# Patient Record
Sex: Female | Born: 1955 | Race: Black or African American | Hispanic: No | Marital: Married | Smoking: Former smoker
Health system: Southern US, Community
[De-identification: ages and names within clinical notes are randomized; demographics above are authoritative.]

## PROBLEM LIST (undated history)

## (undated) DIAGNOSIS — D649 Anemia, unspecified: Secondary | ICD-10-CM

## (undated) DIAGNOSIS — Z992 Dependence on renal dialysis: Secondary | ICD-10-CM

## (undated) DIAGNOSIS — T829XXA Unspecified complication of cardiac and vascular prosthetic device, implant and graft, initial encounter: Secondary | ICD-10-CM

## (undated) DIAGNOSIS — I509 Heart failure, unspecified: Secondary | ICD-10-CM

## (undated) DIAGNOSIS — K922 Gastrointestinal hemorrhage, unspecified: Secondary | ICD-10-CM

## (undated) DIAGNOSIS — J40 Bronchitis, not specified as acute or chronic: Secondary | ICD-10-CM

## (undated) DIAGNOSIS — E785 Hyperlipidemia, unspecified: Secondary | ICD-10-CM

## (undated) DIAGNOSIS — E669 Obesity, unspecified: Secondary | ICD-10-CM

## (undated) DIAGNOSIS — J811 Chronic pulmonary edema: Secondary | ICD-10-CM

## (undated) DIAGNOSIS — E119 Type 2 diabetes mellitus without complications: Secondary | ICD-10-CM

## (undated) DIAGNOSIS — F32A Depression, unspecified: Secondary | ICD-10-CM

## (undated) DIAGNOSIS — R4182 Altered mental status, unspecified: Secondary | ICD-10-CM

## (undated) DIAGNOSIS — G4730 Sleep apnea, unspecified: Secondary | ICD-10-CM

## (undated) DIAGNOSIS — J9690 Respiratory failure, unspecified, unspecified whether with hypoxia or hypercapnia: Secondary | ICD-10-CM

## (undated) DIAGNOSIS — S0083XA Contusion of other part of head, initial encounter: Secondary | ICD-10-CM

## (undated) DIAGNOSIS — R519 Headache, unspecified: Secondary | ICD-10-CM

## (undated) DIAGNOSIS — I10 Essential (primary) hypertension: Secondary | ICD-10-CM

## (undated) DIAGNOSIS — N289 Disorder of kidney and ureter, unspecified: Secondary | ICD-10-CM

## (undated) DIAGNOSIS — N189 Chronic kidney disease, unspecified: Secondary | ICD-10-CM

## (undated) DIAGNOSIS — F329 Major depressive disorder, single episode, unspecified: Secondary | ICD-10-CM

## (undated) DIAGNOSIS — K219 Gastro-esophageal reflux disease without esophagitis: Secondary | ICD-10-CM

## (undated) DIAGNOSIS — M1990 Unspecified osteoarthritis, unspecified site: Secondary | ICD-10-CM

## (undated) DIAGNOSIS — J449 Chronic obstructive pulmonary disease, unspecified: Secondary | ICD-10-CM

## (undated) DIAGNOSIS — R51 Headache: Secondary | ICD-10-CM

## (undated) HISTORY — DX: Chronic kidney disease, unspecified: N18.9

## (undated) HISTORY — DX: Chronic pulmonary edema: J81.1

## (undated) HISTORY — DX: Gastrointestinal hemorrhage, unspecified: K92.2

## (undated) HISTORY — DX: Hyperlipidemia, unspecified: E78.5

## (undated) HISTORY — DX: Respiratory failure, unspecified, unspecified whether with hypoxia or hypercapnia: J96.90

## (undated) HISTORY — DX: Contusion of other part of head, initial encounter: S00.83XA

## (undated) HISTORY — DX: Chronic obstructive pulmonary disease, unspecified: J44.9

## (undated) HISTORY — DX: Depression, unspecified: F32.A

## (undated) HISTORY — DX: Altered mental status, unspecified: R41.82

## (undated) HISTORY — DX: Dependence on renal dialysis: Z99.2

## (undated) HISTORY — PX: DIALYSIS FISTULA CREATION: SHX611

## (undated) HISTORY — DX: Essential (primary) hypertension: I10

## (undated) HISTORY — PX: ABDOMINAL HYSTERECTOMY: SHX81

## (undated) HISTORY — DX: Major depressive disorder, single episode, unspecified: F32.9

## (undated) HISTORY — DX: Type 2 diabetes mellitus without complications: E11.9

---

## 1990-02-14 DIAGNOSIS — N859 Noninflammatory disorder of uterus, unspecified: Secondary | ICD-10-CM

## 2004-09-25 ENCOUNTER — Emergency Department: Payer: Self-pay | Admitting: Emergency Medicine

## 2004-12-05 ENCOUNTER — Emergency Department: Payer: Self-pay | Admitting: Emergency Medicine

## 2008-04-20 ENCOUNTER — Emergency Department: Payer: Self-pay | Admitting: Emergency Medicine

## 2009-06-21 ENCOUNTER — Emergency Department: Payer: Self-pay | Admitting: Emergency Medicine

## 2009-06-29 ENCOUNTER — Emergency Department: Payer: Self-pay | Admitting: Emergency Medicine

## 2009-09-21 DIAGNOSIS — E119 Type 2 diabetes mellitus without complications: Secondary | ICD-10-CM

## 2009-09-21 HISTORY — DX: Type 2 diabetes mellitus without complications: E11.9

## 2009-10-18 DIAGNOSIS — Z87891 Personal history of nicotine dependence: Secondary | ICD-10-CM

## 2009-10-18 DIAGNOSIS — N189 Chronic kidney disease, unspecified: Secondary | ICD-10-CM

## 2009-11-26 HISTORY — PX: COLONOSCOPY: SHX174

## 2010-07-20 ENCOUNTER — Emergency Department: Payer: Self-pay | Admitting: Emergency Medicine

## 2010-10-13 ENCOUNTER — Inpatient Hospital Stay: Payer: Self-pay | Admitting: Internal Medicine

## 2010-10-13 ENCOUNTER — Ambulatory Visit: Payer: Self-pay | Admitting: Cardiovascular Disease

## 2011-03-14 ENCOUNTER — Emergency Department: Payer: Self-pay | Admitting: Emergency Medicine

## 2011-09-09 ENCOUNTER — Inpatient Hospital Stay: Payer: Self-pay | Admitting: Specialist

## 2012-11-26 DIAGNOSIS — Z992 Dependence on renal dialysis: Secondary | ICD-10-CM

## 2012-11-26 HISTORY — DX: Dependence on renal dialysis: Z99.2

## 2013-02-27 ENCOUNTER — Inpatient Hospital Stay: Payer: Self-pay | Admitting: Internal Medicine

## 2013-02-27 LAB — CBC WITH DIFFERENTIAL/PLATELET
Basophil #: 0 10*3/uL (ref 0.0–0.1)
Basophil %: 0.5 %
Eosinophil %: 2.3 %
Lymphocyte #: 1.5 10*3/uL (ref 1.0–3.6)
MCH: 30.1 pg (ref 26.0–34.0)
MCV: 90 fL (ref 80–100)
Monocyte #: 0.7 x10 3/mm (ref 0.2–0.9)
Monocyte %: 8.2 %
Neutrophil %: 70.4 %
RBC: 2.66 10*6/uL — ABNORMAL LOW (ref 3.80–5.20)
RDW: 13.5 % (ref 11.5–14.5)
WBC: 8.3 10*3/uL (ref 3.6–11.0)

## 2013-02-27 LAB — TROPONIN I: Troponin-I: 0.04 ng/mL

## 2013-02-27 LAB — IRON AND TIBC
Iron Bind.Cap.(Total): 331 ug/dL (ref 250–450)
Iron: 35 ug/dL — ABNORMAL LOW (ref 50–170)
Unbound Iron-Bind.Cap.: 296 ug/dL

## 2013-02-27 LAB — TSH: Thyroid Stimulating Horm: 0.186 u[IU]/mL — ABNORMAL LOW

## 2013-02-27 LAB — COMPREHENSIVE METABOLIC PANEL
Albumin: 3.6 g/dL (ref 3.4–5.0)
Anion Gap: 11 (ref 7–16)
BUN: 74 mg/dL — ABNORMAL HIGH (ref 7–18)
Calcium, Total: 5.7 mg/dL — CL (ref 8.5–10.1)
Chloride: 101 mmol/L (ref 98–107)
Co2: 27 mmol/L (ref 21–32)
EGFR (Non-African Amer.): 4 — ABNORMAL LOW
Glucose: 104 mg/dL — ABNORMAL HIGH (ref 65–99)
Osmolality: 300 (ref 275–301)
SGOT(AST): 19 U/L (ref 15–37)
Total Protein: 7.5 g/dL (ref 6.4–8.2)

## 2013-02-27 LAB — LIPID PANEL
Cholesterol: 180 mg/dL (ref 0–200)
HDL Cholesterol: 37 mg/dL — ABNORMAL LOW (ref 40–60)
Ldl Cholesterol, Calc: 123 mg/dL — ABNORMAL HIGH (ref 0–100)
Triglycerides: 101 mg/dL (ref 0–200)
VLDL Cholesterol, Calc: 20 mg/dL (ref 5–40)

## 2013-02-27 LAB — URINALYSIS, COMPLETE
Ketone: NEGATIVE
Protein: 30
Squamous Epithelial: 2
WBC UR: 27 /HPF (ref 0–5)

## 2013-02-27 LAB — PHOSPHORUS: Phosphorus: 5.5 mg/dL — ABNORMAL HIGH (ref 2.5–4.9)

## 2013-02-27 LAB — FOLATE: Folic Acid: 5.6 ng/mL (ref 3.1–100.0)

## 2013-02-27 LAB — FERRITIN: Ferritin (ARMC): 51 ng/mL (ref 8–388)

## 2013-02-28 LAB — RENAL FUNCTION PANEL
Albumin: 3.5 g/dL (ref 3.4–5.0)
Anion Gap: 8 (ref 7–16)
Calcium, Total: 7.1 mg/dL — ABNORMAL LOW (ref 8.5–10.1)
Co2: 28 mmol/L (ref 21–32)
EGFR (African American): 6 — ABNORMAL LOW
EGFR (Non-African Amer.): 5 — ABNORMAL LOW
Potassium: 3.3 mmol/L — ABNORMAL LOW (ref 3.5–5.1)

## 2013-02-28 LAB — CBC WITH DIFFERENTIAL/PLATELET
Basophil %: 0.7 %
Eosinophil #: 0.2 10*3/uL (ref 0.0–0.7)
Eosinophil #: 0.1 10*3/uL (ref 0.0–0.7)
Eosinophil %: 2.1 %
HCT: 24.6 % — ABNORMAL LOW (ref 35.0–47.0)
HCT: 22.9 % — ABNORMAL LOW (ref 35.0–47.0)
HGB: 7.6 g/dL — ABNORMAL LOW (ref 12.0–16.0)
Lymphocyte #: 1.1 10*3/uL (ref 1.0–3.6)
Lymphocyte %: 13.9 %
Lymphocyte %: 15.2 %
MCH: 30.4 pg (ref 26.0–34.0)
MCH: 30.2 pg (ref 26.0–34.0)
MCHC: 33.1 g/dL (ref 32.0–36.0)
MCV: 92 fL (ref 80–100)
Monocyte #: 0.6 x10 3/mm (ref 0.2–0.9)
Neutrophil #: 5.2 10*3/uL (ref 1.4–6.5)
Neutrophil #: 5.2 10*3/uL (ref 1.4–6.5)
Neutrophil %: 73.8 %
Neutrophil %: 74.1 %
Platelet: 285 10*3/uL (ref 150–440)
Platelet: 261 10*3/uL (ref 150–440)
RBC: 2.51 10*6/uL — ABNORMAL LOW (ref 3.80–5.20)
RDW: 13.6 % (ref 11.5–14.5)
WBC: 7 10*3/uL (ref 3.6–11.0)
WBC: 7 10*3/uL (ref 3.6–11.0)

## 2013-02-28 LAB — LIPID PANEL
Cholesterol: 192 mg/dL (ref 0–200)
HDL Cholesterol: 40 mg/dL (ref 40–60)
Ldl Cholesterol, Calc: 132 mg/dL — ABNORMAL HIGH (ref 0–100)
Triglycerides: 98 mg/dL (ref 0–200)
VLDL Cholesterol, Calc: 20 mg/dL (ref 5–40)

## 2013-02-28 LAB — BASIC METABOLIC PANEL
Anion Gap: 8 (ref 7–16)
BUN: 53 mg/dL — ABNORMAL HIGH (ref 7–18)
Calcium, Total: 7.2 mg/dL — ABNORMAL LOW (ref 8.5–10.1)
Co2: 28 mmol/L (ref 21–32)
Creatinine: 7.24 mg/dL — ABNORMAL HIGH (ref 0.60–1.30)
EGFR (Non-African Amer.): 6 — ABNORMAL LOW
Glucose: 102 mg/dL — ABNORMAL HIGH (ref 65–99)
Osmolality: 296 (ref 275–301)
Potassium: 3.6 mmol/L (ref 3.5–5.1)
Sodium: 141 mmol/L (ref 136–145)

## 2013-02-28 LAB — HEMOGLOBIN A1C: Hemoglobin A1C: 5.4 % (ref 4.2–6.3)

## 2013-03-01 LAB — CBC WITH DIFFERENTIAL/PLATELET
Basophil #: 0.1 10*3/uL (ref 0.0–0.1)
Basophil %: 1 %
Eosinophil #: 0.2 10*3/uL (ref 0.0–0.7)
HGB: 7.6 g/dL — ABNORMAL LOW (ref 12.0–16.0)
Lymphocyte %: 24.4 %
MCHC: 33.2 g/dL (ref 32.0–36.0)
MCV: 92 fL (ref 80–100)
Monocyte #: 1 x10 3/mm — ABNORMAL HIGH (ref 0.2–0.9)
Neutrophil %: 60 %
WBC: 8.1 10*3/uL (ref 3.6–11.0)

## 2013-03-01 LAB — RENAL FUNCTION PANEL
BUN: 31 mg/dL — ABNORMAL HIGH (ref 7–18)
Chloride: 104 mmol/L (ref 98–107)
Co2: 29 mmol/L (ref 21–32)
Creatinine: 5.3 mg/dL — ABNORMAL HIGH (ref 0.60–1.30)
EGFR (Non-African Amer.): 8 — ABNORMAL LOW
Glucose: 101 mg/dL — ABNORMAL HIGH (ref 65–99)
Osmolality: 286 (ref 275–301)
Sodium: 140 mmol/L (ref 136–145)

## 2013-03-02 LAB — RENAL FUNCTION PANEL
Albumin: 3.4 g/dL (ref 3.4–5.0)
Anion Gap: 5 — ABNORMAL LOW (ref 7–16)
BUN: 41 mg/dL — ABNORMAL HIGH (ref 7–18)
Calcium, Total: 7.5 mg/dL — ABNORMAL LOW (ref 8.5–10.1)
Chloride: 106 mmol/L (ref 98–107)
Co2: 29 mmol/L (ref 21–32)
Creatinine: 7.03 mg/dL — ABNORMAL HIGH (ref 0.60–1.30)
EGFR (African American): 7 — ABNORMAL LOW
Phosphorus: 4.5 mg/dL (ref 2.5–4.9)
Potassium: 3.9 mmol/L (ref 3.5–5.1)
Sodium: 140 mmol/L (ref 136–145)

## 2013-03-03 LAB — PROTIME-INR: Prothrombin Time: 13.9 secs (ref 11.5–14.7)

## 2013-03-23 ENCOUNTER — Ambulatory Visit: Payer: Self-pay | Admitting: Vascular Surgery

## 2013-03-23 LAB — BASIC METABOLIC PANEL WITH GFR
Anion Gap: 10 (ref 7–16)
BUN: 32 mg/dL — ABNORMAL HIGH (ref 7–18)
Calcium, Total: 8.3 mg/dL — ABNORMAL LOW (ref 8.5–10.1)
Chloride: 101 mmol/L (ref 98–107)
Co2: 26 mmol/L (ref 21–32)
Creatinine: 7.5 mg/dL — ABNORMAL HIGH (ref 0.60–1.30)
EGFR (African American): 6 — ABNORMAL LOW
EGFR (Non-African Amer.): 5 — ABNORMAL LOW
Glucose: 102 mg/dL — ABNORMAL HIGH (ref 65–99)
Osmolality: 281 (ref 275–301)
Potassium: 3.7 mmol/L (ref 3.5–5.1)
Sodium: 137 mmol/L (ref 136–145)

## 2013-03-23 LAB — CBC
HGB: 9.3 g/dL — ABNORMAL LOW (ref 12.0–16.0)
MCH: 30 pg (ref 26.0–34.0)
MCHC: 33 g/dL (ref 32.0–36.0)
Platelet: 361 10*3/uL (ref 150–440)
RDW: 14.1 % (ref 11.5–14.5)
WBC: 6.1 10*3/uL (ref 3.6–11.0)

## 2013-03-27 ENCOUNTER — Ambulatory Visit: Payer: Self-pay | Admitting: Vascular Surgery

## 2013-05-04 ENCOUNTER — Emergency Department: Payer: Self-pay | Admitting: Emergency Medicine

## 2013-05-04 LAB — CBC
MCH: 30.6 pg (ref 26.0–34.0)
Platelet: 323 10*3/uL (ref 150–440)
RDW: 15.5 % — ABNORMAL HIGH (ref 11.5–14.5)
WBC: 6.7 10*3/uL (ref 3.6–11.0)

## 2013-05-04 LAB — COMPREHENSIVE METABOLIC PANEL
Alkaline Phosphatase: 253 U/L — ABNORMAL HIGH (ref 50–136)
Anion Gap: 11 (ref 7–16)
BUN: 58 mg/dL — ABNORMAL HIGH (ref 7–18)
Bilirubin,Total: 0.4 mg/dL (ref 0.2–1.0)
Calcium, Total: 8.2 mg/dL — ABNORMAL LOW (ref 8.5–10.1)
Chloride: 104 mmol/L (ref 98–107)
EGFR (African American): 6 — ABNORMAL LOW
EGFR (Non-African Amer.): 6 — ABNORMAL LOW
Osmolality: 295 (ref 275–301)
SGOT(AST): 16 U/L (ref 15–37)
Total Protein: 7 g/dL (ref 6.4–8.2)

## 2013-05-04 LAB — TROPONIN I
Troponin-I: 0.02 ng/mL
Troponin-I: 0.02 ng/mL

## 2013-05-04 LAB — URINALYSIS, COMPLETE
Ketone: NEGATIVE
Ph: 7 (ref 4.5–8.0)
Protein: 30
Squamous Epithelial: 14

## 2013-05-11 ENCOUNTER — Other Ambulatory Visit: Payer: Self-pay | Admitting: Internal Medicine

## 2013-05-11 LAB — LIPASE, BLOOD: Lipase: 307 U/L (ref 73–393)

## 2013-05-15 ENCOUNTER — Ambulatory Visit: Payer: Self-pay | Admitting: Physician Assistant

## 2013-06-02 LAB — COMPREHENSIVE METABOLIC PANEL
Alkaline Phosphatase: 189 U/L — ABNORMAL HIGH (ref 50–136)
Anion Gap: 8 (ref 7–16)
Bilirubin,Total: 0.6 mg/dL (ref 0.2–1.0)
Creatinine: 4 mg/dL — ABNORMAL HIGH (ref 0.60–1.30)
EGFR (African American): 13 — ABNORMAL LOW
Osmolality: 274 (ref 275–301)
SGOT(AST): 31 U/L (ref 15–37)
SGPT (ALT): 20 U/L (ref 12–78)
Sodium: 136 mmol/L (ref 136–145)
Total Protein: 8.1 g/dL (ref 6.4–8.2)

## 2013-06-02 LAB — CK TOTAL AND CKMB (NOT AT ARMC)
CK, Total: 49 U/L (ref 21–215)
CK-MB: 1.1 ng/mL (ref 0.5–3.6)

## 2013-06-02 LAB — CBC
HCT: 36.6 % (ref 35.0–47.0)
MCH: 29.5 pg (ref 26.0–34.0)
MCHC: 31.4 g/dL — ABNORMAL LOW (ref 32.0–36.0)
MCV: 94 fL (ref 80–100)
Platelet: 279 10*3/uL (ref 150–440)
RBC: 3.91 10*6/uL (ref 3.80–5.20)
WBC: 8.7 10*3/uL (ref 3.6–11.0)

## 2013-06-02 LAB — TROPONIN I: Troponin-I: 0.02 ng/mL

## 2013-06-02 LAB — PRO B NATRIURETIC PEPTIDE: B-Type Natriuretic Peptide: 48946 pg/mL — ABNORMAL HIGH (ref 0–125)

## 2013-06-03 ENCOUNTER — Inpatient Hospital Stay: Payer: Self-pay | Admitting: Family Medicine

## 2013-06-03 DIAGNOSIS — I509 Heart failure, unspecified: Secondary | ICD-10-CM

## 2013-06-04 LAB — BASIC METABOLIC PANEL
Co2: 25 mmol/L (ref 21–32)
Creatinine: 7.85 mg/dL — ABNORMAL HIGH (ref 0.60–1.30)
EGFR (Non-African Amer.): 5 — ABNORMAL LOW
Glucose: 143 mg/dL — ABNORMAL HIGH (ref 65–99)
Osmolality: 291 (ref 275–301)
Sodium: 136 mmol/L (ref 136–145)

## 2013-06-04 LAB — PRO B NATRIURETIC PEPTIDE: B-Type Natriuretic Peptide: 33950 pg/mL — ABNORMAL HIGH (ref 0–125)

## 2013-06-04 LAB — URINALYSIS, COMPLETE
Bilirubin,UR: NEGATIVE
Glucose,UR: >=500 mg/dL (ref 0–75)
Nitrite: NEGATIVE
Ph: 6 (ref 4.5–8.0)
RBC,UR: 6 /HPF (ref 0–5)
Specific Gravity: 1.006 (ref 1.003–1.030)

## 2013-06-04 LAB — PHOSPHORUS: Phosphorus: 3.3 mg/dL (ref 2.5–4.9)

## 2013-06-07 ENCOUNTER — Inpatient Hospital Stay: Payer: Self-pay | Admitting: Internal Medicine

## 2013-06-07 LAB — BASIC METABOLIC PANEL
Anion Gap: 5 — ABNORMAL LOW (ref 7–16)
BUN: 51 mg/dL — ABNORMAL HIGH (ref 7–18)
EGFR (African American): 7 — ABNORMAL LOW
EGFR (Non-African Amer.): 6 — ABNORMAL LOW
Glucose: 145 mg/dL — ABNORMAL HIGH (ref 65–99)
Osmolality: 294 (ref 275–301)
Sodium: 139 mmol/L (ref 136–145)

## 2013-06-07 LAB — COMPREHENSIVE METABOLIC PANEL
Alkaline Phosphatase: 228 U/L — ABNORMAL HIGH (ref 50–136)
Anion Gap: 13 (ref 7–16)
BUN: 45 mg/dL — ABNORMAL HIGH (ref 7–18)
Bilirubin,Total: 0.4 mg/dL (ref 0.2–1.0)
Calcium, Total: 7.7 mg/dL — ABNORMAL LOW (ref 8.5–10.1)
Chloride: 101 mmol/L (ref 98–107)
Creatinine: 6.06 mg/dL — ABNORMAL HIGH (ref 0.60–1.30)
EGFR (African American): 8 — ABNORMAL LOW
Potassium: 7.7 mmol/L (ref 3.5–5.1)
SGOT(AST): 46 U/L — ABNORMAL HIGH (ref 15–37)
SGPT (ALT): 33 U/L (ref 12–78)
Total Protein: 6.9 g/dL (ref 6.4–8.2)

## 2013-06-07 LAB — CBC WITH DIFFERENTIAL/PLATELET
Basophil #: 0.1 10*3/uL (ref 0.0–0.1)
Basophil %: 0.7 %
Eosinophil %: 0 %
HCT: 34.1 % — ABNORMAL LOW (ref 35.0–47.0)
HGB: 11 g/dL — ABNORMAL LOW (ref 12.0–16.0)
Lymphocyte #: 0.8 10*3/uL — ABNORMAL LOW (ref 1.0–3.6)
Lymphocyte %: 7.1 %
MCHC: 32.2 g/dL (ref 32.0–36.0)
MCV: 94 fL (ref 80–100)
Monocyte #: 0.7 x10 3/mm (ref 0.2–0.9)
Monocyte %: 5.9 %
Platelet: 278 10*3/uL (ref 150–440)
RBC: 3.62 10*6/uL — ABNORMAL LOW (ref 3.80–5.20)
RDW: 16.6 % — ABNORMAL HIGH (ref 11.5–14.5)

## 2013-06-07 LAB — CBC
HCT: 37.4 % (ref 35.0–47.0)
HGB: 12 g/dL (ref 12.0–16.0)
MCH: 30.4 pg (ref 26.0–34.0)
MCV: 95 fL (ref 80–100)
Platelet: 258 10*3/uL (ref 150–440)
RBC: 3.95 10*6/uL (ref 3.80–5.20)
RDW: 16.7 % — ABNORMAL HIGH (ref 11.5–14.5)

## 2013-06-07 LAB — PHOSPHORUS: Phosphorus: 3.8 mg/dL (ref 2.5–4.9)

## 2013-06-24 ENCOUNTER — Ambulatory Visit: Payer: Self-pay | Admitting: Vascular Surgery

## 2013-06-24 LAB — COMPREHENSIVE METABOLIC PANEL
Alkaline Phosphatase: 154 U/L — ABNORMAL HIGH (ref 50–136)
Anion Gap: 6 — ABNORMAL LOW (ref 7–16)
Bilirubin,Total: 0.4 mg/dL (ref 0.2–1.0)
Calcium, Total: 8.1 mg/dL — ABNORMAL LOW (ref 8.5–10.1)
Co2: 30 mmol/L (ref 21–32)
Creatinine: 6.5 mg/dL — ABNORMAL HIGH (ref 0.60–1.30)
EGFR (Non-African Amer.): 6 — ABNORMAL LOW
Glucose: 98 mg/dL (ref 65–99)
Potassium: 4.3 mmol/L (ref 3.5–5.1)
SGOT(AST): 14 U/L — ABNORMAL LOW (ref 15–37)
SGPT (ALT): 13 U/L (ref 12–78)
Sodium: 139 mmol/L (ref 136–145)
Total Protein: 6.6 g/dL (ref 6.4–8.2)

## 2013-06-24 LAB — AMYLASE: Amylase: 68 U/L (ref 25–115)

## 2013-06-24 LAB — LIPASE, BLOOD: Lipase: 242 U/L (ref 73–393)

## 2013-09-09 ENCOUNTER — Ambulatory Visit: Payer: Self-pay | Admitting: Vascular Surgery

## 2013-09-14 ENCOUNTER — Emergency Department: Payer: Self-pay | Admitting: Emergency Medicine

## 2013-09-14 LAB — LIPASE, BLOOD: Lipase: 330 U/L (ref 73–393)

## 2013-09-14 LAB — HEPATIC FUNCTION PANEL A (ARMC)
Alkaline Phosphatase: 150 U/L — ABNORMAL HIGH (ref 50–136)
Bilirubin,Total: 0.5 mg/dL (ref 0.2–1.0)
SGOT(AST): 23 U/L (ref 15–37)
SGPT (ALT): 15 U/L (ref 12–78)

## 2013-09-14 LAB — BASIC METABOLIC PANEL
Anion Gap: 7 (ref 7–16)
BUN: 49 mg/dL — ABNORMAL HIGH (ref 7–18)
Chloride: 95 mmol/L — ABNORMAL LOW (ref 98–107)
Creatinine: 8.35 mg/dL — ABNORMAL HIGH (ref 0.60–1.30)
EGFR (Non-African Amer.): 5 — ABNORMAL LOW
Glucose: 108 mg/dL — ABNORMAL HIGH (ref 65–99)
Potassium: 4.7 mmol/L (ref 3.5–5.1)
Sodium: 136 mmol/L (ref 136–145)

## 2013-09-14 LAB — CBC
HGB: 11.7 g/dL — ABNORMAL LOW (ref 12.0–16.0)
MCHC: 34.5 g/dL (ref 32.0–36.0)
MCV: 96 fL (ref 80–100)
RBC: 3.54 10*6/uL — ABNORMAL LOW (ref 3.80–5.20)
RDW: 14.8 % — ABNORMAL HIGH (ref 11.5–14.5)

## 2013-09-28 ENCOUNTER — Inpatient Hospital Stay: Payer: Self-pay | Admitting: Internal Medicine

## 2013-09-28 LAB — BASIC METABOLIC PANEL
BUN: 60 mg/dL — ABNORMAL HIGH (ref 7–18)
Chloride: 99 mmol/L (ref 98–107)
EGFR (Non-African Amer.): 4 — ABNORMAL LOW
Glucose: 95 mg/dL (ref 65–99)
Potassium: 5.3 mmol/L — ABNORMAL HIGH (ref 3.5–5.1)

## 2013-09-28 LAB — TROPONIN I: Troponin-I: 0.03 ng/mL

## 2013-09-28 LAB — CBC
MCH: 32.9 pg (ref 26.0–34.0)
MCHC: 34 g/dL (ref 32.0–36.0)
Platelet: 223 10*3/uL (ref 150–440)
RDW: 14.7 % — ABNORMAL HIGH (ref 11.5–14.5)
WBC: 10.9 10*3/uL (ref 3.6–11.0)

## 2013-09-29 LAB — CBC WITH DIFFERENTIAL/PLATELET
Basophil #: 0 10*3/uL (ref 0.0–0.1)
Basophil %: 0.4 %
Eosinophil %: 0 %
HGB: 11.5 g/dL — ABNORMAL LOW (ref 12.0–16.0)
Lymphocyte #: 0.4 10*3/uL — ABNORMAL LOW (ref 1.0–3.6)
Lymphocyte %: 4.1 %
MCH: 32.6 pg (ref 26.0–34.0)
MCHC: 33.4 g/dL (ref 32.0–36.0)
Monocyte #: 0.2 x10 3/mm (ref 0.2–0.9)
Monocyte %: 2.5 %
Neutrophil #: 8.2 10*3/uL — ABNORMAL HIGH (ref 1.4–6.5)
Neutrophil %: 93 %
Platelet: 238 10*3/uL (ref 150–440)
RBC: 3.51 10*6/uL — ABNORMAL LOW (ref 3.80–5.20)
WBC: 8.8 10*3/uL (ref 3.6–11.0)

## 2013-09-29 LAB — BASIC METABOLIC PANEL
Anion Gap: 6 — ABNORMAL LOW (ref 7–16)
Calcium, Total: 7.8 mg/dL — ABNORMAL LOW (ref 8.5–10.1)
Co2: 33 mmol/L — ABNORMAL HIGH (ref 21–32)
EGFR (African American): 8 — ABNORMAL LOW
EGFR (Non-African Amer.): 7 — ABNORMAL LOW
Glucose: 210 mg/dL — ABNORMAL HIGH (ref 65–99)
Osmolality: 288 (ref 275–301)

## 2013-09-30 LAB — PHOSPHORUS: Phosphorus: 5.3 mg/dL — ABNORMAL HIGH (ref 2.5–4.9)

## 2013-10-01 ENCOUNTER — Encounter: Payer: Self-pay | Admitting: *Deleted

## 2013-10-27 ENCOUNTER — Encounter: Payer: Self-pay | Admitting: General Surgery

## 2013-10-27 ENCOUNTER — Ambulatory Visit (INDEPENDENT_AMBULATORY_CARE_PROVIDER_SITE_OTHER): Payer: Medicare Other | Admitting: General Surgery

## 2013-10-27 VITALS — BP 150/78 | HR 70 | Resp 14 | Ht 65.0 in | Wt 153.0 lb

## 2013-10-27 DIAGNOSIS — B079 Viral wart, unspecified: Secondary | ICD-10-CM

## 2013-10-27 NOTE — Patient Instructions (Signed)
Patient to return to have a excision .

## 2013-10-27 NOTE — Progress Notes (Signed)
Patient ID: Michele Nash, female   DOB: [birth year], 57 y.o.   MRN: [id number]  Chief Complaint  Patient presents with  . Other    skin overgrowth    HPI Michele Nash is a 57 y.o. female who presents for an evaluation of a skin overgrowth on the rectum. The patient states she noticed this problem approximately 2 months ago. She states it has gotten larger. She has soreness in the area when she has to wipe a lot. Patient is currently on dialysis    HPI  Past Medical History  Diagnosis Date  . Chronic kidney disease   . COPD (chronic obstructive pulmonary disease)   . Hypertension   . Hyperlipidemia   . Depression   . Dialysis patient 2014    Past Surgical History  Procedure Laterality Date  . Abdominal hysterectomy    . Dialysis fistula creation    . Colonoscopy  2011    Family History  Problem Relation Age of Onset  . Heart disease Mother   . Cancer Father   . Cancer Sister     Social History History  Substance Use Topics  . Smoking status: Current Every Day Smoker -- 0.50 packs/day  . Smokeless tobacco: Not on file  . Alcohol Use: No    Allergies  Allergen Reactions  . Sulfa Antibiotics Swelling    Current Outpatient Prescriptions  Medication Sig Dispense Refill  . calcium carbonate (OS-CAL) 600 MG TABS tablet Take 600 mg by mouth 2 (two) times daily with a meal.      . carvedilol (COREG) 12.5 MG tablet Take 12.5 mg by mouth 2 (two) times daily with a meal.      . cinacalcet (SENSIPAR) 30 MG tablet Take 30 mg by mouth daily.      Marland Kitchen ezetimibe (ZETIA) 10 MG tablet Take 10 mg by mouth daily.      . fluticasone (VERAMYST) 27.5 MCG/SPRAY nasal spray Place 2 sprays into the nose daily.      . furosemide (LASIX) 40 MG tablet Take 40 mg by mouth.      . losartan (COZAAR) 100 MG tablet Take 100 mg by mouth daily.      Marland Kitchen omeprazole (PRILOSEC) 20 MG capsule Take 20 mg by mouth daily.      . sertraline (ZOLOFT) 50 MG tablet Take 50 mg by mouth daily.       No  current facility-administered medications for this visit.    Review of Systems Review of Systems  Constitutional: Negative.   Respiratory: Negative.   Cardiovascular: Negative.   Gastrointestinal: Negative.     Blood pressure 150/78, pulse 70, resp. rate 14, height 5\' 5"  (1.651 m), weight 153 lb (69.4 kg).  Physical Exam Physical Exam  Constitutional: She is oriented to person, place, and time. She appears well-developed and well-nourished.  Eyes: No scleral icterus.  Cardiovascular: Normal rate, regular rhythm and normal heart sounds.   Pulmonary/Chest: Breath sounds normal.  Abdominal: Soft. Normal appearance and bowel sounds are normal. There is no tenderness.  Genitourinary:  There are three warty skin tags-one 1cm and the other two  are 0.5 cm. No signs of infection.  Lymphadenopathy:    She has no cervical adenopathy.  Neurological: She is alert and oriented to person, place, and time.  Skin: Skin is warm and dry.    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 Reasons explained to her and she is agreeable. Also plan for anoscopy at same time.       Lilit Cinelli G 10/27/2013, 12:45 PM

## 2013-11-06 ENCOUNTER — Inpatient Hospital Stay: Payer: Self-pay | Admitting: Internal Medicine

## 2013-11-06 LAB — CBC WITH DIFFERENTIAL/PLATELET
Basophil %: 0.7 %
Eosinophil #: 0.2 10*3/uL (ref 0.0–0.7)
HCT: 38.5 % (ref 35.0–47.0)
Lymphocyte %: 14.5 %
MCHC: 32.6 g/dL (ref 32.0–36.0)
Monocyte #: 0.8 x10 3/mm (ref 0.2–0.9)
Monocyte %: 9.1 %
RDW: 15 % — ABNORMAL HIGH (ref 11.5–14.5)
WBC: 8.6 10*3/uL (ref 3.6–11.0)

## 2013-11-06 LAB — CK TOTAL AND CKMB (NOT AT ARMC)
CK, Total: 139 U/L (ref 21–215)
CK-MB: 1.4 ng/mL (ref 0.5–3.6)
CK-MB: 1 ng/mL (ref 0.5–3.6)

## 2013-11-06 LAB — COMPREHENSIVE METABOLIC PANEL
Albumin: 3.6 g/dL (ref 3.4–5.0)
Anion Gap: 7 (ref 7–16)
BUN: 69 mg/dL — ABNORMAL HIGH (ref 7–18)
Bilirubin,Total: 0.4 mg/dL (ref 0.2–1.0)
Calcium, Total: 8.6 mg/dL (ref 8.5–10.1)
Chloride: 93 mmol/L — ABNORMAL LOW (ref 98–107)
Co2: 38 mmol/L — ABNORMAL HIGH (ref 21–32)
Creatinine: 8.87 mg/dL — ABNORMAL HIGH (ref 0.60–1.30)
EGFR (Non-African Amer.): 4 — ABNORMAL LOW
Glucose: 130 mg/dL — ABNORMAL HIGH (ref 65–99)
Osmolality: 298 (ref 275–301)
Sodium: 138 mmol/L (ref 136–145)

## 2013-11-06 LAB — TROPONIN I
Troponin-I: 0.07 ng/mL — ABNORMAL HIGH
Troponin-I: 0.07 ng/mL — ABNORMAL HIGH

## 2013-11-06 LAB — PHOSPHORUS: Phosphorus: 5 mg/dL — ABNORMAL HIGH (ref 2.5–4.9)

## 2013-11-06 LAB — PRO B NATRIURETIC PEPTIDE: B-Type Natriuretic Peptide: 10415 pg/mL — ABNORMAL HIGH (ref 0–125)

## 2013-11-07 LAB — BASIC METABOLIC PANEL
EGFR (African American): 8 — ABNORMAL LOW
Glucose: 151 mg/dL — ABNORMAL HIGH (ref 65–99)
Osmolality: 277 (ref 275–301)
Potassium: 3.9 mmol/L (ref 3.5–5.1)

## 2013-11-07 LAB — CBC WITH DIFFERENTIAL/PLATELET
Basophil #: 0.1 10*3/uL (ref 0.0–0.1)
Basophil %: 1.1 %
Eosinophil #: 0.2 10*3/uL (ref 0.0–0.7)
HGB: 11.6 g/dL — ABNORMAL LOW (ref 12.0–16.0)
Lymphocyte #: 1.6 10*3/uL (ref 1.0–3.6)
MCH: 31.8 pg (ref 26.0–34.0)
MCHC: 32.4 g/dL (ref 32.0–36.0)
MCV: 98 fL (ref 80–100)
Monocyte #: 0.9 x10 3/mm (ref 0.2–0.9)
Neutrophil #: 4.7 10*3/uL (ref 1.4–6.5)
Neutrophil %: 63.4 %
Platelet: 315 10*3/uL (ref 150–440)
RBC: 3.64 10*6/uL — ABNORMAL LOW (ref 3.80–5.20)
RDW: 15 % — ABNORMAL HIGH (ref 11.5–14.5)
WBC: 7.5 10*3/uL (ref 3.6–11.0)

## 2013-11-12 ENCOUNTER — Ambulatory Visit (INDEPENDENT_AMBULATORY_CARE_PROVIDER_SITE_OTHER): Payer: Medicare Other | Admitting: General Surgery

## 2013-11-12 ENCOUNTER — Encounter: Payer: Self-pay | Admitting: General Surgery

## 2013-11-12 VITALS — BP 140/100 | HR 76 | Resp 16 | Ht 65.0 in | Wt 155.0 lb

## 2013-11-12 DIAGNOSIS — B079 Viral wart, unspecified: Secondary | ICD-10-CM

## 2013-11-12 NOTE — Progress Notes (Signed)
Patient presents for excision of perianal warts and anoscopy.   Patient was placed in lithotomy position. Endoscopy was completed showing no evidence of warts or other lesions within the anal canal. The perianal area was prepped with Betadine. 3 mL of 1% Xylocaine with epi and half percent Marcaine was instilled at the base of the 3 watery lesions. All 3 were excised completely and cauterized with disposable cautery. Tissue sent to pathology. Area was dressed with gauze sponge. She tolerated the procedure without any immediate problems encountered.

## 2013-11-12 NOTE — Patient Instructions (Addendum)
Keep area clean. Patient to return in 3 weeks for follow up visit.

## 2013-11-13 LAB — PATHOLOGY

## 2013-11-16 ENCOUNTER — Encounter: Payer: Self-pay | Admitting: General Surgery

## 2013-11-17 ENCOUNTER — Telehealth: Payer: Self-pay | Admitting: *Deleted

## 2013-11-17 NOTE — Telephone Encounter (Signed)
Message copied by Currie Paris on Tue Nov 17, 2013  3:39 PM ------      Message from: Kieth Brightly      Created: Tue Nov 17, 2013  3:04 PM       Please let pt pt know the pathology was normal. ------

## 2013-11-17 NOTE — Telephone Encounter (Signed)
Notified patient as instructed, patient pleased. Discussed follow-up appointments, patient agrees  

## 2013-12-02 ENCOUNTER — Ambulatory Visit: Payer: Medicare Other | Admitting: General Surgery

## 2013-12-07 ENCOUNTER — Encounter: Payer: Self-pay | Admitting: General Surgery

## 2013-12-07 ENCOUNTER — Ambulatory Visit (INDEPENDENT_AMBULATORY_CARE_PROVIDER_SITE_OTHER): Payer: Medicare Other | Admitting: General Surgery

## 2013-12-07 VITALS — BP 130/80 | HR 76 | Resp 14 | Ht 65.0 in

## 2013-12-07 DIAGNOSIS — B079 Viral wart, unspecified: Secondary | ICD-10-CM

## 2013-12-07 NOTE — Patient Instructions (Signed)
Patient to return as needed. 

## 2013-12-07 NOTE — Progress Notes (Signed)
This is a 58  year old female following up from of perianal warts and anoscopy done on 11/12/13.Patient states she is doing well. Incision sites in the perianal skin are well healed.Path condylomata.No dysplasia or malignancy. Pt advised.

## 2013-12-10 ENCOUNTER — Institutional Professional Consult (permissible substitution): Payer: Medicaid Other | Admitting: Internal Medicine

## 2013-12-15 ENCOUNTER — Institutional Professional Consult (permissible substitution): Payer: Medicaid Other | Admitting: Internal Medicine

## 2014-03-01 ENCOUNTER — Inpatient Hospital Stay: Payer: Self-pay | Admitting: Internal Medicine

## 2014-03-01 LAB — CBC
HCT: 35.7 % (ref 35.0–47.0)
HGB: 11.7 g/dL — ABNORMAL LOW (ref 12.0–16.0)
MCH: 32.6 pg (ref 26.0–34.0)
MCHC: 32.7 g/dL (ref 32.0–36.0)
MCV: 100 fL (ref 80–100)
Platelet: 242 10*3/uL (ref 150–440)
RBC: 3.58 10*6/uL — AB (ref 3.80–5.20)
RDW: 14.1 % (ref 11.5–14.5)
WBC: 9.3 10*3/uL (ref 3.6–11.0)

## 2014-03-01 LAB — MAGNESIUM: MAGNESIUM: 2.2 mg/dL

## 2014-03-01 LAB — COMPREHENSIVE METABOLIC PANEL
ALBUMIN: 3.5 g/dL (ref 3.4–5.0)
AST: 38 U/L — AB (ref 15–37)
Alkaline Phosphatase: 173 U/L — ABNORMAL HIGH
Anion Gap: 4 — ABNORMAL LOW (ref 7–16)
BILIRUBIN TOTAL: 0.4 mg/dL (ref 0.2–1.0)
BUN: 55 mg/dL — ABNORMAL HIGH (ref 7–18)
CREATININE: 8.45 mg/dL — AB (ref 0.60–1.30)
Calcium, Total: 8.6 mg/dL (ref 8.5–10.1)
Chloride: 97 mmol/L — ABNORMAL LOW (ref 98–107)
Co2: 36 mmol/L — ABNORMAL HIGH (ref 21–32)
EGFR (African American): 5 — ABNORMAL LOW
GFR CALC NON AF AMER: 5 — AB
GLUCOSE: 133 mg/dL — AB (ref 65–99)
OSMOLALITY: 291 (ref 275–301)
POTASSIUM: 5.4 mmol/L — AB (ref 3.5–5.1)
SGPT (ALT): 28 U/L (ref 12–78)
SODIUM: 137 mmol/L (ref 136–145)
Total Protein: 7.3 g/dL (ref 6.4–8.2)

## 2014-03-01 LAB — TROPONIN I: TROPONIN-I: 0.02 ng/mL

## 2014-03-01 LAB — PHOSPHORUS: Phosphorus: 2.4 mg/dL — ABNORMAL LOW (ref 2.5–4.9)

## 2014-03-02 LAB — PHOSPHORUS: Phosphorus: 3.7 mg/dL (ref 2.5–4.9)

## 2014-03-03 LAB — RENAL FUNCTION PANEL
Albumin: 3.3 g/dL — ABNORMAL LOW (ref 3.4–5.0)
Anion Gap: 5 — ABNORMAL LOW (ref 7–16)
BUN: 28 mg/dL — ABNORMAL HIGH (ref 7–18)
CHLORIDE: 98 mmol/L (ref 98–107)
CREATININE: 5.35 mg/dL — AB (ref 0.60–1.30)
Calcium, Total: 8.1 mg/dL — ABNORMAL LOW (ref 8.5–10.1)
Co2: 32 mmol/L (ref 21–32)
EGFR (African American): 9 — ABNORMAL LOW
EGFR (Non-African Amer.): 8 — ABNORMAL LOW
Glucose: 74 mg/dL (ref 65–99)
OSMOLALITY: 274 (ref 275–301)
PHOSPHORUS: 3.2 mg/dL (ref 2.5–4.9)
Potassium: 4 mmol/L (ref 3.5–5.1)
Sodium: 135 mmol/L — ABNORMAL LOW (ref 136–145)

## 2014-03-03 LAB — CBC WITH DIFFERENTIAL/PLATELET
Basophil #: 0.1 10*3/uL (ref 0.0–0.1)
Basophil %: 0.8 %
EOS ABS: 0.4 10*3/uL (ref 0.0–0.7)
Eosinophil %: 5.6 %
HCT: 35.3 % (ref 35.0–47.0)
HGB: 11.8 g/dL — ABNORMAL LOW (ref 12.0–16.0)
Lymphocyte #: 1.4 10*3/uL (ref 1.0–3.6)
Lymphocyte %: 21.4 %
MCH: 33.2 pg (ref 26.0–34.0)
MCHC: 33.5 g/dL (ref 32.0–36.0)
MCV: 99 fL (ref 80–100)
Monocyte #: 0.9 x10 3/mm (ref 0.2–0.9)
Monocyte %: 13.7 %
NEUTROS ABS: 3.8 10*3/uL (ref 1.4–6.5)
Neutrophil %: 58.5 %
Platelet: 250 10*3/uL (ref 150–440)
RBC: 3.55 10*6/uL — ABNORMAL LOW (ref 3.80–5.20)
RDW: 14.2 % (ref 11.5–14.5)
WBC: 6.5 10*3/uL (ref 3.6–11.0)

## 2014-03-19 ENCOUNTER — Observation Stay: Payer: Self-pay | Admitting: Internal Medicine

## 2014-03-19 LAB — BASIC METABOLIC PANEL
Anion Gap: 4 — ABNORMAL LOW (ref 7–16)
BUN: 39 mg/dL — ABNORMAL HIGH (ref 7–18)
CALCIUM: 8 mg/dL — AB (ref 8.5–10.1)
CREATININE: 6.16 mg/dL — AB (ref 0.60–1.30)
Chloride: 102 mmol/L (ref 98–107)
Co2: 30 mmol/L (ref 21–32)
EGFR (African American): 8 — ABNORMAL LOW
EGFR (Non-African Amer.): 7 — ABNORMAL LOW
Glucose: 117 mg/dL — ABNORMAL HIGH (ref 65–99)
OSMOLALITY: 282 (ref 275–301)
Potassium: 3.8 mmol/L (ref 3.5–5.1)
SODIUM: 136 mmol/L (ref 136–145)

## 2014-03-19 LAB — TROPONIN I: Troponin-I: <0.02 ng/mL

## 2014-03-19 LAB — CBC
HCT: 36.1 % (ref 35.0–47.0)
HGB: 11.6 g/dL — ABNORMAL LOW (ref 12.0–16.0)
MCH: 32.4 pg (ref 26.0–34.0)
MCHC: 32.2 g/dL (ref 32.0–36.0)
MCV: 101 fL — AB (ref 80–100)
Platelet: 289 10*3/uL (ref 150–440)
RBC: 3.58 10*6/uL — ABNORMAL LOW (ref 3.80–5.20)
RDW: 14.7 % — ABNORMAL HIGH (ref 11.5–14.5)
WBC: 8.1 10*3/uL (ref 3.6–11.0)

## 2014-03-19 LAB — PRO B NATRIURETIC PEPTIDE: B-Type Natriuretic Peptide: 8705 pg/mL — ABNORMAL HIGH (ref 0–125)

## 2014-03-19 LAB — CK-MB: CK-MB: 1.2 ng/mL (ref 0.5–3.6)

## 2014-03-20 LAB — CBC WITH DIFFERENTIAL/PLATELET
Basophil #: 0.1 10*3/uL (ref 0.0–0.1)
Basophil %: 0.7 %
EOS PCT: 2 %
Eosinophil #: 0.2 10*3/uL (ref 0.0–0.7)
HCT: 33.1 % — AB (ref 35.0–47.0)
HGB: 10.9 g/dL — ABNORMAL LOW (ref 12.0–16.0)
LYMPHS PCT: 19.7 %
Lymphocyte #: 1.7 10*3/uL (ref 1.0–3.6)
MCH: 33.2 pg (ref 26.0–34.0)
MCHC: 32.8 g/dL (ref 32.0–36.0)
MCV: 101 fL — AB (ref 80–100)
MONOS PCT: 10.6 %
Monocyte #: 0.9 x10 3/mm (ref 0.2–0.9)
NEUTROS PCT: 67 %
Neutrophil #: 5.6 10*3/uL (ref 1.4–6.5)
Platelet: 264 10*3/uL (ref 150–440)
RBC: 3.27 10*6/uL — AB (ref 3.80–5.20)
RDW: 14.5 % (ref 11.5–14.5)
WBC: 8.4 10*3/uL (ref 3.6–11.0)

## 2014-03-20 LAB — BASIC METABOLIC PANEL
ANION GAP: 5 — AB (ref 7–16)
BUN: 47 mg/dL — AB (ref 7–18)
Calcium, Total: 8 mg/dL — ABNORMAL LOW (ref 8.5–10.1)
Chloride: 102 mmol/L (ref 98–107)
Co2: 31 mmol/L (ref 21–32)
Creatinine: 7.6 mg/dL — ABNORMAL HIGH (ref 0.60–1.30)
GFR CALC AF AMER: 6 — AB
GFR CALC NON AF AMER: 5 — AB
Glucose: 113 mg/dL — ABNORMAL HIGH (ref 65–99)
OSMOLALITY: 289 (ref 275–301)
Potassium: 4.7 mmol/L (ref 3.5–5.1)
Sodium: 138 mmol/L (ref 136–145)

## 2014-03-20 LAB — TROPONIN I: Troponin-I: <0.02 ng/mL

## 2014-03-20 LAB — CK-MB
CK-MB: 0.9 ng/mL (ref 0.5–3.6)
CK-MB: 0.9 ng/mL (ref 0.5–3.6)

## 2014-03-30 ENCOUNTER — Other Ambulatory Visit (HOSPITAL_COMMUNITY): Payer: Self-pay | Admitting: *Deleted

## 2014-03-30 ENCOUNTER — Ambulatory Visit: Payer: Self-pay | Admitting: Vascular Surgery

## 2014-03-30 DIAGNOSIS — R011 Cardiac murmur, unspecified: Secondary | ICD-10-CM

## 2014-04-06 ENCOUNTER — Other Ambulatory Visit: Payer: Medicaid Other

## 2014-04-13 ENCOUNTER — Other Ambulatory Visit: Payer: Self-pay

## 2014-04-13 DIAGNOSIS — R011 Cardiac murmur, unspecified: Secondary | ICD-10-CM

## 2014-06-15 ENCOUNTER — Emergency Department: Payer: Self-pay | Admitting: Emergency Medicine

## 2014-06-15 LAB — COMPREHENSIVE METABOLIC PANEL
ANION GAP: 8 (ref 7–16)
Albumin: 3.5 g/dL (ref 3.4–5.0)
Alkaline Phosphatase: 105 U/L
BUN: 33 mg/dL — AB (ref 7–18)
Bilirubin,Total: 0.4 mg/dL (ref 0.2–1.0)
CALCIUM: 8.6 mg/dL (ref 8.5–10.1)
CHLORIDE: 101 mmol/L (ref 98–107)
CO2: 30 mmol/L (ref 21–32)
Creatinine: 7.13 mg/dL — ABNORMAL HIGH (ref 0.60–1.30)
EGFR (African American): 7 — ABNORMAL LOW
EGFR (Non-African Amer.): 6 — ABNORMAL LOW
Glucose: 119 mg/dL — ABNORMAL HIGH (ref 65–99)
OSMOLALITY: 286 (ref 275–301)
Potassium: 4.1 mmol/L (ref 3.5–5.1)
SGOT(AST): 24 U/L (ref 15–37)
SGPT (ALT): 21 U/L (ref 12–78)
SODIUM: 139 mmol/L (ref 136–145)
TOTAL PROTEIN: 7.6 g/dL (ref 6.4–8.2)

## 2014-06-15 LAB — CBC WITH DIFFERENTIAL/PLATELET
BASOS ABS: 0.1 10*3/uL (ref 0.0–0.1)
BASOS PCT: 1 %
Eosinophil #: 0.4 10*3/uL (ref 0.0–0.7)
Eosinophil %: 4.4 %
HCT: 35.3 % (ref 35.0–47.0)
HGB: 11.9 g/dL — ABNORMAL LOW (ref 12.0–16.0)
LYMPHS PCT: 19.9 %
Lymphocyte #: 1.6 10*3/uL (ref 1.0–3.6)
MCH: 33.1 pg (ref 26.0–34.0)
MCHC: 33.9 g/dL (ref 32.0–36.0)
MCV: 98 fL (ref 80–100)
MONO ABS: 0.8 x10 3/mm (ref 0.2–0.9)
MONOS PCT: 10.2 %
NEUTROS ABS: 5.3 10*3/uL (ref 1.4–6.5)
NEUTROS PCT: 64.5 %
PLATELETS: 257 10*3/uL (ref 150–440)
RBC: 3.61 10*6/uL — ABNORMAL LOW (ref 3.80–5.20)
RDW: 13.7 % (ref 11.5–14.5)
WBC: 8.2 10*3/uL (ref 3.6–11.0)

## 2014-09-27 ENCOUNTER — Encounter: Payer: Self-pay | Admitting: General Surgery

## 2014-09-27 ENCOUNTER — Emergency Department: Payer: Self-pay | Admitting: Emergency Medicine

## 2014-10-10 ENCOUNTER — Emergency Department: Payer: Self-pay | Admitting: Emergency Medicine

## 2014-10-10 LAB — BASIC METABOLIC PANEL
ANION GAP: 8 (ref 7–16)
BUN: 40 mg/dL — ABNORMAL HIGH (ref 7–18)
Calcium, Total: 9 mg/dL (ref 8.5–10.1)
Chloride: 96 mmol/L — ABNORMAL LOW (ref 98–107)
Co2: 37 mmol/L — ABNORMAL HIGH (ref 21–32)
Creatinine: 7.68 mg/dL — ABNORMAL HIGH (ref 0.60–1.30)
EGFR (African American): 7 — ABNORMAL LOW
EGFR (Non-African Amer.): 6 — ABNORMAL LOW
GLUCOSE: 125 mg/dL — AB (ref 65–99)
Osmolality: 292 (ref 275–301)
Potassium: 3.7 mmol/L (ref 3.5–5.1)
Sodium: 141 mmol/L (ref 136–145)

## 2014-10-10 LAB — HEPATIC FUNCTION PANEL A (ARMC)
Albumin: 3.3 g/dL — ABNORMAL LOW (ref 3.4–5.0)
Alkaline Phosphatase: 84 U/L
Bilirubin, Direct: <0.1 mg/dL (ref 0.0–0.2)
Bilirubin,Total: 0.3 mg/dL (ref 0.2–1.0)
SGOT(AST): 16 U/L (ref 15–37)
SGPT (ALT): 20 U/L
TOTAL PROTEIN: 7 g/dL (ref 6.4–8.2)

## 2014-10-10 LAB — LIPASE, BLOOD: Lipase: 328 U/L (ref 73–393)

## 2014-10-10 LAB — MAGNESIUM: Magnesium: 1.9 mg/dL

## 2014-10-10 LAB — TROPONIN I
TROPONIN-I: 0.05 ng/mL
Troponin-I: 0.05 ng/mL

## 2014-10-10 LAB — CBC
HCT: 33.8 % — ABNORMAL LOW (ref 35.0–47.0)
HGB: 11.4 g/dL — ABNORMAL LOW (ref 12.0–16.0)
MCH: 33.7 pg (ref 26.0–34.0)
MCHC: 33.7 g/dL (ref 32.0–36.0)
MCV: 100 fL (ref 80–100)
PLATELETS: 304 10*3/uL (ref 150–440)
RBC: 3.37 10*6/uL — AB (ref 3.80–5.20)
RDW: 13.5 % (ref 11.5–14.5)
WBC: 8.3 10*3/uL (ref 3.6–11.0)

## 2015-01-06 ENCOUNTER — Observation Stay: Payer: Self-pay | Admitting: Internal Medicine

## 2015-01-07 DIAGNOSIS — R079 Chest pain, unspecified: Secondary | ICD-10-CM

## 2015-01-27 ENCOUNTER — Encounter: Payer: Self-pay | Admitting: General Surgery

## 2015-01-27 ENCOUNTER — Ambulatory Visit (INDEPENDENT_AMBULATORY_CARE_PROVIDER_SITE_OTHER): Payer: Medicare Other | Admitting: General Surgery

## 2015-01-27 VITALS — BP 150/70 | HR 74 | Resp 18 | Ht 65.0 in | Wt 146.0 lb

## 2015-01-27 DIAGNOSIS — M6740 Ganglion, unspecified site: Secondary | ICD-10-CM

## 2015-01-27 NOTE — Patient Instructions (Signed)
Patient to return as needed. 

## 2015-01-27 NOTE — Progress Notes (Signed)
Patient ID: Michele Nash, female   DOB: 10-Sep-1956, 59 y.o.   MRN: [id number]  Chief Complaint  Patient presents with  . Other    abscess on left hand    HPI Michele Nash is a 59 y.o. female here today for a evaluation of a abscess on her left hand on ring finger . Patient state she noticed the area about two years ago.  She states she squeezes the area daily and then the pus  comes out ,by the next day the pus is back.  HPI  Past Medical History  Diagnosis Date  . Chronic kidney disease   . COPD (chronic obstructive pulmonary disease)   . Hypertension   . Hyperlipidemia   . Depression   . Dialysis patient 2014    Past Surgical History  Procedure Laterality Date  . Abdominal hysterectomy    . Dialysis fistula creation    . Colonoscopy  2011    Family History  Problem Relation Age of Onset  . Heart disease Mother   . Cancer Father   . Cancer Sister     Social History History  Substance Use Topics  . Smoking status: Current Every Day Smoker -- 0.50 packs/day  . Smokeless tobacco: Never Used  . Alcohol Use: No    Allergies  Allergen Reactions  . Sulfa Antibiotics Swelling    Current Outpatient Prescriptions  Medication Sig Dispense Refill  . albuterol (PROVENTIL HFA;VENTOLIN HFA) 108 (90 BASE) MCG/ACT inhaler Inhale 2 puffs into the lungs every 6 (six) hours as needed for wheezing or shortness of breath.    . ALPRAZolam (XANAX) 0.5 MG tablet Take 0.5 mg by mouth 2 (two) times daily.    Marland Kitchen amLODipine (NORVASC) 5 MG tablet Take 5 mg by mouth daily.    Marland Kitchen amoxicillin-clavulanate (AUGMENTIN) 500-125 MG per tablet Take 1 tablet by mouth 2 (two) times daily.    . calcium carbonate (OS-CAL) 600 MG TABS tablet Take 600 mg by mouth 2 (two) times daily with a meal.    . carvedilol (COREG) 12.5 MG tablet Take 12.5 mg by mouth 2 (two) times daily with a meal.    . cinacalcet (SENSIPAR) 30 MG tablet Take 30 mg by mouth daily.    Marland Kitchen ezetimibe (ZETIA) 10 MG tablet Take 10 mg by  mouth daily.    . fluticasone (VERAMYST) 27.5 MCG/SPRAY nasal spray Place 2 sprays into the nose daily.    . Fluticasone-Salmeterol (ADVAIR DISKUS IN) Inhale into the lungs.    . furosemide (LASIX) 40 MG tablet Take 40 mg by mouth.    Marland Kitchen HYDROcodone-acetaminophen (NORCO/VICODIN) 5-325 MG per tablet Take 1 tablet by mouth every 6 (six) hours as needed for moderate pain.    Marland Kitchen losartan (COZAAR) 100 MG tablet Take 100 mg by mouth daily.    Marland Kitchen omeprazole (PRILOSEC) 20 MG capsule Take 20 mg by mouth daily.    . sertraline (ZOLOFT) 50 MG tablet Take 50 mg by mouth daily.     No current facility-administered medications for this visit.    Review of Systems Review of Systems  Constitutional: Negative.   Respiratory: Negative.   Cardiovascular: Negative.     Blood pressure 150/70, pulse 74, resp. rate 18, height 5\' 5"  (1.651 m), weight 146 lb (66.225 kg).  Physical Exam Physical Exam  Skin:     It is fluctuant and mobile.   Data Reviewed None Assessment    Likely a ganglion left ring finger. Plan  Given minimal to no symptoms no further treatment required. Recommend ortho consult if it starts to enlarge or cause mor symptoms. Discussed fully with pt.        Michele Nash 01/27/2015, 4:11 PM

## 2015-02-05 ENCOUNTER — Emergency Department: Payer: Self-pay | Admitting: Emergency Medicine

## 2015-03-01 ENCOUNTER — Emergency Department
Admit: 2015-03-01 | Disposition: A | Discharge status: Home / Self Care | Payer: Self-pay | Admitting: Emergency Medicine

## 2015-03-18 NOTE — H&P (Signed)
PATIENT NAME:  Michele Nash, DONLON MR#:  [id number] DATE OF BIRTH:  1956/06/19  DATE OF ADMISSION:  06/07/2013  PRIMARY CARE PHYSICIAN: Nonlocal.   REFERRING PHYSICIAN: Dr. Jasmine December.   CHIEF COMPLAINT: Sweating all over the body.   HISTORY OF PRESENT ILLNESS: Michele Nash is a 59 year old African American female with history of end-stage renal disease on hemodialysis Tuesday, Thursday, Saturday schedule. Had recent admission on 06/02/2013 for fluid overload and shortness of breath. The patient underwent dialysis and was discharged in stable condition. Was doing well until last night. Started to experience skipped beats and diaphoresis with mild nausea. Concerning this, came to the Emergency Department. Workup in the Emergency Department, the patient was found to have a potassium of 7.7, with EKG changes of peaked T waves. Nephrology was consulted. The patient received temporizing measures with calcium gluconate, 10 units of insulin with D50. Will need to repeat the potassium to ensure it is trending down. The patient also experienced somewhat tightness in the chest, nausea associated with this. The patient makes some urine. Denies having any cough.   PAST MEDICAL HISTORY:  1. End-stage renal disease, on hemodialysis Tuesday, Thursday, Saturday schedule.  2. COPD.   3. Hypertension.  4. Hyperlipidemia.  5. Hyperparathyroidism.  6. Chronic back pain.  7. Gastroesophageal reflux disease.   8. History of seizure disorder.  9. Diet controlled diabetes mellitus.  10. Anemia of chronic disease as well as iron deficiency anemia.  11. History of GI bleed.  12. Obesity.   PAST SURGICAL HISTORY:  1. Left ovarian cyst surgery.  2. Left upper extremity AV fistula placement.  3. History of cysts from her back.  4. Ankle repair status post fracture.   ALLERGIES: SULFA DRUGS.    HOME MEDICATIONS:  1. Sensipar 30 mg once a day.  2. ProAir 2 puffs 4 times a day.  3. Prednisone 20 mg once a day.  4.  Oxycodone 5 mg every 6 hours as needed.  5. Metoprolol 50 mg 2 times a day.  6. Losartan 100 mg once a day.  7. Lipitor 10 mg once a day.  8. Levaquin 250 mg every 48 hours.  9. Hydralazine 50 mg 3 times a day.  10. Nexium 40 mg once a day.  11. Calcium acetate 2 capsules 3 times a day.  12. Benadryl 25 mg tablet daily as needed.   SOCIAL HISTORY: The patient continues to smoke 1/2 pack a day. Denies drinking alcohol or using illicit drugs. Married, lives with her husband.   FAMILY HISTORY: Mother died from complications of heart disease. Father died from malignancy of unknown type.   REVIEW OF SYSTEMS:   CONSTITUTIONAL: Generalized weakness.  EYES: No blurry vision.  EARS: No tinnitus, change in hearing.  RESPIRATORY: No cough, shortness of breath.  CARDIOVASCULAR: No chest pain or palpitations. No pedal edema.  GENITOURINARY: No dysuria or hematuria.  ENDOCRINE: No polyuria or polydipsia.  HEMATOLOGY: No easy bruising or bleeding.  MUSCULOSKELETAL: Has multiple joint pains.  NEUROLOGIC: No numbness or weakness in any part of the body.   PHYSICAL EXAMINATION:  GENERAL: This is a well-built, well-nourished, age-appropriate female lying down in the bed, not in distress.  VITAL SIGNS: Temperature 97.8, pulse 73, blood pressure 192/87, respiratory rate of 16, oxygen saturation is 96% on room air.  HEENT: Head normocephalic, atraumatic. There is no scleral icterus. Conjunctivae normal. Pupils equal and react to light. Extraocular movements are intact. Mucous membranes moist. No pharyngeal erythema.   NECK: Supple. No  lymphadenopathy. No JVD. No carotid bruit.  CHEST: Has no focal tenderness.  LUNGS: Bilaterally clear to auscultation.  HEART: S1, S2 regular. No murmurs are heard. No pedal edema. Pulses 2+.  ABDOMEN: Bowel sounds present. Soft, nontender, nondistended. No hepatosplenomegaly.  NEUROLOGIC: The patient is alert, oriented to place, person and time. Cranial nerves are intact.  Motor 5/5 in upper and lower extremities.   LABS: CMP: BUN 45, creatinine of 6.6, potassium of 7.7. Cardiac enzymes are well within normal limits. CBC is well within normal limits.   ASSESSMENT AND PLAN: [name] is a 59 year old female who comes to the Emergency Department with hyperkalemia.  1. Hyperkalemia: The patient states has been compliant with her diet. Admit the patient to a monitored bed. The patient received all temporizing measures with calcium gluconate, insulin. Will give 1 dose of Kayexalate. Nephrology has been consulted. The patient will be receiving dialysis overnight.  2. Hypertension, poorly controlled: Will continue the home medications and follow up. The patient will be undergoing dialysis.  3. End-stage renal disease on hemodialysis: The patient states is compliant with her dialysis.  4. Deep vein thrombosis prophylaxis with heparin.   TIME SPENT: 50 minutes.   ____________________________ Monica Becton, MD pv:gb D: 06/07/2013 23:34:06 ET T: 06/08/2013 00:29:08 ET JOB#: [id number]  cc: Monica Becton, MD, <Dictator> Monica Becton MD ELECTRONICALLY SIGNED 06/10/2013 0:25

## 2015-03-18 NOTE — Op Note (Signed)
PATIENT NAME:  Michele Nash, Michele Nash MR#:  [id number] DATE OF BIRTH:  May 15, 1956  DATE OF PROCEDURE:  06/24/2013  PREOPERATIVE DIAGNOSES:  1.  End-stage renal disease.  2.  Complication dialysis access device with pseudoaneurysm bleeding from left arm arteriovenous fistula.  3.  Hypertension.   POSTOPERATIVE DIAGNOSES:  1.  End-stage renal disease.  2.  Complication dialysis access device with pseudoaneurysm bleeding from left arm arteriovenous fistula.  3.  Hypertension.   PROCEDURES:  1.  Ultrasound guidance for vascular access, left brachiocephalic AV fistula in a retrograde fashion.  2.  Left upper extremity fistulogram, central venogram.  3.  Percutaneous transluminal angioplasty of mid left arm cephalic vein near pseudoaneurysm with 7 mm diameter angioplasty balloon.   SURGEON: Algernon Huxley, M.D.   ANESTHESIA: Local with moderate conscious sedation.   ESTIMATED BLOOD LOSS: Minimal.   INDICATION FOR PROCEDURE: A 59 year old [place] American female with end-stage renal disease. She had a significant bleeding episode with a large soft-tissue hematoma, bruising which is dissipated over the last week or so. A noninvasive study performed following this showed a moderate sized pseudoaneurysm near the arterial access site. Fistulogram was performed for further evaluation. There are elevated velocities just beyond the pseudoaneurysm on our noninvasive study.   DESCRIPTION OF PROCEDURE: The patient is brought to the vascular interventional radiology suite. The left upper extremity was sterilely prepped and draped and a sterile surgical field was created. The fistula was accessed approximately 5 cm more central to the area of the pseudoaneurysm. With a micropuncture needle, a micropuncture wire and sheath were then placed. Imaging with a Kumpe catheter placed, the arterial anastomosis showed the perianastomotic region to be patent without hemodynamically significant stenosis and some mild irregularity  at the swing point. The pseudoaneurysm was a moderate sized pseudoaneurysm. There was a caliber change in the vein on the anastomotic side of the pseudoaneurysm. The vein measured in the 10 to 11 mm range, and on the more central side of it, the vein appeared to be somewhat narrowed and possibly was stenotic and measured only in the 6 to 7 mm range.   I ballooned this area with a 7 mm balloon, and there was a bit of a waste just more central to the pseudoaneurysm, but this did not reach the size of the vein on the other side of the pseudoaneurysm. The remainder of the central venous circulation was patent, and she had good outflow centrally. The pseudoaneurysm persisted. From my access, I did not feel there was a sufficient area to adequately cover this with any coverage stent today. A FLAIR stent could be considered to allow to flare into the larger size of the fistula nearer to the elbow than the pseudoaneurysm, but I did not feel this to be a safe endeavor today.   We will plan to see her back in the office in a couple of weeks, and re-ultrasound the area; and if the pseudoaneurysm persists and there are still issues with elevated velocities just beyond this, accessing her more centrally to treat this lesion may be appropriate.   At this point, the sheath was removed around a 4-0 Monocryl pursestring suture. Pressure was held. Sterile dressing was placed. The patient tolerated the procedure well and was taken to the recovery room in stable condition.    ____________________________ Algernon Huxley, MD jsd:np D: 06/24/2013 15:49:53 ET T: 06/24/2013 17:15:08 ET JOB#: [id number]  cc: Algernon Huxley, MD, <Dictator> Algernon Huxley MD ELECTRONICALLY SIGNED 07/02/2013 8:14

## 2015-03-18 NOTE — Op Note (Signed)
PATIENT NAME:  Michele Nash, Michele Nash MR#:  [id number] DATE OF BIRTH:  Apr 13, 1956  DATE OF PROCEDURE:  03/27/2013  PREOPERATIVE DIAGNOSIS: End-stage renal disease requiring hemodialysis.   POSTOPERATIVE DIAGNOSIS: End-stage renal disease requiring hemodialysis.   PROCEDURE PERFORMED: Creation of left brachiocephalic fistula.   SURGEON: Katha Cabal, MD  ANESTHESIA: General by LMA.   FLUIDS: Per anesthesia record.   ESTIMATED BLOOD LOSS: Minimal.   SPECIMEN: None.   INDICATIONS: The patient is a 59 year old woman who presented with worsening renal function. She is now preparing for hemodialysis and therefore requires upper extremity access. Risks and benefits for creation of a fistula were reviewed. All questions answered. The patient agrees to proceed.   DESCRIPTION OF PROCEDURE: The patient is taken to the operating room and placed in the supine position. After adequate sedation is achieved, she is positioned supine, and her left arm is extended palm upward. The left arm is prepped and draped in a sterile fashion. Marcaine 0.25% is then infiltrated in the soft tissues in the antecubital fossa. A curvilinear incision is then created, and the cephalic vein is exposed. It is dissected circumferentially both proximally and distally. Side branches are ligated with 3-0 and 4-0 silk ties. It is marked with a surgical marker.   The brachial artery is then exposed through the medial aspect of the incision and looped proximally and distally. Two small branches are clipped with hemoclips.   The cephalic vein is then ligated distally with a 2-0 silk tie, transected with Potts scissors, dilated, irrigated. The brachial artery is then delivered into the surgical field and controlled proximally and distally with Silastic Vesseloops. Arteriotomy is made, extended with Potts scissors. Stay sutures of 6-0 Prolene are then placed, and an end vein to side brachial artery anastomosis is fashioned with running 6-0  Prolene. Flushing maneuvers are performed, and flow is established through the fistula. Excellent thrill is noted. The wound is then irrigated and closed in layers using interrupted 3-0 Vicryl, followed by 4-0 Monocryl subcuticular. The patient tolerated the procedure well, and there were no immediate complications.   ____________________________ Katha Cabal, MD ggs:OSi D: 03/27/2013 12:10:37 ET T: 03/27/2013 12:46:14 ET JOB#: [id number]  cc: Katha Cabal, MD, <Dictator> Katha Cabal MD ELECTRONICALLY SIGNED 04/06/2013 13:49

## 2015-03-18 NOTE — H&P (Signed)
PATIENT NAME:  Michele Nash, Michele Nash MR#:  [id number] DATE OF BIRTH:  1956-04-20  DATE OF ADMISSION:  02/27/2013  ADMITTING DIAGNOSES:  Left arm pain, some chest pressure, tremors.   HISTORY OF PRESENT ILLNESS: The patient is a pleasant 59 year old African American female with past history of hypertension, chronic kidney disease, diet-controlled diabetes, history of seizure disorder, hyperlipidemia, history of iron deficiency anemia, history of GI bleed with mild gastritis who was previously followed by [facility] nephrology but has not seen any doctors in about a year to year and a half. She presents to the ED complaining of having pain in her shoulder is as well as dyspnea on exertion. She is also having some tremors. She has been having nausea and has had poor appetite, fatigue and weakness. The patient, in the ED was noted to have a creatinine of 9.34 and a GFR of 5. Therefore, we were asked to admit the patient. She otherwise does complain of some chest pressure. She was in the hospital in October and at that time had a cardiac stress test which was negative.   PAST MEDICAL HISTORY:  1.  Significant for chronic kidney disease, now likely end-stage renal disease.  2.  Diabetes. She is not on any medication.  3.  Hypertension, not on any medications. 4.  History of seizure disorder. No seizures recently.  5.  Hyperlipidemia.  6.  Morbid obesity.  7.  Iron deficiency anemia as well as anemia of chronic kidney disease.  8.  History of gastrointestinal bleed with mild gastritis in November 2011.   ALLERGIES:  SULFA DRUGS.   MEDICATIONS: None.   PAST SURGICAL HISTORY:  Status post left ovarian cyst surgery, left upper extremity arteriovenous fistula placement in August 2012, history of cysts resection from her back, ankle repair status post fracture.   SOCIAL HISTORY: The patient continues to smoke less than 1/2 pack per day. Does not drink. No drugs. Lives with her husband.   REVIEW OF SYSTEMS:    CONSTITUTIONAL: Denies any fevers or chills. Complains of generalized weakness, fatigue. No weight changes.  HEENT: Denies any visual difficulties, no blurred vision. No cataracts. No glaucoma.  ENT: Denies any hearing loss. No ringing in the ears. Denies any sore throat.  RESPIRATORY:  She does complain of dyspnea on exertion. No COPD, no tuberculosis no pneumonia, no hemoptysis.  CARDIOVASCULAR: Complains of some chest pressure.  Denies any edema. No syncope. She does have high blood pressure. No arrhythmias.  GASTROINTESTINAL: Denies any nausea or vomiting. Complains of loss of appetite and loss of taste.  GENITOURINARY: Denies any painful urination. Denies any difficulty with urination. No blood in the urine.  ENDOCRINE: Denies any polydipsia, polyphagia. Does have history of diabetes, but is not on any medications.  MUSCULOSKELETAL:  Complains of pain in her shoulder and other joint pains.  SKIN: Denies any skin rash except complains that when she woke up this morning she noticed erythema involving her left eyelid. NEUROLOGICAL: Denies any CVA or TIA. She did have a seizure, a long time ago. No seizures since.  HEMATOLOGIC: Has history of having anemia in the past. No easy bruisability or bleeding.  PSYCHIATRIC: Does have depression.   PHYSICAL EXAMINATION: VITAL SIGNS: Temperature 98.9, pulse 75, respirations 22, blood pressure 210/107 on presentation, O2 97%.  GENERAL: The patient is an obese African American female in no acute distress.  HEENT: Head atraumatic, normocephalic. Pupils equally round, reactive to light and accommodation. There is no conjunctival pallor. No scleral icterus. Left  eyelid:  She has some erythema and warmth. Nasal exam shows no drainage or ulceration.  Oropharynx clear without any exudate. NECK: No thyromegaly. No carotid bruits.  CARDIOVASCULAR: Regular rate and rhythm. No murmurs, rubs, clicks or gallops. PMI is not displaced.  ABDOMEN: Soft, nontender,  nondistended. Positive bowel sounds x 4.  EXTREMITIES: No clubbing, cyanosis or edema.  SKIN: No rash.   LYMPHATICS: No lymph nodes palpable.  VASCULAR: Good DP and PT pulses.  PSYCHIATRIC: Not anxious or depressed.  NEUROLOGIC:  Awake, alert, oriented x 3. No focal deficits.   LABORATORY AND DIAGNOSTIC DATA:  Evaluations in the ED shows EKG normal sinus rhythm with possible left atrial enlargement with left ventricular hypertrophy. Chest x-ray showed no acute cardiopulmonary processes. TSH 0.186. BMP: Glucose 104, BUN 14, creatinine 9.39, sodium 139, potassium 2.9, chloride 101, CO2 27, calcium 5.7. LFTs are normal except alkaline phosphatase of 191. WBC 8.3. Hemoglobin 8, platelet count 282.   ASSESSMENT AND PLAN: The patient is a 59 year old African American female with progressive renal failure, now end-stage renal disease.   1.  Chronic renal failure, now progressed to end-stage renal disease. Will need hemodialysis and nephrology also has been consulted. They will evaluate for dialysis during the hospitalization.  2.  Chest pressure and left arm pain, very atypical. We will check serial cardiac enzymes. EKG is nonrevealing. We will follow her symptoms.  3.  Hypokalemia. We will give her up potassium replacement.  4.  Diabetes, not on any treatment at home. Will place her on sliding scale insulin, carbohydrate consistent diet. Check a hemoglobin A1C.  5.  Hyperlipidemia, not on any treatment. We will check a fasting lipid panel in the a.m.  6.  Chronic obstructive pulmonary disease.  We will start her on Advair metered dose inhaler and used p.r.n. nebulizers as needed.  7.  History of iron deficiency anemia. We will check iron ferritin, guaiac her stool.  8.  Possible left eyelid cellulitis. We will place her on p.o. Augmentin and follow her symptoms.  9.  Miscellaneous. Will place her on heparin for deep vein thrombosis prophylaxis.  TIME SPENT:  20minutes spent on H and P.     ____________________________ Lafonda Mosses. Posey Pronto, MD shp:ct D: 02/28/2013 14:19:46 ET T: 02/28/2013 14:36:11 ET JOB#: [id number]  cc: Pamila Mendibles H. Posey Pronto, MD, <Dictator> Alric Seton MD ELECTRONICALLY SIGNED 03/01/2013 19:01

## 2015-03-18 NOTE — Discharge Summary (Signed)
PATIENT NAME:  Michele Nash, Michele Nash MR#:  [id number] DATE OF BIRTH:  1956/01/26  DATE OF ADMISSION:  06/03/2013 DATE OF DISCHARGE:  06/04/2013  Please refer to H and P dictated by Dr. Verdell Carmine.   REASON FOR ADMISSION:  Shortness of breath and weakness after dialysis.   DISCHARGE DIAGNOSES: 1.  Shortness of breath secondary to diastolic congestive heart failure with fluid overload.  2.  End-stage renal disease.  3.  Chronic obstructive pulmonary disease with mild exacerbation.  4.  Hypertension, uncontrolled.  5.  Hyperlipidemia.  6.  Secondary hyperparathyroidism.  7.  Chronic back pain.  8.  Abdominal pain, resolved.  9.  Gastroesophageal reflux.  10.  Hyperlipidemia.   DISPOSITION:  Home.   MEDICATIONS AT DISCHARGE:  Benadryl 25 mg as needed for allergies, calcium acetate take 2 capsules 3 times a day and 1 with snacks, Sensipar 30 mg once a day, Lasix 40 mg once a day on non-dialysis days, metoprolol tartrate 50 mg twice daily, losartan 100 mg once a day, ProAir HFA 90 mcg 4 times a day, prednisone 20 mg once a day for two days, hydralazine 50 mg 3 times a day, nicotine patch 14 mg daily, oxycodone 5 mg oral tablets every 6 hours as needed for pain, Lipitor 10 mg once a day, esomeprazole 40 mg once a day oral delayed-release capsule.   FOLLOWUP:  With primary care physician and Dr. Holley Raring in the next two weeks.   HOSPITAL COURSE:  The patient is a very nice 59 year old female, has history of end-stage renal disease, hypertension, which has been uncontrolled on multiple medications, hyperlipidemia, secondary hyperparathyroidism, COPD and ongoing tobacco use.  The patient came with shortness of breath after dialysis.  She has dialysis Tuesday, Thursday, Friday.  Apparently during the weekend the patient has been eating large amounts of watermelon and drinking a lot of fluids for which she had a weight gain of 10 pounds from her dry weight.  The patient underwent dialysis, but she was still really  short of breath.  After it, she needed to sleep with 2 pillows because of significant orthopnea. When she showed up in the ER, her BNP (beta natriuretic peptide) was 49,000 and previous to that it was checked on April of last year, it was only 800.  The patient was also complaining of mild cough with sputum prior to this which likely is COPD exacerbation.  On admission, the patient had a pulse of 62, respirations of 20, blood pressure 179/97 and oxygen saturation 96% on room air.  Her lungs had prolonged expiratory phase, and mild end-expiratory wheezing.  No use of accessory muscles.  Her chest x-ray showed vascular congestion.  The patient was admitted for evaluation and treatment of that.  She got dialyzed by Dr. Holley Raring on the 8th and then back again on the 10th.  She had an ultrafiltration rate of several liters, 4 liters today and her dry weight went back to normal.  The patient dropped 4.8 kg.    As far as her end-stage renal disease she needs to continue dialysis on a regular basis.  She will follow up with her dialysis center.   As far as her COPD exacerbation, we are going to give her 2 more days of steroids.  No need for a major taper as she is taking only 20 mg and nicotine counseling has been given to her.  She is going to take nicotine patches for substitution of cigarettes.    The patient has significant  hypertension.  She is taking 2 beta-blockers for no clear reason.  We are going to stop Coreg as that is the one that takes longer and is more difficult to dialyze.  Keep her on metoprolol and substitute that for hydralazine.  She will be taking 50 mg 3 times a day.   As far as her shortness of breath, it has improved.    The patient asked for prescriptions for GERD, prescriptions for cholesterol, which we are going to give and prescriptions for pain medication.  She was complaining of some abdominal pain, which was likely due to GERD.  She had also some back pain which is chronic in nature.   We are going to prescribe some OxyContin.   I spent about 45 minutes with this discharge.    ____________________________ Callaway Sink, MD rsg:ea D: 06/04/2013 16:07:00 ET T: 06/05/2013 00:05:29 ET JOB#: [id number]  cc: Tama High, MD [facility] Sink, MD, <Dictator>   Lizett Chowning America Brown MD ELECTRONICALLY SIGNED 06/07/2013 13:27

## 2015-03-18 NOTE — Discharge Summary (Signed)
PATIENT NAME:  Michele Nash, Michele Nash MR#:  [id number] DATE OF BIRTH:  June 16, 1956  DATE OF ADMISSION:  09/28/2013 DATE OF DISCHARGE:  09/30/2013  DISCHARGE DIAGNOSES: 1. Acute respiratory failure due to chronic obstructive pulmonary disease exacerbation and pulmonary edema.  2. End-stage renal disease, on hemodialysis.  3. Chronic obstructive pulmonary disease exacerbation. Current smoker.  4. Anxiety.  5. Hypertension, uncontrolled on admission.   CONDITION ON DISCHARGE: Stable.   CODE STATUS: FULL CODE.   MEDICATIONS ON DISCHARGE: 1. Calcium acetate 667 mg oral capsule 3 times a day.  2. Sensipar 30 mg oral tablet once a day.  3. Lasix 40 mg oral tablet once a day.  4. Losartan 100 mg oral once a day.  5. Zetia 10 mg oral tablet once a day.  6. Omeprazole 20 mg delayed-release once a day.  7. Carvedilol 12.5 mg oral tablet 2 times a day.  8. Norco 325 mg oral tablet every six hours as needed.  9. Prednisone 10 mg, start 60 mg and taper x 10 mg daily until complete.  10. Alprazolam 0.5 mg oral 2 times a day.  11. Ceftin 500 mg oral 2 times a day for three days.  12. Azithromycin  500 mg oral once a day for three days.   DIET ON DISCHARGE: Renal diet and regular consistency.   TIMEFRAME TO FOLLOW-UP: Within 1 to 2 weeks with PMD and to have regular follow-ups.    HISTORY OF PRESENT ILLNESS: A 59 year old female with history of end-stage renal disease, on hemodialysis on Monday, Wednesday and Friday. Other history: Hypertension, diet controlled diabetes, and ongoing tobacco abuse, came to the hospital because of shortness of breath and did not have any cough or fever, and started feeling chest tightness. Oxygen saturation was 88 and was dropping to 74 on room air as per the admission note. Blood pressure was ranging from 220/110. Urgent hemodialysis was arranged because of her pulmonary edema and fluid overload issue and she was admitted for further management on medical floor.   HOSPITAL  COURSE AND STAY:  1. For acute respiratory failure. She was also found having exacerbation of chronic obstructive pulmonary disease and was given IV steroid, DuoNeb and antibiotics, and had significant improvement in next two days in respiratory status.  2. Pulmonary edema on hemodialysis. Dialysis was done urgently on presentation and then as a routine.  3. History of chronic back pain. She was on Norco. We continued the same.  5. Malignant hypertension secondary to pulmonary edema and due to fluid overload. Hemodialysis was done and the patient was continued on Coreg and losartan  helped to control blood pressure nicely.   CONSULT IN THE HOSPITAL: Dr. Candiss Norse for hemodialysis.   IMPORTANT LABORATORY RESULTS IN THE HOSPITAL: Troponin was 0.03. WBC was 10.9, hemoglobin was 10.9, potassium on presentation was 5.3. On ABG , pCO2 was 65 and pO2 was 49 on presentation with 32% oxygen supplementation.  Chest x-ray, PA and lateral on admission shows mild diffuse bilateral interstitial changes. Interstitial pulmonary edema is a possibility.  TOTAL TIME SPENT ON THIS DISCHARGE: 40 minutes.   ____________________________ Ceasar Lund Anselm Jungling, MD vgv:sg D: 10/04/2013 22:28:00 ET T: 10/05/2013 07:20:55 ET JOB#: [id number]  cc: Ceasar Lund. Anselm Jungling, MD, <Dictator> Isla Pence, MD  Vaughan Basta MD ELECTRONICALLY SIGNED 10/12/2013 8:15

## 2015-03-18 NOTE — Discharge Summary (Signed)
PATIENT NAME:  Michele Nash, Michele Nash MR#:  [id number] DATE OF BIRTH:  10-22-1956  DATE OF ADMISSION:  02/27/2013 DATE OF DISCHARGE:  03/03/2013  DISCHARGE DIAGNOSES: 1.  End-stage renal disease. 2.  Accelerated hypertension. 3.  Hyperlipidemia. 4.  Diabetes. 5.  Chronic anemia. 6.  Bradycardia.  7.  Chronic obstructive pulmonary disease.   CONDITION ON DISCHARGE: Stable.   CODE STATUS ON DISCHARGE: FULL CODE.   DISCHARGE MEDICATIONS: 1.  Clonidine 0.2 mg oral tablet 2 times a day. 2.  Albuterol ipratropium inhalation aerosol 1 puff 3 times a day as needed for shortness of breath.  3.  Fluticasone salmeterol 1 puff inhaled 2 times a day. 4.  Iron polysaccharide 1 capsule orally 2 times a day. 5.  Calcium acetate 667 mg oral capsule 3 times a day.   HOME OXYGEN: No.   DISCHARGE DIET:  Regular.  Supplement: Nepro dietary supplement, frequency once a day. Consistency: Regular.   ACTIVITY: Limited as tolerated.   TIMEFRAME TO FOLLOW-UP:  Within 1 to 2 days with Dr. Candiss Norse in clinic. Follow with nephrology clinic to continue hemodialysis.  HISTORY OF PRESENT ILLNESS: This is a 59 year old African American female with past medical history of hypertension, chronic kidney disease, diet-controlled diabetes mellitus, seizure disorder, hyperlipidemia, iron deficiency anemia and GI bleed with mild gastritis, previously followed by [facility] nephrology but has not seen any doctor from about a year and half. She presented to Emergency Room complaining of having pain in her shoulder as well as dyspnea on exertion, also having some tremors.  She has been having nausea and poor appetite, fatigue and weakness. In the Emergency Room, she was noted to have creatinine of 9.34 and therefore nephrology consult was called in. They managed temporary dialysis catheter and started her on hemodialysis.   HOSPITAL COURSE AND STAY: The patient felt significantly better after starting on hemodialysis. Vascular consult was  called in and they placed PermCath and gave her an appointment to scan her veins to get AV fistula in near future. She was started on hemodialysis and arranged for that as outpatient.   OTHER MEDICAL ISSUES ADDRESSED DURING THE HOSPITAL STAY:  1.  Bradycardia. She was remaining bradycardic.  We decreased her dose of atenolol, what she was taking at home.  2.  Some complaint of chest pain or shoulder pain on presentation. Cardiac enzymes were negative and pain resolved. After hospital stay of 1 or 2 days, EKG was nonrevealing.  3.  Hypokalemia. She was replaced and remained stable.  4.  Diabetes. HbA1c was 5.4. She was not on any treatment. Diet controlled. We continued the same.  5.  Hyperlipidemia. Advised to do diet and exercise. Her LDL was slightly elevated.  6.  COPD.  There was no evidence of acute exacerbation. We continued her on Advair and p.r.n. albuterol.  7.  History of iron deficiency anemia and anemia of chronic disease. We started her on iron supplements.  8.  Hypocalcemia.  Improved after replacement.  9.  Left eyelid possible cellulitis. She was taking Augmentin for that and she had significant improvement.  10.  Accelerated hypertension. She was on clonidine b.i.d. and atenolol and hydralazine. Blood pressure came under control and remained stable after that.  CONSULTANTS:  Nephrology with Dr. Murlean Iba and vascular with Dr. Hortencia Pilar.    LABORATORY AND DIAGNOSTICS:  Creatine on presentation was 9.39.  BNP was 897. Iron level was 35.  Creatinine level came down to 7.  She was continued on hemodialysis  though.  Hemoglobin level was 8 on presentation, remained stable at that level.  Vitamin B12 level 852. Ionized calcium less than 3.0. HBsAg negative. HB core antibody negative. Parathyroid hormone 1315. Hepatitis B surface antibody nonreactive.  X-ray of chest on admission: No acute cardiopulmonary disease.   TOTAL TIME SPENT ON THIS DISCHARGE: 45  minutes. ____________________________ Michele Lund Anselm Jungling, MD vgv:sb D: 03/06/2013 08:25:32 ET T: 03/06/2013 09:50:25 ET JOB#: [id number]  cc: Michele Lund. Anselm Jungling, MD, <Dictator> Murlean Iba, MD Katha Cabal, MD  Vaughan Basta MD ELECTRONICALLY SIGNED 03/08/2013 22:19

## 2015-03-18 NOTE — Discharge Summary (Signed)
PATIENT NAME:  Michele Nash, Michele Nash MR#:  [id number] DATE OF BIRTH:  1956/11/01  DATE OF ADMISSION:  06/07/2013 DATE OF DISCHARGE:  06/08/2013  PRESENTING COMPLAINT: Sweating all over the body.   DISCHARGE DIAGNOSES: 1.  Hyperkalemia status post urgent dialysis x 2.  2.  Abnormal EKG with tall peak T waves, resolved after urgent dialysis. 3.  Accelerated hypertension.  4.  End-stage renal disease, on hemodialysis.   CONDITION ON DISCHARGE: Fair.   CODE STATUS: FULL CODE.   DISCHARGE MEDICATIONS: 1.  Benadryl 25 mg p.o. daily as needed.  2.  Calcium acetate 667 two capsules 3 times a day and 1 with snacks.  3.  Sensipar 30 mg daily.  4.  Lasix 40 mg once a day on nondialysis days.  5.  Metoprolol 50 mg b.i.d.  6.  Losartan 100 mg 1 tablet daily.  7.  ProAir HFA 90 mcg 2 puffs 4 times a day as needed.  8.  Levaquin 250 mg every other day.  Complete your prescription.   9.  Hydralazine 50 mg t.i.d. 10.  Oxycodone 5 mg 6 times a day as needed.  11.  Lipitor 10 mg at bedtime.  12.  Esomeprazole 40 mg daily.   DISCHARGE DIET: Renal.  DISCHARGE INSTRUCTIONS:  Resume dialysis on your routine schedule as before. Follow up with your primary care physician in 1 to 2 weeks.   CONSULTANTS:  Nephrology with Dr. Candiss Norse.   LABORATORY AND DIAGNOSTICS: Ultrasound OF kidneys shows relatively small bilaterally increased echogenicity, suggests medical renal disease. White count is 11.1. Potassium was 5.4. Phosphorus 3.8. Chest x-ray shows no acute cardiopulmonary abnormality. CBC within normal limits. Potassium on admission was 7.7.   HISTORY OF PRESENT ILLNESS:  Michele Nash is a 59 year old African American female with end-stage renal disease who was recently admitted with CHF and volume overload, was discharged to home, came in with: 1.  Hyperkalemia. The patient said she started having some palpitations, not feeling well, came to the Emergency Room and was found to have a potassium of 7.7. She states  she has been compliant with her diet. The patient received all temporizing measures with calcium gluconate, insulin and Kayexalate. She underwent urgent hemodialysis and was given dialysis treatment x 2, and her potassium came down to 5.4. Thereafter, the patient did get another round of hemodialysis prior to discharge. Nephrology consultation was made with Dr. Candiss Norse. 3.  Hypertension, poorly controlled. Her home meds were continued. Much better after dialyzing the patient x 2. All her home meds were resumed.  4.  End-stage renal disease. The patient is on hemodialysis. She is recommended to continue her hemodialysis as outpatient.   Hospital stay otherwise remained stable. The patient remained a FULL CODE.   TIME SPENT: 40 minutes.  ____________________________ Hart Rochester Posey Pronto, MD sap:sb D: 06/09/2013 07:43:39 ET T: 06/09/2013 08:23:48 ET JOB#: [id number]  cc: Mackensie Pilson A. Posey Pronto, MD, <Dictator> Ilda Basset MD ELECTRONICALLY SIGNED 06/15/2013 19:07

## 2015-03-18 NOTE — H&P (Signed)
PATIENT NAME:  Michele Nash, Michele Nash MR#:  [id number] DATE OF BIRTH:  02-26-1956  DATE OF ADMISSION:  11/06/2013  PRESENTING COMPLAINT: Shortness of breath and wheezing.   PRIMARY CARE PHYSICIAN: Dr. Rosario Jacks.   PRIMARY NEPHROLOGIST:  [employer].   HISTORY OF PRESENT ILLNESS: Michele Nash is a 59 year old African American female who has history of end-stage renal disease on hemodialysis Monday, Wednesday, Friday, COPD with ongoing tobacco abuse, borderline diabetes, diet-controlled, and history of chronic anemia, who comes to the Emergency Room after she woke up this morning feeling very short of breath,  took her breathing treatment, did not feel better. Came to the Emergency Room where she was found to have hypoxia with sats done in the 80s. She received a breathing treatment. Chest x-ray shows volume overload with possible pulmonary edema and possible mild COPD flare. The patient missed her dialysis on Wednesday, where she had developed some diarrhea. Her diarrhea is resolved at this time. The patient complains of some chest tightness, which is likely due to her shortness of breath. She is being admitted for acute hypoxic respiratory failure, pulmonary edema, diastolic congestive heart failure and end-stage renal disease, who missed dialysis on Wednesday.   She also has malignant hypertension with blood pressure of 215/109.   PAST MEDICAL HISTORY: 1.  COPD. 2.  ESRD on hemodialysis Monday, Wednesday, Friday. Her primary nephrologist is [employer].  3.  Chronic anemia.  4.  History of bronchitis and sinusitis.  5.  Ongoing tobacco abuse.  6.  Diabetes which is diet-controlled.   Echo shows EF of 60% to 65%, July 2014, with normal LV systolic function, mildly dilated left atrium.   MEDICATIONS: 1.  Zetia 10 mg daily.  2.  Sensipar 30 mg daily.  3.  ProAir HFA 2 puffs 4 times a day as needed.  4.  Omeprazole 20 mg daily.  5.  Norco 325/5, 1 tablet every 6  hours.  6.  Losartan 100 mg p.o. daily.  7.  Lasix 40 mg once a day on nondialysis days.  8.  Carvedilol 12.5 mg b.i.d.  9.  Calcium acetate 667, 2 tablets 3 times a day.  10.  Lopressor 1.5 mg 1 tablet b.i.d.   ALLERGIES: SULFA DRUGS.   SOCIAL HISTORY: Lives at home. Continues to smoke about 7 cigarettes a day. Denies any alcohol. She lives with her husband.   FAMILY HISTORY: Mother had complications of heart disease. Father died of malignancy.   PAST SURGICAL HISTORY: 1.  Left ovarian cyst surgery.  2.  Left upper extremity AV fistula.  3.  Ankle repair status post fracture.   REVIEW OF SYSTEMS:  CONSTITUTIONAL: No fever. Positive fatigue, weakness, and no weight gain, weight loss.  HEENT: No glaucoma, cataract, blurred vision or conjunctivitis. No sinusitis or chronic bronchitis.  RESPIRATORY: Positive for shortness of breath, cough and COPD.  CARDIOVASCULAR: Positive for chest tightness, elevated blood pressure/hypertension. No arrhythmias or palpitations.  GASTROINTESTINAL: No nausea, vomiting, diarrhea, abdominal pain.  GENITOURINARY: No dysuria, hematuria, frequency or renal calculus.  SKIN: No acne, rash or any lesions.  MUSCULOSKELETAL: Positive for back pain and positive for arthritis. No swelling of the joints or gout.  NEUROLOGIC: No CVA, TIA, vertigo or tremors or dysarthria.  PSYCHIATRIC: No anxiety or depression, bipolar disorder or schizophrenia. All other systems reviewed and negative.   PHYSICAL EXAMINATION: GENERAL: The patient is awake, alert, oriented x 3, mild to moderate distress due to shortness of breath.  VITAL SIGNS:  She is afebrile. Pulse is 73. Blood pressure is 205/95. Sats are 98% on 2 liters.  HEENT: Atraumatic, normocephalic. Pupils: PERRLA. EOM intact. Oral mucosa is moist.  NECK: Supple. No JVD. No carotid bruit.  RESPIRATORY: The patient has some mild labored breathing. There is bilateral decreased breath sounds in the bases, bilateral crackles  heard up to midlung. I could not appreciate any audible wheezing. No intercostal muscle retraction or use of any accessory muscles noted.  CARDIOVASCULAR: Both of the heart sounds are normal. Rate, rhythm regular. PMI not lateralized. No murmur heard.   ABDOMEN: Soft, benign, nontender. No organomegaly. Positive bowel sounds.  EXTREMITIES: Good pedal pulses. Good femoral pulses. No lower extremity edema.  SKIN: Warm and dry.  NEUROLOGIC: Grossly intact cranial nerves II through XII. No motor or sensory deficits.  PSYCHIATRIC: The patient is awake, alert, oriented x 3.   EKG: Shows normal sinus rhythm with PVCs. Mild LAD.   CBC is within normal limits.   Glucose is 130. BUN is 69. Creatinine is 8.8. Sodium 138. Potassium is 4.3. Chloride is 93. Bicarb is 38. Calcium is 8.6. Bilirubin is 0.4. SGPT 24, SGOT is 34. Total protein is 7.5. Troponin is 0.07. B-type natriuretic peptide is 10,415. Chest x-ray consistent with prominence of cardiac silhouette, pulmonary vascularity and pulmonary interstitial markings, which may reflect low-grade CHF. No focal pneumonia noted.   ASSESSMENT: A 59 year old Michele Nash with history of chronic obstructive pulmonary disease and ongoing tobacco abuse, borderline diet-controlled diabetes, hypertension and end-stage renal disease, who is on hemodialysis, comes in with increasing shortness of breath. She was found to have elevated BNP and chest x-ray consistent with pulmonary edema/increased pulmonary vascular congestion. She is being admitted with:  1.  Acute hypoxic respiratory failure with sats down in the 80s on arrival, secondary to possible volume overload/pulmonary edema due to missing her dialysis on Wednesday. The patient has acute diastolic congestive heart failure. She had echo in July 2014, which showed EF of 60% to 65% with normal LV systolic function. We will admit the patient to telemetry floor. The patient is going to be going for hemodialysis now, per Dr.  Holley Raring, case was discussed with him, and see if ultrafiltration can be done. The patient also received some breathing treatments, which we will continue around the clock. I do not see any indication for Solu-Medrol or any antibiotics at this time. This appears to be more so due to heart failure.  2.  Pulmonary edema in the context of end-stage renal disease with malignant hypertension and missing dialysis on past Wednesday.  The patient will get emergent dialysis. She is placed on Nitro-Bid ointment. We will resume all her home meds and give p.r.n. hydralazine for blood pressure greater than systolic 893.  3.  Elevated troponin.  Appears demand ischemia secondary to pulmonary edema and congestive heart failure, acute diastolic. We will continue to monitor and cycle cardiac enzymes x 3. The patient is already on losartan and Coreg.  4.  History of chronic back pain. P.r.n. Norco.  5.  Anemia of chronic disease. Hemoglobin is stable.  6.  Malignant hypertension secondary to pulmonary edema and congestive heart failure. We will continue Coreg, losartan and Lasix. The patient will be placed on IV hydralazine p.r.n.  7.  Deep vein thrombosis prophylaxis, subQ heparin.  8.  Tobacco abuse. The patient is advised on smoking cessation, about 4 minutes spent on smoking cessation. The patient states she is on nicotine patch and working towards it.  Further workup according to the patient's clinical course. Hospital admission plan was discussed with the patient and the patient's family members.   She is a FULL CODE.   CRITICAL TIME SPENT: 55 minutes.    ____________________________ Hart Rochester Posey Pronto, MD sap:dmm D: 11/06/2013 12:40:38 ET T: 11/06/2013 13:19:34 ET JOB#: [id number]  cc: Reagen Goates A. Posey Pronto, MD, <Dictator> Isla Pence, MD [place] MD ELECTRONICALLY SIGNED 11/12/2013 10:55

## 2015-03-18 NOTE — Op Note (Signed)
PATIENT NAME:  LOYOLA, SANTINO MR#:  [id number] DATE OF BIRTH:  [birth year]  DATE OF PROCEDURE:  05/15/2013  PREOPERATIVE DIAGNOSIS: End-stage renal disease with functional permanent dialysis access and no longer needing PermCath.  POSTOPERATIVE DIAGNOSIS: End-stage renal disease with functional permanent dialysis access and no longer needing PermCath.  PROCEDURE PERFORMED: Removal of right jugular PermCath.   SURGEON: Haze Boyden, PA-C  ANESTHESIA:  Local.   ESTIMATED BLOOD LOSS:  Minimal.   INDICATION FOR THE PROCEDURE:  The patient is a 59 year old African American female with end-stage renal disease. Her fistula is functional and no longer needs PermCath. Therefore, this will be removed.   DESCRIPTION OF PROCEDURE: The patient is brought to the vascular and interventional radiology area and positioned supine. The right neck and chest and existing catheter were sterilely prepped and draped and a sterile surgical field was created. The area was locally anesthetized copiously with 1% lidocaine. Hemostats were used to help dissect out the cuff. The catheter was then removed in its entirety without difficulty with gentle traction. Pressure was held at the base of the neck. Sterile dressing was placed. The patient tolerated the procedure well. No complications.  ____________________________ Marin Shutter Doniqua Saxby, PA-C cnh:sb D: 05/15/2013 09:46:44 ET T: 05/15/2013 10:09:01 ET JOB#: [id number]  cc: Marin Shutter. Ali Mclaurin, PA-C, <Dictator> Amador PA ELECTRONICALLY SIGNED 05/21/2013 8:47

## 2015-03-18 NOTE — H&P (Signed)
PATIENT NAME:  Michele Nash, Michele Nash MR#:  [id number] DATE OF BIRTH:  1956-06-11  DATE OF ADMISSION:  09/28/2013  PRIMARY CARE PHYSICIAN:  [name], MD  NEPHROLOGIST: Murlean Iba, MD  EMERGENCY ROOM PHYSICIAN: Lenise Arena, MD   CHIEF COMPLAINT: Shortness of breath.   HISTORY OF PRESENT ILLNESS: This is a 59 year old female patient with history of ESRD on hemodialysis Monday, Wednesday and Friday, hypertension, diet-controlled diabetes mellitus and ongoing tobacco abuse came in because of shortness of breath. The patient started to have shortness of breath since yesterday morning. The patient did not have any cough or fever. Came in here because of shortness of breath and chest tightness. Supposed to have hemodialysis today. Because of shortness of breath came to Emergency Room. In the ER, found to have O2 sats that were like 88% on room air. During my visit sats dropped to 74 on room air. The patient immediately started back on 2 liters. Saturation went up to 89 to 90%. The patient received a dose of Levaquin along with 40 mg of prednisone. Then the patient's wheezing got slightly better. Still has shortness of breath and when she talks the saturations are dropping to like 86 to 87%. The patient's blood pressure also is high around 220/110. I spoke with Dr. Candiss Norse to arrange for urgent hemodialysis today. The patient denies any other complaints. Denies any recent history of travel. Last hemodialysis was on Friday. The patient has no dizziness. Does have some shaking of extremities noted. She says that she was started on antidepressant a week ago. Since then she is having those involuntary jerky movements of the legs.   PAST MEDICAL HISTORY: Significant for hypertension, ESRD, diet-controlled diabetes mellitus, recent admission from July 13th to 14th, also has history of COPD, hyperparathyroidism, hyperlipidemia, chronic back pain, GERD and anemia of chronic kidney disease.   PAST SURGICAL  HISTORY: Significant for left ovary and cyst surgery, left upper extremity AV fistula and ankle repair status post fracture.  ALLERGIES: SULFA.  SOCIAL HISTORY: Continues to smoke one-half per day. No drinking. No drugs. Married. Lives with her husband.   FAMILY HISTORY: Mother had complications of heart disease. Father died of malignancy.   MEDICATIONS:  1.  Calcium acetate 667 mg 2 capsules p.o. t.i.d.  2.  Coreg 12.5 mg p.o. b.i.d.  3.  Lasix 40 mg p.o. daily. 4.  Losartan 100 mg p.o. daily. 5.  Norco 5/325 mg 1 tablet every 6 hours.  6.  Omeprazole 20 mg daily.  7.  Pro Air 90 mcg 2 puffs 4 times daily. 8.  Sensipar 30 mg once a day.  9.  Zetia 10 mg p.o. daily.   REVIEW OF SYSTEMS: CONSTITUTIONAL: Has no fever, no fatigue, no weakness.  EYES: No blurred vision.  ENT: No tinnitus. No epistaxis. No difficulty swallowing.  RESPIRATORY: Does have shortness of breath and COPD. CARDIOVASCULAR: No chest pain. No orthopnea. No PND. The patient has no palpitations.  GASTROINTESTINAL: No nausea. No vomiting. No abdominal pain. No loss of appetite.  GENITOURINARY: No dysuria. The patient is a hemodialysis patient.  ENDOCRINE: No polyuria. No nocturia. The patient has diet-controlled diabetes.  INTEGUMENTARY: No skin rashes.  HEMATOLOGIC: Has anemia of chronic disease.  MUSCULOSKELETAL: Has chronic back pain.  NEUROLOGIC: No numbness or weakness.  PSYCHIATRIC: No anxiety or insomnia.   PHYSICAL EXAMINATION: VITAL SIGNS: Temperature 98.7 Fahrenheit, heart rate 81 and blood pressure 186/90 when she came to triage, but during my visit the blood pressure was 220/110. Sats  dropped to 74% on room air. On 2 liters sats are around 88%.  GENERAL: The patient is alert, awake and oriented. Unable to speak full sentences because of shortness of breath.  HEENT: PERRLA. Extraocular movements intact. No conjunctivitis. Hearing is intact. No pharyngeal erythema.  NECK: Supple. No JVD. No carotid  bruit. LUNGS: Bilateral expiratory wheeze present in all lung fields. The patient also has rales mainly in both lungs basilar part.  HEART:  S1 and S2 regular. No murmurs.  ABDOMEN: Soft, nontender, obese. Bowel sounds present. No hernias.  MUSCULOSKELETAL: Strength 5/5 in the upper and lower extremities. Sensation is intact. DTRs 2+ bilateral.  SKIN: No rashes, well hydrated.  NEUROLOGIC: Cranial nerves II through XII intact. DTRs 2 + bilaterally. Sensations are intact. No dysarthria or aphagia.  PSYCHIATRIC: Alert and oriented to time, place and person.   LABORATORY AND DIAGNOSTICS: PH 7.38, pCO2 55, pO2 49, FiO2 32. Bicarbonate is 32.5. Sats 85.4% on room air.   A chest x-ray shows pulmonary edema and also interstitial infiltrate versus infectious versus inflammatory.   Troponin 0.03. WBC 10.9, hemoglobin 10.9, hematocrit 32.1, platelets 223. Electrolytes: Sodium 136, potassium 5.3, chloride 99, bicarb 33, BUN 60, creatinine 8.91 and glucose 95.   EKG: Normal sinus rhythm at 79 beats per minute.   ASSESSMENT AND PLAN: This is a 59 year old female patient with acute respiratory failure secondary to chronic obstructive pulmonary disease exacerbation and also pulmonary edema in the context of endstage renal disease.  1.  Respiratory failure and chronic obstructive pulmonary disease she is on oxygen along with Solu-Medrol, DuoNebs, Rocephin and Zithromax. The patient was advised to quit smoking.  2.  Pulmonary edema in the context of endstage renal disease with uncontrolled hypertension. I spoke with nephrology. Plan to have emergent hemodialysis. Also start her on losartan and Coreg add hydralazine p.r.n. for systolic blood pressure more than 160/90. Hopefully dialysis will help her with decrease in blood pressure and also increasing her oxygen sats. 3.  History of chronic back pain. She is on Norco.  4.  Anemia of chronic disease. Hemoglobin is stable.  5.  Malignant hypertension secondary to  pulmonary edema. The patient is on Coreg and losartan. Added hydralazine. Urgent hemodialysis is being arranged.   TIME SPENT: About 55 minutes.  ____________________________ [name], MD sk:sb D: 09/28/2013 16:20:19 ET T: 09/28/2013 16:36:49 ET JOB#: [id number]  cc: [name], MD, <Dictator> [name] MD ELECTRONICALLY SIGNED 10/24/2013 22:19 [name] MD ELECTRONICALLY SIGNED 10/26/2013 12:24

## 2015-03-18 NOTE — Op Note (Signed)
PATIENT NAME:  Michele Nash, Michele Nash MR#:  [id number] DATE OF BIRTH:  [birth year]  DATE OF PROCEDURE:  09/09/2013  PREOPERATIVE DIAGNOSES: 1.  Complication arteriovenous dialysis device.  2.  End-stage renal disease requiring hemodialysis.   POSTOPERATIVE DIAGNOSES:   1.  Complication arteriovenous dialysis device.  2.  End-stage renal disease requiring hemodialysis.   PROCEDURES PERFORMED: 1.  Contrast injection, left arm brachiocephalic fistula.  2.  Percutaneous transluminal angioplasty to 8 mm, midportion left brachiocephalic fistula.   SURGEON: Katha Cabal, M.D.   SEDATION: Versed 3 mg, fentanyl 100 mcg administered IV. Continuous ECG, pulse oximetry and cardiopulmonary monitoring is performed throughout the entire procedure by the interventional radiology nurse. Total sedation time is 45 minutes.   ACCESS: A 6-French sheath, left brachiocephalic fistula, antegrade direction.   CONTRAST USED: Isovue 25 mL.   FLUOROSCOPY TIME: 0.6 minutes.   INDICATIONS: [name] is a 59 year old woman who presents with worsening of problems and complications related to her dialysis access. She has had prolonged bleeding. Physical examination as well as noninvasive studies have demonstrated stricture in the midportion, and she is undergoing angiography with the hope for intervention. Risks and benefits are reviewed. All questions answered. The patient agrees to proceed.   DESCRIPTION OF PROCEDURE: The patient is taken to special procedures and placed in the supine position. After adequate sedation is achieved, the left arm is extended palm upward and prepped and draped in a sterile fashion. 1% lidocaine is infiltrated in the soft tissues near the antecubital fossa and access is obtained with a micropuncture needle and microwire followed by microsheath, J-wire followed by a 6-French sheath. Hand injection of contrast is then used to demonstrate the fistula itself,  as well as the central veins. After  review of the images, 3000 units of heparin is given. A Magic Torque wire is advanced, and an 8 x 6 balloon is advanced across the midportion and across the narrowed area. It is inflated to 16 atmospheres for 1 minute. Followup angiography demonstrates resolution of the stricture. There is, however, a persistence of the pseudoaneurysm, which is expected. Pursestring suture is placed around the sheath and it is removed. Light pressure is held. There are no immediate complications.   INTERPRETATION: Initial views demonstrate the fistula is patent. 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 There is some narrowing associated with this more proximally; however, at the level of the deltoid, the vein is smooth and measures approximately 7 to 8 mm in diameter and free of any strictures or stenoses. The cephalic subclavian confluence is widely patent, and the central veins are widely patent. Arterial anastomosis is patent. Following angioplasty to 8 mm, there is resolution of the stenotic area.     ____________________________ Katha Cabal, MD ggs:dmm D: 09/09/2013 17:14:45 ET T: 09/09/2013 20:07:53 ET JOB#: [id number]  cc: Katha Cabal, MD, <Dictator> Katha Cabal MD ELECTRONICALLY SIGNED 10/02/2013 8:13

## 2015-03-18 NOTE — H&P (Signed)
PATIENT NAME:  Michele Nash, MITTEN MR#:  [id number] DATE OF BIRTH:  1956-02-23  DATE OF ADMISSION:  06/02/2013  PRIMARY CARE PHYSICIAN: Dr. Murlean Iba from nephrology.   CHIEF COMPLAINT: Shortness of breath and weakness after dialysis today.   HISTORY OF PRESENT ILLNESS: This is a 59 year old female who presents to the hospital from dialysis after finishing further treatment due to nausea, vomiting, lethargy and also shortness of breath. The patient says that she was almost 10 pounds over her dry weight at dialysis today. They did take some extra fluid off at dialysis today but shortly after dialysis, she became very weak, lethargic, and therefore was sent over to the ER for further evaluation. The patient says that she has been having a cough productive with some clear/yellow sputum over the past few days, also feeling worse exertional dyspnea. She denies any peripheral edema. She has some baseline 2-pillow orthopnea. She does have a long-time history of tobacco abuse. In the Emergency Room, the patient was noted to be lethargic, but also noted to have an elevated BNP as high as 49,000. Her BNP in April of this past year was 800. There was some suspicion that she was in congestive heart failure. She was also noted to have a mild bronchitis and acute COPD exacerbation. Hospitalist services were therefore contacted for further treatment and evaluation.   REVIEW OF SYSTEMS:    CONSTITUTIONAL: No documented fever. No weight loss. Positive 10-pound weight gain.  EYES: No blurry or double vision.  EARS, NOSE, THROAT: No tinnitus. No postnasal drip. No redness of oropharynx.  RESPIRATORY: Positive cough. No wheeze. No hemoptysis. Positive dyspnea.  CARDIOVASCULAR: No chest pain, no orthopnea, no palpitations, no syncope.  GASTROINTESTINAL: No nausea, no vomiting, no diarrhea, no abdominal pain, no melena or hematochezia.  GENITOURINARY: No dysuria, no hematuria.  ENDOCRINE: No polyuria or nocturia. No heat  or cold intolerance.  HEMATOLOGIC: No anemia. No bruising. No bleeding.  INTEGUMENTARY: No rashes. No lesions.  MUSCULOSKELETAL: No arthritis. No swelling. No gout.  NEUROLOGIC: No numbness. No tingling. No ataxia. No seizure-type activity.  PSYCHIATRIC: No anxiety. No insomnia. No ADD.   PAST MEDICAL HISTORY: Consistent with end-stage renal disease, on hemodialysis Tuesday, Thursday, Saturday; hypertension, hyperlipidemia, secondary hyperparathyroidism, COPD with ongoing tobacco abuse.   ALLERGIES: SULFA DRUGS.   SOCIAL HISTORY: Still smokes about 1/2 pack per day, has been smoking for the past 30 to 40 years. No alcohol abuse. No illicit drug abuse. Lives at home with her husband.   FAMILY HISTORY: Significant for mother died from complications of heart disease. Father died from malignancy of unknown type.   CURRENT MEDICATIONS: Coreg 12.5 mg daily, metoprolol tartrate 50 mg b.i.d., losartan 100 mg daily, Lipitor 10 mg daily, Lasix 40 mg on nondialysis days, calcium acetate 2 capsules t.i.d. with meals, Benadryl daily as needed for allergies, prednisone 20 mg daily, albuterol inhaler as needed 2 puffs 4 times daily, Sensipar 30 mg daily.   PHYSICAL EXAMINATION:  VITAL SIGNS: Presently, temperature is 98.7, pulse 62, respirations 20, blood pressure 179/97, sats 96% on room air.  GENERAL: She is a pleasant-appearing female in no apparent distress.  HEENT: Atraumatic, normocephalic. Extraocular muscles are intact. Pupils equal and reactive to light. Sclerae anicteric. No conjunctival injection. No pharyngeal erythema.  NECK: Supple. There is no jugular venous distention, no bruits, no lymphadenopathy, no thyromegaly.  HEART: Regular rate and rhythm. No murmurs, no rubs, no clicks.  LUNGS: She has some prolonged inspiratory and expiratory phase, minimal end-expiratory  wheezing at the bases bilaterally. No rhonchi, no rales. Negative use of accessory muscles. No dullness to percussion.  ABDOMEN:  Soft, flat, nontender, nondistended. Has good bowel sounds. No hepatosplenomegaly appreciated.  EXTREMITIES: No evidence of any cyanosis, clubbing or peripheral edema. Has +2 pedal and radial pulses bilaterally.  NEUROLOGIC: The patient is alert, awake, oriented x 3 with no focal motor or sensory deficits appreciated bilaterally.  SKIN: Moist and warm with no rash appreciated.  LYMPHATIC: There is no cervical or axillary lymphadenopathy.   LABORATORY AND RADIOLOGICAL DATA: Serum glucose of 108, BUN 16, creatinine 4. BNP of 48,946. Sodium 136, potassium 4.2, chloride 102, bicarbonate 26. The patient's LFTs are within normal limits. Troponin 0.02. White cell count 8.7, hemoglobin 11.5, hematocrit 36.6, platelet count 279.   The patient did have a chest x-ray done, the results of which are still currently pending.   ASSESSMENT AND PLAN: This is a 59 year old female with a history of hypertension, end-stage renal disease on hemodialysis, hypertensive renal disease, chronic obstructive pulmonary disease with ongoing tobacco abuse, secondary hyperparathyroidism, who presents to the hospital due to weakness, shortness of breath after dialysis today. 1.  Shortness of breath. I suspect this is likely related to acute chronic obstructive pulmonary disease exacerbation from a mild bronchitis and from ongoing tobacco abuse. There is also concern that she has some underlying congestive heart failure, as she has a significant elevated BNP. It is up to 49,000. Her previous one was in the 800s. The patient although has no history of congestive heart failure. She did have about a 10-pound weight gain from her dry weight at dialysis today, although, extra fluid was removed. I will get a 2-dimensional echocardiogram to see if she truly has a cardiomyopathy. Her last echo was in 2011. I will start her on oral prednisone taper for her chronic obstructive pulmonary disease exacerbation. Continue her Levaquin and continue some  DuoNebs around the clock. She likely would benefit to be assessed for home oxygen prior to discharge given her long-term tobacco abuse.  2.  End-stage renal disease, on hemodialysis. I will get a nephrology consult. Dialysis is on Tuesday, Thursday, Saturday.  3.  Chronic obstructive pulmonary disease with ongoing tobacco abuse. Continue oral prednisone taper, Levaquin. DuoNebs around the clock. I will also consider starting her on some Advair.  4.  Hypertension. Continue Coreg. Continue metoprolol. Continue losartan. The patient is  presently hemodynamically stable.  5.  Hyperlipidemia. Continue atorvastatin. 6.  Secondary hyperparathyroidism. Continue Sensipar and calcium acetate.   CODE STATUS: The patient is a full code.   TIME SPENT: 45 minutes.    ____________________________ Belia Heman. Verdell Carmine, MD vjs:jm D: 06/02/2013 21:13:43 ET T: 06/02/2013 22:12:51 ET JOB#: [id number]  cc: Belia Heman. Verdell Carmine, MD, <Dictator> Henreitta Leber MD ELECTRONICALLY SIGNED 06/08/2013 20:39

## 2015-03-18 NOTE — Consult Note (Signed)
General Aspect Acute renal failure   Present Illness the patient is a  59 y.o woman who presented to the ER at about 6 am this morning with increasing SOB.  Work up demonstrated acute renal failure.  She had a history of chronic renal failure but has not seen a physician in more than 1 yr.  Additional complaints at the time of evauation are tremmor, chest pain and shoulder pain.    Sulfa drugs: Swelling  Case History:  Family History Non-Contributory   Social History negative tobacco, negative ETOH, negative Illicit drugs   Review of Systems:  Fever/Chills No   Cough No   Sputum No   Abdominal Pain No   Diarrhea No   Constipation No   Nausea/Vomiting Yes   SOB/DOE Yes   Chest Pain Yes   Telemetry Reviewed NSR   Physical Exam:  GEN well developed, well nourished   HEENT hearing intact to voice, dry oral mucosa   NECK supple  trachea midline   RESP normal resp effort  no use of accessory muscles   CARD regular rate  positive JVD   ABD denies tenderness  nondistended   EXTR negative cyanosis/clubbing, positive edema   SKIN No rashes, No ulcers, skin turgor good   NEURO cranial nerves intact, motor/sensory function intact   PSYCH alert, A+O to time, place, person   Nursing/Ancillary Notes: **Vital Signs.:   04-Apr-14 11:55  Vital Signs Type Admission  Temperature Temperature (F) 98.9  Celsius 37.1  Pulse Pulse 75  Respirations Respirations 22  Systolic BP Systolic BP 163  Diastolic BP (mmHg) Diastolic BP (mmHg) 845  Mean BP 141  Pulse Ox % Pulse Ox % 97  Pulse Ox Activity Level  With exertion  Oxygen Delivery Room Air/ 21 %   Thyroid:  04-Apr-14 08:00   Thyroid Stimulating Hormone  0.186 (0.45-4.50 (International Unit)  ----------------------- Pregnant patients have  different reference  ranges for TSH:  - - - - - - - - - -  Pregnant, first trimetser:  0.36 - 2.50 uIU/mL)  Hepatic:  04-Apr-14 08:00   Bilirubin, Total 0.3  Alkaline  Phosphatase  191  SGPT (ALT) 13  SGOT (AST) 19  Total Protein, Serum 7.5  Albumin, Serum 3.6  General Ref:  04-Apr-14 08:00   Vitamin B12, Serum ========== TEST NAME ==========  ========= RESULTS =========  = REFERENCE RANGE =  VITAMIN B12  Vitamin B12 Vitamin B12                     [   852 pg/mL            ]           211-946               [facility]            No: [id number]           [address], [place], [place] [address]           Lindon Romp, MD         [phone]   Result(s) reported on 28 Feb 2013 at 05:17AM.    18:45   Parathyroid Hormone, Intact ========== TEST NAME ==========  ========= RESULTS =========  = REFERENCE RANGE =  PTH INTACT  PTH, Intact PTH, Intact                     [H  1315 pg/mL           ]  [facility]            No: [id number]           [address] [place], [place] [address]           Lindon Romp, MD         [phone]   Result(s) reported on 28 Feb 2013 at 08:17AM.  HBsAg ========== TEST NAME ==========  ========= RESULTS =========  = REFERENCE RANGE =  HEPATITIS B SURFACE AG  HBsAg Screen HBsAg Screen                    [   Negative             ]          Negative               [facility]             No: [id number]           [address], [place], [place] [address]           Lindon Romp, MD         [phone]   Result(s) reported on 28 Feb 2013 at 09:47AM.    19:40   Hepatitis B Surface Antibody, Qual ========== TEST NAME ==========  ========= RESULTS =========  = REFERENCE RANGE =  HEPATITIS B SURF.AB,QUAL  Hep B Surface Ab Hep B Surface Ab, Qual          [   Non Reactive         ]                                                Non Reactive: Inconsistent with immunity,                                            less than 10 mIU/mL                              Reactive:     Consistent with immunity,                                             greater than 9.9 mIU/mL               [facility]            No: [id number]           [address] [place], [place] [address]           Lindon Romp, MD         [phone]   Result(s) reported on 28 Feb 2013 at 09:47AM.  Hepatitis B Core Antibody Total ========== TEST NAME ==========  ========= RESULTS =========  = REFERENCE RANGE =  HEPATITIS B CORE AB,TOTL  Hep B Core Ab, Tot Hep B Core Ab, Tot              [  Negative             ]          Negative               [facility]      No: [id number]           [address] [place], [place] [address]           Lindon Romp, MD         [phone]   Result(s) reported on 28 Feb 2013 at 09:47AM.  Cardiology:  04-Apr-14 06:23   Ventricular Rate 87  Atrial Rate 87  P-R Interval 158  QRS Duration 84  QT 442  QTc 531  P Axis 37  R Axis 8  T Axis 77  ECG interpretation Normal sinus rhythm Possible Left atrial enlargement Left ventricular hypertrophy Nonspecific ST and T wave abnormality Prolonged QT Abnormal ECG When compared with ECG of 09-Sep-2011 01:50, No significant change was found ----------unconfirmed---------- Confirmed by OVERREAD, NOT ([id number]), editor PEARSON, BARBARA ([id number]) on 02/27/2013 12:38:21 PM  Routine Chem:  04-Apr-14 08:00   Magnesium, Serum  1.4 (1.8-2.4 THERAPEUTIC RANGE: 4-7 mg/dL TOXIC: > 10 mg/dL  -----------------------)  Glucose, Serum  104  BUN  74  Creatinine (comp)  9.39  Sodium, Serum 139  Potassium, Serum  2.9  Chloride, Serum 101  CO2, Serum 27  Calcium (Total), Serum  5.7  Anion Gap 11  Osmolality (calc) 300  eGFR (African American)  5  eGFR (Non-African American)  4 (eGFR values <60m/min/1.73 m2 may be an indication of chronic kidney disease (CKD). Calculated eGFR is useful in patients with stable renal function. The eGFR calculation will not be reliable in acutely ill patients when serum creatinine is changing rapidly. It is not useful in   patients on dialysis. The eGFR calculation may not be applicable to patients at the low and high extremes of body sizes, pregnant women, and vegetarians.)  Iron Binding Capacity (TIBC) 331  Unbound Iron Binding Capacity 296  Iron, Serum  35  Iron Saturation 11 (Result(s) reported on 27 Feb 2013 at 11:22AM.)  Ferritin ([facility] 51 (Result(s) reported on 27 Feb 2013 at 121:97JO)  Folic Acid, Serum 5.6 (Result(s) reported on 27 Feb 2013 at 11:37AM.)  Result Comment CALCIUM - RESULTS VERIFIED BY REPEAT TESTING.  - NOTIFIED OF CRITICAL VALUE  - HP TO NATASHA BROADNAX @0843 /02/27/13   - READ-BACK PROCESS PERFORMED.  Result(s) reported on 27 Feb 2013 at 08:15AM.  B-Type Natriuretic Peptide ([facility]  897 (Result(s) reported on 27 Feb 2013 at 08:58AM.)    18:45   Cholesterol, Serum 180  Triglycerides, Serum 101  HDL (INHOUSE)  37  VLDL Cholesterol Calculated 20  LDL Cholesterol Calculated  123 (Result(s) reported on 27 Feb 2013 at 07:45PM.)  Phosphorus, Serum  5.5 (Result(s) reported on 27 Feb 2013 at 07:12PM.)  Cardiac:  04-Apr-14 08:00   Troponin I 0.04 (0.00-0.05 0.05 ng/mL or less: NEGATIVE  Repeat testing in 3-6 hrs  if clinically indicated. >0.05 ng/mL: POTENTIAL  MYOCARDIAL INJURY. Repeat  testing in 3-6 hrs if  clinically indicated. NOTE: An increase or decrease  of 30% or more on serial  testing suggests a  clinically important change)  Routine UA:  04-Apr-14 11:30   Color (UA) Straw  Clarity (UA) Clear  Glucose (UA) 150 mg/dL  Bilirubin (UA) Negative  Ketones (UA) Negative  Specific Gravity (UA) 1.008  Blood (UA) 1+  pH (UA) 9.0  Protein (UA)  30 mg/dL  Nitrite (UA) Negative  Leukocyte Esterase (UA) 2+ (Result(s) reported on 27 Feb 2013 at 11:52AM.)  RBC (UA) 2 /HPF  WBC (UA) 27 /HPF  Bacteria (UA) 1+  Epithelial Cells (UA) 2 /HPF (Result(s) reported on 27 Feb 2013 at 11:52AM.)  Routine Hem:  04-Apr-14 08:00   WBC (CBC) 8.3  RBC (CBC)  2.66  Hemoglobin (CBC)  8.0   Hematocrit (CBC)  24.1  Platelet Count (CBC) 282  MCV 90  MCH 30.1  MCHC 33.3  RDW 13.5  Neutrophil % 70.4  Lymphocyte % 18.6  Monocyte % 8.2  Eosinophil % 2.3  Basophil % 0.5  Neutrophil # 5.8  Lymphocyte # 1.5  Monocyte # 0.7  Eosinophil # 0.2  Basophil # 0.0 (Result(s) reported on 27 Feb 2013 at 08:15AM.)   XRay:    04-Apr-14 07:28, Chest PA and Lateral  Chest PA and Lateral   REASON FOR EXAM:    sob  COMMENTS:       PROCEDURE: DXR - DXR CHEST PA (OR AP) AND LATERAL  - Feb 27 2013  7:28AM     RESULT: Comparison is made to the study of 09 September 2011.    The lungs are clear. The heart and pulmonary vessels are normal. The bony   and mediastinal structures are unremarkable. There is no effusion. There   is no pneumothorax or evidence of congestive failure.    IMPRESSION:  No acute cardiopulmonary disease.    Dictation Site: 2    Verified By: Sundra Aland, M.D., MD    Impression 1. CRF        acute on chronic and has progressed to esrd will need hd        nephrology on consult        will place temp cath as the patient has eaten 2. c/l cp very atypical        serial enz, ekg non revelaing 3. hypokalemia        replace K+ per protocol 4. diabetes        sliding scale insulin        check [id number] 5. hyperlipdemia         statin therapy per medicine 6. copd           start advaair, prn nebs, mdi   Plan level 3 consult   Electronic Signatures: Hortencia Pilar (MD)  (Signed 05-Apr-14 12:51)  Authored: General Aspect/Present Illness, Allergies, History and Physical Exam, Vital Signs, Labs, Radiology, Impression/Plan   Last Updated: 05-Apr-14 12:51 by Hortencia Pilar (MD)

## 2015-03-18 NOTE — Discharge Summary (Signed)
PATIENT NAME:  Michele Nash, Michele Nash MR#:  [id number] DATE OF BIRTH:  1956-05-20  DATE OF ADMISSION:  11/06/2013 DATE OF DISCHARGE:  11/08/2013  DISCHARGE DIAGNOSES: 1.  Pulmonary edema, missed hemodialysis.  2.  Acute diastolic heart failure.  3.  Hypertension.  4.  Chronic anemia.  5.  End-stage renal disease, on hemodialysis.   CONDITION ON DISCHARGE: Stable.   CODE STATUS: Full code.   MEDICATIONS ON DISCHARGE: 1.  Calcium 667 mg oral capsule 2 capsules 3 times a day.  2.  Sensipar 30 mg oral tablet once a day.  3.  Lasix 40 mg once a day.  4.  Losartan 100 mg once a day.  5.  Zetia 10 mg oral once a day.  6.  Omeprazole 20 mg once a day.  7.  Carvedilol 12.5 mg oral tablet 2 times a day.  8.  Norco 1 tablet every 6 hours as needed.  9.  Alprazolam 0.5 mg oral tablet 2 times a day.  9.  Amlodipine 5 mg once a day.   DIET ON DISCHARGE: Renal diet, diet consistency regular.   ACTIVITY: As tolerated.   TIMEFRAME TO FOLLOWUP: Within 1 to 2 days. Routine hemodialysis followup with Dr. Anthonette Legato in clinic in hemodialysis center.   HISTORY OF PRESENT ILLNESS: As per Dr. Posey Pronto on 11/06/2013, a 58 year old African American female who had history of end-stage renal disease on hemodialysis Monday, Wednesday and Friday; COPD with ongoing tobacco abuse, borderline diabetes, diet-controlled, and history of chronic anemia who comes to the Emergency Room after she woke up this morning feeling very short of breath, took her breathing treatment, did not feel better so came to the Emergency Room, was found having hypoxia, saturation was in 80s. Chest x-ray showed volume overload, possible pulmonary edema and COPD flare. She missed her hemodialysis on last Wednesday because of some diarrhea which resolved after 1 day, was feeling chest tightness and blood pressure was 215/109.   HOSPITAL COURSE AND STAY: Nephrology consult was called in for urgent dialysis. Patient received hemodialysis that  evening. Blood pressure was still high so she was transferred to CCU with nicardipine IV drip and with that, within 24 hours blood pressure came under control. She received 1 more dialysis the next day to remove some more fluid and after that the patient's blood pressure remained stable. Her chest pain and was feeling perfectly fine. She agreed to be compliant with her hemodialysis now onwards and we readjusted the medication for her hypertension and decided to discharge her home.   OTHER MEDICAL ISSUES IN THIS HOSPITAL COURSE:  1.  Elevated troponin which was possibly demand ischemia.  It remained stable.  2.  History of chronic back pain. She was taking Norco, we continued the same in the hospital.  3.  Anemia of chronic disease. Hemoglobin was stable in the hospital.   [place]: Dr. Anthonette Legato for nephrology.   IMPORTANT LABORATORY AND RADIOLOGICAL RESULTS: Prominence of cardiac silhouette, pulmonary vascularity and pulmonary interstitial marking reflect low-grade CHF, no significant interval change since previous study. White cell count was 8.6 and hemoglobin was 12.5 on presentation. Creatinine was 8.87, potassium was 4.3, troponin 0.07. BNP was 10,400. Troponin remained same, 0.07, on further followup.   TOTAL TIME SPENT ON THIS DISCHARGE: 40 minutes.   ____________________________ Ceasar Lund Anselm Jungling, MD vgv:cs D: 11/09/2013 14:20:42 ET T: 11/09/2013 14:51:49 ET JOB#: [id number]  cc: Ceasar Lund. Anselm Jungling, MD, <Dictator> Munsoor Lilian Kapur, MD Vaughan Basta MD ELECTRONICALLY  SIGNED 11/16/2013 14:21

## 2015-03-18 NOTE — Op Note (Signed)
  DATE OF BIRTH:  Feb 24, 1956  DATE OF PROCEDURE:  02/27/2013  PREOPERATIVE DIAGNOSIS:  Acute renal insufficiency, requiring emergent hemodialysis.   POSTOPERATIVE DIAGNOSIS:  Acute renal insufficiency, requiring emergent hemodialysis.  PROCEDURE PERFORMED:  Insertion left IJ temporary dialysis catheter.   PROCEDURE PERFORMED BY:  Katha Cabal, MD  DESCRIPTION OF PROCEDURE:  The patient is positioned supine in her hospital bed with her neck extended, slightly rotated to the right, and the left neck is prepped and draped in a sterile fashion. Ultrasound is placed in a sterile sleeve. Jugular vein is identified. It is echolucent and compressible, indicating patency. Image is recorded for the permanent record. Under direct ultrasound visualization, Seldinger needle is inserted followed by the J-wire. Counterincision is made, dilator is passed over the wire, and a 24 cm double-lumen temporary dialysis catheter is advanced without difficulty. Both lumens aspirate and flush easily. They are packed with saline. A sterile dressing is applied after the catheter is secured to the neck with 2-0 nylon suture. Chest x-ray is pending.    ____________________________ Katha Cabal, MD ggs:mr D: 02/28/2013 12:37:23 ET T: 03/01/2013 00:08:30 ET JOB#: [id number]  cc: Katha Cabal, MD, <Dictator> Katha Cabal MD ELECTRONICALLY SIGNED 04/06/2013 13:42

## 2015-03-18 NOTE — H&P (Signed)
PATIENT NAME:  Michele Nash, Michele Nash MR#:  [id number] DATE OF BIRTH:  1956/05/31  DATE OF ADMISSION:  09/28/2013  ADDENDUM: The patient was advised to quit smoking, counseled for about 5 minutes. (will start > the nicotine patch.  ____________________________ [name], MD sk:sb D: 09/28/2013 16:22:21 ET T: 09/28/2013 16:35:28 ET JOB#: [id number]  cc: [name], MD, <Dictator> [name] MD ELECTRONICALLY SIGNED 10/16/2013 12:45

## 2015-03-18 NOTE — Op Note (Signed)
PATIENT NAME:  Michele Nash, HUMANN MR#:  [id number] DATE OF BIRTH:  [birth year]  DATE OF PROCEDURE:  03/03/2013  PREOPERATIVE DIAGNOSES: End-stage renal disease, requiring hemodialysis.   POSTOPERATIVE DIAGNOSES:  End-stage renal disease, requiring hemodialysis.   PROCEDURE PERFORMED: Insertion of right internal jugular cuffed, tunnelled dialysis catheter with fluoroscopic and ultrasound guidance.   SURGEON: Katha Cabal, M.D.   SEDATION: Versed 3 mg, plus fentanyl.   Continuous ECG, pulse oximetry and cardiopulmonary monitoring were performed throughout the entire procedure by the interventional radiology nurse.   Total sedation time was 30 minutes.   ACCESS: Right IJ.   FLUORO TIME: Less than 1 minute.   CONTRAST USED: None.   INDICATIONS: The patient is a 59 year old woman who presented to the Emergency Room last week with acute-on-chronic renal insufficiency and was initiated on dialysis. She is now undergoing placement of a Permacath so that she may go home and continue her therapy as an outpatient. Risks and benefits were reviewed. All questions were answered. The patient agrees to proceed.   PROCEDURE: The patient is taken to special procedures and placed in the supine position. After adequate sedation was achieved, the right neck and chest wall were prepped and draped in a sterile fashion. Lidocaine 1% was infiltrated in the soft tissues and ultrasound was placed in a sterile sleeve. Ultrasound was utilized secondary to lack of appropriate landmarks to avoid vascular injury. Jugular vein was identified. It was echolucent and compressible, indicating patency. Image was recorded under direct ultrasound visualization. Seldinger needle was inserted into the jugular vein. J-wire was advanced under fluoroscopy into the inferior vena cava. Counter-incision was created and a pocket made with blunt dissection. Dilator was passed over the wire and dilator and peel-away sheath were inserted.  Wire and dilator were removed and a  19 cm tip-to-cuff Cannon catheter was advanced through the peel-away sheath. The peel-away sheath was removed. Catheter was adjusted for proper tip position and then pulled subcutaneously. Hub assembly was connected. Both lumens aspirated and flushed easily. Each lumen was packed with 5000 of heparin.   The catheter was then secured to the skin with 0 silk, and the neck counter-incision was closed with 4-0 Monocryl. The patient tolerated the procedure well, and there were no immediate complications.    ____________________________ Katha Cabal, MD ggs:dm D: 03/03/2013 09:16:00 ET T: 03/03/2013 09:36:03 ET JOB#: [id number]  cc: Katha Cabal, MD, <Dictator> Katha Cabal MD ELECTRONICALLY SIGNED 04/06/2013 13:42

## 2015-03-19 NOTE — H&P (Signed)
PATIENT NAME:  Michele Nash, Michele Nash MR#:  [id number] DATE OF BIRTH:  1956-01-31  DATE OF ADMISSION:  03/01/2014  PRIMARY CARE PHYSICIAN: Dr. Rosario Jacks.   NEPHROLOGIST: Dr. Juleen China.   CHIEF COMPLAINT: Shortness of breath and twitching,   HISTORY OF PRESENT ILLNESS: This is a 59 year old female, who presents from home due to shortness of breath and twitching that began earlier today. The patient was with her husband and the husband called Dr. Rosario Jacks, who then asked her to come to the ER for further evaluation. The patient says she has been developing these twitching episodes over the past weekend,  progressively getting worse. She also noticed that her shortness of breath has progressively gotten worse over the past few days where she has significant short of breath on minimal exertion and even at rest. She admits to a cough, but nonproductive. No chills. No documented fever. No weight gain or weight loss. No nausea, no vomiting, no abdominal pain and no chest pain. The patient came to the Emergency Room today. Chest x-ray findings suggestive of fluid overload and pulmonary edema. Hospitalist services were contacted for further treatment and evaluation.  REVIEW OF SYSTEMS: CONSTITUTIONAL: No documented fever, no weight gain or weight loss. EYES: No blurred or double vision. ENT: No tinnitus. No postnasal drip. No redness of the oropharynx. RESPIRATORY: Positive cough. No wheeze, no hemoptysis. Positive dyspnea. CARDIOVASCULAR: No chest pain, no orthopnea, no palpitations, no syncope. GASTROINTESTINAL: No nausea, vomiting, diarrhea, abdominal pain. No melena or hematochezia. GENITOURINARY: No dysuria or hematuria. ENDOCRINE: No polyuria or nocturia. No heat or cold intolerance. HEMATOLOGIC: No anemia. No bruising. No bleeding.  INTEGUMENTARY: No rashes or lesions. MUSCULOSKELETAL: No arthritis, no swelling, no gout. NEUROLOGIC: No numbness or tingling. No ataxia. No seizure activity. PSYCHIATRIC: No anxiety. No  insomnia. No ADD.   PAST MEDICAL HISTORY: Consistent with end-stage renal disease on hemodialysis, Monday, Wednesday, Friday, hypertension, secondary hyperparathyroidism, anxiety, hyperlipidemia and COPD.   SOCIAL HISTORY: Does smoke about a few cigarettes per day, has smoked for the past 30 to 40 years. No alcohol abuse. No illicit drug abuse. Lives at home with her husband.   FAMILY HISTORY: Mother and father both deceased. The patient's father died from cancer of unknown type. Mother died from heart disease.   ALLERGIES: THE PATIENT IS ALLERGIC TO SULFA DRUGS, WHICH CAUSES ANAPHYLAXIS.   CURRENT MEDICATIONS: As follows: Xanax 0.5 mg b.i.d., amlodipine 5 mg daily, calcium acetate 2 caps t.i.d. with meals, 1 with snack, Coreg 12.5 mg b.i.d., Lasix 40 mg on non-dialysis days, losartan 100 mg daily, Norco 5/325, 1 tab q.6 hours as needed, omeprazole 20 mg daily, albuterol inhaler 2 puffs 4 times daily as needed, Sensipar 30 mg daily, sertraline 50 mg daily, Zetia 10 mg daily.   PHYSICAL EXAMINATION: Presently is as follows:  VITAL SIGNS: Temperature is 98.1, pulse 72, respirations 20, blood pressure 179/97, sats 99% on BiPAP.  GENERAL: She is a pleasant-appearing female in mild respiratory distress.  HEAD, EYES, EARS, NOSE, THROAT: Atraumatic, normocephalic. Extraocular muscles are intact. Pupils equal and reactive to light. Sclerae anicteric. No conjunctival injection. No pharyngeal erythema.  NECK: Supple. There is no jugular venous distention. No bruits, no lymphadenopathy, no thyromegaly.  HEART: Regular rate and rhythm. No murmurs. No rubs. No clicks.  LUNGS: She has bibasilar crackles with positive use of accessory muscle and dullness to percussion. No wheezing, no rhonchi.  ABDOMEN: Soft, flat, nontender and nondistended. Has good bowel sounds. No hepatosplenomegaly appreciated.  EXTREMITIES: No evidence of  any cyanosis, clubbing, or peripheral edema. Has +2 pedal and radial pulses  bilaterally. The patient has a left upper extremity AV fistula, which has good bruit and good thrill.   NEUROLOGICAL: The patient is alert, awake, and oriented x 3 with no focal motor or sensory deficits appreciated bilaterally.  SKIN: Moist and warm with no rashes.  LYMPHATIC: There is no cervical or axillary lymphadenopathy.  LABORATORY EXAM: Shows serum glucose of 133, BUN 55, creatinine 8.45, sodium 137, potassium 5.4, chloride 97, bicarbonate 36. The patient's LFTs are within normal limits. Troponin 0.02. White cell count 9.3, hemoglobin 11.7, hematocrit 35.7, platelet count 242. ABG showed a pH of 7.39, pCO2 63, pO2 56, sats 93%.   The patient did have a chest x-ray done, which showed worsening interstitial prominence bilateral, suspicious for pneumonitis versus interstitial edema.   ASSESSMENT AND PLAN: This is a 59 year old female with history of hypertension, end-stage renal disease on hemodialysis, secondary hyperparathyroidism, anxiety, hyperlipidemia, chronic obstructive pulmonary disease, who came to the hospital due to some twitching and also noted to be short of breath. 1.  Shortness of breath, likely this is related to volume overload and pulmonary edema. Also complicated with underlying chronic obstructive pulmonary disease. The patient will get hemodialysis to get extra fluid removed today. There is no evidence of chronic obstructive pulmonary disease exacerbation as the patient is not bronchospastic. For now, we will continue her Advair, add some Spiriva and p.r.n. DuoNeb. We will need to assess her for home oxygen prior to discharge.  2.  End-stage renal disease on hemodialysis. I will get a nephrology consult. The patient will get hemodialysis today.  3.  Chronic obstructive pulmonary disease. No acute exacerbation. Continue Advair, Spiriva and p.r.n. DuoNeb.  4.  Anxiety/depression. Continue with her Xanax and Zoloft.  5.  Gastroesophageal reflux disease. Continue Protonix.  6.   Secondary hyperparathyroidism. Continue with Sensipar and calcium acetate.   CODE STATUS The patient is a full code.   TIME SPENT: 50 minutes.   ____________________________ Belia Heman. Verdell Carmine, MD vjs:aw D: 03/01/2014 13:08:50 ET T: 03/01/2014 13:31:02 ET JOB#: [id number]  cc: Belia Heman. Verdell Carmine, MD, <Dictator> Henreitta Leber MD ELECTRONICALLY SIGNED 03/07/2014 18:47

## 2015-03-19 NOTE — Discharge Summary (Signed)
PATIENT NAME:  Michele Nash, Michele Nash MR#:  [id number] DATE OF BIRTH:  1956/07/25  DATE OF ADMISSION:  03/01/2014 DATE OF DISCHARGE:  03/03/2014  PRIMARY CARE PHYSICIAN:  Dr. Rosario Jacks.  FINAL DIAGNOSES:  1. Acute respiratory failure, which resolved.  2. Acute diastolic congestive heart failure.  3. End-stage renal disease.  4. Hypertension.  5. Chronic obstructive pulmonary disease.  6. Back pain, rib pain, and chest pain.    MEDICATIONS ON DISCHARGE: Include calcium acetate 667 mg 2 capsules 3 times a day and 1 with snacks, Sensipar 30 mg daily, Lasix 40 mg once a day on nondialysis days, losartan 100 mg daily, ProAir HFA 2 puffs 4 times a day as needed, Zetia 10 mg daily, omeprazole 20 mg daily, Coreg 12.5 mg twice a day, Norco 325/5 one tablet every 6 hours as needed for pain, Xanax 0.5 mg twice a day, amlodipine 5 mg daily, Zoloft 50 mg daily, Advair Diskus 250/50 one inhalation twice a day, Spiriva 18 mcg/inhalation 1 inhalation twice a day, prednisone 5 mg 3 tablets day 1, 2 tablets day 1, 1 tablet days 3, 4 and 5.   DIET: Renal diet, regular consistency.   ACTIVITY: As tolerated.  FOLLOWUP: At the dialysis Monday, Wednesday, Friday, in 1-2 weeks with Dr. Rosario Jacks.    HOSPITAL COURSE: The patient was admitted 03/01/2014 and discharged 03/03/2014. The patient came in with acute respiratory failure, shortness of breath, and twitching. Was given Advair, Spiriva, and DuoNeb and needed an urgent dialysis to remove fluid.   LABORATORY AND RADIOLOGICAL DATA DURING THE HOSPITAL COURSE: Included a phosphorus of 2.4, magnesium 2.2. Troponin negative. Glucose 133, BUN 55, creatinine 8.45, sodium 137, potassium 5.4, chloride 97, CO2 of 36, calcium 8.6. Liver function tests: Alkaline phosphatase slightly high at 173, ALT 28, AST 38. White blood cell count 9.3, H and H 11.7 and 35.2, platelet count of 242. ABG showed a pH of 7.39, pCO2 of 63, pO2 of 56, bicarbonate 38.1, oxygen saturation 93.8. That was on 28%  oxygen. Chest x-ray showed worsening interstitial prominence bilateral, suspicious for pneumonitis or interstitial edema. No infiltrate. EKG showed a normal sinus rhythm, left atrial enlargement, left ventricular hypertrophy. Hemoglobin upon discharge 11.8, creatinine 5.38.   HOSPITAL COURSE PER PROBLEM LIST:  1. For the patient's acute respiratory failure, this had resolved. Initially, she required BiPAP then was brought down to 4 liters and then down to 2 liters and now off oxygen. Acute respiratory failure had resolved. This was all secondary to acute diastolic congestive heart failure.  2. Acute diastolic congestive heart failure and fluid overload. The patient had a short dialysis on Friday and had a big salt load for Easter and went into heart failure. The patient received 3 dialyses here in the hospital on April 6th, 7th and 8th. Lungs were clear upon discharge. The patient is on Lasix, losartan, Coreg, and dialysis for fluid management.  3. End-stage renal disease on dialysis on Monday, Wednesday, Friday. Continue schedule. Next dialysis will be on Friday.  4. Hypertension. Blood pressure 139/87, stable on medications.  5. Chronic obstructive pulmonary disease, started on Spiriva and Advair.  6. Back pain, rib pain, and chest pain. This has been going on for 2-3 weeks. This could be inflammatory, so I did give a dose of Solu-Medrol and a quick prednisone taper. Follow up with Dr. Rosario Jacks for further workup on this. This is unlikely cardiac since the patient had this going on for 2 weeks and it is not secondary to the  heart failure since the lungs are clear at this point. Pain is more musculoskeletal with palpating over the posterior ribs, so we will see if Solu-Medrol helps.  7. I do recommend a sleep study as outpatient. I did get an overnight oximetry, but results at this time are still pending. This can be followed up by Dr. Rosario Jacks.   TIME SPENT ON DISCHARGE: 35 minutes.     ____________________________ Tana Conch. Leslye Peer, MD rjw:dd D: 03/03/2014 15:57:00 ET T: 03/03/2014 18:43:06 ET JOB#: [id number]  cc: Tana Conch. Leslye Peer, MD, <Dictator> Isla Pence, MD [place] MD ELECTRONICALLY SIGNED 03/06/2014 15:53

## 2015-03-19 NOTE — H&P (Signed)
PATIENT NAME:  Michele Nash, Michele Nash MR#:  [id number] DATE OF BIRTH:  1956/06/05  DATE OF ADMISSION:  03/19/2014  PRIMARY DOCTOR:  [name], MD  EMERGENCY ROOM PHYSICIAN:  Harvest Dark, MD  CHIEF COMPLAINT:  Chest pain.   HISTORY OF PRESENT ILLNESS:  This patient is a 59 year old female with history of ESRD on hemodialysis, developed chest pain after 2 hours of hemodialysis. The patient brought in by the EMS. According to the records, the patient woke up screaming that she was having chest pain and became unresponsive briefly. The patient chest pain resolved with the nitroglycerin given by the EMS, and the patient also received 4 baby aspirins. The patient right now resting comfortably and does not have chest pain anymore, and she received nitroglycerin here. The patient did not have any trouble breathing or cough or fever. The patient had extra fluid removal today because she missed hemodialysis on Wednesday.    PAST MEDICAL HISTORY:  Significant for history of ESRD on hemodialysis, hypertension. Recently was admitted from April 6th to April 8th because of heart failure due to missed hemodialysis session. The patient was admitted because of heart failure and discharged home in stable condition at that time. The patient's past medical history also includes COPD and back pain and anxiety.   HOME MEDICATIONS:  1.  Sensipar 30 mg p.o. daily.  2.  Lasix 40 mg p.o. on nondialysis days.  3.  Losartan 100 mg p.o. daily.  4.  ProAir 2 puffs 4 times daily as needed for wheezing.  5.  Zetia 10 mg daily.  6.  Omeprazole 20 mg p.o. daily.  7.  Coreg 12.5 mg p.o. b.i.d.  8.  Norco 5/325 one tablet every 4 to 6 hours as needed.  9.  Xanax 0.5 mg p.o. b.i.d.  10.  Amlodipine 5 mg p.o. daily.  11.  Advair Diskus 250/50 one b.i.d.  12.  Spiriva 18 mcg/inhalation daily.    The patient has hemodialysis on Monday, Wednesday, Friday with Dr. Anthonette Legato and Dr. Candiss Norse. The patient's husband told me that  she has a murmur, and she is supposed to see the cardiologist very soon.   ALLERGIES:  SULFA.   SOCIAL HISTORY:  Smokes about half pack per day, continues to smoke, smoked for over 30 years. No alcohol. No drugs. Lives with husband.   FAMILY HISTORY:  Mother and father are deceased. Father had died from cancer of unknown type. Mother died of heart disease.    REVIEW OF SYSTEMS:  Right now, the patient is sleeping and not able to tell me about review of systems.   PHYSICAL EXAMINATION:  VITAL SIGNS:  Temperature 98.6, heart rate 71, blood pressure 126/66, saturation 91% on room air.  GENERAL:  She is a 59 year old female patient seen in the ER, resting comfortably.  HEENT:  Head normocephalic, atraumatic.  EYES:  Pupils are equal, round, reactive to light. No conjunctival pallor. No scleral icterus.  NOSE:  No nasal lesions. No drainage.  MOUTH:  No lesions. no oro NECK:  Supple. No JVD. No carotid bruit. Normal range of motion.  RESPIRATORY:  Clear to auscultation. No wheeze. No rales.  CARDIOVASCULAR:  The patient has S1, S2 regular. The patient has ejection systolic murmur at aortic area, has pulses equal at carotid, femoral, and dorsalis pedis. No peripheral edema.  ABDOMEN:  Soft, nontender, nondistended. Bowel sounds present. No organomegaly.  MUSCULOSKELETAL:  The patient's extremities move x4. No effusion.  SKIN:  Inspection is normal.  NEUROLOGIC:  The patient right now sleeping, but no facial droop and no hypotonia, and the patient has no rigidity.  PSYCHIATRIC:  The patient's mood and affect are within normal limits.   LABORATORY DATA:  Chest x-ray shows stable cardiomegaly with  mild   congestion. with out edema,WBC 8.1, hemoglobin 11.6, hematocrit 36.1, platelets 289. Electrolytes: Sodium 136, potassium 3.8, chloride 102, bicarb 30, BUN 39, creatinine 6.16, glucose 117. BNP 8705. The patient's EKG shows T-wave inversions in V1, V2, but normal sinus rhythm. No other changes.    ASSESSMENT AND PLAN:  1.  The patient is a 59 year old female patient with chest pain and near syncope. The patient's chest pain resolved with nitroglycerin. Symptoms were concerning for acute coronary syndrome. The patient will be given aspirin, beta blockers, and nitroglycerin, and we will admit her to telemetry, continue to cycle cardiac markers 2 more times, and get an echocardiogram and cardiology consult because of her chest pain and near syncope and chest pain treated with nitroglycerin.  2.  End-stage renal disease on hemodialysis. The patient will get nephrology consult and get hemodialysis on Monday, Wednesday, Friday.  3.  Chronic obstructive pulmonary disease with no exacerbation. Continue Advair spray 1 nebulizer.  4.  History of anxiety and depression. Continue her Xanax and Zoloft.  5.  The patient has end-stage renal disease, continue hemodialysis as mentioned.  6.  History of secondary hyperparathyroidism. Continue Sensipar and calcium acetate.  7.  The patient has tobacco abuse. I spoke with husband briefly about smoking cessation, and I could not talk to this patient because she was sleeping.   TIME SPENT ON HISTORY AND PHYSICAL:  55 minutes.    ____________________________ [name], MD sk:ms D: 03/19/2014 21:02:14 ET T: 03/19/2014 22:16:33 ET JOB#: [id number]  cc: [name], MD, <Dictator> [name] MD ELECTRONICALLY SIGNED 04/20/2014 19:32

## 2015-03-19 NOTE — Discharge Summary (Signed)
PATIENT NAME:  Michele Nash, Michele Nash MR#:  [id number] DATE OF BIRTH:  1956/05/20  DATE OF ADMISSION:  03/19/2014 DATE OF DISCHARGE:  03/20/2014  PRIMARY CARE PHYSICIAN: [name], MD   DISCHARGE DIAGNOSES:  1. Chest pain, no acute coronary syndrome.  2. End-stage renal disease.  3. Hypertension.  4. Chronic obstructive pulmonary disease.  5. Tobacco abuse.   CONDITION: Stable.   CODE STATUS: FULL CODE.   HOME MEDICATIONS: Please refer to the medication reconciliation list.   DIET: Low sodium and renal diet.   ACTIVITY: As tolerated.   FOLLOWUP CARE: Follow up with PCP within 1 to 2 weeks. Follow up with Dr. Juleen China within 1 to 2 weeks. Also follow up with Dr. Ubaldo Glassing as outpatient.  REASON FOR ADMISSION: Chest pain.   HOSPITAL COURSE: The patient is a 59 year old female with a history of ESRD on dialysis, developed chest pain, 2 hours after hemodialysis. The patient was sent to ED, was given nitroglycerin and aspirin. The patient did not have chest pain anymore after receiving nitroglycerin. For detailed history and physical examination, please refer to the admission note dictated by Dr. Vianne Bulls. On the admission date, the patient's WBC 8.1, hemoglobin 11.6, BUN 39, creatinine 6.16. BNP 8705. EKG showed T-wave inversion in leads V1, V2, but normal sinus rhythm, no other changes. After admission, the patient has been treated with aspirin, beta blocker, nitroglycerin. Troponin level has been negative for 3 times. Dr. Ubaldo Glassing evaluated the patient and suggested that the patient may be discharged home and follow up as outpatient for further workup.   For ESRD, the patient we will get dialysis today, and then discharge home.   COPD is stable.   The patient has no complaints of any chest pain, shortness of breath. Her vital signs are stable. She is clinically stable. Will be discharged to home today. Discussed the patient's discharge plan with the patient's nurse, Dr. Ubaldo Glassing and Dr. Juleen China.    TIME SPENT: About 35 minutes.   ____________________________ Demetrios Loll, MD qc:dr D: 03/20/2014 16:05:28 ET T: 03/21/2014 03:32:55 ET JOB#: [id number]  cc: Demetrios Loll, MD, <Dictator> Demetrios Loll MD ELECTRONICALLY SIGNED 03/21/2014 17:00

## 2015-03-19 NOTE — Consult Note (Signed)
Brief Consult Note: Diagnosis: chest pian while on dialysis. ruled out for mi.   Patient was seen by consultant.   Comments: 59 yo female with no prior cardiac history who was admitted after complaining of severe chest pian while on HD yesterday. She presented and has ruled out for an mi. EKG unremarkable. No further chest pian. Would ambulated today on current meds and consider discharge with outpatient follow up for funcitonal study.  Electronic Signatures: Teodoro Spray (MD)  (Signed 25-Apr-15 10:56)  Authored: Brief Consult Note   Last Updated: 25-Apr-15 10:56 by Teodoro Spray (MD)

## 2015-03-19 NOTE — Op Note (Signed)
PATIENT NAME:  Michele Nash, Michele Nash MR#:  [id number] DATE OF BIRTH:  Jun 14, 1956  DATE OF PROCEDURE:  03/30/2014  PREOPERATIVE DIAGNOSES: 1.  Complication of arteriovenous fistula, left arm.  2.  End-stage renal disease requiring hemodialysis.   POSTOPERATIVE DIAGNOSES: 1.  Complication of arteriovenous fistula, left arm.  2.  End-stage renal disease requiring hemodialysis.   PROCEDURES PERFORMED: 1.  Contrast injection, left brachiocephalic fistula.  2.  Percutaneous transluminal angioplasty to 8 mm, venous portion, left AV fistula.   SURGEON: Katha Cabal, M.D.    SEDATION:  Versed 4 mg plus fentanyl 150 mcg administered IV. Continuous ECG, pulse oximetry and cardiopulmonary monitoring is performed throughout the entire procedure by the interventional radiology nurse. Total sedation time was 50 minutes.   ACCESS: A 6-French sheath, left brachiocephalic fistula, antegrade direction.   CONTRAST USED:  25 mL.   FLUOROSCOPY TIME:  0.6 minutes.   INDICATIONS:  [name] is a 59 year old woman currently maintained on hemodialysis, who is therefore undergoing evaluation of her fistula, as they have had increasing difficulty with cannulation and problems achieving adequate dialysis. Risks and benefits were reviewed. All questions answered. The patient agrees to proceed.   DESCRIPTION OF PROCEDURE:  The patient is taken to special procedures and placed in the supine position. After adequate sedation is achieved, she is positioned supine with her left arm extended, palm upward. The left arm is prepped and draped in a sterile fashion. Appropriate timeout is called.   Lidocaine is infiltrated in the soft tissues. Overlying the fistula, just above the arterial anastomosis, an access to the fistula is obtained with a micropuncture needle in an antegrade direction; microwire followed by microsheath, J-wire followed by a 6-French sheath. Hand injection of contrast is then utilized to demonstrate the  fistula and the central veins, as well as the arterial portion. After review of the images, 3000 units of heparin is given. A Magic Torque wire is advanced and the midportion of the fistula, throughout the venous cannulation zone, is treated with an 8 mm x 8 cm Dorado balloon; inflation is to almost 20 atmospheres for 2 full minutes. Followup imaging demonstrates resolution of the narrowings with rapid flow of contrast and improvement in the palpable thrill.   When the balloon is inflated, there is reflux of contrast into the arterial portion which is widely patent.   Pursestring suture is placed after the balloon and wire is removed and light pressure is held. There are no immediate complications.   INTERPRETATION:  Initial images demonstrate high-grade stenoses within the midportion of the fistula in the venous cannulation zone. The cephalic confluence is widely patent, as are the central veins. The arterial portion is widely patent.   Following angioplasty, there is resolution of these lesions with improvement in flow throughout the fistula.   SUMMARY:  Successful salvage of left arm fistula as described above.      ____________________________ Katha Cabal, MD ggs:dmm D: 03/31/2014 10:01:28 ET T: 03/31/2014 12:38:18 ET JOB#: [id number]  cc: Katha Cabal, MD, <Dictator> Katha Cabal MD ELECTRONICALLY SIGNED 04/27/2014 12:26

## 2015-03-27 NOTE — H&P (Signed)
PATIENT NAME:  Michele Nash, TENNYSON MR#:  [id number] DATE OF BIRTH:  [birth year]  DATE OF ADMISSION:  01/06/2015  PRIMARY DOCTOR:  Dr. Rosario Jacks.   EMERGENCY ROOM PHYSICIAN: Dr.Shaevitz.   CHIEF COMPLAINT: Shortness of breath.   HISTORY OF PRESENT ILLNESS: This is a 59 year old female patient with a history of ESRD, on hemodialysis Monday, Wednesday and Friday, who comes in because of chest pain and shortness of breath. The patient is having chest pain in the middle of the chest, not radiating to the shoulders or the back for about a week. The patient has no associated nausea, vomiting or sweating. She has had shortness of breath for about a week associated with some cough but no phlegm. The patient's shortness of breath got worse today so she came to the ER. In the ER, she was found to have an elevated blood pressure of 213/102, and the patient still has a round of chest pain. Because of that, we were asked to admit the patient. The patient received 4 mg of morphine, and she said it helped her. She still has high blood pressure, and the blood pressure is still 203/192. The patient's last dialysis was yesterday, and she had full hemodialysis yesterday. She did not have any fever. No exertional dyspnea. According to her, she had a stress test a few years ago, and it was normal.   PAST MEDICAL HISTORY: Significant for hypertension, ESRD on hemodialysis, COPD.   ALLERGIES: SHE IS ALLERGIC TO SULFA.   SOCIAL HISTORY: The patient is smoking about a half pack per day. She continues to smoke and has smoked for about 30 years. No alcohol. No drugs. She lives with her husband.   FAMILY HISTORY: Mother and father are deceased. Had heart problems. The patient's brother recently died of a heart attack at the age of 64.   PAST MEDICAL HISTORY: Also includes chronic back pain, anxiety and depression.   MEDICATIONS:  1.  Calcium acetate 667 mg 1 capsule as needed with snacks and 2 capsules with each meal. 2.   Omeprazole 40 mg p.o. daily.  3.  Fluticasone nasal spray twice a day.  4.  Advair Diskus 25/50 one puff b.i.d. 5.  Hydralazine 50 mg p.o. t.i.d. 6.  Lasix 40 mg p.o. daily.  7.  Losartan 100 mg p.o. daily.  8.  Metoprolol tartrate 50 mg p.o. b.i.d.  9.  Oxycodone 5 mg every 6 hours as needed for pain.  10. ProAir 90 mcg 2 puffs every 4 hours.  11. multivitamin 1 tablet daily.  12. Zoloft 40 mg p.o. daily.  13. Zetia 10 mg p.o. daily.   REVIEW OF SYSTEMS:  CONSTITUTIONAL: The patient has no fatigue or fever. No weakness.  HEENT:  Eyes: No blurred vision. No inflammation. No glaucoma. No cataracts. ENT: No tinnitus. No epistaxis. No difficulty swallowing.  RESPIRATORY: The patient has had shortness of breath for about a week and a dry cough. History of COPD present.  CARDIOVASCULAR: Midsternal chest pain for about a week. No orthopnea. No PND. No pedal edema. No palpitations. No syncope.  GASTROINTESTINAL: No nausea. No vomiting. No abdominal pain. No history of constipation or diarrhea.  GENITOURINARY: No dysuria. The patient is on dialysis.  ENDOCRINE: No polyuria or nocturia.  INTEGUMENTARY: No skin rashes.  MUSCULOSKELETAL: Has chronic back pain but no history of gout or arthritis.  NEUROLOGIC: The patient has no numbness or weakness. No dysarthria. No seizures or strokes.  PSYCHIATRIC: Has anxiety and depression.   PHYSICAL EXAMINATION:  VITAL SIGNS: Temperature 97.5, heart rate 75, blood pressure 213/102, saturations 98% on room air.  GENERAL: Alert, awake, oriented, well-developed, well-nourished female, answering questions appropriately, not in distress.  HEENT:  Head:  Normocephalic, atraumatic. Eyes: Pupils equal and reactive to light. No conjunctival pallor. No scleral icterus. Nose: No nasal lesions. No drainage. Ears: No redness or external lesions.  NECK: Supple. No JVD. No carotid bruit. Normal range of motion.  RESPIRATORY: Bilateral breath sounds present. Very faint  expiratory wheeze present in both lung fields.  CARDIOVASCULAR: S1, S2 regular. No murmurs. The patient has no peripheral edema. Has a left arm AV fistula. Femoral and pedal pulses are intact.  GASTROINTESTINAL: The abdomen is soft, nontender, nondistended. Bowel sounds present. No organomegaly. No hernias.  MUSCULOSKELETAL: Normal gait and station. The patient's extremities move x4. No tenderness or effusion. Range of motion is adequate. Strength and tone are equal bilaterally.  LYMPHATIC: No lymphadenopathy.  NEUROLOGIC: Cranial nerves II through XII intact. Power 5/5 in the upper and lower extremities. DTRs 2+ bilaterally. Incisions are intact.  PSYCHIATRIC: Motor and affect are within normal limits.   LABORATORY DATA: Electrolytes: Sodium is 143, potassium 4.3, chloride 102, bicarbonate 35, BUN 29, creatinine 5.6, serum glucose 86. CBC: WBC 6.9, hemoglobin 9.9, hematocrit 31.4, platelets 297,000. Troponin 0.04.   Chest x-ray: No edema or consolidation.   The patient's CT chest showed no pulmonary emboli. The patient has cardiomegaly and coronary atherosclerosis and a 3-mm pulmonary nodule in the left upper lobe.   EKG: Normal sinus rhythm at 75 beats per minute. No ST-T changes.   ASSESSMENT AND PLAN:  1.  This is a 59 year old female patient with shortness of breath and hypertensive urgency, causing shortness of breath. The patient is admitted to observation status on telemetry. Continue her home medications for blood pressure, namely hydralazine 50 mg t.i.d., losartan,lasix, metoprolol, and we will use IV hydralazine 20 mg q.4 hours for  bp more than 160/90.  2.  Chest pain, likely due to uncontrolled hypertension. Troponins have been negative. EKG was unremarkable. Continue to cycle 2 more sets of troponins and obtain Lexiscan stress test tomorrow. Continue her on aspirin, beta blockers and statins.  3.  History of chronic obstructive pulmonary disease with faint wheezing. Continue her home  medications, Advair and also ProAir.  4.  End-stage renal disease, on hemodialysis. Consult nephrology. She is due for HD tomorrow.    TIME SPENT: About 55 minutes.    ____________________________ [name], MD sk:JT D: 01/06/2015 14:27:46 ET T: 01/06/2015 14:43:02 ET JOB#: [id number]  cc: [name], MD, <Dictator> Isla Pence, MD  [name] MD ELECTRONICALLY SIGNED 01/11/2015 21:20

## 2015-03-27 NOTE — Discharge Summary (Signed)
PATIENT NAME:  Michele Nash, Michele Nash MR#:  [id number] DATE OF BIRTH:  07-Oct-1956  DATE OF ADMISSION:  01/06/2015 DATE OF DISCHARGE:  01/07/2015  CHIEF COMPLAINT: Chest pain, shortness of breath.   DISCHARGE DIAGNOSES: 1. Chest pain, appears atypical, resolved.  2. End-stage renal disease on hemodialysis.  3. Malignant hypertension.  4. Chronic obstructive pulmonary disease.  5. Myoview stress test within normal limits. No evidence of ischemia.  6. Incidental note of 3 mm nodule in the left upper lobe. Follow up as outpatient.   CODE STATUS: Full code.   MEDICATIONS: 1. Lasix 40 mg daily on nondialysis days.  2. Losartan 100 mg daily.  3. Zetia 10 mg daily.  4. Sertraline 50 mg daily.  5. Fluticasone 1 puffs b.i.d.  6. Metoprolol 50 mg b.i.d.  7. Lipitor 10 mg at bedtime.  8. Oxycodone 5 mg 1 tablet every 6 hours as needed.  9. Benadryl 25 mg daily as needed.  10. Hydralazine 50 mg t.i.d.  11. ProAir HFA 2 puffs hourly as needed.  12. Rena-Vite with vitamin B complex and C once a day along with folic acid once a day.  13. Emla 2.5/2.5 topical cream a.m. as needed on dialysis days.  14. Nexium 40 mg daily.  15. Fluticasone nasal spray 50 mcg 1 spray b.i.d.  16. Calcium acetate 667 mg 2 capsules 3 times a day with meals.  17. Amlodipine 5 mg daily.  18. Aspirin 81 mg daily.  DISCHARGE DIET:  Renal diet.   FOLLOWUP INSTRUCTIONS:  1. Follow up with your primary care physician, Dr. Rosario Jacks.  2. Resume hemodialysis as before.   LABORATORY DATA: Cardiac enzymes x3 negative. Potassium is 4.3. CT chest: No evidence of PE. Cardiomegaly with coronary atherosclerosis. A 3 mm pulmonary nodule in the left upper lobe.   HOSPITAL COURSE:  1. Ms. Bones is a 59 year old African American female with past medical history of end-stage renal disease on hemodialysis, hypertension, comes in with chest pain and shortness of breath. She was admitted on the medical floor. Cardiac enzymes remained negative x  3. She was started on aspirin, beta blockers, and nitroglycerin. Cardiac stress was done and essentially was negative without any wall motion abnormality. The patient did not have further chest pain.  2. End-stage renal disease: Hemodialysis was continued.  3. Chronic obstructive pulmonary disease with no exacerbation: Continued inhalers. The patient has quit smoking for 2-1/2 weeks.  4. History of anxiety, depression. Continue Xanax, Zoloft.  5. A 3 mm incidental pulmonary nodule, left upper lobe: We will have her follow with primary care physician and follow-up CT in the next 6-8 months. Hospital stay, otherwise remained stable.   CODE STATUS: The patient remained a full code.   TIME SPENT: 40 minutes.   ____________________________ Hart Rochester Posey Pronto, MD sap:mw D: 01/08/2015 07:31:04 ET T: 01/08/2015 11:41:44 ET JOB#: [id number]  cc: Anita Mcadory A. Posey Pronto, MD, <Dictator> Isla Pence, MD Ilda Basset MD ELECTRONICALLY SIGNED 01/25/2015 17:29

## 2015-03-29 ENCOUNTER — Emergency Department: Payer: Medicare Other

## 2015-03-29 ENCOUNTER — Encounter: Payer: Self-pay | Admitting: Emergency Medicine

## 2015-03-29 ENCOUNTER — Observation Stay
Admission: EM | Admit: 2015-03-29 | Discharge: 2015-03-31 | Disposition: A | Discharge status: Home / Self Care | Payer: Medicare Other | Attending: Internal Medicine | Admitting: Internal Medicine

## 2015-03-29 DIAGNOSIS — Z79899 Other long term (current) drug therapy: Secondary | ICD-10-CM | POA: Diagnosis not present

## 2015-03-29 DIAGNOSIS — Z882 Allergy status to sulfonamides status: Secondary | ICD-10-CM | POA: Diagnosis not present

## 2015-03-29 DIAGNOSIS — Z992 Dependence on renal dialysis: Secondary | ICD-10-CM | POA: Insufficient documentation

## 2015-03-29 DIAGNOSIS — H6092 Unspecified otitis externa, left ear: Secondary | ICD-10-CM | POA: Insufficient documentation

## 2015-03-29 DIAGNOSIS — R0602 Shortness of breath: Secondary | ICD-10-CM | POA: Diagnosis present

## 2015-03-29 DIAGNOSIS — F17200 Nicotine dependence, unspecified, uncomplicated: Secondary | ICD-10-CM | POA: Insufficient documentation

## 2015-03-29 DIAGNOSIS — E785 Hyperlipidemia, unspecified: Secondary | ICD-10-CM | POA: Diagnosis not present

## 2015-03-29 DIAGNOSIS — F418 Other specified anxiety disorders: Secondary | ICD-10-CM | POA: Insufficient documentation

## 2015-03-29 DIAGNOSIS — I120 Hypertensive chronic kidney disease with stage 5 chronic kidney disease or end stage renal disease: Principal | ICD-10-CM | POA: Insufficient documentation

## 2015-03-29 DIAGNOSIS — I10 Essential (primary) hypertension: Secondary | ICD-10-CM | POA: Diagnosis present

## 2015-03-29 DIAGNOSIS — Z809 Family history of malignant neoplasm, unspecified: Secondary | ICD-10-CM | POA: Insufficient documentation

## 2015-03-29 DIAGNOSIS — Z7982 Long term (current) use of aspirin: Secondary | ICD-10-CM | POA: Diagnosis not present

## 2015-03-29 DIAGNOSIS — N186 End stage renal disease: Secondary | ICD-10-CM | POA: Diagnosis not present

## 2015-03-29 DIAGNOSIS — R739 Hyperglycemia, unspecified: Secondary | ICD-10-CM | POA: Diagnosis not present

## 2015-03-29 DIAGNOSIS — F411 Generalized anxiety disorder: Secondary | ICD-10-CM | POA: Diagnosis not present

## 2015-03-29 DIAGNOSIS — J449 Chronic obstructive pulmonary disease, unspecified: Secondary | ICD-10-CM | POA: Diagnosis not present

## 2015-03-29 DIAGNOSIS — Z8249 Family history of ischemic heart disease and other diseases of the circulatory system: Secondary | ICD-10-CM | POA: Insufficient documentation

## 2015-03-29 DIAGNOSIS — Z90710 Acquired absence of both cervix and uterus: Secondary | ICD-10-CM | POA: Insufficient documentation

## 2015-03-29 LAB — CBC
HEMATOCRIT: 35.3 % (ref 35.0–47.0)
HEMOGLOBIN: 11.6 g/dL — AB (ref 12.0–16.0)
MCH: 32 pg (ref 26.0–34.0)
MCHC: 32.7 g/dL (ref 32.0–36.0)
MCV: 97.7 fL (ref 80.0–100.0)
PLATELETS: 342 10*3/uL (ref 150–440)
RBC: 3.61 MIL/uL — ABNORMAL LOW (ref 3.80–5.20)
RDW: 14.3 % (ref 11.5–14.5)
WBC: 10.1 10*3/uL (ref 3.6–11.0)

## 2015-03-29 LAB — BASIC METABOLIC PANEL
Anion gap: 17 — ABNORMAL HIGH (ref 5–15)
BUN: 62 mg/dL — AB (ref 6–20)
CALCIUM: 8.6 mg/dL — AB (ref 8.9–10.3)
CO2: 30 mmol/L (ref 22–32)
Chloride: 95 mmol/L — ABNORMAL LOW (ref 101–111)
Creatinine, Ser: 12.05 mg/dL — ABNORMAL HIGH (ref 0.44–1.00)
GFR calc Af Amer: 3 mL/min — ABNORMAL LOW (ref >60)
GFR, EST NON AFRICAN AMERICAN: 3 mL/min — AB (ref >60)
Glucose, Bld: 91 mg/dL (ref 65–99)
Potassium: 5.2 mmol/L — ABNORMAL HIGH (ref 3.5–5.1)
Sodium: 142 mmol/L (ref 135–145)

## 2015-03-29 LAB — TROPONIN I: TROPONIN I: 0.08 ng/mL — AB (ref <0.031)

## 2015-03-29 MED ORDER — FUROSEMIDE 10 MG/ML IJ SOLN
INTRAMUSCULAR | Status: AC
  Administered 2015-03-29: 40 mg via INTRAVENOUS
  Filled 2015-03-29: qty 4

## 2015-03-29 MED ORDER — IPRATROPIUM-ALBUTEROL 0.5-2.5 (3) MG/3ML IN SOLN
RESPIRATORY_TRACT | Status: AC
  Administered 2015-03-29: 3 mL via RESPIRATORY_TRACT
  Filled 2015-03-29: qty 6

## 2015-03-29 MED ORDER — IPRATROPIUM-ALBUTEROL 0.5-2.5 (3) MG/3ML IN SOLN
3.0000 mL | Freq: Once | RESPIRATORY_TRACT | Status: AC
  Administered 2015-03-29: 3 mL via RESPIRATORY_TRACT

## 2015-03-29 MED ORDER — FUROSEMIDE 10 MG/ML IJ SOLN
40.0000 mg | Freq: Once | INTRAMUSCULAR | Status: AC
Start: 2015-03-29 — End: 2015-03-29
  Administered 2015-03-29: 40 mg via INTRAVENOUS

## 2015-03-29 MED ORDER — IPRATROPIUM-ALBUTEROL 0.5-2.5 (3) MG/3ML IN SOLN
RESPIRATORY_TRACT | Status: AC
Start: 2015-03-29 — End: 2015-03-29
  Administered 2015-03-29: 3 mL via RESPIRATORY_TRACT
  Filled 2015-03-29: qty 3

## 2015-03-29 NOTE — ED Notes (Signed)
Pt reports SOB is doing better however MD order more meds to be given. Pt noted to have all over body tremors pt cant sit still she reports its been that way since she started dialysis. Denies any pain at this time.

## 2015-03-29 NOTE — ED Notes (Signed)
Trop 0.08 called to [employer] charge Nurse

## 2015-03-29 NOTE — ED Provider Notes (Signed)
[facility] Emergency Department Provider Note    ____________________________________________  Time seen: 1945  I have reviewed the triage vital signs and the nursing notes.   HISTORY  Chief Complaint Shortness of Breath      HPI Michele Nash is a 59 y.o. female who presents to the emergency department today because of complaints of shortness of breath for 3 days. The patient states that she missed her course of dialysis yesterday for "personal reasons". She states that she has also had same cough. Shortness breath. Be worse with exertion. She states she has had similar feelings in the past when she has been fluid overloaded.  In addition patient has additional complaints of left ear pain, sore throat, right eye pain.  Patient denies any fevers.   Past Medical History  Diagnosis Date  . Chronic kidney disease   . COPD (chronic obstructive pulmonary disease)   . Hypertension   . Hyperlipidemia   . Depression   . Dialysis patient 2014    There are no active problems to display for this patient.   Past Surgical History  Procedure Laterality Date  . Abdominal hysterectomy    . Dialysis fistula creation    . Colonoscopy  2011    Current Outpatient Rx  Name  Route  Sig  Dispense  Refill  . albuterol (PROVENTIL HFA;VENTOLIN HFA) 108 (90 BASE) MCG/ACT inhaler   Inhalation   Inhale 2 puffs into the lungs every 6 (six) hours as needed for wheezing or shortness of breath.         . ALPRAZolam (XANAX) 0.5 MG tablet   Oral   Take 0.5 mg by mouth 2 (two) times daily.         Marland Kitchen amLODipine (NORVASC) 5 MG tablet   Oral   Take 5 mg by mouth daily.         Marland Kitchen amoxicillin-clavulanate (AUGMENTIN) 500-125 MG per tablet   Oral   Take 1 tablet by mouth 2 (two) times daily.         . calcium carbonate (OS-CAL) 600 MG TABS tablet   Oral   Take 600 mg by mouth 2 (two) times daily with a meal.         . carvedilol (COREG) 12.5 MG tablet    Oral   Take 12.5 mg by mouth 2 (two) times daily with a meal.         . cinacalcet (SENSIPAR) 30 MG tablet   Oral   Take 30 mg by mouth daily.         Marland Kitchen ezetimibe (ZETIA) 10 MG tablet   Oral   Take 10 mg by mouth daily.         . fluticasone (VERAMYST) 27.5 MCG/SPRAY nasal spray   Nasal   Place 2 sprays into the nose daily.         . Fluticasone-Salmeterol (ADVAIR DISKUS IN)   Inhalation   Inhale into the lungs.         . furosemide (LASIX) 40 MG tablet   Oral   Take 40 mg by mouth.         Marland Kitchen HYDROcodone-acetaminophen (NORCO/VICODIN) 5-325 MG per tablet   Oral   Take 1 tablet by mouth every 6 (six) hours as needed for moderate pain.         Marland Kitchen losartan (COZAAR) 100 MG tablet   Oral   Take 100 mg by mouth daily.         Marland Kitchen  omeprazole (PRILOSEC) 20 MG capsule   Oral   Take 20 mg by mouth daily.         . sertraline (ZOLOFT) 50 MG tablet   Oral   Take 50 mg by mouth daily.           Allergies Sulfa antibiotics  Family History  Problem Relation Age of Onset  . Heart disease Mother   . Cancer Father   . Cancer Sister     Social History History  Substance Use Topics  . Smoking status: Current Every Day Smoker -- 0.50 packs/day  . Smokeless tobacco: Never Used  . Alcohol Use: No    Review of Systems  Constitutional: Negative for fever. Eyes: Right eye redness ENT: Positive for sore throat. Left ear pain Cardiovascular: Negative for chest pain. Respiratory: Positive for shortness of breath. Gastrointestinal: Negative for abdominal pain, vomiting and diarrhea. Genitourinary: Negative for dysuria. Musculoskeletal: Negative for back pain. Skin: Negative for rash. Neurological: Negative for headaches, focal weakness or numbness.   10-point ROS otherwise negative.  ____________________________________________   PHYSICAL EXAM:  VITAL SIGNS: ED Triage Vitals  Enc Vitals Group     BP 03/29/15 1745 238/114 mmHg     Pulse Rate  03/29/15 1745 85     Resp 03/29/15 1745 26     Temp 03/29/15 1745 98.6 F (37 C)     Temp src --      SpO2 03/29/15 1745 88 %     Weight 03/29/15 1745 246 lb (111.585 kg)     Height 03/29/15 1745 5\' 5"  (1.651 m)     Head Cir --      Peak Flow --      Pain Score 03/29/15 1746 10     Pain Loc --      Pain Edu? --      Excl. in [place]? --     Constitutional: Alert and oriented. Well appearing and in no distress. Eyes: Right eye with mild redness to the conjunctiva laterally. No discharge. PERRL. Normal extraocular movements. ENT   Head: Normocephalic and atraumatic. Bilateral tympanic membrane without erythema, bulging, obvious fluid.   Nose: No congestion/rhinnorhea.   Mouth/Throat: Mucous membranes are moist. No pharyngeal erythema or exudates.   Neck: No stridor. Hematological/Lymphatic/Immunilogical: No cervical lymphadenopathy. Cardiovascular: Normal rate, regular rhythm. Normal and symmetric distal pulses are present in all extremities. No murmurs, rubs, or gallops. Respiratory: Mildly increased respiratory effort without tachypnea nor retractions. Breath sounds are clear and equal bilaterally. No wheezes/rales/rhonchi. Gastrointestinal: Soft and nontender. No distention. No abdominal bruits. There is no CVA tenderness. Genitourinary: Deferred Musculoskeletal: Nontender with normal range of motion in all extremities.  Neurologic:  Normal speech and language. No gross focal neurologic deficits are appreciated. Speech is normal. No gait instability. Skin:  Skin is warm, dry and intact. No rash noted. Psychiatric: Mood and affect are normal. Speech and behavior are normal. Patient exhibits appropriate insight and judgment.  ____________________________________________    LABS (pertinent positives/negatives)  Labs Reviewed  TROPONIN I - Abnormal; Notable for the following:    Troponin I 0.08 (*)    All other components within normal limits  BASIC METABOLIC PANEL -  Abnormal; Notable for the following:    Potassium 5.2 (*)    Chloride 95 (*)    BUN 62 (*)    Creatinine, Ser 12.05 (*)    Calcium 8.6 (*)    GFR calc non Af Amer 3 (*)    GFR calc Af Wyvonnia Lora  3 (*)    Anion gap 17 (*)    All other components within normal limits  CBC - Abnormal; Notable for the following:    RBC 3.61 (*)    Hemoglobin 11.6 (*)    All other components within normal limits     ____________________________________________   EKG  EKG Time: 1808 Rate: 83 Rhythm: normal sinus rhythm Axis: normal Intervals: QTC 472 QRS: Q waves in V3 ST changes: no ST elevation    ____________________________________________    RADIOLOGY   IMPRESSION: Mild cardiac enlargement. No edema or consolidation. ____________________________________________   PROCEDURES  Procedure(s) performed: None  Critical Care performed: No  ____________________________________________   INITIAL IMPRESSION / ASSESSMENT AND PLAN / ED COURSE  Pertinent labs & imaging results that were available during my care of the patient were reviewed by me and considered in my medical decision making (see chart for details).  Patient here with shortness of breath and additional various ENT complaints. I do have some concerns for either COPD or fluid overload given missed dialysis. Patient states she felt somewhat better after first DuoNeb treatment. Will give to further DuoNeb treatments. Additionally patient states she does make some urine so will give dose of Lasix.  ----------------------------------------- 10:32 PM on 03/29/2015 -----------------------------------------  Patient still having sats in the mid to high 80s on room air after treatment. Will plan on admission for supplemental oxygen and dialysis in the morning.   ____________________________________________   FINAL CLINICAL IMPRESSION(S) / ED DIAGNOSES  Final diagnoses:  Shortness of breath     Nance Pear,  MD 03/29/15 2327

## 2015-03-29 NOTE — ED Notes (Signed)
Pt SOB better after svn tx. Pt still having high bp and low o2 sats will continue to monitor.

## 2015-03-29 NOTE — ED Notes (Signed)
Admitting MD at bedside.

## 2015-03-29 NOTE — ED Notes (Addendum)
Reports sob and sore throat onset 2 hours ago.  Pt last had dialysis on Friday, missed it yesterday.

## 2015-03-29 NOTE — H&P (Signed)
[facility] Hospitalists History and Physical  Michele Nash YQI:[id number] DOB: February 25, 1956 DOA: 03/29/2015  Referring physician: Dr. Archie Balboa PCP: [facility] MD   Chief Complaint: Shortness of breath.  HPI: Michele Nash is a 59 y.o. female with below past medical history presents to the emergency room with the complaints of shortness of breath which started this morning, with gradual worsening, hence came to the emergency room. In the emergency room on arrival patient was noted to be with acute respiratory distress with hypoxia by the ED physician, was noted to have wheezing and elevated blood pressure. Patient was placed on oxygen supplementation, given IV Lasix 40 mg, DuoNeb 3 given, following which her shortness of breath and respiratory status stabilized. Patient is currently on oxygen supplementation per nasal cannula and is comfortably resting. He states that she is on hemodialysis 3 times a week that is Monday Wednesday and Friday and she missed her hemodialysis yesterday morning. Last hemodialysis was on Friday, 03/26/2015. Denies any chest pain, palpitations, or dizziness or loss of consciousness. Denies cough, fever, nausea, vomiting or diarrhea. Denies any abdominal pain, denies this dysuria.  Past Medical History  Diagnosis Date  . Chronic kidney disease   . COPD (chronic obstructive pulmonary disease)   . Hypertension   . Hyperlipidemia   . Depression   . Dialysis patient 2014    Past Surgical History  Procedure Laterality Date  . Abdominal hysterectomy    . Dialysis fistula creation    . Colonoscopy  2011    Family History  Problem Relation Age of Onset  . Heart disease Mother   . Cancer Father   . Cancer Sister     Social History  reports that she has been smoking.  She has never used smokeless tobacco. She reports that she does not drink alcohol or use illicit drugs.  Allergies  Allergen Reactions  . Sulfa Antibiotics Swelling    Prior to Admission  medications   Medication Sig Start Date End Date Taking? Authorizing Provider  albuterol (PROVENTIL HFA;VENTOLIN HFA) 108 (90 BASE) MCG/ACT inhaler Inhale 2 puffs into the lungs every 6 (six) hours as needed for wheezing or shortness of breath.   Yes Historical Provider, MD  ALPRAZolam Duanne Moron) 0.5 MG tablet Take 0.5 mg by mouth 2 (two) times daily.   Yes Historical Provider, MD  calcium carbonate (OS-CAL) 600 MG TABS tablet Take 600 mg by mouth 2 (two) times daily with a meal.   Yes Historical Provider, MD  carvedilol (COREG) 12.5 MG tablet Take 12.5 mg by mouth 2 (two) times daily with a meal.   Yes Historical Provider, MD  cinacalcet (SENSIPAR) 30 MG tablet Take 30 mg by mouth daily.   Yes Historical Provider, MD  ezetimibe (ZETIA) 10 MG tablet Take 10 mg by mouth daily.   Yes Historical Provider, MD  fluticasone (VERAMYST) 27.5 MCG/SPRAY nasal spray Place 2 sprays into the nose daily.   Yes Historical Provider, MD  Fluticasone-Salmeterol (ADVAIR DISKUS IN) Inhale into the lungs.   Yes Historical Provider, MD  furosemide (LASIX) 40 MG tablet Take 40 mg by mouth.   Yes Historical Provider, MD  losartan (COZAAR) 100 MG tablet Take 100 mg by mouth daily.   Yes Historical Provider, MD  omeprazole (PRILOSEC) 20 MG capsule Take 20 mg by mouth daily.   Yes Historical Provider, MD  sertraline (ZOLOFT) 50 MG tablet Take 50 mg by mouth daily.   Yes Historical Provider, MD  amLODipine (NORVASC) 5 MG tablet Take 5 mg  by mouth daily.    Historical Provider, MD  amoxicillin-clavulanate (AUGMENTIN) 500-125 MG per tablet Take 1 tablet by mouth 2 (two) times daily.    Historical Provider, MD  HYDROcodone-acetaminophen (NORCO/VICODIN) 5-325 MG per tablet Take 1 tablet by mouth every 6 (six) hours as needed for moderate pain.    Historical Provider, MD     Review of Systems:  Constitutional: No fevers, chills, fatigue, weakness.  Eyes: No blurred or double vision, pain, redness, inflammation. ENT: No tinnitus,  ear pain, hearing loss, epistaxis, discharge, redness of oropharynx, difficulty swallowing. Resp: Positive for shortness of breath as noted in history of present illness. No cough, wheeze, hemoptysis, painful respiration. Cardio-vascular: No chest pain, orthopnea, edema, DOE, palpitations, syncope. GI: No nausea, vomiting, diarrhea, abdominal pain, hematemesis, melena, GERD, rectal bleeding, constipation. GU: No dysuria, hematuria, urgency, frequency, incontinence Endocrine: No polyuria, nocturia, heat or cold intolerance, thirst Hematologic/Lymphatic: No anemia, easy bruising bleeding, swollen glands Integumentary: No acne, rash, lesions. Musculoskeletal: No pain in: neck-back-shoulder-knee-hip, arthritis, gout, redness. Arterio- venous fistula on left arm. Neuro: No numbness, weakness, dysarthria, epilepsy, tremor, vertigo, ataxia, dementia, headache, migraine, CVA, TIA, seizure, memory loss Psych: History of depression/anxiety stable on home medications   Physical Exam: Constitutional: Filed Vitals:   03/29/15 2156 03/29/15 2156 03/29/15 2204 03/29/15 2240  BP: 193/153  200/100 212/125  Pulse: 96   65  Temp:      TempSrc:      Resp: 22   22  Height:      Weight:      SpO2: 87% 97%  96%   Wt Readings from Last 3 Encounters:  03/29/15 111.585 kg (246 lb)  01/27/15 66.225 kg (146 lb)  11/12/13 70.308 kg (155 lb)   General:  Well developed, well nourished, in no apparent distress HEENT: PERRL, EOMI, no scleral icterus, no conjunctivitis, no difficulty hearing, no pharyngeal erythema, Mucosa - moist. Neck:  Supple, no masses, non tender, no adenopathy, no JVD, no Carotid bruit, no thyromegaly,FROM Cardiovascular: RRR, no m/r/g, no S3/S4, chest wall nontender, good pedal pulses, good femoral pulses, no LE edema. Respiratory: CTA bilaterally, no wheeze, no rales, occasional ronch+, breath sounds not diminished, breathing not labored, no increased respiratory effort. Abdomen: soft,  nontender, nondistended, no mass, bowel sounds present and normoactive, no hepatosplenomegaly. GU/Gyn: Not examined. Musculoskeletal: 5/5 muscular strength x4 extremities, no cyanosis/clubbing. AV fistula on left arm + Skin: no rash, no lesions, no erythema. Lymphatic: No adenopathy (cervical/axilla/inguinal/supraclavicular) Neurologic: Cranial nerves intact, DTR intact, Sensation intact, Babinski sign R/L, no dysarthria, no aphasia, no dysphagia, no contractures Psychiatric: Alert, Oriented (time, person, place, circumstance), Cooperative, Judgement good, Memory intact, not confused, not agitated, not depressed, cooperative         Labs on Admission:  Basic Metabolic Panel:  Recent Labs Lab 03/29/15 1735  NA 142  K 5.2*  CL 95*  CO2 30  GLUCOSE 91  BUN 62*  CREATININE 12.05*  CALCIUM 8.6*   Liver Function Tests: No results for input(s): AST, ALT, ALKPHOS, BILITOT, PROT, ALBUMIN in the last 168 hours. No results for input(s): LIPASE, AMYLASE in the last 168 hours. No results for input(s): AMMONIA in the last 168 hours. CBC:  Recent Labs Lab 03/29/15 1735  WBC 10.1  HGB 11.6*  HCT 35.3  MCV 97.7  PLT 342   Cardiac Enzymes:  Recent Labs Lab 03/29/15 1735  TROPONINI 0.08*   BNP (last 3 results) No results for input(s): BNP in the last 8760 hours.  ProBNP (last  3 results) No results for input(s): PROBNP in the last 8760 hours.  CBG: No results for input(s): GLUCAP in the last 168 hours.  Radiological Exams on Admission: Dg Chest 2 View  03/29/2015   CLINICAL DATA:  Shortness of breath, progressed since earlier today  EXAM: CHEST  2 VIEW  COMPARISON:  March 01, 2015  FINDINGS: There is no edema or consolidation. Heart is slightly enlarged with pulmonary vascularity within normal limits. There is no appreciable adenopathy. No bone lesions.  IMPRESSION: Mild cardiac enlargement.  No edema or consolidation.   Electronically Signed   By: Lowella Grip III M.D.   On:  03/29/2015 18:37    EKG: Independently reviewed. Normal sinus rhythm with ventricular rate of 83 bpm. LVH + .  Assessment/Plan Principal Problem:   SOB (shortness of breath) Active Problems:   ESRD on hemodialysis   HTN (hypertension)   COPD (chronic obstructive pulmonary disease)   GAD (generalized anxiety disorder)   1. Shortness of breath likely secondary to fluid overload because of missing hemodialysis yesterday. Possible mild COPD exacerbation also contributing to it. Plan: Oxygen supplementation, continue diuretics namely furosemide, continue DuoNeb's. Follow O2 saturations. Scheduled for hemodialysis tomorrow morning.  2. End-stage renal failure on hemodialysis Monday, Wednesday, Friday. Patient missed hemodialysis on Monday. Resume hemodialysis in the morning. Nephrology consult placed.  3. Hypertension, uncontrolled. Continue home medications, hydralazine IV when necessary for additional control. 4. COPD. Likely mild exacerbation, resolved with DuoNeb. Patient stable currently. Continue O2, DuoNeb's, continue home medications. 5. Elevated troponin mild. No chest pain, no ischemic changes on EKG. Likely associated with end-stage renal disease. Plan telemetry monitoring, cycle cardiac enzymes, follow up accordingly. 6. Depression/GAD, stable on home medications. Continue same. 7. Hyperlipidemia, continue home medications. Code Status:  full code.  DVT Prophylaxis: SubQ heparin.  Time spent on admission: 50 minutes.  Juluis Mire [facility] Hospitalists

## 2015-03-30 DIAGNOSIS — I120 Hypertensive chronic kidney disease with stage 5 chronic kidney disease or end stage renal disease: Secondary | ICD-10-CM | POA: Diagnosis not present

## 2015-03-30 LAB — RENAL FUNCTION PANEL
Albumin: 3.9 g/dL (ref 3.5–5.0)
Anion gap: 18 — ABNORMAL HIGH (ref 5–15)
BUN: 70 mg/dL — ABNORMAL HIGH (ref 6–20)
CALCIUM: 9 mg/dL (ref 8.9–10.3)
CO2: 28 mmol/L (ref 22–32)
Chloride: 96 mmol/L — ABNORMAL LOW (ref 101–111)
Creatinine, Ser: 12.71 mg/dL — ABNORMAL HIGH (ref 0.44–1.00)
GFR calc Af Amer: 3 mL/min — ABNORMAL LOW (ref >60)
GFR, EST NON AFRICAN AMERICAN: 3 mL/min — AB (ref >60)
GLUCOSE: 225 mg/dL — AB (ref 65–99)
PHOSPHORUS: 7 mg/dL — AB (ref 2.5–4.6)
Potassium: 4.6 mmol/L (ref 3.5–5.1)
SODIUM: 142 mmol/L (ref 135–145)

## 2015-03-30 LAB — CBC
HCT: 34 % — ABNORMAL LOW (ref 35.0–47.0)
HCT: 35.2 % (ref 35.0–47.0)
HEMOGLOBIN: 11.2 g/dL — AB (ref 12.0–16.0)
Hemoglobin: 11.4 g/dL — ABNORMAL LOW (ref 12.0–16.0)
MCH: 32.5 pg (ref 26.0–34.0)
MCH: 31.3 pg (ref 26.0–34.0)
MCHC: 33.4 g/dL (ref 32.0–36.0)
MCHC: 31.9 g/dL — AB (ref 32.0–36.0)
MCV: 97.4 fL (ref 80.0–100.0)
MCV: 98 fL (ref 80.0–100.0)
Platelets: 318 10*3/uL (ref 150–440)
Platelets: 328 10*3/uL (ref 150–440)
RBC: 3.49 MIL/uL — AB (ref 3.80–5.20)
RBC: 3.59 MIL/uL — ABNORMAL LOW (ref 3.80–5.20)
RDW: 14.1 % (ref 11.5–14.5)
RDW: 14.4 % (ref 11.5–14.5)
WBC: 7.8 10*3/uL (ref 3.6–11.0)
WBC: 8.5 10*3/uL (ref 3.6–11.0)

## 2015-03-30 LAB — TROPONIN I
TROPONIN I: 0.07 ng/mL — AB (ref <0.031)
Troponin I: 0.08 ng/mL — ABNORMAL HIGH (ref <0.031)
Troponin I: 0.06 ng/mL — ABNORMAL HIGH (ref <0.031)

## 2015-03-30 LAB — BASIC METABOLIC PANEL
Anion gap: 17 — ABNORMAL HIGH (ref 5–15)
BUN: 69 mg/dL — AB (ref 6–20)
CHLORIDE: 95 mmol/L — AB (ref 101–111)
CO2: 27 mmol/L (ref 22–32)
Calcium: 8.8 mg/dL — ABNORMAL LOW (ref 8.9–10.3)
Creatinine, Ser: 12.56 mg/dL — ABNORMAL HIGH (ref 0.44–1.00)
GFR calc Af Amer: 3 mL/min — ABNORMAL LOW (ref >60)
GFR calc non Af Amer: 3 mL/min — ABNORMAL LOW (ref >60)
Glucose, Bld: 218 mg/dL — ABNORMAL HIGH (ref 65–99)
POTASSIUM: 4.8 mmol/L (ref 3.5–5.1)
Sodium: 139 mmol/L (ref 135–145)

## 2015-03-30 LAB — CREATININE, SERUM
Creatinine, Ser: 12.5 mg/dL — ABNORMAL HIGH (ref 0.44–1.00)
GFR calc non Af Amer: 3 mL/min — ABNORMAL LOW (ref >60)
GFR, EST AFRICAN AMERICAN: 3 mL/min — AB (ref >60)

## 2015-03-30 MED ORDER — CALCIUM CARBONATE 600 MG PO TABS: 600.0000 mg | ORAL_TABLET | Freq: Two times a day (BID) | ORAL | Status: DC

## 2015-03-30 MED ORDER — HYDRALAZINE HCL 20 MG/ML IJ SOLN
INTRAMUSCULAR | Status: AC
  Administered 2015-03-30: 10 mg via INTRAVENOUS
  Filled 2015-03-30: qty 1

## 2015-03-30 MED ORDER — FLUTICASONE PROPIONATE 50 MCG/ACT NA SUSP
2.0000 | Freq: Every day | NASAL | Status: DC
  Administered 2015-03-30 – 2015-03-31 (×2): 2 via NASAL
  Filled 2015-03-30: qty 16

## 2015-03-30 MED ORDER — CARVEDILOL 12.5 MG PO TABS
12.5000 mg | ORAL_TABLET | Freq: Two times a day (BID) | ORAL | Status: DC
  Administered 2015-03-30 – 2015-03-31 (×3): 12.5 mg via ORAL
  Filled 2015-03-30 (×3): qty 1

## 2015-03-30 MED ORDER — FUROSEMIDE 40 MG PO TABS
40.0000 mg | ORAL_TABLET | Freq: Every day | ORAL | Status: DC
  Administered 2015-03-30 – 2015-03-31 (×2): 40 mg via ORAL
  Filled 2015-03-30 (×2): qty 1

## 2015-03-30 MED ORDER — EZETIMIBE 10 MG PO TABS
10.0000 mg | ORAL_TABLET | Freq: Every day | ORAL | Status: DC
  Administered 2015-03-30 – 2015-03-31 (×2): 10 mg via ORAL
  Filled 2015-03-30 (×2): qty 1

## 2015-03-30 MED ORDER — IPRATROPIUM BROMIDE 0.02 % IN SOLN: 0.5000 mg | Freq: Four times a day (QID) | RESPIRATORY_TRACT | Status: DC | PRN

## 2015-03-30 MED ORDER — IPRATROPIUM BROMIDE 0.02 % IN SOLN
0.5000 mg | Freq: Four times a day (QID) | RESPIRATORY_TRACT | Status: DC
  Administered 2015-03-30: 0.5 mg via RESPIRATORY_TRACT
  Filled 2015-03-30: qty 2.5

## 2015-03-30 MED ORDER — FLUTICASONE FUROATE 27.5 MCG/SPRAY NA SUSP: 2.0000 | Freq: Every day | NASAL | Status: DC

## 2015-03-30 MED ORDER — HYDRALAZINE HCL 20 MG/ML IJ SOLN
10.0000 mg | INTRAMUSCULAR | Status: DC | PRN
  Administered 2015-03-30 (×3): 10 mg via INTRAVENOUS
  Filled 2015-03-30 (×2): qty 1

## 2015-03-30 MED ORDER — ACETAMINOPHEN 650 MG RE SUPP: 650.0000 mg | Freq: Four times a day (QID) | RECTAL | Status: DC | PRN

## 2015-03-30 MED ORDER — LOSARTAN POTASSIUM 50 MG PO TABS
100.0000 mg | ORAL_TABLET | Freq: Every day | ORAL | Status: DC
  Administered 2015-03-30 – 2015-03-31 (×2): 100 mg via ORAL
  Filled 2015-03-30 (×2): qty 2

## 2015-03-30 MED ORDER — HEPARIN SODIUM (PORCINE) 5000 UNIT/ML IJ SOLN
5000.0000 [IU] | Freq: Three times a day (TID) | INTRAMUSCULAR | Status: DC
  Filled 2015-03-30 (×3): qty 1

## 2015-03-30 MED ORDER — SODIUM CHLORIDE 0.9 % IV SOLN: 100.0000 mL | INTRAVENOUS | Status: DC | PRN

## 2015-03-30 MED ORDER — ALPRAZOLAM 0.5 MG PO TABS
0.5000 mg | ORAL_TABLET | Freq: Two times a day (BID) | ORAL | Status: DC
  Administered 2015-03-30 – 2015-03-31 (×4): 0.5 mg via ORAL
  Filled 2015-03-30 (×4): qty 1

## 2015-03-30 MED ORDER — CINACALCET HCL 30 MG PO TABS
30.0000 mg | ORAL_TABLET | Freq: Every day | ORAL | Status: DC
  Administered 2015-03-30 – 2015-03-31 (×2): 30 mg via ORAL
  Filled 2015-03-30 (×2): qty 1

## 2015-03-30 MED ORDER — MOMETASONE FURO-FORMOTEROL FUM 200-5 MCG/ACT IN AERO
2.0000 | INHALATION_SPRAY | Freq: Two times a day (BID) | RESPIRATORY_TRACT | Status: DC
  Administered 2015-03-30 – 2015-03-31 (×4): 2 via RESPIRATORY_TRACT
  Filled 2015-03-30: qty 8.8

## 2015-03-30 MED ORDER — HYDROCODONE-ACETAMINOPHEN 5-325 MG PO TABS
1.0000 | ORAL_TABLET | Freq: Four times a day (QID) | ORAL | Status: DC | PRN
  Administered 2015-03-31: 1 via ORAL
  Filled 2015-03-30: qty 1

## 2015-03-30 MED ORDER — ALBUTEROL SULFATE (2.5 MG/3ML) 0.083% IN NEBU: 2.5000 mg | INHALATION_SOLUTION | Freq: Four times a day (QID) | RESPIRATORY_TRACT | Status: DC | PRN

## 2015-03-30 MED ORDER — AMLODIPINE BESYLATE 5 MG PO TABS
5.0000 mg | ORAL_TABLET | Freq: Every day | ORAL | Status: DC
  Administered 2015-03-30 – 2015-03-31 (×2): 5 mg via ORAL
  Filled 2015-03-30 (×2): qty 1

## 2015-03-30 MED ORDER — ACETAMINOPHEN 325 MG PO TABS: 650.0000 mg | ORAL_TABLET | Freq: Four times a day (QID) | ORAL | Status: DC | PRN

## 2015-03-30 MED ORDER — SERTRALINE HCL 50 MG PO TABS
50.0000 mg | ORAL_TABLET | Freq: Every day | ORAL | Status: DC
  Administered 2015-03-30 – 2015-03-31 (×2): 50 mg via ORAL
  Filled 2015-03-30 (×2): qty 1

## 2015-03-30 MED ORDER — FLUTICASONE-SALMETEROL 500-50 MCG/DOSE IN AEPB: 1.0000 | INHALATION_SPRAY | Freq: Two times a day (BID) | RESPIRATORY_TRACT | Status: DC

## 2015-03-30 MED ORDER — ASPIRIN EC 81 MG PO TBEC
81.0000 mg | DELAYED_RELEASE_TABLET | Freq: Every day | ORAL | Status: DC
  Administered 2015-03-30 – 2015-03-31 (×2): 81 mg via ORAL
  Filled 2015-03-30 (×2): qty 1

## 2015-03-30 MED ORDER — CALCIUM CARBONATE ANTACID 500 MG PO CHEW
1.0000 | CHEWABLE_TABLET | Freq: Two times a day (BID) | ORAL | Status: DC
  Administered 2015-03-30 – 2015-03-31 (×3): 200 mg via ORAL
  Filled 2015-03-30 (×3): qty 1

## 2015-03-30 MED ORDER — PANTOPRAZOLE SODIUM 40 MG PO TBEC
40.0000 mg | DELAYED_RELEASE_TABLET | Freq: Every day | ORAL | Status: DC
Start: 2015-03-30 — End: 2015-03-31
  Administered 2015-03-30 – 2015-03-31 (×2): 40 mg via ORAL
  Filled 2015-03-30 (×2): qty 1

## 2015-03-30 MED ORDER — ALBUTEROL SULFATE HFA 108 (90 BASE) MCG/ACT IN AERS: 2.0000 | INHALATION_SPRAY | Freq: Four times a day (QID) | RESPIRATORY_TRACT | Status: DC | PRN

## 2015-03-30 NOTE — Care Management (Signed)
ESRD with dialysis at [facility]  Husband transports.  Missed treatment on Monday but does not give reason.

## 2015-03-30 NOTE — Progress Notes (Signed)
[place] [facility] [place] NAME: Michele Nash    MR#:  [id number]  DATE OF BIRTH:  1955-11-30  SUBJECTIVE:  CHIEF COMPLAINT:   Chief Complaint  Patient presents with  . Shortness of Breath    Still short of breath with exertion. No chest pain. Requiring 2L 02.  Has short intense headaches, but not now.  REVIEW OF SYSTEMS:  CONSTITUTIONAL: No fever, fatigue or weakness.  EYES: No blurred or double vision.  EARS, NOSE, AND THROAT: No tinnitus or ear pain.  RESPIRATORY: No cough, shortness of breath, wheezing or hemoptysis.  CARDIOVASCULAR: No chest pain, orthopnea, edema.  GASTROINTESTINAL: No nausea, vomiting, diarrhea or abdominal pain.  GENITOURINARY: No dysuria, hematuria.  ENDOCRINE: No polyuria, nocturia,  HEMATOLOGY: No anemia, easy bruising or bleeding SKIN: No rash or lesion. MUSCULOSKELETAL: No joint pain or arthritis.   NEUROLOGIC: No tingling, numbness, weakness.  PSYCHIATRY: No anxiety or depression.   DRUG ALLERGIES:   Allergies  Allergen Reactions  . Sulfa Antibiotics Swelling    VITALS:  Blood pressure 153/105, pulse 90, temperature 98.3 F (36.8 C), temperature source Oral, resp. rate 22, height 5\' 5"  (1.651 m), weight 67.405 kg (148 lb 9.6 oz), SpO2 100 %.  PHYSICAL EXAMINATION:  GENERAL:  59 y.o.-year-old patient lying in the bed with no acute distress.  EYES: Pupils equal, round, reactive to light and accommodation. No scleral icterus. Extraocular muscles intact.  HEENT: Head atraumatic, normocephalic. Oropharynx and nasopharynx clear.  NECK:  Supple, no jugular venous distention. No thyroid enlargement, no tenderness.  LUNGS: No wheezing, rales, rhonchi or crepitation. No use of accessory.  Bibasilar crackles. She is winded with conversation and requiring 02. CARDIOVASCULAR: S1, S2 normal. No murmurs, rubs, or gallops.  ABDOMEN: Soft, nontender, nondistended. Bowel sounds present. No organomegaly or mass.   EXTREMITIES: No pedal edema, cyanosis, or clubbing.  NEUROLOGIC: Cranial nerves II through XII are intact. Muscle strength 5/5 in all extremities. Sensation intact. Gait not checked.  PSYCHIATRIC: The patient is alert and oriented x 3.  SKIN: No obvious rash, lesion, or ulcer.    LABORATORY PANEL:   CBC  Recent Labs Lab 03/30/15 0940  WBC 8.5  HGB 11.2*  HCT 35.2  PLT 328   ------------------------------------------------------------------------------------------------------------------  Chemistries   Recent Labs Lab 03/29/15 1735 03/30/15 0511  NA 142  --   K 5.2*  --   CL 95*  --   CO2 30  --   GLUCOSE 91  --   BUN 62*  --   CREATININE 12.05* 12.50*  CALCIUM 8.6*  --    ------------------------------------------------------------------------------------------------------------------  Cardiac Enzymes  Recent Labs Lab 03/30/15 0511  TROPONINI 0.08*   ------------------------------------------------------------------------------------------------------------------  RADIOLOGY:  Dg Chest 2 View  03/29/2015   CLINICAL DATA:  Shortness of breath, progressed since earlier today  EXAM: CHEST  2 VIEW  COMPARISON:  March 01, 2015  FINDINGS: There is no edema or consolidation. Heart is slightly enlarged with pulmonary vascularity within normal limits. There is no appreciable adenopathy. No bone lesions.  IMPRESSION: Mild cardiac enlargement.  No edema or consolidation.   Electronically Signed   By: Lowella Grip III M.D.   On: 03/29/2015 18:37    EKG:   Orders placed or performed during the hospital encounter of 03/29/15  . EKG 12-Lead  . EKG 12-Lead    ASSESSMENT AND PLAN:   Principal Problem:   SOB (shortness of breath) Active Problems:   ESRD on hemodialysis  HTN (hypertension)   COPD (chronic obstructive pulmonary disease)   GAD (generalized anxiety disorder)  1. Shortness of breath with hypoxia - fluid overload because of missing hemodialysis -  mild COPD exacerbation also contributing - continue oxygen supplementation, DuoNeb's - hemodialysis again today   2. End-stage renal failure on hemodialysis Monday, Wednesday, Friday.  - HD yesterday and again today  3. Hypertension, uncontrolled.  -  Continue home medications, hydralazine IV prn -  HD today  4. COPD. Likely mild exacerbation - improved with DuoNeb.  - continue O2, DuoNeb's, home medications.  5. Elevated troponin mild.  - not NSTEMI no chest pain, no ischemic changes on EKG, CE's stable  6. Depression/GAD, stable on home medications  7. Hyperlipidemia, continue home medications.   All the records are reviewed and case discussed with Care Management/Social Workerr. Management plans discussed with the patient, family and they are in agreement.  CODE STATUS: FULL  TOTAL TIME TAKING CARE OF THIS PATIENT: 25 minutes.   POSSIBLE D/C IN 1 DAYS, DEPENDING ON CLINICAL CONDITION.   Myrtis Ser M.D on 03/30/2015 at 12:05 PM  Between 7am to 6pm - Pager - [phone]  After 6pm go to [email or web address] - password [email or web address] [place] [facility]  Office  [phone]  CC: Primary care physician; [facility], MD

## 2015-03-30 NOTE — Progress Notes (Signed)
[facility]  ROUNDING NOTE   Subjective:   Missed hemodialysis on Monday due to social reasons. Admitted for malignant hypertension and shortness of breath.  Last hemodialysis on Friday.   Objective:  Vital signs in last 24 hours:  Temp:  [98.1 F (36.7 C)-98.6 F (37 C)] 98.1 F (36.7 C) (05/04 0558) Pulse Rate:  [65-96] 92 (05/04 0057) Resp:  [18-27] 18 (05/04 0650) BP: (174-238)/(98-171) 174/101 mmHg (05/04 0650) SpO2:  [87 %-100 %] 97 % (05/04 0748) FiO2 (%):  [28 %-94 %] 28 % (05/04 0051) Weight:  [67.405 kg (148 lb 9.6 oz)-111.585 kg (246 lb)] 67.405 kg (148 lb 9.6 oz) (05/04 0057)  Weight change:  Filed Weights   03/29/15 1745 03/30/15 0057  Weight: 111.585 kg (246 lb) 67.405 kg (148 lb 9.6 oz)    Intake/Output: I/O last 3 completed shifts: In: -  Out: 200 [Urine:200]   Intake/Output this shift:  Total I/O In: -  Out: 300 [Urine:300]  Physical Exam: General: NAD,   Head: Normocephalic, atraumatic. Moist oral mucosal membranes  Eyes: Anicteric, PERRL  Neck: Supple, trachea midline  Lungs:  Clear to auscultation  Heart: Regular rate and rhythm  Abdomen:  Soft, nontender,   Extremities:  no peripheral edema.  Neurologic: Nonfocal, moving all four extremities  Skin: No lesions  Access: Left arm AVF    Basic Metabolic Panel:  Recent Labs Lab 03/29/15 1735 03/30/15 0511  NA 142  --   K 5.2*  --   CL 95*  --   CO2 30  --   GLUCOSE 91  --   BUN 62*  --   CREATININE 12.05* 12.50*  CALCIUM 8.6*  --     Liver Function Tests: No results for input(s): AST, ALT, ALKPHOS, BILITOT, PROT, ALBUMIN in the last 168 hours. No results for input(s): LIPASE, AMYLASE in the last 168 hours. No results for input(s): AMMONIA in the last 168 hours.  CBC:  Recent Labs Lab 03/29/15 1735 03/30/15 0511  WBC 10.1 7.8  HGB 11.6* 11.4*  HCT 35.3 34.0*  MCV 97.7 97.4  PLT 342 318    Cardiac Enzymes:  Recent Labs Lab 03/29/15 1735 03/30/15 0511   TROPONINI 0.08* 0.08*    BNP: Invalid input(s): POCBNP  CBG: No results for input(s): GLUCAP in the last 168 hours.  Microbiology: No results found for this or any previous visit.  Coagulation Studies: No results for input(s): LABPROT, INR in the last 72 hours.  Urinalysis: No results for input(s): COLORURINE, LABSPEC, PHURINE, GLUCOSEU, HGBUR, BILIRUBINUR, KETONESUR, PROTEINUR, UROBILINOGEN, NITRITE, LEUKOCYTESUR in the last 72 hours.  Invalid input(s): APPERANCEUR    Imaging: Dg Chest 2 View  03/29/2015   CLINICAL DATA:  Shortness of breath, progressed since earlier today  EXAM: CHEST  2 VIEW  COMPARISON:  March 01, 2015  FINDINGS: There is no edema or consolidation. Heart is slightly enlarged with pulmonary vascularity within normal limits. There is no appreciable adenopathy. No bone lesions.  IMPRESSION: Mild cardiac enlargement.  No edema or consolidation.   Electronically Signed   By: Lowella Grip III M.D.   On: 03/29/2015 18:37     Medications:     . ALPRAZolam  0.5 mg Oral BID  . amLODipine  5 mg Oral Daily  . aspirin EC  81 mg Oral Daily  . calcium carbonate  1 tablet Oral BID WC  . carvedilol  12.5 mg Oral BID WC  . cinacalcet  30 mg Oral Daily  .  ezetimibe  10 mg Oral Daily  . fluticasone  2 spray Each Nare Daily  . furosemide  40 mg Oral Daily  . heparin  5,000 Units Subcutaneous 3 times per day  . losartan  100 mg Oral Daily  . mometasone-formoterol  2 puff Inhalation BID  . pantoprazole  40 mg Oral Daily  . sertraline  50 mg Oral Daily   sodium chloride, sodium chloride, acetaminophen **OR** acetaminophen, albuterol, hydrALAZINE, HYDROcodone-acetaminophen, ipratropium  Assessment/ Plan:  59 y.o. black  female with hypertension, hyperlipidemia, diastolic heart failure, hypertension, COPD, tobacco abuse, AOCD, SHPTH, LUE AVF, ESRD 02/2013. Admitted on 03/29/2015   Michele Nash is a 59 y.o. Not Hispanic or Latino female   1. End Stage Renal Disease:  MWF third shift, [place], [facility] Potassium 5.2  Plan for dialysis later today   2. Hypertension: malignant hypertension. Seems to be due to noncompliance and domestic stressors.  Home regimen of carvedilol, amlodipine, losartan, furosemide Hemodialysis for later today.   3. Anemia: Hemoglobin 11.4  No indication for epo.    4. Secondary Hyperparathyroidism: outpatient PTH 354, phos 5.  - Continue cinacalcet  - TUMS as binder   LOS:  Michele Nash 5/4/20169:55 AM

## 2015-03-31 DIAGNOSIS — I120 Hypertensive chronic kidney disease with stage 5 chronic kidney disease or end stage renal disease: Secondary | ICD-10-CM | POA: Diagnosis not present

## 2015-03-31 LAB — HEMOGLOBIN A1C: HEMOGLOBIN A1C: 6.3 % — AB (ref 4.0–6.0)

## 2015-03-31 MED ORDER — CIPROFLOXACIN-DEXAMETHASONE 0.3-0.1 % OT SUSP: 4.0000 [drp] | Freq: Two times a day (BID) | OTIC | Status: DC

## 2015-03-31 MED ORDER — ASPIRIN 81 MG PO TBEC: 81.0000 mg | DELAYED_RELEASE_TABLET | Freq: Every day | ORAL | Status: DC

## 2015-03-31 MED ORDER — CIPROFLOXACIN-DEXAMETHASONE 0.3-0.1 % OT SUSP
4.0000 [drp] | Freq: Two times a day (BID) | OTIC | Status: DC
  Administered 2015-03-31: 4 [drp] via OTIC
  Filled 2015-03-31: qty 7.5

## 2015-03-31 MED ORDER — AMLODIPINE BESYLATE 10 MG PO TABS: 10.0000 mg | ORAL_TABLET | Freq: Every day | ORAL | Status: DC

## 2015-03-31 NOTE — Progress Notes (Signed)
[facility]  ROUNDING NOTE   Subjective:   Hemodialysis yesterday. Tolerated procedure well.  Continues to have increased blood pressures. Now reports headaches from high blood pressure.  Dialysis outpatient flow charts reviewed with fairly well controlled blood pressures.   Objective:  Vital signs in last 24 hours:  Temp:  [97.9 F (36.6 C)-98.5 F (36.9 C)] 98.5 F (36.9 C) (05/05 0422) Pulse Rate:  [82-92] 91 (05/05 0733) Resp:  [20-22] 20 (05/05 0733) BP: (135-196)/(69-105) 196/98 mmHg (05/05 0733) SpO2:  [90 %-100 %] 90 % (05/05 0857) Weight:  [65.409 kg (144 lb 3.2 oz)-68.448 kg (150 lb 14.4 oz)] 68.448 kg (150 lb 14.4 oz) (05/05 0506)  Weight change: -43.785 kg (-96 lb 8.5 oz) Filed Weights   03/30/15 1300 03/30/15 1709 03/31/15 0506  Weight: 67.8 kg (149 lb 7.6 oz) 65.409 kg (144 lb 3.2 oz) 68.448 kg (150 lb 14.4 oz)    Intake/Output: I/O last 3 completed shifts: In: 480 [P.O.:480] Out: 1050 [Urine:1050]   Intake/Output this shift:     Physical Exam: General: NAD,   Head: Normocephalic, atraumatic. Moist oral mucosal membranes  Eyes: Anicteric, PERRL  Neck: Supple, trachea midline  Lungs:  Clear to auscultation  Heart: Regular rate and rhythm  Abdomen:  Soft, nontender,   Extremities:  no peripheral edema.  Neurologic: Nonfocal, moving all four extremities  Skin: No lesions  Access: Left arm AVF    Basic Metabolic Panel:  Recent Labs Lab 03/29/15 1735 03/30/15 0511 03/30/15 0940  NA 142  --  139  142  K 5.2*  --  4.8  4.6  CL 95*  --  95*  96*  CO2 30  --  27  28  GLUCOSE 91  --  218*  225*  BUN 62*  --  69*  70*  CREATININE 12.05* 12.50* 12.56*  12.71*  CALCIUM 8.6*  --  8.8*  9.0  PHOS  --   --  7.0*    Liver Function Tests:  Recent Labs Lab 03/30/15 0940  ALBUMIN 3.9   No results for input(s): LIPASE, AMYLASE in the last 168 hours. No results for input(s): AMMONIA in the last 168 hours.  CBC:  Recent  Labs Lab 03/29/15 1735 03/30/15 0511 03/30/15 0940  WBC 10.1 7.8 8.5  HGB 11.6* 11.4* 11.2*  HCT 35.3 34.0* 35.2  MCV 97.7 97.4 98.0  PLT 342 318 328    Cardiac Enzymes:  Recent Labs Lab 03/29/15 1735 03/30/15 0511 03/30/15 0940 03/30/15 1349  TROPONINI 0.08* 0.08* 0.06* 0.07*    BNP: Invalid input(s): POCBNP  CBG: No results for input(s): GLUCAP in the last 168 hours.  Microbiology: No results found for this or any previous visit.  Coagulation Studies: No results for input(s): LABPROT, INR in the last 72 hours.  Urinalysis: No results for input(s): COLORURINE, LABSPEC, PHURINE, GLUCOSEU, HGBUR, BILIRUBINUR, KETONESUR, PROTEINUR, UROBILINOGEN, NITRITE, LEUKOCYTESUR in the last 72 hours.  Invalid input(s): APPERANCEUR    Imaging: Dg Chest 2 View  03/29/2015   CLINICAL DATA:  Shortness of breath, progressed since earlier today  EXAM: CHEST  2 VIEW  COMPARISON:  March 01, 2015  FINDINGS: There is no edema or consolidation. Heart is slightly enlarged with pulmonary vascularity within normal limits. There is no appreciable adenopathy. No bone lesions.  IMPRESSION: Mild cardiac enlargement.  No edema or consolidation.   Electronically Signed   By: Lowella Grip III M.D.   On: 03/29/2015 18:37     Medications:     .  ALPRAZolam  0.5 mg Oral BID  . amLODipine  5 mg Oral Daily  . aspirin EC  81 mg Oral Daily  . calcium carbonate  1 tablet Oral BID WC  . carvedilol  12.5 mg Oral BID WC  . cinacalcet  30 mg Oral Daily  . ciprofloxacin-dexamethasone  4 drop Left Ear BID  . ezetimibe  10 mg Oral Daily  . fluticasone  2 spray Each Nare Daily  . furosemide  40 mg Oral Daily  . heparin  5,000 Units Subcutaneous 3 times per day  . losartan  100 mg Oral Daily  . mometasone-formoterol  2 puff Inhalation BID  . pantoprazole  40 mg Oral Daily  . sertraline  50 mg Oral Daily   sodium chloride, sodium chloride, acetaminophen **OR** acetaminophen, albuterol, hydrALAZINE,  HYDROcodone-acetaminophen, ipratropium  Assessment/ Plan:  59 y.o. black  female with hypertension, hyperlipidemia, diastolic heart failure, hypertension, COPD, tobacco abuse, AOCD, SHPTH, LUE AVF, ESRD 02/2013. Admitted on 03/29/2015   Michele Nash is a 59 y.o. Not Hispanic or Latino female   1. End Stage Renal Disease: MWF third shift, [place], [place] [facility] yesterday. Tolerated treatment well. No acute indication for dialysis today.  Plan for next dialysis treatment for tomorrow. Continue MWF schedule.  2. Hypertension: malignant hypertension. Seems to be due to noncompliance and domestic stressors.  Home regimen of carvedilol, amlodipine, losartan, furosemide Agree with increased dose of amlodipine and to add hydralazine. Hydralazine was stopped in past due to noncompliance. She tolerated the medication well.   3. Anemia: Hemoglobin 11.2 No indication for epo.   4. Secondary Hyperparathyroidism: outpatient PTH 354, phos 5.  - Continue cinacalcet  - TUMS as binder  LOS: 2 Michele Nash 5/5/20169:23 AM

## 2015-03-31 NOTE — Progress Notes (Signed)
VSS. Room air. Ambulated without oxygen and tolerated it well. NSR. Pt has not complained of any pain. Husband at the bedside. HD done yesterday. Prescriptions given to pt. Pt home meds returned to pt. Discharge instructions given to pt. Pt has no further concerns at this time.

## 2015-03-31 NOTE — Discharge Summary (Signed)
[facility]   PATIENT NAME: Michele Nash    MR#:  [id number]  DATE OF BIRTH:  November 01, 1956  DATE OF ADMISSION:  03/29/2015 ADMITTING PHYSICIAN: Juluis Mire, MD  DATE OF DISCHARGE: 03/31/2015  PRIMARY CARE PHYSICIAN: JADALI,FAYEGH, MD    ADMISSION DIAGNOSIS:  Shortness of breath [R06.02]  DISCHARGE DIAGNOSIS:  Active Problems:   ESRD on hemodialysis   HTN (hypertension)   COPD (chronic obstructive pulmonary disease)   GAD (generalized anxiety disorder)   SECONDARY DIAGNOSIS:   Past Medical History  Diagnosis Date  . Chronic kidney disease   . COPD (chronic obstructive pulmonary disease)   . Hypertension   . Hyperlipidemia   . Depression   . Dialysis patient 2014    HOSPITAL COURSE:    1. Shortness of breath with hypoxia - fluid overload because of missing hemodialysis - mild COPD exacerbation also contributed - treated with oxygen supplementation, DuoNeb's, hemodialysis and improved - now with sats >90% on room air  2. End-stage renal failure on hemodialysis Monday, Wednesday, Friday.  - continue as scheduled  3. Hypertension, uncontrolled.  - BP elevated during admission - increased dose of norvasc from 5 mg daily to 10 mg daily.  - rec getting BP cuff to check at home. Follow at HD center.  4. COPD. Likely mild exacerbation - improved with DuoNeb.  - treated with O2, DuoNeb's, home medications. - no 02 needed at dc  5. Elevated troponin mild.  - not NSTEMI no chest pain, no ischemic changes on EKG, CE's stable  6. Depression/GAD, stable on home medications  7. Hyperlipidemia, continue home medications.  8. Left ear pain: otitis externa on day of discharge. Treat with ciprodex drops.  9. Hyperglycemia: of note her cbg's have been elevated each morning. She is not on steriods. Will check an A1C, this needs to be followed by her PCP.  DISCHARGE CONDITIONS:    Stable Discharge to home with no further Home health needs  CONSULTS OBTAINED:    None  DRUG ALLERGIES:   Allergies  Allergen Reactions  . Sulfa Antibiotics Swelling    DISCHARGE MEDICATIONS:   Current Discharge Medication List    START taking these medications   Details  !! amLODipine (NORVASC) 10 MG tablet Take 1 tablet (10 mg total) by mouth daily. Qty: 30 tablet, Refills: 1    aspirin EC 81 MG EC tablet Take 1 tablet (81 mg total) by mouth daily. Qty: 30 tablet, Refills: 1    ciprofloxacin-dexamethasone (CIPRODEX) otic suspension Place 4 drops into the left ear 2 (two) times daily. Qty: 7.5 mL, Refills: 0     !! - Potential duplicate medications found. Please discuss with provider.    CONTINUE these medications which have NOT CHANGED   Details  albuterol (PROVENTIL HFA;VENTOLIN HFA) 108 (90 BASE) MCG/ACT inhaler Inhale 2 puffs into the lungs every 6 (six) hours as needed for wheezing or shortness of breath.    ALPRAZolam (XANAX) 0.5 MG tablet Take 0.5 mg by mouth 2 (two) times daily.    calcium carbonate (OS-CAL) 600 MG TABS tablet Take 600 mg by mouth 2 (two) times daily with a meal.    carvedilol (COREG) 12.5 MG tablet Take 12.5 mg by mouth 2 (two) times daily with a meal.    cinacalcet (SENSIPAR) 30 MG tablet Take 30 mg by mouth daily.    ezetimibe (ZETIA) 10 MG tablet Take 10 mg by mouth daily.  fluticasone (VERAMYST) 27.5 MCG/SPRAY nasal spray Place 2 sprays into the nose daily.    Fluticasone-Salmeterol (ADVAIR DISKUS IN) Inhale into the lungs.    furosemide (LASIX) 40 MG tablet Take 40 mg by mouth.    losartan (COZAAR) 100 MG tablet Take 100 mg by mouth daily.    omeprazole (PRILOSEC) 20 MG capsule Take 20 mg by mouth daily.    sertraline (ZOLOFT) 50 MG tablet Take 50 mg by mouth daily.    !! amLODipine (NORVASC) 5 MG tablet Take 5 mg by mouth daily.    amoxicillin-clavulanate (AUGMENTIN) 500-125 MG per tablet Take 1 tablet by mouth 2 (two)  times daily.    HYDROcodone-acetaminophen (NORCO/VICODIN) 5-325 MG per tablet Take 1 tablet by mouth every 6 (six) hours as needed for moderate pain.     !! - Potential duplicate medications found. Please discuss with provider.       DISCHARGE INSTRUCTIONS:    If you experience worsening of your admission symptoms, develop shortness of breath, life threatening emergency, suicidal or homicidal thoughts you must seek medical attention immediately by calling 911 or calling your MD immediately  if symptoms less severe.  You Must read complete instructions/literature along with all the possible adverse reactions/side effects for all the Medicines you take and that have been prescribed to you. Take any new Medicines after you have completely understood and accept all the possible adverse reactions/side effects.   Please note  You were cared for by a hospitalist during your hospital stay. If you have any questions about your discharge medications or the care you received while you were in the hospital after you are discharged, you can call the unit and asked to speak with the hospitalist on call if the hospitalist that took care of you is not available. Once you are discharged, your primary care physician will handle any further medical issues. Please note that NO REFILLS for any discharge medications will be authorized once you are discharged, as it is imperative that you return to your primary care physician (or establish a relationship with a primary care physician if you do not have one) for your aftercare needs so that they can reassess your need for medications and monitor your lab values.    Today   CHIEF COMPLAINT:   Chief Complaint  Patient presents with  . Shortness of Breath    HISTORY OF PRESENT ILLNESS:  HPI: Michele Nash is a 59 y.o. female with below past medical history presents to the emergency room with the complaints of shortness of breath which started this morning,  with gradual worsening, hence came to the emergency room. In the emergency room on arrival patient was noted to be with acute respiratory distress with hypoxia by the ED physician, was noted to have wheezing and elevated blood pressure. Patient was placed on oxygen supplementation, given IV Lasix 40 mg, DuoNeb 3 given, following which her shortness of breath and respiratory status stabilized. Patient is currently on oxygen supplementation per nasal cannula and is comfortably resting. He states that she is on hemodialysis 3 times a week that is Monday Wednesday and Friday and she missed her hemodialysis yesterday morning. Last hemodialysis was on Friday, 03/26/2015. Denies any chest pain, palpitations, or dizziness or loss of consciousness. Denies cough, fever, nausea, vomiting or diarrhea. Denies any abdominal pain, denies this dysuria.   VITAL SIGNS:  Blood pressure 196/98, pulse 91, temperature 98.5 F (36.9 C), temperature source Oral, resp. rate 20, height 5\' 5"  (1.651 m),  weight 68.448 kg (150 lb 14.4 oz), SpO2 90 %.  I/O:   Intake/Output Summary (Last 24 hours) at 03/31/15 0959 Last data filed at 03/31/15 0506  Gross per 24 hour  Intake    480 ml  Output    550 ml  Net    -70 ml    PHYSICAL EXAMINATION:  GENERAL:  59 y.o.-year-old patient sitting on the bed with no acute distress.  EYES: Pupils equal, round, reactive to light and accommodation. No scleral icterus. Extraocular muscles intact.  HEENT: Head atraumatic, normocephalic. Posterior Oropharynx slightly erythematous, left ear canal is inflamed no purulence  NECK:  Supple, no jugular venous distention. No thyroid enlargement, no tenderness.  LUNGS: Normal breath sounds bilaterally, no wheezing, rales,rhonchi or crepitation. No use of accessory muscles of respiration.  CARDIOVASCULAR: S1, S2 normal. No murmurs, rubs, or gallops.  ABDOMEN: Soft, non-tender, non-distended. Bowel sounds present. No organomegaly or mass.   EXTREMITIES: No pedal edema, cyanosis, or clubbing.  NEUROLOGIC: Cranial nerves II through XII are intact. Muscle strength 5/5 in all extremities. Sensation intact. Gait not checked. Essential tremor stable  PSYCHIATRIC: The patient is alert and oriented x 3.  SKIN: No obvious rash, lesion, or ulcer.   DATA REVIEW:   CBC  Recent Labs Lab 03/30/15 0940  WBC 8.5  HGB 11.2*  HCT 35.2  PLT 328    Chemistries   Recent Labs Lab 03/30/15 0940  NA 139  142  K 4.8  4.6  CL 95*  96*  CO2 27  28  GLUCOSE 218*  225*  BUN 69*  70*  CREATININE 12.56*  12.71*  CALCIUM 8.8*  9.0    Cardiac Enzymes  Recent Labs Lab 03/30/15 1349  TROPONINI 0.07*    Microbiology Results  No results found for this or any previous visit.  RADIOLOGY:  Dg Chest 2 View  03/29/2015   CLINICAL DATA:  Shortness of breath, progressed since earlier today  EXAM: CHEST  2 VIEW  COMPARISON:  March 01, 2015  FINDINGS: There is no edema or consolidation. Heart is slightly enlarged with pulmonary vascularity within normal limits. There is no appreciable adenopathy. No bone lesions.  IMPRESSION: Mild cardiac enlargement.  No edema or consolidation.   Electronically Signed   By: Lowella Grip III M.D.   On: 03/29/2015 18:37    EKG:   Orders placed or performed during the hospital encounter of 03/29/15  . EKG 12-Lead  . EKG 12-Lead      Management plans discussed with the patient, family and they are in agreement.  CODE STATUS:     Code Status Orders        Start     Ordered   03/30/15 0051  Full code   Continuous     03/30/15 0051      TOTAL TIME TAKING CARE OF THIS PATIENT: 40 minutes.    Myrtis Ser M.D on 03/31/2015 at 9:59 AM  Between 7am to 6pm - Pager - [phone]  After 6pm go to [email or web address] - password [email or web address] [facility]  Office  [phone]  CC: Primary care physician; [facility], MD

## 2015-03-31 NOTE — Progress Notes (Signed)
SATURATION QUALIFICATIONS: (This note is used to comply with regulatory documentation for home oxygen) ° °Patient Saturations on Room Air at Rest = 90% ° °Patient Saturations on Room Air while Ambulating = 93% ° ° °

## 2015-04-26 ENCOUNTER — Encounter: Admission: RE | Payer: Self-pay | Source: Ambulatory Visit

## 2015-04-26 SURGERY — COLONOSCOPY
Anesthesia: General

## 2015-05-01 ENCOUNTER — Emergency Department
Admission: EM | Admit: 2015-05-01 | Discharge: 2015-05-01 | Disposition: A | Discharge status: Home / Self Care | Payer: Medicare Other | Attending: Emergency Medicine | Admitting: Emergency Medicine

## 2015-05-01 ENCOUNTER — Emergency Department: Payer: Medicare Other

## 2015-05-01 ENCOUNTER — Other Ambulatory Visit: Payer: Self-pay

## 2015-05-01 ENCOUNTER — Encounter: Payer: Self-pay | Admitting: Emergency Medicine

## 2015-05-01 DIAGNOSIS — N186 End stage renal disease: Secondary | ICD-10-CM | POA: Diagnosis not present

## 2015-05-01 DIAGNOSIS — Z720 Tobacco use: Secondary | ICD-10-CM | POA: Insufficient documentation

## 2015-05-01 DIAGNOSIS — Z79899 Other long term (current) drug therapy: Secondary | ICD-10-CM | POA: Insufficient documentation

## 2015-05-01 DIAGNOSIS — Z7951 Long term (current) use of inhaled steroids: Secondary | ICD-10-CM | POA: Diagnosis not present

## 2015-05-01 DIAGNOSIS — Z7982 Long term (current) use of aspirin: Secondary | ICD-10-CM | POA: Diagnosis not present

## 2015-05-01 DIAGNOSIS — J441 Chronic obstructive pulmonary disease with (acute) exacerbation: Secondary | ICD-10-CM | POA: Diagnosis not present

## 2015-05-01 DIAGNOSIS — Z992 Dependence on renal dialysis: Secondary | ICD-10-CM | POA: Insufficient documentation

## 2015-05-01 DIAGNOSIS — R0602 Shortness of breath: Secondary | ICD-10-CM | POA: Diagnosis present

## 2015-05-01 DIAGNOSIS — I120 Hypertensive chronic kidney disease with stage 5 chronic kidney disease or end stage renal disease: Secondary | ICD-10-CM | POA: Insufficient documentation

## 2015-05-01 LAB — COMPREHENSIVE METABOLIC PANEL
ALT: 15 U/L (ref 14–54)
AST: 19 U/L (ref 15–41)
Albumin: 4 g/dL (ref 3.5–5.0)
Alkaline Phosphatase: 108 U/L (ref 38–126)
Anion gap: 12 (ref 5–15)
BUN: 39 mg/dL — ABNORMAL HIGH (ref 6–20)
CO2: 32 mmol/L (ref 22–32)
CREATININE: 8.34 mg/dL — AB (ref 0.44–1.00)
Calcium: 8.7 mg/dL — ABNORMAL LOW (ref 8.9–10.3)
Chloride: 97 mmol/L — ABNORMAL LOW (ref 101–111)
GFR, EST AFRICAN AMERICAN: 5 mL/min — AB (ref >60)
GFR, EST NON AFRICAN AMERICAN: 5 mL/min — AB (ref >60)
Glucose, Bld: 89 mg/dL (ref 65–99)
Potassium: 4.6 mmol/L (ref 3.5–5.1)
Sodium: 141 mmol/L (ref 135–145)
TOTAL PROTEIN: 7.4 g/dL (ref 6.5–8.1)
Total Bilirubin: 0.3 mg/dL (ref 0.3–1.2)

## 2015-05-01 LAB — CBC WITH DIFFERENTIAL/PLATELET
BASOS ABS: 0.1 10*3/uL (ref 0–0.1)
Basophils Relative: 1 %
EOS PCT: 5 %
Eosinophils Absolute: 0.3 10*3/uL (ref 0–0.7)
HCT: 37.5 % (ref 35.0–47.0)
Hemoglobin: 12.1 g/dL (ref 12.0–16.0)
LYMPHS ABS: 1.9 10*3/uL (ref 1.0–3.6)
Lymphocytes Relative: 30 %
MCH: 31.4 pg (ref 26.0–34.0)
MCHC: 32.1 g/dL (ref 32.0–36.0)
MCV: 97.7 fL (ref 80.0–100.0)
Monocytes Absolute: 0.9 10*3/uL (ref 0.2–0.9)
Monocytes Relative: 14 %
NEUTROS ABS: 3.2 10*3/uL (ref 1.4–6.5)
Neutrophils Relative %: 50 %
Platelets: 268 10*3/uL (ref 150–440)
RBC: 3.84 MIL/uL (ref 3.80–5.20)
RDW: 14.6 % — AB (ref 11.5–14.5)
WBC: 6.4 10*3/uL (ref 3.6–11.0)

## 2015-05-01 LAB — TROPONIN I: Troponin I: 0.04 ng/mL — ABNORMAL HIGH (ref <0.031)

## 2015-05-01 MED ORDER — PREDNISONE 20 MG PO TABS: 40.0000 mg | ORAL_TABLET | Freq: Every day | ORAL | Status: DC

## 2015-05-01 MED ORDER — IPRATROPIUM-ALBUTEROL 0.5-2.5 (3) MG/3ML IN SOLN
RESPIRATORY_TRACT | Status: AC
Start: 2015-05-01 — End: 2015-05-01
  Administered 2015-05-01: 3 mL via RESPIRATORY_TRACT
  Filled 2015-05-01: qty 3

## 2015-05-01 MED ORDER — HYDRALAZINE HCL 20 MG/ML IJ SOLN
10.0000 mg | Freq: Once | INTRAMUSCULAR | Status: AC
  Administered 2015-05-01: 10 mg via INTRAVENOUS

## 2015-05-01 MED ORDER — HYDRALAZINE HCL 20 MG/ML IJ SOLN
INTRAMUSCULAR | Status: AC
  Administered 2015-05-01: 10 mg via INTRAVENOUS
  Filled 2015-05-01: qty 1

## 2015-05-01 MED ORDER — METHYLPREDNISOLONE SODIUM SUCC 125 MG IJ SOLR
INTRAMUSCULAR | Status: AC
  Administered 2015-05-01: 125 mg via INTRAVENOUS
  Filled 2015-05-01: qty 2

## 2015-05-01 MED ORDER — IPRATROPIUM-ALBUTEROL 0.5-2.5 (3) MG/3ML IN SOLN
3.0000 mL | Freq: Once | RESPIRATORY_TRACT | Status: AC
  Administered 2015-05-01: 3 mL via RESPIRATORY_TRACT

## 2015-05-01 MED ORDER — METHYLPREDNISOLONE SODIUM SUCC 125 MG IJ SOLR
125.0000 mg | Freq: Once | INTRAMUSCULAR | Status: AC
  Administered 2015-05-01: 125 mg via INTRAVENOUS

## 2015-05-01 NOTE — ED Notes (Signed)
Pt with difficulty breathing. Pt with hx of bronchitis.

## 2015-05-01 NOTE — ED Notes (Signed)
Pt refused wheelchair at this time.

## 2015-05-01 NOTE — ED Provider Notes (Signed)
[facility] Emergency Department Provider Note  Time seen: 7:36 AM  I have reviewed the triage vital signs and the nursing notes.   HISTORY  Chief Complaint Shortness of Breath    HPI Michele Nash is a 59 y.o. female with a past medical history of end-stage renal disease on hemodialysis Monday/Wednesday/Friday, COPD, hypertension who presents to the emergency department with 2 days of shortness of breath. According to the patient since Friday she has been feeling short of breath which has worsened over the weekend. She has been coughing, and believe she might have bronchitis due to the "change in weather." Denies any chest pain, pressure, nausea or diaphoresis. States a history of COPD, continues to smoke but states she has cut back. Patient describes her shortness of breath is moderate in severity, worse with exertion.     Past Medical History  Diagnosis Date  . Chronic kidney disease   . COPD (chronic obstructive pulmonary disease)   . Hypertension   . Hyperlipidemia   . Depression   . Dialysis patient 2014    Patient Active Problem List   Diagnosis Date Noted  . ESRD on hemodialysis 03/29/2015  . HTN (hypertension) 03/29/2015  . COPD (chronic obstructive pulmonary disease) 03/29/2015  . GAD (generalized anxiety disorder) 03/29/2015    Past Surgical History  Procedure Laterality Date  . Abdominal hysterectomy    . Dialysis fistula creation    . Colonoscopy  2011    Current Outpatient Rx  Name  Route  Sig  Dispense  Refill  . albuterol (PROVENTIL HFA;VENTOLIN HFA) 108 (90 BASE) MCG/ACT inhaler   Inhalation   Inhale 2 puffs into the lungs every 6 (six) hours as needed for wheezing or shortness of breath.         . ALPRAZolam (XANAX) 0.5 MG tablet   Oral   Take 0.5 mg by mouth 2 (two) times daily.         Marland Kitchen amLODipine (NORVASC) 10 MG tablet   Oral   Take 1 tablet (10 mg total) by mouth daily.   30 tablet   1   . amLODipine  (NORVASC) 5 MG tablet   Oral   Take 5 mg by mouth daily.         Marland Kitchen amoxicillin-clavulanate (AUGMENTIN) 500-125 MG per tablet   Oral   Take 1 tablet by mouth 2 (two) times daily.         Marland Kitchen aspirin EC 81 MG EC tablet   Oral   Take 1 tablet (81 mg total) by mouth daily.   30 tablet   1   . calcium carbonate (OS-CAL) 600 MG TABS tablet   Oral   Take 600 mg by mouth 2 (two) times daily with a meal.         . carvedilol (COREG) 12.5 MG tablet   Oral   Take 12.5 mg by mouth daily.          . cinacalcet (SENSIPAR) 30 MG tablet   Oral   Take 30 mg by mouth daily.         . ciprofloxacin-dexamethasone (CIPRODEX) otic suspension   Left Ear   Place 4 drops into the left ear 2 (two) times daily.   7.5 mL   0   . ezetimibe (ZETIA) 10 MG tablet   Oral   Take 10 mg by mouth daily.         . fluticasone (VERAMYST) 27.5 MCG/SPRAY nasal spray   Nasal  Place 2 sprays into the nose daily.         . Fluticasone-Salmeterol (ADVAIR) 100-50 MCG/DOSE AEPB   Inhalation   Inhale 1 puff into the lungs 2 (two) times daily.         . furosemide (LASIX) 40 MG tablet   Oral   Take 40 mg by mouth.         Marland Kitchen HYDROcodone-acetaminophen (NORCO/VICODIN) 5-325 MG per tablet   Oral   Take 1 tablet by mouth every 6 (six) hours as needed for moderate pain.         Marland Kitchen losartan (COZAAR) 100 MG tablet   Oral   Take 100 mg by mouth daily.         . multivitamin (RENA-VIT) TABS tablet   Oral   Take 1 tablet by mouth daily.         Marland Kitchen omeprazole (PRILOSEC) 20 MG capsule   Oral   Take 20 mg by mouth daily.         . sertraline (ZOLOFT) 50 MG tablet   Oral   Take 50 mg by mouth daily.           Allergies Sulfa antibiotics  Family History  Problem Relation Age of Onset  . Heart disease Mother   . Cancer Father   . Cancer Sister     Social History History  Substance Use Topics  . Smoking status: Current Every Day Smoker -- 0.50 packs/day  . Smokeless tobacco:  Never Used  . Alcohol Use: No    Review of Systems Constitutional: Negative for fever. Cardiovascular: Negative for chest pain. Respiratory: Positive for shortness of breath Gastrointestinal: Negative for abdominal pain Musculoskeletal: Negative for back pain. Skin: Negative for rash. Neurological: Negative for headache 10-point ROS otherwise negative.  ____________________________________________   PHYSICAL EXAM:  VITAL SIGNS: ED Triage Vitals  Enc Vitals Group     BP 05/01/15 0724 192/102 mmHg     Pulse Rate 05/01/15 0724 85     Resp --      Temp 05/01/15 0724 98.1 F (36.7 C)     Temp Source 05/01/15 0724 Oral     SpO2 05/01/15 0724 95 %     Weight 05/01/15 0724 147 lb (66.679 kg)     Height 05/01/15 0724 5\' 5"  (1.651 m)     Head Cir --      Peak Flow --      Pain Score --      Pain Loc --      Pain Edu? --      Excl. in [place]? --     Constitutional: Alert and oriented. Well appearing and in no distress. ENT   Head: Normocephalic and atraumatic.   Mouth/Throat: Mucous membranes are moist. Cardiovascular: Normal rate, regular rhythm. No murmurs Respiratory: Normal respiratory effort without tachypnea nor retractions. Minimal wheeze bilaterally. No rales or rhonchi. Gastrointestinal: Soft and nontender. No distention.   Musculoskeletal: Nontender with normal range of motion in all extremities. Neurologic:  Normal speech and language. No gross focal neurologic deficits Psychiatric: Mood and affect are normal. Speech and behavior are normal.   ____________________________________________    EKG  EKG reviewed and interpreted by myself shows normal sinus rhythm at 78 bpm, narrow QRS, normal axis, normal intervals, no concerning ST changes noted.  ____________________________________________    RADIOLOGY  Chest x-ray shows chronic bronchitis but no acute findings.  ____________________________________________    INITIAL IMPRESSION / ASSESSMENT AND  PLAN / ED COURSE  Pertinent labs & imaging results that were available during my care of the patient were reviewed by me and considered in my medical decision making (see chart for details).  Patient with 2 days of shortness of breath. Likely COPD/bronchitis. We will treat with DuoNeb, steroid-induced, check labs, EKG, and chest x-ray. We will monitor closely in the emergency department. Currently overall the patient appears well, with an oxygen saturation 97% on room air. Patient is hypertensive around 585 systolic we will monitor this.   ----------------------------------------- 9:21 AM on 05/01/2015 -----------------------------------------  Labs are largely within normal limits. Slight troponin elevation which is baseline for the patient. Chest x-ray within normal limits. Given the patient's persistently elevated blood pressure I will dose hydralazine 10 mg 1, and monitor in the ER. Likely discharge home with steroid-induced and primary care follow-up. I discussed this plan with the patient is agreeable.  ____________________________________________   FINAL CLINICAL IMPRESSION(S) / ED DIAGNOSES  COPD exacerbation Dyspnea   Harvest Dark, MD 05/01/15 [phone]

## 2015-05-01 NOTE — ED Notes (Addendum)
NAD noted at time of D/C. Pt denies questions or concerns. Pt ambulatory to the lobby at this time.  

## 2015-05-01 NOTE — Discharge Instructions (Signed)

## 2015-05-10 ENCOUNTER — Ambulatory Visit: Admission: RE | Admit: 2015-05-10 | Payer: Medicare Other | Source: Ambulatory Visit | Admitting: Gastroenterology

## 2015-05-10 ENCOUNTER — Encounter: Payer: Self-pay | Admitting: *Deleted

## 2015-05-10 ENCOUNTER — Encounter: Admission: RE | Payer: Self-pay | Source: Ambulatory Visit

## 2015-05-10 ENCOUNTER — Encounter
Admission: RE | Disposition: A | Discharge status: Home / Self Care | Payer: Self-pay | Source: Ambulatory Visit | Attending: Vascular Surgery

## 2015-05-10 ENCOUNTER — Ambulatory Visit
Admission: RE | Admit: 2015-05-10 | Discharge: 2015-05-10 | Disposition: A | Discharge status: Home / Self Care | Payer: Medicare Other | Source: Ambulatory Visit | Attending: Vascular Surgery | Admitting: Vascular Surgery

## 2015-05-10 DIAGNOSIS — F17210 Nicotine dependence, cigarettes, uncomplicated: Secondary | ICD-10-CM | POA: Insufficient documentation

## 2015-05-10 DIAGNOSIS — J439 Emphysema, unspecified: Secondary | ICD-10-CM | POA: Insufficient documentation

## 2015-05-10 DIAGNOSIS — N186 End stage renal disease: Secondary | ICD-10-CM | POA: Insufficient documentation

## 2015-05-10 DIAGNOSIS — E669 Obesity, unspecified: Secondary | ICD-10-CM | POA: Diagnosis not present

## 2015-05-10 DIAGNOSIS — Z992 Dependence on renal dialysis: Secondary | ICD-10-CM | POA: Diagnosis not present

## 2015-05-10 DIAGNOSIS — I120 Hypertensive chronic kidney disease with stage 5 chronic kidney disease or end stage renal disease: Secondary | ICD-10-CM | POA: Insufficient documentation

## 2015-05-10 DIAGNOSIS — T82858A Stenosis of vascular prosthetic devices, implants and grafts, initial encounter: Secondary | ICD-10-CM | POA: Diagnosis present

## 2015-05-10 HISTORY — DX: Bronchitis, not specified as acute or chronic: J40

## 2015-05-10 HISTORY — PX: PERIPHERAL VASCULAR CATHETERIZATION: SHX172C

## 2015-05-10 HISTORY — DX: Obesity, unspecified: E66.9

## 2015-05-10 LAB — POTASSIUM (ARMC VASCULAR LAB ONLY): POTASSIUM (ARMC VASCULAR LAB): 5

## 2015-05-10 SURGERY — A/V SHUNTOGRAM/FISTULAGRAM
Anesthesia: Moderate Sedation

## 2015-05-10 SURGERY — A/V SHUNTOGRAM/FISTULAGRAM
Anesthesia: Moderate Sedation | Laterality: Left

## 2015-05-10 MED ORDER — IOHEXOL 300 MG/ML  SOLN
INTRAMUSCULAR | Status: DC | PRN
  Administered 2015-05-10: 55 mL via INTRAVENOUS

## 2015-05-10 MED ORDER — CEFAZOLIN SODIUM 1-5 GM-% IV SOLN
1.0000 g | Freq: Once | INTRAVENOUS | Status: AC
  Administered 2015-05-10: 1 g via INTRAVENOUS

## 2015-05-10 MED ORDER — FENTANYL CITRATE (PF) 100 MCG/2ML IJ SOLN
INTRAMUSCULAR | Status: DC | PRN
  Administered 2015-05-10 (×2): 50 ug via INTRAVENOUS

## 2015-05-10 MED ORDER — LIDOCAINE HCL (PF) 1 % IJ SOLN
INTRAMUSCULAR | Status: AC
  Filled 2015-05-10: qty 10

## 2015-05-10 MED ORDER — HEPARIN SODIUM (PORCINE) 1000 UNIT/ML IJ SOLN
INTRAMUSCULAR | Status: AC
  Filled 2015-05-10: qty 1

## 2015-05-10 MED ORDER — MIDAZOLAM HCL 5 MG/5ML IJ SOLN
INTRAMUSCULAR | Status: AC
  Filled 2015-05-10: qty 5

## 2015-05-10 MED ORDER — MIDAZOLAM HCL 2 MG/2ML IJ SOLN
INTRAMUSCULAR | Status: DC | PRN
  Administered 2015-05-10: 1 mg via INTRAVENOUS
  Administered 2015-05-10: 2 mg via INTRAVENOUS

## 2015-05-10 MED ORDER — CEFAZOLIN SODIUM 1-5 GM-% IV SOLN
INTRAVENOUS | Status: AC
  Filled 2015-05-10: qty 50

## 2015-05-10 MED ORDER — HEPARIN SODIUM (PORCINE) 1000 UNIT/ML IJ SOLN
INTRAMUSCULAR | Status: DC | PRN
  Administered 2015-05-10: 3000 [IU] via INTRAVENOUS

## 2015-05-10 MED ORDER — SODIUM CHLORIDE 0.9 % IV SOLN
INTRAVENOUS | Status: DC
  Administered 2015-05-10: 12:00:00 via INTRAVENOUS

## 2015-05-10 MED ORDER — LIDOCAINE HCL (PF) 1 % IJ SOLN
INTRAMUSCULAR | Status: DC | PRN
  Administered 2015-05-10: 10 mL via SUBCUTANEOUS

## 2015-05-10 MED ORDER — FENTANYL CITRATE (PF) 100 MCG/2ML IJ SOLN
INTRAMUSCULAR | Status: AC
  Filled 2015-05-10: qty 2

## 2015-05-10 MED ORDER — HEPARIN (PORCINE) IN NACL 2-0.9 UNIT/ML-% IJ SOLN
INTRAMUSCULAR | Status: AC
  Filled 2015-05-10: qty 1000

## 2015-05-10 SURGICAL SUPPLY — 14 items
BALLN ATG 12X4X80 (BALLOONS) ×4
BALLN DORADO 10X40X80 (BALLOONS) ×4
BALLN DORADO 8X40X80 (BALLOONS) ×4
BALLOON ATG 12X4X80 (BALLOONS) ×2 IMPLANT
BALLOON DORADO 10X40X80 (BALLOONS) ×2 IMPLANT
BALLOON DORADO 8X40X80 (BALLOONS) ×2 IMPLANT
CATH BEACON 5.038 65CM KMP-01 (CATHETERS) ×4 IMPLANT
GUIDEWIRE ANGLED .035 180CM (WIRE) ×4 IMPLANT
PACK ANGIOGRAPHY (CUSTOM PROCEDURE TRAY) ×4 IMPLANT
SET INTRO CAPELLA COAXIAL (SET/KITS/TRAYS/PACK) ×4 IMPLANT
SHEATH BRITE TIP 6FRX5.5 (SHEATH) ×4 IMPLANT
SHEATH BRITE TIP 7FRX5.5 (SHEATH) ×4 IMPLANT
TOWEL OR 17X26 4PK STRL BLUE (TOWEL DISPOSABLE) ×4 IMPLANT
WIRE MAGIC TOR.035 180C (WIRE) ×4 IMPLANT

## 2015-05-10 NOTE — Op Note (Signed)
OPERATIVE NOTE   PROCEDURE: 1. left brachiocephalic arteriovenous access cannulation 2. left arm fistulogram 3. Percutaneous transluminal angioplasty of the subclavian and cephalic confluence to 8 mm 4. Percutaneous transluminal angioplasty of the venous cannulation site to 12 mm  PRE-OPERATIVE DIAGNOSIS: Complication left arteriovenous fistula                                                       End Stage Renal Disease  POST-OPERATIVE DIAGNOSIS: same as above   SURGEON: Katha Cabal, M.D.  ANESTHESIA: Conscious Sedation   ESTIMATED BLOOD LOSS: minimal  FINDING(S): 1. 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 female who  presents with malfunctioning left AV access.  The patient is scheduled for left brachiocephalic fistula angiography with the hope for intervention .  The patient is aware the risks include but are not limited to: bleeding, infection, thrombosis of the cannulated access, and possible anaphylactic reaction to the contrast.  The patient acknowledges if the access can not be salvaged a tunneled catheter will be needed and will be placed during this procedure.  The patient is aware of the risks of the procedure and elects to proceed with the angiogram and intervention.  DESCRIPTION: After full informed written consent was obtained, the patient was brought back to the Special Procedure suite and placed supine position.  Appropriate cardiopulmonary monitors were placed.  The left arm was prepped and draped in the standard fashion.  Appropriate timeout is called. The left brachiocephalic fistula  was cannulated with a micropuncture needle.  The microwire was advanced and the needle was  exchanged for  a microsheath.  The J-wire was then advanced and a 6 Fr sheath inserted.  Hand injections were completed to image the access from the arterial anastomosis through the entire access.  The central venous structures were also imaged by hand injections.  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 In a caudal view of the subclavian cephalic confluence there is a greater than 70% narrowing central veins are otherwise patent. 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 Arterial anastomosis widely patent.  3000 units of heparin was given and using a floppy Glidewire and Kumpe catheter wire and catheter negotiated into the central venous system. Wire is exchanged the catheter for Magic torque wire. An 8 x 4 Dorado balloon is advanced across the subclavian cephalic confluence and inflated to 24 atm for little over 1 minute. Follow-up imaging demonstrates a significant improvement in the narrowing with less than 20% residual stenosis.  The balloon was then repositioned to the venous lesion within the venous portion of the fistula. Inflation did not improve the site and therefore a 10 x 4 balloon was selected and ultimately a 12 x 4 Atlas balloon was selected and inflated to 22 atm for approximately 1 minute. Follow-up imaging demonstrates an excellent result with minimal residual stenosis.    A 4-0 Monocryl purse-string suture was sewn around the sheath.  The sheath was removed  and light pressure was applied.  A sterile bandage was applied to the puncture site.    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 Is rather tortuous as well. In the mid fistula in the area of that is utilized for venous cannulation there is a high-grade stricture stenosis as described above. This is angioplasty ultimately to 12 mm to allow for  improvement the venous lesion at the confluence of the subclavian and cephalic veins and plasty to 8 mm with significant improvement. Central veins are widely patent. Arterial anastomosis patent.  COMPLICATIONS: None  CONDITION: Michele Nash, M.D [facility] Office: [phone]  05/10/2015 1:56 PM

## 2015-05-10 NOTE — H&P (Signed)
[facility] History & Physical Update  The patient was interviewed and re-examined.  The patient's previous History and Physical has been reviewed and is unchanged.  There is no change in the plan of care.  Michele Nash, Dolores Lory, MD  05/10/2015, 1:04 PM

## 2015-05-10 NOTE — Progress Notes (Signed)
Pt awaiting md to discuss events of procedure

## 2015-05-10 NOTE — Progress Notes (Signed)
Pt and husband wanting to go home. States they are okay with leaving before talking to md.

## 2015-05-11 ENCOUNTER — Encounter: Payer: Self-pay | Admitting: Vascular Surgery

## 2015-05-23 ENCOUNTER — Emergency Department
Admission: EM | Admit: 2015-05-23 | Discharge: 2015-05-23 | Disposition: A | Discharge status: Home / Self Care | Payer: Medicare Other | Attending: Emergency Medicine | Admitting: Emergency Medicine

## 2015-05-23 ENCOUNTER — Emergency Department: Payer: Medicare Other

## 2015-05-23 DIAGNOSIS — Z7952 Long term (current) use of systemic steroids: Secondary | ICD-10-CM | POA: Insufficient documentation

## 2015-05-23 DIAGNOSIS — L02512 Cutaneous abscess of left hand: Secondary | ICD-10-CM | POA: Insufficient documentation

## 2015-05-23 DIAGNOSIS — Z79899 Other long term (current) drug therapy: Secondary | ICD-10-CM | POA: Insufficient documentation

## 2015-05-23 DIAGNOSIS — Z992 Dependence on renal dialysis: Secondary | ICD-10-CM | POA: Insufficient documentation

## 2015-05-23 DIAGNOSIS — N186 End stage renal disease: Secondary | ICD-10-CM | POA: Insufficient documentation

## 2015-05-23 DIAGNOSIS — I120 Hypertensive chronic kidney disease with stage 5 chronic kidney disease or end stage renal disease: Secondary | ICD-10-CM | POA: Insufficient documentation

## 2015-05-23 DIAGNOSIS — M79642 Pain in left hand: Secondary | ICD-10-CM | POA: Diagnosis present

## 2015-05-23 DIAGNOSIS — Z720 Tobacco use: Secondary | ICD-10-CM | POA: Insufficient documentation

## 2015-05-23 DIAGNOSIS — Z7982 Long term (current) use of aspirin: Secondary | ICD-10-CM | POA: Insufficient documentation

## 2015-05-23 DIAGNOSIS — L0291 Cutaneous abscess, unspecified: Secondary | ICD-10-CM

## 2015-05-23 MED ORDER — OXYCODONE-ACETAMINOPHEN 5-325 MG PO TABS: 1.0000 | ORAL_TABLET | ORAL | Status: DC | PRN

## 2015-05-23 MED ORDER — LIDOCAINE HCL (PF) 1 % IJ SOLN
INTRAMUSCULAR | Status: AC
  Filled 2015-05-23: qty 5

## 2015-05-23 MED ORDER — CLINDAMYCIN HCL 300 MG PO CAPS: 300.0000 mg | ORAL_CAPSULE | Freq: Three times a day (TID) | ORAL | Status: DC

## 2015-05-23 NOTE — ED Notes (Signed)
Pt states she has had what the doctor told her was a ganglion cyst on the left ring/4th finger for the past 4 years and has always had drainage, states since Saturday it has swollen more and is painful with foul smelling drainage.Marland Kitchen

## 2015-05-23 NOTE — ED Provider Notes (Signed)
[facility] Emergency Department Provider Note  ____________________________________________  Time seen: Approximately 5:55 PM  I have reviewed the triage vital signs and the nursing notes.   HISTORY  Chief Complaint Hand Pain    HPI Michele Nash is a 59 y.o. female is for evaluation of ganglion cyst, left fourth finger. States been there for about 4 years but is always draining "pus".   Past Medical History  Diagnosis Date  . Chronic kidney disease   . COPD (chronic obstructive pulmonary disease)   . Hypertension   . Hyperlipidemia   . Depression   . Dialysis patient 2014  . Obesity   . Bronchitis     Patient Active Problem List   Diagnosis Date Noted  . ESRD on hemodialysis 03/29/2015  . HTN (hypertension) 03/29/2015  . COPD (chronic obstructive pulmonary disease) 03/29/2015  . GAD (generalized anxiety disorder) 03/29/2015    Past Surgical History  Procedure Laterality Date  . Abdominal hysterectomy    . Dialysis fistula creation    . Colonoscopy  2011  . Peripheral vascular catheterization N/A 05/10/2015    Procedure: A/V Shuntogram/Fistulagram;  Surgeon: Katha Cabal, MD;  Location: [place] [facility];  Service: Cardiovascular;  Laterality: N/A;  . Peripheral vascular catheterization Left 05/10/2015    Procedure: A/V Shunt Intervention;  Surgeon: Katha Cabal, MD;  Location: [place] [facility];  Service: Cardiovascular;  Laterality: Left;    Current Outpatient Rx  Name  Route  Sig  Dispense  Refill  . albuterol (PROVENTIL HFA;VENTOLIN HFA) 108 (90 BASE) MCG/ACT inhaler   Inhalation   Inhale 2 puffs into the lungs every 6 (six) hours as needed for wheezing or shortness of breath.         . ALPRAZolam (XANAX) 0.5 MG tablet   Oral   Take 0.5 mg by mouth 2 (two) times daily.         Marland Kitchen amLODipine (NORVASC) 10 MG tablet   Oral   Take 1 tablet (10 mg total) by mouth daily.   30 tablet   1   . amLODipine (NORVASC)  5 MG tablet   Oral   Take 5 mg by mouth daily.         Marland Kitchen aspirin EC 81 MG EC tablet   Oral   Take 1 tablet (81 mg total) by mouth daily.   30 tablet   1   . calcium carbonate (OS-CAL) 600 MG TABS tablet   Oral   Take 600 mg by mouth 2 (two) times daily with a meal.         . carvedilol (COREG) 12.5 MG tablet   Oral   Take 12.5 mg by mouth daily.          . cinacalcet (SENSIPAR) 30 MG tablet   Oral   Take 30 mg by mouth daily.         . ciprofloxacin-dexamethasone (CIPRODEX) otic suspension   Left Ear   Place 4 drops into the left ear 2 (two) times daily.   7.5 mL   0   . clindamycin (CLEOCIN) 300 MG capsule   Oral   Take 1 capsule (300 mg total) by mouth 3 (three) times daily.   30 capsule   0   . ezetimibe (ZETIA) 10 MG tablet   Oral   Take 10 mg by mouth daily.         . fluticasone (VERAMYST) 27.5 MCG/SPRAY nasal spray   Nasal  Place 2 sprays into the nose daily.         . Fluticasone-Salmeterol (ADVAIR) 100-50 MCG/DOSE AEPB   Inhalation   Inhale 1 puff into the lungs 2 (two) times daily.         . furosemide (LASIX) 40 MG tablet   Oral   Take 40 mg by mouth.         . losartan (COZAAR) 100 MG tablet   Oral   Take 100 mg by mouth daily.         . multivitamin (RENA-VIT) TABS tablet   Oral   Take 1 tablet by mouth daily.         Marland Kitchen omeprazole (PRILOSEC) 20 MG capsule   Oral   Take 20 mg by mouth daily.         Marland Kitchen oxyCODONE-acetaminophen (ROXICET) 5-325 MG per tablet   Oral   Take 1-2 tablets by mouth every 4 (four) hours as needed for severe pain.   15 tablet   0   . predniSONE (DELTASONE) 20 MG tablet   Oral   Take 2 tablets (40 mg total) by mouth daily.   10 tablet   0   . sertraline (ZOLOFT) 50 MG tablet   Oral   Take 50 mg by mouth daily.           Allergies Sulfa antibiotics  Family History  Problem Relation Age of Onset  . Heart disease Mother   . Cancer Father   . Cancer Sister     Social  History History  Substance Use Topics  . Smoking status: Current Every Day Smoker -- 0.50 packs/day  . Smokeless tobacco: Never Used  . Alcohol Use: No    Review of Systems Constitutional: No fever/chills Eyes: No visual changes. ENT: No sore throat. Cardiovascular: Denies chest pain. Respiratory: Denies shortness of breath. Gastrointestinal: No abdominal pain.  No nausea, no vomiting.  No diarrhea.  No constipation. Genitourinary: Negative for dysuria. Musculoskeletal: Negative for back pain. Skin: Positive for pustular drainage on the left fourth finger. Neurological: Negative for headaches, focal weakness or numbness.  10-point ROS otherwise negative.  ____________________________________________   PHYSICAL EXAM:  VITAL SIGNS: ED Triage Vitals  Enc Vitals Group     BP 05/23/15 1532 160/78 mmHg     Pulse Rate 05/23/15 1532 80     Resp 05/23/15 1532 18     Temp 05/23/15 1532 99.3 F (37.4 C)     Temp Source 05/23/15 1532 Oral     SpO2 05/23/15 1532 95 %     Weight 05/23/15 1532 147 lb (66.679 kg)     Height 05/23/15 1532 5\' 5"  (1.651 m)     Head Cir --      Peak Flow --      Pain Score 05/23/15 1544 7     Pain Loc --      Pain Edu? --      Excl. in [place]? --     Constitutional: Alert and oriented. Well appearing and in no acute distress. Eyes: Conjunctivae are normal. PERRL. EOMI. Head: Atraumatic. Nose: No congestion/rhinnorhea. Mouth/Throat: Mucous membranes are moist.  Oropharynx non-erythematous. Neck: No stridor.   Cardiovascular: Normal rate, regular rhythm. Grossly normal heart sounds.  Good peripheral circulation. Respiratory: Normal respiratory effort.  No retractions. Lungs CTAB. Gastrointestinal: Soft and nontender. No distention. No abdominal bruits. No CVA tenderness. Musculoskeletal: No lower extremity tenderness nor edema.  No joint effusions. Neurologic:  Normal speech and language. No  gross focal neurologic deficits are appreciated. Speech is  normal. No gait instability. Skin:  Skin is warm, dry and intact. Infection with pus coming from the PIP of the left fourth finger. His of erythema. Minimal swelling. Psychiatric: Mood and affect are normal. Speech and behavior are normal.  ____________________________________________   LABS (all labs ordered are listed, but only abnormal results are displayed)  Labs Reviewed - No data to display ____________________________________________  EKG  Not applicable ____________________________________________  RADIOLOGY  Negative for bony involvement. Interpreted by radiologist and reviewed by myself. ____________________________________________   PROCEDURES  Procedure(s) performed: Yes      Critical CareINCISION AND DRAINAGE Performed by: Arlyss Repress Consent: Verbal consent obtained. Risks and benefits: risks, benefits and alternatives were discussed Type: abscess  Body area: Left fourth finger PIP  Anesthesia: Digital block performed  Incision was made with a scalpel. #11 blade  Local anesthetic: lidocaine 1% without epinephrine  Anesthetic total: 3 ml  Complexity: complex Blunt dissection to break up loculations  Drainage: purulent  Drainage amount: Minimal   Packing material: None   Patient tolerance: Patient tolerated the procedure well with no immediate complications.   performed: No  ____________________________________________   INITIAL IMPRESSION / ASSESSMENT AND PLAN / ED COURSE  Pertinent labs & imaging results that were available during my care of the patient were reviewed by me and considered in my medical decision making (see chart for details).  Abscess left fourth finger. I&D performed. Rx prescription given for clindamycin and oxycodone. She voices no other emergency complaints at this time and will follow-up with any worsening symptomology. ____________________________________________   FINAL CLINICAL IMPRESSION(S) / ED  DIAGNOSES  Final diagnoses:  Abscess       Arlyss Repress, PA-C 05/24/15 0040  Pierce Crane Jerimah Witucki, PA-C 05/24/15 0040  Ponciano Ort, MD 06/03/15 0025

## 2015-05-23 NOTE — Discharge Instructions (Signed)

## 2015-05-24 ENCOUNTER — Emergency Department
Admission: EM | Admit: 2015-05-24 | Discharge: 2015-05-24 | Disposition: A | Discharge status: Home / Self Care | Payer: Medicare Other | Attending: Emergency Medicine | Admitting: Emergency Medicine

## 2015-05-24 ENCOUNTER — Encounter: Payer: Self-pay | Admitting: Emergency Medicine

## 2015-05-24 DIAGNOSIS — Z720 Tobacco use: Secondary | ICD-10-CM | POA: Diagnosis not present

## 2015-05-24 DIAGNOSIS — Z792 Long term (current) use of antibiotics: Secondary | ICD-10-CM | POA: Diagnosis not present

## 2015-05-24 DIAGNOSIS — E785 Hyperlipidemia, unspecified: Secondary | ICD-10-CM | POA: Insufficient documentation

## 2015-05-24 DIAGNOSIS — Z5189 Encounter for other specified aftercare: Secondary | ICD-10-CM

## 2015-05-24 DIAGNOSIS — Z7952 Long term (current) use of systemic steroids: Secondary | ICD-10-CM | POA: Diagnosis not present

## 2015-05-24 DIAGNOSIS — Z7982 Long term (current) use of aspirin: Secondary | ICD-10-CM | POA: Insufficient documentation

## 2015-05-24 DIAGNOSIS — F419 Anxiety disorder, unspecified: Secondary | ICD-10-CM | POA: Insufficient documentation

## 2015-05-24 DIAGNOSIS — L539 Erythematous condition, unspecified: Secondary | ICD-10-CM | POA: Diagnosis not present

## 2015-05-24 DIAGNOSIS — Z79899 Other long term (current) drug therapy: Secondary | ICD-10-CM | POA: Diagnosis not present

## 2015-05-24 DIAGNOSIS — N189 Chronic kidney disease, unspecified: Secondary | ICD-10-CM | POA: Insufficient documentation

## 2015-05-24 DIAGNOSIS — I129 Hypertensive chronic kidney disease with stage 1 through stage 4 chronic kidney disease, or unspecified chronic kidney disease: Secondary | ICD-10-CM | POA: Diagnosis not present

## 2015-05-24 DIAGNOSIS — Z4801 Encounter for change or removal of surgical wound dressing: Secondary | ICD-10-CM | POA: Diagnosis not present

## 2015-05-24 NOTE — ED Notes (Signed)
Here for wound check to left ring finger

## 2015-05-24 NOTE — ED Provider Notes (Signed)
[facility] Emergency Department Provider Note  ____________________________________________  Time seen: Approximately 5:45 PM  I have reviewed the triage vital signs and the nursing notes.   HISTORY  Chief Complaint Wound Check    HPI Michele Nash is a 59 y.o. female patient here today for follow-up in the abscess on the fourth digit left hand performed yesterday. Patient has been decreased pain and decreased swelling since the procedure. Patient is here today for wound check. Patient states no fever and no drainage at this time. Patient rated her pain as a 1/10.   Past Medical History  Diagnosis Date  . Chronic kidney disease   . COPD (chronic obstructive pulmonary disease)   . Hypertension   . Hyperlipidemia   . Depression   . Dialysis patient 2014  . Obesity   . Bronchitis     Patient Active Problem List   Diagnosis Date Noted  . ESRD on hemodialysis 03/29/2015  . HTN (hypertension) 03/29/2015  . COPD (chronic obstructive pulmonary disease) 03/29/2015  . GAD (generalized anxiety disorder) 03/29/2015    Past Surgical History  Procedure Laterality Date  . Abdominal hysterectomy    . Dialysis fistula creation    . Colonoscopy  2011  . Peripheral vascular catheterization N/A 05/10/2015    Procedure: A/V Shuntogram/Fistulagram;  Surgeon: Katha Cabal, MD;  Location: [place] [facility];  Service: Cardiovascular;  Laterality: N/A;  . Peripheral vascular catheterization Left 05/10/2015    Procedure: A/V Shunt Intervention;  Surgeon: Katha Cabal, MD;  Location: [place] [facility];  Service: Cardiovascular;  Laterality: Left;    Current Outpatient Rx  Name  Route  Sig  Dispense  Refill  . albuterol (PROVENTIL HFA;VENTOLIN HFA) 108 (90 BASE) MCG/ACT inhaler   Inhalation   Inhale 2 puffs into the lungs every 6 (six) hours as needed for wheezing or shortness of breath.         . ALPRAZolam (XANAX) 0.5 MG tablet   Oral   Take  0.5 mg by mouth 2 (two) times daily.         Marland Kitchen amLODipine (NORVASC) 10 MG tablet   Oral   Take 1 tablet (10 mg total) by mouth daily.   30 tablet   1   . amLODipine (NORVASC) 5 MG tablet   Oral   Take 5 mg by mouth daily.         Marland Kitchen aspirin EC 81 MG EC tablet   Oral   Take 1 tablet (81 mg total) by mouth daily.   30 tablet   1   . calcium carbonate (OS-CAL) 600 MG TABS tablet   Oral   Take 600 mg by mouth 2 (two) times daily with a meal.         . carvedilol (COREG) 12.5 MG tablet   Oral   Take 12.5 mg by mouth daily.          . cinacalcet (SENSIPAR) 30 MG tablet   Oral   Take 30 mg by mouth daily.         . ciprofloxacin-dexamethasone (CIPRODEX) otic suspension   Left Ear   Place 4 drops into the left ear 2 (two) times daily.   7.5 mL   0   . clindamycin (CLEOCIN) 300 MG capsule   Oral   Take 1 capsule (300 mg total) by mouth 3 (three) times daily.   30 capsule   0   . ezetimibe (ZETIA) 10 MG tablet  Oral   Take 10 mg by mouth daily.         . fluticasone (VERAMYST) 27.5 MCG/SPRAY nasal spray   Nasal   Place 2 sprays into the nose daily.         . Fluticasone-Salmeterol (ADVAIR) 100-50 MCG/DOSE AEPB   Inhalation   Inhale 1 puff into the lungs 2 (two) times daily.         . furosemide (LASIX) 40 MG tablet   Oral   Take 40 mg by mouth.         . losartan (COZAAR) 100 MG tablet   Oral   Take 100 mg by mouth daily.         . multivitamin (RENA-VIT) TABS tablet   Oral   Take 1 tablet by mouth daily.         Marland Kitchen omeprazole (PRILOSEC) 20 MG capsule   Oral   Take 20 mg by mouth daily.         Marland Kitchen oxyCODONE-acetaminophen (ROXICET) 5-325 MG per tablet   Oral   Take 1-2 tablets by mouth every 4 (four) hours as needed for severe pain.   15 tablet   0   . predniSONE (DELTASONE) 20 MG tablet   Oral   Take 2 tablets (40 mg total) by mouth daily.   10 tablet   0   . sertraline (ZOLOFT) 50 MG tablet   Oral   Take 50 mg by mouth  daily.           Allergies Sulfa antibiotics  Family History  Problem Relation Age of Onset  . Heart disease Mother   . Cancer Father   . Cancer Sister     Social History History  Substance Use Topics  . Smoking status: Current Every Day Smoker -- 0.50 packs/day  . Smokeless tobacco: Never Used  . Alcohol Use: No    Review of Systems Constitutional: No fever/chills Eyes: No visual changes. ENT: No sore throat. Cardiovascular: Denies chest pain. Respiratory: Denies shortness of breath. Gastrointestinal: No abdominal pain.  No nausea, no vomiting.  No diarrhea.  No constipation. Genitourinary: Negative for dysuria. Musculoskeletal: Negative for back pain. Skin: Edema and mild erythema to the fourth digit left hand. Neurological: Negative for headaches, focal weakness or numbness. Psychiatric:Anxiety. Endocrine:Hypertension and hyperlipidemia. Allergic/Immunilogical: Medication list.  10-point ROS otherwise negative.  ____________________________________________   PHYSICAL EXAM:  VITAL SIGNS: ED Triage Vitals  Enc Vitals Group     BP 05/24/15 1721 180/96 mmHg     Pulse Rate 05/24/15 1721 78     Resp 05/24/15 1721 19     Temp 05/24/15 1721 98.7 F (37.1 C)     Temp Source 05/24/15 1721 Oral     SpO2 05/24/15 1721 96 %     Weight 05/24/15 1721 147 lb (66.679 kg)     Height 05/24/15 1721 5\' 5"  (1.651 m)     Head Cir --      Peak Flow --      Pain Score --      Pain Loc --      Pain Edu? --      Excl. in [place]? --     Constitutional: Alert and oriented. Well appearing and in no acute distress. Eyes: Conjunctivae are normal. PERRL. EOMI. Head: Atraumatic. Nose: No congestion/rhinnorhea. Mouth/Throat: Mucous membranes are moist.  Oropharynx non-erythematous. Neck: No stridor.  No cervical spine tenderness to palpation Hematological/Lymphatic/Immunilogical: No cervical lymphadenopathy. Cardiovascular: Normal rate, regular rhythm. Grossly normal  heart  sounds.  Good peripheral circulation. Respiratory: Normal respiratory effort.  No retractions. Lungs CTAB. Gastrointestinal: Soft and nontender. No distention. No abdominal bruits. No CVA tenderness. Musculoskeletal: No lower extremity tenderness nor edema.  No joint effusions. Neurologic:  Normal speech and language. No gross focal neurologic deficits are appreciated. Speech is normal. No gait instability. Skin:  Skin is warm, dry and intact. No rash noted. Mild edema and erythema no discharge from the wound site at this time. Digit is neurovascular intact free nuchal range of motion. Psychiatric: Mood and affect are normal. Speech and behavior are normal.  ____________________________________________   LABS (all labs ordered are listed, but only abnormal results are displayed)  Labs Reviewed - No data to display ____________________________________________  EKG   ____________________________________________  RADIOLOGY   ____________________________________________   PROCEDURES  Procedure(s) performed: None  Critical Care performed: No  ____________________________________________   INITIAL IMPRESSION / ASSESSMENT AND PLAN / ED COURSE  Pertinent labs & imaging results that were available during my care of the patient were reviewed by me and considered in my medical decision making (see chart for details).  Healing abscess status post I&D of the fourth digit left hand. Patient advised to continue wound care as directed and continue previous medications. Patient advised return to the ER for condition worsens. FINAL CLINICAL IMPRESSION(S) / ED DIAGNOSES  Final diagnoses:  Wound check, abscess      Sable Feil, PA-C 05/24/15 1752  Joanne Gavel, MD 05/24/15 2046

## 2015-05-24 NOTE — Discharge Instructions (Signed)
Continue previous medications and return if condition worsen. Wear splint for 2-3 days.

## 2015-05-24 NOTE — ED Notes (Addendum)
Pt seen and assessed by provider, see providers note for details.

## 2015-07-26 ENCOUNTER — Telehealth: Payer: Self-pay | Admitting: Gastroenterology

## 2015-07-26 ENCOUNTER — Other Ambulatory Visit: Payer: Self-pay

## 2015-07-26 NOTE — Telephone Encounter (Signed)
Gastroenterology Pre-Procedure Review  Request Date:08-30-2015  Requesting Physician: Dr.Jadali  PATIENT REVIEW QUESTIONS: The patient responded to the following health history questions as indicated:    1. Are you having any GI issues? no 2. Do you have a personal history of Polyps? no 3. Do you have a family history of Colon Cancer or Polyps? yes (Niece Colon cancer, Sister polyps) 4. Diabetes Mellitus? no 5. Joint replacements in the past 12 months?no 6. Major health problems in the past 3 months?no 7. Any artificial heart valves, MVP, or defibrillator?no    MEDICATIONS & ALLERGIES:    Patient reports the following regarding taking any anticoagulation/antiplatelet therapy:   Plavix, Coumadin, Eliquis, Xarelto, Lovenox, Pradaxa, Brilinta, or Effient? yes (Per patient they give her a blood thinner when she goes for dialysis 3 times aweek) Aspirin? no  Patient confirms/reports the following medications:  Current Outpatient Prescriptions  Medication Sig Dispense Refill   albuterol (PROVENTIL HFA;VENTOLIN HFA) 108 (90 BASE) MCG/ACT inhaler Inhale 2 puffs into the lungs every 6 (six) hours as needed for wheezing or shortness of breath.     ALPRAZolam (XANAX) 0.5 MG tablet Take 0.5 mg by mouth 2 (two) times daily.     amLODipine (NORVASC) 10 MG tablet Take 1 tablet (10 mg total) by mouth daily. 30 tablet 1   amLODipine (NORVASC) 5 MG tablet Take 5 mg by mouth daily.     aspirin EC 81 MG EC tablet Take 1 tablet (81 mg total) by mouth daily. 30 tablet 1   calcium carbonate (OS-CAL) 600 MG TABS tablet Take 600 mg by mouth 2 (two) times daily with a meal.     carvedilol (COREG) 12.5 MG tablet Take 12.5 mg by mouth daily.      cinacalcet (SENSIPAR) 30 MG tablet Take 30 mg by mouth daily.     ciprofloxacin-dexamethasone (CIPRODEX) otic suspension Place 4 drops into the left ear 2 (two) times daily. 7.5 mL 0   clindamycin (CLEOCIN) 300 MG capsule Take 1 capsule (300 mg total) by mouth 3  (three) times daily. 30 capsule 0   ezetimibe (ZETIA) 10 MG tablet Take 10 mg by mouth daily.     fluticasone (VERAMYST) 27.5 MCG/SPRAY nasal spray Place 2 sprays into the nose daily.     Fluticasone-Salmeterol (ADVAIR) 100-50 MCG/DOSE AEPB Inhale 1 puff into the lungs 2 (two) times daily.     furosemide (LASIX) 40 MG tablet Take 40 mg by mouth.     losartan (COZAAR) 100 MG tablet Take 100 mg by mouth daily.     multivitamin (RENA-VIT) TABS tablet Take 1 tablet by mouth daily.     omeprazole (PRILOSEC) 20 MG capsule Take 20 mg by mouth daily.     oxyCODONE-acetaminophen (ROXICET) 5-325 MG per tablet Take 1-2 tablets by mouth every 4 (four) hours as needed for severe pain. 15 tablet 0   predniSONE (DELTASONE) 20 MG tablet Take 2 tablets (40 mg total) by mouth daily. 10 tablet 0   sertraline (ZOLOFT) 50 MG tablet Take 50 mg by mouth daily.     No current facility-administered medications for this visit.    Patient confirms/reports the following allergies:  Allergies  Allergen Reactions   Sulfa Antibiotics Swelling    No orders of the defined types were placed in this encounter.    AUTHORIZATION INFORMATION Primary Insurance: 1D#: Group #:  Secondary Insurance: 1D#: Group #:  SCHEDULE INFORMATION: Date: 08-30-2015 Time: Location:[facility]

## 2015-07-26 NOTE — Telephone Encounter (Signed)
Called pt regarding blood thinner. Pt couldn't remember what she is given. Called [facility] in [place] and they stated she is given heparin. Will need to send an order to them 1 week prior to procedure requesting they stop this.

## 2015-07-27 ENCOUNTER — Emergency Department: Payer: Medicare Other

## 2015-07-27 ENCOUNTER — Encounter: Payer: Self-pay | Admitting: Student

## 2015-07-27 ENCOUNTER — Emergency Department
Admission: EM | Admit: 2015-07-27 | Discharge: 2015-07-27 | Disposition: A | Discharge status: Home / Self Care | Payer: Medicare Other | Attending: Emergency Medicine | Admitting: Emergency Medicine

## 2015-07-27 DIAGNOSIS — N186 End stage renal disease: Secondary | ICD-10-CM | POA: Diagnosis not present

## 2015-07-27 DIAGNOSIS — Z7982 Long term (current) use of aspirin: Secondary | ICD-10-CM | POA: Diagnosis not present

## 2015-07-27 DIAGNOSIS — Z792 Long term (current) use of antibiotics: Secondary | ICD-10-CM | POA: Diagnosis not present

## 2015-07-27 DIAGNOSIS — Z87891 Personal history of nicotine dependence: Secondary | ICD-10-CM | POA: Insufficient documentation

## 2015-07-27 DIAGNOSIS — I120 Hypertensive chronic kidney disease with stage 5 chronic kidney disease or end stage renal disease: Secondary | ICD-10-CM | POA: Insufficient documentation

## 2015-07-27 DIAGNOSIS — Z992 Dependence on renal dialysis: Secondary | ICD-10-CM | POA: Insufficient documentation

## 2015-07-27 DIAGNOSIS — R109 Unspecified abdominal pain: Secondary | ICD-10-CM | POA: Diagnosis present

## 2015-07-27 DIAGNOSIS — N3090 Cystitis, unspecified without hematuria: Secondary | ICD-10-CM

## 2015-07-27 DIAGNOSIS — Z79899 Other long term (current) drug therapy: Secondary | ICD-10-CM | POA: Diagnosis not present

## 2015-07-27 LAB — CBC
HEMATOCRIT: 34.4 % — AB (ref 35.0–47.0)
HEMOGLOBIN: 11.1 g/dL — AB (ref 12.0–16.0)
MCH: 30.9 pg (ref 26.0–34.0)
MCHC: 32.3 g/dL (ref 32.0–36.0)
MCV: 95.6 fL (ref 80.0–100.0)
Platelets: 300 10*3/uL (ref 150–440)
RBC: 3.6 MIL/uL — ABNORMAL LOW (ref 3.80–5.20)
RDW: 13.9 % (ref 11.5–14.5)
WBC: 9.2 10*3/uL (ref 3.6–11.0)

## 2015-07-27 LAB — BASIC METABOLIC PANEL
ANION GAP: 11 (ref 5–15)
BUN: 26 mg/dL — ABNORMAL HIGH (ref 6–20)
CHLORIDE: 97 mmol/L — AB (ref 101–111)
CO2: 29 mmol/L (ref 22–32)
Calcium: 8.8 mg/dL — ABNORMAL LOW (ref 8.9–10.3)
Creatinine, Ser: 6.94 mg/dL — ABNORMAL HIGH (ref 0.44–1.00)
GFR calc non Af Amer: 6 mL/min — ABNORMAL LOW (ref >60)
GFR, EST AFRICAN AMERICAN: 7 mL/min — AB (ref >60)
Glucose, Bld: 172 mg/dL — ABNORMAL HIGH (ref 65–99)
Potassium: 4 mmol/L (ref 3.5–5.1)
SODIUM: 137 mmol/L (ref 135–145)

## 2015-07-27 LAB — URINALYSIS COMPLETE WITH MICROSCOPIC (ARMC ONLY)
BACTERIA UA: NONE SEEN
Bilirubin Urine: NEGATIVE
Glucose, UA: 150 mg/dL — AB
KETONES UR: NEGATIVE mg/dL
Nitrite: NEGATIVE
Protein, ur: 100 mg/dL — AB
Specific Gravity, Urine: 1.008 (ref 1.005–1.030)
pH: 9 — ABNORMAL HIGH (ref 5.0–8.0)

## 2015-07-27 MED ORDER — FOSFOMYCIN TROMETHAMINE 3 G PO PACK
PACK | ORAL | Status: AC
  Administered 2015-07-27: 3 g via ORAL
  Filled 2015-07-27: qty 3

## 2015-07-27 MED ORDER — FOSFOMYCIN TROMETHAMINE 3 G PO PACK
3.0000 g | PACK | Freq: Once | ORAL | Status: AC
  Administered 2015-07-27: 3 g via ORAL

## 2015-07-27 NOTE — Discharge Instructions (Signed)
You were seen in the emergency room today for left-sided flank pain. Your urinalysis shows that you have a urinary tract infection. We gave you an antibiotic called fosfomycin today to help treat this infection. We also sent a urine culture which can be followed up to ensure that your infection is being adequately treated by the Keflex you are taking at home.  Continue to follow up with your nephrologist for further monitoring of your symptoms.  Urinary Tract Infection Urinary tract infections (UTIs) can develop anywhere along your urinary tract. Your urinary tract is your body's drainage system for removing wastes and extra water. Your urinary tract includes two kidneys, two ureters, a bladder, and a urethra. Your kidneys are a pair of bean-shaped organs. Each kidney is about the size of your fist. They are located below your ribs, one on each side of your spine. CAUSES Infections are caused by microbes, which are microscopic organisms, including fungi, viruses, and bacteria. These organisms are so small that they can only be seen through a microscope. Bacteria are the microbes that most commonly cause UTIs. SYMPTOMS  Symptoms of UTIs may vary by age and gender of the patient and by the location of the infection. Symptoms in young women typically include a frequent and intense urge to urinate and a painful, burning feeling in the bladder or urethra during urination. Older women and men are more likely to be tired, shaky, and weak and have muscle aches and abdominal pain. A fever may mean the infection is in your kidneys. Other symptoms of a kidney infection include pain in your back or sides below the ribs, nausea, and vomiting. DIAGNOSIS To diagnose a UTI, your caregiver will ask you about your symptoms. Your caregiver also will ask to provide a urine sample. The urine sample will be tested for bacteria and white blood cells. White blood cells are made by your body to help fight infection. TREATMENT    Typically, UTIs can be treated with medication. Because most UTIs are caused by a bacterial infection, they usually can be treated with the use of antibiotics. The choice of antibiotic and length of treatment depend on your symptoms and the type of bacteria causing your infection. HOME CARE INSTRUCTIONS  If you were prescribed antibiotics, take them exactly as your caregiver instructs you. Finish the medication even if you feel better after you have only taken some of the medication.  Drink enough water and fluids to keep your urine clear or pale yellow.  Avoid caffeine, tea, and carbonated beverages. They tend to irritate your bladder.  Empty your bladder often. Avoid holding urine for long periods of time.  Empty your bladder before and after sexual intercourse.  After a bowel movement, women should cleanse from front to back. Use each tissue only once. SEEK MEDICAL CARE IF:   You have back pain.  You develop a fever.  Your symptoms do not begin to resolve within 3 days. SEEK IMMEDIATE MEDICAL CARE IF:   You have severe back pain or lower abdominal pain.  You develop chills.  You have nausea or vomiting.  You have continued burning or discomfort with urination. MAKE SURE YOU:   Understand these instructions.  Will watch your condition.  Will get help right away if you are not doing well or get worse. Document Released: 08/22/2005 Document Revised: 05/13/2012 Document Reviewed: 12/21/2011 [facility] Patient Information 2015 [place], [place]. This information is not intended to replace advice given to you by your health care provider. Make  sure you discuss any questions you have with your health care provider.

## 2015-07-27 NOTE — ED Notes (Signed)
Pt presents with left side flank pain. Went to dialysis today, scheduled for 3 hour session, only able to complete 2 hours due to the pain. Denies nausea, vomiting and diarrhea.

## 2015-07-27 NOTE — ED Provider Notes (Signed)
[facility] Emergency Department Provider Note  ____________________________________________  Time seen: 6:00 PM  I have reviewed the triage vital signs and the nursing notes.   HISTORY  Chief Complaint Flank Pain    HPI NICKIE WARWICK is a 59 y.o. female who complains of left flank pain for the past 2 weeks. It's off-and-on but today since 2 AM it's been more severe than usual. She has end-stage renal disease on dialysis, and does produce a small amount of urine every day. She notes that she's had dysuria for 1 week and her nephrologist Dr. Candiss Norse put her on Keflex. She notes that her urine output seems to been decreased over the last several days and that she has an urge to go but often is not able to produce any urine.  Denies fever chills chest pain shortness of breath dizziness or syncope. Eating and drinking normally per her prescribed diet. No falls or trauma.  Pain in the left flank is worse with movement.     Past Medical History  Diagnosis Date  . Chronic kidney disease   . COPD (chronic obstructive pulmonary disease)   . Hypertension   . Hyperlipidemia   . Depression   . Dialysis patient 2014  . Obesity   . Bronchitis      Patient Active Problem List   Diagnosis Date Noted  . ESRD on hemodialysis 03/29/2015  . HTN (hypertension) 03/29/2015  . COPD (chronic obstructive pulmonary disease) 03/29/2015  . GAD (generalized anxiety disorder) 03/29/2015     Past Surgical History  Procedure Laterality Date  . Abdominal hysterectomy    . Dialysis fistula creation    . Colonoscopy  2011  . Peripheral vascular catheterization N/A 05/10/2015    Procedure: A/V Shuntogram/Fistulagram;  Surgeon: Katha Cabal, MD;  Location: [place] [facility];  Service: Cardiovascular;  Laterality: N/A;  . Peripheral vascular catheterization Left 05/10/2015    Procedure: A/V Shunt Intervention;  Surgeon: Katha Cabal, MD;  Location: [place] [facility];  Service: Cardiovascular;  Laterality: Left;     Current Outpatient Rx  Name  Route  Sig  Dispense  Refill  . albuterol (PROVENTIL HFA;VENTOLIN HFA) 108 (90 BASE) MCG/ACT inhaler   Inhalation   Inhale 2 puffs into the lungs every 6 (six) hours as needed for wheezing or shortness of breath.         . ALPRAZolam (XANAX) 0.5 MG tablet   Oral   Take 0.5 mg by mouth 2 (two) times daily.         Marland Kitchen amLODipine (NORVASC) 10 MG tablet   Oral   Take 1 tablet (10 mg total) by mouth daily.   30 tablet   1   . amLODipine (NORVASC) 5 MG tablet   Oral   Take 5 mg by mouth daily.         Marland Kitchen aspirin EC 81 MG EC tablet   Oral   Take 1 tablet (81 mg total) by mouth daily.   30 tablet   1   . calcium carbonate (OS-CAL) 600 MG TABS tablet   Oral   Take 600 mg by mouth 2 (two) times daily with a meal.         . carvedilol (COREG) 12.5 MG tablet   Oral   Take 12.5 mg by mouth daily.          . cinacalcet (SENSIPAR) 30 MG tablet   Oral   Take 30 mg by mouth daily.         Marland Kitchen  ciprofloxacin-dexamethasone (CIPRODEX) otic suspension   Left Ear   Place 4 drops into the left ear 2 (two) times daily.   7.5 mL   0   . clindamycin (CLEOCIN) 300 MG capsule   Oral   Take 1 capsule (300 mg total) by mouth 3 (three) times daily.   30 capsule   0   . ezetimibe (ZETIA) 10 MG tablet   Oral   Take 10 mg by mouth daily.         . fluticasone (VERAMYST) 27.5 MCG/SPRAY nasal spray   Nasal   Place 2 sprays into the nose daily.         . Fluticasone-Salmeterol (ADVAIR) 100-50 MCG/DOSE AEPB   Inhalation   Inhale 1 puff into the lungs 2 (two) times daily.         . furosemide (LASIX) 40 MG tablet   Oral   Take 40 mg by mouth.         . losartan (COZAAR) 100 MG tablet   Oral   Take 100 mg by mouth daily.         . multivitamin (RENA-VIT) TABS tablet   Oral   Take 1 tablet by mouth daily.         Marland Kitchen omeprazole (PRILOSEC) 20 MG capsule   Oral   Take 20 mg by  mouth daily.         Marland Kitchen oxyCODONE-acetaminophen (ROXICET) 5-325 MG per tablet   Oral   Take 1-2 tablets by mouth every 4 (four) hours as needed for severe pain.   15 tablet   0   . predniSONE (DELTASONE) 20 MG tablet   Oral   Take 2 tablets (40 mg total) by mouth daily.   10 tablet   0   . sertraline (ZOLOFT) 50 MG tablet   Oral   Take 50 mg by mouth daily.            Allergies Sulfa antibiotics   Family History  Problem Relation Age of Onset  . Heart disease Mother   . Cancer Father   . Cancer Sister     Social History Social History  Substance Use Topics  . Smoking status: Former Smoker -- 0.00 packs/day    Quit date: 11/23/2014  . Smokeless tobacco: Never Used  . Alcohol Use: No    Review of Systems  Constitutional:   No fever or chills. No weight changes Eyes:   No blurry vision or double vision.  ENT:   No sore throat. Cardiovascular:   No chest pain. Respiratory:   No dyspnea or cough. Gastrointestinal:   Left flank pain without vomiting or diarrhea  No BRBPR or melena. Genitourinary:   Positive for dysuria, urgency, oliguria. Musculoskeletal:   Negative for back pain. No joint swelling or pain. Skin:   Negative for rash. Neurological:   Negative for headaches, focal weakness or numbness. Psychiatric:  No anxiety or depression.   Endocrine:  No hot/cold intolerance, changes in energy, or sleep difficulty.  10-point ROS otherwise negative.  ____________________________________________   PHYSICAL EXAM:  VITAL SIGNS: ED Triage Vitals  Enc Vitals Group     BP 07/27/15 1616 150/83 mmHg     Pulse Rate 07/27/15 1616 81     Resp 07/27/15 1616 20     Temp 07/27/15 1616 99.1 F (37.3 C)     Temp Source 07/27/15 1616 Oral     SpO2 07/27/15 1616 97 %     Weight 07/27/15 1616 145  lb (65.772 kg)     Height 07/27/15 1616 5\' 5"  (1.651 m)     Head Cir --      Peak Flow --      Pain Score 07/27/15 1752 8     Pain Loc --      Pain Edu? --       Excl. in [place]? --      Constitutional:   Alert and oriented. Well appearing and in no distress. Eyes:   No scleral icterus. No conjunctival pallor. PERRL. EOMI ENT   Head:   Normocephalic and atraumatic.   Nose:   No congestion/rhinnorhea. No septal hematoma   Mouth/Throat:   MMM, no pharyngeal erythema. No peritonsillar mass. No uvula shift.   Neck:   No stridor. No SubQ emphysema. No meningismus. Hematological/Lymphatic/Immunilogical:   No cervical lymphadenopathy. Cardiovascular:   RRR. Normal and symmetric distal pulses are present in all extremities. No murmurs, rubs, or gallops. Respiratory:   Normal respiratory effort without tachypnea nor retractions. Breath sounds are clear and equal bilaterally. No wheezes/rales/rhonchi. Gastrointestinal:   Soft with mild diffuse tenderness that is pronounced and more severe in the suprapubic area. There is also significant tenderness in the left mid abdomen. No distention. There is no CVA tenderness.  No rebound, rigidity, or guarding. Genitourinary:   deferred Musculoskeletal:   Nontender with normal range of motion in all extremities. No joint effusions.  No lower extremity tenderness.  No edema. Neurologic:   Normal speech and language.  CN 2-10 normal. Motor grossly intact. No pronator drift.  Normal gait. No gross focal neurologic deficits are appreciated.  Skin:    Skin is warm, dry and intact. No rash noted.  No petechiae, purpura, or bullae. Psychiatric:   Mood and affect are normal. Speech and behavior are normal. Patient exhibits appropriate insight and judgment.  ____________________________________________    LABS (pertinent positives/negatives) (all labs ordered are listed, but only abnormal results are displayed) Labs Reviewed  CBC - Abnormal; Notable for the following:    RBC 3.60 (*)    Hemoglobin 11.1 (*)    HCT 34.4 (*)    All other components within normal limits  BASIC METABOLIC PANEL - Abnormal; Notable  for the following:    Chloride 97 (*)    Glucose, Bld 172 (*)    BUN 26 (*)    Creatinine, Ser 6.94 (*)    Calcium 8.8 (*)    GFR calc non Af Amer 6 (*)    GFR calc Af Amer 7 (*)    All other components within normal limits  URINALYSIS COMPLETEWITH MICROSCOPIC ([facility] ONLY) - Abnormal; Notable for the following:    Color, Urine YELLOW (*)    APPearance CLOUDY (*)    Glucose, UA 150 (*)    Hgb urine dipstick 1+ (*)    pH 9.0 (*)    Protein, ur 100 (*)    Leukocytes, UA 3+ (*)    Squamous Epithelial / LPF 6-30 (*)    All other components within normal limits  URINE CULTURE   ____________________________________________   EKG    ____________________________________________    RADIOLOGY  CT abdomen and pelvis without contrast unremarkable.  ____________________________________________   PROCEDURES   ____________________________________________   INITIAL IMPRESSION / ASSESSMENT AND PLAN / ED COURSE  Pertinent labs & imaging results that were available during my care of the patient were reviewed by me and considered in my medical decision making (see chart for details).  Patient presents with subacute  left flank pain. It didn't interrupt her dialysis session today due to the severity of the pain, but she was able to complete 2 out of 3 hours of the session and her labs are unremarkable. We'll check a urinalysis as well as a noncontrast CT abdomen and pelvis to evaluate for any possible kidney stones. Since she's been on Keflex for a week, I'll plan to give fosfomycin and send a urine culture  ----------------------------------------- 7:09 PM on 07/27/2015 -----------------------------------------  CT unremarkable. Urinalysis highly consistent with urinary tract infection. No evidence of pyelonephritis or sepsis at this time. No pyocystis. We'll give fosfomycin here in the ED and have her continue the Keflex and follow-up with primary care and nephrology for further  monitoring.   ____________________________________________   FINAL CLINICAL IMPRESSION(S) / ED DIAGNOSES  Final diagnoses:  Cystitis      Carrie Mew, MD 07/27/15 1910

## 2015-07-29 LAB — URINE CULTURE

## 2015-08-05 ENCOUNTER — Emergency Department: Payer: Medicare Other

## 2015-08-05 ENCOUNTER — Encounter: Payer: Self-pay | Admitting: Emergency Medicine

## 2015-08-05 ENCOUNTER — Emergency Department
Admission: EM | Admit: 2015-08-05 | Discharge: 2015-08-05 | Disposition: A | Discharge status: Home / Self Care | Payer: Medicare Other | Attending: Emergency Medicine | Admitting: Emergency Medicine

## 2015-08-05 DIAGNOSIS — I120 Hypertensive chronic kidney disease with stage 5 chronic kidney disease or end stage renal disease: Secondary | ICD-10-CM | POA: Insufficient documentation

## 2015-08-05 DIAGNOSIS — N186 End stage renal disease: Secondary | ICD-10-CM | POA: Diagnosis not present

## 2015-08-05 DIAGNOSIS — Y998 Other external cause status: Secondary | ICD-10-CM | POA: Insufficient documentation

## 2015-08-05 DIAGNOSIS — S60211A Contusion of right wrist, initial encounter: Secondary | ICD-10-CM | POA: Insufficient documentation

## 2015-08-05 DIAGNOSIS — Z792 Long term (current) use of antibiotics: Secondary | ICD-10-CM | POA: Insufficient documentation

## 2015-08-05 DIAGNOSIS — Z7982 Long term (current) use of aspirin: Secondary | ICD-10-CM | POA: Insufficient documentation

## 2015-08-05 DIAGNOSIS — Z79899 Other long term (current) drug therapy: Secondary | ICD-10-CM | POA: Diagnosis not present

## 2015-08-05 DIAGNOSIS — Z7951 Long term (current) use of inhaled steroids: Secondary | ICD-10-CM | POA: Insufficient documentation

## 2015-08-05 DIAGNOSIS — Z7952 Long term (current) use of systemic steroids: Secondary | ICD-10-CM | POA: Insufficient documentation

## 2015-08-05 DIAGNOSIS — W208XXA Other cause of strike by thrown, projected or falling object, initial encounter: Secondary | ICD-10-CM | POA: Diagnosis not present

## 2015-08-05 DIAGNOSIS — Z87891 Personal history of nicotine dependence: Secondary | ICD-10-CM | POA: Insufficient documentation

## 2015-08-05 DIAGNOSIS — Z992 Dependence on renal dialysis: Secondary | ICD-10-CM | POA: Insufficient documentation

## 2015-08-05 DIAGNOSIS — Y9289 Other specified places as the place of occurrence of the external cause: Secondary | ICD-10-CM | POA: Insufficient documentation

## 2015-08-05 DIAGNOSIS — Y9389 Activity, other specified: Secondary | ICD-10-CM | POA: Insufficient documentation

## 2015-08-05 DIAGNOSIS — S6991XA Unspecified injury of right wrist, hand and finger(s), initial encounter: Secondary | ICD-10-CM | POA: Diagnosis present

## 2015-08-05 MED ORDER — ACETAMINOPHEN 500 MG PO TABS
1000.0000 mg | ORAL_TABLET | ORAL | Status: AC
  Administered 2015-08-05: 1000 mg via ORAL
  Filled 2015-08-05: qty 2

## 2015-08-05 NOTE — ED Notes (Addendum)
Pt to ed with c/o right wrist pain after dropping an object onto her right wrist yesterday.  Pt states pain is severe with palpation and movement of hand. +pulse, movement and sensation in right wrist. Pt reports today is her dialysis day.  BP at triage elevated to 204/105, pt states she took bp meds today.

## 2015-08-05 NOTE — Discharge Instructions (Signed)
Please make sure you go to your [facility] dialysis appointment today. This is very important. Your blood pressure is elevated, as we discussed he will be seen at dialysis which will likely improve your blood pressure and they will follow it closely.  Contusion A contusion is a deep bruise. Contusions are the result of an injury that caused bleeding under the skin. The contusion may turn blue, purple, or yellow. Minor injuries will give you a painless contusion, but more severe contusions may stay painful and swollen for a few weeks.  CAUSES  A contusion is usually caused by a blow, trauma, or direct force to an area of the body. SYMPTOMS   Swelling and redness of the injured area.  Bruising of the injured area.  Tenderness and soreness of the injured area.  Pain. DIAGNOSIS  The diagnosis can be made by taking a history and physical exam. An X-ray, CT scan, or MRI may be needed to determine if there were any associated injuries, such as fractures. TREATMENT  Specific treatment will depend on what area of the body was injured. In general, the best treatment for a contusion is resting, icing, elevating, and applying cold compresses to the injured area. Over-the-counter medicines may also be recommended for pain control. Ask your caregiver what the best treatment is for your contusion. HOME CARE INSTRUCTIONS   Put ice on the injured area.  Put ice in a plastic bag.  Place a towel between your skin and the bag.  Leave the ice on for 15-20 minutes, 3-4 times a day, or as directed by your health care provider.  Only take over-the-counter or prescription medicines for pain, discomfort, or fever as directed by your caregiver. Your caregiver may recommend avoiding anti-inflammatory medicines (aspirin, ibuprofen, and naproxen) for 48 hours because these medicines may increase bruising.  Rest the injured area.  If possible, elevate the injured area to reduce swelling. SEEK IMMEDIATE MEDICAL CARE  IF:   You have increased bruising or swelling.  You have pain that is getting worse.  Your swelling or pain is not relieved with medicines. MAKE SURE YOU:   Understand these instructions.  Will watch your condition.  Will get help right away if you are not doing well or get worse. Document Released: 08/22/2005 Document Revised: 11/17/2013 Document Reviewed: 09/17/2011 [facility] Patient Information 2015 [place], [place]. This information is not intended to replace advice given to you by your health care provider. Make sure you discuss any questions you have with your health care provider.

## 2015-08-05 NOTE — ED Provider Notes (Signed)
[facility] Emergency Department Provider Note REMINDER - THIS NOTE IS NOT A FINAL MEDICAL RECORD UNTIL IT IS SIGNED. UNTIL THEN, THE CONTENT BELOW MAY REFLECT INFORMATION FROM A DOCUMENTATION TEMPLATE, NOT THE ACTUAL PATIENT VISIT. ____________________________________________  Time seen: Approximately 10:35 AM  I have reviewed the triage vital signs and the nursing notes.   HISTORY  Chief Complaint Wrist Pain    HPI Michele Nash is a 59 y.o. female history of end-stage renal disease on dialysis, COPD, hypertension. Patient reports that yesterday she had a relatively small and light box fall and landed on her right wrist. She's been having pain and tenderness over the "bump" of her wrist. It is notable patient is pointing directly at her ulnar styloid. She denies any numbness or tingling. No weakness in the hand. No other injury.  Patient does report she has her dialysis, scheduled 11 AM today. She does note her blood pressure is high, and reports that this is addressed when she's had dialysis and she expects her blood pressure to come down after dialysis.  Aching pain in the right wrist. Worse with movement of the right wrist.  Past Medical History  Diagnosis Date  . Chronic kidney disease   . COPD (chronic obstructive pulmonary disease)   . Hypertension   . Hyperlipidemia   . Depression   . Dialysis patient 2014  . Obesity   . Bronchitis     Patient Active Problem List   Diagnosis Date Noted  . ESRD on hemodialysis 03/29/2015  . HTN (hypertension) 03/29/2015  . COPD (chronic obstructive pulmonary disease) 03/29/2015  . GAD (generalized anxiety disorder) 03/29/2015    Past Surgical History  Procedure Laterality Date  . Abdominal hysterectomy    . Dialysis fistula creation    . Colonoscopy  2011  . Peripheral vascular catheterization N/A 05/10/2015    Procedure: A/V Shuntogram/Fistulagram;  Surgeon: Katha Cabal, MD;  Location: [place] [facility];  Service: Cardiovascular;  Laterality: N/A;  . Peripheral vascular catheterization Left 05/10/2015    Procedure: A/V Shunt Intervention;  Surgeon: Katha Cabal, MD;  Location: [place] [facility];  Service: Cardiovascular;  Laterality: Left;    Current Outpatient Rx  Name  Route  Sig  Dispense  Refill  . albuterol (PROVENTIL HFA;VENTOLIN HFA) 108 (90 BASE) MCG/ACT inhaler   Inhalation   Inhale 2 puffs into the lungs every 6 (six) hours as needed for wheezing or shortness of breath.         . ALPRAZolam (XANAX) 0.5 MG tablet   Oral   Take 0.5 mg by mouth 2 (two) times daily.         Marland Kitchen amLODipine (NORVASC) 10 MG tablet   Oral   Take 1 tablet (10 mg total) by mouth daily.   30 tablet   1   . amLODipine (NORVASC) 5 MG tablet   Oral   Take 5 mg by mouth daily.         Marland Kitchen aspirin EC 81 MG EC tablet   Oral   Take 1 tablet (81 mg total) by mouth daily.   30 tablet   1   . calcium carbonate (OS-CAL) 600 MG TABS tablet   Oral   Take 600 mg by mouth 2 (two) times daily with a meal.         . carvedilol (COREG) 12.5 MG tablet   Oral   Take 12.5 mg by mouth daily.          Marland Kitchen  cinacalcet (SENSIPAR) 30 MG tablet   Oral   Take 30 mg by mouth daily.         . ciprofloxacin-dexamethasone (CIPRODEX) otic suspension   Left Ear   Place 4 drops into the left ear 2 (two) times daily.   7.5 mL   0   . clindamycin (CLEOCIN) 300 MG capsule   Oral   Take 1 capsule (300 mg total) by mouth 3 (three) times daily.   30 capsule   0   . ezetimibe (ZETIA) 10 MG tablet   Oral   Take 10 mg by mouth daily.         . fluticasone (VERAMYST) 27.5 MCG/SPRAY nasal spray   Nasal   Place 2 sprays into the nose daily.         . Fluticasone-Salmeterol (ADVAIR) 100-50 MCG/DOSE AEPB   Inhalation   Inhale 1 puff into the lungs 2 (two) times daily.         . furosemide (LASIX) 40 MG tablet   Oral   Take 40 mg by mouth.         . losartan (COZAAR) 100 MG  tablet   Oral   Take 100 mg by mouth daily.         . multivitamin (RENA-VIT) TABS tablet   Oral   Take 1 tablet by mouth daily.         Marland Kitchen omeprazole (PRILOSEC) 20 MG capsule   Oral   Take 20 mg by mouth daily.         Marland Kitchen oxyCODONE-acetaminophen (ROXICET) 5-325 MG per tablet   Oral   Take 1-2 tablets by mouth every 4 (four) hours as needed for severe pain.   15 tablet   0   . predniSONE (DELTASONE) 20 MG tablet   Oral   Take 2 tablets (40 mg total) by mouth daily.   10 tablet   0   . sertraline (ZOLOFT) 50 MG tablet   Oral   Take 50 mg by mouth daily.           Allergies Sulfa antibiotics  Family History  Problem Relation Age of Onset  . Heart disease Mother   . Cancer Father   . Cancer Sister     Social History Social History  Substance Use Topics  . Smoking status: Former Smoker -- 0.00 packs/day    Quit date: 11/23/2014  . Smokeless tobacco: Never Used  . Alcohol Use: No    Review of Systems Constitutional: No fever/chills Eyes: No visual changes. ENT: No sore throat. Cardiovascular: Denies chest pain. Respiratory: Denies shortness of breath. Gastrointestinal: No abdominal pain.  No nausea, no vomiting.  No diarrhea.  No constipation. Genitourinary: Negative for dysuria. Musculoskeletal: Negative for back pain. No pain in the hand. No pain in the elbow or shoulder. No other injury. Skin: Negative for rash. Neurological: Negative for headaches, focal weakness or numbness.  10-point ROS otherwise negative.  ____________________________________________   PHYSICAL EXAM:  VITAL SIGNS: ED Triage Vitals  Enc Vitals Group     BP 08/05/15 1025 204/105 mmHg     Pulse Rate 08/05/15 1025 78     Resp 08/05/15 1025 20     Temp 08/05/15 1025 98.6 F (37 C)     Temp Source 08/05/15 1025 Oral     SpO2 08/05/15 1025 96 %     Weight 08/05/15 1025 147 lb (66.679 kg)     Height 08/05/15 1025 5\' 5"  (1.651 m)  Head Cir --      Peak Flow --       Pain Score 08/05/15 1026 7     Pain Loc --      Pain Edu? --      Excl. in [place]? --    Constitutional: Alert and oriented. Well appearing and in no acute distress. Eyes: Conjunctivae are normal. PERRL. EOMI. Head: Atraumatic. Nose: No congestion/rhinnorhea. Mouth/Throat: Mucous membranes are moist.  Oropharynx non-erythematous. Neck: No stridor.   Cardiovascular: Normal rate, regular rhythm. Grossly normal heart sounds.  Good peripheral circulation. Respiratory: Normal respiratory effort.  No retractions. Lungs CTAB. Gastrointestinal: Soft and nontender. No distention. No abdominal bruits. No CVA tenderness. Musculoskeletal: No lower extremity tenderness nor edema.  No joint effusions.  Right shoulder and right upper arm normal. Right elbow normal range of motion. Right hand median ulnar and radial nerves intact. The right wrist shows no deformity. There is no tenderness of the snuffbox. He does have mild tenderness over the ulnar styloid without deformity or ecchymosis.  Neurologic:  Normal speech and language. No gross focal neurologic deficits are appreciated. No gait instability. Skin:  Skin is warm, dry and intact. No rash noted. Psychiatric: Mood and affect are normal. Speech and behavior are normal.  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 No acute fracture or dislocation. ____________________________________________   PROCEDURES  Procedure(s) performed: None  Critical Care performed: No  ____________________________________________   INITIAL IMPRESSION / ASSESSMENT AND PLAN / ED COURSE  Pertinent labs & imaging results that were available during  my care of the patient were reviewed by me and considered in my medical decision making (see chart for details).  Patient presents for evaluation of right wrist pain after having what she describes a fairly light box land on it. She denies any fevers or chills. There is no evidence of deformity. There is no normal neurovascular and muscular exam of the right wrist and hand. No other injury. We will obtain x-ray to evaluate and assure no bony injury, I suspect most likely contusion based on exam.  Patient also reports that she is to have dialysis today, she is calling [facility] to set up the appointment for later this day. She has no evidence of volume overload. Lungs are clear. She speaking in full sentences no distress. No chest pain or trouble breathing. She does have a significant elevated blood pressure, and discussed this with the patient but she reports that she generally has a very high blood pressure before she goes to dialysis and that this will calm down during the run. I think it is agreeable that it with the plan to go dialysis today it is reasonable that she could go with an elevated blood pressure at this time given the history she reports a previously elevated blood pressures and that she is going to dialysis today.  ----------------------------------------- 11:11 AM on 08/05/2015 -----------------------------------------  X-ray demonstrates no fracture. We'll discharge the patient. She will follow with dialysis at [facility] today. ____________________________________________   FINAL CLINICAL IMPRESSION(S) / ED DIAGNOSES  Final diagnoses:  Wrist contusion, right, initial encounter      Delman Kitten, MD 08/05/15 1111

## 2015-08-15 ENCOUNTER — Telehealth: Payer: Self-pay

## 2015-08-15 NOTE — Telephone Encounter (Signed)
-----   Message from [place], [place] sent at 07/26/2015  4:14 PM EDT ----- Regarding: Blood thinner Send order to stop Heparin at the dialysis clinic the week prior to colonoscopy. [facility] # [id number] fax# [phone]

## 2015-08-15 NOTE — Telephone Encounter (Signed)
Faxed request for pt to stop Heparin 1 week before her colonoscopy to [facility].

## 2015-08-22 ENCOUNTER — Other Ambulatory Visit: Payer: Self-pay | Admitting: Vascular Surgery

## 2015-08-23 ENCOUNTER — Ambulatory Visit
Admission: RE | Admit: 2015-08-23 | Discharge: 2015-08-23 | Disposition: A | Discharge status: Home / Self Care | Payer: Medicare Other | Source: Ambulatory Visit | Attending: Vascular Surgery | Admitting: Vascular Surgery

## 2015-08-23 ENCOUNTER — Encounter
Admission: RE | Disposition: A | Discharge status: Home / Self Care | Payer: Self-pay | Source: Ambulatory Visit | Attending: Vascular Surgery

## 2015-08-23 ENCOUNTER — Encounter: Payer: Self-pay | Admitting: *Deleted

## 2015-08-23 DIAGNOSIS — Z79899 Other long term (current) drug therapy: Secondary | ICD-10-CM | POA: Diagnosis not present

## 2015-08-23 DIAGNOSIS — Z992 Dependence on renal dialysis: Secondary | ICD-10-CM | POA: Diagnosis not present

## 2015-08-23 DIAGNOSIS — I499 Cardiac arrhythmia, unspecified: Secondary | ICD-10-CM | POA: Insufficient documentation

## 2015-08-23 DIAGNOSIS — Y832 Surgical operation with anastomosis, bypass or graft as the cause of abnormal reaction of the patient, or of later complication, without mention of misadventure at the time of the procedure: Secondary | ICD-10-CM | POA: Diagnosis not present

## 2015-08-23 DIAGNOSIS — T82848A Pain from vascular prosthetic devices, implants and grafts, initial encounter: Secondary | ICD-10-CM | POA: Insufficient documentation

## 2015-08-23 DIAGNOSIS — N186 End stage renal disease: Secondary | ICD-10-CM | POA: Diagnosis not present

## 2015-08-23 DIAGNOSIS — I120 Hypertensive chronic kidney disease with stage 5 chronic kidney disease or end stage renal disease: Secondary | ICD-10-CM | POA: Insufficient documentation

## 2015-08-23 DIAGNOSIS — F17200 Nicotine dependence, unspecified, uncomplicated: Secondary | ICD-10-CM | POA: Insufficient documentation

## 2015-08-23 DIAGNOSIS — J439 Emphysema, unspecified: Secondary | ICD-10-CM | POA: Insufficient documentation

## 2015-08-23 HISTORY — DX: Headache, unspecified: R51.9

## 2015-08-23 HISTORY — DX: Headache: R51

## 2015-08-23 HISTORY — PX: PERIPHERAL VASCULAR CATHETERIZATION: SHX172C

## 2015-08-23 SURGERY — A/V SHUNTOGRAM/FISTULAGRAM
Anesthesia: Moderate Sedation

## 2015-08-23 MED ORDER — FENTANYL CITRATE (PF) 100 MCG/2ML IJ SOLN
INTRAMUSCULAR | Status: DC | PRN
  Administered 2015-08-23: 25 ug via INTRAVENOUS
  Administered 2015-08-23: 100 ug via INTRAVENOUS

## 2015-08-23 MED ORDER — MIDAZOLAM HCL 2 MG/2ML IJ SOLN
INTRAMUSCULAR | Status: DC | PRN
  Administered 2015-08-23: 2 mg via INTRAVENOUS
  Administered 2015-08-23: 0.5 mg via INTRAVENOUS

## 2015-08-23 MED ORDER — LIDOCAINE-EPINEPHRINE (PF) 1 %-1:200000 IJ SOLN
INTRAMUSCULAR | Status: AC
  Filled 2015-08-23: qty 30

## 2015-08-23 MED ORDER — CEFUROXIME SODIUM 1.5 G IJ SOLR
INTRAMUSCULAR | Status: AC
Start: 2015-08-23 — End: 2015-08-24
  Filled 2015-08-23: qty 1.5

## 2015-08-23 MED ORDER — HYDRALAZINE HCL 20 MG/ML IJ SOLN
INTRAMUSCULAR | Status: AC
  Administered 2015-08-23: 10 mg via INTRAVENOUS
  Filled 2015-08-23: qty 1

## 2015-08-23 MED ORDER — HYDRALAZINE HCL 20 MG/ML IJ SOLN
10.0000 mg | Freq: Once | INTRAMUSCULAR | Status: AC
  Administered 2015-08-23: 10 mg via INTRAVENOUS

## 2015-08-23 MED ORDER — FENTANYL CITRATE (PF) 100 MCG/2ML IJ SOLN
INTRAMUSCULAR | Status: AC
  Filled 2015-08-23: qty 2

## 2015-08-23 MED ORDER — LABETALOL HCL 5 MG/ML IV SOLN
INTRAVENOUS | Status: AC
  Administered 2015-08-23: 5 mg via INTRAVENOUS
  Filled 2015-08-23: qty 4

## 2015-08-23 MED ORDER — IOHEXOL 300 MG/ML  SOLN
INTRAMUSCULAR | Status: DC | PRN
Start: 2015-08-23 — End: 2015-08-23
  Administered 2015-08-23: 60 mL via INTRAVENOUS

## 2015-08-23 MED ORDER — MIDAZOLAM HCL 2 MG/2ML IJ SOLN
INTRAMUSCULAR | Status: AC
  Filled 2015-08-23: qty 2

## 2015-08-23 MED ORDER — HEPARIN SODIUM (PORCINE) 10000 UNIT/ML IJ SOLN
INTRAMUSCULAR | Status: AC
  Filled 2015-08-23: qty 1

## 2015-08-23 MED ORDER — LABETALOL HCL 5 MG/ML IV SOLN
20.0000 mg | Freq: Once | INTRAVENOUS | Status: AC
  Administered 2015-08-23: 5 mg via INTRAVENOUS

## 2015-08-23 MED ORDER — SODIUM CHLORIDE 0.9 % IV SOLN
INTRAVENOUS | Status: DC
  Administered 2015-08-23: 13:00:00 via INTRAVENOUS

## 2015-08-23 MED ORDER — CHLORHEXIDINE GLUCONATE CLOTH 2 % EX PADS: 6.0000 | MEDICATED_PAD | Freq: Once | CUTANEOUS | Status: DC

## 2015-08-23 MED ORDER — DEXTROSE 5 % IV SOLN: 1.5000 g | INTRAVENOUS | Status: DC

## 2015-08-23 MED ORDER — HEPARIN (PORCINE) IN NACL 2-0.9 UNIT/ML-% IJ SOLN
INTRAMUSCULAR | Status: AC
  Filled 2015-08-23: qty 1000

## 2015-08-23 MED ORDER — LIDOCAINE-EPINEPHRINE 1 %-1:100000 IJ SOLN
INTRAMUSCULAR | Status: DC | PRN
  Administered 2015-08-23: 20 mL

## 2015-08-23 SURGICAL SUPPLY — 13 items
CATH 5F KA2 (CATHETERS) ×5 IMPLANT
CATH PALINDROME RT-P 15FX19CM (CATHETERS) ×5 IMPLANT
DERMABOND ADVANCED (GAUZE/BANDAGES/DRESSINGS) ×2
DERMABOND ADVANCED .7 DNX12 (GAUZE/BANDAGES/DRESSINGS) ×3 IMPLANT
DRAPE BRACHIAL (DRAPES) ×5 IMPLANT
DRAPE INCISE IOBAN 66X45 STRL (DRAPES) ×5 IMPLANT
PACK ANGIOGRAPHY (CUSTOM PROCEDURE TRAY) ×5 IMPLANT
PREP CHG 10.5 TEAL (MISCELLANEOUS) ×5 IMPLANT
SET INTRO CAPELLA COAXIAL (SET/KITS/TRAYS/PACK) ×5 IMPLANT
SHEATH BRITE TIP 6FRX5.5 (SHEATH) ×5 IMPLANT
SUT MNCRL AB 4-0 PS2 18 (SUTURE) ×5 IMPLANT
SUT SILK 0 FSL (SUTURE) ×5 IMPLANT
TOWEL OR 17X26 4PK STRL BLUE (TOWEL DISPOSABLE) ×5 IMPLANT

## 2015-08-23 NOTE — Discharge Instructions (Signed)
The drugs you were given will stay in your system until tomorrow, so for the next 24 hours you should not.  Drive an automobile. Make any legal decisions.  Drink any alcoholic beverages.  Today you should start with liquids and gradually work up to solid foods as your are able to tolerate them  Resume your regular medications as prescribed by your doctor.  Avoid aspirin for 24 hours.    Change the Band-Aid or dressing as needed.  After a 2 days no dressing as needed.  Avoid strenuous activity for the remainder of the day.  Please notify your primary physician immediately if you have any unusual bleeding, trouble breathing, fever >100 degrees or pain not relieved by the medication your doctor prescribed for your doctor prescribed for you physician  Return to [place].  Notify doctor for signs of bleeding or infection such; bleeding, swelling, redness, pain not relieved by tylenol, cloudy or yellow drainage and fevers  You may take tylenol for discomfort at the procedure site.

## 2015-08-23 NOTE — Op Note (Signed)
OPERATIVE NOTE   PROCEDURE: 1. Insertion of tunneled dialysis catheter right IJ approach. 2. Contrast injection of left arm AV fistula 3. Contrast injection left arm brachial and axillary veins first order catheter placement  PRE-OPERATIVE DIAGNOSIS: Complication of dialysis access with severe arm pain and aneurysmal deterioration of the AV fistula; end stage renal disease requiring hemodialysis  POST-OPERATIVE DIAGNOSIS: Same  SURGEON: Schnier, Dolores Lory.  ANESTHESIA: Conscious sedation with 1% lidocaine local infiltration  ESTIMATED BLOOD LOSS: Minimal cc  CONTRAST USED:  None  FLUOROSCOPY TIME:    INDICATIONS:   Michele Nash a 59 y.o. y.o. 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 Patient states she can no longer tolerate extreme pain and is asking for a catheter placement. Discussion regarding revision of her existing access was then undertaken and she has agreed to placement of the catheter with evaluation of her fistula for planning a revision of her left arm AV access. The risks and benefits were reviewed all questions are answered patient has agreed to proceed.  DESCRIPTION: After obtaining full informed written consent, the patient was positioned supine. The right neck was prepped and draped in a sterile fashion. Ultrasound was placed in a sterile sleeve. Ultrasound was utilized to identify the right internal jugular vein which is noted to be echolucent and compressible indicating patency. Images recorded for the permanent record. Under real-time visualization a Seldinger needle is inserted into the vein and the guidewires advanced without difficulty. Small counterincision was made at the wire insertion site. Dilators are passed over the wire and the tunneled dialysis catheter is fed into the central venous system without difficulty.  Under fluoroscopy the catheter tip  positioned at the atrial caval junction. The catheter is then approximated to the chest wall and an exit site selected. 1% lidocaine is infiltrated in soft tissues at this level small incision is made and the tunneling device is then passed from the exit site to the neck counterincision. Catheter is then connected to the tunneling device and the catheter was pulled subcutaneously. It is then transected and the hub assembly connected without difficulty. Both lumens aspirate and flush easily. After verification of smooth contour with proper tip position under fluoroscopy the catheter is packed with 5000 units of heparin per lumen.  Catheter secured to the skin of the right chest with 0 silk. A sterile dressing is applied with a Biopatch.  The patient was then repositioned with her left arm extended [facility] port in the left arm was prepped and draped in a sterile fashion. 1% lidocaine was infiltrated in soft tissues overlying the AV fistula near the arterial anastomosis and an antegrade direction a microneedle was inserted. Microwire followed by micro-sheath J-wire followed by 6 French sheath. Hand injection contrast through the 6 French sheath was then used to demonstrate the fistula as well as the central veins. Once this anatomy had been reviewed a floppy Glidewire and KMP catheter were negotiated through the fistula and to the level of the subclavian cephalic confluence. The wire and catheter with then steered in a retrograde direction down the left axillary vein and into the more distal brachial vein. Venography of the left arm was then obtained through this catheter placement demonstrating patency of the brachial veins as well as moderate narrowing of the axillary vein. After review these images the catheters pulled pursestring sutures placed around the sheath and the sheath is removed.  Interpretation: The  fistula is patent however there are multiple very large 4 cm aneurysms associated with the area of  cannulation. More proximally the cephalic vein is quite tortuous however the subclavian cephalic confluence seems to be patent. Central veins are all widely patent. With the catheter negotiated into the brachial veins of the left arm in a retrograde direction this demonstrates patency of the brachial veins and a moderate 30-40% smooth narrowing of the true axillary vein. Of note the subclavian cephalic confluence does seem to be widely patent.  Therefore the patient does have possibility of revision of the left arm AV fistula either in a jump graft from the anastomotic area to the more proximal vein at the level of the deltoid or using the existing anastomosis and creating a brachial axillary graft. I would favor the first choice as a brachial axillary graftbe created in the future whereas the vice versa is not true.    COMPLICATIONS: None   CONDITION: Good   Schnier, Dolores Lory [place] [facility]. Office:  [phone]   08/23/2015,2:34 PM

## 2015-08-24 ENCOUNTER — Encounter: Payer: Self-pay | Admitting: Vascular Surgery

## 2015-08-24 ENCOUNTER — Emergency Department
Admission: EM | Admit: 2015-08-24 | Discharge: 2015-08-24 | Disposition: A | Discharge status: Home / Self Care | Payer: Medicare Other | Attending: Emergency Medicine | Admitting: Emergency Medicine

## 2015-08-24 ENCOUNTER — Ambulatory Visit: Payer: Medicare Other

## 2015-08-24 DIAGNOSIS — Z7982 Long term (current) use of aspirin: Secondary | ICD-10-CM | POA: Insufficient documentation

## 2015-08-24 DIAGNOSIS — I120 Hypertensive chronic kidney disease with stage 5 chronic kidney disease or end stage renal disease: Secondary | ICD-10-CM | POA: Diagnosis not present

## 2015-08-24 DIAGNOSIS — Z79899 Other long term (current) drug therapy: Secondary | ICD-10-CM | POA: Diagnosis not present

## 2015-08-24 DIAGNOSIS — Y841 Kidney dialysis as the cause of abnormal reaction of the patient, or of later complication, without mention of misadventure at the time of the procedure: Secondary | ICD-10-CM | POA: Insufficient documentation

## 2015-08-24 DIAGNOSIS — Z7952 Long term (current) use of systemic steroids: Secondary | ICD-10-CM | POA: Diagnosis not present

## 2015-08-24 DIAGNOSIS — N186 End stage renal disease: Secondary | ICD-10-CM | POA: Diagnosis not present

## 2015-08-24 DIAGNOSIS — G8918 Other acute postprocedural pain: Secondary | ICD-10-CM | POA: Diagnosis present

## 2015-08-24 DIAGNOSIS — Z792 Long term (current) use of antibiotics: Secondary | ICD-10-CM | POA: Diagnosis not present

## 2015-08-24 DIAGNOSIS — Z7951 Long term (current) use of inhaled steroids: Secondary | ICD-10-CM | POA: Insufficient documentation

## 2015-08-24 DIAGNOSIS — Z87891 Personal history of nicotine dependence: Secondary | ICD-10-CM | POA: Diagnosis not present

## 2015-08-24 DIAGNOSIS — T82838A Hemorrhage of vascular prosthetic devices, implants and grafts, initial encounter: Secondary | ICD-10-CM | POA: Diagnosis not present

## 2015-08-24 NOTE — ED Notes (Signed)
Pt sent from dialysis with reports that permacath to rt chest is bleeding from site. Pt had permacath placed yesterday and it was used today to complete dialysis.

## 2015-08-24 NOTE — ED Provider Notes (Signed)
[facility] Emergency Department Provider Note   ____________________________________________  Time seen: 1735  I have reviewed the triage vital signs and the nursing notes.   HISTORY  Chief Complaint Post-op Problem   History limited by: Not Limited   HPI Michele Nash is a 59 y.o. female who presents to the emergency department today because of concerns for leading from her vascular access catheter. Patient states that she had the catheter placed in her right upper chest yesterday. She states that at dialysis today it was accessed and use for the first time. She states she was able to get her normal course of dialysis without any problems. She states that when she was driving home she felt wetness in her bra. She noticed that she was bleeding from that site. The patient then went back to the dialysis center. At the dialysis center they gave the patient gauze in a glove and told her to apply pressure and good the emergency department. The patient has had some associated pain around the site where the catheter was placed since yesterday.  Past Medical History  Diagnosis Date  . Chronic kidney disease   . COPD (chronic obstructive pulmonary disease)   . Hypertension   . Hyperlipidemia   . Depression   . Dialysis patient 2014  . Obesity   . Bronchitis   . Headache     Patient Active Problem List   Diagnosis Date Noted  . ESRD on hemodialysis 03/29/2015  . HTN (hypertension) 03/29/2015  . COPD (chronic obstructive pulmonary disease) 03/29/2015  . GAD (generalized anxiety disorder) 03/29/2015    Past Surgical History  Procedure Laterality Date  . Abdominal hysterectomy    . Dialysis fistula creation    . Colonoscopy  2011  . Peripheral vascular catheterization N/A 05/10/2015    Procedure: A/V Shuntogram/Fistulagram;  Surgeon: Katha Cabal, MD;  Location: [place] [facility];  Service: Cardiovascular;  Laterality: N/A;  . Peripheral vascular  catheterization Left 05/10/2015    Procedure: A/V Shunt Intervention;  Surgeon: Katha Cabal, MD;  Location: [place] [facility];  Service: Cardiovascular;  Laterality: Left;  . Peripheral vascular catheterization Left 08/23/2015    Procedure: A/V Shuntogram/Fistulagram;  Surgeon: Katha Cabal, MD;  Location: [place] [facility];  Service: Cardiovascular;  Laterality: Left;  . Peripheral vascular catheterization N/A 08/23/2015    Procedure: A/V Shunt Intervention;  Surgeon: Katha Cabal, MD;  Location: [place] [facility];  Service: Cardiovascular;  Laterality: N/A;  . Peripheral vascular catheterization  08/23/2015    Procedure: Dialysis/Perma Catheter Insertion;  Surgeon: Katha Cabal, MD;  Location: [place] [facility];  Service: Cardiovascular;;    Current Outpatient Rx  Name  Route  Sig  Dispense  Refill  . albuterol (PROVENTIL HFA;VENTOLIN HFA) 108 (90 BASE) MCG/ACT inhaler   Inhalation   Inhale 2 puffs into the lungs every 6 (six) hours as needed for wheezing or shortness of breath.         . ALPRAZolam (XANAX) 0.5 MG tablet   Oral   Take 0.5 mg by mouth 2 (two) times daily.         Marland Kitchen amLODipine (NORVASC) 10 MG tablet   Oral   Take 1 tablet (10 mg total) by mouth daily.   30 tablet   1   . amLODipine (NORVASC) 5 MG tablet   Oral   Take 5 mg by mouth daily.         Marland Kitchen aspirin  EC 81 MG EC tablet   Oral   Take 1 tablet (81 mg total) by mouth daily. Patient not taking: Reported on 08/23/2015   30 tablet   1   . calcium carbonate (OS-CAL) 600 MG TABS tablet   Oral   Take 600 mg by mouth 2 (two) times daily with a meal.         . carvedilol (COREG) 12.5 MG tablet   Oral   Take 12.5 mg by mouth daily.          . cinacalcet (SENSIPAR) 30 MG tablet   Oral   Take 30 mg by mouth daily.         . ciprofloxacin-dexamethasone (CIPRODEX) otic suspension   Left Ear   Place 4 drops into the left ear 2 (two) times daily. Patient not taking:  Reported on 08/23/2015   7.5 mL   0   . clindamycin (CLEOCIN) 300 MG capsule   Oral   Take 1 capsule (300 mg total) by mouth 3 (three) times daily. Patient not taking: Reported on 08/23/2015   30 capsule   0   . ezetimibe (ZETIA) 10 MG tablet   Oral   Take 10 mg by mouth daily.         . fluticasone (VERAMYST) 27.5 MCG/SPRAY nasal spray   Nasal   Place 2 sprays into the nose daily.         . Fluticasone-Salmeterol (ADVAIR) 100-50 MCG/DOSE AEPB   Inhalation   Inhale 1 puff into the lungs 2 (two) times daily.         . furosemide (LASIX) 40 MG tablet   Oral   Take 40 mg by mouth.         . losartan (COZAAR) 100 MG tablet   Oral   Take 100 mg by mouth. On non-dialysis days         . multivitamin (RENA-VIT) TABS tablet   Oral   Take 1 tablet by mouth daily.         Marland Kitchen omeprazole (PRILOSEC) 20 MG capsule   Oral   Take 20 mg by mouth daily.         Marland Kitchen oxyCODONE-acetaminophen (ROXICET) 5-325 MG per tablet   Oral   Take 1-2 tablets by mouth every 4 (four) hours as needed for severe pain. Patient not taking: Reported on 08/23/2015   15 tablet   0   . predniSONE (DELTASONE) 20 MG tablet   Oral   Take 2 tablets (40 mg total) by mouth daily. Patient not taking: Reported on 08/23/2015   10 tablet   0   . sertraline (ZOLOFT) 50 MG tablet   Oral   Take 50 mg by mouth daily.           Allergies Sulfa antibiotics  Family History  Problem Relation Age of Onset  . Heart disease Mother   . Cancer Father   . Cancer Sister     Social History Social History  Substance Use Topics  . Smoking status: Former Smoker -- 0.00 packs/day    Quit date: 11/23/2014  . Smokeless tobacco: Never Used  . Alcohol Use: No    Review of Systems  Constitutional: Negative for fever. Cardiovascular: Negative for chest pain. Respiratory: Negative for shortness of breath. Gastrointestinal: Negative for abdominal pain, vomiting and diarrhea. Genitourinary: Negative for  dysuria. Musculoskeletal: Negative for back pain. Skin: Negative for rash. Neurological: Negative for headaches, focal weakness or numbness.  10-point ROS otherwise negative.  ____________________________________________   PHYSICAL EXAM:  VITAL SIGNS: ED Triage Vitals  Enc Vitals Group     BP 08/24/15 1703 135/98 mmHg     Pulse Rate 08/24/15 1703 74     Resp 08/24/15 1703 20     Temp 08/24/15 1703 98.4 F (36.9 C)     Temp Source 08/24/15 1703 Oral     SpO2 08/24/15 1703 96 %     Weight 08/24/15 1703 147 lb (66.679 kg)     Height 08/24/15 1703 5\' 5"  (1.651 m)     Head Cir --      Peak Flow --      Pain Score 08/24/15 1704 9   Constitutional: Alert and oriented. Well appearing and in no distress. Eyes: Conjunctivae are normal. PERRL. Normal extraocular movements. ENT   Head: Normocephalic and atraumatic.   Nose: No congestion/rhinnorhea.   Mouth/Throat: Mucous membranes are moist.   Neck: No stridor. Hematological/Lymphatic/Immunilogical: No cervical lymphadenopathy. Cardiovascular: Normal rate, regular rhythm.  No murmurs, rubs, or gallops. Respiratory: Normal respiratory effort without tachypnea nor retractions. Breath sounds are clear and equal bilaterally. No wheezes/rales/rhonchi. Gastrointestinal: Soft and nontender. No distention.  Genitourinary: Deferred Musculoskeletal: Normal range of motion in all extremities.  AV fistula to left lower arm. Neurologic:  Normal speech and language. No gross focal neurologic deficits are appreciated. Speech is normal.  Skin:  Skin is warm, dry and intact. Vascular catheter in place in the right upper chest. Small amount of bleeding from site. Tender to palpation in that area. Some associated ecchymosis. Psychiatric: Mood and affect are normal. Speech and behavior are normal. Patient exhibits appropriate insight and judgment.  ____________________________________________    LABS (pertinent  positives/negatives)  None  ____________________________________________   EKG  None  ____________________________________________    RADIOLOGY  None   ____________________________________________   PROCEDURES  Procedure(s) performed: None  Critical Care performed: No  ____________________________________________   INITIAL IMPRESSION / ASSESSMENT AND PLAN / ED COURSE  Pertinent labs & imaging results that were available during my care of the patient were reviewed by me and considered in my medical decision making (see chart for details).  Patient presented to the emergency department today with concerns for bleeding from her vascular catheter site in her right chest. On exam patient had a small amount of bleeding coming from the site. This was redressed. Checking on the bandage a short time later there was a small amount of blood on the gauze. Does not appear that the bleeding has gone past the dressing short detector. This point patient's vitals remained stable. Think patient is certainly safe for discharge. Instructed patient did touch base with Dr. Delana Meyer tomorrow.  ____________________________________________   FINAL CLINICAL IMPRESSION(S) / ED DIAGNOSES  Final diagnoses:  Bleeding due to dialysis catheter placement, initial encounter     Nance Pear, MD 08/24/15 1910

## 2015-08-24 NOTE — Discharge Instructions (Signed)
Please seek medical attention for any high fevers, chest pain, shortness of breath, change in behavior, persistent vomiting, bloody stool or any other new or concerning symptoms.   Dialysis Vascular Access Malfunction A vascular access is an entrance to your blood vessels that can be used for dialysis. A vascular access can be made in one of several ways:   Joining an artery to a vein under your skin to make a bigger blood vessel called a fistula.   Joining an artery to a vein under your skin using a soft tube called a graft.   Placing a thin, flexible tube (catheter) in a large vein, usually in your neck.  A vascular access may malfunction or become blocked.  WHAT CAN CAUSE YOUR VASCULAR ACCESS TO MALFUNCTION?  Infection (common).   A blood clot inside a part of the fistula, graft, or catheter. A blood clot can completely or partially block the flow of blood.   A kink in the graft or catheter.   A collection of blood (called a hematoma or bruise) next to the graft or catheter that pushes against it, blocking the flow of blood.  WHAT ARE SIGNS AND SYMPTOMS OF VASCULAR ACCESS MALFUNCTION?  There is a change in the vibration or pulse of your fistula or graft.  The vibration or pulse of your fistula or graft is gone.   There is new or unusual swelling of the area around the access.   There was an unsuccessful puncture of your access by the dialysis team.   The flow of blood through the fistula, graft, or catheter is too slow for effective dialysis.   When routine dialysis is completed and the needle is removed, bleeding lasts for too long a time.  WHAT HAPPENS IF MY VASCULAR ACCESS MALFUNCTIONS? Your health care provider may order blood work, cultures, or an X-ray test in order to learn what may be wrong with your vascular access. The X-ray test involves the injection of a liquid into the vascular access. The liquid shows up on the X-ray and allows your health care  provider to see if there is a blockage in the vascular access.  Treatment varies depending on the cause of the malfunction:   If the vascular access is infected, your health care provider may prescribe antibiotic medicine to control the infection.   If a clot is found in the vascular access, you may need surgery to remove the clot.   If a blockage in the vascular access is due to some other cause (such as a kink in a graft), then you will likely need surgery to unblock or replace the graft.  HOME CARE INSTRUCTIONS: Follow up with your surgeon or other health care provider if you were instructed to do so. This is very important. Any delay in follow-up could cause permanent dysfunction of the vascular access, which may be dangerous.  SEEK MEDICAL CARE IF:   Fever develops.   Swelling and pain around the vascular access gets worse or new pain develops.  Pain, numbness, or an unusual pale skin color develops in the hand on the side of your vascular access. SEEK IMMEDIATE MEDICAL CARE IF: Unusual bleeding develops at the location of the vascular access. MAKE SURE YOU:  Understand these instructions.  Will watch your condition.  Will get help right away if you are not doing well or get worse. Document Released: 10/15/2006 Document Revised: 03/29/2014 Document Reviewed: 04/16/2013 [facility] Patient Information 2015 [place], [place]. This information is not intended to  replace advice given to you by your health care provider. Make sure you discuss any questions you have with your health care provider. ° °

## 2015-08-24 NOTE — ED Notes (Signed)
Dr. Archie Balboa at bedside, old dressing removed from left chest vascular catheter.  Dr. Archie Balboa assessed site and applied surgi-seal to oozing insertion site, sterile guaze applied over catheter, and new sterile dressing applied.

## 2015-08-24 NOTE — ED Notes (Signed)
Right chest wall vascular access dressing with blood on guaze, dressing intact.

## 2015-08-24 NOTE — ED Notes (Addendum)
Per patient she finished dialysis at 15:30 and on way home she felt something wet in shirt.  Patient had her husband take her back to dialysis. Dialysis nurse gave patient a glove to place on her hand and guaze to apply pressure and reccommended that she come to ER.  Patient presents with controlled bleeding to right chest hemodialysis double lumen vascular catheter.

## 2015-08-25 ENCOUNTER — Emergency Department
Admission: EM | Admit: 2015-08-25 | Discharge: 2015-08-25 | Disposition: A | Discharge status: Home / Self Care | Payer: Medicare Other | Attending: Emergency Medicine | Admitting: Emergency Medicine

## 2015-08-25 ENCOUNTER — Encounter: Payer: Self-pay | Admitting: Emergency Medicine

## 2015-08-25 DIAGNOSIS — I120 Hypertensive chronic kidney disease with stage 5 chronic kidney disease or end stage renal disease: Secondary | ICD-10-CM | POA: Insufficient documentation

## 2015-08-25 DIAGNOSIS — Z79899 Other long term (current) drug therapy: Secondary | ICD-10-CM | POA: Diagnosis not present

## 2015-08-25 DIAGNOSIS — Z87891 Personal history of nicotine dependence: Secondary | ICD-10-CM | POA: Diagnosis not present

## 2015-08-25 DIAGNOSIS — Z992 Dependence on renal dialysis: Secondary | ICD-10-CM | POA: Insufficient documentation

## 2015-08-25 DIAGNOSIS — T82838D Hemorrhage of vascular prosthetic devices, implants and grafts, subsequent encounter: Secondary | ICD-10-CM | POA: Diagnosis present

## 2015-08-25 DIAGNOSIS — N186 End stage renal disease: Secondary | ICD-10-CM | POA: Insufficient documentation

## 2015-08-25 DIAGNOSIS — Z7951 Long term (current) use of inhaled steroids: Secondary | ICD-10-CM | POA: Insufficient documentation

## 2015-08-25 DIAGNOSIS — Z7982 Long term (current) use of aspirin: Secondary | ICD-10-CM | POA: Insufficient documentation

## 2015-08-25 DIAGNOSIS — Y834 Other reconstructive surgery as the cause of abnormal reaction of the patient, or of later complication, without mention of misadventure at the time of the procedure: Secondary | ICD-10-CM | POA: Insufficient documentation

## 2015-08-25 DIAGNOSIS — Z792 Long term (current) use of antibiotics: Secondary | ICD-10-CM | POA: Diagnosis not present

## 2015-08-25 MED ORDER — OXYCODONE-ACETAMINOPHEN 5-325 MG PO TABS
1.0000 | ORAL_TABLET | Freq: Once | ORAL | Status: AC
Start: 2015-08-25 — End: 2015-08-25
  Administered 2015-08-25: 1 via ORAL
  Filled 2015-08-25: qty 1

## 2015-08-25 MED ORDER — OXYCODONE-ACETAMINOPHEN 5-325 MG PO TABS
ORAL_TABLET | ORAL | Status: AC
  Administered 2015-08-25: 1 via ORAL
  Filled 2015-08-25: qty 1

## 2015-08-25 MED ORDER — OXYCODONE-ACETAMINOPHEN 5-325 MG PO TABS
1.0000 | ORAL_TABLET | Freq: Once | ORAL | Status: AC
Start: 2015-08-25 — End: 2015-08-25
  Administered 2015-08-25 (×2): 1 via ORAL

## 2015-08-25 NOTE — ED Notes (Signed)
MD at bedside for reeval

## 2015-08-25 NOTE — ED Provider Notes (Signed)
[facility] Emergency Department Provider Note  ____________________________________________  Time seen: 1:45 AM  I have reviewed the triage vital signs and the nursing notes.   HISTORY  Chief Complaint Post-op Problem      HPI Michele Nash is a 59 y.o. female presents to the ED with continued bleeding from her dialysis catheter site right upper chest. Patient states that she's noticed bleeding from the catheter site after dialysis today. Patient states that the catheter was placed approximately 2 weeks ago by Dr. Delana Meyer. Patient was seen early in the emergency department for the same. Of note patient's dialysis was yesterday    Past Medical History  Diagnosis Date  . Chronic kidney disease   . COPD (chronic obstructive pulmonary disease)   . Hypertension   . Hyperlipidemia   . Depression   . Dialysis patient 2014  . Obesity   . Bronchitis   . Headache     Patient Active Problem List   Diagnosis Date Noted  . ESRD on hemodialysis 03/29/2015  . HTN (hypertension) 03/29/2015  . COPD (chronic obstructive pulmonary disease) 03/29/2015  . GAD (generalized anxiety disorder) 03/29/2015    Past Surgical History  Procedure Laterality Date  . Abdominal hysterectomy    . Dialysis fistula creation    . Colonoscopy  2011  . Peripheral vascular catheterization N/A 05/10/2015    Procedure: A/V Shuntogram/Fistulagram;  Surgeon: Katha Cabal, MD;  Location: [place] [facility];  Service: Cardiovascular;  Laterality: N/A;  . Peripheral vascular catheterization Left 05/10/2015    Procedure: A/V Shunt Intervention;  Surgeon: Katha Cabal, MD;  Location: [place] [facility];  Service: Cardiovascular;  Laterality: Left;  . Peripheral vascular catheterization Left 08/23/2015    Procedure: A/V Shuntogram/Fistulagram;  Surgeon: Katha Cabal, MD;  Location: [place] [facility];  Service: Cardiovascular;  Laterality: Left;  . Peripheral vascular  catheterization N/A 08/23/2015    Procedure: A/V Shunt Intervention;  Surgeon: Katha Cabal, MD;  Location: [place] [facility];  Service: Cardiovascular;  Laterality: N/A;  . Peripheral vascular catheterization  08/23/2015    Procedure: Dialysis/Perma Catheter Insertion;  Surgeon: Katha Cabal, MD;  Location: [place] [facility];  Service: Cardiovascular;;    Current Outpatient Rx  Name  Route  Sig  Dispense  Refill  . albuterol (PROVENTIL HFA;VENTOLIN HFA) 108 (90 BASE) MCG/ACT inhaler   Inhalation   Inhale 2 puffs into the lungs every 6 (six) hours as needed for wheezing or shortness of breath.         . ALPRAZolam (XANAX) 0.5 MG tablet   Oral   Take 0.5 mg by mouth 2 (two) times daily.         Marland Kitchen amLODipine (NORVASC) 10 MG tablet   Oral   Take 1 tablet (10 mg total) by mouth daily.   30 tablet   1   . amLODipine (NORVASC) 5 MG tablet   Oral   Take 5 mg by mouth daily.         Marland Kitchen aspirin EC 81 MG EC tablet   Oral   Take 1 tablet (81 mg total) by mouth daily. Patient not taking: Reported on 08/23/2015   30 tablet   1   . calcium carbonate (OS-CAL) 600 MG TABS tablet   Oral   Take 600 mg by mouth 2 (two) times daily with a meal.         . carvedilol (COREG) 12.5 MG tablet   Oral  Take 12.5 mg by mouth daily.          . cinacalcet (SENSIPAR) 30 MG tablet   Oral   Take 30 mg by mouth daily.         . ciprofloxacin-dexamethasone (CIPRODEX) otic suspension   Left Ear   Place 4 drops into the left ear 2 (two) times daily. Patient not taking: Reported on 08/23/2015   7.5 mL   0   . clindamycin (CLEOCIN) 300 MG capsule   Oral   Take 1 capsule (300 mg total) by mouth 3 (three) times daily. Patient not taking: Reported on 08/23/2015   30 capsule   0   . ezetimibe (ZETIA) 10 MG tablet   Oral   Take 10 mg by mouth daily.         . fluticasone (VERAMYST) 27.5 MCG/SPRAY nasal spray   Nasal   Place 2 sprays into the nose daily.         .  Fluticasone-Salmeterol (ADVAIR) 100-50 MCG/DOSE AEPB   Inhalation   Inhale 1 puff into the lungs 2 (two) times daily.         . furosemide (LASIX) 40 MG tablet   Oral   Take 40 mg by mouth.         . losartan (COZAAR) 100 MG tablet   Oral   Take 100 mg by mouth. On non-dialysis days         . multivitamin (RENA-VIT) TABS tablet   Oral   Take 1 tablet by mouth daily.         Marland Kitchen omeprazole (PRILOSEC) 20 MG capsule   Oral   Take 20 mg by mouth daily.         Marland Kitchen oxyCODONE-acetaminophen (ROXICET) 5-325 MG per tablet   Oral   Take 1-2 tablets by mouth every 4 (four) hours as needed for severe pain. Patient not taking: Reported on 08/23/2015   15 tablet   0   . predniSONE (DELTASONE) 20 MG tablet   Oral   Take 2 tablets (40 mg total) by mouth daily. Patient not taking: Reported on 08/23/2015   10 tablet   0   . sertraline (ZOLOFT) 50 MG tablet   Oral   Take 50 mg by mouth daily.           Allergies Sulfa antibiotics  Family History  Problem Relation Age of Onset  . Heart disease Mother   . Cancer Father   . Cancer Sister     Social History Social History  Substance Use Topics  . Smoking status: Former Smoker -- 0.00 packs/day    Quit date: 11/23/2014  . Smokeless tobacco: Never Used  . Alcohol Use: No    Review of Systems  Constitutional: Negative for fever. Eyes: Negative for visual changes. ENT: Negative for sore throat. Cardiovascular: Negative for chest pain. Respiratory: Negative for shortness of breath. Gastrointestinal: Negative for abdominal pain, vomiting and diarrhea. Genitourinary: Negative for dysuria. Musculoskeletal: Negative for back pain. Skin: Negative for rash. Positive for bleeding from right chest dialysis catheter site Neurological: Negative for headaches, focal weakness or numbness.   10-point ROS otherwise negative.  ____________________________________________   PHYSICAL EXAM:  VITAL SIGNS: ED Triage Vitals   Enc Vitals Group     BP 08/25/15 0013 165/85 mmHg     Pulse Rate 08/25/15 0013 80     Resp --      Temp 08/25/15 0013 98.1 F (36.7 C)     Temp Source  08/25/15 0013 Oral     SpO2 08/25/15 0013 95 %     Weight 08/25/15 0013 147 lb (66.679 kg)     Height 08/25/15 0013 5\' 5"  (1.651 m)     Head Cir --      Peak Flow --      Pain Score 08/25/15 0014 7     Pain Loc --      Pain Edu? --      Excl. in [place]? --      Constitutional: Alert and oriented. Well appearing and in no distress. Eyes: Conjunctivae are normal. PERRL. Normal extraocular movements. ENT   Head: Normocephalic and atraumatic.   Nose: No congestion/rhinnorhea.   Mouth/Throat: Mucous membranes are moist.   Neck: No stridor. Hematological/Lymphatic/Immunilogical: No cervical lymphadenopathy. Cardiovascular: Normal rate, regular rhythm. Normal and symmetric distal pulses are present in all extremities. No murmurs, rubs, or gallops. No active bleeding noted from right chest dialysis catheter site. Overlying gauze had a moderate amount of blood on them. Respiratory: Normal respiratory effort without tachypnea nor retractions. Breath sounds are clear and equal bilaterally. No wheezes/rales/rhonchi. Gastrointestinal: Soft and nontender. No distention. There is no CVA tenderness. Genitourinary: deferred Musculoskeletal: Nontender with normal range of motion in all extremities. No joint effusions.  No lower extremity tenderness nor edema. Neurologic:  Normal speech and language. No gross focal neurologic deficits are appreciated. Speech is normal.  Skin:  Skin is warm, dry and intact. No rash noted. Psychiatric: Mood and affect are normal. Speech and behavior are normal. Patient exhibits appropriate insight and judgment.  ____________________________________________     INITIAL IMPRESSION / ASSESSMENT AND PLAN / ED COURSE  Pertinent labs & imaging results that were available during my care of the patient were  reviewed by me and considered in my medical decision making (see chart for details).  Patient was observed in the emergency department for an hour and a half with no active bleeding noted on multiple reexaminations.  ____________________________________________   FINAL CLINICAL IMPRESSION(S) / ED DIAGNOSES  Final diagnoses:  Bleeding due to dialysis catheter placement, subsequent encounter      Gregor Hams, MD 08/25/15 2257

## 2015-08-25 NOTE — ED Notes (Addendum)
Patient ambulatory to triage with steady gait, without difficulty or distress noted, smells heavily of cigarette smoke; pt reports dialysis shunt placed Tuesday; having bleeding from site tonight; last used Wednesday; dressing in place with mod amount blood on dressing noted

## 2015-08-25 NOTE — ED Notes (Signed)
MD at bedside. 

## 2015-08-25 NOTE — ED Notes (Signed)
Patient to ED for dialysis catheter site that continues to bleed. Was seen here earlier in the day for same.

## 2015-08-27 ENCOUNTER — Encounter: Payer: Self-pay | Admitting: Emergency Medicine

## 2015-08-27 ENCOUNTER — Emergency Department
Admission: EM | Admit: 2015-08-27 | Discharge: 2015-08-27 | Disposition: A | Discharge status: Home / Self Care | Payer: Medicare Other | Attending: Emergency Medicine | Admitting: Emergency Medicine

## 2015-08-27 ENCOUNTER — Emergency Department: Payer: Medicare Other

## 2015-08-27 DIAGNOSIS — Z7982 Long term (current) use of aspirin: Secondary | ICD-10-CM | POA: Insufficient documentation

## 2015-08-27 DIAGNOSIS — Z7951 Long term (current) use of inhaled steroids: Secondary | ICD-10-CM | POA: Insufficient documentation

## 2015-08-27 DIAGNOSIS — Z792 Long term (current) use of antibiotics: Secondary | ICD-10-CM | POA: Diagnosis not present

## 2015-08-27 DIAGNOSIS — Y841 Kidney dialysis as the cause of abnormal reaction of the patient, or of later complication, without mention of misadventure at the time of the procedure: Secondary | ICD-10-CM | POA: Diagnosis not present

## 2015-08-27 DIAGNOSIS — I120 Hypertensive chronic kidney disease with stage 5 chronic kidney disease or end stage renal disease: Secondary | ICD-10-CM | POA: Insufficient documentation

## 2015-08-27 DIAGNOSIS — Z452 Encounter for adjustment and management of vascular access device: Secondary | ICD-10-CM | POA: Diagnosis present

## 2015-08-27 DIAGNOSIS — Z79899 Other long term (current) drug therapy: Secondary | ICD-10-CM | POA: Insufficient documentation

## 2015-08-27 DIAGNOSIS — Z87891 Personal history of nicotine dependence: Secondary | ICD-10-CM | POA: Insufficient documentation

## 2015-08-27 DIAGNOSIS — N186 End stage renal disease: Secondary | ICD-10-CM | POA: Diagnosis not present

## 2015-08-27 DIAGNOSIS — T82398A Other mechanical complication of other vascular grafts, initial encounter: Secondary | ICD-10-CM | POA: Insufficient documentation

## 2015-08-27 DIAGNOSIS — T8241XA Breakdown (mechanical) of vascular dialysis catheter, initial encounter: Secondary | ICD-10-CM

## 2015-08-27 LAB — BASIC METABOLIC PANEL
Anion gap: 10 (ref 5–15)
BUN: 28 mg/dL — AB (ref 6–20)
CALCIUM: 8.6 mg/dL — AB (ref 8.9–10.3)
CO2: 28 mmol/L (ref 22–32)
CREATININE: 7.51 mg/dL — AB (ref 0.44–1.00)
Chloride: 102 mmol/L (ref 101–111)
GFR calc Af Amer: 6 mL/min — ABNORMAL LOW (ref >60)
GFR, EST NON AFRICAN AMERICAN: 5 mL/min — AB (ref >60)
Glucose, Bld: 130 mg/dL — ABNORMAL HIGH (ref 65–99)
Potassium: 3.9 mmol/L (ref 3.5–5.1)
SODIUM: 140 mmol/L (ref 135–145)

## 2015-08-27 LAB — CBC WITH DIFFERENTIAL/PLATELET
BASOS ABS: 0.1 10*3/uL (ref 0–0.1)
BASOS PCT: 1 %
EOS ABS: 0.3 10*3/uL (ref 0–0.7)
EOS PCT: 4 %
HCT: 33.5 % — ABNORMAL LOW (ref 35.0–47.0)
Hemoglobin: 10.9 g/dL — ABNORMAL LOW (ref 12.0–16.0)
Lymphocytes Relative: 22 %
Lymphs Abs: 1.6 10*3/uL (ref 1.0–3.6)
MCH: 31.5 pg (ref 26.0–34.0)
MCHC: 32.5 g/dL (ref 32.0–36.0)
MCV: 96.9 fL (ref 80.0–100.0)
Monocytes Absolute: 0.9 10*3/uL (ref 0.2–0.9)
Monocytes Relative: 12 %
Neutro Abs: 4.4 10*3/uL (ref 1.4–6.5)
Neutrophils Relative %: 61 %
PLATELETS: 249 10*3/uL (ref 150–440)
RBC: 3.45 MIL/uL — AB (ref 3.80–5.20)
RDW: 14.3 % (ref 11.5–14.5)
WBC: 7.4 10*3/uL (ref 3.6–11.0)

## 2015-08-27 MED ORDER — LORAZEPAM 1 MG PO TABS
1.0000 mg | ORAL_TABLET | Freq: Once | ORAL | Status: AC
  Administered 2015-08-27: 1 mg via ORAL
  Filled 2015-08-27: qty 1

## 2015-08-27 MED ORDER — TRAMADOL HCL 50 MG PO TABS
50.0000 mg | ORAL_TABLET | Freq: Once | ORAL | Status: AC
  Administered 2015-08-27: 50 mg via ORAL
  Filled 2015-08-27: qty 1

## 2015-08-27 NOTE — ED Provider Notes (Signed)
Time Seen: Approximately ----------------------------------------- 1:38 PM on 08/27/2015 -----------------------------------------    I have reviewed the triage notes  Chief Complaint: Vascular Access Problem   History of Present Illness: Michele Nash is a 59 y.o. female who presents with bleeding from her Vas-Cath site is located in her right subclavian area. Patient states she did receive dialysis yesterday and the catheter functioned fine. She was able to complete the full course of dialysis. She's been evaluated here in emergency department on a couple of occasions recently for bleeding from the site. Bleeding started early this morning. She states she's changed 2-3 dressings at home. She denies any lightheadedness or shortness of breath. She states she has some mild discomfort from the right side of her chest with a Vas-Cath site is. She denies any fever, nausea, vomiting.   Past Medical History  Diagnosis Date  . Chronic kidney disease   . COPD (chronic obstructive pulmonary disease) ([place])   . Hypertension   . Hyperlipidemia   . Depression   . Dialysis patient ([place]) 2014  . Obesity   . Bronchitis   . Headache     Patient Active Problem List   Diagnosis Date Noted  . ESRD on hemodialysis 03/29/2015  . HTN (hypertension) 03/29/2015  . COPD (chronic obstructive pulmonary disease) 03/29/2015  . GAD (generalized anxiety disorder) 03/29/2015    Past Surgical History  Procedure Laterality Date  . Abdominal hysterectomy    . Dialysis fistula creation    . Colonoscopy  2011  . Peripheral vascular catheterization N/A 05/10/2015    Procedure: A/V Shuntogram/Fistulagram;  Surgeon: Katha Cabal, MD;  Location: [place] [facility];  Service: Cardiovascular;  Laterality: N/A;  . Peripheral vascular catheterization Left 05/10/2015    Procedure: A/V Shunt Intervention;  Surgeon: Katha Cabal, MD;  Location: [place] [facility];  Service: Cardiovascular;  Laterality:  Left;  . Peripheral vascular catheterization Left 08/23/2015    Procedure: A/V Shuntogram/Fistulagram;  Surgeon: Katha Cabal, MD;  Location: [place] [facility];  Service: Cardiovascular;  Laterality: Left;  . Peripheral vascular catheterization N/A 08/23/2015    Procedure: A/V Shunt Intervention;  Surgeon: Katha Cabal, MD;  Location: [place] [facility];  Service: Cardiovascular;  Laterality: N/A;  . Peripheral vascular catheterization  08/23/2015    Procedure: Dialysis/Perma Catheter Insertion;  Surgeon: Katha Cabal, MD;  Location: [place] [facility];  Service: Cardiovascular;;    Past Surgical History  Procedure Laterality Date  . Abdominal hysterectomy    . Dialysis fistula creation    . Colonoscopy  2011  . Peripheral vascular catheterization N/A 05/10/2015    Procedure: A/V Shuntogram/Fistulagram;  Surgeon: Katha Cabal, MD;  Location: [place] [facility];  Service: Cardiovascular;  Laterality: N/A;  . Peripheral vascular catheterization Left 05/10/2015    Procedure: A/V Shunt Intervention;  Surgeon: Katha Cabal, MD;  Location: [place] [facility];  Service: Cardiovascular;  Laterality: Left;  . Peripheral vascular catheterization Left 08/23/2015    Procedure: A/V Shuntogram/Fistulagram;  Surgeon: Katha Cabal, MD;  Location: [place] [facility];  Service: Cardiovascular;  Laterality: Left;  . Peripheral vascular catheterization N/A 08/23/2015    Procedure: A/V Shunt Intervention;  Surgeon: Katha Cabal, MD;  Location: [place] [facility];  Service: Cardiovascular;  Laterality: N/A;  . Peripheral vascular catheterization  08/23/2015    Procedure: Dialysis/Perma Catheter Insertion;  Surgeon: Katha Cabal, MD;  Location: [place] [facility];  Service: Cardiovascular;;  Current Outpatient Rx  Name  Route  Sig  Dispense  Refill  . albuterol (PROVENTIL HFA;VENTOLIN HFA) 108 (90 BASE) MCG/ACT inhaler   Inhalation   Inhale 2 puffs into the  lungs every 6 (six) hours as needed for wheezing or shortness of breath.         . ALPRAZolam (XANAX) 0.5 MG tablet   Oral   Take 0.5 mg by mouth 2 (two) times daily.         Marland Kitchen amLODipine (NORVASC) 10 MG tablet   Oral   Take 1 tablet (10 mg total) by mouth daily.   30 tablet   1   . amLODipine (NORVASC) 5 MG tablet   Oral   Take 5 mg by mouth daily.         Marland Kitchen aspirin EC 81 MG EC tablet   Oral   Take 1 tablet (81 mg total) by mouth daily. Patient not taking: Reported on 08/23/2015   30 tablet   1   . calcium carbonate (OS-CAL) 600 MG TABS tablet   Oral   Take 600 mg by mouth 2 (two) times daily with a meal.         . carvedilol (COREG) 12.5 MG tablet   Oral   Take 12.5 mg by mouth daily.          . cinacalcet (SENSIPAR) 30 MG tablet   Oral   Take 30 mg by mouth daily.         . ciprofloxacin-dexamethasone (CIPRODEX) otic suspension   Left Ear   Place 4 drops into the left ear 2 (two) times daily. Patient not taking: Reported on 08/23/2015   7.5 mL   0   . clindamycin (CLEOCIN) 300 MG capsule   Oral   Take 1 capsule (300 mg total) by mouth 3 (three) times daily. Patient not taking: Reported on 08/23/2015   30 capsule   0   . ezetimibe (ZETIA) 10 MG tablet   Oral   Take 10 mg by mouth daily.         . fluticasone (VERAMYST) 27.5 MCG/SPRAY nasal spray   Nasal   Place 2 sprays into the nose daily.         . Fluticasone-Salmeterol (ADVAIR) 100-50 MCG/DOSE AEPB   Inhalation   Inhale 1 puff into the lungs 2 (two) times daily.         . furosemide (LASIX) 40 MG tablet   Oral   Take 40 mg by mouth.         . losartan (COZAAR) 100 MG tablet   Oral   Take 100 mg by mouth. On non-dialysis days         . multivitamin (RENA-VIT) TABS tablet   Oral   Take 1 tablet by mouth daily.         Marland Kitchen omeprazole (PRILOSEC) 20 MG capsule   Oral   Take 20 mg by mouth daily.         Marland Kitchen oxyCODONE-acetaminophen (ROXICET) 5-325 MG per tablet   Oral    Take 1-2 tablets by mouth every 4 (four) hours as needed for severe pain. Patient not taking: Reported on 08/23/2015   15 tablet   0   . predniSONE (DELTASONE) 20 MG tablet   Oral   Take 2 tablets (40 mg total) by mouth daily. Patient not taking: Reported on 08/23/2015   10 tablet   0   . sertraline (ZOLOFT) 50 MG tablet   Oral  Take 50 mg by mouth daily.           Allergies:  Sulfa antibiotics  Family History: Family History  Problem Relation Age of Onset  . Heart disease Mother   . Cancer Father   . Cancer Sister     Social History: Social History  Substance Use Topics  . Smoking status: Former Smoker -- 0.00 packs/day    Quit date: 11/23/2014  . Smokeless tobacco: Never Used  . Alcohol Use: No     Review of Systems:   10 point review of systems was performed and was otherwise negative:  Constitutional: No fever Eyes: No visual disturbances ENT: No sore throat, ear pain Cardiac: No chest pain Respiratory: No shortness of breath, wheezing, or stridor Abdomen: No abdominal pain, no vomiting, No diarrhea Endocrine: No weight loss, No night sweats Extremities: No peripheral edema, cyanosis Skin: No rashes, easy bruising Neurologic: No focal weakness, trouble with speech or swollowing Urologic: No dysuria, Hematuria, or urinary frequency    Physical Exam:  ED Triage Vitals  Enc Vitals Group     BP 08/27/15 1248 179/118 mmHg     Pulse Rate 08/27/15 1248 87     Resp 08/27/15 1248 20     Temp 08/27/15 1248 98.6 F (37 C)     Temp Source 08/27/15 1248 Oral     SpO2 08/27/15 1248 100 %     Weight 08/27/15 1248 147 lb (66.679 kg)     Height 08/27/15 1248 5\' 5"  (1.651 m)     Head Cir --      Peak Flow --      Pain Score 08/27/15 1249 9     Pain Loc --      Pain Edu? --      Excl. in [place]? --     General: Awake , Alert , and Oriented times 3; GCS 15 Head: Normal cephalic , atraumatic Eyes: Pupils equal , round, reactive to light Nose/Throat: No  nasal drainage, patent upper airway without erythema or exudate.  Neck: Supple, Full range of motion, No anterior adenopathy or palpable thyroid masses Lungs: Clear to ascultation without wheezes , rhonchi, or rales Heart: Regular rate, regular rhythm without murmurs , gallops , or rubs Abdomen: Soft, non tender without rebound, guarding , or rigidity; bowel sounds positive and symmetric in all 4 quadrants. No organomegaly .        Extremities: old Vas-Cath in the left upper extremity with good thrill 2 plus symmetric pulses. No edema, clubbing or cyanosis Neurologic: normal ambulation, Motor symmetric without deficits, sensory intact Skin: warm, dry, no rashes Examination of the catheter site so shows some old dried blood surrounding the catheter where it enters into the skin. No active bleeding at this time. Old Bandage with a small amount of blood was removed. She has some mild ecchymosis superior to that side toward the internal jugular area without significant swelling or palpable hematoma.  Labs:   All laboratory work was reviewed including any pertinent negatives or positives listed below:  Labs Reviewed  BASIC METABOLIC PANEL  CBC WITH DIFFERENTIAL/PLATELET   review laboratory work shows no significant abnormalities for the patient. She is a dialysis patient and her potassium appears to be stable.     Radiology:   EXAM: CHEST 2 VIEW  COMPARISON: May 01, 2015  FINDINGS: Central catheter tip is at the cavoatrial junction. No pneumothorax. There is mild scarring in the left base. Lungs elsewhere are clear. Heart is borderline  enlarged with pulmonary vascularity within normal limits. No adenopathy. No bone lesions.  IMPRESSION: Central catheter tip at cavoatrial junction without pneumothorax. No edema or consolidation. Heart borderline enlarged, stable.  I personally reviewed the radiologic studies    ED Course:  Patient had [place] clotting bandage applied along with  Vaseline and gauze to wrap her wound and she was given some other clotting gauze for outpatient management if it rebleeds. He was again encouraged to follow up with her vascular surgeon and call them on Monday and explain the intermittent bleeding coming from the Vas-Cath area. It does appear to be functioning, in appropriate position, with no consistent complications.     Assessment:  Hemodialysis vascular catheter bleeding     Plan:  Patient was advised to return immediately if condition worsens. Patient was advised to follow up with her primary care physician or other specialized physicians involved and in their current assessment. Daymon Larsen, MD 08/27/15 [phone]

## 2015-08-27 NOTE — ED Notes (Signed)
Pt reports bleeding from Right chest pain port for dialysis. State she last had dialysis yesterday. Pt reports continued bleeding since this morning. States she was seen here Wednesday for the same thing.

## 2015-08-27 NOTE — Discharge Instructions (Signed)
Dialysis Vascular Access Malfunction A vascular access is an entrance to your blood vessels that can be used for dialysis. A vascular access can be made in one of several ways:   Joining an artery to a vein under your skin to make a bigger blood vessel called a fistula.   Joining an artery to a vein under your skin using a soft tube called a graft.   Placing a thin, flexible tube (catheter) in a large vein, usually in your neck.  A vascular access may malfunction or become blocked.  WHAT CAN CAUSE YOUR VASCULAR ACCESS TO MALFUNCTION?  Infection (common).   A blood clot inside a part of the fistula, graft, or catheter. A blood clot can completely or partially block the flow of blood.   A kink in the graft or catheter.   A collection of blood (called a hematoma or bruise) next to the graft or catheter that pushes against it, blocking the flow of blood.  WHAT ARE SIGNS AND SYMPTOMS OF VASCULAR ACCESS MALFUNCTION?  There is a change in the vibration or pulse of your fistula or graft.  The vibration or pulse of your fistula or graft is gone.   There is new or unusual swelling of the area around the access.   There was an unsuccessful puncture of your access by the dialysis team.   The flow of blood through the fistula, graft, or catheter is too slow for effective dialysis.   When routine dialysis is completed and the needle is removed, bleeding lasts for too long a time.  WHAT HAPPENS IF MY VASCULAR ACCESS MALFUNCTIONS? Your health care provider may order blood work, cultures, or an X-ray test in order to learn what may be wrong with your vascular access. The X-ray test involves the injection of a liquid into the vascular access. The liquid shows up on the X-ray and allows your health care provider to see if there is a blockage in the vascular access.  Treatment varies depending on the cause of the malfunction:   If the vascular access is infected, your health care provider  may prescribe antibiotic medicine to control the infection.   If a clot is found in the vascular access, you may need surgery to remove the clot.   If a blockage in the vascular access is due to some other cause (such as a kink in a graft), then you will likely need surgery to unblock or replace the graft.  HOME CARE INSTRUCTIONS: Follow up with your surgeon or other health care provider if you were instructed to do so. This is very important. Any delay in follow-up could cause permanent dysfunction of the vascular access, which may be dangerous.  SEEK MEDICAL CARE IF:   Fever develops.   Swelling and pain around the vascular access gets worse or new pain develops.  Pain, numbness, or an unusual pale skin color develops in the hand on the side of your vascular access. SEEK IMMEDIATE MEDICAL CARE IF: Unusual bleeding develops at the location of the vascular access. MAKE SURE YOU:  Understand these instructions.  Will watch your condition.  Will get help right away if you are not doing well or get worse. Document Released: 10/15/2006 Document Revised: 03/29/2014 Document Reviewed: 04/16/2013 [facility] Patient Information 2015 [place], [place]. This information is not intended to replace advice given to you by your health care provider. Make sure you discuss any questions you have with your health care provider.  Please return immediately if condition  worsens. Please contact her primary physician or the physician you were given for referral. If you have any specialist physicians involved in her treatment and plan please also contact them. Thank you for using [facility].

## 2015-09-14 ENCOUNTER — Other Ambulatory Visit: Payer: Self-pay

## 2015-09-14 DIAGNOSIS — Z1211 Encounter for screening for malignant neoplasm of colon: Secondary | ICD-10-CM

## 2015-09-14 MED ORDER — PEG 3350-KCL-NA BICARB-NACL 420 G PO SOLR: 4000.0000 mL | ORAL | Status: DC

## 2015-09-16 ENCOUNTER — Other Ambulatory Visit: Payer: Self-pay | Admitting: Vascular Surgery

## 2015-09-20 ENCOUNTER — Encounter: Payer: Self-pay | Admitting: *Deleted

## 2015-09-20 ENCOUNTER — Ambulatory Visit
Admission: RE | Admit: 2015-09-20 | Discharge: 2015-09-20 | Disposition: A | Discharge status: Home / Self Care | Payer: Medicare Other | Source: Ambulatory Visit | Attending: Gastroenterology | Admitting: Gastroenterology

## 2015-09-20 ENCOUNTER — Encounter
Admission: RE | Disposition: A | Discharge status: Home / Self Care | Payer: Self-pay | Source: Ambulatory Visit | Attending: Gastroenterology

## 2015-09-20 ENCOUNTER — Ambulatory Visit: Payer: Medicare Other | Admitting: Anesthesiology

## 2015-09-20 DIAGNOSIS — Z90710 Acquired absence of both cervix and uterus: Secondary | ICD-10-CM | POA: Diagnosis not present

## 2015-09-20 DIAGNOSIS — Z7982 Long term (current) use of aspirin: Secondary | ICD-10-CM | POA: Diagnosis not present

## 2015-09-20 DIAGNOSIS — F329 Major depressive disorder, single episode, unspecified: Secondary | ICD-10-CM | POA: Diagnosis not present

## 2015-09-20 DIAGNOSIS — D125 Benign neoplasm of sigmoid colon: Secondary | ICD-10-CM | POA: Insufficient documentation

## 2015-09-20 DIAGNOSIS — N186 End stage renal disease: Secondary | ICD-10-CM | POA: Insufficient documentation

## 2015-09-20 DIAGNOSIS — E785 Hyperlipidemia, unspecified: Secondary | ICD-10-CM | POA: Insufficient documentation

## 2015-09-20 DIAGNOSIS — R51 Headache: Secondary | ICD-10-CM | POA: Diagnosis not present

## 2015-09-20 DIAGNOSIS — Z87891 Personal history of nicotine dependence: Secondary | ICD-10-CM | POA: Insufficient documentation

## 2015-09-20 DIAGNOSIS — Z79899 Other long term (current) drug therapy: Secondary | ICD-10-CM | POA: Diagnosis not present

## 2015-09-20 DIAGNOSIS — K641 Second degree hemorrhoids: Secondary | ICD-10-CM | POA: Insufficient documentation

## 2015-09-20 DIAGNOSIS — I120 Hypertensive chronic kidney disease with stage 5 chronic kidney disease or end stage renal disease: Secondary | ICD-10-CM | POA: Diagnosis not present

## 2015-09-20 DIAGNOSIS — Z8249 Family history of ischemic heart disease and other diseases of the circulatory system: Secondary | ICD-10-CM | POA: Diagnosis not present

## 2015-09-20 DIAGNOSIS — Z800 Family history of malignant neoplasm of digestive organs: Secondary | ICD-10-CM | POA: Insufficient documentation

## 2015-09-20 DIAGNOSIS — Z882 Allergy status to sulfonamides status: Secondary | ICD-10-CM | POA: Diagnosis not present

## 2015-09-20 DIAGNOSIS — Z7951 Long term (current) use of inhaled steroids: Secondary | ICD-10-CM | POA: Diagnosis not present

## 2015-09-20 DIAGNOSIS — Z6824 Body mass index (BMI) 24.0-24.9, adult: Secondary | ICD-10-CM | POA: Insufficient documentation

## 2015-09-20 DIAGNOSIS — J40 Bronchitis, not specified as acute or chronic: Secondary | ICD-10-CM | POA: Diagnosis not present

## 2015-09-20 DIAGNOSIS — D122 Benign neoplasm of ascending colon: Secondary | ICD-10-CM

## 2015-09-20 DIAGNOSIS — K5730 Diverticulosis of large intestine without perforation or abscess without bleeding: Secondary | ICD-10-CM | POA: Diagnosis not present

## 2015-09-20 DIAGNOSIS — E669 Obesity, unspecified: Secondary | ICD-10-CM | POA: Insufficient documentation

## 2015-09-20 DIAGNOSIS — Z992 Dependence on renal dialysis: Secondary | ICD-10-CM | POA: Insufficient documentation

## 2015-09-20 DIAGNOSIS — Z1211 Encounter for screening for malignant neoplasm of colon: Secondary | ICD-10-CM | POA: Insufficient documentation

## 2015-09-20 DIAGNOSIS — J449 Chronic obstructive pulmonary disease, unspecified: Secondary | ICD-10-CM | POA: Diagnosis not present

## 2015-09-20 HISTORY — PX: COLONOSCOPY WITH PROPOFOL: SHX5780

## 2015-09-20 SURGERY — COLONOSCOPY WITH PROPOFOL
Anesthesia: General

## 2015-09-20 MED ORDER — PROPOFOL 500 MG/50ML IV EMUL
INTRAVENOUS | Status: DC | PRN
  Administered 2015-09-20: 75 ug/kg/min via INTRAVENOUS

## 2015-09-20 MED ORDER — LABETALOL HCL 5 MG/ML IV SOLN
10.0000 mg | Freq: Once | INTRAVENOUS | Status: AC
  Administered 2015-09-20: 10 mg via INTRAVENOUS

## 2015-09-20 MED ORDER — ONDANSETRON HCL 4 MG/2ML IJ SOLN: 4.0000 mg | Freq: Once | INTRAMUSCULAR | Status: DC

## 2015-09-20 MED ORDER — ONDANSETRON HCL 4 MG/2ML IJ SOLN
INTRAMUSCULAR | Status: AC
  Administered 2015-09-20: 11:00:00 via INTRAVENOUS
  Filled 2015-09-20: qty 2

## 2015-09-20 MED ORDER — SODIUM CHLORIDE 0.9 % IV SOLN
INTRAVENOUS | Status: DC
  Administered 2015-09-20: 10:00:00 via INTRAVENOUS

## 2015-09-20 MED ORDER — SODIUM CHLORIDE 0.9 % IV SOLN
INTRAVENOUS | Status: DC
  Administered 2015-09-20: 09:00:00 via INTRAVENOUS

## 2015-09-20 NOTE — H&P (Signed)
[facility]  [address] [place], [place] [address] Phone: [phone] Fax : [phone]  Primary Care Physician:  [name], MD Primary Gastroenterologist:  Dr. Allen Norris  Pre-Procedure History & Physical: HPI:  Michele Nash is a 59 y.o. female is here for a screening colonoscopy.   Past Medical History  Diagnosis Date  . Chronic kidney disease   . COPD (chronic obstructive pulmonary disease) ([place])   . Hypertension   . Hyperlipidemia   . Depression   . Dialysis patient ([place]) 2014  . Obesity   . Bronchitis   . Headache     Past Surgical History  Procedure Laterality Date  . Abdominal hysterectomy    . Dialysis fistula creation    . Colonoscopy  2011  . Peripheral vascular catheterization N/A 05/10/2015    Procedure: A/V Shuntogram/Fistulagram;  Surgeon: Katha Cabal, MD;  Location: [place] [facility];  Service: Cardiovascular;  Laterality: N/A;  . Peripheral vascular catheterization Left 05/10/2015    Procedure: A/V Shunt Intervention;  Surgeon: Katha Cabal, MD;  Location: [place] [facility];  Service: Cardiovascular;  Laterality: Left;  . Peripheral vascular catheterization Left 08/23/2015    Procedure: A/V Shuntogram/Fistulagram;  Surgeon: Katha Cabal, MD;  Location: [place] [facility];  Service: Cardiovascular;  Laterality: Left;  . Peripheral vascular catheterization N/A 08/23/2015    Procedure: A/V Shunt Intervention;  Surgeon: Katha Cabal, MD;  Location: [place] [facility];  Service: Cardiovascular;  Laterality: N/A;  . Peripheral vascular catheterization  08/23/2015    Procedure: Dialysis/Perma Catheter Insertion;  Surgeon: Katha Cabal, MD;  Location: [place] [facility];  Service: Cardiovascular;;    Prior to Admission medications   Medication Sig Start Date End Date Taking? Authorizing Provider  carvedilol (COREG) 12.5 MG tablet Take 12.5 mg by mouth daily.    Yes Historical Provider, MD  albuterol  (PROVENTIL HFA;VENTOLIN HFA) 108 (90 BASE) MCG/ACT inhaler Inhale 2 puffs into the lungs every 6 (six) hours as needed for wheezing or shortness of breath.    Historical Provider, MD  ALPRAZolam Duanne Moron) 0.5 MG tablet Take 0.5 mg by mouth 2 (two) times daily.    Historical Provider, MD  amLODipine (NORVASC) 10 MG tablet Take 1 tablet (10 mg total) by mouth daily. 03/31/15   Aldean Jewett, MD  amLODipine (NORVASC) 5 MG tablet Take 5 mg by mouth daily.    Historical Provider, MD  aspirin EC 81 MG EC tablet Take 1 tablet (81 mg total) by mouth daily. Patient not taking: Reported on 08/23/2015 03/31/15   Aldean Jewett, MD  calcium carbonate (OS-CAL) 600 MG TABS tablet Take 600 mg by mouth 2 (two) times daily with a meal.    Historical Provider, MD  cinacalcet (SENSIPAR) 30 MG tablet Take 30 mg by mouth daily.    Historical Provider, MD  ciprofloxacin-dexamethasone (CIPRODEX) otic suspension Place 4 drops into the left ear 2 (two) times daily. Patient not taking: Reported on 08/23/2015 03/31/15   Aldean Jewett, MD  clindamycin (CLEOCIN) 300 MG capsule Take 1 capsule (300 mg total) by mouth 3 (three) times daily. Patient not taking: Reported on 08/23/2015 05/23/15   Pierce Crane Beers, PA-C  ezetimibe (ZETIA) 10 MG tablet Take 10 mg by mouth daily.    Historical Provider, MD  fluticasone (VERAMYST) 27.5 MCG/SPRAY nasal spray Place 2 sprays into the nose daily.    Historical Provider, MD  Fluticasone-Salmeterol (ADVAIR) 100-50 MCG/DOSE AEPB Inhale 1 puff  into the lungs 2 (two) times daily.    Historical Provider, MD  furosemide (LASIX) 40 MG tablet Take 40 mg by mouth.    Historical Provider, MD  losartan (COZAAR) 100 MG tablet Take 100 mg by mouth. On non-dialysis days    Historical Provider, MD  multivitamin (RENA-VIT) TABS tablet Take 1 tablet by mouth daily.    Historical Provider, MD  omeprazole (PRILOSEC) 20 MG capsule Take 20 mg by mouth daily.    Historical Provider, MD  oxyCODONE-acetaminophen  (ROXICET) 5-325 MG per tablet Take 1-2 tablets by mouth every 4 (four) hours as needed for severe pain. Patient not taking: Reported on 08/23/2015 05/23/15   Pierce Crane Beers, PA-C  polyethylene glycol-electrolytes (TRILYTE) 420 G solution Take 4,000 mLs by mouth as directed. Drink one 8 oz glass every 30 mins until stools run clear. 09/14/15   Lucilla Lame, MD  predniSONE (DELTASONE) 20 MG tablet Take 2 tablets (40 mg total) by mouth daily. Patient not taking: Reported on 08/23/2015 05/01/15   Harvest Dark, MD  sertraline (ZOLOFT) 50 MG tablet Take 50 mg by mouth daily.    Historical Provider, MD    Allergies as of 07/26/2015 - Review Complete 05/24/2015  Allergen Reaction Noted  . Sulfa antibiotics Swelling 10/27/2013    Family History  Problem Relation Age of Onset  . Heart disease Mother   . Cancer Father   . Cancer Sister     Social History   Social History  . Marital Status: Married    Spouse Name: N/A  . Number of Children: N/A  . Years of Education: N/A   Occupational History  . Not on file.   Social History Main Topics  . Smoking status: Former Smoker -- 0.00 packs/day    Quit date: 11/23/2014  . Smokeless tobacco: Never Used  . Alcohol Use: No  . Drug Use: No  . Sexual Activity: Not on file   Other Topics Concern  . Not on file   Social History Narrative    Review of Systems: See HPI, otherwise negative ROS  Physical Exam: BP 214/95 mmHg  Pulse 86  Temp(Src) 98.5 F (36.9 C) (Tympanic)  Resp 18  Ht 5\' 5"  (1.651 m)  Wt 145 lb (65.772 kg)  BMI 24.13 kg/m2  SpO2 97% General:   Alert,  pleasant and cooperative in NAD Head:  Normocephalic and atraumatic. Neck:  Supple; no masses or thyromegaly. Lungs:  Clear throughout to auscultation.    Heart:  Regular rate and rhythm. Abdomen:  Soft, nontender and nondistended. Normal bowel sounds, without guarding, and without rebound.   Neurologic:  Alert and  oriented x4;  grossly normal  neurologically.  Impression/Plan: Michele Nash is now here to undergo a screening colonoscopy.  Risks, benefits, and alternatives regarding colonoscopy have been reviewed with the patient.  Questions have been answered.  All parties agreeable.

## 2015-09-20 NOTE — Anesthesia Procedure Notes (Signed)
Date/Time: 09/20/2015 9:38 AM Performed by: OVPCHEK, Oneil Behney Oxygen Delivery Method: Nasal cannula

## 2015-09-20 NOTE — Anesthesia Postprocedure Evaluation (Signed)
  Anesthesia Post-op Note  Patient: Michele Nash  Procedure(s) Performed: Procedure(s): COLONOSCOPY WITH PROPOFOL (N/A)  Anesthesia type:General  Patient location: PACU  Post pain: Pain level controlled  Post assessment: Post-op Vital signs reviewed, Patient's Cardiovascular Status Stable, Respiratory Function Stable, Patent Airway and No signs of Nausea or vomiting  Post vital signs: Reviewed and stable  Last Vitals:  Filed Vitals:   09/20/15 1050  BP: 172/73  Pulse: 58  Temp:   Resp: 21    Level of consciousness: awake, alert  and patient cooperative  Complications: No apparent anesthesia complications

## 2015-09-20 NOTE — Transfer of Care (Signed)
Immediate Anesthesia Transfer of Care Note  Patient: Michele Nash  Procedure(s) Performed: Procedure(s): COLONOSCOPY WITH PROPOFOL (N/A)  Patient Location: PACU  Anesthesia Type:General  Level of Consciousness: awake  Airway & Oxygen Therapy: Patient Spontanous Breathing  Post-op Assessment: Report given to RN  Post vital signs: stable  Last Vitals:  Filed Vitals:   09/20/15 1013  BP: 171/80  Pulse: 79  Temp: 36.6 C  Resp: 18    Complications: No apparent anesthesia complications

## 2015-09-20 NOTE — Anesthesia Preprocedure Evaluation (Signed)
Anesthesia Evaluation  Patient identified by MRN, date of birth, ID band Patient awake    Reviewed: Allergy & Precautions, H&P , NPO status , Patient's Chart, lab work & pertinent test results  History of Anesthesia Complications Negative for: history of anesthetic complications  Airway Mallampati: III  TM Distance: >3 FB Neck ROM: limited    Dental  (+) Poor Dentition, Chipped, Missing, Upper Dentures, Lower Dentures   Pulmonary shortness of breath, COPD, former smoker,    Pulmonary exam normal breath sounds clear to auscultation       Cardiovascular Exercise Tolerance: Poor hypertension, (-) angina+ DOE  (-) Past MI Normal cardiovascular exam Rhythm:regular Rate:Normal     Neuro/Psych  Headaches, PSYCHIATRIC DISORDERS Depression    GI/Hepatic negative GI ROS, Neg liver ROS,   Endo/Other  negative endocrine ROS  Renal/GU DialysisRenal disease  negative genitourinary   Musculoskeletal   Abdominal   Peds  Hematology negative hematology ROS (+)   Anesthesia Other Findings Past Medical History:   Chronic kidney disease                                       COPD (chronic obstructive pulmonary disease) (*              Hypertension                                                 Hyperlipidemia                                               Depression                                                   Dialysis patient ([place])                          2014         Obesity                                                      Bronchitis                                                   Headache                                                    Past Surgical History:   ABDOMINAL HYSTERECTOMY  DIALYSIS FISTULA CREATION                                     COLONOSCOPY                                      2011         PERIPHERAL VASCULAR CATHETERIZATION             N/A 05/10/2015   Comment:Procedure: A/V Shuntogram/Fistulagram;                Surgeon: Katha Cabal, MD;  Location: [facility];  Service: Cardiovascular;                Laterality: N/A;   PERIPHERAL VASCULAR CATHETERIZATION             Left 05/10/2015      Comment:Procedure: A/V Shunt Intervention;  Surgeon:               Katha Cabal, MD;  Location: [facility];  Service: Cardiovascular;  Laterality:               Left;   PERIPHERAL VASCULAR CATHETERIZATION             Left 08/23/2015      Comment:Procedure: A/V Shuntogram/Fistulagram;                Surgeon: Katha Cabal, MD;  Location: [place] [facility];  Service: Cardiovascular;                Laterality: Left;   PERIPHERAL VASCULAR CATHETERIZATION             N/A 08/23/2015      Comment:Procedure: A/V Shunt Intervention;  Surgeon:               Katha Cabal, MD;  Location: [facility]              [facility];  Service: Cardiovascular;  Laterality:               N/A;   PERIPHERAL VASCULAR CATHETERIZATION              08/23/2015      Comment:Procedure: Dialysis/Perma Catheter Insertion;                Surgeon: Katha Cabal, MD;  Location: [facility];  Service: Cardiovascular;;  BMI    Body Mass Index   24.12 kg/m 2      Reproductive/Obstetrics negative OB ROS                             Anesthesia Physical Anesthesia Plan  ASA: III  Anesthesia Plan: General   Post-op Pain Management:    Induction:   Airway Management Planned:   Additional Equipment:   Intra-op Plan:   Post-operative Plan:   Informed Consent: I have reviewed the patients History and Physical, chart, labs and discussed  the procedure including the risks, benefits and alternatives for the proposed anesthesia with the patient or authorized representative who has indicated his/her understanding and acceptance.   Dental Advisory Given  Plan  Discussed with: Anesthesiologist, CRNA and Surgeon  Anesthesia Plan Comments:         Anesthesia Quick Evaluation

## 2015-09-20 NOTE — Op Note (Signed)
[facility] Gastroenterology Patient Name: Michele Nash Procedure Date: 09/20/2015 9:27 AM MRN: [id number] Account #: [id number] Date of Birth: Jun 03, 1956 Admit Type: Outpatient Age: 59 Room: [facility] ENDO ROOM 4 Gender: Female Note Status: Finalized Procedure:         Colonoscopy Indications:       Screening for colorectal malignant neoplasm Providers:         Lucilla Lame, MD Referring MD:      Venetia Maxon. Elijio Miles, MD (Referring MD) Medicines:         Propofol per Anesthesia Complications:     No immediate complications. Procedure:         Pre-Anesthesia Assessment:                    - Prior to the procedure, a History and Physical was                     performed, and patient medications and allergies were                     reviewed. The patient's tolerance of previous anesthesia                     was also reviewed. The risks and benefits of the procedure                     and the sedation options and risks were discussed with the                     patient. All questions were answered, and informed consent                     was obtained. Prior Anticoagulants: The patient has taken                     no previous anticoagulant or antiplatelet agents. ASA                     Grade Assessment: III - A patient with severe systemic                     disease. After reviewing the risks and benefits, the                     patient was deemed in satisfactory condition to undergo                     the procedure.                    After obtaining informed consent, the colonoscope was                     passed under direct vision. Throughout the procedure, the                     patient's blood pressure, pulse, and oxygen saturations                     were monitored continuously. The Olympus CF-Q160AL                     colonoscope (S#. [id number]) was introduced through the anus  and advanced to the the cecum, identified by appendiceal                      orifice and ileocecal valve. The colonoscopy was performed                     without difficulty. The patient tolerated the procedure                     well. The quality of the bowel preparation was poor. Findings:      The perianal and digital rectal examinations were normal.      A large amount of semi-liquid stool was found in the entire colon,       making visualization difficult.      A 9 mm polyp was found in the ascending colon. The polyp was       pedunculated. The polyp was removed with a hot snare. Resection and       retrieval were complete.      A 8 mm polyp was found in the sigmoid colon. The polyp was sessile. The       polyp was removed with a hot snare. Resection and retrieval were       complete.      Multiple small-mouthed diverticula were found in the sigmoid colon.      Non-bleeding internal hemorrhoids were found during retroflexion. The       hemorrhoids were Grade II (internal hemorrhoids that prolapse but reduce       spontaneously). Impression:        - Preparation of the colon was poor.                    - Stool in the entire examined colon.                    - One 9 mm polyp in the ascending colon. Resected and                     retrieved.                    - One 8 mm polyp in the sigmoid colon. Resected and                     retrieved.                    - Diverticulosis in the sigmoid colon.                    - Non-bleeding internal hemorrhoids. Recommendation:    - Repeat colonoscopy in 3 years for surveillance. Procedure Code(s): --- Professional ---                    [phone], Colonoscopy, flexible; with removal of tumor(s),                     polyp(s), or other lesion(s) by snare technique Diagnosis Code(s): --- Professional ---                    Z12.11, Encounter for screening for malignant neoplasm of                     colon  K64.1, Second degree hemorrhoids                    D12.2, Benign  neoplasm of ascending colon                    D12.5, Benign neoplasm of sigmoid colon CPT copyright 2014 American Medical [employer]. All rights reserved. The codes documented in this report are preliminary and upon coder review may  be revised to meet current compliance requirements. Lucilla Lame, MD 09/20/2015 10:11:14 AM This report has been signed electronically. Number of Addenda: 0 Note Initiated On: 09/20/2015 9:27 AM Scope Withdrawal Time: 0 hours 13 minutes 3 seconds  Total Procedure Duration: 0 hours 30 minutes 9 seconds       [facility]

## 2015-09-21 LAB — SURGICAL PATHOLOGY

## 2015-09-22 ENCOUNTER — Encounter: Payer: Self-pay | Admitting: Gastroenterology

## 2015-09-26 ENCOUNTER — Other Ambulatory Visit: Payer: Medicare Other

## 2015-09-26 ENCOUNTER — Ambulatory Visit
Admission: RE | Admit: 2015-09-26 | Discharge: 2015-09-26 | Disposition: A | Discharge status: Home / Self Care | Payer: Medicare Other | Source: Ambulatory Visit | Attending: Vascular Surgery | Admitting: Vascular Surgery

## 2015-09-26 ENCOUNTER — Other Ambulatory Visit: Payer: Self-pay | Admitting: Internal Medicine

## 2015-09-26 ENCOUNTER — Encounter
Admission: RE | Admit: 2015-09-26 | Discharge: 2015-09-26 | Disposition: A | Discharge status: Home / Self Care | Payer: Medicare Other | Source: Ambulatory Visit | Attending: Vascular Surgery | Admitting: Vascular Surgery

## 2015-09-26 DIAGNOSIS — Z01818 Encounter for other preprocedural examination: Secondary | ICD-10-CM | POA: Diagnosis present

## 2015-09-26 DIAGNOSIS — N189 Chronic kidney disease, unspecified: Secondary | ICD-10-CM | POA: Diagnosis not present

## 2015-09-26 HISTORY — DX: Sleep apnea, unspecified: G47.30

## 2015-09-26 HISTORY — DX: Gastro-esophageal reflux disease without esophagitis: K21.9

## 2015-09-26 HISTORY — DX: Unspecified complication of cardiac and vascular prosthetic device, implant and graft, initial encounter: T82.9XXA

## 2015-09-26 HISTORY — DX: Anemia, unspecified: D64.9

## 2015-09-26 HISTORY — DX: Unspecified osteoarthritis, unspecified site: M19.90

## 2015-09-26 LAB — TYPE AND SCREEN
ABO/RH(D): O POS
ANTIBODY SCREEN: NEGATIVE

## 2015-09-26 LAB — CBC WITH DIFFERENTIAL/PLATELET
BASOS ABS: 0.1 10*3/uL (ref 0–0.1)
Basophils Relative: 1 %
Eosinophils Absolute: 0.3 10*3/uL (ref 0–0.7)
Eosinophils Relative: 4 %
HCT: 30.3 % — ABNORMAL LOW (ref 35.0–47.0)
HEMOGLOBIN: 10.1 g/dL — AB (ref 12.0–16.0)
LYMPHS ABS: 1.2 10*3/uL (ref 1.0–3.6)
LYMPHS PCT: 15 %
MCH: 32.4 pg (ref 26.0–34.0)
MCHC: 33.4 g/dL (ref 32.0–36.0)
MCV: 97.1 fL (ref 80.0–100.0)
Monocytes Absolute: 0.7 10*3/uL (ref 0.2–0.9)
Monocytes Relative: 9 %
NEUTROS PCT: 71 %
Neutro Abs: 5.5 10*3/uL (ref 1.4–6.5)
Platelets: 257 10*3/uL (ref 150–440)
RBC: 3.13 MIL/uL — AB (ref 3.80–5.20)
RDW: 14.7 % — ABNORMAL HIGH (ref 11.5–14.5)
WBC: 7.7 10*3/uL (ref 3.6–11.0)

## 2015-09-26 LAB — BASIC METABOLIC PANEL
Anion gap: 13 (ref 5–15)
BUN: 44 mg/dL — ABNORMAL HIGH (ref 6–20)
CHLORIDE: 99 mmol/L — AB (ref 101–111)
CO2: 27 mmol/L (ref 22–32)
Calcium: 8.5 mg/dL — ABNORMAL LOW (ref 8.9–10.3)
Creatinine, Ser: 10.24 mg/dL — ABNORMAL HIGH (ref 0.44–1.00)
GFR, EST AFRICAN AMERICAN: 4 mL/min — AB (ref >60)
GFR, EST NON AFRICAN AMERICAN: 4 mL/min — AB (ref >60)
Glucose, Bld: 111 mg/dL — ABNORMAL HIGH (ref 65–99)
POTASSIUM: 4.5 mmol/L (ref 3.5–5.1)
SODIUM: 139 mmol/L (ref 135–145)

## 2015-09-26 LAB — ABO/RH: ABO/RH(D): O POS

## 2015-09-26 LAB — APTT: APTT: 30 s (ref 24–36)

## 2015-09-26 LAB — PROTIME-INR
INR: 0.96
PROTHROMBIN TIME: 13 s (ref 11.4–15.0)

## 2015-09-26 NOTE — Patient Instructions (Signed)
  Your procedure is scheduled on: 10/07/15 Report to Day Surgery.[facility] SECOND FLOOR To find out your arrival time please call [phone] between 1PM - 3PM on 10/06/15.  Remember: Instructions that are not followed completely may result in serious medical risk, up to and including death, or upon the discretion of your surgeon and anesthesiologist your surgery may need to be rescheduled.    _X___ 1. Do not eat food or drink liquids after midnight. No gum chewing or hard candies.     __X__ 2. No Alcohol for 24 hours before or after surgery.   ____ 3. Bring all medications with you on the day of surgery if instructed.    __X__ 4. Notify your doctor if there is any change in your medical condition     (cold, fever, infections).     Do not wear jewelry, make-up, hairpins, clips or nail polish.  Do not wear lotions, powders, or perfumes. You may wear deodorant.  Do not shave 48 hours prior to surgery. Men may shave face and neck.  Do not bring valuables to the hospital.    [facility] is not responsible for any belongings or valuables.               Contacts, dentures or bridgework may not be worn into surgery.  Leave your suitcase in the car. After surgery it may be brought to your room.  For patients admitted to the hospital, discharge time is determined by your                treatment team.   Patients discharged the day of surgery will not be allowed to drive home.   Please read over the following fact sheets that you were given:   Surgical Site Infection Prevention   ____ Take these medicines the morning of surgery with A SIP OF WATER:    1. XANAX  2.AMLODIPINE   3. CARVEDILOL  4.LOSARTAN  5.OMEPRAZOLE  6.SERTRALINE  ____ Fleet Enema (as directed)   __X__ Use CHG Soap as directed  __X__ Use inhalers on the day of surgery  ____ Stop metformin 2 days prior to surgery    ____ Take 1/2 of usual insulin dose the night before surgery and none on the morning of  surgery.   ____ Stop Coumadin/Plavix/aspirin on  ____ Stop Anti-inflammatories on  ____ Stop supplements until after surgery.    ____ Bring C-Pap to the hospital.

## 2015-10-07 ENCOUNTER — Encounter: Admission: RE | Payer: Self-pay | Source: Ambulatory Visit

## 2015-10-07 ENCOUNTER — Encounter: Payer: Self-pay | Admitting: Anesthesiology

## 2015-10-07 ENCOUNTER — Ambulatory Visit: Admission: RE | Admit: 2015-10-07 | Payer: Medicare Other | Source: Ambulatory Visit | Admitting: Vascular Surgery

## 2015-10-07 SURGERY — ARTERIOVENOUS (AV) FISTULA CREATION
Anesthesia: Choice | Laterality: Left

## 2015-10-07 MED ORDER — CEFAZOLIN SODIUM-DEXTROSE 2-3 GM-% IV SOLR: 2.0000 g | INTRAVENOUS | Status: DC

## 2015-10-07 MED ORDER — HEPARIN SODIUM (PORCINE) 5000 UNIT/ML IJ SOLN
INTRAMUSCULAR | Status: AC
  Filled 2015-10-07: qty 1

## 2015-10-07 MED ORDER — PAPAVERINE HCL 30 MG/ML IJ SOLN
INTRAMUSCULAR | Status: AC
  Filled 2015-10-07: qty 2

## 2015-10-07 MED ORDER — BUPIVACAINE HCL (PF) 0.5 % IJ SOLN
INTRAMUSCULAR | Status: AC
  Filled 2015-10-07: qty 30

## 2015-10-07 MED ORDER — SODIUM CHLORIDE 0.9 % IV SOLN: INTRAVENOUS | Status: DC

## 2015-10-10 ENCOUNTER — Emergency Department: Payer: Medicare Other

## 2015-10-10 ENCOUNTER — Emergency Department
Admission: EM | Admit: 2015-10-10 | Discharge: 2015-10-10 | Disposition: A | Discharge status: Home / Self Care | Payer: Medicare Other | Attending: Emergency Medicine | Admitting: Emergency Medicine

## 2015-10-10 ENCOUNTER — Encounter: Payer: Self-pay | Admitting: Emergency Medicine

## 2015-10-10 DIAGNOSIS — N189 Chronic kidney disease, unspecified: Secondary | ICD-10-CM | POA: Insufficient documentation

## 2015-10-10 DIAGNOSIS — M79642 Pain in left hand: Secondary | ICD-10-CM | POA: Diagnosis present

## 2015-10-10 DIAGNOSIS — Z7951 Long term (current) use of inhaled steroids: Secondary | ICD-10-CM | POA: Diagnosis not present

## 2015-10-10 DIAGNOSIS — Z87891 Personal history of nicotine dependence: Secondary | ICD-10-CM | POA: Diagnosis not present

## 2015-10-10 DIAGNOSIS — Z79899 Other long term (current) drug therapy: Secondary | ICD-10-CM | POA: Insufficient documentation

## 2015-10-10 DIAGNOSIS — I129 Hypertensive chronic kidney disease with stage 1 through stage 4 chronic kidney disease, or unspecified chronic kidney disease: Secondary | ICD-10-CM | POA: Insufficient documentation

## 2015-10-10 DIAGNOSIS — L03012 Cellulitis of left finger: Secondary | ICD-10-CM

## 2015-10-10 MED ORDER — OXYCODONE-ACETAMINOPHEN 5-325 MG PO TABS: 1.0000 | ORAL_TABLET | Freq: Once | ORAL | Status: DC

## 2015-10-10 MED ORDER — CEPHALEXIN 500 MG PO CAPS: 500.0000 mg | ORAL_CAPSULE | Freq: Four times a day (QID) | ORAL | Status: DC

## 2015-10-10 MED ORDER — OXYCODONE-ACETAMINOPHEN 5-325 MG PO TABS
1.0000 | ORAL_TABLET | Freq: Once | ORAL | Status: AC
  Administered 2015-10-10: 1 via ORAL

## 2015-10-10 MED ORDER — HYDROCODONE-ACETAMINOPHEN 5-325 MG PO TABS: 1.0000 | ORAL_TABLET | ORAL | Status: DC | PRN

## 2015-10-10 MED ORDER — OXYCODONE-ACETAMINOPHEN 5-325 MG PO TABS
ORAL_TABLET | ORAL | Status: AC
  Filled 2015-10-10: qty 1

## 2015-10-10 NOTE — Discharge Instructions (Signed)

## 2015-10-10 NOTE — ED Notes (Signed)
Had swelling to left 4 th finger   Denies any injury

## 2015-10-10 NOTE — ED Provider Notes (Signed)
[facility] Emergency Department Provider Note  ____________________________________________  Time seen: Approximately 10:17 AM  I have reviewed the triage vital signs and the nursing notes.   HISTORY  Chief Complaint Hand Pain    HPI Michele Nash is a 59 y.o. female who presents emergency department complaining of fourth finger pain to her left hand. She states that she has a history of ganglion cyst become infected and feels like this is a repeat of her symptoms. She has seen her primary care provider who states that he will not "cut it open." The patient states that the symptoms are severe, constant, not improved with over-the-counter medication. She denies any other complaints of fevers, chills, chest pain, shortness of breath, numbness or tingling in her hand or arm.   Past Medical History  Diagnosis Date  . Chronic kidney disease   . COPD (chronic obstructive pulmonary disease) ([place])   . Hypertension   . Hyperlipidemia   . Depression   . Dialysis patient ([place]) 2014  . Obesity   . Bronchitis   . Headache   . Apnea, sleep     for sleep study 10/17/15  . GERD (gastroesophageal reflux disease)   . Arthritis   . Anemia   . Renal dialysis device, implant, or graft complication     RIGHT CHEST CATH    Patient Active Problem List   Diagnosis Date Noted  . ESRD on hemodialysis ([place]) 03/29/2015  . HTN (hypertension) 03/29/2015  . COPD (chronic obstructive pulmonary disease) ([place]) 03/29/2015  . GAD (generalized anxiety disorder) 03/29/2015    Past Surgical History  Procedure Laterality Date  . Abdominal hysterectomy    . Dialysis fistula creation    . Colonoscopy  2011  . Peripheral vascular catheterization N/A 05/10/2015    Procedure: A/V Shuntogram/Fistulagram;  Surgeon: Katha Cabal, MD;  Location: [place] [facility];  Service: Cardiovascular;  Laterality: N/A;  . Peripheral vascular catheterization Left 05/10/2015    Procedure: A/V  Shunt Intervention;  Surgeon: Katha Cabal, MD;  Location: [place] [facility];  Service: Cardiovascular;  Laterality: Left;  . Peripheral vascular catheterization Left 08/23/2015    Procedure: A/V Shuntogram/Fistulagram;  Surgeon: Katha Cabal, MD;  Location: [place] [facility];  Service: Cardiovascular;  Laterality: Left;  . Peripheral vascular catheterization N/A 08/23/2015    Procedure: A/V Shunt Intervention;  Surgeon: Katha Cabal, MD;  Location: [place] [facility];  Service: Cardiovascular;  Laterality: N/A;  . Peripheral vascular catheterization  08/23/2015    Procedure: Dialysis/Perma Catheter Insertion;  Surgeon: Katha Cabal, MD;  Location: [place] [facility];  Service: Cardiovascular;;  . Colonoscopy with propofol N/A 09/20/2015    Procedure: COLONOSCOPY WITH PROPOFOL;  Surgeon: Lucilla Lame, MD;  Location: [facility];  Service: Endoscopy;  Laterality: N/A;    Current Outpatient Rx  Name  Route  Sig  Dispense  Refill  . albuterol (PROVENTIL HFA;VENTOLIN HFA) 108 (90 BASE) MCG/ACT inhaler   Inhalation   Inhale 2 puffs into the lungs every 6 (six) hours as needed for wheezing or shortness of breath.         . ALPRAZolam (XANAX) 0.5 MG tablet   Oral   Take 0.5 mg by mouth 2 (two) times daily.         Marland Kitchen amLODipine (NORVASC) 10 MG tablet   Oral   Take 1 tablet (10 mg total) by mouth daily.   30 tablet   1   . calcium  carbonate (OS-CAL) 600 MG TABS tablet   Oral   Take 600 mg by mouth 2 (two) times daily with a meal.         . carvedilol (COREG) 12.5 MG tablet   Oral   Take 12.5 mg by mouth daily.          . cephALEXin (KEFLEX) 500 MG capsule   Oral   Take 1 capsule (500 mg total) by mouth 4 (four) times daily.   28 capsule   0   . cinacalcet (SENSIPAR) 30 MG tablet   Oral   Take 30 mg by mouth daily.         Marland Kitchen ezetimibe (ZETIA) 10 MG tablet   Oral   Take 10 mg by mouth daily.         . fluticasone (VERAMYST) 27.5 MCG/SPRAY  nasal spray   Nasal   Place 2 sprays into the nose daily.         . Fluticasone-Salmeterol (ADVAIR) 100-50 MCG/DOSE AEPB   Inhalation   Inhale 1 puff into the lungs 2 (two) times daily.         . furosemide (LASIX) 40 MG tablet   Oral   Take 40 mg by mouth.         Marland Kitchen HYDROcodone-acetaminophen (NORCO/VICODIN) 5-325 MG tablet   Oral   Take 1 tablet by mouth every 4 (four) hours as needed for moderate pain.   20 tablet   0   . losartan (COZAAR) 100 MG tablet   Oral   Take 100 mg by mouth. On non-dialysis days         . multivitamin (RENA-VIT) TABS tablet   Oral   Take 1 tablet by mouth daily.         Marland Kitchen omeprazole (PRILOSEC) 20 MG capsule   Oral   Take 20 mg by mouth daily.         . sertraline (ZOLOFT) 50 MG tablet   Oral   Take 50 mg by mouth daily.           Allergies Sulfa antibiotics  Family History  Problem Relation Age of Onset  . Heart disease Mother   . Cancer Father   . Cancer Sister     Social History Social History  Substance Use Topics  . Smoking status: Former Smoker -- 0.00 packs/day    Quit date: 11/23/2014  . Smokeless tobacco: Never Used  . Alcohol Use: No    Review of Systems Constitutional: No fever/chills Eyes: No visual changes. ENT: No sore throat. Cardiovascular: Denies chest pain. Respiratory: Denies shortness of breath. Gastrointestinal: No abdominal pain.  No nausea, no vomiting.  No diarrhea.  No constipation. Genitourinary: Negative for dysuria. Musculoskeletal: Negative for back pain. Forefinger pain left hand. Skin: Negative for rash. Neurological: Negative for headaches, focal weakness or numbness.  10-point ROS otherwise negative.  ____________________________________________   PHYSICAL EXAM:  VITAL SIGNS: ED Triage Vitals  Enc Vitals Group     BP 10/10/15 0915 189/91 mmHg     Pulse Rate 10/10/15 0915 84     Resp 10/10/15 0915 24     Temp 10/10/15 0915 99.2 F (37.3 C)     Temp Source 10/10/15  0915 Oral     SpO2 10/10/15 0915 96 %     Weight 10/10/15 0915 146 lb (66.225 kg)     Height 10/10/15 0915 5\' 5"  (1.651 m)     Head Cir --      Peak  Flow --      Pain Score --      Pain Loc --      Pain Edu? --      Excl. in [place]? --     Constitutional: Alert and oriented. Well appearing and in no acute distress. Eyes: Conjunctivae are normal. PERRL. EOMI. Head: Atraumatic. Nose: No congestion/rhinnorhea. Mouth/Throat: Mucous membranes are moist.  Oropharynx non-erythematous. Neck: No stridor.   Cardiovascular: Normal rate, regular rhythm. Grossly normal heart sounds.  Good peripheral circulation. Respiratory: Normal respiratory effort.  No retractions. Lungs CTAB. Gastrointestinal: Soft and nontender. No distention. No abdominal bruits. No CVA tenderness. Musculoskeletal: No lower extremity tenderness nor edema.  No joint effusions. Neurologic:  Normal speech and language. No gross focal neurologic deficits are appreciated. No gait instability. Skin:  Skin is warm, dry and intact. No rash noted. Edema noted to left fourth finger. Minor erosion noted over PIP joint. No drainage or discharge. No fluctuance. Full range of motion to finger. Sensation and cap refill intact. Psychiatric: Mood and affect are normal. Speech and behavior are normal.  ____________________________________________   LABS (all labs ordered are listed, but only abnormal results are displayed)  Labs Reviewed - No data to display ____________________________________________  EKG   ____________________________________________  RADIOLOGY  Fourth finger x-ray Impression: Soft tissue swelling. No acute abnormality is identified. ____________________________________________   PROCEDURES  Procedure(s) performed: None  Critical Care performed: No  ____________________________________________   INITIAL IMPRESSION / ASSESSMENT AND PLAN / ED COURSE  Pertinent labs & imaging results that were available  during my care of the patient were reviewed by me and considered in my medical decision making (see chart for details).  Patient's history, symptoms, physical exam are taken into consideration for diagnosis. Advised patient of findings of cellulitis. There was no fluctuance so area was not incised or drained. Patient verbalizes understanding of the diagnosis. I will send the patient home with pain medication and antibiotics. She verbalizes understanding of same. Patient verbalizes compliance with treatment plan. ____________________________________________   FINAL CLINICAL IMPRESSION(S) / ED DIAGNOSES  Final diagnoses:  Cellulitis of finger of left hand      Darletta Moll, PA-C 10/10/15 1131  Lisa Roca, MD 10/10/15 1540

## 2015-10-17 ENCOUNTER — Ambulatory Visit: Payer: Medicare Other | Attending: Otolaryngology

## 2015-10-17 DIAGNOSIS — G4761 Periodic limb movement disorder: Secondary | ICD-10-CM | POA: Insufficient documentation

## 2015-10-17 DIAGNOSIS — G4733 Obstructive sleep apnea (adult) (pediatric): Secondary | ICD-10-CM | POA: Insufficient documentation

## 2015-10-18 ENCOUNTER — Other Ambulatory Visit: Payer: Self-pay | Admitting: Vascular Surgery

## 2015-10-25 ENCOUNTER — Inpatient Hospital Stay: Admission: RE | Admit: 2015-10-25 | Payer: Medicare Other | Source: Ambulatory Visit

## 2015-10-25 ENCOUNTER — Encounter: Payer: Self-pay | Admitting: *Deleted

## 2015-10-25 NOTE — Patient Instructions (Signed)
  Your procedure is scheduled on: 10-28-15 (FRIDAY) Report to [facility] To find out your arrival time please call [phone] between 1PM - 3PM on 10-27-15 (THURSDAY)  Remember: Instructions that are not followed completely may result in serious medical risk, up to and including death, or upon the discretion of your surgeon and anesthesiologist your surgery may need to be rescheduled.    _X___ 1. Do not eat food or drink liquids after midnight. No gum chewing or hard candies.     _X___ 2. No Alcohol for 24 hours before or after surgery.   ____ 3. Bring all medications with you on the day of surgery if instructed.    _X___ 4. Notify your doctor if there is any change in your medical condition     (cold, fever, infections).     Do not wear jewelry, make-up, hairpins, clips or nail polish.  Do not wear lotions, powders, or perfumes. You may wear deodorant.  Do not shave 48 hours prior to surgery. Men may shave face and neck.  Do not bring valuables to the hospital.    [facility] is not responsible for any belongings or valuables.               Contacts, dentures or bridgework may not be worn into surgery.  Leave your suitcase in the car. After surgery it may be brought to your room.  For patients admitted to the hospital, discharge time is determined by your treatment team.   Patients discharged the day of surgery will not be allowed to drive home.   Please read over the following fact sheets that you were given:     _X___ Take these medicines the morning of surgery with A SIP OF WATER:    1. CARVEDILOL (COREG)  2. ZETIA  3. LOSARTAN (COZAAR)  4. SERTRALINE (ZOLOFT)  5. PRILOSEC  6. TAKE AN EXTRA PRILOSEC ON Thursday NIGHT  ____ Fleet Enema (as directed)   _X___ Use CHG Soap as directed  _X___ Use inhalers on the day of surgery-USE ALBUTEROL INHALER AT [place]   ____ Stop metformin 2 days prior to surgery    ____ Take 1/2  of usual insulin dose the night before surgery and none on the morning of surgery.   ____ Stop Coumadin/Plavix/aspirin-N/A  ____ Stop Anti-inflammatories-NO NSAIDS OR ASPIRIN PRODUCTS-TYLENOL OK   ____ Stop supplements until after surgery.    ____ Bring C-Pap to the hospital.

## 2015-10-27 ENCOUNTER — Encounter
Admission: RE | Admit: 2015-10-27 | Discharge: 2015-10-27 | Disposition: A | Discharge status: Home / Self Care | Payer: Medicare Other | Source: Ambulatory Visit | Attending: Vascular Surgery | Admitting: Vascular Surgery

## 2015-10-27 DIAGNOSIS — Z01818 Encounter for other preprocedural examination: Secondary | ICD-10-CM

## 2015-10-27 LAB — CBC WITH DIFFERENTIAL/PLATELET
BASOS ABS: 0.1 10*3/uL (ref 0–0.1)
Basophils Relative: 1 %
EOS ABS: 0.2 10*3/uL (ref 0–0.7)
EOS PCT: 2 %
HCT: 31.5 % — ABNORMAL LOW (ref 35.0–47.0)
Hemoglobin: 10.3 g/dL — ABNORMAL LOW (ref 12.0–16.0)
Lymphocytes Relative: 16 %
Lymphs Abs: 1.2 10*3/uL (ref 1.0–3.6)
MCH: 32.2 pg (ref 26.0–34.0)
MCHC: 32.8 g/dL (ref 32.0–36.0)
MCV: 98.4 fL (ref 80.0–100.0)
Monocytes Absolute: 0.8 10*3/uL (ref 0.2–0.9)
Monocytes Relative: 11 %
Neutro Abs: 5.4 10*3/uL (ref 1.4–6.5)
Neutrophils Relative %: 70 %
PLATELETS: 288 10*3/uL (ref 150–440)
RBC: 3.21 MIL/uL — AB (ref 3.80–5.20)
RDW: 14.7 % — AB (ref 11.5–14.5)
WBC: 7.7 10*3/uL (ref 3.6–11.0)

## 2015-10-27 LAB — TYPE AND SCREEN
ABO/RH(D): O POS
ANTIBODY SCREEN: NEGATIVE

## 2015-10-27 LAB — SURGICAL PCR SCREEN
MRSA, PCR: NEGATIVE
STAPHYLOCOCCUS AUREUS: NEGATIVE

## 2015-10-27 LAB — BASIC METABOLIC PANEL
Anion gap: 9 (ref 5–15)
BUN: 30 mg/dL — AB (ref 6–20)
CO2: 29 mmol/L (ref 22–32)
CREATININE: 6.69 mg/dL — AB (ref 0.44–1.00)
Calcium: 9 mg/dL (ref 8.9–10.3)
Chloride: 102 mmol/L (ref 101–111)
GFR calc Af Amer: 7 mL/min — ABNORMAL LOW (ref >60)
GFR, EST NON AFRICAN AMERICAN: 6 mL/min — AB (ref >60)
Glucose, Bld: 101 mg/dL — ABNORMAL HIGH (ref 65–99)
Potassium: 5 mmol/L (ref 3.5–5.1)
SODIUM: 140 mmol/L (ref 135–145)

## 2015-10-27 LAB — PROTIME-INR
INR: 1.03
PROTHROMBIN TIME: 13.7 s (ref 11.4–15.0)

## 2015-10-27 LAB — APTT: APTT: 29 s (ref 24–36)

## 2015-10-28 ENCOUNTER — Observation Stay: Payer: Medicare Other

## 2015-10-28 ENCOUNTER — Encounter
Admission: RE | Disposition: A | Discharge status: Home / Self Care | Payer: Self-pay | Source: Ambulatory Visit | Attending: Internal Medicine

## 2015-10-28 ENCOUNTER — Ambulatory Visit: Payer: Medicare Other | Admitting: Anesthesiology

## 2015-10-28 ENCOUNTER — Inpatient Hospital Stay
Admission: RE | Admit: 2015-10-28 | Discharge: 2015-10-30 | DRG: 252 | Disposition: A | Discharge status: Home / Self Care | Payer: Medicare Other | Source: Ambulatory Visit | Attending: Internal Medicine | Admitting: Internal Medicine

## 2015-10-28 ENCOUNTER — Encounter: Payer: Self-pay | Admitting: *Deleted

## 2015-10-28 DIAGNOSIS — N186 End stage renal disease: Secondary | ICD-10-CM | POA: Diagnosis present

## 2015-10-28 DIAGNOSIS — I132 Hypertensive heart and chronic kidney disease with heart failure and with stage 5 chronic kidney disease, or end stage renal disease: Secondary | ICD-10-CM | POA: Diagnosis present

## 2015-10-28 DIAGNOSIS — N2581 Secondary hyperparathyroidism of renal origin: Secondary | ICD-10-CM | POA: Diagnosis present

## 2015-10-28 DIAGNOSIS — J449 Chronic obstructive pulmonary disease, unspecified: Secondary | ICD-10-CM | POA: Diagnosis present

## 2015-10-28 DIAGNOSIS — E11649 Type 2 diabetes mellitus with hypoglycemia without coma: Secondary | ICD-10-CM | POA: Diagnosis present

## 2015-10-28 DIAGNOSIS — K219 Gastro-esophageal reflux disease without esophagitis: Secondary | ICD-10-CM | POA: Diagnosis present

## 2015-10-28 DIAGNOSIS — Y839 Surgical procedure, unspecified as the cause of abnormal reaction of the patient, or of later complication, without mention of misadventure at the time of the procedure: Secondary | ICD-10-CM | POA: Diagnosis present

## 2015-10-28 DIAGNOSIS — E1122 Type 2 diabetes mellitus with diabetic chronic kidney disease: Secondary | ICD-10-CM | POA: Diagnosis present

## 2015-10-28 DIAGNOSIS — R229 Localized swelling, mass and lump, unspecified: Secondary | ICD-10-CM | POA: Diagnosis present

## 2015-10-28 DIAGNOSIS — M79609 Pain in unspecified limb: Secondary | ICD-10-CM | POA: Insufficient documentation

## 2015-10-28 DIAGNOSIS — Z992 Dependence on renal dialysis: Secondary | ICD-10-CM

## 2015-10-28 DIAGNOSIS — Z809 Family history of malignant neoplasm, unspecified: Secondary | ICD-10-CM

## 2015-10-28 DIAGNOSIS — T82898A Other specified complication of vascular prosthetic devices, implants and grafts, initial encounter: Principal | ICD-10-CM | POA: Diagnosis present

## 2015-10-28 DIAGNOSIS — J9601 Acute respiratory failure with hypoxia: Secondary | ICD-10-CM | POA: Diagnosis not present

## 2015-10-28 DIAGNOSIS — I5032 Chronic diastolic (congestive) heart failure: Secondary | ICD-10-CM | POA: Diagnosis present

## 2015-10-28 DIAGNOSIS — R0902 Hypoxemia: Secondary | ICD-10-CM

## 2015-10-28 DIAGNOSIS — M6740 Ganglion, unspecified site: Secondary | ICD-10-CM | POA: Diagnosis present

## 2015-10-28 DIAGNOSIS — F17200 Nicotine dependence, unspecified, uncomplicated: Secondary | ICD-10-CM | POA: Diagnosis present

## 2015-10-28 DIAGNOSIS — G4730 Sleep apnea, unspecified: Secondary | ICD-10-CM | POA: Diagnosis present

## 2015-10-28 DIAGNOSIS — I10 Essential (primary) hypertension: Secondary | ICD-10-CM

## 2015-10-28 DIAGNOSIS — D631 Anemia in chronic kidney disease: Secondary | ICD-10-CM | POA: Diagnosis present

## 2015-10-28 DIAGNOSIS — E669 Obesity, unspecified: Secondary | ICD-10-CM | POA: Diagnosis present

## 2015-10-28 DIAGNOSIS — Z8249 Family history of ischemic heart disease and other diseases of the circulatory system: Secondary | ICD-10-CM

## 2015-10-28 HISTORY — PX: REVISON OF ARTERIOVENOUS FISTULA: SHX6074

## 2015-10-28 HISTORY — PX: WOUND DEBRIDEMENT: SHX247

## 2015-10-28 LAB — POCT I-STAT 4, (NA,K, GLUC, HGB,HCT)
Glucose, Bld: 105 mg/dL — ABNORMAL HIGH (ref 65–99)
HCT: 34 % — ABNORMAL LOW (ref 36.0–46.0)
Hemoglobin: 11.6 g/dL — ABNORMAL LOW (ref 12.0–15.0)
Potassium: 4.4 mmol/L (ref 3.5–5.1)
Sodium: 139 mmol/L (ref 135–145)

## 2015-10-28 LAB — GLUCOSE, CAPILLARY
GLUCOSE-CAPILLARY: 147 mg/dL — AB (ref 65–99)
Glucose-Capillary: 121 mg/dL — ABNORMAL HIGH (ref 65–99)

## 2015-10-28 SURGERY — REVISON OF ARTERIOVENOUS FISTULA
Anesthesia: General | Laterality: Left | Wound class: Clean

## 2015-10-28 MED ORDER — AMLODIPINE BESYLATE 10 MG PO TABS: 10.0000 mg | ORAL_TABLET | Freq: Every day | ORAL | Status: DC

## 2015-10-28 MED ORDER — SERTRALINE HCL 50 MG PO TABS
50.0000 mg | ORAL_TABLET | ORAL | Status: DC
  Administered 2015-10-29 – 2015-10-30 (×2): 50 mg via ORAL
  Filled 2015-10-28 (×2): qty 1

## 2015-10-28 MED ORDER — SODIUM CHLORIDE 0.9 % IV SOLN
10000.0000 ug | INTRAVENOUS | Status: DC | PRN
  Administered 2015-10-28: 10 ug via INTRAVENOUS

## 2015-10-28 MED ORDER — SODIUM CHLORIDE 0.9 % IV SOLN: 250.0000 mL | INTRAVENOUS | Status: DC | PRN

## 2015-10-28 MED ORDER — INSULIN ASPART 100 UNIT/ML ~~LOC~~ SOLN
0.0000 [IU] | Freq: Three times a day (TID) | SUBCUTANEOUS | Status: DC
  Administered 2015-10-29: 2 [IU] via SUBCUTANEOUS
  Administered 2015-10-30: 3 [IU] via SUBCUTANEOUS
  Filled 2015-10-28: qty 3
  Filled 2015-10-28: qty 2

## 2015-10-28 MED ORDER — HEPARIN SODIUM (PORCINE) 5000 UNIT/ML IJ SOLN
INTRAMUSCULAR | Status: AC
  Filled 2015-10-28: qty 1

## 2015-10-28 MED ORDER — FENTANYL CITRATE (PF) 100 MCG/2ML IJ SOLN
INTRAMUSCULAR | Status: DC | PRN
  Administered 2015-10-28 (×3): 50 ug via INTRAVENOUS

## 2015-10-28 MED ORDER — ONDANSETRON HCL 4 MG/2ML IJ SOLN
INTRAMUSCULAR | Status: DC | PRN
  Administered 2015-10-28: 4 mg via INTRAVENOUS

## 2015-10-28 MED ORDER — IPRATROPIUM-ALBUTEROL 0.5-2.5 (3) MG/3ML IN SOLN
3.0000 mL | Freq: Four times a day (QID) | RESPIRATORY_TRACT | Status: DC
  Administered 2015-10-28 (×2): 3 mL via RESPIRATORY_TRACT

## 2015-10-28 MED ORDER — FENTANYL CITRATE (PF) 100 MCG/2ML IJ SOLN
25.0000 ug | INTRAMUSCULAR | Status: DC | PRN
  Administered 2015-10-28 (×2): 25 ug via INTRAVENOUS

## 2015-10-28 MED ORDER — SODIUM CHLORIDE 0.9 % IJ SOLN
INTRAMUSCULAR | Status: AC
  Filled 2015-10-28: qty 20

## 2015-10-28 MED ORDER — LABETALOL HCL 5 MG/ML IV SOLN
INTRAVENOUS | Status: AC
  Administered 2015-10-28: 5 mg via INTRAVENOUS
  Filled 2015-10-28: qty 4

## 2015-10-28 MED ORDER — INSULIN ASPART 100 UNIT/ML ~~LOC~~ SOLN
3.0000 [IU] | Freq: Three times a day (TID) | SUBCUTANEOUS | Status: DC
  Administered 2015-10-29 – 2015-10-30 (×2): 3 [IU] via SUBCUTANEOUS
  Filled 2015-10-28 (×3): qty 3

## 2015-10-28 MED ORDER — ONDANSETRON HCL 4 MG PO TABS: 4.0000 mg | ORAL_TABLET | Freq: Four times a day (QID) | ORAL | Status: DC | PRN

## 2015-10-28 MED ORDER — GUAIFENESIN ER 600 MG PO TB12
600.0000 mg | ORAL_TABLET | Freq: Two times a day (BID) | ORAL | Status: DC
Start: 2015-10-28 — End: 2015-10-30
  Administered 2015-10-28 – 2015-10-30 (×4): 600 mg via ORAL
  Filled 2015-10-28 (×4): qty 1

## 2015-10-28 MED ORDER — AMLODIPINE BESYLATE 10 MG PO TABS
10.0000 mg | ORAL_TABLET | Freq: Every day | ORAL | Status: DC
  Administered 2015-10-28 – 2015-10-30 (×2): 10 mg via ORAL
  Filled 2015-10-28 (×5): qty 1

## 2015-10-28 MED ORDER — ONDANSETRON HCL 4 MG/2ML IJ SOLN: 4.0000 mg | Freq: Four times a day (QID) | INTRAMUSCULAR | Status: DC | PRN

## 2015-10-28 MED ORDER — LOSARTAN POTASSIUM 50 MG PO TABS
100.0000 mg | ORAL_TABLET | ORAL | Status: DC
  Administered 2015-10-29 – 2015-10-30 (×2): 100 mg via ORAL
  Filled 2015-10-28 (×2): qty 2

## 2015-10-28 MED ORDER — LIDOCAINE HCL (CARDIAC) 20 MG/ML IV SOLN
INTRAVENOUS | Status: DC | PRN
  Administered 2015-10-28: 100 mg via INTRAVENOUS

## 2015-10-28 MED ORDER — IPRATROPIUM-ALBUTEROL 0.5-2.5 (3) MG/3ML IN SOLN
RESPIRATORY_TRACT | Status: AC
  Administered 2015-10-28: 3 mL via RESPIRATORY_TRACT
  Filled 2015-10-28: qty 3

## 2015-10-28 MED ORDER — PANTOPRAZOLE SODIUM 40 MG PO TBEC
40.0000 mg | DELAYED_RELEASE_TABLET | Freq: Every day | ORAL | Status: DC
  Administered 2015-10-28 – 2015-10-30 (×3): 40 mg via ORAL
  Filled 2015-10-28 (×3): qty 1

## 2015-10-28 MED ORDER — HEPARIN SODIUM (PORCINE) 5000 UNIT/ML IJ SOLN
5000.0000 [IU] | Freq: Three times a day (TID) | INTRAMUSCULAR | Status: DC
  Filled 2015-10-28 (×3): qty 1

## 2015-10-28 MED ORDER — MORPHINE SULFATE (PF) 2 MG/ML IV SOLN: 2.0000 mg | INTRAVENOUS | Status: DC | PRN

## 2015-10-28 MED ORDER — CEFAZOLIN SODIUM-DEXTROSE 2-3 GM-% IV SOLR
INTRAVENOUS | Status: AC
  Administered 2015-10-28: 2 g via INTRAVENOUS
  Filled 2015-10-28: qty 50

## 2015-10-28 MED ORDER — PHENYLEPHRINE HCL 10 MG/ML IJ SOLN
INTRAMUSCULAR | Status: DC | PRN
  Administered 2015-10-28 (×2): 100 ug via INTRAVENOUS
  Administered 2015-10-28: 200 ug via INTRAVENOUS
  Administered 2015-10-28 (×2): 100 ug via INTRAVENOUS

## 2015-10-28 MED ORDER — BUPIVACAINE HCL (PF) 0.5 % IJ SOLN
INTRAMUSCULAR | Status: AC
  Filled 2015-10-28: qty 30

## 2015-10-28 MED ORDER — ACETAMINOPHEN 650 MG RE SUPP: 650.0000 mg | Freq: Four times a day (QID) | RECTAL | Status: DC | PRN

## 2015-10-28 MED ORDER — SODIUM CHLORIDE 0.9 % IJ SOLN: 3.0000 mL | INTRAMUSCULAR | Status: DC | PRN

## 2015-10-28 MED ORDER — SODIUM CHLORIDE 0.9 % IJ SOLN
INTRAMUSCULAR | Status: AC
  Filled 2015-10-28: qty 100

## 2015-10-28 MED ORDER — ALBUTEROL SULFATE (2.5 MG/3ML) 0.083% IN NEBU: 3.0000 mL | INHALATION_SOLUTION | Freq: Four times a day (QID) | RESPIRATORY_TRACT | Status: DC | PRN

## 2015-10-28 MED ORDER — FENTANYL CITRATE (PF) 100 MCG/2ML IJ SOLN
INTRAMUSCULAR | Status: AC
  Filled 2015-10-28: qty 2

## 2015-10-28 MED ORDER — ONDANSETRON HCL 4 MG/2ML IJ SOLN: 4.0000 mg | Freq: Once | INTRAMUSCULAR | Status: DC | PRN

## 2015-10-28 MED ORDER — SODIUM CHLORIDE 0.9 % IJ SOLN
3.0000 mL | Freq: Two times a day (BID) | INTRAMUSCULAR | Status: DC
  Administered 2015-10-29 (×3): 3 mL via INTRAVENOUS

## 2015-10-28 MED ORDER — RENA-VITE PO TABS
1.0000 | ORAL_TABLET | Freq: Every day | ORAL | Status: DC
  Administered 2015-10-28 – 2015-10-30 (×3): 1 via ORAL
  Filled 2015-10-28 (×3): qty 1

## 2015-10-28 MED ORDER — CARVEDILOL 12.5 MG PO TABS
12.5000 mg | ORAL_TABLET | ORAL | Status: DC
  Administered 2015-10-29 – 2015-10-30 (×2): 12.5 mg via ORAL
  Filled 2015-10-28 (×2): qty 1

## 2015-10-28 MED ORDER — LABETALOL HCL 5 MG/ML IV SOLN
5.0000 mg | INTRAVENOUS | Status: DC | PRN
  Administered 2015-10-28 (×2): 5 mg via INTRAVENOUS
  Filled 2015-10-28 (×3): qty 4

## 2015-10-28 MED ORDER — PROPOFOL 10 MG/ML IV BOLUS
INTRAVENOUS | Status: DC | PRN
  Administered 2015-10-28: 160 mg via INTRAVENOUS

## 2015-10-28 MED ORDER — EPHEDRINE SULFATE 50 MG/ML IJ SOLN
INTRAMUSCULAR | Status: DC | PRN
  Administered 2015-10-28: 10 mg via INTRAVENOUS

## 2015-10-28 MED ORDER — PAPAVERINE HCL 30 MG/ML IJ SOLN
INTRAMUSCULAR | Status: AC
  Filled 2015-10-28: qty 2

## 2015-10-28 MED ORDER — OXYCODONE HCL 5 MG PO TABS
5.0000 mg | ORAL_TABLET | ORAL | Status: DC | PRN
  Administered 2015-10-29: 5 mg via ORAL
  Filled 2015-10-28: qty 1

## 2015-10-28 MED ORDER — OXYCODONE-ACETAMINOPHEN 5-325 MG PO TABS: 1.0000 | ORAL_TABLET | Freq: Four times a day (QID) | ORAL | Status: DC | PRN

## 2015-10-28 MED ORDER — SODIUM CHLORIDE 0.9 % IJ SOLN
3.0000 mL | Freq: Two times a day (BID) | INTRAMUSCULAR | Status: DC
Start: 2015-10-28 — End: 2015-10-30
  Administered 2015-10-29 – 2015-10-30 (×2): 3 mL via INTRAVENOUS

## 2015-10-28 MED ORDER — SUGAMMADEX SODIUM 200 MG/2ML IV SOLN
INTRAVENOUS | Status: DC | PRN
  Administered 2015-10-28: 132.4 mg via INTRAVENOUS

## 2015-10-28 MED ORDER — OXYCODONE HCL 5 MG PO TABS
ORAL_TABLET | ORAL | Status: AC
  Administered 2015-10-28: 5 mg
  Filled 2015-10-28: qty 1

## 2015-10-28 MED ORDER — CEFAZOLIN SODIUM-DEXTROSE 2-3 GM-% IV SOLR
2.0000 g | INTRAVENOUS | Status: AC
  Administered 2015-10-28: 2 g via INTRAVENOUS

## 2015-10-28 MED ORDER — OXYCODONE HCL 5 MG PO TABS
5.0000 mg | ORAL_TABLET | Freq: Once | ORAL | Status: AC
  Administered 2015-10-28: 5 mg via ORAL
  Filled 2015-10-28 (×2): qty 1

## 2015-10-28 MED ORDER — SUCCINYLCHOLINE CHLORIDE 20 MG/ML IJ SOLN
INTRAMUSCULAR | Status: DC | PRN
  Administered 2015-10-28: 100 mg via INTRAVENOUS

## 2015-10-28 MED ORDER — ACETAMINOPHEN 325 MG PO TABS
650.0000 mg | ORAL_TABLET | Freq: Four times a day (QID) | ORAL | Status: DC | PRN
  Administered 2015-10-29 – 2015-10-30 (×2): 650 mg via ORAL
  Filled 2015-10-28 (×2): qty 2

## 2015-10-28 MED ORDER — CINACALCET HCL 30 MG PO TABS
30.0000 mg | ORAL_TABLET | Freq: Every day | ORAL | Status: DC
  Administered 2015-10-28 – 2015-10-30 (×3): 30 mg via ORAL
  Filled 2015-10-28 (×3): qty 1

## 2015-10-28 MED ORDER — FLUTICASONE FUROATE 27.5 MCG/SPRAY NA SUSP: 2.0000 | Freq: Every day | NASAL | Status: DC

## 2015-10-28 MED ORDER — FUROSEMIDE 40 MG PO TABS
40.0000 mg | ORAL_TABLET | Freq: Every day | ORAL | Status: DC
  Administered 2015-10-28 – 2015-10-29 (×2): 40 mg via ORAL
  Filled 2015-10-28 (×2): qty 1

## 2015-10-28 MED ORDER — PREDNISONE 50 MG PO TABS
50.0000 mg | ORAL_TABLET | Freq: Every day | ORAL | Status: DC
  Administered 2015-10-29 – 2015-10-30 (×2): 50 mg via ORAL
  Filled 2015-10-28 (×2): qty 1

## 2015-10-28 MED ORDER — ALPRAZOLAM 0.5 MG PO TABS
0.5000 mg | ORAL_TABLET | Freq: Two times a day (BID) | ORAL | Status: DC
  Administered 2015-10-28 – 2015-10-30 (×3): 0.5 mg via ORAL
  Filled 2015-10-28 (×4): qty 1

## 2015-10-28 MED ORDER — EZETIMIBE 10 MG PO TABS
10.0000 mg | ORAL_TABLET | ORAL | Status: DC
  Administered 2015-10-29 – 2015-10-30 (×2): 10 mg via ORAL
  Filled 2015-10-28 (×3): qty 1

## 2015-10-28 MED ORDER — MOMETASONE FURO-FORMOTEROL FUM 100-5 MCG/ACT IN AERO
2.0000 | INHALATION_SPRAY | Freq: Two times a day (BID) | RESPIRATORY_TRACT | Status: DC
  Administered 2015-10-28 – 2015-10-30 (×4): 2 via RESPIRATORY_TRACT
  Filled 2015-10-28: qty 8.8

## 2015-10-28 MED ORDER — IPRATROPIUM-ALBUTEROL 0.5-2.5 (3) MG/3ML IN SOLN
3.0000 mL | RESPIRATORY_TRACT | Status: DC
  Administered 2015-10-28 – 2015-10-30 (×9): 3 mL via RESPIRATORY_TRACT
  Filled 2015-10-28 (×11): qty 3

## 2015-10-28 MED ORDER — LACTATED RINGERS IV SOLN
INTRAVENOUS | Status: DC | PRN
  Administered 2015-10-28: 11:00:00 via INTRAVENOUS

## 2015-10-28 MED ORDER — SODIUM CHLORIDE 0.9 % IV SOLN
INTRAVENOUS | Status: DC
  Administered 2015-10-28: 10:00:00 via INTRAVENOUS

## 2015-10-28 MED ORDER — SODIUM CHLORIDE 0.9 % IV SOLN
INTRAVENOUS | Status: DC | PRN
  Administered 2015-10-28: 100 mL via INTRAMUSCULAR

## 2015-10-28 MED ORDER — FLUTICASONE PROPIONATE 50 MCG/ACT NA SUSP
2.0000 | Freq: Every day | NASAL | Status: DC
  Administered 2015-10-28 – 2015-10-30 (×3): 2 via NASAL
  Filled 2015-10-28: qty 16

## 2015-10-28 MED ORDER — CALCIUM CARBONATE 600 MG PO TABS
600.0000 mg | ORAL_TABLET | Freq: Two times a day (BID) | ORAL | Status: DC
  Filled 2015-10-28 (×3): qty 1

## 2015-10-28 MED ORDER — ROCURONIUM BROMIDE 100 MG/10ML IV SOLN
INTRAVENOUS | Status: DC | PRN
  Administered 2015-10-28: 10 mg via INTRAVENOUS
  Administered 2015-10-28: 20 mg via INTRAVENOUS

## 2015-10-28 SURGICAL SUPPLY — 69 items
APPLIER CLIP 11 MED OPEN (CLIP)
APPLIER CLIP 9.375 SM OPEN (CLIP)
BAG DECANTER FOR FLEXI CONT (MISCELLANEOUS) IMPLANT
BLADE SURG SZ11 CARB STEEL (BLADE) ×3 IMPLANT
BNDG COHESIVE 6X5 TAN STRL LF (GAUZE/BANDAGES/DRESSINGS) IMPLANT
BOOT SUTURE AID YELLOW STND (SUTURE) ×3 IMPLANT
BRUSH SCRUB 4% CHG (MISCELLANEOUS) ×3 IMPLANT
CANISTER SUCT 1200ML W/VALVE (MISCELLANEOUS) ×3 IMPLANT
CHLORAPREP W/TINT 26ML (MISCELLANEOUS) ×3 IMPLANT
CLIP APPLIE 11 MED OPEN (CLIP) IMPLANT
CLIP APPLIE 9.375 SM OPEN (CLIP) IMPLANT
DRESSING SURGICEL FIBRLLR 1X2 (HEMOSTASIS) ×1 IMPLANT
DRSG EMULSION OIL 3X3 NADH (GAUZE/BANDAGES/DRESSINGS) IMPLANT
DRSG OPSITE POSTOP 4X12 (GAUZE/BANDAGES/DRESSINGS) ×3 IMPLANT
DRSG SURGICEL FIBRILLAR 1X2 (HEMOSTASIS) ×3
DRSG VAC ATS MED SENSATRAC (GAUZE/BANDAGES/DRESSINGS) IMPLANT
ELECT CAUTERY BLADE 6.4 (BLADE) ×3 IMPLANT
GAUZE PETRO XEROFOAM 1X8 (MISCELLANEOUS) ×6 IMPLANT
GAUZE SPONGE 4X4 12PLY STRL (GAUZE/BANDAGES/DRESSINGS) IMPLANT
GAUZE STRETCH 2X75IN STRL (MISCELLANEOUS) IMPLANT
GEL ULTRASOUND 20GR AQUASONIC (MISCELLANEOUS) IMPLANT
GLOVE SURG SYN 8.0 (GLOVE) ×24 IMPLANT
GOWN STRL REUS W/ TWL LRG LVL3 (GOWN DISPOSABLE) ×3 IMPLANT
GOWN STRL REUS W/ TWL XL LVL3 (GOWN DISPOSABLE) ×1 IMPLANT
GOWN STRL REUS W/TWL LRG LVL3 (GOWN DISPOSABLE) ×6
GOWN STRL REUS W/TWL XL LVL3 (GOWN DISPOSABLE) ×2
GRAFT COLLAGEN VASCULAR 8X45 (Miscellaneous) ×3 IMPLANT
HEMOSTAT SURGICEL 2X3 (HEMOSTASIS) IMPLANT
IV NS 500ML (IV SOLUTION) ×2
IV NS 500ML BAXH (IV SOLUTION) ×1 IMPLANT
KIT RM TURNOVER STRD PROC AR (KITS) ×3 IMPLANT
LABEL OR SOLS (LABEL) ×3 IMPLANT
LIQUID BAND (GAUZE/BANDAGES/DRESSINGS) ×3 IMPLANT
LOOP RED MAXI  1X406MM (MISCELLANEOUS) ×2
LOOP VESSEL MAXI 1X406 RED (MISCELLANEOUS) ×1 IMPLANT
LOOP VESSEL MINI 0.8X406 BLUE (MISCELLANEOUS) ×2 IMPLANT
LOOPS BLUE MINI 0.8X406MM (MISCELLANEOUS) ×4
NEEDLE FILTER BLUNT 18X 1/2SAF (NEEDLE) ×2
NEEDLE FILTER BLUNT 18X1 1/2 (NEEDLE) ×1 IMPLANT
NEEDLE HYPO 30X.5 LL (NEEDLE) IMPLANT
NS IRRIG 500ML POUR BTL (IV SOLUTION) ×3 IMPLANT
PACK EXTREMITY ARMC (MISCELLANEOUS) ×3 IMPLANT
PAD GROUND ADULT SPLIT (MISCELLANEOUS) ×3 IMPLANT
PAD PREP 24X41 OB/GYN DISP (PERSONAL CARE ITEMS) ×3 IMPLANT
PUNCH SURGICAL ROTATE 2.7MM (MISCELLANEOUS) IMPLANT
SOL PREP PVP 2OZ (MISCELLANEOUS)
SOLUTION PREP PVP 2OZ (MISCELLANEOUS) IMPLANT
SPONGE XRAY 4X4 16PLY STRL (MISCELLANEOUS) ×6 IMPLANT
STAPLER SKIN PROX 35W (STAPLE) ×3 IMPLANT
STOCKINETTE IMPERV 14X48 (MISCELLANEOUS) IMPLANT
STOCKINETTE STRL 4IN 9604848 (GAUZE/BANDAGES/DRESSINGS) ×3 IMPLANT
SUT ETHIBOND 0 36 GRN (SUTURE) ×3 IMPLANT
SUT MNCRL+ 5-0 UNDYED PC-3 (SUTURE) ×1 IMPLANT
SUT MONOCRYL 5-0 (SUTURE) ×2
SUT PROLENE 6 0 BV (SUTURE) ×18 IMPLANT
SUT SILK 2 0 (SUTURE) ×2
SUT SILK 2 0 SH (SUTURE) ×3 IMPLANT
SUT SILK 2-0 18XBRD TIE 12 (SUTURE) ×1 IMPLANT
SUT SILK 3 0 (SUTURE) ×2
SUT SILK 3-0 18XBRD TIE 12 (SUTURE) ×1 IMPLANT
SUT SILK 4 0 (SUTURE) ×2
SUT SILK 4-0 18XBRD TIE 12 (SUTURE) ×1 IMPLANT
SUT VIC AB 3-0 SH 27 (SUTURE) ×4
SUT VIC AB 3-0 SH 27X BRD (SUTURE) ×2 IMPLANT
SYR 20CC LL (SYRINGE) ×3 IMPLANT
SYR 3ML LL SCALE MARK (SYRINGE) ×3 IMPLANT
SYRINGE IRR TOOMEY STRL 70CC (SYRINGE) ×3 IMPLANT
TOWEL OR 17X26 4PK STRL BLUE (TOWEL DISPOSABLE) IMPLANT
WND VAC CANISTER 500ML (MISCELLANEOUS) IMPLANT

## 2015-10-28 NOTE — Transfer of Care (Signed)
Immediate Anesthesia Transfer of Care Note  Patient: Michele Nash  Procedure(s) Performed: Procedure(s): REVISON OF ARTERIOVENOUS FISTULA WITH ARTEGRAFT (Left) Resection of shoulder cyst ( left ) (Left)  Patient Location: PACU  Anesthesia Type:General  Level of Consciousness: sedated and responds to stimulation  Airway & Oxygen Therapy: Patient Spontanous Breathing and Patient connected to face mask oxygen  Post-op Assessment: Report given to RN and Post -op Vital signs reviewed and stable  Post vital signs: Reviewed and stable  Last Vitals:  Filed Vitals:   10/28/15 0914 10/28/15 1352  BP: 162/101 163/86  Pulse: 72 64  Temp: 36.7 C 36.2 C  Resp: 16 17    Complications: No apparent anesthesia complications

## 2015-10-28 NOTE — Anesthesia Preprocedure Evaluation (Signed)
Anesthesia Evaluation  Patient identified by MRN, date of birth, ID band Patient awake    Reviewed: Allergy & Precautions, NPO status , Patient's Chart, lab work & pertinent test results  History of Anesthesia Complications Negative for: history of anesthetic complications  Airway Mallampati: III       Dental  (+) Partial Upper, Partial Lower   Pulmonary sleep apnea , COPD,  COPD inhaler, former smoker,           Cardiovascular hypertension, Pt. on medications and Pt. on home beta blockers + Valvular Problems/Murmurs (no tx)      Neuro/Psych Depression    GI/Hepatic GERD  Medicated and Controlled,  Endo/Other  diabetes, Type 2  Renal/GU DialysisRenal disease     Musculoskeletal   Abdominal   Peds  Hematology  (+) anemia ,   Anesthesia Other Findings   Reproductive/Obstetrics                             Anesthesia Physical Anesthesia Plan  ASA: III  Anesthesia Plan: General   Post-op Pain Management:    Induction: Intravenous  Airway Management Planned: LMA  Additional Equipment:   Intra-op Plan:   Post-operative Plan:   Informed Consent: I have reviewed the patients History and Physical, chart, labs and discussed the procedure including the risks, benefits and alternatives for the proposed anesthesia with the patient or authorized representative who has indicated his/her understanding and acceptance.     Plan Discussed with:   Anesthesia Plan Comments:         Anesthesia Quick Evaluation

## 2015-10-28 NOTE — H&P (Signed)
[place] [facility] [place] NAME: Michele Nash    MR#:  [id number]  DATE OF BIRTH:  1956-02-22  DATE OF ADMISSION:  10/28/2015  PRIMARY CARE PHYSICIAN: [name], MD   REQUESTING/REFERRING PHYSICIAN:   CHIEF COMPLAINT:  No chief complaint on file.   HISTORY OF PRESENT ILLNESS: Michele Nash  is a 59 y.o. female with a known history of end-stage renal disease, pain in limb abnormal heart rhythm. Tobacco dependence, obesity, emphysema, hypertension who presents to the hospital for hemodialysis access revision. Patient underwent revision of left arm brachiocephalic fistula with resection of an aneurysmal segment. Postprocedure, she was complaining of significant left arm pain and was given 50 g of fentanyl with hypoxic episodes. She was initiated on oxygen therapy and had difficulty to be weaned off, she was given a few doses of DuoNeb nebs with no significant improvement. Chest x-ray is pending. Hospitalist services were contacted for admission of this patient for observation.   PAST MEDICAL HISTORY:   Past Medical History  Diagnosis Date  . Chronic kidney disease   . COPD (chronic obstructive pulmonary disease) ([place])   . Hypertension   . Hyperlipidemia   . Depression   . Dialysis patient ([place]) 2014  . Obesity   . Bronchitis   . Headache   . GERD (gastroesophageal reflux disease)   . Arthritis   . Anemia   . Renal dialysis device, implant, or graft complication     RIGHT CHEST CATH  . Apnea, sleep     for sleep study 10/17/15-no cpap yet  . Diabetes mellitus without complication ([place])     no meds    PAST SURGICAL HISTORY:  Past Surgical History  Procedure Laterality Date  . Abdominal hysterectomy    . Dialysis fistula creation    . Colonoscopy  2011  . Peripheral vascular catheterization N/A 05/10/2015    Procedure: A/V Shuntogram/Fistulagram;  Surgeon: Katha Cabal, MD;  Location: [place] [facility];  Service:  Cardiovascular;  Laterality: N/A;  . Peripheral vascular catheterization Left 05/10/2015    Procedure: A/V Shunt Intervention;  Surgeon: Katha Cabal, MD;  Location: [place] [facility];  Service: Cardiovascular;  Laterality: Left;  . Peripheral vascular catheterization Left 08/23/2015    Procedure: A/V Shuntogram/Fistulagram;  Surgeon: Katha Cabal, MD;  Location: [place] [facility];  Service: Cardiovascular;  Laterality: Left;  . Peripheral vascular catheterization N/A 08/23/2015    Procedure: A/V Shunt Intervention;  Surgeon: Katha Cabal, MD;  Location: [place] [facility];  Service: Cardiovascular;  Laterality: N/A;  . Peripheral vascular catheterization  08/23/2015    Procedure: Dialysis/Perma Catheter Insertion;  Surgeon: Katha Cabal, MD;  Location: [place] [facility];  Service: Cardiovascular;;  . Colonoscopy with propofol N/A 09/20/2015    Procedure: COLONOSCOPY WITH PROPOFOL;  Surgeon: Lucilla Lame, MD;  Location: [facility];  Service: Endoscopy;  Laterality: N/A;    SOCIAL HISTORY:  Social History  Substance Use Topics  . Smoking status: Former Smoker -- 0.50 packs/day for 40 years    Types: Cigarettes    Quit date: 11/23/2014  . Smokeless tobacco: Never Used  . Alcohol Use: No    FAMILY HISTORY:  Family History  Problem Relation Age of Onset  . Heart disease Mother   . Cancer Father   . Cancer Sister     DRUG ALLERGIES:  Allergies  Allergen Reactions  . Sulfa Antibiotics Swelling    Review of Systems  Constitutional: Negative for fever, chills, weight loss and malaise/fatigue.  HENT: Negative for congestion.   Eyes: Negative for blurred vision and double vision.  Respiratory: Positive for cough, sputum production, shortness of breath and wheezing.   Cardiovascular: Negative for chest pain, palpitations, orthopnea, leg swelling and PND.  Gastrointestinal: Negative for nausea, vomiting, abdominal pain, diarrhea, constipation, blood in  stool and melena.  Genitourinary: Negative for dysuria, urgency, frequency and hematuria.  Musculoskeletal: Negative for falls.  Skin: Negative for rash.  Neurological: Negative for dizziness and weakness.  Psychiatric/Behavioral: Negative for depression and memory loss. The patient is not nervous/anxious.   , shortness of breath, cough, chronic, patient admits of having some beers phlegm production, which is also chronic.   MEDICATIONS AT HOME:  Prior to Admission medications   Medication Sig Start Date End Date Taking? Authorizing Provider  albuterol (PROVENTIL HFA;VENTOLIN HFA) 108 (90 BASE) MCG/ACT inhaler Inhale 2 puffs into the lungs every 6 (six) hours as needed for wheezing or shortness of breath.   Yes Historical Provider, MD  ALPRAZolam Duanne Moron) 0.5 MG tablet Take 0.5 mg by mouth 2 (two) times daily.   Yes Historical Provider, MD  amLODipine (NORVASC) 10 MG tablet Take 1 tablet (10 mg total) by mouth daily. Patient taking differently: Take 10 mg by mouth at bedtime.  03/31/15  Yes Aldean Jewett, MD  calcium carbonate (OS-CAL) 600 MG TABS tablet Take 600 mg by mouth 2 (two) times daily with a meal.   Yes Historical Provider, MD  carvedilol (COREG) 12.5 MG tablet Take 12.5 mg by mouth every morning.    Yes Historical Provider, MD  cinacalcet (SENSIPAR) 30 MG tablet Take 30 mg by mouth daily.   Yes Historical Provider, MD  ezetimibe (ZETIA) 10 MG tablet Take 10 mg by mouth every morning.    Yes Historical Provider, MD  fluticasone (VERAMYST) 27.5 MCG/SPRAY nasal spray Place 2 sprays into the nose daily.   Yes Historical Provider, MD  furosemide (LASIX) 40 MG tablet Take 40 mg by mouth daily.    Yes Historical Provider, MD  losartan (COZAAR) 100 MG tablet Take 100 mg by mouth every morning. On non-dialysis days   Yes Historical Provider, MD  multivitamin (RENA-VIT) TABS tablet Take 1 tablet by mouth daily.   Yes Historical Provider, MD  omeprazole (PRILOSEC) 20 MG capsule Take 20 mg by  mouth every morning.    Yes Historical Provider, MD  sertraline (ZOLOFT) 50 MG tablet Take 50 mg by mouth every morning.    Yes Historical Provider, MD  cephALEXin (KEFLEX) 500 MG capsule Take 1 capsule (500 mg total) by mouth 4 (four) times daily. 10/10/15   Charline Bills Cuthriell, PA-C  Fluticasone-Salmeterol (ADVAIR) 100-50 MCG/DOSE AEPB Inhale 1 puff into the lungs 2 (two) times daily.    Historical Provider, MD  HYDROcodone-acetaminophen (NORCO/VICODIN) 5-325 MG tablet Take 1 tablet by mouth every 4 (four) hours as needed for moderate pain. 10/10/15   Charline Bills Cuthriell, PA-C  oxyCODONE-acetaminophen (ROXICET) 5-325 MG tablet Take 1-2 tablets by mouth every 6 (six) hours as needed for moderate pain or severe pain. 10/28/15   Katha Cabal, MD      PHYSICAL EXAMINATION:   VITAL SIGNS: Blood pressure 154/79, pulse 62, temperature 97.5 F (36.4 C), temperature source Oral, resp. rate 20, height 5\' 5"  (1.651 m), weight 66.225 kg (146 lb), SpO2 90 %.  GENERAL:  59 y.o.-year-old patient lying in the bed in moderate distress, crying intermittently due to left upper extremity  pain status post AV fistula revision. Marland Kitchen  EYES: Pupils equal, round, reactive to light and accommodation. No scleral icterus. Extraocular muscles intact.  HEENT: Head atraumatic, normocephalic. Oropharynx and nasopharynx clear.  NECK:  Supple, no jugular venous distention. No thyroid enlargement, no tenderness.  LUNGS: Markedly diminished breath sounds bilaterally anteriorly as well as posteriorly, few crackles posteriorly were noted, no wheezing, rales,rhonchi or crepitation. No use of accessory muscles of respiration.  CARDIOVASCULAR: S1, S2 normal. No murmurs, rubs, or gallops. Right upper chest hemodialysis catheter ABDOMEN: Soft, nontender, nondistended. Bowel sounds present. No organomegaly or mass.  EXTREMITIES: No pedal edema, cyanosis, or clubbing. Left upper extremity AV fistula has dressing with some bleeding on  it, but no active bleeding was noted. Significant discomfort on palpation NEUROLOGIC: Cranial nerves II through XII are intact. Muscle strength 5/5 in all extremities. Sensation intact. Gait not checked.  PSYCHIATRIC: The patient is alert and oriented x 3.  SKIN: No obvious rash, lesion, or ulcer.   LABORATORY PANEL:   CBC  Recent Labs Lab 10/27/15 1500 10/28/15 0940  WBC 7.7  --   HGB 10.3* 11.6*  HCT 31.5* 34.0*  PLT 288  --   MCV 98.4  --   MCH 32.2  --   MCHC 32.8  --   RDW 14.7*  --   LYMPHSABS 1.2  --   MONOABS 0.8  --   EOSABS 0.2  --   BASOSABS 0.1  --    ------------------------------------------------------------------------------------------------------------------  Chemistries   Recent Labs Lab 10/27/15 1500 10/28/15 0940  NA 140 139  K 5.0 4.4  CL 102  --   CO2 29  --   GLUCOSE 101* 105*  BUN 30*  --   CREATININE 6.69*  --   CALCIUM 9.0  --    ------------------------------------------------------------------------------------------------------------------  Cardiac Enzymes No results for input(s): TROPONINI in the last 168 hours. ------------------------------------------------------------------------------------------------------------------  RADIOLOGY: No results found.  EKG: Orders placed or performed during the hospital encounter of 05/01/15  . ED EKG  . ED EKG  . EKG    IMPRESSION AND PLAN:  Active Problems:   Acute respiratory failure with hypoxia (HCC)   Pain in limb   Essential hypertension   End stage renal disease (HCC)   Hypoxemia 1. Acute respiratory failure with hypoxia, likely due to opiate use in the setting of severe underlying COPD, continue oxygen therapy for now. Initiate duo nebs, Steroids, get chest x-ray and follow clinically,  2. End-stage renal disease, get nephrologist involved for further recommendations, patient is receiving hemodialysis via right upper chest hemodialysis catheter 3. Hypertension. Resume  blood pressure medications. Follow clinically 4. Pain in limb, continue patient on oxycodone for moderate pain and morphine at 2 mg for severe pain, watching patient's oxygenation closely   All the records are reviewed and case discussed with ED provider. Management plans discussed with the patient, family and they are in agreement.  CODE STATUS:    TOTAL TIME TAKING CARE OF THIS PATIENT: 50 minutes.    Theodoro Grist M.D on 10/28/2015 at 4:55 PM  Between 7am to 6pm - Pager - [phone] After 6pm go to [email or web address] - password [email or web address] [place] [facility]  Office  [phone]  CC: Primary care physician; [name], MD

## 2015-10-28 NOTE — Anesthesia Procedure Notes (Signed)
Procedure Name: Intubation Date/Time: 10/28/2015 11:19 AM Performed by: [facility] Pre-anesthesia Checklist: Patient identified, Emergency Drugs available, Suction available and Patient being monitored Patient Re-evaluated:Patient Re-evaluated prior to inductionOxygen Delivery Method: Ambu bag and Circle system utilized Preoxygenation: Pre-oxygenation with 100% oxygen Intubation Type: IV induction Ventilation: Mask ventilation without difficulty Laryngoscope Size: Mac and 3 Grade View: Grade I Tube type: Oral Tube size: 7.0 mm Number of attempts: 1 Airway Equipment and Method: Stylet Placement Confirmation: ETT inserted through vocal cords under direct vision,  positive ETCO2,  CO2 detector and breath sounds checked- equal and bilateral Secured at: 20 cm Tube secured with: Tape Dental Injury: Teeth and Oropharynx as per pre-operative assessment

## 2015-10-28 NOTE — Progress Notes (Signed)
Pt transferred back to the PACU. for increased BP and O2 desatuaration. Dr. Andree Elk notified for issues and wanted Labetalol 5 mg IV PRN to be given. Pt also desats to 73-81 % on Room Air as well and Dr. Andree Elk was notified of that as well. Pt is alert and talkative and is able to cough and deep breath. VS and orders assessed and will continue to monitor and treat pt according to Md orders. Report given to [facility], RN in PACU for pt handoff.

## 2015-10-28 NOTE — H&P (Signed)
[facility] Admission History & Physical  MRN : [id number]  Michele Nash is a 59 y.o. (January 05, 1956) female who presents with chief complaint of painful left arm AV fistula.  History of Present Illness: The patient presents to the hospital with increasing pain in her left arm secondary to her dialysis fistula. 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 She has a tunneled catheter at this time. She denies fever chills no nausea vomiting. The fistula has been present for many years with aneurysmal changes to become increasingly pronounced over the past 2-3 years. The painful symptoms are continuous but markedly increased with dialysis. Painful symptoms have been increasingly problematic over the past year.  She also notes a small painful nodule in the lateral pectoral area which she has requested to be excised at the time of her surgery.  Patient's concerns could be cancer.   Current Facility-Administered Medications  Medication Dose Route Frequency Provider Last Rate Last Dose  . 0.9 %  sodium chloride infusion   Intravenous Continuous Gijsbertus Lonia Mad, MD 50 mL/hr at 10/28/15 0941    . ceFAZolin (ANCEF) IVPB 2 g/50 mL premix  2 g Intravenous On Call to [place], PA-C   2 g at 10/28/15 1055  . ipratropium-albuterol (DUONEB) 0.5-2.5 (3) MG/3ML nebulizer solution 3 mL  3 mL Nebulization Q6H Gunnar Fusi, MD   3 mL at 10/28/15 [id number]    Past Medical History  Diagnosis Date  . Chronic kidney disease   . COPD (chronic obstructive pulmonary disease) ([place])   . Hypertension   . Hyperlipidemia   . Depression   . Dialysis patient ([place]) 2014  . Obesity   . Bronchitis   . Headache   . GERD (gastroesophageal reflux disease)   . Arthritis   . Anemia   . Renal dialysis device, implant, or graft complication     RIGHT CHEST CATH  . Apnea, sleep     for  sleep study 10/17/15-no cpap yet  . Diabetes mellitus without complication ([place])     no meds    Past Surgical History  Procedure Laterality Date  . Abdominal hysterectomy    . Dialysis fistula creation    . Colonoscopy  2011  . Peripheral vascular catheterization N/A 05/10/2015    Procedure: A/V Shuntogram/Fistulagram;  Surgeon: Katha Cabal, MD;  Location: [facility];  Service: Cardiovascular;  Laterality: N/A;  . Peripheral vascular catheterization Left 05/10/2015    Procedure: A/V Shunt Intervention;  Surgeon: Katha Cabal, MD;  Location: [facility];  Service: Cardiovascular;  Laterality: Left;  . Peripheral vascular catheterization Left 08/23/2015    Procedure: A/V Shuntogram/Fistulagram;  Surgeon: Katha Cabal, MD;  Location: [facility];  Service: Cardiovascular;  Laterality: Left;  . Peripheral vascular catheterization N/A 08/23/2015    Procedure: A/V Shunt Intervention;  Surgeon: Katha Cabal, MD;  Location: [place] [facility];  Service: Cardiovascular;  Laterality: N/A;  . Peripheral vascular catheterization  08/23/2015    Procedure: Dialysis/Perma Catheter Insertion;  Surgeon: Katha Cabal, MD;  Location: [facility];  Service: Cardiovascular;;  . Colonoscopy with propofol N/A 09/20/2015    Procedure: COLONOSCOPY WITH PROPOFOL;  Surgeon: Lucilla Lame, MD;  Location: [facility];  Service: Endoscopy;  Laterality: N/A;    Social History Social History  Substance Use Topics  . Smoking  status: Former Smoker -- 0.50 packs/day for 40 years    Types: Cigarettes    Quit date: 11/23/2014  . Smokeless tobacco: Never Used  . Alcohol Use: No    Family History Family History  Problem Relation Age of Onset  . Heart disease Mother   . Cancer Father   . Cancer Sister    no family history of porphyria, autoimmune disease or bleeding clotting disorders  Allergies  Allergen Reactions  . Sulfa Antibiotics Swelling      REVIEW OF SYSTEMS (Negative unless checked)  Constitutional: [] Weight loss  [] Fever  [] Chills Cardiac: [] Chest pain   [] Chest pressure   [] Palpitations   [] Shortness of breath when laying flat   [] Shortness of breath at rest   [] Shortness of breath with exertion. Vascular:  [] Pain in legs with walking   [] Pain in legs at rest   [] Pain in legs when laying flat   [] Claudication   [] Pain in feet when walking  [] Pain in feet at rest  [] Pain in feet when laying flat   [] History of DVT   [] Phlebitis   [] Swelling in legs   [] Varicose veins   [] Non-healing ulcers Pulmonary:   [] Uses home oxygen   [] Productive cough   [] Hemoptysis   [] Wheeze  [] COPD   [] Asthma Neurologic:  [] Dizziness  [] Blackouts   [] Seizures   [] History of stroke   [] History of TIA  [] Aphasia   [] Temporary blindness   [] Dysphagia   [] Weakness or numbness in arms   [] Weakness or numbness in legs Musculoskeletal:  [] Arthritis   [] Joint swelling   [] Joint pain   [] Low back pain Hematologic:  [] Easy bruising  [] Easy bleeding   [] Hypercoagulable state   [] Anemic  [] Hepatitis Gastrointestinal:  [] Blood in stool   [] Vomiting blood  [] Gastroesophageal reflux/heartburn   [] Difficulty swallowing. Genitourinary:  [] Chronic kidney disease   [] Difficult urination  [] Frequent urination  [] Burning with urination   [] Blood in urine Skin:  [] Rashes   [] Ulcers   [] Wounds Psychological:  [] History of anxiety   []  History of major depression.  Physical Examination  Filed Vitals:   10/27/15 0943 10/28/15 0914  BP:  162/101  Pulse:  72  Temp:  98 F (36.7 C)  TempSrc:  Oral  Resp:  16  Height: 5\' 5"  (1.651 m)   Weight: 66.225 kg (146 lb)   SpO2:  100%   Body mass index is 24.3 kg/(m^2). Gen: WD/WN, NAD Head: [place]/AT, No temporalis wasting.  Ear/Nose/Throat: Hearing grossly intact, nares w/o erythema or drainage, oropharynx w/o Erythema/Exudate, Eyes: PERRLA, EOMI.  Neck: Supple, no nuchal rigidity.  No bruit or JVD.  Pulmonary:  Good air  movement, clear to auscultation bilaterally, no increased work of respiration or use of accessory muscles. There is a small 4-5 mm multiple nontender firm nodule which is subcutaneous. Cardiac: RRR, normal S1, S2, no Murmurs, rubs or gallops. Vascular: Left brachiocephalic fistula is markedly aneurysmal with segments measuring 3 cm in diameter. It is tender to palpation. There is moderate pulsatility. There is a continuous thrill. Vessel Right Left  Radial Palpable  weakly Palpable  Ulnar Palpable  not Palpable  Brachial Palpable Palpable  Carotid Palpable, without bruit Palpable, without bruit  Aorta Not palpable N/A  Femoral Palpable Palpable  Popliteal Palpable Palpable  PT Palpable Palpable  DP Palpable Palpable   Gastrointestinal: soft, non-tender/non-distended. No guarding/reflex. No masses, surgical incisions, or scars. Musculoskeletal: M/S 5/5 throughout.  No deformity or atrophy. Neurologic: CN 2-12 intact. Pain and light touch intact in  extremities.  Symmetrical.  Speech is fluent. Motor exam as listed above. Psychiatric: Judgment intact, Mood & affect appropriate for pt's clinical situation. Dermatologic: No rashes or ulcers noted.  No cellulitis or open wounds. Lymph : No Cervical, Axillary, or Inguinal lymphadenopathy.    CBC Lab Results  Component Value Date   WBC 7.7 10/27/2015   HGB 11.6* 10/28/2015   HCT 34.0* 10/28/2015   MCV 98.4 10/27/2015   PLT 288 10/27/2015    BMET    Component Value Date/Time   NA 139 10/28/2015 0940   NA 141 10/10/2014 1122   K 4.4 10/28/2015 0940   K 3.7 10/10/2014 1122   CL 102 10/27/2015 1500   CL 96* 10/10/2014 1122   CO2 29 10/27/2015 1500   CO2 37* 10/10/2014 1122   GLUCOSE 105* 10/28/2015 0940   GLUCOSE 125* 10/10/2014 1122   BUN 30* 10/27/2015 1500   BUN 40* 10/10/2014 1122   CREATININE 6.69* 10/27/2015 1500   CREATININE 7.68* 10/10/2014 1122   CALCIUM 9.0 10/27/2015 1500   CALCIUM 9.0 10/10/2014 1122   GFRNONAA 6*  10/27/2015 1500   GFRNONAA 6* 10/10/2014 1122   GFRNONAA 6* 06/15/2014 0443   GFRAA 7* 10/27/2015 1500   GFRAA 7* 10/10/2014 1122   GFRAA 7* 06/15/2014 0443   Estimated Creatinine Clearance: 7.9 mL/min (by C-G formula based on Cr of 6.69).  COAG Lab Results  Component Value Date   INR 1.03 10/27/2015   INR 0.96 09/26/2015   INR 1.1 03/03/2013    Radiology Dg Finger Ring Left  10/10/2015  CLINICAL DATA:  Ganglion cyst on the left ring finger for 3 years with pain and swelling beginning this morning. Subsequent encounter. EXAM: LEFT RING FINGER 2+V COMPARISON:  Plain films of the left ring finger is 05/23/2015. FINDINGS: Soft tissue swelling of the left ring finger extending from the distal diaphysis of the proximal phalanx at the DIP joint does not appear markedly changed. Swelling is eccentric dorsally. No underlying acute bony or joint abnormality is seen. No bony destructive change or periostitis is identified. Mild degenerative change at the DIP joint is noted. 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 IMPRESSION: Soft tissue swelling about the dorsal and mid aspect of the left ring finger does not appear markedly changed compared to the prior study. No acute abnormality is identified. Electronically Signed   By: Inge Rise M.D.   On: 10/10/2015 11:20     Assessment/Plan 1.  Complication dialysis device with aneurysmal deterioration and increasing pain of her AV access:  Patient's left brachiocephalic A-V dialysis access is symptomatic and painful. The patient will undergo revision with Artegraft. The risks and benefits have been reviewed with the patient all questions answered patient agrees to proceed Potassium will be drawn to ensure that it is an appropriate level prior to performing surgery. 2.  End-stage renal disease requiring hemodialysis:  Patient will continue dialysis therapy without further interruption. She has a tunneled  dialysis catheter and dialysis has already been arranged since the patient missed their previous session 3.  Hypertension:  Patient will continue medical management; nephrology is following no changes in oral medications. 4. Diabetes mellitus:  Glucose will be monitored and oral medications been held this morning once the patient has undergone the patient's procedure po intake will be reinitiated and again Accu-Cheks will be used to assess the blood glucose level and treat as needed. The patient will be restarted on the patient's  usual hypoglycemic regime 6.  Small nodule subcutaneous left pectoral area: This will be excised at the time surgery and sent for pathology. The risks and benefits of been reviewed patient agrees to proceed   Delana Meyer, Dolores Lory, MD  10/28/2015 10:55 AM

## 2015-10-28 NOTE — Op Note (Signed)
OPERATIVE NOTE   PROCEDURE: 1.  Revision left arm brachiocephalic fistula with resection of aneurysmal segment 2.  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 Hezzie Bump  ANESTHESIA: general  ESTIMATED BLOOD LOSS: 150 cc  FINDING(S): Aneurysmal degeneration of the left arm fistula small firm nodule left shoulder  SPECIMEN(S):  Segment of aneurysmal fistula to pathology for permanent section also subcutaneous nodule pathology for permanent section  INDICATIONS:   Michele Nash is a 59 y.o. female who presents with problems with her fistula. It is now very painful and quite aneurysmal dialysis is having a hard time accessing the fistula and was experiencing prolonged bleeding and so she had a tunneled catheter placed proximal 6 weeks ago. She is now undergoing revision of her access. The rest benefits of been reviewed all questions of been answered patient agrees to proceed. In addition she is pointed out a nodule in her left shoulder area. It is tender to palpation. She is concerned this could be cancer and is therefore requested this be removed at the time of her surgery..  DESCRIPTION: After full informed written consent was obtained from the patient, the patient was brought back to the operating room and placed supine upon the operating table.  Prior to induction, the patient received IV antibiotics.   After obtaining adequate anesthesia, the patient was then prepped and draped in the standard fashion for a  revision of her left arm AV fistula.  Linear incision was then created overlying the aneurysmal fistula from the level of the anastomosis to the level of the deltoid  muscle. Skin and subcutaneous tissues are then incised and the tissues surrounding the fistula are then dissected using Bovie cautery and Metzenbaum scissors. Once the fistula over this length of been circumferentially dissected hemostasis was obtained with Bovie cautery once an adequate dry bed had been achieved a 7 mm Artegraft was opened and prepared on the back table.  The fistula was marked proximally and distally was then ligated with 0 Ethibond and transected. The specimen was passed off the field. The vascular clamp was then placed just above the arterial anastomosis and the vein at this level was beveled Artegraft was also beveled and an end-to-end anastomosis was fashioned with running 6-0 Prolene. Flushing maneuvers were performed and the anastomosis and graft pressurized. Was then approximated to the upper and and again both the Artegraft and the vein were beveled and then entered and anastomoses were fashioned using running 6-0 Prolene in a four-quadrant technique. Flushing maneuvers were performed and flow was established through the Artegraft excellent thrill was noted.  The wound was then again inspected for hemostasis once this had been achieved the subcutaneous tissues were reapproximated using interrupted and running 3-0 Vicryl subsequently the skin was then approximated and the redundant skin from the aneurysmal segments were resected with a 15 blade scalpel skin was then closed using staples.  Attention was then turned to the nodule where a 1-1/2 cm incision was created over the nodule dissection was carried down nodule was identified was then delivered into the field and excised with Bovie. Nodule passed off for specimen. Wound was then inspected hemostasis was achieved and was closed in layers using 3-0 Vicryl followed by 4-0 Monocryl subcuticular.  The patient tolerated  this procedure well.   COMPLICATIONS: None  CONDITION: [facility]   Office: [phone]   10/28/2015, 1:37 PM

## 2015-10-28 NOTE — H&P (Signed)
[facility] History & Physical Update  The patient was interviewed and re-examined.  The patient's previous History and Physical has been reviewed and is unchanged.  There is no change in the plan of care. We plan to proceed with the scheduled procedure.  Marvina Danner, Dolores Lory, MD  10/28/2015, 10:22 AM

## 2015-10-28 NOTE — Anesthesia Postprocedure Evaluation (Signed)
Anesthesia Post Note  Patient: Michele Nash  Procedure(s) Performed: Procedure(s) (LRB): REVISON OF ARTERIOVENOUS FISTULA WITH ARTEGRAFT (Left) Resection of shoulder cyst ( left ) (Left)  Patient location during evaluation: PACU Anesthesia Type: General Level of consciousness: awake and alert Pain management: pain level controlled Vital Signs Assessment: post-procedure vital signs reviewed and stable Respiratory status: spontaneous breathing and respiratory function stable Cardiovascular status: blood pressure returned to baseline and stable Anesthetic complications: no    Last Vitals:  Filed Vitals:   10/28/15 0914 10/28/15 1352  BP: 162/101 163/86  Pulse: 72 64  Temp: 36.7 C 36.2 C  Resp: 16 17    Last Pain:  Filed Vitals:   10/28/15 1407  PainSc: Asleep                 Ioane Bhola K

## 2015-10-29 ENCOUNTER — Encounter: Payer: Self-pay | Admitting: *Deleted

## 2015-10-29 DIAGNOSIS — I5032 Chronic diastolic (congestive) heart failure: Secondary | ICD-10-CM | POA: Diagnosis present

## 2015-10-29 DIAGNOSIS — M6740 Ganglion, unspecified site: Secondary | ICD-10-CM | POA: Diagnosis present

## 2015-10-29 DIAGNOSIS — N186 End stage renal disease: Secondary | ICD-10-CM | POA: Diagnosis present

## 2015-10-29 DIAGNOSIS — F17200 Nicotine dependence, unspecified, uncomplicated: Secondary | ICD-10-CM | POA: Diagnosis present

## 2015-10-29 DIAGNOSIS — D631 Anemia in chronic kidney disease: Secondary | ICD-10-CM | POA: Diagnosis present

## 2015-10-29 DIAGNOSIS — J9601 Acute respiratory failure with hypoxia: Secondary | ICD-10-CM | POA: Diagnosis not present

## 2015-10-29 DIAGNOSIS — K219 Gastro-esophageal reflux disease without esophagitis: Secondary | ICD-10-CM | POA: Diagnosis present

## 2015-10-29 DIAGNOSIS — R229 Localized swelling, mass and lump, unspecified: Secondary | ICD-10-CM | POA: Diagnosis present

## 2015-10-29 DIAGNOSIS — E669 Obesity, unspecified: Secondary | ICD-10-CM | POA: Diagnosis present

## 2015-10-29 DIAGNOSIS — N2581 Secondary hyperparathyroidism of renal origin: Secondary | ICD-10-CM | POA: Diagnosis present

## 2015-10-29 DIAGNOSIS — J9589 Other postprocedural complications and disorders of respiratory system, not elsewhere classified: Secondary | ICD-10-CM | POA: Diagnosis present

## 2015-10-29 DIAGNOSIS — Z809 Family history of malignant neoplasm, unspecified: Secondary | ICD-10-CM | POA: Diagnosis not present

## 2015-10-29 DIAGNOSIS — I132 Hypertensive heart and chronic kidney disease with heart failure and with stage 5 chronic kidney disease, or end stage renal disease: Secondary | ICD-10-CM | POA: Diagnosis present

## 2015-10-29 DIAGNOSIS — T82898A Other specified complication of vascular prosthetic devices, implants and grafts, initial encounter: Secondary | ICD-10-CM | POA: Diagnosis present

## 2015-10-29 DIAGNOSIS — Y839 Surgical procedure, unspecified as the cause of abnormal reaction of the patient, or of later complication, without mention of misadventure at the time of the procedure: Secondary | ICD-10-CM | POA: Diagnosis present

## 2015-10-29 DIAGNOSIS — Z8249 Family history of ischemic heart disease and other diseases of the circulatory system: Secondary | ICD-10-CM | POA: Diagnosis not present

## 2015-10-29 DIAGNOSIS — E11649 Type 2 diabetes mellitus with hypoglycemia without coma: Secondary | ICD-10-CM | POA: Diagnosis present

## 2015-10-29 DIAGNOSIS — G4730 Sleep apnea, unspecified: Secondary | ICD-10-CM | POA: Diagnosis present

## 2015-10-29 DIAGNOSIS — E1122 Type 2 diabetes mellitus with diabetic chronic kidney disease: Secondary | ICD-10-CM | POA: Diagnosis present

## 2015-10-29 DIAGNOSIS — J449 Chronic obstructive pulmonary disease, unspecified: Secondary | ICD-10-CM | POA: Diagnosis present

## 2015-10-29 LAB — GLUCOSE, CAPILLARY
GLUCOSE-CAPILLARY: 195 mg/dL — AB (ref 65–99)
GLUCOSE-CAPILLARY: 88 mg/dL (ref 65–99)
Glucose-Capillary: 118 mg/dL — ABNORMAL HIGH (ref 65–99)
Glucose-Capillary: 136 mg/dL — ABNORMAL HIGH (ref 65–99)

## 2015-10-29 LAB — CBC
HEMATOCRIT: 31.5 % — AB (ref 35.0–47.0)
Hemoglobin: 10 g/dL — ABNORMAL LOW (ref 12.0–16.0)
MCH: 32 pg (ref 26.0–34.0)
MCHC: 31.7 g/dL — AB (ref 32.0–36.0)
MCV: 100.9 fL — AB (ref 80.0–100.0)
Platelets: 248 10*3/uL (ref 150–440)
RBC: 3.12 MIL/uL — ABNORMAL LOW (ref 3.80–5.20)
RDW: 14.7 % — AB (ref 11.5–14.5)
WBC: 9.9 10*3/uL (ref 3.6–11.0)

## 2015-10-29 LAB — BASIC METABOLIC PANEL
Anion gap: 12 (ref 5–15)
BUN: 55 mg/dL — AB (ref 6–20)
CHLORIDE: 101 mmol/L (ref 101–111)
CO2: 27 mmol/L (ref 22–32)
Calcium: 8.1 mg/dL — ABNORMAL LOW (ref 8.9–10.3)
Creatinine, Ser: 10.7 mg/dL — ABNORMAL HIGH (ref 0.44–1.00)
GFR calc Af Amer: 4 mL/min — ABNORMAL LOW (ref >60)
GFR, EST NON AFRICAN AMERICAN: 3 mL/min — AB (ref >60)
GLUCOSE: 186 mg/dL — AB (ref 65–99)
POTASSIUM: 5.4 mmol/L — AB (ref 3.5–5.1)
Sodium: 140 mmol/L (ref 135–145)

## 2015-10-29 LAB — PHOSPHORUS: PHOSPHORUS: 4.7 mg/dL — AB (ref 2.5–4.6)

## 2015-10-29 MED ORDER — PENTAFLUOROPROP-TETRAFLUOROETH EX AERO
1.0000 "application " | INHALATION_SPRAY | CUTANEOUS | Status: DC | PRN
  Filled 2015-10-29: qty 30

## 2015-10-29 MED ORDER — HEPARIN SODIUM (PORCINE) 1000 UNIT/ML DIALYSIS
1000.0000 [IU] | INTRAMUSCULAR | Status: DC | PRN
  Filled 2015-10-29: qty 1

## 2015-10-29 MED ORDER — CALCIUM CARBONATE ANTACID 500 MG PO CHEW
1.0000 | CHEWABLE_TABLET | Freq: Two times a day (BID) | ORAL | Status: DC
  Administered 2015-10-29 – 2015-10-30 (×3): 200 mg via ORAL
  Filled 2015-10-29 (×3): qty 1

## 2015-10-29 MED ORDER — FUROSEMIDE 10 MG/ML IJ SOLN: 40.0000 mg | Freq: Two times a day (BID) | INTRAMUSCULAR | Status: DC

## 2015-10-29 MED ORDER — ALTEPLASE 2 MG IJ SOLR
2.0000 mg | Freq: Once | INTRAMUSCULAR | Status: DC | PRN
  Filled 2015-10-29: qty 2

## 2015-10-29 MED ORDER — SODIUM CHLORIDE 0.9 % IV SOLN: 100.0000 mL | INTRAVENOUS | Status: DC | PRN

## 2015-10-29 MED ORDER — LIDOCAINE HCL (PF) 1 % IJ SOLN
5.0000 mL | INTRAMUSCULAR | Status: DC | PRN
  Filled 2015-10-29: qty 5

## 2015-10-29 MED ORDER — FUROSEMIDE 10 MG/ML IJ SOLN
40.0000 mg | Freq: Two times a day (BID) | INTRAMUSCULAR | Status: DC
  Administered 2015-10-29 – 2015-10-30 (×2): 40 mg via INTRAVENOUS
  Filled 2015-10-29 (×3): qty 4

## 2015-10-29 MED ORDER — LIDOCAINE-PRILOCAINE 2.5-2.5 % EX CREA
1.0000 "application " | TOPICAL_CREAM | CUTANEOUS | Status: DC | PRN
  Filled 2015-10-29: qty 5

## 2015-10-29 NOTE — Progress Notes (Signed)
Pt refuses to have a chair alarm

## 2015-10-29 NOTE — Progress Notes (Signed)
Dr Lavetta Nielsen notified pt refused heparin and to have blood drawn

## 2015-10-29 NOTE — Progress Notes (Signed)
[place] at [facility]                                                                                                                                                                                            Patient Demographics   Michele Nash, is a 59 y.o. female, DOB - 1955-12-28, [id number]  Admit date - 10/28/2015   Admitting Physician Theodoro Grist, MD  Outpatient Primary MD for the patient is [name], MD   LOS -   Subjective: Continues to complain of pain in the left arm. Still requiring oxygen. Denies any chest pain     Review of Systems:   CONSTITUTIONAL: No documented fever. No fatigue, weakness. No weight gain, no weight loss.  EYES: No blurry or double vision.  ENT: No tinnitus. No postnasal drip. No redness of the oropharynx.  RESPIRATORY: No cough, no wheeze, no hemoptysis. Positive dyspnea.  CARDIOVASCULAR: No chest pain. No orthopnea. No palpitations. No syncope.  GASTROINTESTINAL: No nausea, no vomiting or diarrhea. No abdominal pain. No melena or hematochezia.  GENITOURINARY: No dysuria or hematuria.  ENDOCRINE: No polyuria or nocturia. No heat or cold intolerance.  HEMATOLOGY: No anemia. No bruising. No bleeding.  INTEGUMENTARY: No rashes. No lesions.  MUSCULOSKELETAL: No arthritis. No swelling. No gout. Left arm pain NEUROLOGIC: No numbness, tingling, or ataxia. No seizure-type activity.  PSYCHIATRIC: No anxiety. No insomnia. No ADD.    Vitals:   Filed Vitals:   10/29/15 0421 10/29/15 0645 10/29/15 0733 10/29/15 1258  BP:  141/71  133/67  Pulse:  73  71  Temp:  99.8 F (37.7 C)  98.9 F (37.2 C)  TempSrc:    Oral  Resp:  22  17  Height:      Weight:      SpO2: 96% 95% 95% 88%    Wt Readings from Last 3 Encounters:  10/27/15 66.225 kg (146 lb)  10/10/15 66.225 kg (146 lb)  09/20/15 65.772 kg (145 lb)     Intake/Output Summary (Last 24 hours) at 10/29/15 1348 Last data filed at 10/29/15 1300   Gross per 24 hour  Intake   1520 ml  Output   1350 ml  Net    170 ml    Physical Exam:   GENERAL: Pleasant-appearing in no apparent distress.  HEAD, EYES, EARS, NOSE AND THROAT: Atraumatic, normocephalic. Extraocular muscles are intact. Pupils equal and reactive to light. Sclerae anicteric. No conjunctival injection. No oro-pharyngeal erythema.  NECK: Supple. There is no jugular venous distention. No bruits, no lymphadenopathy, no thyromegaly.  HEART: Regular rate and rhythm,. No murmurs, no rubs,  no clicks.  LUNGS: Bilateral crackles at the bases  ABDOMEN: Soft, flat, nontender, nondistended. Has good bowel sounds. No hepatosplenomegaly appreciated.  EXTREMITIES: No evidence of any cyanosis, clubbing, or peripheral edema.  +2 pedal and radial pulses bilaterally.  NEUROLOGIC: The patient is alert, awake, and oriented x3 with no focal motor or sensory deficits appreciated bilaterally.  SKIN: Moist and warm with no rashes appreciated. Left arm has some bleeding at the site of the procedure bandage in place Psych: Not anxious, depressed LN: No inguinal LN enlargement    Antibiotics   Anti-infectives    Start     Dose/Rate Route Frequency Ordered Stop   10/28/15 0859  ceFAZolin (ANCEF) IVPB 2 g/50 mL premix     2 g 100 mL/hr over 30 Minutes Intravenous On call to O.R. 10/28/15 0859 10/28/15 1125      Medications   Scheduled Meds: . ALPRAZolam  0.5 mg Oral BID  . amLODipine  10 mg Oral Daily  . calcium carbonate  1 tablet Oral BID WC  . carvedilol  12.5 mg Oral BH-q7a  . cinacalcet  30 mg Oral Daily  . ezetimibe  10 mg Oral BH-q7a  . fluticasone  2 spray Each Nare Daily  . furosemide  40 mg Intravenous Q12H  . guaiFENesin  600 mg Oral BID  . heparin  5,000 Units Subcutaneous 3 times per day  . insulin aspart  0-9 Units Subcutaneous TID WC  . insulin aspart  3 Units Subcutaneous TID WC  . ipratropium-albuterol  3 mL Nebulization Q4H  . losartan  100 mg Oral BH-q7a  .  mometasone-formoterol  2 puff Inhalation BID  . multivitamin  1 tablet Oral Daily  . pantoprazole  40 mg Oral Daily  . predniSONE  50 mg Oral Q breakfast  . sertraline  50 mg Oral BH-q7a  . sodium chloride  3 mL Intravenous Q12H  . sodium chloride  3 mL Intravenous Q12H   Continuous Infusions:  PRN Meds:.sodium chloride, acetaminophen **OR** acetaminophen, albuterol, morphine injection, ondansetron **OR** ondansetron (ZOFRAN) IV, oxyCODONE, sodium chloride   Data Review:   Micro Results Recent Results (from the past 240 hour(s))  Surgical pcr screen     Status: None   Collection Time: 10/27/15  2:50 PM  Result Value Ref Range Status   MRSA, PCR NEGATIVE NEGATIVE Final   Staphylococcus aureus NEGATIVE NEGATIVE Final    Comment:        The Xpert SA Assay (FDA approved for NASAL specimens in patients over 95 years of age), is one component of a comprehensive surveillance program.  Test performance has been validated by [facility] for patients greater than or equal to 31 year old. It is not intended to diagnose infection nor to guide or monitor treatment.     Radiology Reports Dg Chest Port 1 View  10/28/2015  CLINICAL DATA:  Postop hypoxia EXAM: PORTABLE CHEST 1 VIEW COMPARISON:  09/26/2015 FINDINGS: There is massive cardiomegaly. Bilateral interstitial thickening. No pleural effusion or pneumothorax. Dual lumen right-sided central venous catheter with the tip projecting over the SVC. No acute osseous abnormality. IMPRESSION: 1. Cardiomegaly with mild pulmonary vascular congestion. Electronically Signed   By: Kathreen Devoid   On: 10/28/2015 17:29   Dg Finger Ring Left  10/10/2015  CLINICAL DATA:  Ganglion cyst on the left ring finger for 3 years with pain and swelling beginning this morning. Subsequent encounter. EXAM: LEFT RING FINGER 2+V COMPARISON:  Plain films of the left ring finger is  05/23/2015. FINDINGS: Soft tissue swelling of the left ring finger extending from the  distal diaphysis of the proximal phalanx at the DIP joint does not appear markedly changed. Swelling is eccentric dorsally. No underlying acute bony or joint abnormality is seen. No bony destructive change or periostitis is identified. Mild degenerative change at the DIP joint is noted. 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 IMPRESSION: Soft tissue swelling about the dorsal and mid aspect of the left ring finger does not appear markedly changed compared to the prior study. No acute abnormality is identified. Electronically Signed   By: Inge Rise M.D.   On: 10/10/2015 11:20     CBC  Recent Labs Lab 10/27/15 1500 10/28/15 0940 10/29/15 1144  WBC 7.7  --  9.9  HGB 10.3* 11.6* 10.0*  HCT 31.5* 34.0* 31.5*  PLT 288  --  248  MCV 98.4  --  100.9*  MCH 32.2  --  32.0  MCHC 32.8  --  31.7*  RDW 14.7*  --  14.7*  LYMPHSABS 1.2  --   --   MONOABS 0.8  --   --   EOSABS 0.2  --   --   BASOSABS 0.1  --   --     Chemistries   Recent Labs Lab 10/27/15 1500 10/28/15 0940 10/29/15 1144  NA 140 139 140  K 5.0 4.4 5.4*  CL 102  --  101  CO2 29  --  27  GLUCOSE 101* 105* 186*  BUN 30*  --  55*  CREATININE 6.69*  --  10.70*  CALCIUM 9.0  --  8.1*   ------------------------------------------------------------------------------------------------------------------ estimated creatinine clearance is 5 mL/min (by C-G formula based on Cr of 10.7). ------------------------------------------------------------------------------------------------------------------ No results for input(s): HGBA1C in the last 72 hours. ------------------------------------------------------------------------------------------------------------------ No results for input(s): CHOL, HDL, LDLCALC, TRIG, CHOLHDL, LDLDIRECT in the last 72 hours. ------------------------------------------------------------------------------------------------------------------ No results for  input(s): TSH, T4TOTAL, T3FREE, THYROIDAB in the last 72 hours.  Invalid input(s): FREET3 ------------------------------------------------------------------------------------------------------------------ No results for input(s): VITAMINB12, FOLATE, FERRITIN, TIBC, IRON, RETICCTPCT in the last 72 hours.  Coagulation profile  Recent Labs Lab 10/27/15 1500  INR 1.03    No results for input(s): DDIMER in the last 72 hours.  Cardiac Enzymes  Recent Labs Lab 10/29/15 1144  TROPONINI 0.04*   ------------------------------------------------------------------------------------------------------------------ Invalid input(s): POCBNP    Assessment & Plan   1. Acute respiratory failure with hypoxia, likely due to sedating medications  in the setting of  COPD, as well as fluid overload as a result of end-stage renal disease continue oxygen therapy for now. Initiate duo nebs, Steroids, continue nebulizers nephrology to hemodialyze today to remove fluid 2. End-stage renal disease, get nephrologist hemodialyze 3. Hypertension. Continue norvasc, and losartan 4. Pain in limb, continue patient on oxycodone for moderate pain states that she is having significant amount of pain I'll change her morphine to Dilaudid     Code Status Orders        Start     Ordered   10/28/15 1851  Full code   Continuous     10/28/15 1850    Advance Directive Documentation        Most Recent Value   Type of Advance Directive  Living will   Pre-existing out of facility DNR order (yellow form or pink MOST form)     "MOST" Form in Place?             Consults nephrology  DVT Prophylaxis heparin  Lab Results  Component Value Date   PLT 248 10/29/2015     Time Spent in minutes  35 minutes   Dustin Flock M.D on 10/29/2015 at 1:48 PM  Between 7am to 6pm - Pager - [phone]  After 6pm go to [email or web address] - password [email or web address] [place] [facility]   Office  [phone]

## 2015-10-29 NOTE — Progress Notes (Signed)
[facility]  ROUNDING NOTE   Subjective:  The patient had revision of her left upper extremity AV fistula yesterday. 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 She also had a subcutaneous lesion overlying her left shoulder which was also resected. Postsurgical she had shortness of breath and hypoxia and was admitted. She is due for dialysis treatment today as she missed this yesterday.   Objective:  Vital signs in last 24 hours:  Temp:  [97.2 F (36.2 C)-99.8 F (37.7 C)] 98.9 F (37.2 C) (12/03 1258) Pulse Rate:  [56-76] 71 (12/03 1258) Resp:  [15-22] 17 (12/03 1258) BP: (104-186)/(67-91) 133/67 mmHg (12/03 1258) SpO2:  [86 %-100 %] 88 % (12/03 1258)  Weight change:  Filed Weights   10/27/15 0943  Weight: 66.225 kg (146 lb)    Intake/Output: I/O last 3 completed shifts: In: 840 [P.O.:240; I.V.:600] Out: 250 [Urine:200; Blood:50]   Intake/Output this shift:  Total I/O In: 340 [P.O.:340] Out: 500 [Urine:500]  Physical Exam: General: NAD, sitting up in bed  Head: Normocephalic, atraumatic. Moist oral mucosal membranes  Eyes: Anicteric  Neck: Supple, trachea midline  Lungs:  Clear to auscultation normal effort  Heart: Regular rate and rhythm  Abdomen:  Soft, nontender,   Extremities:  trace peripheral edema.  Neurologic: Nonfocal, moving all four extremities  Skin: No lesions  Access: RIJ dialysis catheter, LUE AVF    Basic Metabolic Panel:  Recent Labs Lab 10/27/15 1500 10/28/15 0940 10/29/15 1144  NA 140 139 140  K 5.0 4.4 5.4*  CL 102  --  101  CO2 29  --  27  GLUCOSE 101* 105* 186*  BUN 30*  --  55*  CREATININE 6.69*  --  10.70*  CALCIUM 9.0  --  8.1*    Liver Function Tests: No results for input(s): AST, ALT, ALKPHOS, BILITOT, PROT, ALBUMIN in the last 168 hours. No results for input(s): LIPASE, AMYLASE in the last 168 hours. No results for input(s): AMMONIA in the last 168 hours.  CBC:  Recent  Labs Lab 10/27/15 1500 10/28/15 0940 10/29/15 1144  WBC 7.7  --  9.9  NEUTROABS 5.4  --   --   HGB 10.3* 11.6* 10.0*  HCT 31.5* 34.0* 31.5*  MCV 98.4  --  100.9*  PLT 288  --  248    Cardiac Enzymes:  Recent Labs Lab 10/29/15 1144  TROPONINI 0.04*    BNP: Invalid input(s): POCBNP  CBG:  Recent Labs Lab 10/28/15 1404 10/28/15 2148 10/29/15 0740 10/29/15 1151  GLUCAP 121* 147* 118* 136*    Microbiology: Results for orders placed or performed during the hospital encounter of 10/27/15  Surgical pcr screen     Status: None   Collection Time: 10/27/15  2:50 PM  Result Value Ref Range Status   MRSA, PCR NEGATIVE NEGATIVE Final   Staphylococcus aureus NEGATIVE NEGATIVE Final    Comment:        The Xpert SA Assay (FDA approved for NASAL specimens in patients over 73 years of age), is one component of a comprehensive surveillance program.  Test performance has been validated by [facility] for patients greater than or equal to 4 year old. It is not intended to diagnose infection nor to guide or monitor treatment.     Coagulation Studies:  Recent Labs  10/27/15 1500  LABPROT 13.7  INR 1.03    Urinalysis: No results for input(s): COLORURINE, LABSPEC, PHURINE, GLUCOSEU, HGBUR, BILIRUBINUR, KETONESUR, PROTEINUR, UROBILINOGEN, NITRITE,  LEUKOCYTESUR in the last 72 hours.  Invalid input(s): APPERANCEUR    Imaging: Dg Chest Port 1 View  10/28/2015  CLINICAL DATA:  Postop hypoxia EXAM: PORTABLE CHEST 1 VIEW COMPARISON:  09/26/2015 FINDINGS: There is massive cardiomegaly. Bilateral interstitial thickening. No pleural effusion or pneumothorax. Dual lumen right-sided central venous catheter with the tip projecting over the SVC. No acute osseous abnormality. IMPRESSION: 1. Cardiomegaly with mild pulmonary vascular congestion. Electronically Signed   By: Kathreen Devoid   On: 10/28/2015 17:29     Medications:     . ALPRAZolam  0.5 mg Oral BID  . amLODipine  10  mg Oral Daily  . calcium carbonate  1 tablet Oral BID WC  . carvedilol  12.5 mg Oral BH-q7a  . cinacalcet  30 mg Oral Daily  . ezetimibe  10 mg Oral BH-q7a  . fluticasone  2 spray Each Nare Daily  . furosemide  40 mg Intravenous Q12H  . guaiFENesin  600 mg Oral BID  . heparin  5,000 Units Subcutaneous 3 times per day  . insulin aspart  0-9 Units Subcutaneous TID WC  . insulin aspart  3 Units Subcutaneous TID WC  . ipratropium-albuterol  3 mL Nebulization Q4H  . losartan  100 mg Oral BH-q7a  . mometasone-formoterol  2 puff Inhalation BID  . multivitamin  1 tablet Oral Daily  . pantoprazole  40 mg Oral Daily  . predniSONE  50 mg Oral Q breakfast  . sertraline  50 mg Oral BH-q7a  . sodium chloride  3 mL Intravenous Q12H  . sodium chloride  3 mL Intravenous Q12H   sodium chloride, acetaminophen **OR** acetaminophen, albuterol, morphine injection, ondansetron **OR** ondansetron (ZOFRAN) IV, oxyCODONE, sodium chloride  Assessment/ Plan:  59 y.o. female with pmhx of HTN, hyperlipidemia, diastolic heart failure, hypertension, COPD, tobacco abuse, AOCD, SHPTH, LUE AVF, ESRD 02/2013  1.  ESRD on HD MWF:  Patient missed dialysis today. Therefore we will prepare dialysis orders for today. Patient just had revision of her left upper extremity AV fistula. Aneurysmal portion was resected.  Her fistula will need to be rested for 3 weeks.  2. Anemia of chronic kidney disease. Hemoglobin currently 10.0. We will resume Epogen as an outpatient.  3. Secondary hyperparathyroidism. Check intact PTH and phosphorus today. Continue Sensipar as well as calcium carbonate.  4. Hypertension. Blood pressure found to be 133/67. Patient will continue losartan amlodipine and Coreg.    LOS:  Travoris Bushey 12/3/20161:02 PM

## 2015-10-29 NOTE — Progress Notes (Signed)
Initial Nutrition Assessment  DOCUMENTATION CODES:      INTERVENTION:   Meals and Snacks: Cater to patient preferences Medical Food Supplement Therapy: will recommend on follow if intake poor   NUTRITION DIAGNOSIS:   No nutrition diagnosis at this time  GOAL:   Patient will meet greater than or equal to 90% of their needs  MONITOR:    (Energy Intake, Electrolyte and renal Profile, Anthropometrics, Digestive System)  REASON FOR ASSESSMENT:   Diagnosis    ASSESSMENT:   Pt s/p revision of left arm dialysis fistula yesterday. Pt sleeping soundly on visit today.  Past Medical History  Diagnosis Date  . Chronic kidney disease   . COPD (chronic obstructive pulmonary disease) ([facility])   . Hypertension   . Hyperlipidemia   . Depression   . Dialysis patient ([facility]) 2014  . Obesity   . Bronchitis   . Headache   . GERD (gastroesophageal reflux disease)   . Arthritis   . Anemia   . Renal dialysis device, implant, or graft complication     RIGHT CHEST CATH  . Apnea, sleep     for sleep study 10/17/15-no cpap yet  . Diabetes mellitus without complication ([place])     no meds     Diet Order:  Diet renal/carb modified with fluid restriction Diet-HS Snack?: Nothing; Room service appropriate?: Yes; Fluid consistency:: Thin    Current Nutrition: Pt ate 100% of lunch and breakfast today.   Food/Nutrition-Related History: Per MST no decrease in appetite PTA.   Scheduled Medications:  . ALPRAZolam  0.5 mg Oral BID  . amLODipine  10 mg Oral Daily  . calcium carbonate  1 tablet Oral BID WC  . carvedilol  12.5 mg Oral BH-q7a  . cinacalcet  30 mg Oral Daily  . ezetimibe  10 mg Oral BH-q7a  . fluticasone  2 spray Each Nare Daily  . furosemide  40 mg Intravenous Q12H  . guaiFENesin  600 mg Oral BID  . heparin  5,000 Units Subcutaneous 3 times per day  . insulin aspart  0-9 Units Subcutaneous TID WC  . insulin aspart  3 Units Subcutaneous TID WC  . ipratropium-albuterol  3 mL  Nebulization Q4H  . losartan  100 mg Oral BH-q7a  . mometasone-formoterol  2 puff Inhalation BID  . multivitamin  1 tablet Oral Daily  . pantoprazole  40 mg Oral Daily  . predniSONE  50 mg Oral Q breakfast  . sertraline  50 mg Oral BH-q7a  . sodium chloride  3 mL Intravenous Q12H  . sodium chloride  3 mL Intravenous Q12H      Electrolyte/Renal Profile and Glucose Profile:   Recent Labs Lab 10/27/15 1500 10/28/15 0940 10/29/15 1144  NA 140 139 140  K 5.0 4.4 5.4*  CL 102  --  101  CO2 29  --  27  BUN 30*  --  55*  CREATININE 6.69*  --  10.70*  CALCIUM 9.0  --  8.1*  GLUCOSE 101* 105* 186*   Protein Profile: No results for input(s): ALBUMIN in the last 168 hours.  Gastrointestinal Profile: Last BM:  10/28/2015   Weight Change: Per [facility] stable weight   Height:   Ht Readings from Last 1 Encounters:  10/28/15 5\' 5"  (1.651 m)    Weight:   Wt Readings from Last 1 Encounters:  10/27/15 146 lb (66.225 kg)   Wt Readings from Last 10 Encounters:  10/27/15 146 lb (66.225 kg)  10/10/15 146 lb (66.225 kg)  09/20/15 145 lb (65.772 kg)  08/27/15 147 lb (66.679 kg)  08/25/15 147 lb (66.679 kg)  08/24/15 147 lb (66.679 kg)  08/23/15 147 lb (66.679 kg)  08/05/15 147 lb (66.679 kg)  07/27/15 145 lb (65.772 kg)  05/24/15 147 lb (66.679 kg)     BMI:  Body mass index is 24.3 kg/(m^2).   LOW Care Level  Dwyane Luo, [place], [place] Pager [phone]

## 2015-10-29 NOTE — Progress Notes (Addendum)
Dr. Posey Pronto notified of troponin level of 0.04 .  Also notify him  that lasix IV  was held until patient comes back from dialysis.No new order received.

## 2015-10-30 LAB — GLUCOSE, CAPILLARY
Glucose-Capillary: 84 mg/dL (ref 65–99)
Glucose-Capillary: 207 mg/dL — ABNORMAL HIGH (ref 65–99)

## 2015-10-30 LAB — TROPONIN I: TROPONIN I: 0.04 ng/mL — AB (ref <0.031)

## 2015-10-30 LAB — PARATHYROID HORMONE, INTACT (NO CA): PTH: 198 pg/mL — AB (ref 15–65)

## 2015-10-30 MED ORDER — HYDROCODONE-ACETAMINOPHEN 5-325 MG PO TABS: 2.0000 | ORAL_TABLET | ORAL | Status: DC | PRN

## 2015-10-30 MED ORDER — DIPHENHYDRAMINE HCL 25 MG PO CAPS
25.0000 mg | ORAL_CAPSULE | Freq: Every evening | ORAL | Status: DC | PRN
  Administered 2015-10-30: 25 mg via ORAL
  Filled 2015-10-30: qty 1

## 2015-10-30 MED ORDER — DOCUSATE SODIUM 100 MG PO CAPS: 100.0000 mg | ORAL_CAPSULE | Freq: Two times a day (BID) | ORAL | Status: DC

## 2015-10-30 NOTE — Care Management Note (Signed)
Case Management Note  Patient Details  Name: Michele Nash MRN: [id number] Date of Birth: [birth year]  Subjective/Objective:   A request for new oxygen was faxed and called to [facility] earlier today. This Probation officer called to check on time of delivery of a portable oxygen tank to [name] in [facility] room [address] and was told by [facility] that it would be delivered today before 5pm.                  Action/Plan:   Expected Discharge Date:  10/28/15               Expected Discharge Plan:     In-House Referral:     Discharge planning Services     Post Acute Care Choice:    Choice offered to:     DME Arranged:    DME Agency:     HH Arranged:    [place] Agency:     Status of Service:     Medicare Important Message Given:    Date Medicare IM Given:    Medicare IM give by:    Date Additional Medicare IM Given:    Additional Medicare Important Message give by:     If discussed at [place] of Stay Meetings, dates discussed:    Additional Comments:  [name] A, RN 10/30/2015, 2:43 PM

## 2015-10-30 NOTE — Progress Notes (Signed)
[facility]  ROUNDING NOTE   Subjective:  Patient had hemodialysis yesterday. In good spirits at present. Has some mild pain in the left upper extremity.  Objective:  Vital signs in last 24 hours:  Temp:  [98 F (36.7 C)-100 F (37.8 C)] 98.6 F (37 C) (12/04 1333) Pulse Rate:  [69-87] 81 (12/04 1333) Resp:  [13-22] 18 (12/04 0526) BP: (122-177)/(62-87) 169/85 mmHg (12/04 1333) SpO2:  [86 %-98 %] 96 % (12/04 1333) Weight:  [66.225 kg (146 lb)] 66.225 kg (146 lb) (12/04 0500)  Weight change:  Filed Weights   10/27/15 0943 10/30/15 0500  Weight: 66.225 kg (146 lb) 66.225 kg (146 lb)    Intake/Output: I/O last 3 completed shifts: In: 1020 [P.O.:1020] Out: 1550 [Urine:1550]   Intake/Output this shift:  Total I/O In: 240 [P.O.:240] Out: -   Physical Exam: General: NAD, sitting up in bed  Head: Normocephalic, atraumatic. Moist oral mucosal membranes  Eyes: Anicteric  Neck: Supple, trachea midline  Lungs:  Clear to auscultation normal effort  Heart: Regular rate and rhythm  Abdomen:  Soft, nontender, bowel sounds present  Extremities: no peripheral edema.  Neurologic: Nonfocal, moving all four extremities  Skin: No lesions  Access: RIJ permcath, LUE AVF    Basic Metabolic Panel:  Recent Labs Lab 10/27/15 1500 10/28/15 0940 10/29/15 1144 10/29/15 1716  NA 140 139 140  --   K 5.0 4.4 5.4*  --   CL 102  --  101  --   CO2 29  --  27  --   GLUCOSE 101* 105* 186*  --   BUN 30*  --  55*  --   CREATININE 6.69*  --  10.70*  --   CALCIUM 9.0  --  8.1*  --   PHOS  --   --   --  4.7*    Liver Function Tests: No results for input(s): AST, ALT, ALKPHOS, BILITOT, PROT, ALBUMIN in the last 168 hours. No results for input(s): LIPASE, AMYLASE in the last 168 hours. No results for input(s): AMMONIA in the last 168 hours.  CBC:  Recent Labs Lab 10/27/15 1500 10/28/15 0940 10/29/15 1144  WBC 7.7  --  9.9  NEUTROABS 5.4  --   --   HGB 10.3* 11.6*  10.0*  HCT 31.5* 34.0* 31.5*  MCV 98.4  --  100.9*  PLT 288  --  248    Cardiac Enzymes:  Recent Labs Lab 10/29/15 1144  TROPONINI 0.04*    BNP: Invalid input(s): POCBNP  CBG:  Recent Labs Lab 10/29/15 1151 10/29/15 1632 10/29/15 2044 10/30/15 0726 10/30/15 1235  GLUCAP 136* 195* 88 45 207*    Microbiology: Results for orders placed or performed during the hospital encounter of 10/27/15  Surgical pcr screen     Status: None   Collection Time: 10/27/15  2:50 PM  Result Value Ref Range Status   MRSA, PCR NEGATIVE NEGATIVE Final   Staphylococcus aureus NEGATIVE NEGATIVE Final    Comment:        The Xpert SA Assay (FDA approved for NASAL specimens in patients over 63 years of age), is one component of a comprehensive surveillance program.  Test performance has been validated by [facility] for patients greater than or equal to 47 year old. It is not intended to diagnose infection nor to guide or monitor treatment.     Coagulation Studies:  Recent Labs  10/27/15 1500  LABPROT 13.7  INR 1.03    Urinalysis: No  results for input(s): COLORURINE, LABSPEC, [place], GLUCOSEU, HGBUR, BILIRUBINUR, KETONESUR, PROTEINUR, UROBILINOGEN, NITRITE, LEUKOCYTESUR in the last 72 hours.  Invalid input(s): APPERANCEUR    Imaging: Dg Chest Port 1 View  10/28/2015  CLINICAL DATA:  Postop hypoxia EXAM: PORTABLE CHEST 1 VIEW COMPARISON:  09/26/2015 FINDINGS: There is massive cardiomegaly. Bilateral interstitial thickening. No pleural effusion or pneumothorax. Dual lumen right-sided central venous catheter with the tip projecting over the SVC. No acute osseous abnormality. IMPRESSION: 1. Cardiomegaly with mild pulmonary vascular congestion. Electronically Signed   By: Kathreen Devoid   On: 10/28/2015 17:29     Medications:     . ALPRAZolam  0.5 mg Oral BID  . amLODipine  10 mg Oral Daily  . calcium carbonate  1 tablet Oral BID WC  . carvedilol  12.5 mg Oral BH-q7a  .  cinacalcet  30 mg Oral Daily  . ezetimibe  10 mg Oral BH-q7a  . fluticasone  2 spray Each Nare Daily  . furosemide  40 mg Intravenous Q12H  . guaiFENesin  600 mg Oral BID  . heparin  5,000 Units Subcutaneous 3 times per day  . insulin aspart  0-9 Units Subcutaneous TID WC  . insulin aspart  3 Units Subcutaneous TID WC  . ipratropium-albuterol  3 mL Nebulization Q4H  . losartan  100 mg Oral BH-q7a  . mometasone-formoterol  2 puff Inhalation BID  . multivitamin  1 tablet Oral Daily  . pantoprazole  40 mg Oral Daily  . predniSONE  50 mg Oral Q breakfast  . sertraline  50 mg Oral BH-q7a  . sodium chloride  3 mL Intravenous Q12H  . sodium chloride  3 mL Intravenous Q12H   sodium chloride, sodium chloride, sodium chloride, acetaminophen **OR** acetaminophen, albuterol, alteplase, diphenhydrAMINE, heparin, lidocaine (PF), lidocaine-prilocaine, morphine injection, ondansetron **OR** ondansetron (ZOFRAN) IV, oxyCODONE, pentafluoroprop-tetrafluoroeth, sodium chloride  Assessment/ Plan:  59 y.o. female with pmhx of HTN, hyperlipidemia, diastolic heart failure, hypertension, COPD, tobacco abuse, AOCD, SHPTH, LUE AVF, ESRD 02/2013, revision of aneurysmal LUE AVF 12/16  1.  ESRD on HD MWF:  Patient had dialysis yesterday.  No acute indication for dialysis today.  Patient will resume her normal outpatient dialysis schedule tomorrow.  Patient has had succesful revision of her LUE AVF.  This will need to rest for 3 weeks before it can be used.  Use permcath for now for HD.  2. Anemia of chronic kidney disease. Continue Epogen as an outpatient.  3. Secondary hyperparathyroidism. Phosphorus 4.7 and acceptable. Continue Sensipar as well as calcium carbonate.  4. Hypertension. Blood pressure 169/85 but has been labile. Patient will continue losartan, amlodipine, and Coreg.    LOS: 1 Heike Pounds 12/4/20162:40 PM

## 2015-10-30 NOTE — Discharge Instructions (Signed)
°  DIET:  °Renal diet ° °DISCHARGE CONDITION:  °Stable ° °ACTIVITY:  °Activity as tolerated ° °OXYGEN:  °Home Oxygen: No. °  °Oxygen Delivery: room air ° °DISCHARGE LOCATION:  °home  ° ° °ADDITIONAL DISCHARGE INSTRUCTION: ° ° °If you experience worsening of your admission symptoms, develop shortness of breath, life threatening emergency, suicidal or homicidal thoughts you must seek medical attention immediately by calling 911 or calling your MD immediately  if symptoms less severe. ° °You Must read complete instructions/literature along with all the possible adverse reactions/side effects for all the Medicines you take and that have been prescribed to you. Take any new Medicines after you have completely understood and accpet all the possible adverse reactions/side effects.  ° °Please note ° °You were cared for by a hospitalist during your hospital stay. If you have any questions about your discharge medications or the care you received while you were in the hospital after you are discharged, you can call the unit and asked to speak with the hospitalist on call if the hospitalist that took care of you is not available. Once you are discharged, your primary care physician will handle any further medical issues. Please note that NO REFILLS for any discharge medications will be authorized once you are discharged, as it is imperative that you return to your primary care physician (or establish a relationship with a primary care physician if you do not have one) for your aftercare needs so that they can reassess your need for medications and monitor your lab values. ° ° °

## 2015-10-30 NOTE — Discharge Summary (Signed)
Michele Nash, 59 y.o., DOB 04/24/1956, MRN [id number]. Admission date: 10/28/2015 Discharge Date 10/30/2015 Primary MD [name], MD Admitting Physician Theodoro Grist, MD  Admission Diagnosis  ESRD  Discharge Diagnosis   Active Problems:   Acute respiratory failure with hypoxia (HCC)   Pain in limb   End stage renal disease ([place])   Essential hypertension   Hypoxemia  Acute on chronic COPD exasperation Hypertension Hyperlipidemia Depression Obesity GERD Arthritis Anemia Sleep apnea Diabetes       Hospital Course   Michele Nash is a 59 y.o. female with a known history of end-stage renal disease, pain in limb abnormal heart rhythm. Tobacco dependence, obesity, emphysema, hypertension who presents to the hospital for hemodialysis access revision. Patient underwent revision of left arm brachiocephalic fistula with resection of an aneurysmal segment. Postprocedure, she was complaining of significant left arm pain and was given 50 g of fentanyl with hypoxic episodes. She was initiated on oxygen therapy and had difficulty to be weaned off, she was given a few doses of DuoNeb nebs with no significant improvement. Chest x-ray showed fluid overload. She was thought to have acute COPD exacerbation was treated with nebulizers with improvement in her breathing. Patient is still requiring oxygen but likely can be weaned off in a week or so. She was also dialyzed.          Consults  nephrology  Significant Tests:  See full reports for all details    Dg Chest Port 1 View  10/28/2015  CLINICAL DATA:  Postop hypoxia EXAM: PORTABLE CHEST 1 VIEW COMPARISON:  09/26/2015 FINDINGS: There is massive cardiomegaly. Bilateral interstitial thickening. No pleural effusion or pneumothorax. Dual lumen right-sided central venous catheter with the tip projecting over the SVC. No acute osseous abnormality. IMPRESSION: 1. Cardiomegaly with mild pulmonary vascular congestion. Electronically Signed   By:  Kathreen Devoid   On: 10/28/2015 17:29   Dg Finger Ring Left  10/10/2015  CLINICAL DATA:  Ganglion cyst on the left ring finger for 3 years with pain and swelling beginning this morning. Subsequent encounter. EXAM: LEFT RING FINGER 2+V COMPARISON:  Plain films of the left ring finger is 05/23/2015. FINDINGS: Soft tissue swelling of the left ring finger extending from the distal diaphysis of the proximal phalanx at the DIP joint does not appear markedly changed. Swelling is eccentric dorsally. No underlying acute bony or joint abnormality is seen. No bony destructive change or periostitis is identified. Mild degenerative change at the DIP joint is noted. 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 IMPRESSION: Soft tissue swelling about the dorsal and mid aspect of the left ring finger does not appear markedly changed compared to the prior study. No acute abnormality is identified. Electronically Signed   By: Inge Rise M.D.   On: 10/10/2015 11:20       Today   Subjective:   Michele Nash  is feeling better better today still has some pain in the arm  Objective:   Blood pressure 169/85, pulse 81, temperature 98.6 F (37 C), temperature source Oral, resp. rate 18, height 5\' 5"  (1.651 m), weight 66.225 kg (146 lb), SpO2 96 %.  .  Intake/Output Summary (Last 24 hours) at 10/30/15 1347 Last data filed at 10/30/15 0900  Gross per 24 hour  Intake    340 ml  Output    250 ml  Net     90 ml    Exam VITAL SIGNS: Blood pressure 169/85,  pulse 81, temperature 98.6 F (37 C), temperature source Oral, resp. rate 18, height 5\' 5"  (1.651 m), weight 66.225 kg (146 lb), SpO2 96 %.  GENERAL:  59 y.o.-year-old patient lying in the bed with no acute distress.  EYES: Pupils equal, round, reactive to light and accommodation. No scleral icterus. Extraocular muscles intact.  HEENT: Head atraumatic, normocephalic. Oropharynx and nasopharynx clear.  NECK:  Supple, no  jugular venous distention. No thyroid enlargement, no tenderness.  LUNGS: Normal breath sounds bilaterally, no wheezing, rales,rhonchi or crepitation. No use of accessory muscles of respiration.  CARDIOVASCULAR: S1, S2 normal. No murmurs, rubs, or gallops.  ABDOMEN: Soft, nontender, nondistended. Bowel sounds present. No organomegaly or mass.  EXTREMITIES: No pedal edema, cyanosis, or clubbing.  NEUROLOGIC: Cranial nerves II through XII are intact. Muscle strength 5/5 in all extremities. Sensation intact. Gait not checked.  PSYCHIATRIC: The patient is alert and oriented x 3.  SKIN:Left fistula site is little swollen   Data Review     CBC w Diff: Lab Results  Component Value Date   WBC 9.9 10/29/2015   WBC 8.3 10/10/2014   HGB 10.0* 10/29/2015   HGB 11.4* 10/10/2014   HCT 31.5* 10/29/2015   HCT 33.8* 10/10/2014   PLT 248 10/29/2015   PLT 304 10/10/2014   LYMPHOPCT 16 10/27/2015   LYMPHOPCT 19.9 06/15/2014   MONOPCT 11 10/27/2015   MONOPCT 10.2 06/15/2014   EOSPCT 2 10/27/2015   EOSPCT 4.4 06/15/2014   BASOPCT 1 10/27/2015   BASOPCT 1.0 06/15/2014   CMP: Lab Results  Component Value Date   NA 140 10/29/2015   NA 141 10/10/2014   K 5.4* 10/29/2015   K 3.7 10/10/2014   CL 101 10/29/2015   CL 96* 10/10/2014   CO2 27 10/29/2015   CO2 37* 10/10/2014   BUN 55* 10/29/2015   BUN 40* 10/10/2014   CREATININE 10.70* 10/29/2015   CREATININE 7.68* 10/10/2014   PROT 7.4 05/01/2015   PROT 7.0 10/10/2014   ALBUMIN 4.0 05/01/2015   ALBUMIN 3.3* 10/10/2014   BILITOT 0.3 05/01/2015   BILITOT 0.3 10/10/2014   ALKPHOS 108 05/01/2015   ALKPHOS 84 10/10/2014   AST 19 05/01/2015   AST 16 10/10/2014   ALT 15 05/01/2015   ALT 20 10/10/2014  .  Micro Results Recent Results (from the past 240 hour(s))  Surgical pcr screen     Status: None   Collection Time: 10/27/15  2:50 PM  Result Value Ref Range Status   MRSA, PCR NEGATIVE NEGATIVE Final   Staphylococcus aureus NEGATIVE  NEGATIVE Final    Comment:        The Xpert SA Assay (FDA approved for NASAL specimens in patients over 67 years of age), is one component of a comprehensive surveillance program.  Test performance has been validated by [facility] for patients greater than or equal to 70 year old. It is not intended to diagnose infection nor to guide or monitor treatment.         Code Status Orders        Start     Ordered   10/28/15 1851  Full code   Continuous     10/28/15 1850    Advance Directive Documentation        Most Recent Value   Type of Advance Directive  Living will   Pre-existing out of facility DNR order (yellow form or pink MOST form)     "MOST" Form in Place?  Follow-up Information    Follow up with Schnier, Dolores Lory, MD.   Specialties:  Vascular Surgery, Cardiology, Radiology, Vascular Surgery   Contact information:   [place] [address] [phone]       Follow up with [name], MD In 7 days.   Specialty:  Internal Medicine   Contact information:   [address] [place] [address] [phone]       Discharge Medications     Medication List    TAKE these medications        albuterol 108 (90 BASE) MCG/ACT inhaler  Commonly known as:  PROVENTIL HFA;VENTOLIN HFA  Inhale 2 puffs into the lungs every 6 (six) hours as needed for wheezing or shortness of breath.     ALPRAZolam 0.5 MG tablet  Commonly known as:  XANAX  Take 0.5 mg by mouth 2 (two) times daily.     amLODipine 10 MG tablet  Commonly known as:  NORVASC  Take 1 tablet (10 mg total) by mouth daily.     calcium carbonate 600 MG Tabs tablet  Commonly known as:  OS-CAL  Take 600 mg by mouth 2 (two) times daily with a meal.     carvedilol 12.5 MG tablet  Commonly known as:  COREG  Take 12.5 mg by mouth every morning.     cephALEXin 500 MG capsule  Commonly known as:  KEFLEX  Take 1 capsule (500 mg total) by mouth 4 (four) times daily.      cinacalcet 30 MG tablet  Commonly known as:  SENSIPAR  Take 30 mg by mouth daily.     docusate sodium 100 MG capsule  Commonly known as:  COLACE  Take 1 capsule (100 mg total) by mouth 2 (two) times daily.     ezetimibe 10 MG tablet  Commonly known as:  ZETIA  Take 10 mg by mouth every morning.     fluticasone 27.5 MCG/SPRAY nasal spray  Commonly known as:  VERAMYST  Place 2 sprays into the nose daily.     Fluticasone-Salmeterol 100-50 MCG/DOSE Aepb  Commonly known as:  ADVAIR  Inhale 1 puff into the lungs 2 (two) times daily.     furosemide 40 MG tablet  Commonly known as:  LASIX  Take 40 mg by mouth daily.     HYDROcodone-acetaminophen 5-325 MG tablet  Commonly known as:  NORCO/VICODIN  Take 2 tablets by mouth every 4 (four) hours as needed for moderate pain.     losartan 100 MG tablet  Commonly known as:  COZAAR  Take 100 mg by mouth every morning. On non-dialysis days     multivitamin Tabs tablet  Take 1 tablet by mouth daily.     omeprazole 20 MG capsule  Commonly known as:  PRILOSEC  Take 20 mg by mouth every morning.     oxyCODONE-acetaminophen 5-325 MG tablet  Commonly known as:  ROXICET  Take 1-2 tablets by mouth every 6 (six) hours as needed for moderate pain or severe pain.     sertraline 50 MG tablet  Commonly known as:  ZOLOFT  Take 50 mg by mouth every morning.           Total Time in preparing paper work, data evaluation and todays exam - 35 minutes  Dustin Flock M.D on 10/30/2015 at 1:47 PM  [facility]   Office  [phone]

## 2015-10-30 NOTE — Progress Notes (Signed)
Alert and oriented. VS's No signs of acute distress. Discharge instructions given . Patient verbalized understanding. O2 thank delivered. No other issues observed.

## 2015-10-30 NOTE — Progress Notes (Signed)
10:39  O2 saturation was 90 % At Rest Room air 10:56  O2 saturation was 86%  Exertion/ambulation Room air 11:06   O2 saturation  Was 93% exertion /ambulation at 1L O2  nasal cannula.

## 2015-10-31 ENCOUNTER — Encounter: Payer: Self-pay | Admitting: Vascular Surgery

## 2015-10-31 LAB — SURGICAL PATHOLOGY

## 2016-01-02 ENCOUNTER — Other Ambulatory Visit: Payer: Self-pay | Admitting: Vascular Surgery

## 2016-01-03 ENCOUNTER — Encounter
Admission: RE | Disposition: A | Discharge status: Home / Self Care | Payer: Self-pay | Source: Ambulatory Visit | Attending: Vascular Surgery

## 2016-01-03 ENCOUNTER — Ambulatory Visit
Admission: RE | Admit: 2016-01-03 | Discharge: 2016-01-03 | Disposition: A | Discharge status: Home / Self Care | Payer: Medicare Other | Source: Ambulatory Visit | Attending: Vascular Surgery | Admitting: Vascular Surgery

## 2016-01-03 DIAGNOSIS — M1990 Unspecified osteoarthritis, unspecified site: Secondary | ICD-10-CM | POA: Diagnosis not present

## 2016-01-03 DIAGNOSIS — Z87891 Personal history of nicotine dependence: Secondary | ICD-10-CM | POA: Insufficient documentation

## 2016-01-03 DIAGNOSIS — J449 Chronic obstructive pulmonary disease, unspecified: Secondary | ICD-10-CM | POA: Diagnosis not present

## 2016-01-03 DIAGNOSIS — E1122 Type 2 diabetes mellitus with diabetic chronic kidney disease: Secondary | ICD-10-CM | POA: Insufficient documentation

## 2016-01-03 DIAGNOSIS — G4730 Sleep apnea, unspecified: Secondary | ICD-10-CM | POA: Insufficient documentation

## 2016-01-03 DIAGNOSIS — I120 Hypertensive chronic kidney disease with stage 5 chronic kidney disease or end stage renal disease: Secondary | ICD-10-CM | POA: Diagnosis not present

## 2016-01-03 DIAGNOSIS — K219 Gastro-esophageal reflux disease without esophagitis: Secondary | ICD-10-CM | POA: Diagnosis not present

## 2016-01-03 DIAGNOSIS — Z4901 Encounter for fitting and adjustment of extracorporeal dialysis catheter: Secondary | ICD-10-CM | POA: Insufficient documentation

## 2016-01-03 DIAGNOSIS — F329 Major depressive disorder, single episode, unspecified: Secondary | ICD-10-CM | POA: Insufficient documentation

## 2016-01-03 DIAGNOSIS — E785 Hyperlipidemia, unspecified: Secondary | ICD-10-CM | POA: Insufficient documentation

## 2016-01-03 DIAGNOSIS — Z992 Dependence on renal dialysis: Secondary | ICD-10-CM | POA: Insufficient documentation

## 2016-01-03 DIAGNOSIS — N186 End stage renal disease: Secondary | ICD-10-CM | POA: Diagnosis not present

## 2016-01-03 DIAGNOSIS — Z882 Allergy status to sulfonamides status: Secondary | ICD-10-CM | POA: Diagnosis not present

## 2016-01-03 HISTORY — PX: PERIPHERAL VASCULAR CATHETERIZATION: SHX172C

## 2016-01-03 SURGERY — DIALYSIS/PERMA CATHETER REMOVAL
Anesthesia: Moderate Sedation

## 2016-01-03 MED ORDER — LIDOCAINE-EPINEPHRINE (PF) 1 %-1:200000 IJ SOLN
INTRAMUSCULAR | Status: DC | PRN
Start: 2016-01-03 — End: 2016-01-03
  Administered 2016-01-03: 20 mL via INTRADERMAL

## 2016-01-03 SURGICAL SUPPLY — 4 items
FCP FG STRG 5.5XNS LF DISP (INSTRUMENTS) ×1
FORCEPS FG STRG 5.5XNS LF DISP (INSTRUMENTS) ×1 IMPLANT
FORCEPS KELLY 5.5 STR (INSTRUMENTS) ×2
TRAY LACERAT/PLASTIC (MISCELLANEOUS) ×3 IMPLANT

## 2016-01-03 NOTE — H&P (Signed)
[facility] Admission History & Physical  MRN : [id number]  Michele Nash is a 60 y.o. (1956/11/13) female who presents with chief complaint of catheter not needed.  History of Present Illness: Patient is sent from dialysis she has working access and no longer needs her tunnel catheter. She denies fever chills. She denies tenderness. There is no history of drainage at the site.  No current facility-administered medications for this encounter.    Past Medical History  Diagnosis Date  . Chronic kidney disease   . COPD (chronic obstructive pulmonary disease) ([place])   . Hypertension   . Hyperlipidemia   . Depression   . Dialysis patient ([place]) 2014  . Obesity   . Bronchitis   . Headache   . GERD (gastroesophageal reflux disease)   . Arthritis   . Anemia   . Renal dialysis device, implant, or graft complication     RIGHT CHEST CATH  . Apnea, sleep     for sleep study 10/17/15-no cpap yet  . Diabetes mellitus without complication ([place])     no meds    Past Surgical History  Procedure Laterality Date  . Abdominal hysterectomy    . Dialysis fistula creation    . Colonoscopy  2011  . Peripheral vascular catheterization N/A 05/10/2015    Procedure: A/V Shuntogram/Fistulagram;  Surgeon: Katha Cabal, MD;  Location: [facility];  Service: Cardiovascular;  Laterality: N/A;  . Peripheral vascular catheterization Left 05/10/2015    Procedure: A/V Shunt Intervention;  Surgeon: Katha Cabal, MD;  Location: [facility];  Service: Cardiovascular;  Laterality: Left;  . Peripheral vascular catheterization Left 08/23/2015    Procedure: A/V Shuntogram/Fistulagram;  Surgeon: Katha Cabal, MD;  Location: [facility];  Service: Cardiovascular;  Laterality: Left;  . Peripheral vascular catheterization N/A 08/23/2015    Procedure: A/V Shunt Intervention;  Surgeon: Katha Cabal, MD;  Location: [facility];  Service:  Cardiovascular;  Laterality: N/A;  . Peripheral vascular catheterization  08/23/2015    Procedure: Dialysis/Perma Catheter Insertion;  Surgeon: Katha Cabal, MD;  Location: [facility];  Service: Cardiovascular;;  . Colonoscopy with propofol N/A 09/20/2015    Procedure: COLONOSCOPY WITH PROPOFOL;  Surgeon: Lucilla Lame, MD;  Location: [facility];  Service: Endoscopy;  Laterality: N/A;  . [place]ison of arteriovenous fistula Left 10/28/2015    Procedure: REVISON OF ARTERIOVENOUS FISTULA WITH ARTEGRAFT;  Surgeon: Katha Cabal, MD;  Location: [facility];  Service: Vascular;  Laterality: Left;  . Wound debridement Left 10/28/2015    Procedure: Resection of shoulder cyst ( left );  Surgeon: Katha Cabal, MD;  Location: [facility];  Service: Vascular;  Laterality: Left;    Social History Social History  Substance Use Topics  . Smoking status: Former Smoker -- 0.50 packs/day for 40 years    Types: Cigarettes    Quit date: 11/23/2014  . Smokeless tobacco: Never Used  . Alcohol Use: No    Family History Family History  Problem Relation Age of Onset  . Heart disease Mother   . Cancer Father   . Cancer Sister    no family history of bleeding or clotting disorders, porphyria or autoimmune disease  Allergies  Allergen Reactions  . Sulfa Antibiotics Swelling     REVIEW OF SYSTEMS (Negative unless checked)  Constitutional: [] Weight loss  [] Fever  [] Chills Cardiac: [] Chest pain   [] Chest pressure   [] Palpitations   [] Shortness of breath  when laying flat   [] Shortness of breath at rest   [] Shortness of breath with exertion. Vascular:  [] Pain in legs with walking   [] Pain in legs at rest   [] Pain in legs when laying flat   [] Claudication   [] Pain in feet when walking  [] Pain in feet at rest  [] Pain in feet when laying flat   [] History of DVT   [] Phlebitis   [] Swelling in legs   [] Varicose veins   [] Non-healing ulcers Pulmonary:   [] Uses home oxygen   [] Productive cough    [] Hemoptysis   [] Wheeze  [] COPD   [] Asthma Neurologic:  [] Dizziness  [] Blackouts   [] Seizures   [] History of stroke   [] History of TIA  [] Aphasia   [] Temporary blindness   [] Dysphagia   [] Weakness or numbness in arms   [] Weakness or numbness in legs Musculoskeletal:  [] Arthritis   [] Joint swelling   [] Joint pain   [] Low back pain Hematologic:  [] Easy bruising  [] Easy bleeding   [] Hypercoagulable state   [] Anemic  [] Hepatitis Gastrointestinal:  [] Blood in stool   [] Vomiting blood  [] Gastroesophageal reflux/heartburn   [] Difficulty swallowing. Genitourinary:  [] Chronic kidney disease   [] Difficult urination  [] Frequent urination  [] Burning with urination   [] Blood in urine Skin:  [] Rashes   [] Ulcers   [] Wounds Psychological:  [] History of anxiety   []  History of major depression.  Physical Examination  Filed Vitals:   01/03/16 0908  BP: 160/94  Pulse: 71  Height: 5\' 5"  (1.651 m)  Weight: 65.772 kg (145 lb)  SpO2: 99%   Body mass index is 24.13 kg/(m^2). Gen: WD/WN, NAD Head: [place]/AT, No temporalis wasting. Prominent temp pulse not noted. Ear/Nose/Throat: Hearing grossly intact, nares w/o erythema or drainage, oropharynx w/o Erythema/Exudate,  Eyes: PERRLA, EOMI.  Neck: Supple, no nuchal rigidity.  No bruit or JVD.  Pulmonary:  Good air movement, clear to auscultation bilaterally, no use of accessory muscles.  Cardiac: RRR, normal S1, S2, no Murmurs, rubs or gallops. Vascular: Tunneled catheter no evidence of infection Gastrointestinal: soft, non-tender/non-distended. No guarding/reflex.  Musculoskeletal: M/S 5/5 throughout.  Extremities without ischemic changes.  No deformity or atrophy.  Neurologic: CN 2-12 intact. Pain and light touch intact in extremities.  Symmetrical.  Speech is fluent. Motor exam as listed above. Psychiatric: Judgment intact, Mood & affect appropriate for pt's clinical situation. Dermatologic: No rashes or ulcers noted.  No cellulitis or open wounds. Lymph : No  Cervical, Axillary, or Inguinal lymphadenopathy.    CBC Lab Results  Component Value Date   WBC 9.9 10/29/2015   HGB 10.0* 10/29/2015   HCT 31.5* 10/29/2015   MCV 100.9* 10/29/2015   PLT 248 10/29/2015    BMET    Component Value Date/Time   NA 140 10/29/2015 1144   NA 141 10/10/2014 1122   K 5.4* 10/29/2015 1144   K 3.7 10/10/2014 1122   CL 101 10/29/2015 1144   CL 96* 10/10/2014 1122   CO2 27 10/29/2015 1144   CO2 37* 10/10/2014 1122   GLUCOSE 186* 10/29/2015 1144   GLUCOSE 125* 10/10/2014 1122   BUN 55* 10/29/2015 1144   BUN 40* 10/10/2014 1122   CREATININE 10.70* 10/29/2015 1144   CREATININE 7.68* 10/10/2014 1122   CALCIUM 8.1* 10/29/2015 1144   CALCIUM 9.0 10/10/2014 1122   GFRNONAA 3* 10/29/2015 1144   GFRNONAA 6* 10/10/2014 1122   GFRNONAA 6* 06/15/2014 0443   GFRAA 4* 10/29/2015 1144   GFRAA 7* 10/10/2014 1122   GFRAA 7* 06/15/2014 0443  CrCl cannot be calculated (Patient has no serum creatinine result on file.).  COAG Lab Results  Component Value Date   INR 1.03 10/27/2015   INR 0.96 09/26/2015   INR 1.1 03/03/2013    Radiology No results found.   Assessment/Plan 1.  End-stage renal disease on hemodialysis: Patient no longer needs her tunnel catheter she has adequate extremity access and therefore her catheter will be removed.   Reka Wist, Dolores Lory, MD  01/03/2016 9:17 AM

## 2016-01-04 ENCOUNTER — Encounter: Payer: Self-pay | Admitting: Vascular Surgery

## 2016-01-06 ENCOUNTER — Encounter: Payer: Self-pay | Admitting: Emergency Medicine

## 2016-01-06 ENCOUNTER — Emergency Department
Admission: EM | Admit: 2016-01-06 | Discharge: 2016-01-06 | Disposition: A | Discharge status: Home / Self Care | Payer: Medicare Other | Attending: Emergency Medicine | Admitting: Emergency Medicine

## 2016-01-06 DIAGNOSIS — R51 Headache: Secondary | ICD-10-CM | POA: Diagnosis not present

## 2016-01-06 DIAGNOSIS — E119 Type 2 diabetes mellitus without complications: Secondary | ICD-10-CM | POA: Diagnosis not present

## 2016-01-06 DIAGNOSIS — N186 End stage renal disease: Secondary | ICD-10-CM | POA: Diagnosis not present

## 2016-01-06 DIAGNOSIS — I120 Hypertensive chronic kidney disease with stage 5 chronic kidney disease or end stage renal disease: Secondary | ICD-10-CM | POA: Diagnosis not present

## 2016-01-06 NOTE — ED Notes (Signed)
Pt states she has been having headaches for 3 months and this headache started around 11 Am this morning. Pt states that pain is across forehead and on top of her head. Pt denies N/V.

## 2016-04-25 ENCOUNTER — Encounter: Payer: Self-pay | Admitting: Emergency Medicine

## 2016-04-25 ENCOUNTER — Emergency Department: Payer: Medicare Other

## 2016-04-25 ENCOUNTER — Observation Stay
Admit: 2016-04-25 | Discharge: 2016-04-25 | Disposition: A | Discharge status: Home / Self Care | Payer: Medicare Other | Attending: Cardiology | Admitting: Cardiology

## 2016-04-25 ENCOUNTER — Observation Stay
Admission: EM | Admit: 2016-04-25 | Discharge: 2016-04-26 | Disposition: A | Discharge status: Home / Self Care | Payer: Medicare Other | Attending: Internal Medicine | Admitting: Internal Medicine

## 2016-04-25 DIAGNOSIS — E669 Obesity, unspecified: Secondary | ICD-10-CM | POA: Insufficient documentation

## 2016-04-25 DIAGNOSIS — F411 Generalized anxiety disorder: Secondary | ICD-10-CM | POA: Diagnosis not present

## 2016-04-25 DIAGNOSIS — R51 Headache: Secondary | ICD-10-CM | POA: Insufficient documentation

## 2016-04-25 DIAGNOSIS — D631 Anemia in chronic kidney disease: Secondary | ICD-10-CM | POA: Insufficient documentation

## 2016-04-25 DIAGNOSIS — Z7951 Long term (current) use of inhaled steroids: Secondary | ICD-10-CM | POA: Diagnosis not present

## 2016-04-25 DIAGNOSIS — E785 Hyperlipidemia, unspecified: Secondary | ICD-10-CM | POA: Insufficient documentation

## 2016-04-25 DIAGNOSIS — Z7982 Long term (current) use of aspirin: Secondary | ICD-10-CM | POA: Insufficient documentation

## 2016-04-25 DIAGNOSIS — R0789 Other chest pain: Principal | ICD-10-CM | POA: Insufficient documentation

## 2016-04-25 DIAGNOSIS — I10 Essential (primary) hypertension: Secondary | ICD-10-CM

## 2016-04-25 DIAGNOSIS — E1151 Type 2 diabetes mellitus with diabetic peripheral angiopathy without gangrene: Secondary | ICD-10-CM

## 2016-04-25 DIAGNOSIS — I081 Rheumatic disorders of both mitral and tricuspid valves: Secondary | ICD-10-CM | POA: Insufficient documentation

## 2016-04-25 DIAGNOSIS — Z6822 Body mass index (BMI) 22.0-22.9, adult: Secondary | ICD-10-CM | POA: Insufficient documentation

## 2016-04-25 DIAGNOSIS — E1122 Type 2 diabetes mellitus with diabetic chronic kidney disease: Secondary | ICD-10-CM | POA: Diagnosis not present

## 2016-04-25 DIAGNOSIS — F17210 Nicotine dependence, cigarettes, uncomplicated: Secondary | ICD-10-CM | POA: Diagnosis not present

## 2016-04-25 DIAGNOSIS — R778 Other specified abnormalities of plasma proteins: Secondary | ICD-10-CM | POA: Insufficient documentation

## 2016-04-25 DIAGNOSIS — N186 End stage renal disease: Secondary | ICD-10-CM | POA: Insufficient documentation

## 2016-04-25 DIAGNOSIS — Z809 Family history of malignant neoplasm, unspecified: Secondary | ICD-10-CM | POA: Diagnosis not present

## 2016-04-25 DIAGNOSIS — R079 Chest pain, unspecified: Secondary | ICD-10-CM | POA: Diagnosis present

## 2016-04-25 DIAGNOSIS — I5032 Chronic diastolic (congestive) heart failure: Secondary | ICD-10-CM | POA: Insufficient documentation

## 2016-04-25 DIAGNOSIS — Z992 Dependence on renal dialysis: Secondary | ICD-10-CM | POA: Diagnosis not present

## 2016-04-25 DIAGNOSIS — Z79899 Other long term (current) drug therapy: Secondary | ICD-10-CM | POA: Insufficient documentation

## 2016-04-25 DIAGNOSIS — K219 Gastro-esophageal reflux disease without esophagitis: Secondary | ICD-10-CM | POA: Insufficient documentation

## 2016-04-25 DIAGNOSIS — Z794 Long term (current) use of insulin: Secondary | ICD-10-CM | POA: Insufficient documentation

## 2016-04-25 DIAGNOSIS — Z8249 Family history of ischemic heart disease and other diseases of the circulatory system: Secondary | ICD-10-CM | POA: Diagnosis not present

## 2016-04-25 DIAGNOSIS — R0989 Other specified symptoms and signs involving the circulatory and respiratory systems: Secondary | ICD-10-CM

## 2016-04-25 DIAGNOSIS — F329 Major depressive disorder, single episode, unspecified: Secondary | ICD-10-CM | POA: Insufficient documentation

## 2016-04-25 DIAGNOSIS — M1990 Unspecified osteoarthritis, unspecified site: Secondary | ICD-10-CM | POA: Insufficient documentation

## 2016-04-25 DIAGNOSIS — Z90710 Acquired absence of both cervix and uterus: Secondary | ICD-10-CM | POA: Diagnosis not present

## 2016-04-25 DIAGNOSIS — I132 Hypertensive heart and chronic kidney disease with heart failure and with stage 5 chronic kidney disease, or end stage renal disease: Secondary | ICD-10-CM | POA: Diagnosis not present

## 2016-04-25 DIAGNOSIS — R42 Dizziness and giddiness: Secondary | ICD-10-CM

## 2016-04-25 DIAGNOSIS — Z882 Allergy status to sulfonamides status: Secondary | ICD-10-CM | POA: Insufficient documentation

## 2016-04-25 DIAGNOSIS — R092 Respiratory arrest: Secondary | ICD-10-CM | POA: Diagnosis not present

## 2016-04-25 DIAGNOSIS — J449 Chronic obstructive pulmonary disease, unspecified: Secondary | ICD-10-CM | POA: Diagnosis not present

## 2016-04-25 LAB — CBC
HCT: 31.5 % — ABNORMAL LOW (ref 35.0–47.0)
Hemoglobin: 10.4 g/dL — ABNORMAL LOW (ref 12.0–16.0)
MCH: 32.7 pg (ref 26.0–34.0)
MCHC: 33 g/dL (ref 32.0–36.0)
MCV: 99.1 fL (ref 80.0–100.0)
PLATELETS: 234 10*3/uL (ref 150–440)
RBC: 3.18 MIL/uL — AB (ref 3.80–5.20)
RDW: 13.9 % (ref 11.5–14.5)
WBC: 7.3 10*3/uL (ref 3.6–11.0)

## 2016-04-25 LAB — BASIC METABOLIC PANEL
Anion gap: 9 (ref 5–15)
BUN: 25 mg/dL — ABNORMAL HIGH (ref 6–20)
CALCIUM: 8.2 mg/dL — AB (ref 8.9–10.3)
CO2: 28 mmol/L (ref 22–32)
CREATININE: 5.75 mg/dL — AB (ref 0.44–1.00)
Chloride: 100 mmol/L — ABNORMAL LOW (ref 101–111)
GFR calc non Af Amer: 7 mL/min — ABNORMAL LOW (ref >60)
GFR, EST AFRICAN AMERICAN: 8 mL/min — AB (ref >60)
GLUCOSE: 106 mg/dL — AB (ref 65–99)
Potassium: 3.6 mmol/L (ref 3.5–5.1)
Sodium: 137 mmol/L (ref 135–145)

## 2016-04-25 LAB — MRSA PCR SCREENING: MRSA by PCR: NEGATIVE

## 2016-04-25 LAB — GLUCOSE, CAPILLARY
GLUCOSE-CAPILLARY: 161 mg/dL — AB (ref 65–99)
GLUCOSE-CAPILLARY: 226 mg/dL — AB (ref 65–99)

## 2016-04-25 LAB — TROPONIN I
TROPONIN I: 0.04 ng/mL — AB (ref <0.031)
Troponin I: 0.07 ng/mL — ABNORMAL HIGH (ref <0.031)

## 2016-04-25 LAB — ECHOCARDIOGRAM COMPLETE
Height: 65 in
Weight: 2152 oz

## 2016-04-25 MED ORDER — ACETAMINOPHEN 325 MG PO TABS: 650.0000 mg | ORAL_TABLET | Freq: Four times a day (QID) | ORAL | Status: DC | PRN

## 2016-04-25 MED ORDER — NITROGLYCERIN 0.4 MG SL SUBL: 0.4000 mg | SUBLINGUAL_TABLET | SUBLINGUAL | Status: DC | PRN

## 2016-04-25 MED ORDER — EZETIMIBE 10 MG PO TABS
10.0000 mg | ORAL_TABLET | Freq: Every day | ORAL | Status: DC
Start: 2016-04-26 — End: 2016-04-26
  Administered 2016-04-26: 10 mg via ORAL
  Filled 2016-04-25: qty 1

## 2016-04-25 MED ORDER — DOCUSATE SODIUM 100 MG PO CAPS
100.0000 mg | ORAL_CAPSULE | Freq: Two times a day (BID) | ORAL | Status: DC
  Administered 2016-04-25 – 2016-04-26 (×2): 100 mg via ORAL
  Filled 2016-04-25 (×2): qty 1

## 2016-04-25 MED ORDER — MOMETASONE FURO-FORMOTEROL FUM 200-5 MCG/ACT IN AERO
2.0000 | INHALATION_SPRAY | Freq: Two times a day (BID) | RESPIRATORY_TRACT | Status: DC
  Administered 2016-04-25 – 2016-04-26 (×2): 2 via RESPIRATORY_TRACT
  Filled 2016-04-25: qty 8.8

## 2016-04-25 MED ORDER — ALPRAZOLAM 0.25 MG PO TABS
0.5000 mg | ORAL_TABLET | Freq: Two times a day (BID) | ORAL | Status: DC
  Administered 2016-04-25 – 2016-04-26 (×2): 0.5 mg via ORAL
  Filled 2016-04-25 (×2): qty 2

## 2016-04-25 MED ORDER — CALCIUM ACETATE 667 MG PO CAPS: 667.0000 mg | ORAL_CAPSULE | ORAL | Status: DC

## 2016-04-25 MED ORDER — SODIUM CHLORIDE 0.9 % IV SOLN: 250.0000 mL | INTRAVENOUS | Status: DC | PRN

## 2016-04-25 MED ORDER — ACETAMINOPHEN 650 MG RE SUPP: 650.0000 mg | Freq: Four times a day (QID) | RECTAL | Status: DC | PRN

## 2016-04-25 MED ORDER — INSULIN ASPART 100 UNIT/ML ~~LOC~~ SOLN: 0.0000 [IU] | Freq: Every day | SUBCUTANEOUS | Status: DC

## 2016-04-25 MED ORDER — ALBUTEROL SULFATE HFA 108 (90 BASE) MCG/ACT IN AERS: 2.0000 | INHALATION_SPRAY | Freq: Four times a day (QID) | RESPIRATORY_TRACT | Status: DC | PRN

## 2016-04-25 MED ORDER — HEPARIN SODIUM (PORCINE) 5000 UNIT/ML IJ SOLN
5000.0000 [IU] | Freq: Two times a day (BID) | INTRAMUSCULAR | Status: DC
  Filled 2016-04-25: qty 1

## 2016-04-25 MED ORDER — FUROSEMIDE 40 MG PO TABS
40.0000 mg | ORAL_TABLET | ORAL | Status: DC
  Administered 2016-04-26: 40 mg via ORAL
  Filled 2016-04-25: qty 1

## 2016-04-25 MED ORDER — CARVEDILOL 12.5 MG PO TABS
12.5000 mg | ORAL_TABLET | Freq: Two times a day (BID) | ORAL | Status: DC
  Administered 2016-04-25 – 2016-04-26 (×2): 12.5 mg via ORAL
  Filled 2016-04-25 (×2): qty 1

## 2016-04-25 MED ORDER — AMLODIPINE BESYLATE 10 MG PO TABS
10.0000 mg | ORAL_TABLET | Freq: Every day | ORAL | Status: DC
  Administered 2016-04-25: 10 mg via ORAL
  Filled 2016-04-25: qty 1

## 2016-04-25 MED ORDER — LIDOCAINE-PRILOCAINE 2.5-2.5 % EX CREA
1.0000 "application " | TOPICAL_CREAM | CUTANEOUS | Status: DC | PRN
  Filled 2016-04-25: qty 5

## 2016-04-25 MED ORDER — CALCIUM ACETATE (PHOS BINDER) 667 MG PO CAPS
1334.0000 mg | ORAL_CAPSULE | Freq: Three times a day (TID) | ORAL | Status: DC
  Administered 2016-04-26: 1334 mg via ORAL
  Filled 2016-04-25: qty 2

## 2016-04-25 MED ORDER — LOSARTAN POTASSIUM 50 MG PO TABS
100.0000 mg | ORAL_TABLET | ORAL | Status: DC
  Administered 2016-04-26: 100 mg via ORAL
  Filled 2016-04-25: qty 2

## 2016-04-25 MED ORDER — RENA-VITE PO TABS
1.0000 | ORAL_TABLET | Freq: Every day | ORAL | Status: DC
  Administered 2016-04-25: 1 via ORAL
  Filled 2016-04-25: qty 1

## 2016-04-25 MED ORDER — CALCIUM ACETATE (PHOS BINDER) 667 MG PO CAPS
667.0000 mg | ORAL_CAPSULE | ORAL | Status: DC
  Administered 2016-04-25: 667 mg via ORAL
  Filled 2016-04-25 (×2): qty 1

## 2016-04-25 MED ORDER — ASPIRIN EC 325 MG PO TBEC
325.0000 mg | DELAYED_RELEASE_TABLET | Freq: Every day | ORAL | Status: DC
  Administered 2016-04-26: 325 mg via ORAL
  Filled 2016-04-25: qty 1

## 2016-04-25 MED ORDER — HYDRALAZINE HCL 20 MG/ML IJ SOLN
10.0000 mg | INTRAMUSCULAR | Status: AC
  Administered 2016-04-25: 10 mg via INTRAVENOUS
  Filled 2016-04-25: qty 1

## 2016-04-25 MED ORDER — PANTOPRAZOLE SODIUM 40 MG PO TBEC
40.0000 mg | DELAYED_RELEASE_TABLET | Freq: Every day | ORAL | Status: DC
  Administered 2016-04-26: 40 mg via ORAL
  Filled 2016-04-25: qty 1

## 2016-04-25 MED ORDER — SODIUM CHLORIDE 0.9% FLUSH
3.0000 mL | Freq: Two times a day (BID) | INTRAVENOUS | Status: DC
  Administered 2016-04-25: 3 mL via INTRAVENOUS

## 2016-04-25 MED ORDER — CINACALCET HCL 30 MG PO TABS
60.0000 mg | ORAL_TABLET | Freq: Every day | ORAL | Status: DC
  Administered 2016-04-26: 60 mg via ORAL
  Filled 2016-04-25: qty 2

## 2016-04-25 MED ORDER — ENOXAPARIN SODIUM 40 MG/0.4ML ~~LOC~~ SOLN: 40.0000 mg | SUBCUTANEOUS | Status: DC

## 2016-04-25 MED ORDER — INSULIN ASPART 100 UNIT/ML ~~LOC~~ SOLN
0.0000 [IU] | Freq: Three times a day (TID) | SUBCUTANEOUS | Status: DC
  Filled 2016-04-25: qty 2

## 2016-04-25 MED ORDER — SODIUM CHLORIDE 0.9% FLUSH: 3.0000 mL | INTRAVENOUS | Status: DC | PRN

## 2016-04-25 MED ORDER — FLUTICASONE PROPIONATE 50 MCG/ACT NA SUSP
2.0000 | Freq: Every day | NASAL | Status: DC | PRN
  Filled 2016-04-25: qty 16

## 2016-04-25 MED ORDER — SERTRALINE HCL 50 MG PO TABS
50.0000 mg | ORAL_TABLET | Freq: Every day | ORAL | Status: DC
  Administered 2016-04-26: 50 mg via ORAL
  Filled 2016-04-25: qty 1

## 2016-04-25 MED ORDER — ONDANSETRON HCL 4 MG PO TABS: 4.0000 mg | ORAL_TABLET | Freq: Four times a day (QID) | ORAL | Status: DC | PRN

## 2016-04-25 MED ORDER — SODIUM CHLORIDE 0.9% FLUSH: 3.0000 mL | Freq: Two times a day (BID) | INTRAVENOUS | Status: DC

## 2016-04-25 MED ORDER — ONDANSETRON HCL 4 MG/2ML IJ SOLN: 4.0000 mg | Freq: Four times a day (QID) | INTRAMUSCULAR | Status: DC | PRN

## 2016-04-25 MED ORDER — ALBUTEROL SULFATE (2.5 MG/3ML) 0.083% IN NEBU: 2.5000 mg | INHALATION_SOLUTION | Freq: Four times a day (QID) | RESPIRATORY_TRACT | Status: DC | PRN

## 2016-04-25 MED ORDER — HYDRALAZINE HCL 20 MG/ML IJ SOLN
10.0000 mg | Freq: Four times a day (QID) | INTRAMUSCULAR | Status: DC | PRN
Start: 2016-04-25 — End: 2016-04-26

## 2016-04-25 NOTE — ED Provider Notes (Signed)
[facility] Emergency Department Provider Note  ____________________________________________  Time seen: Approximately 3:55 PM  I have reviewed the triage vital signs and the nursing notes.   HISTORY  Chief Complaint Chest Pain    HPI Michele Nash is a 60 y.o. female presents for chest pain. Substernal, nonradiating, occurs during dialysis the last 2 days.   Patient reports pain was relieved after aspirin and nitroglycerin. She reports to me that her pain has now gone away. She has had a stress test several years ago but none recently and does not have a cardiologist. She continues to smoke. She has high blood pressure but reports taking her medicine today.  No pain in the abdomen, shortness of breath, trouble breathing, sharp pain or other concerns at this time. She did receive about 45 minutes of dialysis today and did an hour and 45 on Monday but had to stop and both sessions due to chest pain.   Past Medical History  Diagnosis Date  . Chronic kidney disease   . COPD (chronic obstructive pulmonary disease) ([place])   . Hypertension   . Hyperlipidemia   . Depression   . Dialysis patient ([place]) 2014  . Obesity   . Bronchitis   . Headache   . GERD (gastroesophageal reflux disease)   . Arthritis   . Anemia   . Renal dialysis device, implant, or graft complication     RIGHT CHEST CATH  . Apnea, sleep     for sleep study 10/17/15-no cpap yet  . Diabetes mellitus without complication ([place])     no meds    Patient Active Problem List   Diagnosis Date Noted  . Acute respiratory failure with hypoxia ([place]) 10/28/2015  . Pain in limb 10/28/2015  . End stage renal disease ([place]) 10/28/2015  . Essential hypertension 10/28/2015  . Hypoxemia 10/28/2015  . ESRD on hemodialysis ([place]) 03/29/2015  . HTN (hypertension) 03/29/2015  . COPD (chronic obstructive pulmonary disease) ([place]) 03/29/2015  . GAD (generalized anxiety disorder) 03/29/2015    Past  Surgical History  Procedure Laterality Date  . Abdominal hysterectomy    . Dialysis fistula creation    . Colonoscopy  2011  . Peripheral vascular catheterization N/A 05/10/2015    Procedure: A/V Shuntogram/Fistulagram;  Surgeon: Katha Cabal, MD;  Location: [facility];  Service: Cardiovascular;  Laterality: N/A;  . Peripheral vascular catheterization Left 05/10/2015    Procedure: A/V Shunt Intervention;  Surgeon: Katha Cabal, MD;  Location: [facility];  Service: Cardiovascular;  Laterality: Left;  . Peripheral vascular catheterization Left 08/23/2015    Procedure: A/V Shuntogram/Fistulagram;  Surgeon: Katha Cabal, MD;  Location: [facility];  Service: Cardiovascular;  Laterality: Left;  . Peripheral vascular catheterization N/A 08/23/2015    Procedure: A/V Shunt Intervention;  Surgeon: Katha Cabal, MD;  Location: [facility];  Service: Cardiovascular;  Laterality: N/A;  . Peripheral vascular catheterization  08/23/2015    Procedure: Dialysis/Perma Catheter Insertion;  Surgeon: Katha Cabal, MD;  Location: [facility];  Service: Cardiovascular;;  . Colonoscopy with propofol N/A 09/20/2015    Procedure: COLONOSCOPY WITH PROPOFOL;  Surgeon: Lucilla Lame, MD;  Location: [facility];  Service: Endoscopy;  Laterality: N/A;  . Revison of arteriovenous fistula Left 10/28/2015    Procedure: REVISON OF ARTERIOVENOUS FISTULA WITH ARTEGRAFT;  Surgeon: Katha Cabal, MD;  Location: [facility];  Service: Vascular;  Laterality: Left;  . Wound debridement Left 10/28/2015  Procedure: Resection of shoulder cyst ( left );  Surgeon: Katha Cabal, MD;  Location: [facility];  Service: Vascular;  Laterality: Left;  . Peripheral vascular catheterization N/A 01/03/2016    Procedure: Dialysis/Perma Catheter Removal;  Surgeon: Katha Cabal, MD;  Location: [facility];  Service: Cardiovascular;  Laterality: N/A;    Current  Outpatient Rx  Name  Route  Sig  Dispense  Refill  . albuterol (PROVENTIL HFA;VENTOLIN HFA) 108 (90 BASE) MCG/ACT inhaler   Inhalation   Inhale 2 puffs into the lungs every 6 (six) hours as needed for wheezing or shortness of breath.         . ALPRAZolam (XANAX) 0.5 MG tablet   Oral   Take 0.5 mg by mouth 2 (two) times daily.         . calcium carbonate (OS-CAL) 600 MG TABS tablet   Oral   Take 600 mg by mouth 2 (two) times daily with a meal.         . carvedilol (COREG) 12.5 MG tablet   Oral   Take 12.5 mg by mouth every morning.          . cinacalcet (SENSIPAR) 30 MG tablet   Oral   Take 30 mg by mouth daily.         Marland Kitchen ezetimibe (ZETIA) 10 MG tablet   Oral   Take 10 mg by mouth every morning.          . fluticasone (VERAMYST) 27.5 MCG/SPRAY nasal spray   Nasal   Place 2 sprays into the nose daily.         . Fluticasone-Salmeterol (ADVAIR) 100-50 MCG/DOSE AEPB   Inhalation   Inhale 1 puff into the lungs 2 (two) times daily.         . furosemide (LASIX) 40 MG tablet   Oral   Take 40 mg by mouth daily.          Marland Kitchen losartan (COZAAR) 100 MG tablet   Oral   Take 100 mg by mouth every morning. On non-dialysis days         . multivitamin (RENA-VIT) TABS tablet   Oral   Take 1 tablet by mouth daily.         Marland Kitchen omeprazole (PRILOSEC) 20 MG capsule   Oral   Take 20 mg by mouth every morning.          . sertraline (ZOLOFT) 50 MG tablet   Oral   Take 50 mg by mouth every morning.            Allergies Sulfa antibiotics  Family History  Problem Relation Age of Onset  . Heart disease Mother   . Cancer Father   . Cancer Sister     Social History Social History  Substance Use Topics  . Smoking status: Former Smoker -- 0.50 packs/day for 40 years    Types: Cigarettes    Quit date: 11/23/2014  . Smokeless tobacco: Never Used  . Alcohol Use: No    Review of Systems Constitutional: No fever/chills Eyes: No visual changes. ENT: No sore  throat. Cardiovascular: See history of present illness Respiratory: Denies shortness of breath. Gastrointestinal: No abdominal pain.  No nausea, no vomiting.  No diarrhea.  No constipation. Genitourinary: Negative for dysuria. Musculoskeletal: Negative for back pain. Skin: Negative for rash. Neurological: Negative for headaches, focal weakness or numbness.  10-point ROS otherwise negative.  ____________________________________________   PHYSICAL EXAM:  VITAL SIGNS: ED Triage  Vitals  Enc Vitals Group     BP 04/25/16 1414 177/99 mmHg     Pulse --      Resp --      Temp 04/25/16 1414 98 F (36.7 C)     Temp Source 04/25/16 1414 Oral     SpO2 04/25/16 1414 100 %     Weight 04/25/16 1414 134 lb 7.7 oz (61 kg)     Height 04/25/16 1414 5\' 5"  (1.651 m)     Head Cir --      Peak Flow --      Pain Score 04/25/16 1410 2     Pain Loc --      Pain Edu? --      Excl. in [place]? --    Constitutional: Alert and oriented. Well appearing and in no acute distress. Eyes: Conjunctivae are normal. PERRL. EOMI. Head: Atraumatic. Nose: No congestion/rhinnorhea. Mouth/Throat: Mucous membranes are moist.  Oropharynx non-erythematous. Neck: No stridor.   Cardiovascular: Normal rate, regular rhythm. Grossly normal heart sounds.  Good peripheral circulation. Respiratory: Normal respiratory effort.  No retractions. Lungs CTAB. Gastrointestinal: Soft and nontender. No distention.  Musculoskeletal: No lower extremity tenderness nor edema.  No joint effusions. Neurologic:  Normal speech and language. No gross focal neurologic deficits are appreciated. Skin:  Skin is warm, dry and intact. No rash noted. Psychiatric: Mood and affect are normal. Speech and behavior are normal.  ____________________________________________   LABS (all labs ordered are listed, but only abnormal results are displayed)  Labs Reviewed  BASIC METABOLIC PANEL - Abnormal; Notable for the following:    Chloride 100 (*)     Glucose, Bld 106 (*)    BUN 25 (*)    Creatinine, Ser 5.75 (*)    Calcium 8.2 (*)    GFR calc non Af Amer 7 (*)    GFR calc Af Amer 8 (*)    All other components within normal limits  CBC - Abnormal; Notable for the following:    RBC 3.18 (*)    Hemoglobin 10.4 (*)    HCT 31.5 (*)    All other components within normal limits  TROPONIN I - Abnormal; Notable for the following:    Troponin I 0.04 (*)    All other components within normal limits   ____________________________________________  EKG  Reviewed and interpreted by me at 1410 Normal sinus rhythm Heart rate 70 QRS 480 QRS 90 View interpreted as left ventricular hypertrophy, no evidence of acute ischemic abnormality as compared to the patient's previous though inversion is now seen in V2, this is likely positional in nature of morphology of complexes appears same. ____________________________________________  [id number]  DG Chest 2 View (Final result) Result time: 04/25/16 15:16:19   Final result by Rad Results In Interface (04/25/16 15:16:19)   Narrative:   CLINICAL DATA: Chest pain at dialysis. History of COPD.  EXAM: CHEST 2 VIEW  COMPARISON: 10/28/2015  FINDINGS: Persistent enlargement of the cardiac silhouette. The lungs are clear without focal airspace disease or frank pulmonary edema. Trachea is midline. Atherosclerotic calcifications at the aortic arch. No large pleural effusions.  IMPRESSION: Mild cardiomegaly without acute chest findings.   Electronically Signed By: Markus Daft M.D. On: 04/25/2016 15:16    ____________________________________________   PROCEDURES  Procedure(s) performed: None  Critical Care performed: No  ____________________________________________   INITIAL IMPRESSION / ASSESSMENT AND PLAN / ED COURSE  Pertinent labs & imaging results that were available during my care of the patient were reviewed by me  and considered in my medical decision making (see  chart for details).  Patient presents for chest pain, occurs during dialysis runs and substernal in nature. She reports pain was relieved with aspirin and nitroglycerin, and has not had any history of acute cardiac disease that she is aware of. Her EKG shows no acute change, troponin slightly elevated but appears to be chronic likely due to hemodialysis status.  She denies any respiratory symptoms, this hemodynamically stable except for elevated blood pressure. Her blood pressure continued to rise in the ER, I will trial hydralazine for blood pressure control at this time. Discussed with cardiology nurse practitioner for on-call Dr. Chancy Milroy, and the Phylliss Bob, and after discussion of patient presentation risks and no recent cardiac evaluations noted we will admit the patient due to chest pain, patient is moderate risk for coronary disease due to smoking, presentation with relief with aspirin and nitroglycerin and substernal pressure sensation, as well as hypertension, hemodialysis patient.  Patient reevaluated at 3:40 PM, currently asymptomatic. Agreeable with plan for admission. Blood pressure control written for. ____________________________________________   FINAL CLINICAL IMPRESSION(S) / ED DIAGNOSES  Final diagnoses:  Chest pain, moderate coronary artery risk  Essential hypertension      Delman Kitten, MD 04/25/16 [phone]

## 2016-04-25 NOTE — ED Notes (Signed)
Patient at dialysis today and after 1 1/2 hours of treatment patient began to c/o right mid chest pain.. Patient had similar c/o pain on Monday, which resolved.  A totally of 3 NTG spray given PTA as well as 324 mg ASA.  Pain currently 2/10.

## 2016-04-25 NOTE — Consult Note (Signed)
Michele Nash is a 60 y.o. female  [id number]  Primary Cardiologist: Neoma Laming Reason for Consultation: Chest pain  HPI: Michele Nash began having chest pain on Monday on dialysis. This was a pounding type discomfort on the right chest that lasted for a few hours and is not associated with any shortness of breath, nausea, or dizziness. She had a second similar episode with dialysis treatment on Wednesday in one brief episode of chest tightness not related to dialysis yesterday as she was leaning over preparing a meal and this resolved with sitting down after a few minutes. She has no personal cardiac history she has a brother who died of MI in his late 46s or early 47s and it is believed that her mother had heart failure although the patient does not know the details. The patient has been smoking since the age of 16 and is currently working on quitting. She has hypertension, no diabetes, no elevated cholesterol that she is aware of, and she is on hemodialysis for the past 3 years. She does admit that she has had high blood pressure that she says comes down with her dialysis treatments. She has been having frequent headaches. She has COPD and is on continuous oxygen therapy.   Review of Systems: Positive for intermittent chest discomfort. Negative for shortness of breath, headedness or dizziness, near syncope, or peripheral edema   Past Medical History  Diagnosis Date  . Chronic kidney disease   . COPD (chronic obstructive pulmonary disease) ([place])   . Hypertension   . Hyperlipidemia   . Depression   . Dialysis patient ([place]) 2014  . Obesity   . Bronchitis   . Headache   . GERD (gastroesophageal reflux disease)   . Arthritis   . Anemia   . Renal dialysis device, implant, or graft complication     RIGHT CHEST CATH  . Apnea, sleep     for sleep study 10/17/15-no cpap yet  . Diabetes mellitus without complication ([place])     no meds     (Not in a hospital admission)   . insulin  aspart  0-5 Units Subcutaneous QHS  . insulin aspart  0-9 Units Subcutaneous TID WC    Infusions:    Allergies  Allergen Reactions  . Sulfa Antibiotics Swelling, Rash and Other (See Comments)    Reaction:  Facial/body swelling     Social History   Social History  . Marital Status: Married    Spouse Name: N/A  . Number of Children: N/A  . Years of Education: N/A   Occupational History  . Not on file.   Social History Main Topics  . Smoking status: Former Smoker -- 0.50 packs/day for 40 years    Types: Cigarettes    Quit date: 11/23/2014  . Smokeless tobacco: Never Used  . Alcohol Use: No  . Drug Use: No  . Sexual Activity: Not on file   Other Topics Concern  . Not on file   Social History Narrative    Family History  Problem Relation Age of Onset  . Heart disease Mother   . Cancer Father   . Cancer Sister     PHYSICAL EXAM: Filed Vitals:   04/25/16 1550 04/25/16 1616  BP: 200/91 187/86  Pulse:  105  Temp:    Resp:  16    No intake or output data in the 24 hours ending 04/25/16 1703  General:  Well appearing. No respiratory difficulty HEENT: normal Neck: supple. no JVD. Left  carotid bruit nored.  Cor: PMI nondisplaced. Regular rate & rhythm. Murmur 2/6 in mitral area noted. Lungs: clear Abdomen: soft, nontender, nondistended. No hepatosplenomegaly. No bruits or masses. Good bowel sounds. Extremities: no cyanosis, clubbing, rash, edema Neuro: alert & oriented x 3, cranial nerves grossly intact. moves all 4 extremities w/o difficulty. Affect pleasant.  ECG: Normal sinus rhythm with a rate of 67 bpm, left ventricular hypertrophy pattern, and T wave inversions anteriorly medicated and leads V1 through V3.  Results for orders placed or performed during the hospital encounter of 04/25/16 (from the past 24 hour(s))  Basic metabolic panel     Status: Abnormal   Collection Time: 04/25/16  2:18 PM  Result Value Ref Range   Sodium 137 135 - 145 mmol/L    Potassium 3.6 3.5 - 5.1 mmol/L   Chloride 100 (L) 101 - 111 mmol/L   CO2 28 22 - 32 mmol/L   Glucose, Bld 106 (H) 65 - 99 mg/dL   BUN 25 (H) 6 - 20 mg/dL   Creatinine, Ser 5.75 (H) 0.44 - 1.00 mg/dL   Calcium 8.2 (L) 8.9 - 10.3 mg/dL   GFR calc non Af Amer 7 (L) >60 mL/min   GFR calc Af Amer 8 (L) >60 mL/min   Anion gap 9 5 - 15  CBC     Status: Abnormal   Collection Time: 04/25/16  2:18 PM  Result Value Ref Range   WBC 7.3 3.6 - 11.0 K/uL   RBC 3.18 (L) 3.80 - 5.20 MIL/uL   Hemoglobin 10.4 (L) 12.0 - 16.0 g/dL   HCT 31.5 (L) 35.0 - 47.0 %   MCV 99.1 80.0 - 100.0 fL   MCH 32.7 26.0 - 34.0 pg   MCHC 33.0 32.0 - 36.0 g/dL   RDW 13.9 11.5 - 14.5 %   Platelets 234 150 - 440 K/uL  Troponin I     Status: Abnormal   Collection Time: 04/25/16  2:18 PM  Result Value Ref Range   Troponin I 0.04 (H) <0.031 ng/mL   Dg Chest 2 View  04/25/2016  CLINICAL DATA:  Chest pain at dialysis.  History of COPD. EXAM: CHEST  2 VIEW COMPARISON:  10/28/2015 FINDINGS: Persistent enlargement of the cardiac silhouette. The lungs are clear without focal airspace disease or frank pulmonary edema. Trachea is midline. Atherosclerotic calcifications at the aortic arch. No large pleural effusions. IMPRESSION: Mild cardiomegaly without acute chest findings. Electronically Signed   By: Markus Daft M.D.   On: 04/25/2016 15:16     ASSESSMENT AND PLAN:   -Intermittent chest pain in this patient who has uncontrolled hypertension, is on hemodialysis, and has a positive family cardiac history. Troponin is not elevated inappropriately at this time and will cycle troponins through the night. T wave inversions noted anteriorly on EKG. We'll check nuclear stress test and echocardiogram tomorrow. Has had 1 previous echocardiogram on 03/20/2014 which showed normal LV systolic function with an EF 60-65%, mild left ventricular hypertrophy, and mild tricuspid regurgitation.  -Left carotid bruit. The patient does have frequent  headaches, no lightheadedness or near syncope. We'll check bilateral carotid ultrasound with her echocardiogram.  -Uncontrolled hypertension. This may be contributing to her chest pain as well as her headaches. Advise adjusting antihypertensive medications.   Daune Perch, NP 04/25/2016 5:03 PM

## 2016-04-25 NOTE — H&P (Signed)
[facility] [place] NAME: Michele Nash    MR#:  [id number]  DATE OF BIRTH:  05-29-1956  DATE OF ADMISSION:  04/25/2016  PRIMARY CARE PHYSICIAN: [name], MD   REQUESTING/REFERRING PHYSICIAN: Jacqualine Code, MD  CHIEF COMPLAINT:   CHEST PAIN HISTORY OF PRESENT ILLNESS:  Michele Nash  is a 60 y.o. female with a known history of esrd,On hemodialysis on Monday, Wednesday and Friday, diet controlled diabetes mellitus, hypertension, COPD still continues to smoke is presenting to the ED with a chief complaint of chest pain. Patient is reporting that she is experiencing chest pain during her dialysis time. Day before yesterday during dialysis she had the same symptom in her chest pain recur today during dialysis and patient was sent over to the ED. Her chest pain was pressure-like, substernal radiating to the back 10 out of 10 but was completely resolved after giving aspirin and sublingual nitroglycerin. Blood pressure is elevated at 210/90. Initial troponin at 0.04 and EKG with no acute changes. Resting comfortably during my examination. Never had a heart attack in the past. Denies any shortness of breath or diaphoresis  PAST MEDICAL HISTORY:   Past Medical History  Diagnosis Date  . Chronic kidney disease   . COPD (chronic obstructive pulmonary disease) ([place])   . Hypertension   . Hyperlipidemia   . Depression   . Dialysis patient ([place]) 2014  . Obesity   . Bronchitis   . Headache   . GERD (gastroesophageal reflux disease)   . Arthritis   . Anemia   . Renal dialysis device, implant, or graft complication     RIGHT CHEST CATH  . Apnea, sleep     for sleep study 10/17/15-no cpap yet  . Diabetes mellitus without complication ([place])     no meds    PAST SURGICAL HISTOIRY:   Past Surgical History  Procedure Laterality Date  . Abdominal hysterectomy    . Dialysis fistula creation    . Colonoscopy  2011  . Peripheral vascular  catheterization N/A 05/10/2015    Procedure: A/V Shuntogram/Fistulagram;  Surgeon: Katha Cabal, MD;  Location: [facility];  Service: Cardiovascular;  Laterality: N/A;  . Peripheral vascular catheterization Left 05/10/2015    Procedure: A/V Shunt Intervention;  Surgeon: Katha Cabal, MD;  Location: [facility];  Service: Cardiovascular;  Laterality: Left;  . Peripheral vascular catheterization Left 08/23/2015    Procedure: A/V Shuntogram/Fistulagram;  Surgeon: Katha Cabal, MD;  Location: [facility];  Service: Cardiovascular;  Laterality: Left;  . Peripheral vascular catheterization N/A 08/23/2015    Procedure: A/V Shunt Intervention;  Surgeon: Katha Cabal, MD;  Location: [facility];  Service: Cardiovascular;  Laterality: N/A;  . Peripheral vascular catheterization  08/23/2015    Procedure: Dialysis/Perma Catheter Insertion;  Surgeon: Katha Cabal, MD;  Location: [facility];  Service: Cardiovascular;;  . Colonoscopy with propofol N/A 09/20/2015    Procedure: COLONOSCOPY WITH PROPOFOL;  Surgeon: Lucilla Lame, MD;  Location: [facility];  Service: Endoscopy;  Laterality: N/A;  . Revison of arteriovenous fistula Left 10/28/2015    Procedure: REVISON OF ARTERIOVENOUS FISTULA WITH ARTEGRAFT;  Surgeon: Katha Cabal, MD;  Location: [facility];  Service: Vascular;  Laterality: Left;  . Wound debridement Left 10/28/2015    Procedure: Resection of shoulder cyst ( left );  Surgeon: Katha Cabal, MD;  Location: [facility];  Service: Vascular;  Laterality: Left;  .  Peripheral vascular catheterization N/A 01/03/2016    Procedure: Dialysis/Perma Catheter Removal;  Surgeon: Katha Cabal, MD;  Location: [facility];  Service: Cardiovascular;  Laterality: N/A;    SOCIAL HISTORY:   Social History  Substance Use Topics  . Smoking status: Former Smoker -- 0.50 packs/day for 40 years    Types: Cigarettes    Quit date:  11/23/2014  . Smokeless tobacco: Never Used  . Alcohol Use: No    FAMILY HISTORY:   Family History  Problem Relation Age of Onset  . Heart disease Mother   . Cancer Father   . Cancer Sister     DRUG ALLERGIES:   Allergies  Allergen Reactions  . Sulfa Antibiotics Swelling, Rash and Other (See Comments)    Reaction:  Facial/body swelling     REVIEW OF SYSTEMS:  CONSTITUTIONAL: No fever, fatigue or weakness.  EYES: No blurred or double vision.  EARS, NOSE, AND THROAT: No tinnitus or ear pain.  RESPIRATORY: No cough, shortness of breath, wheezing or hemoptysis.  CARDIOVASCULAR: Intermittent episodes of chest pain, denies orthopnea, edema.  GASTROINTESTINAL: No nausea, vomiting, diarrhea or abdominal pain.  GENITOURINARY: No dysuria, hematuria.  ENDOCRINE: No polyuria, nocturia,  HEMATOLOGY: No anemia, easy bruising or bleeding SKIN: No rash or lesion. MUSCULOSKELETAL: No joint pain or arthritis.   NEUROLOGIC: No tingling, numbness, weakness.  PSYCHIATRY: No anxiety or depression.   MEDICATIONS AT HOME:   Prior to Admission medications   Medication Sig Start Date End Date Taking? Authorizing Provider  albuterol (PROVENTIL HFA;VENTOLIN HFA) 108 (90 BASE) MCG/ACT inhaler Inhale 2 puffs into the lungs every 6 (six) hours as needed for wheezing or shortness of breath.   Yes Historical Provider, MD  ALPRAZolam Duanne Moron) 0.5 MG tablet Take 0.5 mg by mouth 2 (two) times daily.   Yes Historical Provider, MD  amLODipine (NORVASC) 10 MG tablet Take 10 mg by mouth at bedtime.   Yes Historical Provider, MD  calcium acetate (PHOSLO) 667 MG capsule Take 667-1,334 mg by mouth See admin instructions. Pt takes two capsules three times daily with meals and one capsule two times daily with snacks.   Yes Historical Provider, MD  calcium carbonate (OS-CAL) 600 MG TABS tablet Take 600 mg by mouth 2 (two) times daily with a meal.   Yes Historical Provider, MD  carvedilol (COREG) 12.5 MG tablet Take  12.5 mg by mouth 2 (two) times daily with a meal.    Yes Historical Provider, MD  cinacalcet (SENSIPAR) 30 MG tablet Take 60 mg by mouth daily with breakfast.    Yes Historical Provider, MD  ezetimibe (ZETIA) 10 MG tablet Take 10 mg by mouth daily.    Yes Historical Provider, MD  fluticasone (FLONASE) 50 MCG/ACT nasal spray Place 2 sprays into both nostrils daily as needed for rhinitis.   Yes Historical Provider, MD  Fluticasone-Salmeterol (ADVAIR) 250-50 MCG/DOSE AEPB Inhale 1 puff into the lungs 2 (two) times daily.   Yes Historical Provider, MD  furosemide (LASIX) 40 MG tablet Take 40 mg by mouth every Tuesday, Thursday, Saturday, and Sunday.    Yes Historical Provider, MD  lidocaine-prilocaine (EMLA) cream Apply 1 application topically as needed (prior to accessing port).   Yes Historical Provider, MD  losartan (COZAAR) 100 MG tablet Take 100 mg by mouth every Tuesday, Thursday, Saturday, and Sunday.    Yes Historical Provider, MD  multivitamin (RENA-VIT) TABS tablet Take 1 tablet by mouth at bedtime.    Yes Historical Provider, MD  omeprazole (  PRILOSEC) 20 MG capsule Take 20 mg by mouth daily.    Yes Historical Provider, MD  sertraline (ZOLOFT) 50 MG tablet Take 50 mg by mouth daily.    Yes Historical Provider, MD      VITAL SIGNS:  Blood pressure 187/86, pulse 105, temperature 98 F (36.7 C), temperature source Oral, resp. rate 16, height 5\' 5"  (1.651 m), weight 61 kg (134 lb 7.7 oz), SpO2 94 %.  PHYSICAL EXAMINATION:  GENERAL:  60 y.o.-year-old patient lying in the bed with no acute distress.  EYES: Pupils equal, round, reactive to light and accommodation. No scleral icterus. Extraocular muscles intact.  HEENT: Head atraumatic, normocephalic. Oropharynx and nasopharynx clear.  NECK:  Supple, no jugular venous distention. No thyroid enlargement, no tenderness.  LUNGS: Normal breath sounds bilaterally, no wheezing, rales,rhonchi or crepitation. No use of accessory muscles of respiration.   CARDIOVASCULAR: S1, S2 normal. No murmurs, rubs, or gallops. No reproducible anterior chest wall tenderness on palpation ABDOMEN: Soft, nontender, nondistended. Bowel sounds present. No organomegaly or mass.  EXTREMITIES: Left upper extremity with AV fistula No pedal edema, cyanosis, or clubbing.  NEUROLOGIC: Cranial nerves II through XII are intact. Muscle strength 5/5 in all extremities. Sensation intact. Gait not checked.  PSYCHIATRIC: The patient is alert and oriented x 3.  SKIN: No obvious rash, lesion, or ulcer.   LABORATORY PANEL:   CBC  Recent Labs Lab 04/25/16 1418  WBC 7.3  HGB 10.4*  HCT 31.5*  PLT 234   ------------------------------------------------------------------------------------------------------------------  Chemistries   Recent Labs Lab 04/25/16 1418  NA 137  K 3.6  CL 100*  CO2 28  GLUCOSE 106*  BUN 25*  CREATININE 5.75*  CALCIUM 8.2*   ------------------------------------------------------------------------------------------------------------------  Cardiac Enzymes  Recent Labs Lab 04/25/16 1418  TROPONINI 0.04*   ------------------------------------------------------------------------------------------------------------------  RADIOLOGY:  Dg Chest 2 View  04/25/2016  CLINICAL DATA:  Chest pain at dialysis.  History of COPD. EXAM: CHEST  2 VIEW COMPARISON:  10/28/2015 FINDINGS: Persistent enlargement of the cardiac silhouette. The lungs are clear without focal airspace disease or frank pulmonary edema. Trachea is midline. Atherosclerotic calcifications at the aortic arch. No large pleural effusions. IMPRESSION: Mild cardiomegaly without acute chest findings. Electronically Signed   By: Markus Daft M.D.   On: 04/25/2016 15:16    EKG:   Orders placed or performed during the hospital encounter of 04/25/16  . EKG 12-Lead  . EKG 12-Lead  . ED EKG within 10 minutes  . ED EKG within 10 minutes    IMPRESSION AND PLAN:  Michele Nash  is a  60 y.o. female with a known history of esrd,On hemodialysis on Monday, Wednesday and Friday, diet controlled diabetes mellitus, hypertension, COPD still continues to smoke is presenting to the ED with a chief complaint of chest pain. Patient is reporting that she is experiencing chest pain during her dialysis time. Day before yesterday during dialysis she had the same symptom in her chest pain recur today during dialysis and patient was sent over to the ED  #Chest pain could be demand ischemia from malignant hypertension Admit to telemetry under observation status Cycle cardiac biomarkers Initial troponin is at 0.04 Continue home medications Coreg, Zetia and Cozaar Aspirin on daily basis Cardiology consult is placed to Dr. Humphrey Rolls. Recommending to rule out acute MI, and if patient's troponin is non-trending they're considering outpatient echocardiogram and stress test after discharge as per medication with cardiology p.m. is [place]  #Diet controlled diabetes mellitus Will provide diabetic diet and sliding  scale insulin Check A1c Consult diabetic coordinator  #Hypertension blood pressure is very elevated Elevated blood pressure is most likely as she has not taken her antihypertensives prior to dialysis Will resume her home medication Cozaar, Coreg and amlodipine and titrate as needed Hydralazine IV will be given when necessary  #Chronic history of COPD and still continues to smoke Currently with no exacerbation. Continue inhalers Counseled patient to quit smoking for 3-5 minutes Patient reports that she uses BiPAP daily at bedtime, will continue the same  #Tobacco abuse disorder Counseled patient to quit smoking for 3-5 minutes. We will provide nicotine patch after ruling out acute MI. Patient is agreeable     All the records are reviewed and case discussed with ED provider. Management plans discussed with the patient, she is  in agreement.  CODE STATUS: FC/HUSBAND HCPOA  TOTAL TIME  TAKING CARE OF THIS PATIENT: 45  minutes.    Nicholes Mango M.D on 04/25/2016 at 4:40 PM  Between 7am to 6pm - Pager - [phone]  After 6pm go to [email or web address] - password [email or web address] [facility]  Office  [phone]  CC: Primary care physician; [name], MD

## 2016-04-25 NOTE — Progress Notes (Signed)
Michele Nash is a 60 y.o. female patient admitted from ED awake, alert - oriented  X 4 - no acute distress noted.  VSS - Blood pressure 181/78, pulse 76, temperature 98.4 F (36.9 C), temperature source Oral, resp. rate 18, height 5\' 5"  (1.651 m), weight 61.009 kg (134 lb 8 oz), SpO2 100 %.    IV in place, occlusive dsg intact without redness.  Orientation to room, and floor completed with information packet given to patient/family.  Patient declined safety video at this time.  Admission INP armband ID verified with patient/family, and in place.   SR up x 2, fall assessment complete, with patient and family able to verbalize understanding of risk associated with falls, and verbalized understanding to call nsg before up out of bed.  Call light within reach, patient able to voice, and demonstrate understanding.  Skin, clean-dry- intact without evidence of bruising, or skin tears.  Skin assessed with Gay Filler, RN tele box verified with Prince Solian, RN and [place], [place].      Will cont to eval and treat per MD orders.  Horton Finer, RN 04/25/2016 6:55 PM

## 2016-04-26 ENCOUNTER — Encounter: Payer: Self-pay | Admitting: Radiology

## 2016-04-26 ENCOUNTER — Observation Stay: Payer: Medicare Other

## 2016-04-26 DIAGNOSIS — R0789 Other chest pain: Secondary | ICD-10-CM | POA: Diagnosis not present

## 2016-04-26 LAB — BASIC METABOLIC PANEL
Anion gap: 8 (ref 5–15)
BUN: 38 mg/dL — ABNORMAL HIGH (ref 6–20)
CALCIUM: 7.7 mg/dL — AB (ref 8.9–10.3)
CO2: 28 mmol/L (ref 22–32)
CREATININE: 7.64 mg/dL — AB (ref 0.44–1.00)
Chloride: 103 mmol/L (ref 101–111)
GFR, EST AFRICAN AMERICAN: 6 mL/min — AB (ref >60)
GFR, EST NON AFRICAN AMERICAN: 5 mL/min — AB (ref >60)
Glucose, Bld: 107 mg/dL — ABNORMAL HIGH (ref 65–99)
Potassium: 4.3 mmol/L (ref 3.5–5.1)
SODIUM: 139 mmol/L (ref 135–145)

## 2016-04-26 LAB — HEMOGLOBIN A1C: HEMOGLOBIN A1C: 6.1 % — AB (ref 4.0–6.0)

## 2016-04-26 LAB — CBC
HCT: 32.9 % — ABNORMAL LOW (ref 35.0–47.0)
Hemoglobin: 10.8 g/dL — ABNORMAL LOW (ref 12.0–16.0)
MCH: 32.9 pg (ref 26.0–34.0)
MCHC: 32.7 g/dL (ref 32.0–36.0)
MCV: 100.7 fL — ABNORMAL HIGH (ref 80.0–100.0)
PLATELETS: 228 10*3/uL (ref 150–440)
RBC: 3.27 MIL/uL — AB (ref 3.80–5.20)
RDW: 14.2 % (ref 11.5–14.5)
WBC: 8.6 10*3/uL (ref 3.6–11.0)

## 2016-04-26 LAB — NM MYOCAR MULTI W/SPECT W/WALL MOTION / EF
CHL CUP MPHR: 159 {beats}/min
CHL CUP NUCLEAR SDS: 1
CHL CUP RESTING HR STRESS: 69 {beats}/min
CSEPEDS: 1 s
CSEPEW: 1 METS
Exercise duration (min): 1 min
LV sys vol: 105 mL
LVDIAVOL: 175 mL (ref 46–106)
NUC STRESS TID: 1.07
Peak HR: 91 {beats}/min
Percent HR: 57 %
SRS: 12
SSS: 11

## 2016-04-26 LAB — GLUCOSE, CAPILLARY
GLUCOSE-CAPILLARY: 112 mg/dL — AB (ref 65–99)
Glucose-Capillary: 172 mg/dL — ABNORMAL HIGH (ref 65–99)
Glucose-Capillary: 134 mg/dL — ABNORMAL HIGH (ref 65–99)

## 2016-04-26 LAB — TSH: TSH: 0.607 u[IU]/mL (ref 0.350–4.500)

## 2016-04-26 LAB — TROPONIN I
TROPONIN I: 0.04 ng/mL — AB (ref <0.031)
TROPONIN I: 0.05 ng/mL — AB (ref <0.031)

## 2016-04-26 MED ORDER — CARVEDILOL 25 MG PO TABS: 25.0000 mg | ORAL_TABLET | Freq: Two times a day (BID) | ORAL | Status: DC

## 2016-04-26 MED ORDER — TECHNETIUM TC 99M TETROFOSMIN IV KIT
28.6100 | PACK | Freq: Once | INTRAVENOUS | Status: AC | PRN
  Administered 2016-04-26: 28.61 via INTRAVENOUS

## 2016-04-26 MED ORDER — REGADENOSON 0.4 MG/5ML IV SOLN
0.4000 mg | Freq: Once | INTRAVENOUS | Status: AC
  Administered 2016-04-26: 0.4 mg via INTRAVENOUS

## 2016-04-26 MED ORDER — TECHNETIUM TC 99M TETROFOSMIN IV KIT
13.0000 | PACK | Freq: Once | INTRAVENOUS | Status: AC | PRN
  Administered 2016-04-26: 13.99 via INTRAVENOUS

## 2016-04-26 NOTE — Progress Notes (Signed)
Per Dr. Benjie Karvonen, patient okay to discharge if stress test negative. Per Dr. Nehemiah Massed, stress test is low risk study. Patient to be discharged shortly. Michele Nash

## 2016-04-26 NOTE — Discharge Summary (Addendum)
[facility] [place] NAME: Michele Nash    MR#:  [id number]  DATE OF BIRTH:  09-20-1956  DATE OF ADMISSION:  04/25/2016 ADMITTING PHYSICIAN: Nicholes Mango, MD  DATE OF DISCHARGE: *04/26/2016  PRIMARY CARE PHYSICIAN: [name], MD    ADMISSION DIAGNOSIS:  Chest pain, moderate coronary artery risk [R07.9] Essential hypertension [I10] Chest pain [R07.9]  DISCHARGE DIAGNOSIS:  Active Problems:   Chest pain   SECONDARY DIAGNOSIS:   Past Medical History  Diagnosis Date  . Chronic kidney disease   . COPD (chronic obstructive pulmonary disease) ([place])   . Hypertension   . Hyperlipidemia   . Depression   . Dialysis patient ([place]) 2014  . Obesity   . Bronchitis   . Headache   . GERD (gastroesophageal reflux disease)   . Arthritis   . Anemia   . Renal dialysis device, implant, or graft complication     RIGHT CHEST CATH  . Apnea, sleep     for sleep study 10/17/15-no cpap yet  . Diabetes mellitus without complication ([place])     no meds    HOSPITAL COURSE:   60 year old female with ESRD on HD with atypical chest pain.  1. Atypical chest pain with minimally elevated troponin: This is not due to ACS.  Patient will follow-up with cardiology as outpatient. Patient was evaluated by cardiology team here who recommended stress test which was read as low risk for ischemia.  2. Elevated troponin: This is due to poor renal clearance and not ACS.  3. ESRD on hemodialysis: Patient may continue with dialysis Monday, Wednesday and Friday. 4. Essential hypertension: Echocardiogram shows diastolic dysfunction likely due to uncontrolled blood pressure. I increase Coreg to 25 mg by mouth twice a day, continue Cozaar and Norvasc. Patient will need close outpatient follow-up with her primary care physician.  5. COPD: Patient was not in exacerbation at this time. Continue inhalers.  6. Chronic diastolic heart failure: Continue Lasix.  DISCHARGE  CONDITIONS AND DIET:   Stable for discharge on heart healthy diet/renal  CONSULTS OBTAINED:  Treatment Team:  [name], MD [name], MD  DRUG ALLERGIES:   Allergies  Allergen Reactions  . Sulfa Antibiotics Swelling, Rash and Other (See Comments)    Reaction:  Facial/body swelling     DISCHARGE MEDICATIONS:   Discharge Medication List as of 04/26/2016  5:03 PM    CONTINUE these medications which have CHANGED   Details  carvedilol (COREG) 25 MG tablet Take 1 tablet (25 mg total) by mouth 2 (two) times daily with a meal., Starting 04/26/2016, Until Discontinued, Normal      CONTINUE these medications which have NOT CHANGED   Details  albuterol (PROVENTIL HFA;VENTOLIN HFA) 108 (90 BASE) MCG/ACT inhaler Inhale 2 puffs into the lungs every 6 (six) hours as needed for wheezing or shortness of breath., Until Discontinued, Historical Med    ALPRAZolam (XANAX) 0.5 MG tablet Take 0.5 mg by mouth 2 (two) times daily., Until Discontinued, Historical Med    amLODipine (NORVASC) 10 MG tablet Take 10 mg by mouth at bedtime., Until Discontinued, Historical Med    calcium acetate (PHOSLO) 667 MG capsule Take 667-1,334 mg by mouth See admin instructions. Pt takes two capsules three times daily with meals and one capsule two times daily with snacks., Until Discontinued, Historical Med    calcium carbonate (OS-CAL) 600 MG TABS tablet Take 600 mg by mouth 2 (two) times daily with a meal., Until Discontinued, Historical  Med    cinacalcet (SENSIPAR) 30 MG tablet Take 60 mg by mouth daily with breakfast. , Until Discontinued, Historical Med    ezetimibe (ZETIA) 10 MG tablet Take 10 mg by mouth daily. , Until Discontinued, Historical Med    fluticasone (FLONASE) 50 MCG/ACT nasal spray Place 2 sprays into both nostrils daily as needed for rhinitis., Until Discontinued, Historical Med    Fluticasone-Salmeterol (ADVAIR) 250-50 MCG/DOSE AEPB Inhale 1 puff into the lungs 2 (two) times daily.,  Until Discontinued, Historical Med    furosemide (LASIX) 40 MG tablet Take 40 mg by mouth every Tuesday, Thursday, Saturday, and Sunday. , Until Discontinued, Historical Med    lidocaine-prilocaine (EMLA) cream Apply 1 application topically as needed (prior to accessing port)., Until Discontinued, Historical Med    losartan (COZAAR) 100 MG tablet Take 100 mg by mouth every Tuesday, Thursday, Saturday, and Sunday. , Until Discontinued, Historical Med    multivitamin (RENA-VIT) TABS tablet Take 1 tablet by mouth at bedtime. , Until Discontinued, Historical Med    omeprazole (PRILOSEC) 20 MG capsule Take 20 mg by mouth daily. , Until Discontinued, Historical Med    sertraline (ZOLOFT) 50 MG tablet Take 50 mg by mouth daily. , Until Discontinued, Historical Med              Today   CHIEF COMPLAINT:  Doing well this morning. No chest pain since admission.   VITAL SIGNS:  Blood pressure 154/85, pulse 79, temperature 98 F (36.7 C), temperature source Oral, resp. rate 24, height 5\' 5"  (1.651 m), weight 61.009 kg (134 lb 8 oz), SpO2 100 %.   REVIEW OF SYSTEMS:  Review of Systems  Constitutional: Negative for fever, chills and malaise/fatigue.  HENT: Negative for ear discharge, ear pain, hearing loss, nosebleeds and sore throat.   Eyes: Negative for blurred vision and pain.  Respiratory: Negative for cough, hemoptysis, shortness of breath and wheezing.   Cardiovascular: Negative for chest pain, palpitations and leg swelling.  Gastrointestinal: Negative for nausea, vomiting, abdominal pain, diarrhea and blood in stool.  Genitourinary: Negative for dysuria.  Musculoskeletal: Negative for back pain.  Neurological: Negative for dizziness, tremors, speech change, focal weakness, seizures and headaches.  Endo/Heme/Allergies: Does not bruise/bleed easily.  Psychiatric/Behavioral: Negative for depression, suicidal ideas and hallucinations.     PHYSICAL EXAMINATION:  GENERAL:  60  y.o.-year-old patient lying in the bed with no acute distress.  NECK:  Supple, no jugular venous distention. No thyroid enlargement, no tenderness.  LUNGS: Normal breath sounds bilaterally, no wheezing, rales,rhonchi  No use of accessory muscles of respiration.  CARDIOVASCULAR: S1, S2 normal. No murmurs, rubs, or gallops.  ABDOMEN: Soft, non-tender, non-distended. Bowel sounds present. No organomegaly or mass.  EXTREMITIES: No pedal edema, cyanosis, or clubbing.  PSYCHIATRIC: The patient is alert and oriented x 3.  SKIN: No obvious rash, lesion, or ulcer.   DATA REVIEW:   CBC  Recent Labs Lab 04/26/16 0542  WBC 8.6  HGB 10.8*  HCT 32.9*  PLT 228    Chemistries   Recent Labs Lab 04/26/16 0542  NA 139  K 4.3  CL 103  CO2 28  GLUCOSE 107*  BUN 38*  CREATININE 7.64*  CALCIUM 7.7*    Cardiac Enzymes  Recent Labs Lab 04/25/16 1820 04/26/16 0011 04/26/16 0542  TROPONINI 0.07* 0.05* 0.04*    Microbiology Results  @[email or web address] @  RADIOLOGY:  Dg Chest 2 View  04/25/2016  CLINICAL DATA:  Chest pain at dialysis.  History of COPD. EXAM: CHEST  2 VIEW COMPARISON:  10/28/2015 FINDINGS: Persistent enlargement of the cardiac silhouette. The lungs are clear without focal airspace disease or frank pulmonary edema. Trachea is midline. Atherosclerotic calcifications at the aortic arch. No large pleural effusions. IMPRESSION: Mild cardiomegaly without acute chest findings. Electronically Signed   By: Markus Daft M.D.   On: 04/25/2016 15:16   US Carotid Bilateral  04/26/2016  CLINICAL DATA:  Left carotid bruit EXAM: BILATERAL CAROTID DUPLEX ULTRASOUND TECHNIQUE: Pearline Cables scale imaging, color Doppler and duplex ultrasound were performed of bilateral carotid and vertebral arteries in the neck. COMPARISON:  None. FINDINGS: Criteria: Quantification of carotid stenosis is based on velocity parameters that correlate the residual internal carotid diameter with NASCET-based stenosis levels, using  the diameter of the distal internal carotid lumen as the denominator for stenosis measurement. The following velocity measurements were obtained: RIGHT ICA:  88/25 cm/sec CCA:  [id number] cm/sec SYSTOLIC ICA/CCA RATIO:  1.4 DIASTOLIC ICA/CCA RATIO:  2.8 ECA:  70 cm/sec LEFT ICA:  88/25 cm/sec CCA:  [id number] cm/sec SYSTOLIC ICA/CCA RATIO:  0.9 DIASTOLIC ICA/CCA RATIO:  1.2 ECA:  140 cm/sec RIGHT CAROTID ARTERY: Grayscale images demonstrate mild atherosclerotic plaque in the region of the carotid bulb and proximal internal carotid artery. The waveforms, velocities and flow velocity ratios show no evidence of focal hemodynamically significant stenosis. RIGHT VERTEBRAL ARTERY:  Antegrade in nature. LEFT CAROTID ARTERY: Grayscale images demonstrate atherosclerotic plaque similar to that seen on the right side. The waveforms, velocities and flow velocity ratios however demonstrate no evidence of focal hemodynamically significant stenosis. LEFT VERTEBRAL ARTERY:  Antegrade in nature. Note is made of a predominately cystic nodule in the right lobe of the thyroid. It measures 2.0 cm in greatest dimension. Dedicated thyroid ultrasound can be performed as clinically necessary. IMPRESSION: Mild atherosclerotic plaque bilaterally without evidence of focal hemodynamically significant stenosis. Electronically Signed   By: Inez Catalina M.D.   On: 04/26/2016 13:45   Nm Myocar Multi W/spect W/wall Motion / Ef  04/26/2016   There was no ST segment deviation noted during stress.  The study is normal.  This is a low risk study.  The left ventricular ejection fraction is moderately decreased (30-44%).       Management plans discussed with the patient and she is in agreement. Stable for discharge home  Patient should follow up with Dr Humphrey Rolls on Tuesday and PCP 1 week  CODE STATUS:     Code Status Orders        Start     Ordered   04/25/16 1805  Full code   Continuous     04/25/16 1804    Code Status History    Date  Active Date Inactive Code Status Order ID Comments User Context   10/28/2015  6:50 PM 10/30/2015  6:09 PM Full Code [id number]  Theodoro Grist, MD Inpatient   03/30/2015 12:51 AM 03/31/2015  4:44 PM Full Code [id number]  Juluis Mire, MD Inpatient    Advance Directive Documentation        Most Recent Value   Type of Advance Directive  Living will   Pre-existing out of facility DNR order (yellow form or pink MOST form)     "MOST" Form in Place?        TOTAL TIME TAKING CARE OF THIS PATIENT: 35 minutes.    Note: This dictation was prepared with Dragon dictation along with smaller phrase technology. Any transcriptional errors that result from this process are unintentional.  Rolan Wrightsman M.D on 04/27/2016  at 7:24 AM  Between 7am to 6pm - Pager - [phone] After 6pm go to [email or web address] - password [email or web address] [facility]  Office  [phone]  CC: Primary care physician; [name], MD

## 2016-04-26 NOTE — Progress Notes (Signed)
[facility]  ROUNDING NOTE   Subjective:   Admitted for chest pain. Completed hemodialysis treatment on Wednesday.   Objective:  Vital signs in last 24 hours:  Temp:  [98 F (36.7 C)-98.4 F (36.9 C)] 98 F (36.7 C) (06/01 0429) Pulse Rate:  [72-105] 79 (06/01 1403) Resp:  [16-24] 24 (06/01 0429) BP: (153-187)/(76-86) 154/85 mmHg (06/01 1403) SpO2:  [94 %-100 %] 100 % (06/01 0429) Weight:  [61.009 kg (134 lb 8 oz)] 61.009 kg (134 lb 8 oz) (05/31 1755)  Weight change:  Filed Weights   04/25/16 1414 04/25/16 1755  Weight: 61 kg (134 lb 7.7 oz) 61.009 kg (134 lb 8 oz)    Intake/Output: I/O last 3 completed shifts: In: 120 [P.O.:120] Out: 250 [Urine:250]   Intake/Output this shift:     Physical Exam: General: NAD  Head: Normocephalic, atraumatic. Moist oral mucosal membranes  Eyes: Anicteric, PERRL  Neck: Supple, trachea midline  Lungs:  Clear to auscultation  Heart: Regular rate and rhythm  Abdomen:  Soft, nontender,   Extremities: No peripheral edema.  Neurologic: Nonfocal, moving all four extremities  Skin: No lesions  Access: Right arm AVF    Basic Metabolic Panel:  Recent Labs Lab 04/25/16 1418 04/26/16 0542  NA 137 139  K 3.6 4.3  CL 100* 103  CO2 28 28  GLUCOSE 106* 107*  BUN 25* 38*  CREATININE 5.75* 7.64*  CALCIUM 8.2* 7.7*    Liver Function Tests: No results for input(s): AST, ALT, ALKPHOS, BILITOT, PROT, ALBUMIN in the last 168 hours. No results for input(s): LIPASE, AMYLASE in the last 168 hours. No results for input(s): AMMONIA in the last 168 hours.  CBC:  Recent Labs Lab 04/25/16 1418 04/26/16 0542  WBC 7.3 8.6  HGB 10.4* 10.8*  HCT 31.5* 32.9*  MCV 99.1 100.7*  PLT 234 228    Cardiac Enzymes:  Recent Labs Lab 04/25/16 1418 04/25/16 1820 04/26/16 0011 04/26/16 0542  TROPONINI 0.04* 0.07* 0.05* 0.04*    BNP: Invalid input(s): POCBNP  CBG:  Recent Labs Lab 04/25/16 1823 04/25/16 2121  04/26/16 0737 04/26/16 1422  GLUCAP 161* 226* 112* 172*    Microbiology: Results for orders placed or performed during the hospital encounter of 04/25/16  MRSA PCR Screening     Status: None   Collection Time: 04/25/16  6:30 PM  Result Value Ref Range Status   MRSA by PCR NEGATIVE NEGATIVE Final    Comment:        The GeneXpert MRSA Assay (FDA approved for NASAL specimens only), is one component of a comprehensive MRSA colonization surveillance program. It is not intended to diagnose MRSA infection nor to guide or monitor treatment for MRSA infections.     Coagulation Studies: No results for input(s): LABPROT, INR in the last 72 hours.  Urinalysis: No results for input(s): COLORURINE, LABSPEC, PHURINE, GLUCOSEU, HGBUR, BILIRUBINUR, KETONESUR, PROTEINUR, UROBILINOGEN, NITRITE, LEUKOCYTESUR in the last 72 hours.  Invalid input(s): APPERANCEUR    Imaging: Dg Chest 2 View  04/25/2016  CLINICAL DATA:  Chest pain at dialysis.  History of COPD. EXAM: CHEST  2 VIEW COMPARISON:  10/28/2015 FINDINGS: Persistent enlargement of the cardiac silhouette. The lungs are clear without focal airspace disease or frank pulmonary edema. Trachea is midline. Atherosclerotic calcifications at the aortic arch. No large pleural effusions. IMPRESSION: Mild cardiomegaly without acute chest findings. Electronically Signed   By: Markus Daft M.D.   On: 04/25/2016 15:16   US Carotid Bilateral  04/26/2016  CLINICAL  DATA:  Left carotid bruit EXAM: BILATERAL CAROTID DUPLEX ULTRASOUND TECHNIQUE: Pearline Cables scale imaging, color Doppler and duplex ultrasound were performed of bilateral carotid and vertebral arteries in the neck. COMPARISON:  None. FINDINGS: Criteria: Quantification of carotid stenosis is based on velocity parameters that correlate the residual internal carotid diameter with NASCET-based stenosis levels, using the diameter of the distal internal carotid lumen as the denominator for stenosis measurement. The  following velocity measurements were obtained: RIGHT ICA:  88/25 cm/sec CCA:  [id number] cm/sec SYSTOLIC ICA/CCA RATIO:  1.4 DIASTOLIC ICA/CCA RATIO:  2.8 ECA:  70 cm/sec LEFT ICA:  88/25 cm/sec CCA:  [id number] cm/sec SYSTOLIC ICA/CCA RATIO:  0.9 DIASTOLIC ICA/CCA RATIO:  1.2 ECA:  140 cm/sec RIGHT CAROTID ARTERY: Grayscale images demonstrate mild atherosclerotic plaque in the region of the carotid bulb and proximal internal carotid artery. The waveforms, velocities and flow velocity ratios show no evidence of focal hemodynamically significant stenosis. RIGHT VERTEBRAL ARTERY:  Antegrade in nature. LEFT CAROTID ARTERY: Grayscale images demonstrate atherosclerotic plaque similar to that seen on the right side. The waveforms, velocities and flow velocity ratios however demonstrate no evidence of focal hemodynamically significant stenosis. LEFT VERTEBRAL ARTERY:  Antegrade in nature. Note is made of a predominately cystic nodule in the right lobe of the thyroid. It measures 2.0 cm in greatest dimension. Dedicated thyroid ultrasound can be performed as clinically necessary. IMPRESSION: Mild atherosclerotic plaque bilaterally without evidence of focal hemodynamically significant stenosis. Electronically Signed   By: Inez Catalina M.D.   On: 04/26/2016 13:45   Nm Myocar Multi W/spect W/wall Motion / Ef  04/26/2016   There was no ST segment deviation noted during stress.  The study is normal.  This is a low risk study.  The left ventricular ejection fraction is moderately decreased (30-44%).      Medications:     . ALPRAZolam  0.5 mg Oral BID  . amLODipine  10 mg Oral QHS  . aspirin EC  325 mg Oral Daily  . calcium acetate  1,334 mg Oral TID WC  . calcium acetate  667 mg Oral With snacks  . carvedilol  12.5 mg Oral BID WC  . cinacalcet  60 mg Oral Q breakfast  . docusate sodium  100 mg Oral BID  . ezetimibe  10 mg Oral Daily  . furosemide  40 mg Oral Q T,Th,S,Su  . heparin subcutaneous  5,000 Units  Subcutaneous Q12H  . insulin aspart  0-5 Units Subcutaneous QHS  . insulin aspart  0-9 Units Subcutaneous TID WC  . losartan  100 mg Oral Q T,Th,S,Su  . mometasone-formoterol  2 puff Inhalation BID  . multivitamin  1 tablet Oral QHS  . pantoprazole  40 mg Oral Daily  . sertraline  50 mg Oral Daily  . sodium chloride flush  3 mL Intravenous Q12H  . sodium chloride flush  3 mL Intravenous Q12H   sodium chloride, acetaminophen **OR** acetaminophen, albuterol, fluticasone, hydrALAZINE, lidocaine-prilocaine, nitroGLYCERIN, ondansetron **OR** ondansetron (ZOFRAN) IV, sodium chloride flush  Assessment/ Plan:  Ms. PAYTN ARMENTA is a 60 y.o. black female with hypertension, hyperlipidemia, diastolic heart failure, hypertension, COPD, tobacco abuse, AOCD, SHPTH,  ESRD since 02/2013.   MWF [place]  1. End Stage Renal Disease: dialysis for tomorrow. AVF  2. Anemia of chronic kidney disease: macrocytic. EPO as outapatient.   3. Secondary Hyparathyroidism: PTH and phos at goal as outpatient.  - cinacalcet and calcium acetate.   4. Hyperension: not well controlled.  - losartan,  amlodipine, carvedilol, furosemide  5. Diabetes Mellitus type II: hemoglobin A1c 6.1% - continue glucose control.     LOS:  Juleen China, Lurena Nida 6/1/20174:12 PM

## 2016-04-26 NOTE — Progress Notes (Signed)
Patient not having chest pains and mildly elevated.May go home with f/u office next week.

## 2016-04-26 NOTE — Progress Notes (Signed)
  SUBJECTIVE: The patient is pleasant and conversive this morning and denies any chest pain or discomfort, shortness of breath, nausea, or lightheadedness through the night.   Filed Vitals:   04/25/16 1616 04/25/16 1704 04/25/16 1755 04/26/16 0429  BP: 187/86 153/80 181/78 174/76  Pulse: 105 76 76 72  Temp:   98.4 F (36.9 C) 98 F (36.7 C)  TempSrc:   Oral   Resp: 16 21 18 24   Height:   5\' 5"  (1.651 m)   Weight:   134 lb 8 oz (61.009 kg)   SpO2: 94% 98% 100% 100%    Intake/Output Summary (Last 24 hours) at 04/26/16 0758 Last data filed at 04/25/16 2025  Gross per 24 hour  Intake    120 ml  Output    250 ml  Net   -130 ml    LABS: Basic Metabolic Panel:  Recent Labs  04/25/16 1418  NA 137  K 3.6  CL 100*  CO2 28  GLUCOSE 106*  BUN 25*  CREATININE 5.75*  CALCIUM 8.2*   Liver Function Tests: No results for input(s): AST, ALT, ALKPHOS, BILITOT, PROT, ALBUMIN in the last 72 hours. No results for input(s): LIPASE, AMYLASE in the last 72 hours. CBC:  Recent Labs  04/25/16 1418 04/26/16 0542  WBC 7.3 8.6  HGB 10.4* 10.8*  HCT 31.5* 32.9*  MCV 99.1 100.7*  PLT 234 228   Cardiac Enzymes:  Recent Labs  04/25/16 1418 04/25/16 1820 04/26/16 0011  TROPONINI 0.04* 0.07* 0.05*   BNP: Invalid input(s): POCBNP D-Dimer: No results for input(s): DDIMER in the last 72 hours. Hemoglobin A1C: No results for input(s): HGBA1C in the last 72 hours. Fasting Lipid Panel: No results for input(s): CHOL, HDL, LDLCALC, TRIG, CHOLHDL, LDLDIRECT in the last 72 hours. Thyroid Function Tests: No results for input(s): TSH, T4TOTAL, T3FREE, THYROIDAB in the last 72 hours.  Invalid input(s): FREET3 Anemia Panel: No results for input(s): VITAMINB12, FOLATE, FERRITIN, TIBC, IRON, RETICCTPCT in the last 72 hours.   PHYSICAL EXAM General: Well developed, well nourished, in no acute distress HEENT:  Normocephalic and atramatic Neck:  Left carotid bruit present Lungs: Clear  bilaterally to auscultation and percussion. Heart: HRRR . 2/6 systolic murmur in mitral region Abdomen: Bowel sounds are positive, abdomen soft. Mild epigastric tenderness. Msk:  Back normal, normal gait. Normal strength and tone for age. Extremities: No clubbing, cyanosis or edema.   Neuro: Alert and oriented X 3. Psych:  Good affect, responds appropriately  TELEMETRY: Normal sinus rhythm at 70 bpm  ASSESSMENT AND PLAN: Atypical chest pain: gastroesophageal reflux versus cardiac ischemia. Troponins are mildly elevated at 0.04, 0.07, 0.05, however, have remained essentially flat. Her echocardiogram showed normal left ventricular systolic function with ejection fraction of 70%, mild left ventricular hypertrophy, grade 1 diastolic dysfunction, and normal wall motion. She has stress test scheduled for today which will hopefully be conducted by Dr. Nehemiah Massed of [facility]. If stress test is negative advise discharge with follow-up in the office on Monday, June 5 at 11:00 AM.  The patient may benefit from increasing pantoprazole to twice a day or few weeks.  Hypertension not ideally controlled. Consider adding oral hydralazine.  The patient has a left carotid bruit and this can be addressed in the office.  Active Problems:   Chest pain    Daune Perch, NP 04/26/2016 7:58 AM

## 2016-04-26 NOTE — Progress Notes (Signed)
Patient d/c'd home. Education provided, no questions at this time. Patient picked up by husband. Telemetry removed. [place] Chui R [place]  

## 2016-05-02 ENCOUNTER — Emergency Department: Payer: Medicare Other

## 2016-05-02 ENCOUNTER — Encounter: Payer: Self-pay | Admitting: Emergency Medicine

## 2016-05-02 ENCOUNTER — Emergency Department
Admission: EM | Admit: 2016-05-02 | Discharge: 2016-05-02 | Disposition: A | Discharge status: Home / Self Care | Payer: Medicare Other | Attending: Emergency Medicine | Admitting: Emergency Medicine

## 2016-05-02 DIAGNOSIS — E785 Hyperlipidemia, unspecified: Secondary | ICD-10-CM | POA: Insufficient documentation

## 2016-05-02 DIAGNOSIS — Z79899 Other long term (current) drug therapy: Secondary | ICD-10-CM | POA: Insufficient documentation

## 2016-05-02 DIAGNOSIS — M1990 Unspecified osteoarthritis, unspecified site: Secondary | ICD-10-CM | POA: Insufficient documentation

## 2016-05-02 DIAGNOSIS — F329 Major depressive disorder, single episode, unspecified: Secondary | ICD-10-CM | POA: Diagnosis not present

## 2016-05-02 DIAGNOSIS — I120 Hypertensive chronic kidney disease with stage 5 chronic kidney disease or end stage renal disease: Secondary | ICD-10-CM | POA: Insufficient documentation

## 2016-05-02 DIAGNOSIS — Z992 Dependence on renal dialysis: Secondary | ICD-10-CM | POA: Diagnosis not present

## 2016-05-02 DIAGNOSIS — J029 Acute pharyngitis, unspecified: Secondary | ICD-10-CM

## 2016-05-02 DIAGNOSIS — N186 End stage renal disease: Secondary | ICD-10-CM | POA: Insufficient documentation

## 2016-05-02 DIAGNOSIS — E1122 Type 2 diabetes mellitus with diabetic chronic kidney disease: Secondary | ICD-10-CM | POA: Diagnosis not present

## 2016-05-02 DIAGNOSIS — G8929 Other chronic pain: Secondary | ICD-10-CM | POA: Insufficient documentation

## 2016-05-02 DIAGNOSIS — Z87891 Personal history of nicotine dependence: Secondary | ICD-10-CM | POA: Insufficient documentation

## 2016-05-02 DIAGNOSIS — J449 Chronic obstructive pulmonary disease, unspecified: Secondary | ICD-10-CM | POA: Insufficient documentation

## 2016-05-02 DIAGNOSIS — R0600 Dyspnea, unspecified: Secondary | ICD-10-CM

## 2016-05-02 DIAGNOSIS — R079 Chest pain, unspecified: Secondary | ICD-10-CM | POA: Diagnosis not present

## 2016-05-02 LAB — CBC
HCT: 32.4 % — ABNORMAL LOW (ref 35.0–47.0)
HEMOGLOBIN: 10.7 g/dL — AB (ref 12.0–16.0)
MCH: 32.4 pg (ref 26.0–34.0)
MCHC: 32.9 g/dL (ref 32.0–36.0)
MCV: 98.7 fL (ref 80.0–100.0)
Platelets: 245 10*3/uL (ref 150–440)
RBC: 3.29 MIL/uL — AB (ref 3.80–5.20)
RDW: 14.4 % (ref 11.5–14.5)
WBC: 9.5 10*3/uL (ref 3.6–11.0)

## 2016-05-02 LAB — BASIC METABOLIC PANEL
ANION GAP: 11 (ref 5–15)
BUN: 56 mg/dL — ABNORMAL HIGH (ref 6–20)
CHLORIDE: 102 mmol/L (ref 101–111)
CO2: 24 mmol/L (ref 22–32)
Calcium: 7.6 mg/dL — ABNORMAL LOW (ref 8.9–10.3)
Creatinine, Ser: 9.33 mg/dL — ABNORMAL HIGH (ref 0.44–1.00)
GFR calc non Af Amer: 4 mL/min — ABNORMAL LOW (ref >60)
GFR, EST AFRICAN AMERICAN: 5 mL/min — AB (ref >60)
GLUCOSE: 105 mg/dL — AB (ref 65–99)
Potassium: 4.8 mmol/L (ref 3.5–5.1)
Sodium: 137 mmol/L (ref 135–145)

## 2016-05-02 LAB — TROPONIN I: Troponin I: 0.14 ng/mL — ABNORMAL HIGH (ref <0.031)

## 2016-05-02 MED ORDER — CLOPIDOGREL BISULFATE 75 MG PO TABS: 75.0000 mg | ORAL_TABLET | Freq: Every day | ORAL | Status: DC

## 2016-05-02 MED ORDER — OXYCODONE-ACETAMINOPHEN 5-325 MG PO TABS
2.0000 | ORAL_TABLET | Freq: Once | ORAL | Status: AC
Start: 2016-05-02 — End: 2016-05-02
  Administered 2016-05-02: 2 via ORAL
  Filled 2016-05-02: qty 2

## 2016-05-02 MED ORDER — GI COCKTAIL ~~LOC~~
30.0000 mL | Freq: Once | ORAL | Status: AC
  Administered 2016-05-02: 30 mL via ORAL
  Filled 2016-05-02: qty 30

## 2016-05-02 NOTE — ED Provider Notes (Addendum)
[facility] Emergency Department Provider Note        Time seen: ----------------------------------------- 11:00 AM on 05/02/2016 -----------------------------------------    I have reviewed the triage vital signs and the nursing notes.   HISTORY  Chief Complaint Chest Pain    HPI Michele Nash is a 60 y.o. female who presents to ER with shortness of breath and chest pain that started this morning after she woke up. Patient reports she was posterior dialysis today but because she was having chest pain is in her to the ER for evaluation. She does report pressure and heaviness in the chest. She is on 3 L of oxygen all the time. Patient was just admitted to the hospital a week ago for chest pain.Patient states she does not want [place] to focus on her chest pain or difficulty breathing. Her main complaint is right ear pain and sore throat with pain with swallowing. Patient had an unremarkable nuclear stress test 6 days ago.   Past Medical History  Diagnosis Date  . Chronic kidney disease   . COPD (chronic obstructive pulmonary disease) ([place])   . Hypertension   . Hyperlipidemia   . Depression   . Dialysis patient ([place]) 2014  . Obesity   . Bronchitis   . Headache   . GERD (gastroesophageal reflux disease)   . Arthritis   . Anemia   . Renal dialysis device, implant, or graft complication     RIGHT CHEST CATH  . Apnea, sleep     for sleep study 10/17/15-no cpap yet  . Diabetes mellitus without complication ([place])     no meds    Patient Active Problem List   Diagnosis Date Noted  . Chest pain 04/25/2016  . Acute respiratory failure with hypoxia ([place]) 10/28/2015  . Pain in limb 10/28/2015  . End stage renal disease ([place]) 10/28/2015  . Essential hypertension 10/28/2015  . Hypoxemia 10/28/2015  . ESRD on hemodialysis ([place]) 03/29/2015  . HTN (hypertension) 03/29/2015  . COPD (chronic obstructive pulmonary disease) ([place]) 03/29/2015  . GAD (generalized  anxiety disorder) 03/29/2015    Past Surgical History  Procedure Laterality Date  . Abdominal hysterectomy    . Dialysis fistula creation    . Colonoscopy  2011  . Peripheral vascular catheterization N/A 05/10/2015    Procedure: A/V Shuntogram/Fistulagram;  Surgeon: Katha Cabal, MD;  Location: [place] [facility];  Service: Cardiovascular;  Laterality: N/A;  . Peripheral vascular catheterization Left 05/10/2015    Procedure: A/V Shunt Intervention;  Surgeon: Katha Cabal, MD;  Location: [place] [facility];  Service: Cardiovascular;  Laterality: Left;  . Peripheral vascular catheterization Left 08/23/2015    Procedure: A/V Shuntogram/Fistulagram;  Surgeon: Katha Cabal, MD;  Location: [place] [facility];  Service: Cardiovascular;  Laterality: Left;  . Peripheral vascular catheterization N/A 08/23/2015    Procedure: A/V Shunt Intervention;  Surgeon: Katha Cabal, MD;  Location: [place] [facility];  Service: Cardiovascular;  Laterality: N/A;  . Peripheral vascular catheterization  08/23/2015    Procedure: Dialysis/Perma Catheter Insertion;  Surgeon: Katha Cabal, MD;  Location: [place] [facility];  Service: Cardiovascular;;  . Colonoscopy with propofol N/A 09/20/2015    Procedure: COLONOSCOPY WITH PROPOFOL;  Surgeon: Lucilla Lame, MD;  Location: [facility];  Service: Endoscopy;  Laterality: N/A;  . Revison of arteriovenous fistula Left 10/28/2015    Procedure: REVISON OF ARTERIOVENOUS FISTULA WITH ARTEGRAFT;  Surgeon: Katha Cabal, MD;  Location: [facility];  Service:  Vascular;  Laterality: Left;  . Wound debridement Left 10/28/2015    Procedure: Resection of shoulder cyst ( left );  Surgeon: Katha Cabal, MD;  Location: [facility];  Service: Vascular;  Laterality: Left;  . Peripheral vascular catheterization N/A 01/03/2016    Procedure: Dialysis/Perma Catheter Removal;  Surgeon: Katha Cabal, MD;  Location: [facility] [facility];  Service: Cardiovascular;   Laterality: N/A;    Allergies Sulfa antibiotics  Social History Social History  Substance Use Topics  . Smoking status: Former Smoker -- 0.50 packs/day for 40 years    Types: Cigarettes    Quit date: 11/23/2014  . Smokeless tobacco: Never Used  . Alcohol Use: No    Review of Systems Constitutional: Negative for fever. Eyes: Negative for visual changes. [id number] for sore throat and right ear pain Cardiovascular: Positive for chest pain Respiratory: Positive shortness of breath Gastrointestinal: Negative for abdominal pain, vomiting and diarrhea. Genitourinary: Negative for dysuria. Musculoskeletal: Negative for back pain. Skin: Negative for rash. Neurological: Negative for headaches, focal weakness or numbness.  10-point ROS otherwise negative.  ____________________________________________   PHYSICAL EXAM:  VITAL SIGNS: ED Triage Vitals  Enc Vitals Group     BP 05/02/16 1046 157/80 mmHg     Pulse Rate 05/02/16 1046 65     Resp 05/02/16 1046 18     Temp 05/02/16 1046 98.3 F (36.8 C)     Temp Source 05/02/16 1046 Oral     SpO2 05/02/16 1046 100 %     Weight 05/02/16 1046 134 lb (60.782 kg)     Height --      Head Cir --      Peak Flow --      Pain Score 05/02/16 1047 7     Pain Loc --      Pain Edu? --      Excl. in [place]? --    Constitutional: Alert and oriented. Not particularly ill, no acute distress Eyes: Conjunctivae are normal. PERRL. Normal extraocular movements. ENT   Head: Normocephalic and atraumatic.   Nose: No congestion/rhinnorhea.      Ears: TMs are clear bilaterally   Mouth/Throat: Mild uvulitis and posterior pharyngeal erythema   Neck: No stridor. Cardiovascular: Normal rate, regular rhythm. No murmurs, rubs, or gallops. Respiratory: Mild tachypnea with mild retractions. Basilar rales Gastrointestinal: Soft and nontender. Normal bowel sounds Musculoskeletal: Nontender with normal range of motion in all extremities. No lower  extremity tenderness nor edema. Neurologic:  Normal speech and language. No gross focal neurologic deficits are appreciated.  Skin:  Skin is warm, dry and intact. No rash noted. Psychiatric: Mood and affect are normal. Speech and behavior are normal.  ____________________________________________  EKG: Interpreted by me.Normal sinus rhythm with a rate of 68 bpm, normal PR interval, normal QRS, normal QT interval. Normal axis. No evidence of acute infarction.  ____________________________________________  ED COURSE:  Pertinent labs & imaging results that were available during my care of the patient were reviewed by me and considered in my medical decision making (see chart for details). Patient presents to the ER with multiple complaints. Her main complaint to me is dysphagia and ear pain. She does have a negative nuclear stress test 6 days ago. Patient states she needs dialysis and we will attempt to arrange to get her there this afternoon. ____________________________________________    LABS (pertinent positives/negatives)  Labs Reviewed  BASIC METABOLIC PANEL - Abnormal; Notable for the following:    Glucose, Bld 105 (*)  BUN 56 (*)    Creatinine, Ser 9.33 (*)    Calcium 7.6 (*)    GFR calc non Af Amer 4 (*)    GFR calc Af Amer 5 (*)    All other components within normal limits  CBC - Abnormal; Notable for the following:    RBC 3.29 (*)    Hemoglobin 10.7 (*)    HCT 32.4 (*)    All other components within normal limits  TROPONIN I - Abnormal; Notable for the following:    Troponin I 0.14 (*)    All other components within normal limits    RADIOLOGY Images were viewed by me  Chest x-ray  IMPRESSION: 1. Mild diffuse peribronchial cuffing may suggest an acute bronchitis. 2. Mild cardiomegaly. 3. Atherosclerosis. ____________________________________________  FINAL ASSESSMENT AND PLAN  Chronic chest pain, chronic shortness of breath, pharyngitis  Plan: Patient  with labs and imaging as dictated above. Patient resents to the ER with chest pain and difficulty breathing as well as pharyngitis and right ear pain. She did receive a GI cocktail and some Percocet. Her troponin is elevated at 0.14. We have discussed the case with her cardiologist who recommends him seeing her in the office tomorrow. He only recommended addition would be the addition of Plavix. She is due for dialysis today.   Earleen Newport, MD   Note: This dictation was prepared with Dragon dictation. Any transcriptional errors that result from this process are unintentional   Earleen Newport, MD 05/02/16 1231  Earleen Newport, MD 05/02/16 1320

## 2016-05-02 NOTE — Consult Note (Signed)
[facility] [place] NAME: Michele Nash    MR#:  [id number]  DATE OF BIRTH:  1956-09-29  DATE OF ADMISSION:  05/02/2016  PRIMARY CARE PHYSICIAN: [name], MD   REQUESTING/REFERRING PHYSICIAN: Dr Roderic Palau  CHIEF COMPLAINT:   Chief Complaint  Patient presents with  . Chest Pain    HISTORY OF PRESENT ILLNESS:  Michele Nash  is a 60 y.o. female with a known history of Incisional disease, hypertension, hyperlipidemia comes to the emergency room before going to dialysis with increasing shortness of breath and chest pain. Patient states her chest pain is resolved. She uses oxygen 24 7. She is hemodynamically stable. Her troponin last week was 0.0 4.05 today's appointment once 4. Patient's EKG shows normal sinus rhythm along with LAD and ordered Q waves in anterior leads. Patient will get her dialysis today after she is released from the emergency room.  Patient was admitted last week she underwent a Myoview stress test was essentially negative. It was low risk for ischemia. Her EF is 70%. She saw Dr. Darrow Bussing yesterday in the office and is scheduled to get CTA of the hard sometime next week. PAST MEDICAL HISTORY:   Past Medical History  Diagnosis Date  . Chronic kidney disease   . COPD (chronic obstructive pulmonary disease) ([place])   . Hypertension   . Hyperlipidemia   . Depression   . Dialysis patient ([place]) 2014  . Obesity   . Bronchitis   . Headache   . GERD (gastroesophageal reflux disease)   . Arthritis   . Anemia   . Renal dialysis device, implant, or graft complication     RIGHT CHEST CATH  . Apnea, sleep     for sleep study 10/17/15-no cpap yet  . Diabetes mellitus without complication ([place])     no meds    PAST SURGICAL HISTOIRY:   Past Surgical History  Procedure Laterality Date  . Abdominal hysterectomy    . Dialysis fistula creation    . Colonoscopy  2011  . Peripheral vascular catheterization N/A 05/10/2015     Procedure: A/V Shuntogram/Fistulagram;  Surgeon: Katha Cabal, MD;  Location: [facility];  Service: Cardiovascular;  Laterality: N/A;  . Peripheral vascular catheterization Left 05/10/2015    Procedure: A/V Shunt Intervention;  Surgeon: Katha Cabal, MD;  Location: [facility];  Service: Cardiovascular;  Laterality: Left;  . Peripheral vascular catheterization Left 08/23/2015    Procedure: A/V Shuntogram/Fistulagram;  Surgeon: Katha Cabal, MD;  Location: [facility];  Service: Cardiovascular;  Laterality: Left;  . Peripheral vascular catheterization N/A 08/23/2015    Procedure: A/V Shunt Intervention;  Surgeon: Katha Cabal, MD;  Location: [facility];  Service: Cardiovascular;  Laterality: N/A;  . Peripheral vascular catheterization  08/23/2015    Procedure: Dialysis/Perma Catheter Insertion;  Surgeon: Katha Cabal, MD;  Location: [facility];  Service: Cardiovascular;;  . Colonoscopy with propofol N/A 09/20/2015    Procedure: COLONOSCOPY WITH PROPOFOL;  Surgeon: Lucilla Lame, MD;  Location: [facility];  Service: Endoscopy;  Laterality: N/A;  . Revison of arteriovenous fistula Left 10/28/2015    Procedure: REVISON OF ARTERIOVENOUS FISTULA WITH ARTEGRAFT;  Surgeon: Katha Cabal, MD;  Location: [facility];  Service: Vascular;  Laterality: Left;  . Wound debridement Left 10/28/2015    Procedure: Resection of shoulder cyst ( left );  Surgeon: Katha Cabal, MD;  Location: [facility];  Service:  Vascular;  Laterality: Left;  . Peripheral vascular catheterization N/A 01/03/2016    Procedure: Dialysis/Perma Catheter Removal;  Surgeon: Katha Cabal, MD;  Location: [facility];  Service: Cardiovascular;  Laterality: N/A;    SOCIAL HISTORY:   Social History  Substance Use Topics  . Smoking status: Former Smoker -- 0.50 packs/day for 40 years    Types: Cigarettes    Quit date: 11/23/2014  . Smokeless tobacco: Never  Used  . Alcohol Use: No    FAMILY HISTORY:   Family History  Problem Relation Age of Onset  . Heart disease Mother   . Cancer Father   . Cancer Sister     DRUG ALLERGIES:   Allergies  Allergen Reactions  . Sulfa Antibiotics Swelling, Rash and Other (See Comments)    Reaction:  Facial/body swelling     REVIEW OF SYSTEMS:   Review of Systems  Constitutional: Negative for fever, chills and weight loss.  HENT: Negative for ear discharge, ear pain and nosebleeds.   Eyes: Negative for blurred vision, pain and discharge.  Respiratory: Positive for shortness of breath. Negative for sputum production, wheezing and stridor.   Cardiovascular: Positive for chest pain. Negative for palpitations, orthopnea and PND.  Gastrointestinal: Negative for nausea, vomiting, abdominal pain and diarrhea.  Genitourinary: Negative for urgency and frequency.  Musculoskeletal: Negative for back pain and joint pain.  Neurological: Positive for weakness. Negative for sensory change, speech change and focal weakness.  Psychiatric/Behavioral: Negative for depression and hallucinations. The patient is not nervous/anxious.   All other systems reviewed and are negative.   CONSTITUTIONAL: No fever, fatigue or weakness.  EYES: No blurred or double vision.  EARS, NOSE, AND THROAT: No tinnitus or ear pain.  RESPIRATORY: No cough, shortness of breath, wheezing or hemoptysis.  CARDIOVASCULAR: No chest pain, orthopnea, edema.  GASTROINTESTINAL: No nausea, vomiting, diarrhea or abdominal pain.  GENITOURINARY: No dysuria, hematuria.  ENDOCRINE: No polyuria, nocturia,  HEMATOLOGY: No anemia, easy bruising or bleeding SKIN: No rash or lesion. MUSCULOSKELETAL: No joint pain or arthritis.   NEUROLOGIC: No tingling, numbness, weakness.  PSYCHIATRY: No anxiety or depression.   MEDICATIONS AT HOME:   Prior to Admission medications   Medication Sig Start Date End Date Taking? Authorizing Provider  albuterol  (PROVENTIL HFA;VENTOLIN HFA) 108 (90 BASE) MCG/ACT inhaler Inhale 2 puffs into the lungs every 6 (six) hours as needed for wheezing or shortness of breath.   Yes Historical Provider, MD  ALPRAZolam Duanne Moron) 0.5 MG tablet Take 0.5 mg by mouth 2 (two) times daily.   Yes Historical Provider, MD  amLODipine (NORVASC) 10 MG tablet Take 10 mg by mouth at bedtime.   Yes Historical Provider, MD  calcium acetate (PHOSLO) 667 MG capsule Take 667-1,334 mg by mouth See admin instructions. Pt takes two capsules three times daily with meals and one capsule two times daily with snacks.   Yes Historical Provider, MD  calcium carbonate (OS-CAL) 600 MG TABS tablet Take 600 mg by mouth 2 (two) times daily with a meal.   Yes Historical Provider, MD  carvedilol (COREG) 25 MG tablet Take 1 tablet (25 mg total) by mouth 2 (two) times daily with a meal. 04/26/16  Yes Sital Mody, MD  cinacalcet (SENSIPAR) 30 MG tablet Take 60 mg by mouth daily with breakfast.    Yes Historical Provider, MD  ezetimibe (ZETIA) 10 MG tablet Take 10 mg by mouth daily.    Yes Historical Provider, MD  fluticasone (FLONASE) 50 MCG/ACT nasal spray  Place 2 sprays into both nostrils daily as needed for rhinitis.   Yes Historical Provider, MD  Fluticasone-Salmeterol (ADVAIR) 250-50 MCG/DOSE AEPB Inhale 1 puff into the lungs 2 (two) times daily.   Yes Historical Provider, MD  furosemide (LASIX) 40 MG tablet Take 40 mg by mouth every Tuesday, Thursday, Saturday, and Sunday.    Yes Historical Provider, MD  lidocaine-prilocaine (EMLA) cream Apply 1 application topically as needed (prior to accessing port).   Yes Historical Provider, MD  losartan (COZAAR) 100 MG tablet Take 100 mg by mouth every Tuesday, Thursday, Saturday, and Sunday.    Yes Historical Provider, MD  multivitamin (RENA-VIT) TABS tablet Take 1 tablet by mouth at bedtime.    Yes Historical Provider, MD  omeprazole (PRILOSEC) 20 MG capsule Take 20 mg by mouth daily.    Yes Historical Provider, MD   sertraline (ZOLOFT) 50 MG tablet Take 50 mg by mouth daily.    Yes Historical Provider, MD  clopidogrel (PLAVIX) 75 MG tablet Take 1 tablet (75 mg total) by mouth daily. 05/02/16 05/02/17  Earleen Newport, MD      VITAL SIGNS:  Blood pressure 147/74, pulse 64, temperature 98.3 F (36.8 C), temperature source Oral, resp. rate 18, weight 60.782 kg (134 lb), SpO2 98 %.  PHYSICAL EXAMINATION:  GENERAL:  60 y.o.-year-old patient lying in the bed with no acute distress.  EYES: Pupils equal, round, reactive to light and accommodation. No scleral icterus. Extraocular muscles intact.  HEENT: Head atraumatic, normocephalic. Oropharynx and nasopharynx clear.  NECK:  Supple, no jugular venous distention. No thyroid enlargement, no tenderness.  LUNGS:Distant breath sounds bilaterally, no wheezing, rales,rhonchi or crepitation. No use of accessory muscles of respiration.  CARDIOVASCULAR: S1, S2 normal. No murmurs, rubs, or gallops.  ABDOMEN: Soft, nontender, nondistended. Bowel sounds present. No organomegaly or mass.  EXTREMITIES: No pedal edema, cyanosis, or clubbing.  NEUROLOGIC: Cranial nerves II through XII are intact. Muscle strength 5/5 in all extremities. Sensation intact. Gait not checked.  PSYCHIATRIC: The patient is alert and oriented x 3.  SKIN: No obvious rash, lesion, or ulcer.   LABORATORY PANEL:   CBC  Recent Labs Lab 05/02/16 1059  WBC 9.5  HGB 10.7*  HCT 32.4*  PLT 245   ------------------------------------------------------------------------------------------------------------------  Chemistries   Recent Labs Lab 05/02/16 1059  NA 137  K 4.8  CL 102  CO2 24  GLUCOSE 105*  BUN 56*  CREATININE 9.33*  CALCIUM 7.6*   ------------------------------------------------------------------------------------------------------------------  Cardiac Enzymes  Recent Labs Lab 05/02/16 1059  TROPONINI 0.14*    ------------------------------------------------------------------------------------------------------------------  RADIOLOGY:  Dg Chest 2 View  05/02/2016  CLINICAL DATA:  60 year old female with history of shortness of breath and chest pain starting earlier this morning after waking. Pressure and heaviness in the chest. EXAM: CHEST  2 VIEW COMPARISON:  Chest x-ray 04/25/2016. FINDINGS: Mild diffuse peribronchial cuffing. Lung volumes are within normal limits. No acute consolidative airspace disease. No pleural effusions. No pneumothorax. No definite suspicious appearing pulmonary nodules or masses. No cephalization of the pulmonary vasculature. Mild cardiomegaly. The patient is rotated to the right on today's exam, resulting in distortion of the mediastinal contours and reduced diagnostic sensitivity and specificity for mediastinal pathology. Atherosclerosis in the thoracic aorta. IMPRESSION: 1. Mild diffuse peribronchial cuffing may suggest an acute bronchitis. 2. Mild cardiomegaly. 3. Atherosclerosis. Electronically Signed   By: Vinnie Langton M.D.   On: 05/02/2016 11:45    EKG:   Orders placed or performed during the hospital encounter of 05/02/16  .  EKG 12-Lead  . EKG 12-Lead  . ED EKG within 10 minutes  . ED EKG within 10 minutes  . ED EKG  . ED EKG    IMPRESSION AND PLAN:   Michele Nash  is a 60 y.o. female with a known history of Incisional disease, hypertension, hyperlipidemia comes to the emergency room before going to dialysis with increasing shortness of breath and chest pain. Patient states her chest pain is resolved. She uses oxygen 24 7. She is hemodynamically stable. Her troponin last week was 0.0 4.05 today's appointment once 4. Patient's EKG shows normal sinus rhythm along with LAD and ordered Q waves in anterior leads.  1. Chest tightness with shortness of breath -Patient was seen in the emergency room appears hemodynamically stable. She is normal sinus rhythm on EKG  with LAD. No acute ST elevation or depression. Her troponin today is 1.14. -Chest x-ray does not show any evidence of volume overload. -Spoke with Dr. Yancey Flemings cardiology recommends patient be started on 75 mg Plavix and Imdur were 30 mg by mouth daily. Prescription will be given to patient. -CTA of the heart has been set up as outpatient by Dr. Humphrey Rolls. Cardiology does not recommend any admission at present. [facility] message was relayed with Dr. Jimmye Norman.  2. End-stage renal disease -Patient will go back to [facility] for her dialysis today after she is released from the emergency room  3. Hypertension continue home meds  4. GERD continue PPI  5. Chronic respiratory failure on home oxygen patient advised to continue the same.  Case d/w dr Humphrey Rolls  All the records are reviewed and case discussed with Consulting provider. Management plans discussed with the patient, family and they are in agreement.  CODE STATUS: full  TOTAL TIME TAKING CARE OF THIS PATIENT: 41minutes.    Daemyn Gariepy M.D on 05/02/2016 at 1:34 PM  Between 7am to 6pm - Pager - [phone]  After 6pm go to [email or web address] - password [email or web address] [facility]  [facility]  Office  [phone]  CC: Primary care Physician: [name], MD

## 2016-05-02 NOTE — ED Notes (Signed)
Pt to ed with c/o sob and chest pain that started this am after she awoke.  Pt reports she was supposed to get dialysis today but was having chest pain and they sent her here.  Pt reports pressure and heaviness in chest.  Pt on o2 at 3 lpm all the time.

## 2016-05-02 NOTE — ED Notes (Signed)
Patient taken to xray.

## 2016-05-02 NOTE — Discharge Instructions (Signed)
Nonspecific Chest Pain  °Chest pain can be caused by many different conditions. There is always a chance that your pain could be related to something serious, such as a heart attack or a blood clot in your lungs. Chest pain can also be caused by conditions that are not life-threatening. If you have chest pain, it is very important to follow up with your health care provider. °CAUSES  °Chest pain can be caused by: °· Heartburn. °· Pneumonia or bronchitis. °· Anxiety or stress. °· Inflammation around your heart (pericarditis) or lung (pleuritis or pleurisy). °· A blood clot in your lung. °· A collapsed lung (pneumothorax). It can develop suddenly on its own (spontaneous pneumothorax) or from trauma to the chest. °· Shingles infection (varicella-zoster virus). °· Heart attack. °· Damage to the bones, muscles, and cartilage that make up your chest wall. This can include: °¨ Bruised bones due to injury. °¨ Strained muscles or cartilage due to frequent or repeated coughing or overwork. °¨ Fracture to one or more ribs. °¨ Sore cartilage due to inflammation (costochondritis). °RISK FACTORS  °Risk factors for chest pain may include: °· Activities that increase your risk for trauma or injury to your chest. °· Respiratory infections or conditions that cause frequent coughing. °· Medical conditions or overeating that can cause heartburn. °· Heart disease or family history of heart disease. °· Conditions or health behaviors that increase your risk of developing a blood clot. °· Having had chicken pox (varicella zoster). °SIGNS AND SYMPTOMS °Chest pain can feel like: °· Burning or tingling on the surface of your chest or deep in your chest. °· Crushing, pressure, aching, or squeezing pain. °· Dull or sharp pain that is worse when you move, cough, or take a deep breath. °· Pain that is also felt in your back, neck, shoulder, or arm, or pain that spreads to any of these areas. °Your chest pain may come and go, or it may stay  constant. °DIAGNOSIS °Lab tests or other studies may be needed to find the cause of your pain. Your health care provider may have you take a test called an ambulatory ECG (electrocardiogram). An ECG records your heartbeat patterns at the time the test is performed. You may also have other tests, such as: °· Transthoracic echocardiogram (TTE). During echocardiography, sound waves are used to create a picture of all of the heart structures and to look at how blood flows through your heart. °· Transesophageal echocardiogram (TEE). This is a more advanced imaging test that obtains images from inside your body. It allows your health care provider to see your heart in finer detail. °· Cardiac monitoring. This allows your health care provider to monitor your heart rate and rhythm in real time. °· Holter monitor. This is a portable device that records your heartbeat and can help to diagnose abnormal heartbeats. It allows your health care provider to track your heart activity for several days, if needed. °· Stress tests. These can be done through exercise or by taking medicine that makes your heart beat more quickly. °· Blood tests. °· Imaging tests. °TREATMENT  °Your treatment depends on what is causing your chest pain. Treatment may include: °· Medicines. These may include: °¨ Acid blockers for heartburn. °¨ Anti-inflammatory medicine. °¨ Pain medicine for inflammatory conditions. °¨ Antibiotic medicine, if an infection is present. °¨ Medicines to dissolve blood clots. °¨ Medicines to treat coronary artery disease. °· Supportive care for conditions that do not require medicines. This may include: °¨ Resting. °¨ Applying heat   or cold packs to injured areas. °¨ Limiting activities until pain decreases. °HOME CARE INSTRUCTIONS °· If you were prescribed an antibiotic medicine, finish it all even if you start to feel better. °· Avoid any activities that bring on chest pain. °· Do not use any tobacco products, including  cigarettes, chewing tobacco, or electronic cigarettes. If you need help quitting, ask your health care provider. °· Do not drink alcohol. °· Take medicines only as directed by your health care provider. °· Keep all follow-up visits as directed by your health care provider. This is important. This includes any further testing if your chest pain does not go away. °· If heartburn is the cause for your chest pain, you may be told to keep your head raised (elevated) while sleeping. This reduces the chance that acid will go from your stomach into your esophagus. °· Make lifestyle changes as directed by your health care provider. These may include: °¨ Getting regular exercise. Ask your health care provider to suggest some activities that are safe for you. °¨ Eating a heart-healthy diet. A registered dietitian can help you to learn healthy eating options. °¨ Maintaining a healthy weight. °¨ Managing diabetes, if necessary. °¨ Reducing stress. °SEEK MEDICAL CARE IF: °· Your chest pain does not go away after treatment. °· You have a rash with blisters on your chest. °· You have a fever. °SEEK IMMEDIATE MEDICAL CARE IF:  °· Your chest pain is worse. °· You have an increasing cough, or you cough up blood. °· You have severe abdominal pain. °· You have severe weakness. °· You faint. °· You have chills. °· You have sudden, unexplained chest discomfort. °· You have sudden, unexplained discomfort in your arms, back, neck, or jaw. °· You have shortness of breath at any time. °· You suddenly start to sweat, or your skin gets clammy. °· You feel nauseous or you vomit. °· You suddenly feel light-headed or dizzy. °· Your heart begins to beat quickly, or it feels like it is skipping beats. °These symptoms may represent a serious problem that is an emergency. Do not wait to see if the symptoms will go away. Get medical help right away. Call your local emergency services (911 in the [place] Do not drive yourself to the hospital. °  °This  information is not intended to replace advice given to you by your health care provider. Make sure you discuss any questions you have with your health care provider. °  °Document Released: 08/22/2005 Document Revised: 12/03/2014 Document Reviewed: 06/18/2014 °Elsevier Interactive Patient Education ©2016 [employer] ° °

## 2016-05-17 ENCOUNTER — Other Ambulatory Visit: Payer: Self-pay | Admitting: Internal Medicine

## 2016-05-17 DIAGNOSIS — R1032 Left lower quadrant pain: Secondary | ICD-10-CM

## 2016-05-22 ENCOUNTER — Other Ambulatory Visit: Payer: Self-pay | Admitting: Internal Medicine

## 2016-05-24 ENCOUNTER — Ambulatory Visit
Admission: RE | Admit: 2016-05-24 | Discharge: 2016-05-24 | Disposition: A | Discharge status: Home / Self Care | Payer: Medicare Other | Source: Ambulatory Visit | Attending: Internal Medicine | Admitting: Internal Medicine

## 2016-05-24 DIAGNOSIS — N261 Atrophy of kidney (terminal): Secondary | ICD-10-CM | POA: Insufficient documentation

## 2016-05-24 DIAGNOSIS — R1030 Lower abdominal pain, unspecified: Secondary | ICD-10-CM | POA: Insufficient documentation

## 2016-05-24 DIAGNOSIS — N289 Disorder of kidney and ureter, unspecified: Secondary | ICD-10-CM | POA: Insufficient documentation

## 2016-05-24 DIAGNOSIS — Z90710 Acquired absence of both cervix and uterus: Secondary | ICD-10-CM | POA: Insufficient documentation

## 2016-05-24 DIAGNOSIS — M5136 Other intervertebral disc degeneration, lumbar region: Secondary | ICD-10-CM | POA: Insufficient documentation

## 2016-05-24 DIAGNOSIS — R1032 Left lower quadrant pain: Secondary | ICD-10-CM

## 2016-05-28 ENCOUNTER — Inpatient Hospital Stay
Admission: EM | Admit: 2016-05-28 | Discharge: 2016-06-02 | DRG: 377 | Disposition: A | Discharge status: Home / Self Care | Payer: Medicare Other | Attending: Internal Medicine | Admitting: Internal Medicine

## 2016-05-28 ENCOUNTER — Encounter: Payer: Self-pay | Admitting: Emergency Medicine

## 2016-05-28 ENCOUNTER — Inpatient Hospital Stay: Payer: Medicare Other

## 2016-05-28 DIAGNOSIS — G4730 Sleep apnea, unspecified: Secondary | ICD-10-CM | POA: Diagnosis present

## 2016-05-28 DIAGNOSIS — K219 Gastro-esophageal reflux disease without esophagitis: Secondary | ICD-10-CM | POA: Diagnosis present

## 2016-05-28 DIAGNOSIS — N186 End stage renal disease: Secondary | ICD-10-CM | POA: Diagnosis present

## 2016-05-28 DIAGNOSIS — I959 Hypotension, unspecified: Secondary | ICD-10-CM | POA: Diagnosis present

## 2016-05-28 DIAGNOSIS — Z8249 Family history of ischemic heart disease and other diseases of the circulatory system: Secondary | ICD-10-CM

## 2016-05-28 DIAGNOSIS — I5032 Chronic diastolic (congestive) heart failure: Secondary | ICD-10-CM | POA: Diagnosis present

## 2016-05-28 DIAGNOSIS — E1165 Type 2 diabetes mellitus with hyperglycemia: Secondary | ICD-10-CM | POA: Diagnosis present

## 2016-05-28 DIAGNOSIS — E669 Obesity, unspecified: Secondary | ICD-10-CM | POA: Diagnosis present

## 2016-05-28 DIAGNOSIS — Z882 Allergy status to sulfonamides status: Secondary | ICD-10-CM | POA: Diagnosis not present

## 2016-05-28 DIAGNOSIS — E1122 Type 2 diabetes mellitus with diabetic chronic kidney disease: Secondary | ICD-10-CM | POA: Diagnosis present

## 2016-05-28 DIAGNOSIS — I132 Hypertensive heart and chronic kidney disease with heart failure and with stage 5 chronic kidney disease, or end stage renal disease: Secondary | ICD-10-CM | POA: Diagnosis present

## 2016-05-28 DIAGNOSIS — R197 Diarrhea, unspecified: Secondary | ICD-10-CM | POA: Diagnosis present

## 2016-05-28 DIAGNOSIS — Z90710 Acquired absence of both cervix and uterus: Secondary | ICD-10-CM

## 2016-05-28 DIAGNOSIS — D500 Iron deficiency anemia secondary to blood loss (chronic): Secondary | ICD-10-CM | POA: Diagnosis present

## 2016-05-28 DIAGNOSIS — K921 Melena: Principal | ICD-10-CM | POA: Diagnosis present

## 2016-05-28 DIAGNOSIS — Z992 Dependence on renal dialysis: Secondary | ICD-10-CM | POA: Diagnosis not present

## 2016-05-28 DIAGNOSIS — Z809 Family history of malignant neoplasm, unspecified: Secondary | ICD-10-CM

## 2016-05-28 DIAGNOSIS — M546 Pain in thoracic spine: Secondary | ICD-10-CM | POA: Diagnosis present

## 2016-05-28 DIAGNOSIS — N2581 Secondary hyperparathyroidism of renal origin: Secondary | ICD-10-CM | POA: Diagnosis present

## 2016-05-28 DIAGNOSIS — D649 Anemia, unspecified: Secondary | ICD-10-CM

## 2016-05-28 DIAGNOSIS — R531 Weakness: Secondary | ICD-10-CM

## 2016-05-28 DIAGNOSIS — J9610 Chronic respiratory failure, unspecified whether with hypoxia or hypercapnia: Secondary | ICD-10-CM | POA: Diagnosis present

## 2016-05-28 DIAGNOSIS — K449 Diaphragmatic hernia without obstruction or gangrene: Secondary | ICD-10-CM | POA: Diagnosis present

## 2016-05-28 DIAGNOSIS — Z7982 Long term (current) use of aspirin: Secondary | ICD-10-CM

## 2016-05-28 DIAGNOSIS — Z79899 Other long term (current) drug therapy: Secondary | ICD-10-CM | POA: Diagnosis not present

## 2016-05-28 DIAGNOSIS — K269 Duodenal ulcer, unspecified as acute or chronic, without hemorrhage or perforation: Secondary | ICD-10-CM | POA: Diagnosis present

## 2016-05-28 DIAGNOSIS — J449 Chronic obstructive pulmonary disease, unspecified: Secondary | ICD-10-CM | POA: Diagnosis present

## 2016-05-28 DIAGNOSIS — E43 Unspecified severe protein-calorie malnutrition: Secondary | ICD-10-CM | POA: Insufficient documentation

## 2016-05-28 DIAGNOSIS — D631 Anemia in chronic kidney disease: Secondary | ICD-10-CM | POA: Diagnosis present

## 2016-05-28 DIAGNOSIS — Z9889 Other specified postprocedural states: Secondary | ICD-10-CM

## 2016-05-28 DIAGNOSIS — R739 Hyperglycemia, unspecified: Secondary | ICD-10-CM

## 2016-05-28 DIAGNOSIS — Z87891 Personal history of nicotine dependence: Secondary | ICD-10-CM | POA: Diagnosis not present

## 2016-05-28 DIAGNOSIS — Z7951 Long term (current) use of inhaled steroids: Secondary | ICD-10-CM | POA: Diagnosis not present

## 2016-05-28 LAB — CBC WITH DIFFERENTIAL/PLATELET
BASOS ABS: 0.1 10*3/uL (ref 0–0.1)
Eosinophils Absolute: 0.2 10*3/uL (ref 0–0.7)
HCT: 14.9 % — CL (ref 35.0–47.0)
Hemoglobin: 5 g/dL — ABNORMAL LOW (ref 12.0–16.0)
Lymphocytes Relative: 9 %
Lymphs Abs: 0.9 10*3/uL — ABNORMAL LOW (ref 1.0–3.6)
MCH: 33.8 pg (ref 26.0–34.0)
MCHC: 33.2 g/dL (ref 32.0–36.0)
MCV: 101.9 fL — AB (ref 80.0–100.0)
MONO ABS: 0.6 10*3/uL (ref 0.2–0.9)
Monocytes Relative: 6 %
Neutro Abs: 8 10*3/uL — ABNORMAL HIGH (ref 1.4–6.5)
Neutrophils Relative %: 82 %
PLATELETS: 248 10*3/uL (ref 150–440)
RBC: 1.46 MIL/uL — ABNORMAL LOW (ref 3.80–5.20)
RDW: 14.1 % (ref 11.5–14.5)
WBC: 9.8 10*3/uL (ref 3.6–11.0)

## 2016-05-28 LAB — COMPREHENSIVE METABOLIC PANEL
ALBUMIN: 3.2 g/dL — AB (ref 3.5–5.0)
ALT: 15 U/L (ref 14–54)
AST: 23 U/L (ref 15–41)
Alkaline Phosphatase: 82 U/L (ref 38–126)
Anion gap: 15 (ref 5–15)
BUN: 80 mg/dL — AB (ref 6–20)
CHLORIDE: 97 mmol/L — AB (ref 101–111)
CO2: 23 mmol/L (ref 22–32)
Calcium: 7 mg/dL — ABNORMAL LOW (ref 8.9–10.3)
Creatinine, Ser: 11.31 mg/dL — ABNORMAL HIGH (ref 0.44–1.00)
GFR calc Af Amer: 4 mL/min — ABNORMAL LOW (ref >60)
GFR calc non Af Amer: 3 mL/min — ABNORMAL LOW (ref >60)
GLUCOSE: 203 mg/dL — AB (ref 65–99)
POTASSIUM: 4.7 mmol/L (ref 3.5–5.1)
Sodium: 135 mmol/L (ref 135–145)
Total Bilirubin: 0.4 mg/dL (ref 0.3–1.2)
Total Protein: 5.7 g/dL — ABNORMAL LOW (ref 6.5–8.1)

## 2016-05-28 LAB — IRON AND TIBC
IRON: 46 ug/dL (ref 28–170)
SATURATION RATIOS: 18 % (ref 10.4–31.8)
TIBC: 251 ug/dL (ref 250–450)
UIBC: 205 ug/dL

## 2016-05-28 LAB — PHOSPHORUS: PHOSPHORUS: 6.4 mg/dL — AB (ref 2.5–4.6)

## 2016-05-28 LAB — TROPONIN I: Troponin I: <0.03 ng/mL (ref <0.03)

## 2016-05-28 LAB — HEMOGLOBIN AND HEMATOCRIT, BLOOD
HEMATOCRIT: 17.3 % — AB (ref 35.0–47.0)
HEMOGLOBIN: 5.9 g/dL — AB (ref 12.0–16.0)

## 2016-05-28 LAB — HEMOGLOBIN A1C

## 2016-05-28 LAB — PROTIME-INR
INR: 1.1
PROTHROMBIN TIME: 14.4 s (ref 11.4–15.0)

## 2016-05-28 LAB — PREPARE RBC (CROSSMATCH)

## 2016-05-28 LAB — FERRITIN: FERRITIN: 413 ng/mL — AB (ref 11–307)

## 2016-05-28 MED ORDER — CALCIUM ACETATE (PHOS BINDER) 667 MG PO CAPS: 667.0000 mg | ORAL_CAPSULE | Freq: Two times a day (BID) | ORAL | Status: DC | PRN

## 2016-05-28 MED ORDER — EZETIMIBE 10 MG PO TABS
10.0000 mg | ORAL_TABLET | Freq: Every day | ORAL | Status: DC
  Administered 2016-05-29 – 2016-06-01 (×4): 10 mg via ORAL
  Filled 2016-05-28 (×5): qty 1

## 2016-05-28 MED ORDER — ALPRAZOLAM 0.5 MG PO TABS
0.5000 mg | ORAL_TABLET | Freq: Two times a day (BID) | ORAL | Status: DC
  Administered 2016-05-28 – 2016-06-01 (×9): 0.5 mg via ORAL
  Filled 2016-05-28 (×9): qty 1

## 2016-05-28 MED ORDER — PANTOPRAZOLE SODIUM 40 MG PO TBEC
40.0000 mg | DELAYED_RELEASE_TABLET | Freq: Two times a day (BID) | ORAL | Status: DC
Start: 2016-05-28 — End: 2016-06-02
  Administered 2016-05-28 – 2016-06-01 (×9): 40 mg via ORAL
  Filled 2016-05-28 (×9): qty 1

## 2016-05-28 MED ORDER — ONDANSETRON HCL 4 MG/2ML IJ SOLN: 4.0000 mg | Freq: Four times a day (QID) | INTRAMUSCULAR | Status: DC | PRN

## 2016-05-28 MED ORDER — RENA-VITE PO TABS
1.0000 | ORAL_TABLET | Freq: Every day | ORAL | Status: DC
  Administered 2016-05-28 – 2016-06-01 (×5): 1 via ORAL
  Filled 2016-05-28 (×5): qty 1

## 2016-05-28 MED ORDER — MOMETASONE FURO-FORMOTEROL FUM 200-5 MCG/ACT IN AERO
2.0000 | INHALATION_SPRAY | Freq: Two times a day (BID) | RESPIRATORY_TRACT | Status: DC
  Administered 2016-05-28 – 2016-06-01 (×9): 2 via RESPIRATORY_TRACT
  Filled 2016-05-28: qty 8.8

## 2016-05-28 MED ORDER — SODIUM CHLORIDE 0.9 % IV SOLN: 10.0000 mL/h | Freq: Once | INTRAVENOUS | Status: DC

## 2016-05-28 MED ORDER — CINACALCET HCL 30 MG PO TABS
60.0000 mg | ORAL_TABLET | Freq: Every day | ORAL | Status: DC
  Administered 2016-05-29 – 2016-05-31 (×3): 60 mg via ORAL
  Filled 2016-05-28 (×4): qty 2

## 2016-05-28 MED ORDER — ALBUTEROL SULFATE HFA 108 (90 BASE) MCG/ACT IN AERS: 2.0000 | INHALATION_SPRAY | Freq: Four times a day (QID) | RESPIRATORY_TRACT | Status: DC | PRN

## 2016-05-28 MED ORDER — SODIUM CHLORIDE 0.9% FLUSH
3.0000 mL | Freq: Two times a day (BID) | INTRAVENOUS | Status: DC
  Administered 2016-05-28 – 2016-06-01 (×10): 3 mL via INTRAVENOUS

## 2016-05-28 MED ORDER — ONDANSETRON HCL 4 MG PO TABS: 4.0000 mg | ORAL_TABLET | Freq: Four times a day (QID) | ORAL | Status: DC | PRN

## 2016-05-28 MED ORDER — SODIUM CHLORIDE 0.9 % IV SOLN: 100.0000 mL | INTRAVENOUS | Status: DC | PRN

## 2016-05-28 MED ORDER — FLUTICASONE PROPIONATE 50 MCG/ACT NA SUSP
2.0000 | Freq: Every day | NASAL | Status: DC | PRN
  Filled 2016-05-28: qty 16

## 2016-05-28 MED ORDER — HEPARIN SODIUM (PORCINE) 1000 UNIT/ML DIALYSIS
1000.0000 [IU] | INTRAMUSCULAR | Status: DC | PRN
  Filled 2016-05-28: qty 1

## 2016-05-28 MED ORDER — ACETAMINOPHEN 325 MG PO TABS
650.0000 mg | ORAL_TABLET | Freq: Four times a day (QID) | ORAL | Status: DC | PRN
  Administered 2016-05-28 – 2016-06-01 (×4): 650 mg via ORAL
  Filled 2016-05-28 (×3): qty 2

## 2016-05-28 MED ORDER — ACETAMINOPHEN 650 MG RE SUPP: 650.0000 mg | Freq: Four times a day (QID) | RECTAL | Status: DC | PRN

## 2016-05-28 MED ORDER — LIDOCAINE-PRILOCAINE 2.5-2.5 % EX CREA
1.0000 "application " | TOPICAL_CREAM | CUTANEOUS | Status: DC | PRN
  Filled 2016-05-28: qty 5

## 2016-05-28 MED ORDER — PANTOPRAZOLE SODIUM 40 MG PO TBEC: 40.0000 mg | DELAYED_RELEASE_TABLET | Freq: Every day | ORAL | Status: DC

## 2016-05-28 MED ORDER — LIDOCAINE HCL (PF) 1 % IJ SOLN
5.0000 mL | INTRAMUSCULAR | Status: DC | PRN
Start: 2016-05-28 — End: 2016-05-28
  Filled 2016-05-28: qty 5

## 2016-05-28 MED ORDER — FUROSEMIDE 40 MG PO TABS
40.0000 mg | ORAL_TABLET | ORAL | Status: DC
  Administered 2016-05-29 – 2016-05-31 (×2): 40 mg via ORAL
  Filled 2016-05-28 (×2): qty 1

## 2016-05-28 MED ORDER — ALBUTEROL SULFATE (2.5 MG/3ML) 0.083% IN NEBU: 2.5000 mg | INHALATION_SOLUTION | Freq: Four times a day (QID) | RESPIRATORY_TRACT | Status: DC | PRN

## 2016-05-28 MED ORDER — SERTRALINE HCL 50 MG PO TABS
50.0000 mg | ORAL_TABLET | Freq: Every day | ORAL | Status: DC
  Administered 2016-05-29 – 2016-06-01 (×4): 50 mg via ORAL
  Filled 2016-05-28 (×4): qty 1

## 2016-05-28 MED ORDER — CALCIUM CARBONATE ANTACID 500 MG PO CHEW
500.0000 mg | CHEWABLE_TABLET | Freq: Two times a day (BID) | ORAL | Status: DC
  Administered 2016-05-29 – 2016-06-01 (×7): 500 mg via ORAL
  Filled 2016-05-28 (×7): qty 1

## 2016-05-28 MED ORDER — CALCIUM ACETATE (PHOS BINDER) 667 MG PO CAPS
1334.0000 mg | ORAL_CAPSULE | Freq: Three times a day (TID) | ORAL | Status: DC
  Administered 2016-05-29 – 2016-06-01 (×10): 1334 mg via ORAL
  Filled 2016-05-28 (×10): qty 2

## 2016-05-28 NOTE — Progress Notes (Signed)
Per Dr. Ether Griffins okay for pt to have sips with meds

## 2016-05-28 NOTE — ED Notes (Signed)
Informed RN bed ready  1430

## 2016-05-28 NOTE — Progress Notes (Signed)
Per GI PA [facility] order for hemoglobin and hematocrit to be drawn 1 hour after receiving blood. Then results to be called to [facility]

## 2016-05-28 NOTE — Consult Note (Signed)
Consultation  Referring Provider:     Dr Ether Griffins Admit date: 05/28/16 Consult date        05/28/16 Reason for Consultation:     Symptomatic anema         HPI:   Michele Nash is a 60 y.o. female with history of ESRD on dialysis Admitted with symptomatic anemia and concerns of melena. Admit hgb 5.0. Has been on plavix. hgb last month was 10.8.  CT 4d ago with: " IMPRESSION: 1. No nephrolithiasis. No hydronephrosis or hydroureter. Again noted bilateral renal atrophy with lobulated contour and cortical thinning. Stable hyperdense bilateral renal lesions. These cannot be characterized without IV contrast. 2. No calcified ureteral calculi. Limited assessment of urinary bladder which is empty. Status post hysterectomy. 3. Degenerative changes lumbar spine. 4. No small bowel or colonic obstruction. No pericecal inflammation. Normal appendix. 5. No evidence of colitis or diverticulitis."  States she developed some black stools on Saturday- felt nauseated at that time. Both these symptoms resolved, however developed increasing fatigue, DOE/sob, malaise. Reports she doesn't have any abdominal pain, emesis, diarrhea/constipation. Was found to be very anemia and advised to go to ED for this. No history of EGD. Denies NSAIDs. Takes Omeprazole 20mg  po qd at home. She is hemodynamically stable and afebrile. Currently on dialysis. Denies further GI related complaints.    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   Past Medical History  Diagnosis Date  . Chronic kidney disease   . COPD (chronic obstructive pulmonary disease) ([place])   . Hypertension   . Hyperlipidemia   . Depression   . Dialysis patient ([place]) 2014  . Obesity   . Bronchitis   . Headache   . GERD (gastroesophageal reflux disease)   . Arthritis   . Anemia   . Renal dialysis device, implant, or graft complication     RIGHT CHEST CATH  . Apnea, sleep      for sleep study 10/17/15-no cpap yet  . Diabetes mellitus without complication ([place])     no meds    Past Surgical History  Procedure Laterality Date  . Abdominal hysterectomy    . Dialysis fistula creation    . Colonoscopy  2011  . Peripheral vascular catheterization N/A 05/10/2015    Procedure: A/V Shuntogram/Fistulagram;  Surgeon: Katha Cabal, MD;  Location: [place] [facility];  Service: Cardiovascular;  Laterality: N/A;  . Peripheral vascular catheterization Left 05/10/2015    Procedure: A/V Shunt Intervention;  Surgeon: Katha Cabal, MD;  Location: [place] [facility];  Service: Cardiovascular;  Laterality: Left;  . Peripheral vascular catheterization Left 08/23/2015    Procedure: A/V Shuntogram/Fistulagram;  Surgeon: Katha Cabal, MD;  Location: [place] [facility];  Service: Cardiovascular;  Laterality: Left;  . Peripheral vascular catheterization N/A 08/23/2015    Procedure: A/V Shunt Intervention;  Surgeon: Katha Cabal, MD;  Location: [place] [facility];  Service: Cardiovascular;  Laterality: N/A;  . Peripheral vascular catheterization  08/23/2015    Procedure: Dialysis/Perma Catheter Insertion;  Surgeon: Katha Cabal, MD;  Location: [place] [facility];  Service: Cardiovascular;;  . Colonoscopy with propofol N/A 09/20/2015    Procedure: COLONOSCOPY WITH PROPOFOL;  Surgeon: Lucilla Lame, MD;  Location: [facility];  Service: Endoscopy;  Laterality: N/A;  . Revison of arteriovenous fistula Left 10/28/2015    Procedure: REVISON OF ARTERIOVENOUS FISTULA WITH ARTEGRAFT;  Surgeon: Dolores Lory  Schnier, MD;  Location: [facility];  Service: Vascular;  Laterality: Left;  . Wound debridement Left 10/28/2015    Procedure: Resection of shoulder cyst ( left );  Surgeon: Katha Cabal, MD;  Location: [facility];  Service: Vascular;  Laterality: Left;  . Peripheral vascular catheterization N/A 01/03/2016    Procedure: Dialysis/Perma Catheter Removal;  Surgeon: Katha Cabal, MD;  Location: [place] [facility];  Service: Cardiovascular;  Laterality: N/A;    Family History  Problem Relation Age of Onset  . Heart disease Mother   . Cancer Father   . Cancer Sister     Social History  Substance Use Topics  . Smoking status: Former Smoker -- 0.50 packs/day for 40 years    Types: Cigarettes    Quit date: 11/23/2014  . Smokeless tobacco: Never Used  . Alcohol Use: No    Prior to Admission medications   Medication Sig Start Date End Date Taking? Authorizing Provider  albuterol (PROVENTIL HFA;VENTOLIN HFA) 108 (90 BASE) MCG/ACT inhaler Inhale 2 puffs into the lungs every 6 (six) hours as needed for wheezing or shortness of breath.   Yes Historical Provider, MD  ALPRAZolam Duanne Moron) 0.5 MG tablet Take 0.5 mg by mouth 2 (two) times daily.   Yes Historical Provider, MD  amLODipine (NORVASC) 10 MG tablet Take 10 mg by mouth at bedtime.   Yes Historical Provider, MD  calcium acetate (PHOSLO) 667 MG capsule Take 667-1,334 mg by mouth See admin instructions. Pt takes two capsules three times daily with meals and one capsule two times daily with snacks.   Yes Historical Provider, MD  calcium carbonate (OS-CAL) 600 MG TABS tablet Take 600 mg by mouth 2 (two) times daily with a meal.   Yes Historical Provider, MD  carvedilol (COREG) 25 MG tablet Take 1 tablet (25 mg total) by mouth 2 (two) times daily with a meal. 04/26/16  Yes Bettey Costa, MD  cinacalcet (SENSIPAR) 30 MG tablet Take 60 mg by mouth daily with breakfast.    Yes Historical Provider, MD  clopidogrel (PLAVIX) 75 MG tablet Take 1 tablet (75 mg total) by mouth daily. 05/02/16 05/02/17 Yes Earleen Newport, MD  ezetimibe (ZETIA) 10 MG tablet Take 10 mg by mouth daily.    Yes Historical Provider, MD  fluticasone (FLONASE) 50 MCG/ACT nasal spray Place 2 sprays into both nostrils daily as needed for rhinitis.   Yes Historical Provider, MD  Fluticasone-Salmeterol (ADVAIR) 250-50 MCG/DOSE AEPB Inhale 1 puff into  the lungs 2 (two) times daily.   Yes Historical Provider, MD  furosemide (LASIX) 40 MG tablet Take 40 mg by mouth every Tuesday, Thursday, Saturday, and Sunday.    Yes Historical Provider, MD  lidocaine-prilocaine (EMLA) cream Apply 1 application topically as needed (prior to accessing port).   Yes Historical Provider, MD  losartan (COZAAR) 100 MG tablet Take 100 mg by mouth every Tuesday, Thursday, Saturday, and Sunday.    Yes Historical Provider, MD  multivitamin (RENA-VIT) TABS tablet Take 1 tablet by mouth at bedtime.    Yes Historical Provider, MD  omeprazole (PRILOSEC) 20 MG capsule Take 20 mg by mouth daily.    Yes Historical Provider, MD  sertraline (ZOLOFT) 50 MG tablet Take 50 mg by mouth daily.    Yes Historical Provider, MD    Current Facility-Administered Medications  Medication Dose Route Frequency Provider Last Rate Last Dose  . 0.9 %  sodium chloride infusion  10 mL/hr Intravenous Once Earleen Newport, MD      .  0.9 %  sodium chloride infusion  100 mL Intravenous PRN Munsoor Lateef, MD      . 0.9 %  sodium chloride infusion  100 mL Intravenous PRN Munsoor Lateef, MD      . acetaminophen (TYLENOL) tablet 650 mg  650 mg Oral Q6H PRN Theodoro Grist, MD       Or  . acetaminophen (TYLENOL) suppository 650 mg  650 mg Rectal Q6H PRN Theodoro Grist, MD      . albuterol (PROVENTIL) (2.5 MG/3ML) 0.083% nebulizer solution 2.5 mg  2.5 mg Nebulization Q6H PRN Theodoro Grist, MD      . ALPRAZolam Duanne Moron) tablet 0.5 mg  0.5 mg Oral BID Theodoro Grist, MD   0.5 mg at 05/28/16 1616  . calcium acetate (PHOSLO) capsule 1,334 mg  1,334 mg Oral TID WC Theodoro Grist, MD      . calcium acetate (PHOSLO) capsule 667 mg  667 mg Oral BID PRN Theodoro Grist, MD      . calcium carbonate (TUMS - dosed in mg elemental calcium) chewable tablet 500 mg  500 mg Oral BID WC Theodoro Grist, MD      . Derrill Memo ON 05/29/2016] cinacalcet (SENSIPAR) tablet 60 mg  60 mg Oral Q breakfast Theodoro Grist, MD      . Derrill Memo ON  05/29/2016] ezetimibe (ZETIA) tablet 10 mg  10 mg Oral Daily Theodoro Grist, MD      . fluticasone (FLONASE) 50 MCG/ACT nasal spray 2 spray  2 spray Each Nare Daily PRN Theodoro Grist, MD      . Derrill Memo ON 05/29/2016] furosemide (LASIX) tablet 40 mg  40 mg Oral Q T,Th,S,Su Theodoro Grist, MD      . heparin injection 1,000 Units  1,000 Units Dialysis PRN Munsoor Lateef, MD      . lidocaine (PF) (XYLOCAINE) 1 % injection 5 mL  5 mL Intradermal PRN Munsoor Lateef, MD      . lidocaine-prilocaine (EMLA) cream 1 application  1 application Topical PRN Theodoro Grist, MD      . lidocaine-prilocaine (EMLA) cream 1 application  1 application Topical PRN Munsoor Lateef, MD      . mometasone-formoterol (DULERA) 200-5 MCG/ACT inhaler 2 puff  2 puff Inhalation BID Theodoro Grist, MD      . multivitamin (RENA-VIT) tablet 1 tablet  1 tablet Oral QHS Theodoro Grist, MD      . ondansetron (ZOFRAN) tablet 4 mg  4 mg Oral Q6H PRN Theodoro Grist, MD       Or  . ondansetron (ZOFRAN) injection 4 mg  4 mg Intravenous Q6H PRN Theodoro Grist, MD      . pantoprazole (PROTONIX) EC tablet 40 mg  40 mg Oral BID Theodoro Grist, MD   40 mg at 05/28/16 1617  . [START ON 05/29/2016] sertraline (ZOLOFT) tablet 50 mg  50 mg Oral Daily Theodoro Grist, MD      . sodium chloride flush (NS) 0.9 % injection 3 mL  3 mL Intravenous Q12H Theodoro Grist, MD   3 mL at 05/28/16 1500    Allergies as of 05/28/2016 - Review Complete 05/28/2016  Allergen Reaction Noted  . Sulfa antibiotics Swelling, Rash, and Other (See Comments) 10/27/2013     Review of Systems:    All systems reviewed and negative except where noted in HPI. Negative nuclear stress test 04/26/16    Physical Exam:  Vital signs in last 24 hours: Temp:  [97.7 F (36.5 C)-97.9 F (36.6 C)] 97.7 F (36.5 C) (07/03 1500)  Pulse Rate:  [59-70] 70 (07/03 1552) Resp:  [16-22] 18 (07/03 1552) BP: (95-123)/(49-68) 100/50 mmHg (07/03 1552) SpO2:  [96 %-100 %] 96 % (07/03 1500) Weight:  [59.194  kg (130 lb 8 oz)] 59.194 kg (130 lb 8 oz) (07/03 1108)   General:   Pleasant elderly woman in NAD Head:  Normocephalic and atraumatic. Eyes:   No icterus.   Conjunctiva pale. Ears:  Normal auditory acuity. Mouth: Mucosa pink moist, no lesions. Neck:  Supple; no masses felt Lungs: Respirations even and unlabored. Lungs clear to auscultation bilaterally.   No wheezes, crackles, or rhonchi.  Heart:  S1S2, RRR, no MRG. No edema. Abdomen:   Flat, soft, nondistended, nontender. Normal bowel sounds. No appreciable masses or hepatomegaly. No rebound signs or other peritoneal signs. Msk:  MAEW x4, No clubbing or cyanosis. Strength 5/5. Symmetrical without gross deformities. Neurologic:  Alert and  oriented x4;  Cranial nerves II-XII intact.  Skin:  Warm, dry, pale without significant lesions or rashes. Psych:  Alert and cooperative. Normal affect.  LAB RESULTS:  Recent Labs  05/28/16 1122  WBC 9.8  HGB 5.0*  HCT 14.9*  PLT 248   BMET  Recent Labs  05/28/16 1122  NA 135  K 4.7  CL 97*  CO2 23  GLUCOSE 203*  BUN 80*  CREATININE 11.31*  CALCIUM 7.0*   LFT  Recent Labs  05/28/16 1122  PROT 5.7*  ALBUMIN 3.2*  AST 23  ALT 15  ALKPHOS 82  BILITOT 0.4   PT/INR  Recent Labs  05/28/16 1122  LABPROT 14.4  INR 1.10    STUDIES: Dg Thoracic Spine 2 View  05/28/2016  CLINICAL DATA:  Weakness. EXAM: THORACIC SPINE 2 VIEWS COMPARISON:  05/02/2016 FINDINGS: The alignment of the thoracic spine appears normal. The vertebral body heights are well preserved. There is no fracture or subluxation identified. Aortic atherosclerosis identified. IMPRESSION: 1. No acute cardiopulmonary abnormalities. Electronically Signed   By: Kerby Moors M.D.   On: 05/28/2016 14:02       Impression / Plan:   1. Symptomatic anemia: report of melena and mild dyspepsia. Continue PPI,Agree with transfusions, would continue to follow hgb and transfuse as necessary. Patient unsure about if she is on  Plavix or not. Called her pharmacy: rx wrote 6/7 and pharmacy staff indicate it was picked up, so presumably she has been taking this. Do think she will need EGD, however will need to be off Plavix 5-7d. She is unsure when she last took this medication.  Thank you very much for this consult. These services were provided by Stephens November, NP-C, in collaboration with Lollie Sails, MD, with whom I have discussed this patient in full.   Stephens November, NP-C

## 2016-05-28 NOTE — Consult Note (Signed)
Please see full GI consult by Ms Jacqulyn Liner.  Patient admitted with symptomatic anemia, report of black stool.  Patietn states her last bm was Saturday, black in colon, but no bm since.  No emesis or nausea.  hPatietn with relatively recent colonoscoopy 5/16 with removal of a adenomatous polyp at that time.   Patient just back from dialysis, sleepy, mild epigastric discomfort.   Patietn has been on plavix at home since 05/02/16 and cannot accurately verify what medicines she takes.  Patietn will need to be off plavix for 5-7 days before EGD.   Will follow with you.  EGD when clinically feasible, continue ppi and serial hgb with tfx as necessary.

## 2016-05-28 NOTE — Progress Notes (Signed)
Post hd tx 

## 2016-05-28 NOTE — ED Provider Notes (Signed)
[facility] Emergency Department Provider Note        Time seen: ----------------------------------------- 11:31 AM on 05/28/2016 -----------------------------------------    I have reviewed the triage vital signs and the nursing notes.   HISTORY  Chief Complaint Weakness    HPI Michele Nash is a 60 y.o. female who presents to ER for reported weakness and low hemoglobin. We are unsure what her hemoglobin level is, patient is not sure. She's been feeling weak for the last 7 days. Reportedly she was due for dialysis this morning, her dialysis center told her not to calm with her level of anemia. She denies any recent illness, does complain of chronic pain at this time.   Past Medical History  Diagnosis Date  . Chronic kidney disease   . COPD (chronic obstructive pulmonary disease) ([place])   . Hypertension   . Hyperlipidemia   . Depression   . Dialysis patient ([place]) 2014  . Obesity   . Bronchitis   . Headache   . GERD (gastroesophageal reflux disease)   . Arthritis   . Anemia   . Renal dialysis device, implant, or graft complication     RIGHT CHEST CATH  . Apnea, sleep     for sleep study 10/17/15-no cpap yet  . Diabetes mellitus without complication ([place])     no meds    Patient Active Problem List   Diagnosis Date Noted  . Chest pain 04/25/2016  . Acute respiratory failure with hypoxia ([place]) 10/28/2015  . Pain in limb 10/28/2015  . End stage renal disease ([place]) 10/28/2015  . Essential hypertension 10/28/2015  . Hypoxemia 10/28/2015  . ESRD on hemodialysis ([place]) 03/29/2015  . HTN (hypertension) 03/29/2015  . COPD (chronic obstructive pulmonary disease) ([place]) 03/29/2015  . GAD (generalized anxiety disorder) 03/29/2015    Past Surgical History  Procedure Laterality Date  . Abdominal hysterectomy    . Dialysis fistula creation    . Colonoscopy  2011  . Peripheral vascular catheterization N/A 05/10/2015    Procedure: A/V  Shuntogram/Fistulagram;  Surgeon: Katha Cabal, MD;  Location: [place] [facility];  Service: Cardiovascular;  Laterality: N/A;  . Peripheral vascular catheterization Left 05/10/2015    Procedure: A/V Shunt Intervention;  Surgeon: Katha Cabal, MD;  Location: [place] [facility];  Service: Cardiovascular;  Laterality: Left;  . Peripheral vascular catheterization Left 08/23/2015    Procedure: A/V Shuntogram/Fistulagram;  Surgeon: Katha Cabal, MD;  Location: [place] [facility];  Service: Cardiovascular;  Laterality: Left;  . Peripheral vascular catheterization N/A 08/23/2015    Procedure: A/V Shunt Intervention;  Surgeon: Katha Cabal, MD;  Location: [place] [facility];  Service: Cardiovascular;  Laterality: N/A;  . Peripheral vascular catheterization  08/23/2015    Procedure: Dialysis/Perma Catheter Insertion;  Surgeon: Katha Cabal, MD;  Location: [place] [facility];  Service: Cardiovascular;;  . Colonoscopy with propofol N/A 09/20/2015    Procedure: COLONOSCOPY WITH PROPOFOL;  Surgeon: Lucilla Lame, MD;  Location: [facility];  Service: Endoscopy;  Laterality: N/A;  . Revison of arteriovenous fistula Left 10/28/2015    Procedure: REVISON OF ARTERIOVENOUS FISTULA WITH ARTEGRAFT;  Surgeon: Katha Cabal, MD;  Location: [facility];  Service: Vascular;  Laterality: Left;  . Wound debridement Left 10/28/2015    Procedure: Resection of shoulder cyst ( left );  Surgeon: Katha Cabal, MD;  Location: [facility];  Service: Vascular;  Laterality: Left;  . Peripheral vascular catheterization N/A 01/03/2016    Procedure:  Dialysis/Perma Catheter Removal;  Surgeon: Katha Cabal, MD;  Location: [place] [facility];  Service: Cardiovascular;  Laterality: N/A;    Allergies Sulfa antibiotics  Social History Social History  Substance Use Topics  . Smoking status: Former Smoker -- 0.50 packs/day for 40 years    Types: Cigarettes    Quit date: 11/23/2014  . Smokeless  tobacco: Never Used  . Alcohol Use: No    Review of Systems Constitutional: Negative for fever. Cardiovascular: Negative for chest pain. Respiratory: Negative for shortness of breath. Gastrointestinal: Negative for abdominal pain, vomiting and diarrhea. Genitourinary: Negative for dysuria. Musculoskeletal: Negative for back pain. Skin: Negative for rash. Neurological: Positive for weakness  10-point ROS otherwise negative.  ____________________________________________   PHYSICAL EXAM:  VITAL SIGNS: ED Triage Vitals  Enc Vitals Group     BP 05/28/16 1108 108/59 mmHg     Pulse Rate 05/28/16 1108 69     Resp 05/28/16 1108 18     Temp 05/28/16 1108 97.9 F (36.6 C)     Temp Source 05/28/16 1108 Oral     SpO2 05/28/16 1108 100 %     Weight 05/28/16 1108 130 lb 8 oz (59.194 kg)     Height 05/28/16 1108 5\' 5"  (1.651 m)     Head Cir --      Peak Flow --      Pain Score 05/28/16 1110 0     Pain Loc --      Pain Edu? --      Excl. in [place]? --     Constitutional: Alert and oriented. Well appearing and in no distress. Eyes: Conjunctivae are Pale. PERRL. Normal extraocular movements. ENT   Head: Normocephalic and atraumatic.   Nose: No congestion/rhinnorhea.   Mouth/Throat: Mucous membranes are moist.   Neck: No stridor.JVD is noted Cardiovascular: Normal rate, regular rhythm. Systolic murmur Respiratory: Normal respiratory effort without tachypnea nor retractions. Breath sounds are clear and equal bilaterally. No wheezes/rales/rhonchi. Gastrointestinal: Soft and nontender. Normal bowel sounds Musculoskeletal: Nontender with normal range of motion in all extremities. No lower extremity tenderness nor edema. Neurologic:  Normal speech and language. No gross focal neurologic deficits are appreciated.  Skin:  Skin is warm, dry and intact. Pallor is noted Psychiatric: Mood and affect are normal. Speech and behavior are normal.   ____________________________________________  EKG: Interpreted by me.Sinus rhythm rate of 62 bpm, normal axis, likely anterior infarct age indeterminate. No evidence of acute infarction. Normal intervals.  ____________________________________________  ED COURSE:  Pertinent labs & imaging results that were available during my care of the patient were reviewed by me and considered in my medical decision making (see chart for details). Patient presents to the ER with weakness, likely anemia related. We'll check basic labs and reevaluate. ____________________________________________    LABS (pertinent positives/negatives)  Labs Reviewed  CBC WITH DIFFERENTIAL/PLATELET - Abnormal; Notable for the following:    RBC 1.46 (*)    Hemoglobin 5.0 (*)    HCT 14.9 (*)    MCV 101.9 (*)    Neutro Abs 8.0 (*)    Lymphs Abs 0.9 (*)    All other components within normal limits  COMPREHENSIVE METABOLIC PANEL - Abnormal; Notable for the following:    Chloride 97 (*)    Glucose, Bld 203 (*)    BUN 80 (*)    Creatinine, Ser 11.31 (*)    Calcium 7.0 (*)    Total Protein 5.7 (*)    Albumin 3.2 (*)  GFR calc non Af Amer 3 (*)    GFR calc Af Amer 4 (*)    All other components within normal limits  TROPONIN I  PREPARE RBC (CROSSMATCH)   CRITICAL CARE Performed by: Earleen Newport   Total critical care time: 30 minutes  Critical care time was exclusive of separately billable procedures and treating other patients.  Critical care was necessary to treat or prevent imminent or life-threatening deterioration.  Critical care was time spent personally by me on the following activities: development of treatment plan with patient and/or surrogate as well as nursing, discussions with consultants, evaluation of patient's response to treatment, examination of patient, obtaining history from patient or surrogate, ordering and performing treatments and interventions, ordering and review of laboratory  studies, ordering and review of radiographic studies, pulse oximetry and re-evaluation of patient's condition.  ____________________________________________  FINAL ASSESSMENT AND PLAN  Weakness, anemia  Plan: Patient with labs as dictated above. Patient presents to ER with symptomatic anemia and weakness. I have ordered a blood transfusion for the patient. I have discussed with nephrology. She will be admitted, transfused and undergo dialysis.   Earleen Newport, MD   Note: This dictation was prepared with Dragon dictation. Any transcriptional errors that result from this process are unintentional   Earleen Newport, MD 05/28/16 1215

## 2016-05-28 NOTE — Progress Notes (Signed)
Hemodialysis completed. 

## 2016-05-28 NOTE — Progress Notes (Signed)
Pre-hd tx 

## 2016-05-28 NOTE — Progress Notes (Signed)
Blood transfusion completed.

## 2016-05-28 NOTE — Progress Notes (Signed)
Hemodialysis tx start 

## 2016-05-28 NOTE — ED Notes (Signed)
Patient to ER from [facility] for c/o weakness and low Hgb (unsure of level). Patient reports feeling weak x1 week.

## 2016-05-28 NOTE — ED Notes (Signed)
Pt asleep in room. NAD noted.

## 2016-05-28 NOTE — Progress Notes (Signed)
Blood transfusion started.

## 2016-05-28 NOTE — Progress Notes (Signed)
[facility]  ROUNDING NOTE   Subjective:  Patient very well known to [place]. She presents now with severe anemia. We were called by the dialysis center and notified that her hemoglobin was 5.6. She arrived here and we repeated hemoglobin which was low at 5.0. She reports that she was having black tarry stools on Saturday. She is currently seen and evaluated during dialysis and is being administered PRBC transfusion.   Objective:  Vital signs in last 24 hours:  Temp:  [97.7 F (36.5 C)-98 F (36.7 C)] 97.9 F (36.6 C) (07/03 1715) Pulse Rate:  [59-70] 62 (07/03 1730) Resp:  [15-22] 15 (07/03 1730) BP: (95-138)/(49-69) 132/67 mmHg (07/03 1730) SpO2:  [96 %-100 %] 100 % (07/03 1730) Weight:  [59.194 kg (130 lb 8 oz)] 59.194 kg (130 lb 8 oz) (07/03 1630)  Weight change:  Filed Weights   05/28/16 1108 05/28/16 1630  Weight: 59.194 kg (130 lb 8 oz) 59.194 kg (130 lb 8 oz)    Intake/Output:     Intake/Output this shift:     Physical Exam: General: NAD, resting in bed  Head: Normocephalic, atraumatic. Moist oral mucosal membranes  Eyes: Anicteric, PERRL  Neck: Supple, trachea midline  Lungs:  Clear to auscultation  Heart: Regular rate and rhythm  Abdomen:  Soft, nontender,   Extremities: No peripheral edema.  Neurologic: Nonfocal, moving all four extremities  Skin: No lesions  Access: LUE AVF    Basic Metabolic Panel:  Recent Labs Lab 05/28/16 1122  NA 135  K 4.7  CL 97*  CO2 23  GLUCOSE 203*  BUN 80*  CREATININE 11.31*  CALCIUM 7.0*    Liver Function Tests:  Recent Labs Lab 05/28/16 1122  AST 23  ALT 15  ALKPHOS 82  BILITOT 0.4  PROT 5.7*  ALBUMIN 3.2*   No results for input(s): LIPASE, AMYLASE in the last 168 hours. No results for input(s): AMMONIA in the last 168 hours.  CBC:  Recent Labs Lab 05/28/16 1122  WBC 9.8  NEUTROABS 8.0*  HGB 5.0*  HCT 14.9*  MCV 101.9*  PLT 248    Cardiac Enzymes:  Recent Labs Lab  05/28/16 1122  TROPONINI <0.03    BNP: Invalid input(s): POCBNP  CBG: No results for input(s): GLUCAP in the last 168 hours.  Microbiology: Results for orders placed or performed during the hospital encounter of 04/25/16  MRSA PCR Screening     Status: None   Collection Time: 04/25/16  6:30 PM  Result Value Ref Range Status   MRSA by PCR NEGATIVE NEGATIVE Final    Comment:        The GeneXpert MRSA Assay (FDA approved for NASAL specimens only), is one component of a comprehensive MRSA colonization surveillance program. It is not intended to diagnose MRSA infection nor to guide or monitor treatment for MRSA infections.     Coagulation Studies:  Recent Labs  05/28/16 1122  LABPROT 14.4  INR 1.10    Urinalysis: No results for input(s): COLORURINE, LABSPEC, PHURINE, GLUCOSEU, HGBUR, BILIRUBINUR, KETONESUR, PROTEINUR, UROBILINOGEN, NITRITE, LEUKOCYTESUR in the last 72 hours.  Invalid input(s): APPERANCEUR    Imaging: Dg Thoracic Spine 2 View  05/28/2016  CLINICAL DATA:  Weakness. EXAM: THORACIC SPINE 2 VIEWS COMPARISON:  05/02/2016 FINDINGS: The alignment of the thoracic spine appears normal. The vertebral body heights are well preserved. There is no fracture or subluxation identified. Aortic atherosclerosis identified. IMPRESSION: 1. No acute cardiopulmonary abnormalities. Electronically Signed   By: Queen Slough.D.  On: 05/28/2016 14:02     Medications:     . sodium chloride  10 mL/hr Intravenous Once  . ALPRAZolam  0.5 mg Oral BID  . calcium acetate  1,334 mg Oral TID WC  . calcium carbonate  500 mg Oral BID WC  . [START ON 05/29/2016] cinacalcet  60 mg Oral Q breakfast  . [START ON 05/29/2016] ezetimibe  10 mg Oral Daily  . [START ON 05/29/2016] furosemide  40 mg Oral Q T,Th,S,Su  . mometasone-formoterol  2 puff Inhalation BID  . multivitamin  1 tablet Oral QHS  . pantoprazole  40 mg Oral BID  . [START ON 05/29/2016] sertraline  50 mg Oral Daily  . sodium  chloride flush  3 mL Intravenous Q12H   sodium chloride, sodium chloride, acetaminophen **OR** acetaminophen, albuterol, calcium acetate, fluticasone, heparin, lidocaine (PF), lidocaine-prilocaine, lidocaine-prilocaine, ondansetron **OR** ondansetron (ZOFRAN) IV  Assessment/ Plan:  60 y.o. female with pmhx of HTN, hyperlipidemia, diastolic heart failure, hypertension, COPD, tobacco abuse, AOCD, SHPTH, LUE AVF, ESRD 02/2013, admission for severe anemia/black stools 05/28/16.  [facility] [place]/MWF  1. End-stage renal disease on hemodialysis MWF. Patient seen and evaluated during hemodialysis. Patient appears to be tolerating dialysis well. Reevaluate for dialysis tomorrow as well as she may require further blood products.  2. Anemia of chronic kidney disease/GI bleed. Patient having black tarry stools at home. Agree with GI consultation. Continue pantoprazole.  3. Secondary hyperparathyroidism. Continue calcium acetate as well as cinacalcet. Periodically monitor bone marrow metabolism parameters.  4.  Thanks for consultation.    LOS: 0 Samina Weekes 7/3/20175:43 PM

## 2016-05-28 NOTE — H&P (Signed)
[place] [facility] [place] NAME: Michele Nash    MR#:  [id number]  DATE OF BIRTH:  Apr 10, 1956  DATE OF ADMISSION:  05/28/2016  PRIMARY CARE PHYSICIAN: [name], MD   REQUESTING/REFERRING PHYSICIAN:   CHIEF COMPLAINT:   Chief Complaint  Patient presents with  . Weakness    HISTORY OF PRESENT ILLNESS: Michele Nash  is a 60 y.o. female with a known history of End-stage renal disease, COPD, hypertension, hyperlipidemia, depression, who presents to the hospital with complaints of anemia, weakness, no energy. Patient told me that [facility] called her for blood work which was done recently, she was advised to come to emergency room since her hemoglobin level was low. Patient admits of having black stool on Saturday, few days ago, feeling very fatigued and weak ever since this episode of black stool/diarrhea. In emergency room, patient's hemoglobin level was found to be 5.0 and hospitalist services were contacted for admission. The patient admits of having left flank area pains for a few weeks now, she does of losing about 7 pounds over the past 2 weeks, admits of poor oral intake, admits of epigastric abdominal pain, she's been on aspirin therapy for the past 3 weeks.  PAST MEDICAL HISTORY:   Past Medical History  Diagnosis Date  . Chronic kidney disease   . COPD (chronic obstructive pulmonary disease) ([place])   . Hypertension   . Hyperlipidemia   . Depression   . Dialysis patient ([place]) 2014  . Obesity   . Bronchitis   . Headache   . GERD (gastroesophageal reflux disease)   . Arthritis   . Anemia   . Renal dialysis device, implant, or graft complication     RIGHT CHEST CATH  . Apnea, sleep     for sleep study 10/17/15-no cpap yet  . Diabetes mellitus without complication ([place])     no meds    PAST SURGICAL HISTORY: Past Surgical History  Procedure Laterality Date  . Abdominal hysterectomy    . Dialysis fistula  creation    . Colonoscopy  2011  . Peripheral vascular catheterization N/A 05/10/2015    Procedure: A/V Shuntogram/Fistulagram;  Surgeon: Katha Cabal, MD;  Location: [place] [facility];  Service: Cardiovascular;  Laterality: N/A;  . Peripheral vascular catheterization Left 05/10/2015    Procedure: A/V Shunt Intervention;  Surgeon: Katha Cabal, MD;  Location: [place] [facility];  Service: Cardiovascular;  Laterality: Left;  . Peripheral vascular catheterization Left 08/23/2015    Procedure: A/V Shuntogram/Fistulagram;  Surgeon: Katha Cabal, MD;  Location: [place] [facility];  Service: Cardiovascular;  Laterality: Left;  . Peripheral vascular catheterization N/A 08/23/2015    Procedure: A/V Shunt Intervention;  Surgeon: Katha Cabal, MD;  Location: [place] [facility];  Service: Cardiovascular;  Laterality: N/A;  . Peripheral vascular catheterization  08/23/2015    Procedure: Dialysis/Perma Catheter Insertion;  Surgeon: Katha Cabal, MD;  Location: [place] [facility];  Service: Cardiovascular;;  . Colonoscopy with propofol N/A 09/20/2015    Procedure: COLONOSCOPY WITH PROPOFOL;  Surgeon: Lucilla Lame, MD;  Location: [facility];  Service: Endoscopy;  Laterality: N/A;  . Revison of arteriovenous fistula Left 10/28/2015    Procedure: REVISON OF ARTERIOVENOUS FISTULA WITH ARTEGRAFT;  Surgeon: Katha Cabal, MD;  Location: [facility];  Service: Vascular;  Laterality: Left;  . Wound debridement Left 10/28/2015    Procedure: Resection of shoulder cyst ( left );  Surgeon:  Katha Cabal, MD;  Location: [facility];  Service: Vascular;  Laterality: Left;  . Peripheral vascular catheterization N/A 01/03/2016    Procedure: Dialysis/Perma Catheter Removal;  Surgeon: Katha Cabal, MD;  Location: [place] [facility];  Service: Cardiovascular;  Laterality: N/A;    SOCIAL HISTORY:  Social History  Substance Use Topics  . Smoking status: Former Smoker -- 0.50 packs/day  for 40 years    Types: Cigarettes    Quit date: 11/23/2014  . Smokeless tobacco: Never Used  . Alcohol Use: No    FAMILY HISTORY:  Family History  Problem Relation Age of Onset  . Heart disease Mother   . Cancer Father   . Cancer Sister     DRUG ALLERGIES:  Allergies  Allergen Reactions  . Sulfa Antibiotics Swelling, Rash and Other (See Comments)    Reaction:  Facial/body swelling     Review of Systems  Constitutional: Positive for weight loss and malaise/fatigue. Negative for fever and chills.  HENT: Negative for congestion.   Eyes: Negative for blurred vision and double vision.  Respiratory: Positive for cough, sputum production, shortness of breath and wheezing.   Cardiovascular: Positive for chest pain. Negative for palpitations, orthopnea, leg swelling and PND.  Gastrointestinal: Positive for heartburn, abdominal pain, diarrhea and melena. Negative for nausea, vomiting, constipation and blood in stool.  Genitourinary: Negative for dysuria, urgency, frequency and hematuria.  Musculoskeletal: Negative for falls.  Skin: Negative for rash.  Neurological: Positive for weakness. Negative for dizziness.  Psychiatric/Behavioral: Negative for depression and memory loss. The patient is not nervous/anxious.     MEDICATIONS AT HOME:  Prior to Admission medications   Medication Sig Start Date End Date Taking? Authorizing Provider  albuterol (PROVENTIL HFA;VENTOLIN HFA) 108 (90 BASE) MCG/ACT inhaler Inhale 2 puffs into the lungs every 6 (six) hours as needed for wheezing or shortness of breath.   Yes Historical Provider, MD  ALPRAZolam Duanne Moron) 0.5 MG tablet Take 0.5 mg by mouth 2 (two) times daily.   Yes Historical Provider, MD  amLODipine (NORVASC) 10 MG tablet Take 10 mg by mouth at bedtime.   Yes Historical Provider, MD  calcium acetate (PHOSLO) 667 MG capsule Take 667-1,334 mg by mouth See admin instructions. Pt takes two capsules three times daily with meals and one capsule two  times daily with snacks.   Yes Historical Provider, MD  calcium carbonate (OS-CAL) 600 MG TABS tablet Take 600 mg by mouth 2 (two) times daily with a meal.   Yes Historical Provider, MD  carvedilol (COREG) 25 MG tablet Take 1 tablet (25 mg total) by mouth 2 (two) times daily with a meal. 04/26/16  Yes Bettey Costa, MD  cinacalcet (SENSIPAR) 30 MG tablet Take 60 mg by mouth daily with breakfast.    Yes Historical Provider, MD  clopidogrel (PLAVIX) 75 MG tablet Take 1 tablet (75 mg total) by mouth daily. 05/02/16 05/02/17 Yes Earleen Newport, MD  ezetimibe (ZETIA) 10 MG tablet Take 10 mg by mouth daily.    Yes Historical Provider, MD  fluticasone (FLONASE) 50 MCG/ACT nasal spray Place 2 sprays into both nostrils daily as needed for rhinitis.   Yes Historical Provider, MD  Fluticasone-Salmeterol (ADVAIR) 250-50 MCG/DOSE AEPB Inhale 1 puff into the lungs 2 (two) times daily.   Yes Historical Provider, MD  furosemide (LASIX) 40 MG tablet Take 40 mg by mouth every Tuesday, Thursday, Saturday, and Sunday.    Yes Historical Provider, MD  lidocaine-prilocaine (EMLA) cream Apply 1 application topically  as needed (prior to accessing port).   Yes Historical Provider, MD  losartan (COZAAR) 100 MG tablet Take 100 mg by mouth every Tuesday, Thursday, Saturday, and Sunday.    Yes Historical Provider, MD  multivitamin (RENA-VIT) TABS tablet Take 1 tablet by mouth at bedtime.    Yes Historical Provider, MD  omeprazole (PRILOSEC) 20 MG capsule Take 20 mg by mouth daily.    Yes Historical Provider, MD  sertraline (ZOLOFT) 50 MG tablet Take 50 mg by mouth daily.    Yes Historical Provider, MD      PHYSICAL EXAMINATION:   VITAL SIGNS: Blood pressure 108/68, pulse 63, temperature 97.9 F (36.6 C), temperature source Oral, resp. rate 21, height 5\' 5"  (1.651 m), weight 59.194 kg (130 lb 8 oz), SpO2 100 %.  GENERAL:  60 y.o.-year-old patient lying in the bed with no acute distress. Pale, but comfortable on the stretcher,  not in significant distress  EYES: Pupils equal, round, reactive to light and accommodation. No scleral icterus. Extraocular muscles intact.  HEENT: Head atraumatic, normocephalic. Oropharynx and nasopharynx clear.  NECK:  Supple, no jugular venous distention. No thyroid enlargement, no tenderness.  LUNGS: Diminishedeath sounds bilaterally, no wheezing, rales,rhonchi or crepitation. No use of accessory muscles of respiration.  CARDIOVASCULAR: S1, S2 normal. No murmurs, rubs, or gallops.  ABDOMEN: Soft, nontender, nondistended. Bowel sounds present. No organomegaly or mass.  EXTREMITIES: No pedal edema, cyanosis, or clubbing.  Left upper extremity AV fistula with good bruit and thrill  NEUROLOGIC: Cranial nerves II through XII are intact. Muscle strength 5/5 in all extremities. Sensation intact. Gait not checked.  PSYCHIATRIC: The patient is alert and oriented x 3.  SKIN: No obvious rash, lesion, or ulcer.  . Thoracic spine palpation elicits significant pain in the lower part of the thorax. No CVA tenderness bilaterally on percussion LABORATORY PANEL:   CBC  Recent Labs Lab 05/28/16 1122  WBC 9.8  HGB 5.0*  HCT 14.9*  PLT 248  MCV 101.9*  MCH 33.8  MCHC 33.2  RDW 14.1  LYMPHSABS 0.9*  MONOABS 0.6  EOSABS 0.2  BASOSABS 0.1   ------------------------------------------------------------------------------------------------------------------  Chemistries   Recent Labs Lab 05/28/16 1122  NA 135  K 4.7  CL 97*  CO2 23  GLUCOSE 203*  BUN 80*  CREATININE 11.31*  CALCIUM 7.0*  AST 23  ALT 15  ALKPHOS 82  BILITOT 0.4   ------------------------------------------------------------------------------------------------------------------  Cardiac Enzymes  Recent Labs Lab 05/28/16 1122  TROPONINI <0.03   ------------------------------------------------------------------------------------------------------------------  RADIOLOGY: No results found.  EKG: Orders placed or  performed during the hospital encounter of 05/28/16  . ED EKG  . ED EKG  . EKG 12-Lead  . EKG 12-Lead  EKG in emergency room showed sinus rhythm at 62 bpm, old anterior infarct with QS in V1 through V2, T depressions in V1, V2, and high lateral lead. No acute ST-T changes noted  CT of abdomen and pelvis with oral contrast 29th of June 2017 revealed bilateral hyperdense renal lesions, stable from before, difficult to characterize due to absence of IV contrast. Otherwise unremarkable study   IMPRESSION AND PLAN:  Principal Problem:   Symptomatic anemia Active Problems:   Hypotension   End stage renal disease (HCC)   Hyperglycemia #1. Symptomatic anemia, likely gastrointestinal bleed due to history of dark/black stool a few days ago. Get Hemoccult. Transfuse patient with packed red blood cells, follow hemoglobin level after transfusion . Getting gastroenterology consultation for possible EGD. Initiate patient on Protonix intravenously twice a  day.Discontinue Plavix and aspirin/nonsteroidal anti-inflammatory medications.   #2 hypotension, hold blood pressure medications #3. End-stage renal disease, nephrology consultation will be obtained for dialysis planning #4. Hyperglycemia, get hemoglobin A1c to rule out diabetes mellitus #5. Back pain, get x-ray of thoracic spine to rule out vertebral compression fracture.   All the records are reviewed and case discussed with ED provider. Management plans discussed with the patient, family and they are in agreement.  CODE STATUS: Code Status History    Date Active Date Inactive Code Status Order ID Comments User Context   04/25/2016  6:04 PM 04/26/2016  9:17 PM Full Code [id number]  Nicholes Mango, MD Inpatient   10/28/2015  6:50 PM 10/30/2015  6:09 PM Full Code [id number]  Theodoro Grist, MD Inpatient   03/30/2015 12:51 AM 03/31/2015  4:44 PM Full Code [id number]  Juluis Mire, MD Inpatient       TOTAL TIME TAKING CARE OF THIS PATIENT 50 minutes.     Theodoro Grist M.D on 05/28/2016 at 12:39 PM  Between 7am to 6pm - Pager - [phone] After 6pm go to [email or web address] - password [email or web address] [facility]  Office  [phone]  CC: Primary care physician; [name], MD

## 2016-05-29 DIAGNOSIS — E43 Unspecified severe protein-calorie malnutrition: Secondary | ICD-10-CM | POA: Insufficient documentation

## 2016-05-29 LAB — BASIC METABOLIC PANEL
ANION GAP: 6 (ref 5–15)
BUN: 27 mg/dL — ABNORMAL HIGH (ref 6–20)
CHLORIDE: 99 mmol/L — AB (ref 101–111)
CO2: 34 mmol/L — ABNORMAL HIGH (ref 22–32)
Calcium: 7.6 mg/dL — ABNORMAL LOW (ref 8.9–10.3)
Creatinine, Ser: 6.27 mg/dL — ABNORMAL HIGH (ref 0.44–1.00)
GFR calc Af Amer: 7 mL/min — ABNORMAL LOW (ref >60)
GFR, EST NON AFRICAN AMERICAN: 6 mL/min — AB (ref >60)
GLUCOSE: 100 mg/dL — AB (ref 65–99)
POTASSIUM: 3.6 mmol/L (ref 3.5–5.1)
Sodium: 139 mmol/L (ref 135–145)

## 2016-05-29 LAB — PARATHYROID HORMONE, INTACT (NO CA): PTH: 534 pg/mL — AB (ref 15–65)

## 2016-05-29 LAB — MISC LABCORP TEST (SEND OUT): Labcorp test code: 1453

## 2016-05-29 LAB — CBC
HEMATOCRIT: 16.6 % — AB (ref 35.0–47.0)
HEMOGLOBIN: 5.8 g/dL — AB (ref 12.0–16.0)
MCH: 32.7 pg (ref 26.0–34.0)
MCHC: 35 g/dL (ref 32.0–36.0)
MCV: 93.3 fL (ref 80.0–100.0)
Platelets: 250 10*3/uL (ref 150–440)
RBC: 1.78 MIL/uL — ABNORMAL LOW (ref 3.80–5.20)
RDW: 20.5 % — ABNORMAL HIGH (ref 11.5–14.5)
WBC: 8.9 10*3/uL (ref 3.6–11.0)

## 2016-05-29 LAB — HEPATITIS B SURFACE ANTIGEN: Hepatitis B Surface Ag: NEGATIVE

## 2016-05-29 LAB — HEMOGLOBIN AND HEMATOCRIT, BLOOD
HCT: 24.9 % — ABNORMAL LOW (ref 35.0–47.0)
HEMOGLOBIN: 8.8 g/dL — AB (ref 12.0–16.0)

## 2016-05-29 LAB — PHOSPHORUS: PHOSPHORUS: 4 mg/dL (ref 2.5–4.6)

## 2016-05-29 LAB — PREPARE RBC (CROSSMATCH)

## 2016-05-29 LAB — HEPATITIS B SURFACE ANTIBODY,QUALITATIVE: HEP B S AB: REACTIVE

## 2016-05-29 MED ORDER — SODIUM CHLORIDE 0.9 % IV SOLN: Freq: Once | INTRAVENOUS | Status: DC

## 2016-05-29 MED ORDER — CARVEDILOL 12.5 MG PO TABS
25.0000 mg | ORAL_TABLET | Freq: Two times a day (BID) | ORAL | Status: DC
  Administered 2016-05-29 – 2016-05-30 (×2): 25 mg via ORAL
  Filled 2016-05-29 (×2): qty 2

## 2016-05-29 NOTE — Progress Notes (Signed)
Dialysis started 

## 2016-05-29 NOTE — Progress Notes (Signed)
Tx complete  

## 2016-05-29 NOTE — Progress Notes (Signed)
Patient ID: Michele Nash, female   DOB: 1956-10-09, 60 y.o.   MRN: [id number] [facility] PROGRESS NOTE  Michele Nash [id number] DOB: May 27, 1956 DOA: 05/28/2016 PCP: [name], MD  HPI/Subjective: Patient feeling better today. She was short of breath yesterday. Complains of ear pain going on for months. Ever since she had a CT scan she's been having diarrhea. It was burgundy then dark.  Objective: Filed Vitals:   05/29/16 1300 05/29/16 1315  BP: 162/72 146/76  Pulse: 60 64  Temp:    Resp: 15 18    Filed Weights   05/28/16 1630 05/28/16 1950 05/29/16 1033  Weight: 59.194 kg (130 lb 8 oz) 57 kg (125 lb 10.6 oz) 55.6 kg (122 lb 9.2 oz)    ROS: Review of Systems  Constitutional: Negative for fever and chills.  Eyes: Negative for blurred vision.  Respiratory: Positive for shortness of breath. Negative for cough.   Cardiovascular: Negative for chest pain.  Gastrointestinal: Positive for diarrhea and melena. Negative for nausea, vomiting, abdominal pain and constipation.  Genitourinary: Negative for dysuria.  Musculoskeletal: Negative for joint pain.  Neurological: Negative for dizziness and headaches.   Exam: Physical Exam  HENT:  Nose: No mucosal edema.  Mouth/Throat: No oropharyngeal exudate or posterior oropharyngeal edema.  Eyes: EOM and lids are normal. Pupils are equal, round, and reactive to light.  Conjunctiva pale  Neck: No JVD present. Carotid bruit is not present. No edema present. No thyroid mass and no thyromegaly present.  Cardiovascular: S1 normal and S2 normal.  Exam reveals no gallop.   Murmur heard.  Systolic murmur is present with a grade of 4/6  Pulses:      Dorsalis pedis pulses are 2+ on the right side, and 2+ on the left side.  Respiratory: No respiratory distress. She has no wheezes. She has no rhonchi. She has no rales.  GI: Soft. Bowel sounds are normal. There is no tenderness.  Musculoskeletal:       Right ankle: She exhibits no  swelling.       Left ankle: She exhibits no swelling.  Lymphadenopathy:    She has no cervical adenopathy.  Neurological: She is alert. No cranial nerve deficit.  Skin: Skin is warm. No rash noted. Nails show no clubbing.  Psychiatric: She has a normal mood and affect.      Data Reviewed: Basic Metabolic Panel:  Recent Labs Lab 05/28/16 1122 05/28/16 1658 05/29/16 0445  NA 135  --  139  K 4.7  --  3.6  CL 97*  --  99*  CO2 23  --  34*  GLUCOSE 203*  --  100*  BUN 80*  --  27*  CREATININE 11.31*  --  6.27*  CALCIUM 7.0*  --  7.6*  PHOS  --  6.4* 4.0   Liver Function Tests:  Recent Labs Lab 05/28/16 1122  AST 23  ALT 15  ALKPHOS 82  BILITOT 0.4  PROT 5.7*  ALBUMIN 3.2*   CBC:  Recent Labs Lab 05/28/16 1122 05/28/16 1800 05/29/16 0445  WBC 9.8  --  8.9  NEUTROABS 8.0*  --   --   HGB 5.0* 5.9* 5.8*  HCT 14.9* 17.3* 16.6*  MCV 101.9*  --  93.3  PLT 248  --  250   Cardiac Enzymes:  Recent Labs Lab 05/28/16 1122  TROPONINI <0.03     Studies: Dg Thoracic Spine 2 View  05/28/2016  CLINICAL DATA:  Weakness. EXAM: THORACIC SPINE 2 VIEWS  COMPARISON:  05/02/2016 FINDINGS: The alignment of the thoracic spine appears normal. The vertebral body heights are well preserved. There is no fracture or subluxation identified. Aortic atherosclerosis identified. IMPRESSION: 1. No acute cardiopulmonary abnormalities. Electronically Signed   By: Kerby Moors M.D.   On: 05/28/2016 14:02    Scheduled Meds: . sodium chloride  10 mL/hr Intravenous Once  . sodium chloride   Intravenous Once  . ALPRAZolam  0.5 mg Oral BID  . calcium acetate  1,334 mg Oral TID WC  . calcium carbonate  500 mg Oral BID WC  . cinacalcet  60 mg Oral Q breakfast  . ezetimibe  10 mg Oral Daily  . furosemide  40 mg Oral Q T,Th,S,Su  . mometasone-formoterol  2 puff Inhalation BID  . multivitamin  1 tablet Oral QHS  . pantoprazole  40 mg Oral BID  . sertraline  50 mg Oral Daily  . sodium  chloride flush  3 mL Intravenous Q12H    Assessment/Plan:  1. Symptomatic blood loss anemia. Patient was given 1 unit of packed red blood cells yesterday. Hemoglobin still very low at 5.8. I'll give another 2 units of packed red blood cells with dialysis today. 2. GI bleed. Plavix on hold. As per gastrointestinal note they would like Plavix on hold for 5-7 days prior to any procedure. Potentially these procedures can be done as outpatient. 3. End-stage renal disease on dialysis. Dialysis today because of transfusion. 4. Hypotension. Blood pressure now elevated. Likely can go back on Coreg this evening. 5. Back pain thoracic spine x-ray negative 6. History of COPD. Currently on oxygen  Code Status:     Code Status Orders        Start     Ordered   05/28/16 1455  Full code   Continuous     05/28/16 1454    Code Status History    Date Active Date Inactive Code Status Order ID Comments User Context   04/25/2016  6:04 PM 04/26/2016  9:17 PM Full Code [id number]  Nicholes Mango, MD Inpatient   10/28/2015  6:50 PM 10/30/2015  6:09 PM Full Code [id number]  Theodoro Grist, MD Inpatient   03/30/2015 12:51 AM 03/31/2015  4:44 PM Full Code [id number]  Juluis Mire, MD Inpatient    Advance Directive Documentation        Most Recent Value   Type of Advance Directive  Living will   Pre-existing out of facility DNR order (yellow form or pink MOST form)     "MOST" Form in Place?       Disposition Plan: Potentially home next few days depending on how she does  Consultants:  Nephrology  Gastroenterology  Time spent: 25 minutes  [place], [place]

## 2016-05-29 NOTE — Progress Notes (Signed)
Pre Dialysis 

## 2016-05-29 NOTE — Progress Notes (Signed)
Dr. Marcille Blanco notified of hgb post transfusion/dialysis. Acknowledged. No new orders. Barbaraann Faster, RN 1:45 AM 05/29/2016

## 2016-05-29 NOTE — Consult Note (Signed)
Subjective: Patient seen for symptomatic anemia in the setting of recent black stool. No bowel movement since admission. She is feeling better today. She denies any nausea or vomiting or abdominal pain.  Objective: Vital signs in last 24 hours: Temp:  [97.7 F (36.5 C)-98.8 F (37.1 C)] 98.5 F (36.9 C) (07/04 1145) Pulse Rate:  [59-70] 60 (07/04 1145) Resp:  [13-27] 18 (07/04 1145) BP: (95-140)/(49-91) 137/72 mmHg (07/04 1145) SpO2:  [79 %-100 %] 100 % (07/04 1145) Weight:  [55.6 kg (122 lb 9.2 oz)-59.194 kg (130 lb 8 oz)] 55.6 kg (122 lb 9.2 oz) (07/04 1033) Blood pressure 137/72, pulse 60, temperature 98.5 F (36.9 C), temperature source Oral, resp. rate 18, height 5\' 5"  (1.651 m), weight 55.6 kg (122 lb 9.2 oz), SpO2 100 %.   Intake/Output from previous day: 07/03 0701 - 07/04 0700 In: 713 [P.O.:360; I.V.:53; Blood:300] Out: 2000   Intake/Output this shift: Total I/O In: 281 [Blood:281] Out: 0    General appearance:  60 year old female no distress Resp:  Bilaterally clear to auscultation Cardio:  Regular rate and rhythm GI:  Soft nontender nondistended bowel sounds positive normoactive Extremities:     Lab Results: Results for orders placed or performed during the hospital encounter of 05/28/16 (from the past 24 hour(s))  Miscellaneous [facility] test (send-out)     Status: None   Collection Time: 05/28/16  1:00 PM  Result Value Ref Range   [facility] test code 1453    [facility] test name  HEMOGLOBIN (HB)A1C    Misc [facility] result COMMENT   Parathyroid hormone, intact (no Ca)     Status: Abnormal   Collection Time: 05/28/16  4:58 PM  Result Value Ref Range   PTH 534 (H) 15 - 65 pg/mL  Hepatitis B surface antigen     Status: None   Collection Time: 05/28/16  4:58 PM  Result Value Ref Range   Hepatitis B Surface Ag Negative Negative  Hepatitis B surface antibody     Status: None   Collection Time: 05/28/16  4:58 PM  Result Value Ref Range   Hep B S Ab Reactive    Phosphorus     Status: Abnormal   Collection Time: 05/28/16  4:58 PM  Result Value Ref Range   Phosphorus 6.4 (H) 2.5 - 4.6 mg/dL  Hemoglobin and hematocrit, blood     Status: Abnormal   Collection Time: 05/28/16  6:00 PM  Result Value Ref Range   Hemoglobin 5.9 (L) 12.0 - 16.0 g/dL   HCT 17.3 (L) 35.0 - [id number] %  Basic metabolic panel     Status: Abnormal   Collection Time: 05/29/16  4:45 AM  Result Value Ref Range   Sodium 139 135 - 145 mmol/L   Potassium 3.6 3.5 - 5.1 mmol/L   Chloride 99 (L) 101 - 111 mmol/L   CO2 34 (H) 22 - 32 mmol/L   Glucose, Bld 100 (H) 65 - 99 mg/dL   BUN 27 (H) 6 - 20 mg/dL   Creatinine, Ser 6.27 (H) 0.44 - 1.00 mg/dL   Calcium 7.6 (L) 8.9 - 10.3 mg/dL   GFR calc non Af Amer 6 (L) >60 mL/min   GFR calc Af Amer 7 (L) >60 mL/min   Anion gap 6 5 - 15  CBC     Status: Abnormal   Collection Time: 05/29/16  4:45 AM  Result Value Ref Range   WBC 8.9 3.6 - 11.0 K/uL   RBC 1.78 (L) 3.80 - 5.20 MIL/uL  Hemoglobin 5.8 (L) 12.0 - 16.0 g/dL   HCT 16.6 (L) 35.0 - 47.0 %   MCV 93.3 80.0 - 100.0 fL   MCH 32.7 26.0 - 34.0 pg   MCHC 35.0 32.0 - 36.0 g/dL   RDW 20.5 (H) 11.5 - 14.5 %   Platelets 250 150 - 440 K/uL  Phosphorus     Status: None   Collection Time: 05/29/16  4:45 AM  Result Value Ref Range   Phosphorus 4.0 2.5 - 4.6 mg/dL  Prepare RBC     Status: None   Collection Time: 05/29/16  7:16 AM  Result Value Ref Range   Order Confirmation ORDER PROCESSED BY BLOOD BANK       Recent Labs  05/28/16 1122 05/28/16 1800 05/29/16 0445  WBC 9.8  --  8.9  HGB 5.0* 5.9* 5.8*  HCT 14.9* 17.3* 16.6*  PLT 248  --  250   BMET  Recent Labs  05/28/16 1122 05/29/16 0445  NA 135 139  K 4.7 3.6  CL 97* 99*  CO2 23 34*  GLUCOSE 203* 100*  BUN 80* 27*  CREATININE 11.31* 6.27*  CALCIUM 7.0* 7.6*   LFT  Recent Labs  05/28/16 1122  PROT 5.7*  ALBUMIN 3.2*  AST 23  ALT 15  ALKPHOS 82  BILITOT 0.4   PT/INR  Recent Labs  05/28/16 1122   LABPROT 14.4  INR 1.10   Hepatitis Panel  Recent Labs  05/28/16 1658  HEPBSAG Negative   C-Diff No results for input(s): CDIFFTOX in the last 72 hours. No results for input(s): CDIFFPCR in the last 72 hours.   Studies/Results: Dg Thoracic Spine 2 View  05/28/2016  CLINICAL DATA:  Weakness. EXAM: THORACIC SPINE 2 VIEWS COMPARISON:  05/02/2016 FINDINGS: The alignment of the thoracic spine appears normal. The vertebral body heights are well preserved. There is no fracture or subluxation identified. Aortic atherosclerosis identified. IMPRESSION: 1. No acute cardiopulmonary abnormalities. Electronically Signed   By: Kerby Moors M.D.   On: 05/28/2016 14:02    Scheduled Inpatient Medications:   . sodium chloride  10 mL/hr Intravenous Once  . sodium chloride   Intravenous Once  . ALPRAZolam  0.5 mg Oral BID  . calcium acetate  1,334 mg Oral TID WC  . calcium carbonate  500 mg Oral BID WC  . cinacalcet  60 mg Oral Q breakfast  . ezetimibe  10 mg Oral Daily  . furosemide  40 mg Oral Q T,Th,S,Su  . mometasone-formoterol  2 puff Inhalation BID  . multivitamin  1 tablet Oral QHS  . pantoprazole  40 mg Oral BID  . sertraline  50 mg Oral Daily  . sodium chloride flush  3 mL Intravenous Q12H    Continuous Inpatient Infusions:     PRN Inpatient Medications:  acetaminophen **OR** acetaminophen, albuterol, calcium acetate, fluticasone, lidocaine-prilocaine, ondansetron **OR** ondansetron (ZOFRAN) IV  Miscellaneous:   Assessment:  1. Symptomatic anemia recent report of lack stools occurring while taking Plavix. She is apparently not taken Plavix admission but is uncertain of when her last dose actually was. Pharmacy records indicate recent filling of the prescription with a year's refill.  Plan:  1. Patient will need to be off the Plavix for 5-7 days from day of admission before procedure. Serial hemoglobins, daily. Transfuse as needed. Continue current twice a day PPI.  Following  Lollie Sails MD 05/29/2016, 11:57 AM

## 2016-05-29 NOTE — Progress Notes (Signed)
Notified Dr. Leslye Peer that pt had a blood clot in her urine. Urine appeared clear but there was a clot. Pt voiced that it burnt when she urinated. No new orders received.

## 2016-05-29 NOTE — Progress Notes (Signed)
Dr. Teressa Lower notified of hgb 5.8 this am; No active bleeding overnight; acknowledged. No new orders. Barbaraann Faster, RN 6:44 AM 05/29/2016

## 2016-05-29 NOTE — Progress Notes (Signed)
[facility]  ROUNDING NOTE   Subjective:  Patient seen and evaluated during HD.   She is currently receiving blood transfusion. She does report feeling somewhat better.   Objective:  Vital signs in last 24 hours:  Temp:  [97.7 F (36.5 C)-98.8 F (37.1 C)] 98.5 F (36.9 C) (07/04 1200) Pulse Rate:  [59-70] 59 (07/04 1200) Resp:  [13-27] 16 (07/04 1200) BP: (95-162)/(49-91) 162/69 mmHg (07/04 1200) SpO2:  [79 %-100 %] 100 % (07/04 1200) Weight:  [55.6 kg (122 lb 9.2 oz)-59.194 kg (130 lb 8 oz)] 55.6 kg (122 lb 9.2 oz) (07/04 1033)  Weight change:  Filed Weights   05/28/16 1630 05/28/16 1950 05/29/16 1033  Weight: 59.194 kg (130 lb 8 oz) 57 kg (125 lb 10.6 oz) 55.6 kg (122 lb 9.2 oz)    Intake/Output: I/O last 3 completed shifts: In: 39 [P.O.:360; I.V.:53; Blood:300] Out: 2000 [Other:2000]   Intake/Output this shift:  Total I/O In: 281 [Blood:281] Out: 0   Physical Exam: General: NAD, resting in bed  Head: Normocephalic, atraumatic. Moist oral mucosal membranes  Eyes: Anicteric  Neck: Supple, trachea midline  Lungs:  Clear to auscultation, normal effort  Heart: S1S2 no rubs  Abdomen:  Soft, nontender, BS present   Extremities: No peripheral edema.  Neurologic: Nonfocal, moving all four extremities  Skin: No lesions  Access: LUE AVF    Basic Metabolic Panel:  Recent Labs Lab 05/28/16 1122 05/28/16 1658 05/29/16 0445  NA 135  --  139  K 4.7  --  3.6  CL 97*  --  99*  CO2 23  --  34*  GLUCOSE 203*  --  100*  BUN 80*  --  27*  CREATININE 11.31*  --  6.27*  CALCIUM 7.0*  --  7.6*  PHOS  --  6.4* 4.0    Liver Function Tests:  Recent Labs Lab 05/28/16 1122  AST 23  ALT 15  ALKPHOS 82  BILITOT 0.4  PROT 5.7*  ALBUMIN 3.2*   No results for input(s): LIPASE, AMYLASE in the last 168 hours. No results for input(s): AMMONIA in the last 168 hours.  CBC:  Recent Labs Lab 05/28/16 1122 05/28/16 1800 05/29/16 0445  WBC 9.8  --  8.9   NEUTROABS 8.0*  --   --   HGB 5.0* 5.9* 5.8*  HCT 14.9* 17.3* 16.6*  MCV 101.9*  --  93.3  PLT 248  --  250    Cardiac Enzymes:  Recent Labs Lab 05/28/16 1122  TROPONINI <0.03    BNP: Invalid input(s): POCBNP  CBG: No results for input(s): GLUCAP in the last 168 hours.  Microbiology: Results for orders placed or performed during the hospital encounter of 04/25/16  MRSA PCR Screening     Status: None   Collection Time: 04/25/16  6:30 PM  Result Value Ref Range Status   MRSA by PCR NEGATIVE NEGATIVE Final    Comment:        The GeneXpert MRSA Assay (FDA approved for NASAL specimens only), is one component of a comprehensive MRSA colonization surveillance program. It is not intended to diagnose MRSA infection nor to guide or monitor treatment for MRSA infections.     Coagulation Studies:  Recent Labs  05/28/16 1122  LABPROT 14.4  INR 1.10    Urinalysis: No results for input(s): COLORURINE, LABSPEC, PHURINE, GLUCOSEU, HGBUR, BILIRUBINUR, KETONESUR, PROTEINUR, UROBILINOGEN, NITRITE, LEUKOCYTESUR in the last 72 hours.  Invalid input(s): APPERANCEUR    Imaging: Dg Thoracic Spine  2 View  05/28/2016  CLINICAL DATA:  Weakness. EXAM: THORACIC SPINE 2 VIEWS COMPARISON:  05/02/2016 FINDINGS: The alignment of the thoracic spine appears normal. The vertebral body heights are well preserved. There is no fracture or subluxation identified. Aortic atherosclerosis identified. IMPRESSION: 1. No acute cardiopulmonary abnormalities. Electronically Signed   By: Kerby Moors M.D.   On: 05/28/2016 14:02     Medications:     . sodium chloride  10 mL/hr Intravenous Once  . sodium chloride   Intravenous Once  . ALPRAZolam  0.5 mg Oral BID  . calcium acetate  1,334 mg Oral TID WC  . calcium carbonate  500 mg Oral BID WC  . cinacalcet  60 mg Oral Q breakfast  . ezetimibe  10 mg Oral Daily  . furosemide  40 mg Oral Q T,Th,S,Su  . mometasone-formoterol  2 puff Inhalation  BID  . multivitamin  1 tablet Oral QHS  . pantoprazole  40 mg Oral BID  . sertraline  50 mg Oral Daily  . sodium chloride flush  3 mL Intravenous Q12H   acetaminophen **OR** acetaminophen, albuterol, calcium acetate, fluticasone, lidocaine-prilocaine, ondansetron **OR** ondansetron (ZOFRAN) IV  Assessment/ Plan:  60 y.o. female with pmhx of HTN, hyperlipidemia, diastolic heart failure, hypertension, COPD, tobacco abuse, AOCD, SHPTH, LUE AVF, ESRD 02/2013, admission for severe anemia/black stools 05/28/16.  [facility]/[place]/MWF  1. End-stage renal disease on hemodialysis MWF. Pt was to receive additional blood products today so we decided to proceed with HD again.  She is seen and evaluated during HD and tolerating well.  We will plan for HD again tomorrow as well.   2. Anemia of chronic kidney disease/GI bleed. Patient having black tarry stools at home.  - hgb currently 5.8 and still quite low, pt receiving additional blood products during dialysis.   3. Secondary hyperparathyroidism. Phos down to 4.0, continue calcium acetate and cinacalcet.      LOS: 1 Katelin Kutsch 7/4/201712:05 PM

## 2016-05-29 NOTE — Care Management (Signed)
Patient instructed by her dialysis clinic to proceed to ED due low hemoglobin of 5.0.  Experiencing weakness and dark stool.  GI consult in progress and EGD is needed.  Patient has to be off her Plavix for 5 days prior to procedure. Patient has not had Plavix administered since admission.  She does not know if she took one at home on the day of admission.  ESRD- M W F.  Dialysis coordinator informed of admission.  Presents from home and lives with her husband.  Independent in all adls, denies issues accessing medical care, obtaining medications or with transportation.  Current with her PCP.

## 2016-05-29 NOTE — Progress Notes (Signed)
Post dialysis 

## 2016-05-29 NOTE — Progress Notes (Signed)
Okay to give Lasix per Dr. Leslye Peer as this is not her dialysis day and she received dialysis. Also place order for hemoglobin and hematocrit 2 hours from now.

## 2016-05-29 NOTE — Progress Notes (Signed)
Initial Nutrition Assessment  DOCUMENTATION CODES:   Severe malnutrition in context of acute illness/injury  INTERVENTION:   Await diet progression as medically able Recommend Pro-stat on follow-up for added protein (100kcals, 15g protein)   NUTRITION DIAGNOSIS:   Malnutrition related to acute illness as evidenced by energy intake < or equal to 50% for > or equal to 5 days, mild depletion of muscle mass, percent weight loss.  GOAL:   Patient will meet greater than or equal to 90% of their needs  MONITOR:   PO intake, Diet advancement, Labs, Weight trends, I & O's  REASON FOR ASSESSMENT:   Malnutrition Screening Tool    ASSESSMENT:   Pt admitted with low hemoglobin and black tarry stools. GI consulted.  Past Medical History  Diagnosis Date  . Chronic kidney disease   . COPD (chronic obstructive pulmonary disease) ([place])   . Hypertension   . Hyperlipidemia   . Depression   . Dialysis patient ([place]) 2014  . Obesity   . Bronchitis   . Headache   . GERD (gastroesophageal reflux disease)   . Arthritis   . Anemia   . Renal dialysis device, implant, or graft complication     RIGHT CHEST CATH  . Apnea, sleep     for sleep study 10/17/15-no cpap yet  . Diabetes mellitus without complication ([place])     no meds    Diet Order:  Diet clear liquid Room service appropriate?: Yes; Fluid consistency:: Thin   Pt reports drinking broth, grape juice and jello on visit this am, tolerating well so far.  Pt reports poor po intake started on this past Thursday when she had drink a 'shake' for her CAT scan.  Pt reports this gave her bad diarrhea and that she then started to have a poor appetite which continued over the weekend. Pt reports she would try to eat but it felt as if she could not get anything down and therefore did not eat much at all. Pt admitted yesterday and has been on CL.  Pt reports at Dialysis the dietitian gives her a 'small cup of protein' that is grape flavored.   Pt reports not liking nepro.    Medications: Sensipar, Phoslo, Rena-vit, protonix Labs: BUN 27, Cre 6.27, Phosphorus WDL this am, Glucose 100   Gastrointestinal Profile: Last BM: no BM documented  Black tarry stools per pt PTA HD Output: net UF 2L yesterday  Nutrition-Focused Physical Exam Findings: Nutrition-Focused physical exam completed. Findings are no fat depletion, mild-moderate muscle depletion of lower extremitites, and no edema.  Pt reports weakness of lower extremities to Probation officer. Pt reports needing help to get up her stairs at her apartment complex.   Weight Change: Pt reports weight has been fluctuating but has decreased from her usual. Pt reports usual dry weight of 145lbs. Per [facility] weight trends, pt with 9% weight loss in one month.   Skin:  Reviewed, no issues   Height:   Ht Readings from Last 1 Encounters:  05/28/16 5\' 5"  (1.651 m)    Weight:   Wt Readings from Last 1 Encounters:  05/29/16 122 lb 9.2 oz (55.6 kg)   Wt Readings from Last 10 Encounters:  05/29/16 122 lb 9.2 oz (55.6 kg)  05/02/16 134 lb (60.782 kg)  04/25/16 134 lb 8 oz (61.009 kg)  01/06/16 140 lb (63.504 kg)  01/03/16 145 lb (65.772 kg)  10/30/15 146 lb (66.225 kg)  10/10/15 146 lb (66.225 kg)  09/20/15 145 lb (65.772 kg)  08/27/15 147 lb (66.679 kg)  08/25/15 147 lb (66.679 kg)    BMI:  Body mass index is 20.4 kg/(m^2).  Estimated Nutritional Needs:   Kcal:  1710-1995kcals  Protein:  68-86g protein  Fluid:  UOP+1L  EDUCATION NEEDS:   Education needs no appropriate at this time  Dwyane Luo, RD, LDN Pager [phone] Weekend/On-Call Pager [phone]

## 2016-05-30 LAB — CBC
HEMATOCRIT: 24.3 % — AB (ref 35.0–47.0)
HEMOGLOBIN: 8.4 g/dL — AB (ref 12.0–16.0)
MCH: 31.7 pg (ref 26.0–34.0)
MCHC: 34.4 g/dL (ref 32.0–36.0)
MCV: 92.2 fL (ref 80.0–100.0)
Platelets: 264 10*3/uL (ref 150–440)
RBC: 2.63 MIL/uL — AB (ref 3.80–5.20)
RDW: 19.3 % — ABNORMAL HIGH (ref 11.5–14.5)
WBC: 10.7 10*3/uL (ref 3.6–11.0)

## 2016-05-30 LAB — TYPE AND SCREEN
ABO/RH(D): O POS
Antibody Screen: NEGATIVE
UNIT DIVISION: 0
Unit division: 0
Unit division: 0

## 2016-05-30 LAB — BASIC METABOLIC PANEL
Anion gap: 6 (ref 5–15)
BUN: 20 mg/dL (ref 6–20)
CHLORIDE: 99 mmol/L — AB (ref 101–111)
CO2: 34 mmol/L — AB (ref 22–32)
CREATININE: 4.5 mg/dL — AB (ref 0.44–1.00)
Calcium: 7.6 mg/dL — ABNORMAL LOW (ref 8.9–10.3)
GFR calc Af Amer: 11 mL/min — ABNORMAL LOW (ref >60)
GFR calc non Af Amer: 10 mL/min — ABNORMAL LOW (ref >60)
Glucose, Bld: 101 mg/dL — ABNORMAL HIGH (ref 65–99)
POTASSIUM: 3.6 mmol/L (ref 3.5–5.1)
SODIUM: 139 mmol/L (ref 135–145)

## 2016-05-30 LAB — PHOSPHORUS: PHOSPHORUS: 4.7 mg/dL — AB (ref 2.5–4.6)

## 2016-05-30 LAB — MRSA PCR SCREENING: MRSA by PCR: NEGATIVE

## 2016-05-30 MED ORDER — PRO-STAT SUGAR FREE PO LIQD
30.0000 mL | Freq: Two times a day (BID) | ORAL | Status: DC
  Administered 2016-05-31 – 2016-06-01 (×4): 30 mL via ORAL

## 2016-05-30 NOTE — Consult Note (Signed)
Subjective: Patient seen for  Anemia and heme positive stool/melena.  No further black stool.  No bm recorded. No n/v or abdominal pain.   Objective: Vital signs in last 24 hours: Temp:  [97.5 F (36.4 C)-99.4 F (37.4 C)] 97.5 F (36.4 C) (07/05 1700) Pulse Rate:  [58-71] 62 (07/05 1709) Resp:  [16-22] 20 (07/05 1709) BP: (108-177)/(38-110) 122/82 mmHg (07/05 1709) SpO2:  [98 %-100 %] 100 % (07/05 1709) Weight:  [56.4 kg (124 lb 5.4 oz)] 56.4 kg (124 lb 5.4 oz) (07/05 1345) Blood pressure 122/82, pulse 62, temperature 97.5 F (36.4 C), temperature source Oral, resp. rate 20, height 5\' 5"  (1.651 m), weight 56.4 kg (124 lb 5.4 oz), SpO2 100 %.   Intake/Output from previous day: 07/04 0701 - 07/05 0700 In: 1361 [P.O.:840; Blood:521] Out: 1920   Intake/Output this shift: Total I/O In: 750 [P.O.:750] Out: -    General appearance: 60 f no distress. Just returned from dialysis.  Resp:  bcta Cardio:  rrr GI:  Soft nt/nd/bs positive.  Extremities:  No cce.    Lab Results: Results for orders placed or performed during the hospital encounter of 05/28/16 (from the past 24 hour(s))  CBC     Status: Abnormal   Collection Time: 05/30/16  4:23 AM  Result Value Ref Range   WBC 10.7 3.6 - 11.0 K/uL   RBC 2.63 (L) 3.80 - 5.20 MIL/uL   Hemoglobin 8.4 (L) 12.0 - 16.0 g/dL   HCT 24.3 (L) 35.0 - 47.0 %   MCV 92.2 80.0 - 100.0 fL   MCH 31.7 26.0 - 34.0 pg   MCHC 34.4 32.0 - 36.0 g/dL   RDW 19.3 (H) 11.5 - 14.5 %   Platelets 264 150 - 440 K/uL  Basic metabolic panel     Status: Abnormal   Collection Time: 05/30/16  4:23 AM  Result Value Ref Range   Sodium 139 135 - 145 mmol/L   Potassium 3.6 3.5 - 5.1 mmol/L   Chloride 99 (L) 101 - 111 mmol/L   CO2 34 (H) 22 - 32 mmol/L   Glucose, Bld 101 (H) 65 - 99 mg/dL   BUN 20 6 - 20 mg/dL   Creatinine, Ser 4.50 (H) 0.44 - 1.00 mg/dL   Calcium 7.6 (L) 8.9 - 10.3 mg/dL   GFR calc non Af Amer 10 (L) >60 mL/min   GFR calc Af Amer 11 (L) >60  mL/min   Anion gap 6 5 - 15  MRSA PCR Screening     Status: None   Collection Time: 05/30/16 12:09 PM  Result Value Ref Range   MRSA by PCR NEGATIVE NEGATIVE  Phosphorus     Status: Abnormal   Collection Time: 05/30/16  1:55 PM  Result Value Ref Range   Phosphorus 4.7 (H) 2.5 - 4.6 mg/dL      Recent Labs  05/28/16 1122  05/29/16 0445 05/29/16 1714 05/30/16 0423  WBC 9.8  --  8.9  --  10.7  HGB 5.0*  < > 5.8* 8.8* 8.4*  HCT 14.9*  < > 16.6* 24.9* 24.3*  PLT 248  --  250  --  264  < > = values in this interval not displayed. BMET  Recent Labs  05/28/16 1122 05/29/16 0445 05/30/16 0423  NA 135 139 139  K 4.7 3.6 3.6  CL 97* 99* 99*  CO2 23 34* 34*  GLUCOSE 203* 100* 101*  BUN 80* 27* 20  CREATININE 11.31* 6.27* 4.50*  CALCIUM 7.0*  7.6* 7.6*   LFT  Recent Labs  05/28/16 1122  PROT 5.7*  ALBUMIN 3.2*  AST 23  ALT 15  ALKPHOS 82  BILITOT 0.4   PT/INR  Recent Labs  05/28/16 1122  LABPROT 14.4  INR 1.10   Hepatitis Panel  Recent Labs  05/28/16 1658  HEPBSAG Negative   C-Diff No results for input(s): CDIFFTOX in the last 72 hours. No results for input(s): CDIFFPCR in the last 72 hours.   Studies/Results: No results found.  Scheduled Inpatient Medications:   . sodium chloride  10 mL/hr Intravenous Once  . sodium chloride   Intravenous Once  . ALPRAZolam  0.5 mg Oral BID  . calcium acetate  1,334 mg Oral TID WC  . calcium carbonate  500 mg Oral BID WC  . cinacalcet  60 mg Oral Q breakfast  . ezetimibe  10 mg Oral Daily  . feeding supplement (PRO-STAT SUGAR FREE 64)  30 mL Oral BID  . furosemide  40 mg Oral Q T,Th,S,Su  . mometasone-formoterol  2 puff Inhalation BID  . multivitamin  1 tablet Oral QHS  . pantoprazole  40 mg Oral BID  . sertraline  50 mg Oral Daily  . sodium chloride flush  3 mL Intravenous Q12H    Continuous Inpatient Infusions:     PRN Inpatient Medications:  acetaminophen **OR** acetaminophen, albuterol, calcium  acetate, fluticasone, lidocaine-prilocaine, ondansetron **OR** ondansetron (ZOFRAN) IV  Miscellaneous:   Assessment:  1) GI blood loss, anemia (multifactorial) and heme positive stool. Hemodynamically stable.  On ppi.  No repeat bleeding. Feeling better post tfx.   Plan:  1) egd Friday.  Can go to full liquid diet for now.  Discussed with Dr Carlynn Spry.   Lollie Sails MD 05/30/2016, 6:16 PM

## 2016-05-30 NOTE — Progress Notes (Signed)
Pre dialysis  

## 2016-05-30 NOTE — Care Management Important Message (Signed)
Important Message  Patient Details  Name: Michele Nash MRN: [id number] Date of Birth: Dec 02, 1955   Medicare Important Message Given:  Yes    [name] 05/30/2016, 2:15 PM

## 2016-05-30 NOTE — Progress Notes (Signed)
Patient ID: Michele Nash, female   DOB: Sep 03, 1956, 60 y.o.   MRN: [id number] [facility] PROGRESS NOTE  Michele Nash [id number] DOB: 06/16/1956 DOA: 05/28/2016 PCP: [name], MD  HPI/Subjective: Wants to eat, Hb 8.4  Objective: Filed Vitals:   05/30/16 1700 05/30/16 1709  BP: 119/65 122/82  Pulse: 63 62  Temp: 97.5 F (36.4 C)   Resp: 19 20    Filed Weights   05/29/16 1033 05/29/16 1414 05/30/16 1345  Weight: 55.6 kg (122 lb 9.2 oz) 54.1 kg (119 lb 4.3 oz) 56.4 kg (124 lb 5.4 oz)    ROS: Review of Systems  Constitutional: Negative for fever and chills.  Eyes: Negative for blurred vision.  Respiratory: Negative for cough and shortness of breath.   Cardiovascular: Negative for chest pain.  Gastrointestinal: Positive for melena. Negative for nausea, vomiting, abdominal pain, diarrhea and constipation.  Genitourinary: Negative for dysuria.  Musculoskeletal: Negative for joint pain.  Neurological: Negative for dizziness and headaches.   Exam: Physical Exam  HENT:  Nose: No mucosal edema.  Mouth/Throat: No oropharyngeal exudate or posterior oropharyngeal edema.  Eyes: EOM and lids are normal. Pupils are equal, round, and reactive to light.  Conjunctiva pale  Neck: No JVD present. Carotid bruit is not present. No edema present. No thyroid mass and no thyromegaly present.  Cardiovascular: S1 normal and S2 normal.  Exam reveals no gallop.   Murmur heard.  Systolic murmur is present with a grade of 4/6  Respiratory: No respiratory distress. She has no wheezes. She has no rhonchi. She has no rales.  GI: Soft. Bowel sounds are normal. There is no tenderness.  Musculoskeletal:       Right ankle: She exhibits no swelling.       Left ankle: She exhibits no swelling.  Lymphadenopathy:    She has no cervical adenopathy.  Neurological: She is alert. No cranial nerve deficit.  Skin: Skin is warm. No rash noted. Nails show no clubbing.  Psychiatric: She has a normal  mood and affect.      Data Reviewed: Basic Metabolic Panel:  Recent Labs Lab 05/28/16 1122 05/28/16 1658 05/29/16 0445 05/30/16 0423 05/30/16 1355  NA 135  --  139 139  --   K 4.7  --  3.6 3.6  --   CL 97*  --  99* 99*  --   CO2 23  --  34* 34*  --   GLUCOSE 203*  --  100* 101*  --   BUN 80*  --  27* 20  --   CREATININE 11.31*  --  6.27* 4.50*  --   CALCIUM 7.0*  --  7.6* 7.6*  --   PHOS  --  6.4* 4.0  --  4.7*   Liver Function Tests:  Recent Labs Lab 05/28/16 1122  AST 23  ALT 15  ALKPHOS 82  BILITOT 0.4  PROT 5.7*  ALBUMIN 3.2*   CBC:  Recent Labs Lab 05/28/16 1122 05/28/16 1800 05/29/16 0445 05/29/16 1714 05/30/16 0423  WBC 9.8  --  8.9  --  10.7  NEUTROABS 8.0*  --   --   --   --   HGB 5.0* 5.9* 5.8* 8.8* 8.4*  HCT 14.9* 17.3* 16.6* 24.9* 24.3*  MCV 101.9*  --  93.3  --  92.2  PLT 248  --  250  --  264   Cardiac Enzymes:  Recent Labs Lab 05/28/16 1122  TROPONINI <0.03     Studies: No  results found.  Scheduled Meds: . sodium chloride  10 mL/hr Intravenous Once  . sodium chloride   Intravenous Once  . ALPRAZolam  0.5 mg Oral BID  . calcium acetate  1,334 mg Oral TID WC  . calcium carbonate  500 mg Oral BID WC  . carvedilol  25 mg Oral BID WC  . cinacalcet  60 mg Oral Q breakfast  . ezetimibe  10 mg Oral Daily  . feeding supplement (PRO-STAT SUGAR FREE 64)  30 mL Oral BID  . furosemide  40 mg Oral Q T,Th,S,Su  . mometasone-formoterol  2 puff Inhalation BID  . multivitamin  1 tablet Oral QHS  . pantoprazole  40 mg Oral BID  . sertraline  50 mg Oral Daily  . sodium chloride flush  3 mL Intravenous Q12H    Assessment/Plan:  1. Symptomatic blood loss anemia: s/p 3 unit of packed red blood cells thus far. Hb 8.4 (5.8). monitor 2. GI bleed. Plavix on hold. As per gastrointestinal note they would like Plavix on hold for 5-7 days prior to any procedure. Potentially these procedures may be done this Friday if Hb continues to drop, if not  can be done as outpatient. 3. End-stage renal disease on dialysis. Dialysis per nephro 4. Hypotension: resolved. Stop coreg as BP well controlled 5. Back pain thoracic spine x-ray negative 6. History of COPD. Currently on oxygen  Code Status: FULL CODE Disposition Plan: Potentially home next few days depending on how she does  Consultants:  Nephrology  Gastroenterology  Time spent: 25 minutes  [facility], [place]

## 2016-05-30 NOTE — Progress Notes (Signed)
Dialysis started 

## 2016-05-30 NOTE — Progress Notes (Signed)
[facility]  ROUNDING NOTE   Subjective:  Patient due for hemodialysis again today as this is her normal day. Hemoglobin currently 8.4.  Objective:  Vital signs in last 24 hours:  Temp:  [97.7 F (36.5 C)-99.4 F (37.4 C)] 98.9 F (37.2 C) (07/05 0602) Pulse Rate:  [58-71] 71 (07/05 0839) Resp:  [15-24] 18 (07/05 0602) BP: (123-183)/(38-85) 138/38 mmHg (07/05 0839) SpO2:  [100 %] 100 % (07/05 0602) Weight:  [54.1 kg (119 lb 4.3 oz)] 54.1 kg (119 lb 4.3 oz) (07/04 1414)  Weight change: -3.594 kg (-7 lb 14.8 oz) Filed Weights   05/28/16 1950 05/29/16 1033 05/29/16 1414  Weight: 57 kg (125 lb 10.6 oz) 55.6 kg (122 lb 9.2 oz) 54.1 kg (119 lb 4.3 oz)    Intake/Output: I/O last 3 completed shifts: In: [id number] [P.O.:1200; I.V.:3; Blood:521] Out: 3920 [Other:3920]   Intake/Output this shift:  Total I/O In: 750 [P.O.:750] Out: -   Physical Exam: General: NAD, resting in bed  Head: Normocephalic, atraumatic. Moist oral mucosal membranes  Eyes: Anicteric  Neck: Supple, trachea midline  Lungs:  Clear to auscultation, normal effort  Heart: S1S2 no rubs  Abdomen:  Soft, nontender, BS present   Extremities: No peripheral edema.  Neurologic: Nonfocal, moving all four extremities  Skin: No lesions  Access: LUE AVF    Basic Metabolic Panel:  Recent Labs Lab 05/28/16 1122 05/28/16 1658 05/29/16 0445 05/30/16 0423  NA 135  --  139 139  K 4.7  --  3.6 3.6  CL 97*  --  99* 99*  CO2 23  --  34* 34*  GLUCOSE 203*  --  100* 101*  BUN 80*  --  27* 20  CREATININE 11.31*  --  6.27* 4.50*  CALCIUM 7.0*  --  7.6* 7.6*  PHOS  --  6.4* 4.0  --     Liver Function Tests:  Recent Labs Lab 05/28/16 1122  AST 23  ALT 15  ALKPHOS 82  BILITOT 0.4  PROT 5.7*  ALBUMIN 3.2*   No results for input(s): LIPASE, AMYLASE in the last 168 hours. No results for input(s): AMMONIA in the last 168 hours.  CBC:  Recent Labs Lab 05/28/16 1122 05/28/16 1800 05/29/16 0445  05/29/16 1714 05/30/16 0423  WBC 9.8  --  8.9  --  10.7  NEUTROABS 8.0*  --   --   --   --   HGB 5.0* 5.9* 5.8* 8.8* 8.4*  HCT 14.9* 17.3* 16.6* 24.9* 24.3*  MCV 101.9*  --  93.3  --  92.2  PLT 248  --  250  --  264    Cardiac Enzymes:  Recent Labs Lab 05/28/16 1122  TROPONINI <0.03    BNP: Invalid input(s): POCBNP  CBG: No results for input(s): GLUCAP in the last 168 hours.  Microbiology: Results for orders placed or performed during the hospital encounter of 04/25/16  MRSA PCR Screening     Status: None   Collection Time: 04/25/16  6:30 PM  Result Value Ref Range Status   MRSA by PCR NEGATIVE NEGATIVE Final    Comment:        The GeneXpert MRSA Assay (FDA approved for NASAL specimens only), is one component of a comprehensive MRSA colonization surveillance program. It is not intended to diagnose MRSA infection nor to guide or monitor treatment for MRSA infections.     Coagulation Studies:  Recent Labs  05/28/16 1122  LABPROT 14.4  INR 1.10  Urinalysis: No results for input(s): COLORURINE, LABSPEC, PHURINE, GLUCOSEU, HGBUR, BILIRUBINUR, KETONESUR, PROTEINUR, UROBILINOGEN, NITRITE, LEUKOCYTESUR in the last 72 hours.  Invalid input(s): APPERANCEUR    Imaging: Dg Thoracic Spine 2 View  05/28/2016  CLINICAL DATA:  Weakness. EXAM: THORACIC SPINE 2 VIEWS COMPARISON:  05/02/2016 FINDINGS: The alignment of the thoracic spine appears normal. The vertebral body heights are well preserved. There is no fracture or subluxation identified. Aortic atherosclerosis identified. IMPRESSION: 1. No acute cardiopulmonary abnormalities. Electronically Signed   By: Kerby Moors M.D.   On: 05/28/2016 14:02     Medications:     . sodium chloride  10 mL/hr Intravenous Once  . sodium chloride   Intravenous Once  . ALPRAZolam  0.5 mg Oral BID  . calcium acetate  1,334 mg Oral TID WC  . calcium carbonate  500 mg Oral BID WC  . carvedilol  25 mg Oral BID WC  .  cinacalcet  60 mg Oral Q breakfast  . ezetimibe  10 mg Oral Daily  . feeding supplement (PRO-STAT SUGAR FREE 64)  30 mL Oral BID  . furosemide  40 mg Oral Q T,Th,S,Su  . mometasone-formoterol  2 puff Inhalation BID  . multivitamin  1 tablet Oral QHS  . pantoprazole  40 mg Oral BID  . sertraline  50 mg Oral Daily  . sodium chloride flush  3 mL Intravenous Q12H   acetaminophen **OR** acetaminophen, albuterol, calcium acetate, fluticasone, lidocaine-prilocaine, ondansetron **OR** ondansetron (ZOFRAN) IV  Assessment/ Plan:  60 y.o. female with pmhx of HTN, hyperlipidemia, diastolic heart failure, hypertension, COPD, tobacco abuse, AOCD, SHPTH, LUE AVF, ESRD 02/2013, admission for severe anemia/black stools 05/28/16.  [facility]/MWF  1. End-stage renal disease on hemodialysis MWF. Patient due for dialysis today per her normal schedule. Orders have been prepared.  2. Anemia of chronic kidney disease/GI bleed. Patient having black tarry stools at home.  - Hemoglobin up to 8.4 posttransfusion. Endoscopy held for 5-7 days from admission given the possibility of Plavix administration at home.   3. Secondary hyperparathyroidism. Recheck phosphorus today.  Otherwise continue PhosLo.    LOS: 2 Princesa Willig 7/5/201711:32 AM

## 2016-05-31 LAB — BASIC METABOLIC PANEL
Anion gap: 6 (ref 5–15)
BUN: 19 mg/dL (ref 6–20)
CHLORIDE: 99 mmol/L — AB (ref 101–111)
CO2: 34 mmol/L — ABNORMAL HIGH (ref 22–32)
CREATININE: 4.35 mg/dL — AB (ref 0.44–1.00)
Calcium: 7.5 mg/dL — ABNORMAL LOW (ref 8.9–10.3)
GFR calc non Af Amer: 10 mL/min — ABNORMAL LOW (ref >60)
GFR, EST AFRICAN AMERICAN: 12 mL/min — AB (ref >60)
Glucose, Bld: 120 mg/dL — ABNORMAL HIGH (ref 65–99)
POTASSIUM: 3.6 mmol/L (ref 3.5–5.1)
SODIUM: 139 mmol/L (ref 135–145)

## 2016-05-31 LAB — CBC
HCT: 24.5 % — ABNORMAL LOW (ref 35.0–47.0)
HEMOGLOBIN: 8.4 g/dL — AB (ref 12.0–16.0)
MCH: 31.9 pg (ref 26.0–34.0)
MCHC: 34.2 g/dL (ref 32.0–36.0)
MCV: 93.3 fL (ref 80.0–100.0)
Platelets: 276 10*3/uL (ref 150–440)
RBC: 2.63 MIL/uL — AB (ref 3.80–5.20)
RDW: 19.6 % — ABNORMAL HIGH (ref 11.5–14.5)
WBC: 9.2 10*3/uL (ref 3.6–11.0)

## 2016-05-31 LAB — PARATHYROID HORMONE, INTACT (NO CA): PTH: 321 pg/mL — AB (ref 15–65)

## 2016-05-31 MED ORDER — ALUM & MAG HYDROXIDE-SIMETH 200-200-20 MG/5ML PO SUSP
30.0000 mL | Freq: Four times a day (QID) | ORAL | Status: DC | PRN
  Administered 2016-06-01: 30 mL via ORAL
  Filled 2016-05-31 (×2): qty 30

## 2016-05-31 NOTE — Progress Notes (Addendum)
Patient ID: [name], female   DOB: May 10, 1956, 60 y.o.   MRN: [id number] [facility] PROGRESS NOTE  Michele Nash [id number] DOB: 11-18-1956 DOA: 05/28/2016 PCP: [name], MD  HPI/Subjective: No complaint, tolerated soft diet.  Objective: Filed Vitals:   05/31/16 0502 05/31/16 1240  BP: 138/59 115/72  Pulse: 62 73  Temp: 98.3 F (36.8 C) 98.4 F (36.9 C)  Resp: 17 18    Filed Weights   05/29/16 1033 05/29/16 1414 05/30/16 1345  Weight: 122 lb 9.2 oz (55.6 kg) 119 lb 4.3 oz (54.1 kg) 124 lb 5.4 oz (56.4 kg)    ROS: Review of Systems  Constitutional: Negative for fever and chills.  Eyes: Negative for blurred vision.  Respiratory: Negative for cough and shortness of breath.   Cardiovascular: Negative for chest pain.  Gastrointestinal: No melena or bloody stool. Negative for nausea, vomiting, abdominal pain, diarrhea and constipation.  Genitourinary: Negative for dysuria.  Musculoskeletal: Negative for joint pain.  Neurological: Negative for dizziness and headaches.   Exam: Physical Exam  HENT:  Nose: No mucosal edema.  Mouth/Throat: No oropharyngeal exudate or posterior oropharyngeal edema.  Eyes: EOM and lids are normal. Pupils are equal, round, and reactive to light.  Conjunctiva pale  Neck: No JVD present. Carotid bruit is not present. No edema present. No thyroid mass and no thyromegaly present.  Cardiovascular: S1 normal and S2 normal.  Exam reveals no gallop.   Murmur heard.  Systolic murmur is present with a grade of 4/6  Respiratory: No respiratory distress. She has no wheezes. She has no rhonchi. She has no rales.  GI: Soft. Bowel sounds are normal. There is no tenderness.  Musculoskeletal:       Right ankle: She exhibits no swelling.       Left ankle: She exhibits no swelling.  Lymphadenopathy:    She has no cervical adenopathy.  Neurological: She is alert. No cranial nerve deficit.  Skin: Skin is warm. No rash noted. Nails show no  clubbing.  Psychiatric: She has a normal mood and affect.      Data Reviewed: Basic Metabolic Panel:  Recent Labs Lab 05/28/16 1122 05/28/16 1658 05/29/16 0445 05/30/16 0423 05/30/16 1355 05/31/16 0438  NA 135  --  139 139  --  139  K 4.7  --  3.6 3.6  --  3.6  CL 97*  --  99* 99*  --  99*  CO2 23  --  34* 34*  --  34*  GLUCOSE 203*  --  100* 101*  --  120*  BUN 80*  --  27* 20  --  19  CREATININE 11.31*  --  6.27* 4.50*  --  4.35*  CALCIUM 7.0*  --  7.6* 7.6*  --  7.5*  PHOS  --  6.4* 4.0  --  4.7*  --    Liver Function Tests:  Recent Labs Lab 05/28/16 1122  AST 23  ALT 15  ALKPHOS 82  BILITOT 0.4  PROT 5.7*  ALBUMIN 3.2*   CBC:  Recent Labs Lab 05/28/16 1122 05/28/16 1800 05/29/16 0445 05/29/16 1714 05/30/16 0423 05/31/16 0438  WBC 9.8  --  8.9  --  10.7 9.2  NEUTROABS 8.0*  --   --   --   --   --   HGB 5.0* 5.9* 5.8* 8.8* 8.4* 8.4*  HCT 14.9* 17.3* 16.6* 24.9* 24.3* 24.5*  MCV 101.9*  --  93.3  --  92.2 93.3  PLT 248  --  250  --  264 276   Cardiac Enzymes:  Recent Labs Lab 05/28/16 1122  TROPONINI <0.03     Studies: No results found.  Scheduled Meds: . sodium chloride  10 mL/hr Intravenous Once  . sodium chloride   Intravenous Once  . ALPRAZolam  0.5 mg Oral BID  . calcium acetate  1,334 mg Oral TID WC  . calcium carbonate  500 mg Oral BID WC  . cinacalcet  60 mg Oral Q breakfast  . ezetimibe  10 mg Oral Daily  . feeding supplement (PRO-STAT SUGAR FREE 64)  30 mL Oral BID  . furosemide  40 mg Oral Q T,Th,S,Su  . mometasone-formoterol  2 puff Inhalation BID  . multivitamin  1 tablet Oral QHS  . pantoprazole  40 mg Oral BID  . sertraline  50 mg Oral Daily  . sodium chloride flush  3 mL Intravenous Q12H    Assessment/Plan:  1. Symptomatic blood loss anemia: s/p 3 unit of packed red blood cells thus far. Hb 8.4 (5.8) is stable for the past 3 days. 2. GI bleed. Plavix on hold. EGD tomorrow per Dr. Gustavo Lah. 3. End-stage renal  disease on dialysis. Dialysis per nephro 4. Hypotension: resolved. Stopped coreg as BP well controlled 5. Back pain thoracic spine x-ray negative 6. History of COPD. Currently on oxygen  Code Status: FULL CODE Disposition Plan: Potentially home with  tomorrow after EGD and HD. Consultants:  Nephrology  Gastroenterology  Time spent: 27 minutes  Michele Nash  [employer]

## 2016-05-31 NOTE — Progress Notes (Signed)
[facility]  ROUNDING NOTE   Subjective:  atient completed hemodialysis yesterday. Resting in bed comfortably. Hemoglobin stable at 8.4.   Objective:  Vital signs in last 24 hours:  Temp:  [97.5 F (36.4 C)-100.1 F (37.8 C)] 98.4 F (36.9 C) (07/06 1240) Pulse Rate:  [62-73] 73 (07/06 1240) Resp:  [17-22] 18 (07/06 1240) BP: (105-138)/(59-86) 115/72 mmHg (07/06 1240) SpO2:  [94 %-100 %] 100 % (07/06 1240)  Weight change: 0.8 kg (1 lb 12.2 oz) Filed Weights   05/29/16 1033 05/29/16 1414 05/30/16 1345  Weight: 55.6 kg (122 lb 9.2 oz) 54.1 kg (119 lb 4.3 oz) 56.4 kg (124 lb 5.4 oz)    Intake/Output: I/O last 3 completed shifts: In: 1310 [P.O.:1310] Out: 150 [Urine:150]   Intake/Output this shift:  Total I/O In: 800 [P.O.:800] Out: 150 [Urine:150]  Physical Exam: General: NAD, resting in bed  Head: Normocephalic, atraumatic. Moist oral mucosal membranes  Eyes: Anicteric  Neck: Supple, trachea midline  Lungs:  Clear to auscultation, normal effort  Heart: S1S2 no rubs  Abdomen:  Soft, nontender, BS present   Extremities: No peripheral edema.  Neurologic: Nonfocal, moving all four extremities  Skin: No lesions  Access: LUE AVF    Basic Metabolic Panel:  Recent Labs Lab 05/28/16 1122 05/28/16 1658 05/29/16 0445 05/30/16 0423 05/30/16 1355 05/31/16 0438  NA 135  --  139 139  --  139  K 4.7  --  3.6 3.6  --  3.6  CL 97*  --  99* 99*  --  99*  CO2 23  --  34* 34*  --  34*  GLUCOSE 203*  --  100* 101*  --  120*  BUN 80*  --  27* 20  --  19  CREATININE 11.31*  --  6.27* 4.50*  --  4.35*  CALCIUM 7.0*  --  7.6* 7.6*  --  7.5*  PHOS  --  6.4* 4.0  --  4.7*  --     Liver Function Tests:  Recent Labs Lab 05/28/16 1122  AST 23  ALT 15  ALKPHOS 82  BILITOT 0.4  PROT 5.7*  ALBUMIN 3.2*   No results for input(s): LIPASE, AMYLASE in the last 168 hours. No results for input(s): AMMONIA in the last 168 hours.  CBC:  Recent Labs Lab  05/28/16 1122 05/28/16 1800 05/29/16 0445 05/29/16 1714 05/30/16 0423 05/31/16 0438  WBC 9.8  --  8.9  --  10.7 9.2  NEUTROABS 8.0*  --   --   --   --   --   HGB 5.0* 5.9* 5.8* 8.8* 8.4* 8.4*  HCT 14.9* 17.3* 16.6* 24.9* 24.3* 24.5*  MCV 101.9*  --  93.3  --  92.2 93.3  PLT 248  --  250  --  264 276    Cardiac Enzymes:  Recent Labs Lab 05/28/16 1122  TROPONINI <0.03    BNP: Invalid input(s): POCBNP  CBG: No results for input(s): GLUCAP in the last 168 hours.  Microbiology: Results for orders placed or performed during the hospital encounter of 05/28/16  MRSA PCR Screening     Status: None   Collection Time: 05/30/16 12:09 PM  Result Value Ref Range Status   MRSA by PCR NEGATIVE NEGATIVE Final    Comment:        The GeneXpert MRSA Assay (FDA approved for NASAL specimens only), is one component of a comprehensive MRSA colonization surveillance program. It is not intended to diagnose MRSA infection  nor to guide or monitor treatment for MRSA infections.     Coagulation Studies: No results for input(s): LABPROT, INR in the last 72 hours.  Urinalysis: No results for input(s): COLORURINE, LABSPEC, PHURINE, GLUCOSEU, HGBUR, BILIRUBINUR, KETONESUR, PROTEINUR, UROBILINOGEN, NITRITE, LEUKOCYTESUR in the last 72 hours.  Invalid input(s): APPERANCEUR    Imaging: No results found.   Medications:     . sodium chloride  10 mL/hr Intravenous Once  . sodium chloride   Intravenous Once  . ALPRAZolam  0.5 mg Oral BID  . calcium acetate  1,334 mg Oral TID WC  . calcium carbonate  500 mg Oral BID WC  . cinacalcet  60 mg Oral Q breakfast  . ezetimibe  10 mg Oral Daily  . feeding supplement (PRO-STAT SUGAR FREE 64)  30 mL Oral BID  . furosemide  40 mg Oral Q T,Th,S,Su  . mometasone-formoterol  2 puff Inhalation BID  . multivitamin  1 tablet Oral QHS  . pantoprazole  40 mg Oral BID  . sertraline  50 mg Oral Daily  . sodium chloride flush  3 mL Intravenous Q12H    acetaminophen **OR** acetaminophen, albuterol, calcium acetate, fluticasone, lidocaine-prilocaine, ondansetron **OR** ondansetron (ZOFRAN) IV  Assessment/ Plan:  60 y.o. female with pmhx of HTN, hyperlipidemia, diastolic heart failure, hypertension, COPD, tobacco abuse, AOCD, SHPTH, LUE AVF, ESRD 02/2013, admission for severe anemia/black stools 05/28/16.  [facility]/MWF  1. End-stage renal disease on hemodialysis MWF. No acute indication for dialysis today.  We will plan for dialysis again tomorrow.  2. Anemia of chronic kidney disease/GI bleed. Patient having black tarry stools at home.  - hemoglobin stable at 8.4.  Patient status post multiple blood transfusion this admission.  Continue to monitor.   3. Secondary hyperparathyroidism. Phosphorus currently 4.7.  Continue to periodically monitor.  Continue calcium acetate 2 tablets by mouth 3 times a day with meals..    LOS: 3 Noboru Bidinger 7/6/20173:39 PM

## 2016-05-31 NOTE — Consult Note (Signed)
Subjective: Patient seen for anemia, Hemoccult positive stool, possible melena. No bowel movement reported since admission. Denies nausea vomiting or abdominal pain.  Objective: Vital signs in last 24 hours: Temp:  [98.3 F (36.8 C)-100.1 F (37.8 C)] 98.4 F (36.9 C) (07/06 1240) Pulse Rate:  [62-73] 73 (07/06 1240) Resp:  [17-19] 18 (07/06 1240) BP: (105-138)/(59-86) 115/72 mmHg (07/06 1240) SpO2:  [94 %-100 %] 100 % (07/06 1240) Blood pressure 115/72, pulse 73, temperature 98.4 F (36.9 C), temperature source Oral, resp. rate 18, height 5\' 5"  (1.651 m), weight 56.4 kg (124 lb 5.4 oz), SpO2 100 %.   Intake/Output from previous day: 07/05 0701 - 07/06 0700 In: 1310 [P.O.:1310] Out: 150 [Urine:150]  Intake/Output this shift: Total I/O In: 800 [P.O.:800] Out: 150 [Urine:150]   General appearance:  60 year old female no distress Resp:  Bilaterally clear to auscultation Cardio:  Regular rate and rhythm without rub or gallop GI:  Soft nontender nondistended bowel sounds positive normoactive Extremities:     Lab Results: Results for orders placed or performed during the hospital encounter of 05/28/16 (from the past 24 hour(s))  CBC     Status: Abnormal   Collection Time: 05/31/16  4:38 AM  Result Value Ref Range   WBC 9.2 3.6 - 11.0 K/uL   RBC 2.63 (L) 3.80 - 5.20 MIL/uL   Hemoglobin 8.4 (L) 12.0 - 16.0 g/dL   HCT 24.5 (L) 35.0 - 47.0 %   MCV 93.3 80.0 - 100.0 fL   MCH 31.9 26.0 - 34.0 pg   MCHC 34.2 32.0 - 36.0 g/dL   RDW 19.6 (H) 11.5 - 14.5 %   Platelets 276 150 - 440 K/uL  Basic metabolic panel     Status: Abnormal   Collection Time: 05/31/16  4:38 AM  Result Value Ref Range   Sodium 139 135 - 145 mmol/L   Potassium 3.6 3.5 - 5.1 mmol/L   Chloride 99 (L) 101 - 111 mmol/L   CO2 34 (H) 22 - 32 mmol/L   Glucose, Bld 120 (H) 65 - 99 mg/dL   BUN 19 6 - 20 mg/dL   Creatinine, Ser 4.35 (H) 0.44 - 1.00 mg/dL   Calcium 7.5 (L) 8.9 - 10.3 mg/dL   GFR calc non Af Amer  10 (L) >60 mL/min   GFR calc Af Amer 12 (L) >60 mL/min   Anion gap 6 5 - 15      Recent Labs  05/29/16 0445 05/29/16 1714 05/30/16 0423 05/31/16 0438  WBC 8.9  --  10.7 9.2  HGB 5.8* 8.8* 8.4* 8.4*  HCT 16.6* 24.9* 24.3* 24.5*  PLT 250  --  264 276   BMET  Recent Labs  05/29/16 0445 05/30/16 0423 05/31/16 0438  NA 139 139 139  K 3.6 3.6 3.6  CL 99* 99* 99*  CO2 34* 34* 34*  GLUCOSE 100* 101* 120*  BUN 27* 20 19  CREATININE 6.27* 4.50* 4.35*  CALCIUM 7.6* 7.6* 7.5*   LFT No results for input(s): PROT, ALBUMIN, AST, ALT, ALKPHOS, BILITOT, BILIDIR, IBILI in the last 72 hours. PT/INR No results for input(s): LABPROT, INR in the last 72 hours. Hepatitis Panel No results for input(s): HEPBSAG, HCVAB, HEPAIGM, HEPBIGM in the last 72 hours. C-Diff No results for input(s): CDIFFTOX in the last 72 hours. No results for input(s): CDIFFPCR in the last 72 hours.   Studies/Results: No results found.  Scheduled Inpatient Medications:   . sodium chloride  10 mL/hr Intravenous Once  . sodium  chloride   Intravenous Once  . ALPRAZolam  0.5 mg Oral BID  . calcium acetate  1,334 mg Oral TID WC  . calcium carbonate  500 mg Oral BID WC  . cinacalcet  60 mg Oral Q breakfast  . ezetimibe  10 mg Oral Daily  . feeding supplement (PRO-STAT SUGAR FREE 64)  30 mL Oral BID  . furosemide  40 mg Oral Q T,Th,S,Su  . mometasone-formoterol  2 puff Inhalation BID  . multivitamin  1 tablet Oral QHS  . pantoprazole  40 mg Oral BID  . sertraline  50 mg Oral Daily  . sodium chloride flush  3 mL Intravenous Q12H    Continuous Inpatient Infusions:     PRN Inpatient Medications:  acetaminophen **OR** acetaminophen, albuterol, calcium acetate, fluticasone, lidocaine-prilocaine, ondansetron **OR** ondansetron (ZOFRAN) IV  Miscellaneous:   Assessment:  1. Anemia, Hemoccult-positive stool, reported melena. No evidence of ongoing GI bleeding since admission. It is of note that her last dose  of Plavix was the morning before she came into the hospital that is Monday. She will be 5 days off Plavix on Saturday. I will discuss with on-call physician over the weekend if it is possible get her EGD done on Saturday.  Plan:  1. Continue current  Lollie Sails MD 05/31/2016, 6:06 PM

## 2016-06-01 LAB — PHOSPHORUS: PHOSPHORUS: 4 mg/dL (ref 2.5–4.6)

## 2016-06-01 MED ORDER — BISACODYL 5 MG PO TBEC
10.0000 mg | DELAYED_RELEASE_TABLET | Freq: Every day | ORAL | Status: DC | PRN
  Filled 2016-06-01: qty 2

## 2016-06-01 MED ORDER — DOCUSATE SODIUM 100 MG PO CAPS
100.0000 mg | ORAL_CAPSULE | Freq: Two times a day (BID) | ORAL | Status: DC
  Administered 2016-06-01 (×2): 100 mg via ORAL
  Filled 2016-06-01 (×5): qty 1

## 2016-06-01 NOTE — Consult Note (Signed)
Patient with hx of melena, will do EGD tomorrow morning, discussed with patient and her family (neice)

## 2016-06-01 NOTE — Progress Notes (Signed)
This note also relates to the following rows which could not be included: Resp - Cannot attach notes to unvalidated device data BP - Cannot attach notes to unvalidated device data SpO2 - Cannot attach notes to unvalidated device data   Hemodialysis start

## 2016-06-01 NOTE — Progress Notes (Signed)
Pre-hd tx 

## 2016-06-01 NOTE — Care Management (Signed)
Plan for EGD tomorrow then discharge home.  Chronic O2 through [facility].  Per MD should EGD not be completed tomorrow she will discharge with outpatient follow up

## 2016-06-01 NOTE — Progress Notes (Signed)
Talked to DR. Skulskie and EGD will be done tomorrow and received order to start a soft diet today

## 2016-06-01 NOTE — Evaluation (Signed)
Physical Therapy Evaluation Patient Details Name: Michele Nash MRN: [id number] DOB: 10/09/1956 Today's Date: 06/01/2016   History of Present Illness  Michele Nash is a 60yo black female who comes to [facility] on 7/3 at the recommendation of her dialysis center when her last Hb was revealed to be 5.0. The pt reports several days of lethargy, weakness, and melena. The pt is s/p 3 unite PRBC and is awaiting esophagogastroduodenoscopy. At baseline pt is a community dweller on 3L O2 and s AD. She lives on the second floor without any difficulty with steps at baseline.   Clinical Impression  Pt performing all basic mobility with independence, and stairs with modified independence, self report of mild weakness, but otherwise very near baseline. No additional PT services needed at this time.No PT FU at DC. PT signing off. Recommend continued ambulation ad lib to prevent deconditioning.     Follow Up Recommendations No PT follow up    Equipment Recommendations  None recommended by PT    Recommendations for Other Services       Precautions / Restrictions Precautions Precautions: None      Mobility  Bed Mobility Overal bed mobility: Independent                Transfers Overall transfer level: Independent                  Ambulation/Gait Ambulation/Gait assistance: Modified independent (Device/Increase time) Ambulation Distance (Feet): 560 Feet Assistive device: None   Gait velocity: Max: 1.37m/s (10Meter walk test)       Stairs Stairs: Yes Stairs assistance: Min guard Stair Management: One rail Right;Alternating pattern Number of Stairs: 14    Wheelchair Mobility    Modified Rankin (Stroke Patients Only)       Balance Overall balance assessment: No apparent balance deficits (not formally assessed);Independent                                           Pertinent Vitals/Pain Pain Assessment: No/denies pain    Home Living Family/patient  expects to be discharged to:: Private residence Living Arrangements: Spouse/significant other Available Help at Discharge: Family Type of Home: Apartment Home Access: Stairs to enter Entrance Stairs-Rails: Psychiatric nurse of Steps: 14 Home Layout: One level Home Equipment: None      Prior Function Level of Independence: Independent with assistive device(s)         Comments: O2 at baseline, uses shopping cart for longer trips.      Hand Dominance        Extremity/Trunk Assessment               Lower Extremity Assessment: Overall WFL for tasks assessed         Communication   Communication: No difficulties  Cognition Arousal/Alertness: Awake/alert Behavior During Therapy: WFL for tasks assessed/performed Overall Cognitive Status: Within Functional Limits for tasks assessed                      General Comments      Exercises        Assessment/Plan    PT Assessment Patent does not need any further PT services  PT Diagnosis     PT Problem List    PT Treatment Interventions     PT Goals (Current goals can be found in the Care Plan section) Acute Rehab PT  Goals PT Goal Formulation: All assessment and education complete, DC therapy    Frequency     Barriers to discharge        Co-evaluation               End of Session Equipment Utilized During Treatment: Gait belt;Oxygen Activity Tolerance: Patient tolerated treatment well Patient left: in bed;with call bell/phone within reach Nurse Communication: Other (comment)         Time: [id number] PT Time Calculation (min) (ACUTE ONLY): 14 min   Charges:   PT Evaluation $PT Eval Low Complexity: 1 Procedure PT Treatments $Therapeutic Activity: 8-22 mins   PT G Codes:        10:51 AM, 2016/06/08 Etta Grandchild, PT, DPT Physical Therapist - [place] [phone] (mobile)

## 2016-06-01 NOTE — Progress Notes (Signed)
[facility]  ROUNDING NOTE   Subjective:  Patient due for endoscopy tomorrow. We will plan for hemodialysis today. Currently in good spirits. emoglobin remains stable.  Objective:  Vital signs in last 24 hours:  Temp:  [97.5 F (36.4 C)-97.7 F (36.5 C)] 97.7 F (36.5 C) (07/07 1220) Pulse Rate:  [65-74] 67 (07/07 1300) Resp:  [16-20] 19 (07/07 1300) BP: (103-140)/(51-92) 140/62 mmHg (07/07 1300) SpO2:  [98 %-100 %] 100 % (07/07 1300)  Weight change:  Filed Weights   05/29/16 1033 05/29/16 1414 05/30/16 1345  Weight: 55.6 kg (122 lb 9.2 oz) 54.1 kg (119 lb 4.3 oz) 56.4 kg (124 lb 5.4 oz)    Intake/Output: I/O last 3 completed shifts: In: 1120 [P.O.:1120] Out: 350 [Urine:350]   Intake/Output this shift:     Physical Exam: General: NAD, resting in bed  Head: Normocephalic, atraumatic. Moist oral mucosal membranes  Eyes: Anicteric  Neck: Supple, trachea midline  Lungs:  Clear to auscultation, normal effort  Heart: S1S2 no rubs  Abdomen:  Soft, nontender, BS present   Extremities: No peripheral edema.  Neurologic: Nonfocal, moving all four extremities  Skin: No lesions  Access: LUE AVF    Basic Metabolic Panel:  Recent Labs Lab 05/28/16 1122 05/28/16 1658 05/29/16 0445 05/30/16 0423 05/30/16 1355 05/31/16 0438  NA 135  --  139 139  --  139  K 4.7  --  3.6 3.6  --  3.6  CL 97*  --  99* 99*  --  99*  CO2 23  --  34* 34*  --  34*  GLUCOSE 203*  --  100* 101*  --  120*  BUN 80*  --  27* 20  --  19  CREATININE 11.31*  --  6.27* 4.50*  --  4.35*  CALCIUM 7.0*  --  7.6* 7.6*  --  7.5*  PHOS  --  6.4* 4.0  --  4.7*  --     Liver Function Tests:  Recent Labs Lab 05/28/16 1122  AST 23  ALT 15  ALKPHOS 82  BILITOT 0.4  PROT 5.7*  ALBUMIN 3.2*   No results for input(s): LIPASE, AMYLASE in the last 168 hours. No results for input(s): AMMONIA in the last 168 hours.  CBC:  Recent Labs Lab 05/28/16 1122 05/28/16 1800 05/29/16 0445  05/29/16 1714 05/30/16 0423 05/31/16 0438  WBC 9.8  --  8.9  --  10.7 9.2  NEUTROABS 8.0*  --   --   --   --   --   HGB 5.0* 5.9* 5.8* 8.8* 8.4* 8.4*  HCT 14.9* 17.3* 16.6* 24.9* 24.3* 24.5*  MCV 101.9*  --  93.3  --  92.2 93.3  PLT 248  --  250  --  264 276    Cardiac Enzymes:  Recent Labs Lab 05/28/16 1122  TROPONINI <0.03    BNP: Invalid input(s): POCBNP  CBG: No results for input(s): GLUCAP in the last 168 hours.  Microbiology: Results for orders placed or performed during the hospital encounter of 05/28/16  MRSA PCR Screening     Status: None   Collection Time: 05/30/16 12:09 PM  Result Value Ref Range Status   MRSA by PCR NEGATIVE NEGATIVE Final    Comment:        The GeneXpert MRSA Assay (FDA approved for NASAL specimens only), is one component of a comprehensive MRSA colonization surveillance program. It is not intended to diagnose MRSA infection nor to guide or monitor treatment  for MRSA infections.     Coagulation Studies: No results for input(s): LABPROT, INR in the last 72 hours.  Urinalysis: No results for input(s): COLORURINE, LABSPEC, PHURINE, GLUCOSEU, HGBUR, BILIRUBINUR, KETONESUR, PROTEINUR, UROBILINOGEN, NITRITE, LEUKOCYTESUR in the last 72 hours.  Invalid input(s): APPERANCEUR    Imaging: No results found.   Medications:     . sodium chloride  10 mL/hr Intravenous Once  . sodium chloride   Intravenous Once  . ALPRAZolam  0.5 mg Oral BID  . calcium acetate  1,334 mg Oral TID WC  . calcium carbonate  500 mg Oral BID WC  . cinacalcet  60 mg Oral Q breakfast  . ezetimibe  10 mg Oral Daily  . feeding supplement (PRO-STAT SUGAR FREE 64)  30 mL Oral BID  . furosemide  40 mg Oral Q T,Th,S,Su  . mometasone-formoterol  2 puff Inhalation BID  . multivitamin  1 tablet Oral QHS  . pantoprazole  40 mg Oral BID  . sertraline  50 mg Oral Daily  . sodium chloride flush  3 mL Intravenous Q12H   acetaminophen **OR** acetaminophen,  albuterol, alum & mag hydroxide-simeth, calcium acetate, fluticasone, lidocaine-prilocaine, ondansetron **OR** ondansetron (ZOFRAN) IV  Assessment/ Plan:  60 y.o. female with pmhx of HTN, hyperlipidemia, diastolic heart failure, hypertension, COPD, tobacco abuse, AOCD, SHPTH, LUE AVF, ESRD 02/2013, admission for severe anemia/black stools 05/28/16.  [facility]/MWF  1. End-stage renal disease on hemodialysis MWF. Patient due for hemodialysis today.  Orders have been prepared.  Next dialysis on Monday if patient is still here.  2. Anemia of chronic kidney disease/GI bleed. Patient having black tarry stools at home.  - Most recent hemoglobin was 8.4.  Patient status post multiple blood transfusions. Continue to monitor hemoglobin.  3. Secondary hyperparathyroidism. Phosphorus under good control at 4.7.  Continue calcium acetate as well as Sensipar 60 mg by mouth daily.    LOS: 4 Chelle Cayton 7/7/20171:27 PM

## 2016-06-01 NOTE — Progress Notes (Signed)
Nutrition Follow-up  DOCUMENTATION CODES:   Severe malnutrition in context of acute illness/injury  INTERVENTION:   Cater to pt preferences on Soft diet order. Recommend Renal Diet order as well as pt on chronic dialysis. Continue Pro-stat as ordered for additional protein.   NUTRITION DIAGNOSIS:   Malnutrition related to acute illness as evidenced by energy intake < or equal to 50% for > or equal to 5 days, mild depletion of muscle mass, percent weight loss; being addressed with diet advancement and protein supplement  GOAL:   Patient will meet greater than or equal to 90% of their needs; ongoing  MONITOR:   PO intake, Diet advancement, Labs, Weight trends, I & O's  REASON FOR ASSESSMENT:   Malnutrition Screening Tool    ASSESSMENT:   Pt admitted with low hemoglobin and black tarry stools. GI consulted.  Pt scheduled for EGD likely tomorrow. Pt currently in HD.  Diet Order:  DIET SOFT Room service appropriate?: Yes; Fluid consistency:: Thin    Current Nutrition: Pt NPO this am for possible EGD. Diet order advanced this am. Pt on soft diet order the past 2 days, per documentation pt eating 100% of meals.   Gastrointestinal Profile: Last BM: 06/01/2016  Medications: Phoslo, Sensipar, Lasix, Protonix, Rena-Vit Labs: Cre 4.35, Ca 7.5, K WDL, Phosphorus 4.7, glucose 120    Weight Trend since Admission: Filed Weights   05/29/16 1033 05/29/16 1414 05/30/16 1345  Weight: 122 lb 9.2 oz (55.6 kg) 119 lb 4.3 oz (54.1 kg) 124 lb 5.4 oz (56.4 kg)     Skin:  Reviewed, no issues  BMI:  Body mass index is 20.69 kg/(m^2).  Estimated Nutritional Needs:   Kcal:  1710-1995kcals  Protein:  68-86g protein  Fluid:  UOP+1L  EDUCATION NEEDS:   Education needs no appropriate at this time  Dwyane Luo, RD, LDN Pager [phone] Weekend/On-Call Pager [phone]

## 2016-06-01 NOTE — Progress Notes (Signed)
Patient ID: Michele Nash, female   DOB: 23-May-1956, 60 y.o.   MRN: [id number] [employer] PROGRESS NOTE  SHILYN ORDONEZ [id number] DOB: [birth year] DOA: 05/28/2016 PCP: [name], MD  HPI/Subjective: No complaint, tolerated soft diet.  Objective: Filed Vitals:   06/01/16 1230 06/01/16 1300  BP: 132/92 140/62  Pulse: 65 67  Temp:    Resp: 18 19    Filed Weights   05/29/16 1033 05/29/16 1414 05/30/16 1345  Weight: 122 lb 9.2 oz (55.6 kg) 119 lb 4.3 oz (54.1 kg) 124 lb 5.4 oz (56.4 kg)    ROS: Review of Systems  Constitutional: Negative for fever and chills.  Eyes: Negative for blurred vision.  Respiratory: Negative for cough and shortness of breath.   Cardiovascular: Negative for chest pain.  Gastrointestinal: No melena or bloody stool. Negative for nausea, vomiting, abdominal pain, diarrhea and constipation.  Genitourinary: Negative for dysuria.  Musculoskeletal: Negative for joint pain.  Neurological: Negative for dizziness and headaches.   Exam: Physical Exam  HENT:  Nose: No mucosal edema.  Mouth/Throat: No oropharyngeal exudate or posterior oropharyngeal edema.  Eyes: EOM and lids are normal. Pupils are equal, round, and reactive to light.  Conjunctiva pale  Neck: No JVD present. Carotid bruit is not present. No edema present. No thyroid mass and no thyromegaly present.  Cardiovascular: S1 normal and S2 normal.  Exam reveals no gallop.   Murmur heard.  Systolic murmur is present with a grade of 4/6  Respiratory: No respiratory distress. She has no wheezes. She has no rhonchi. She has no rales.  GI: Soft. Bowel sounds are normal. There is no tenderness.  Musculoskeletal:       Right ankle: She exhibits no swelling.       Left ankle: She exhibits no swelling.  Lymphadenopathy:    She has no cervical adenopathy.  Neurological: She is alert. No cranial nerve deficit.  Skin: Skin is warm. No rash noted. Nails show no clubbing.  Psychiatric: She has a  normal mood and affect.      Data Reviewed: Basic Metabolic Panel:  Recent Labs Lab 05/28/16 1122 05/28/16 1658 05/29/16 0445 05/30/16 0423 05/30/16 1355 05/31/16 0438  NA 135  --  139 139  --  139  K 4.7  --  3.6 3.6  --  3.6  CL 97*  --  99* 99*  --  99*  CO2 23  --  34* 34*  --  34*  GLUCOSE 203*  --  100* 101*  --  120*  BUN 80*  --  27* 20  --  19  CREATININE 11.31*  --  6.27* 4.50*  --  4.35*  CALCIUM 7.0*  --  7.6* 7.6*  --  7.5*  PHOS  --  6.4* 4.0  --  4.7*  --    Liver Function Tests:  Recent Labs Lab 05/28/16 1122  AST 23  ALT 15  ALKPHOS 82  BILITOT 0.4  PROT 5.7*  ALBUMIN 3.2*   CBC:  Recent Labs Lab 05/28/16 1122 05/28/16 1800 05/29/16 0445 05/29/16 1714 05/30/16 0423 05/31/16 0438  WBC 9.8  --  8.9  --  10.7 9.2  NEUTROABS 8.0*  --   --   --   --   --   HGB 5.0* 5.9* 5.8* 8.8* 8.4* 8.4*  HCT 14.9* 17.3* 16.6* 24.9* 24.3* 24.5*  MCV 101.9*  --  93.3  --  92.2 93.3  PLT 248  --  250  --  264 276   Cardiac Enzymes:  Recent Labs Lab 05/28/16 1122  TROPONINI <0.03     Studies: No results found.  Scheduled Meds: . sodium chloride  10 mL/hr Intravenous Once  . sodium chloride   Intravenous Once  . ALPRAZolam  0.5 mg Oral BID  . calcium acetate  1,334 mg Oral TID WC  . calcium carbonate  500 mg Oral BID WC  . cinacalcet  60 mg Oral Q breakfast  . ezetimibe  10 mg Oral Daily  . feeding supplement (PRO-STAT SUGAR FREE 64)  30 mL Oral BID  . furosemide  40 mg Oral Q T,Th,S,Su  . mometasone-formoterol  2 puff Inhalation BID  . multivitamin  1 tablet Oral QHS  . pantoprazole  40 mg Oral BID  . sertraline  50 mg Oral Daily  . sodium chloride flush  3 mL Intravenous Q12H    Assessment/Plan:  1. Symptomatic blood loss anemia: s/p 3 unit of packed red blood cells thus far. Hb 8.4 (5.8) is stable for the past 3 days. 2. GI bleed. Plavix on hold. EGD tomorrow per Dr. Gustavo Lah. 3. End-stage renal disease on dialysis. Dialysis  today. 4. Hypotension: resolved. Resume coreg as BP well controlled 5. Back pain thoracic spine x-ray negative 6. Chronic respiratory failure and COPD. Currently on oxygen 3 L at home. NEB prn.  Code Status: FULL CODE Disposition Plan: Potentially home with  tomorrow after EGD. Consultants:  Nephrology  Gastroenterology  Time spent: 27 minutes  Demetrios Loll  [employer]

## 2016-06-01 NOTE — Consult Note (Addendum)
Subjective: Patient seen for melena, anemia. Patient denies any nausea or vomiting. There is no abdominal pain. She did have a black stool since yesterday. She has been hemodynamically stable. Hemogram has been stable.  Objective: Vital signs in last 24 hours: Temp:  [97.5 F (36.4 C)-98.8 F (37.1 C)] 98.2 F (36.8 C) (07/07 1652) Pulse Rate:  [65-83] 73 (07/07 1652) Resp:  [16-23] 19 (07/07 1652) BP: (103-163)/(51-117) 140/66 mmHg (07/07 1652) SpO2:  [98 %-100 %] 99 % (07/07 1652) Blood pressure 140/66, pulse 73, temperature 98.2 F (36.8 C), temperature source Oral, resp. rate 19, height 5\' 5"  (1.651 m), weight 56.4 kg (124 lb 5.4 oz), SpO2 99 %.   Intake/Output from previous day: 07/06 0701 - 07/07 0700 In: 800 [P.O.:800] Out: 200 [Urine:200]  Intake/Output this shift: Total I/O In: -  Out: 1650 [Urine:150; Other:1500]   General appearance:  60 year old female no distress Resp:  Bilaterally clear to auscultation Cardio:  Regular rate and rhythm without rub or gallop GI:  Soft nontender nondistended bowel sounds positive normoactive. Extremities:  No clubbing or cyanosis or edema.   Lab Results: Results for orders placed or performed during the hospital encounter of 05/28/16 (from the past 24 hour(s))  Phosphorus     Status: None   Collection Time: 06/01/16 12:37 PM  Result Value Ref Range   Phosphorus 4.0 2.5 - 4.6 mg/dL      Recent Labs  05/29/16 1714 05/30/16 0423 05/31/16 0438  WBC  --  10.7 9.2  HGB 8.8* 8.4* 8.4*  HCT 24.9* 24.3* 24.5*  PLT  --  264 276   BMET  Recent Labs  05/30/16 0423 05/31/16 0438  NA 139 139  K 3.6 3.6  CL 99* 99*  CO2 34* 34*  GLUCOSE 101* 120*  BUN 20 19  CREATININE 4.50* 4.35*  CALCIUM 7.6* 7.5*   LFT No results for input(s): PROT, ALBUMIN, AST, ALT, ALKPHOS, BILITOT, BILIDIR, IBILI in the last 72 hours. PT/INR No results for input(s): LABPROT, INR in the last 72 hours. Hepatitis Panel No results for input(s):  HEPBSAG, HCVAB, HEPAIGM, HEPBIGM in the last 72 hours. C-Diff No results for input(s): CDIFFTOX in the last 72 hours. No results for input(s): CDIFFPCR in the last 72 hours.   Studies/Results: No results found.  Scheduled Inpatient Medications:   . sodium chloride  10 mL/hr Intravenous Once  . sodium chloride   Intravenous Once  . ALPRAZolam  0.5 mg Oral BID  . calcium acetate  1,334 mg Oral TID WC  . calcium carbonate  500 mg Oral BID WC  . cinacalcet  60 mg Oral Q breakfast  . docusate sodium  100 mg Oral BID  . ezetimibe  10 mg Oral Daily  . feeding supplement (PRO-STAT SUGAR FREE 64)  30 mL Oral BID  . furosemide  40 mg Oral Q T,Th,S,Su  . mometasone-formoterol  2 puff Inhalation BID  . multivitamin  1 tablet Oral QHS  . pantoprazole  40 mg Oral BID  . sertraline  50 mg Oral Daily  . sodium chloride flush  3 mL Intravenous Q12H    Continuous Inpatient Infusions:     PRN Inpatient Medications:  acetaminophen **OR** acetaminophen, albuterol, alum & mag hydroxide-simeth, bisacodyl, calcium acetate, fluticasone, lidocaine-prilocaine, ondansetron **OR** ondansetron (ZOFRAN) IV  Miscellaneous:   Assessment:  1. Melena, anemia, in the setting of dual antiplatelet therapy and history of end-stage renal disease/hemodialysis. Patient has been hemodynamically stable. Hemoglobin has been stable since transfusion.  Plan:  1. EGD tomorrow. I have discussed the risks benefits and complications of procedures to include not limited to bleeding, infection, perforation and the risk of sedation and the patient wishes to proceed.Dr. Vira Agar will be doing this procedure. Continue current medications  Lollie Sails MD 06/01/2016, 5:13 PM

## 2016-06-01 NOTE — Progress Notes (Signed)
picked up patient assignment from Orlinda Blalock, RN - [phone] 06/01/16

## 2016-06-01 NOTE — Care Management Important Message (Signed)
Important Message  Patient Details  Name: Michele Nash MRN: [id number] Date of Birth: 10-07-1956   Medicare Important Message Given:  Yes    [name] 06/01/2016, 11:15 AM

## 2016-06-01 NOTE — Progress Notes (Signed)
Post hd note 

## 2016-06-02 ENCOUNTER — Inpatient Hospital Stay: Payer: Medicare Other | Admitting: Anesthesiology

## 2016-06-02 ENCOUNTER — Encounter
Admission: EM | Disposition: A | Discharge status: Home / Self Care | Payer: Self-pay | Source: Home / Self Care | Attending: Internal Medicine

## 2016-06-02 ENCOUNTER — Encounter: Payer: Self-pay | Admitting: Anesthesiology

## 2016-06-02 HISTORY — PX: ESOPHAGOGASTRODUODENOSCOPY (EGD) WITH PROPOFOL: SHX5813

## 2016-06-02 LAB — HEPATITIS B SURFACE ANTIGEN: HEP B S AG: NEGATIVE

## 2016-06-02 LAB — HEMOGLOBIN: HEMOGLOBIN: 7.9 g/dL — AB (ref 12.0–16.0)

## 2016-06-02 SURGERY — EGD (ESOPHAGOGASTRODUODENOSCOPY)
Anesthesia: General

## 2016-06-02 SURGERY — ESOPHAGOGASTRODUODENOSCOPY (EGD) WITH PROPOFOL
Anesthesia: General

## 2016-06-02 MED ORDER — GLYCOPYRROLATE 0.2 MG/ML IJ SOLN
INTRAMUSCULAR | Status: DC | PRN
  Administered 2016-06-02: 0.2 mg via INTRAVENOUS

## 2016-06-02 MED ORDER — SODIUM CHLORIDE 0.9 % IV SOLN
INTRAVENOUS | Status: DC
Start: 2016-06-02 — End: 2016-06-02
  Administered 2016-06-02: 1000 mL via INTRAVENOUS

## 2016-06-02 MED ORDER — PANTOPRAZOLE SODIUM 40 MG PO TBEC: 40.0000 mg | DELAYED_RELEASE_TABLET | Freq: Two times a day (BID) | ORAL | Status: DC

## 2016-06-02 MED ORDER — LIDOCAINE 2% (20 MG/ML) 5 ML SYRINGE
INTRAMUSCULAR | Status: DC | PRN
  Administered 2016-06-02: 100 mg via INTRAVENOUS

## 2016-06-02 MED ORDER — PROPOFOL 500 MG/50ML IV EMUL
INTRAVENOUS | Status: DC | PRN
  Administered 2016-06-02: 200 ug/kg/min via INTRAVENOUS

## 2016-06-02 MED ORDER — PROPOFOL 10 MG/ML IV BOLUS
INTRAVENOUS | Status: DC | PRN
  Administered 2016-06-02: 20 mg via INTRAVENOUS
  Administered 2016-06-02: 50 mg via INTRAVENOUS

## 2016-06-02 NOTE — Discharge Instructions (Signed)
Renal diet. Activity as tolerated. Continue home O2 [place] 3 L.

## 2016-06-02 NOTE — Op Note (Signed)
[facility] Gastroenterology Patient Name: Michele Nash Procedure Date: 06/02/2016 7:48 AM MRN: [id number] Account #: [id number] Date of Birth: 24-Mar-1956 Admit Type: Inpatient Age: 60 Room: [facility] ENDO ROOM 4 Gender: Female Note Status: Finalized Procedure:            Upper GI endoscopy Indications:          Melena Providers:            Manya Silvas, MD Referring MD:         [name], MD (Referring MD) Medicines:            Propofol per Anesthesia Complications:        No immediate complications. Procedure:            Pre-Anesthesia Assessment:                       - After reviewing the risks and benefits, the patient                        was deemed in satisfactory condition to undergo the                        procedure.                       After obtaining informed consent, the endoscope was                        passed under direct vision. Throughout the procedure,                        the patient's blood pressure, pulse, and oxygen                        saturations were monitored continuously. The Endoscope                        was introduced through the mouth, and advanced to the                        second part of duodenum. The upper GI endoscopy was                        accomplished without difficulty. The patient tolerated                        the procedure well. Findings:      The examined esophagus was normal. GEJ 40cm.      A medium-sized hiatal hernia was present. No sign of ulcers or erosions       seen in the entire stomach. No gastritis.      A single tiny erosion without bleeding was found in the second portion       of the duodenum. Impression:           - Normal esophagus.                       - Medium-sized hiatal hernia.                       - Duodenal erosion without bleeding.                       -  No specimens collected. Recommendation:       - The findings and recommendations were discussed with             the referring physician. Manya Silvas, MD 06/02/2016 8:24:04 AM This report has been signed electronically. Number of Addenda: 0 Note Initiated On: 06/02/2016 7:48 AM      [facility]

## 2016-06-02 NOTE — Progress Notes (Signed)
Patient discharged to home. Concerns addressed. IV site removed  

## 2016-06-02 NOTE — Transfer of Care (Signed)
Immediate Anesthesia Transfer of Care Note  Patient: Michele Nash  Procedure(s) Performed: Procedure(s): ESOPHAGOGASTRODUODENOSCOPY (EGD) WITH PROPOFOL (N/A)  Patient Location: PACU  Anesthesia Type:General  Level of Consciousness: awake, alert  and oriented  Airway & Oxygen Therapy: Patient connected to nasal cannula oxygen  Post-op Assessment: Post -op Vital signs reviewed and stable  Post vital signs: stable  Last Vitals:  Filed Vitals:   06/02/16 0408 06/02/16 0823  BP: 151/59 106/74  Pulse: 71 90  Temp: 36.6 C 36 C  Resp: 20 23    Last Pain:  Filed Vitals:   06/02/16 0824  PainSc: Asleep      Patients Stated Pain Goal: 0 ([id number])  Complications: No apparent anesthesia complications

## 2016-06-02 NOTE — OR Nursing (Signed)
Patient had ice chips small amount tolerated them well

## 2016-06-02 NOTE — Discharge Summary (Signed)
[facility] [place] NAME: Michele Nash    MR#:  [id number]  DATE OF BIRTH:  06-Oct-1956  DATE OF ADMISSION:  05/28/2016 ADMITTING PHYSICIAN: Theodoro Grist, MD  DATE OF DISCHARGE: 06/02/2016 PRIMARY CARE PHYSICIAN: [name], MD    ADMISSION DIAGNOSIS:  Thoracic spine pain [M54.6] Weakness [R53.1] Anemia, unspecified anemia type [D64.9]   DISCHARGE DIAGNOSIS:  Symptomatic blood loss anemia GI bleed due to duodenal erosion SECONDARY DIAGNOSIS:   Past Medical History  Diagnosis Date  . Chronic kidney disease   . COPD (chronic obstructive pulmonary disease) ([place])   . Hypertension   . Hyperlipidemia   . Depression   . Dialysis patient ([place]) 2014  . Obesity   . Bronchitis   . Headache   . GERD (gastroesophageal reflux disease)   . Arthritis   . Anemia   . Renal dialysis device, implant, or graft complication     RIGHT CHEST CATH  . Apnea, sleep     for sleep study 10/17/15-no cpap yet  . Diabetes mellitus without complication ([place])     no meds    HOSPITAL COURSE:   1. Symptomatic blood loss anemia: s/p 3 unit of packed red blood cells thus far. Hb is stable for the past 4 days. 2. GI bleed due to duodenal erosion (possbile small healing ulcer). Plavix on hold. EGD today: duodenal erosion (possbile small healing ulcer) per Dr. Tiffany Kocher. Continue protonix bid po. 3. End-stage renal disease on dialysis. Dialysis today. 4. Hypotension: resolved. Resumed coreg as BP well controlled 5. Back pain thoracic spine x-ray negative 6. Chronic respiratory failure and COPD. Currently on oxygen 3 L at home. NEB prn.  DISCHARGE CONDITIONS:   Stable, discharge to home today.  CONSULTS OBTAINED:  Treatment Team:  Anthonette Legato, MD  DRUG ALLERGIES:   Allergies  Allergen Reactions  . Sulfa Antibiotics Swelling, Rash and Other (See Comments)    Reaction:  Facial/body swelling     DISCHARGE MEDICATIONS:   Current Discharge  Medication List    START taking these medications   Details  pantoprazole (PROTONIX) 40 MG tablet Take 1 tablet (40 mg total) by mouth 2 (two) times daily. Qty: 60 tablet, Refills: 0      CONTINUE these medications which have NOT CHANGED   Details  albuterol (PROVENTIL HFA;VENTOLIN HFA) 108 (90 BASE) MCG/ACT inhaler Inhale 2 puffs into the lungs every 6 (six) hours as needed for wheezing or shortness of breath.    ALPRAZolam (XANAX) 0.5 MG tablet Take 0.5 mg by mouth 2 (two) times daily.    amLODipine (NORVASC) 10 MG tablet Take 10 mg by mouth at bedtime.    calcium acetate (PHOSLO) 667 MG capsule Take 667-1,334 mg by mouth See admin instructions. Pt takes two capsules three times daily with meals and one capsule two times daily with snacks.    calcium carbonate (OS-CAL) 600 MG TABS tablet Take 600 mg by mouth 2 (two) times daily with a meal.    carvedilol (COREG) 25 MG tablet Take 1 tablet (25 mg total) by mouth 2 (two) times daily with a meal. Qty: 60 tablet, Refills: 0    cinacalcet (SENSIPAR) 30 MG tablet Take 60 mg by mouth daily with breakfast.     ezetimibe (ZETIA) 10 MG tablet Take 10 mg by mouth daily.     fluticasone (FLONASE) 50 MCG/ACT nasal spray Place 2 sprays into both nostrils daily as needed for rhinitis.    Fluticasone-Salmeterol (ADVAIR)  250-50 MCG/DOSE AEPB Inhale 1 puff into the lungs 2 (two) times daily.    furosemide (LASIX) 40 MG tablet Take 40 mg by mouth every Tuesday, Thursday, Saturday, and Sunday.     lidocaine-prilocaine (EMLA) cream Apply 1 application topically as needed (prior to accessing port).    losartan (COZAAR) 100 MG tablet Take 100 mg by mouth every Tuesday, Thursday, Saturday, and Sunday.     multivitamin (RENA-VIT) TABS tablet Take 1 tablet by mouth at bedtime.     sertraline (ZOLOFT) 50 MG tablet Take 50 mg by mouth daily.       STOP taking these medications     clopidogrel (PLAVIX) 75 MG tablet      omeprazole (PRILOSEC) 20 MG  capsule          DISCHARGE INSTRUCTIONS:    If you experience worsening of your admission symptoms, develop shortness of breath, life threatening emergency, suicidal or homicidal thoughts you must seek medical attention immediately by calling 911 or calling your MD immediately  if symptoms less severe.  You Must read complete instructions/literature along with all the possible adverse reactions/side effects for all the Medicines you take and that have been prescribed to you. Take any new Medicines after you have completely understood and accept all the possible adverse reactions/side effects.   Please note  You were cared for by a hospitalist during your hospital stay. If you have any questions about your discharge medications or the care you received while you were in the hospital after you are discharged, you can call the unit and asked to speak with the hospitalist on call if the hospitalist that took care of you is not available. Once you are discharged, your primary care physician will handle any further medical issues. Please note that NO REFILLS for any discharge medications will be authorized once you are discharged, as it is imperative that you return to your primary care physician (or establish a relationship with a primary care physician if you do not have one) for your aftercare needs so that they can reassess your need for medications and monitor your lab values.    Today   SUBJECTIVE    No complaint.  VITAL SIGNS:  Blood pressure 146/97, pulse 91, temperature 98.3 F (36.8 C), temperature source Temporal, resp. rate 20, height 5\' 5" (1.651 m), weight 124 lb 5.4 oz (56.4 kg), SpO2 100 %.  I/O:   Intake/Output Summary (Last 24 hours) at 06/02/16 1102 Last data filed at 06/02/16 0841  Gross per 24 hour  Intake     30  ml  Output   1650 ml  Net  -1620 ml    PHYSICAL EXAMINATION:  GENERAL:  60 y.o.-year-old patient lying in the bed with no acute distress.  EYES:  Pupils equal, round, reactive to light and accommodation. No scleral icterus. Extraocular muscles intact.  HEENT: Head atraumatic, normocephalic. Oropharynx and nasopharynx clear.  NECK:  Supple, no jugular venous distention. No thyroid enlargement, no tenderness.  LUNGS: Normal breath sounds bilaterally, no wheezing, rales,rhonchi or crepitation. No use of accessory muscles of respiration.  CARDIOVASCULAR: S1, S2 normal. No murmurs, rubs, or gallops.  ABDOMEN: Soft, non-tender, non-distended. Bowel sounds present. No organomegaly or mass.  EXTREMITIES: No pedal edema, cyanosis, or clubbing.  NEUROLOGIC: Cranial nerves II through XII are intact. Muscle strength 5/5 in all extremities. Sensation intact. Gait not checked.  PSYCHIATRIC: The patient is alert and oriented x 3.  SKIN: No obvious rash, lesion, or ulcer.   DATA  REVIEW:   CBC  Recent Labs Lab 05/31/16 0438 06/02/16 0406  WBC 9.2  --   HGB 8.4* 7.9*  HCT 24.5*  --   PLT 276  --     Chemistries   Recent Labs Lab 05/28/16 1122  05/31/16 0438  NA 135  < > 139  K 4.7  < > 3.6  CL 97*  < > 99*  CO2 23  < > 34*  GLUCOSE 203*  < > 120*  BUN 80*  < > 19  CREATININE 11.31*  < > 4.35*  CALCIUM 7.0*  < > 7.5*  AST 23  --   --   ALT 15  --   --   ALKPHOS 82  --   --   BILITOT 0.4  --   --   < > = values in this interval not displayed.  Cardiac Enzymes  Recent Labs Lab 05/28/16 1122  TROPONINI <0.03    Microbiology Results  Results for orders placed or performed during the hospital encounter of 05/28/16  MRSA PCR Screening     Status: None   Collection Time: 05/30/16 12:09 PM  Result Value Ref Range Status   MRSA by PCR NEGATIVE NEGATIVE Final    Comment:        The GeneXpert MRSA Assay (FDA approved for NASAL specimens only), is one component of a comprehensive MRSA colonization surveillance program. It is not intended to diagnose MRSA infection nor to guide or monitor treatment for MRSA infections.      RADIOLOGY:  No results found.      Management plans discussed with the patient, family and they are in agreement.  CODE STATUS:     Code Status Orders        Start     Ordered   05/28/16 1455  Full code   Continuous     05/28/16 1454    Code Status History    Date Active Date Inactive Code Status Order ID Comments User Context   04/25/2016  6:04 PM 04/26/2016  9:17 PM Full Code [id number]  Nicholes Mango, MD Inpatient   10/28/2015  6:50 PM 10/30/2015  6:09 PM Full Code [id number]  Theodoro Grist, MD Inpatient   03/30/2015 12:51 AM 03/31/2015  4:44 PM Full Code [id number]  Juluis Mire, MD Inpatient    Advance Directive Documentation        Most Recent Value   Type of Advance Directive  Living will   Pre-existing out of facility DNR order (yellow form or pink MOST form)     "MOST" Form in Place?        TOTAL TIME TAKING CARE OF THIS PATIENT:33 minutes.    Demetrios Loll M.D on 06/02/2016 at 11:02 AM  Between 7am to 6pm - Pager - [phone]  After 6pm go to [email or web address] - password [email or web address] [facility]  Office  [phone]  CC: Primary care physician; [name], MD

## 2016-06-02 NOTE — Progress Notes (Signed)
[facility]  ROUNDING NOTE   Subjective:  Patient was found have small duodenal ulcer on endoscopy. Overall she's feeling well at the moment. She had hemodialysis yesterday.   Objective:  Vital signs in last 24 hours:  Temp:  [96.8 F (36 C)-98.8 F (37.1 C)] 98.3 F (36.8 C) (07/08 0843) Pulse Rate:  [65-96] 91 (07/08 0844) Resp:  [16-23] 20 (07/08 0844) BP: (106-163)/(59-117) 146/97 mmHg (07/08 0843) SpO2:  [96 %-100 %] 100 % (07/08 0844)  Weight change:  Filed Weights   05/29/16 1033 05/29/16 1414 05/30/16 1345  Weight: 55.6 kg (122 lb 9.2 oz) 54.1 kg (119 lb 4.3 oz) 56.4 kg (124 lb 5.4 oz)    Intake/Output: I/O last 3 completed shifts: In: -  Out: 1700 [Urine:200; Other:1500]   Intake/Output this shift:  Total I/O In: 30 [I.V.:30] Out: -   Physical Exam: General: NAD, resting in bed  Head: Normocephalic, atraumatic. Moist oral mucosal membranes  Eyes: Anicteric  Neck: Supple, trachea midline  Lungs:  Clear to auscultation, normal effort  Heart: S1S2 no rubs  Abdomen:  Soft, nontender, BS present   Extremities: No peripheral edema.  Neurologic: Nonfocal, moving all four extremities  Skin: No lesions  Access: LUE AVF    Basic Metabolic Panel:  Recent Labs Lab 05/28/16 1122 05/28/16 1658 05/29/16 0445 05/30/16 0423 05/30/16 1355 05/31/16 0438 06/01/16 1237  NA 135  --  139 139  --  139  --   K 4.7  --  3.6 3.6  --  3.6  --   CL 97*  --  99* 99*  --  99*  --   CO2 23  --  34* 34*  --  34*  --   GLUCOSE 203*  --  100* 101*  --  120*  --   BUN 80*  --  27* 20  --  19  --   CREATININE 11.31*  --  6.27* 4.50*  --  4.35*  --   CALCIUM 7.0*  --  7.6* 7.6*  --  7.5*  --   PHOS  --  6.4* 4.0  --  4.7*  --  4.0    Liver Function Tests:  Recent Labs Lab 05/28/16 1122  AST 23  ALT 15  ALKPHOS 82  BILITOT 0.4  PROT 5.7*  ALBUMIN 3.2*   No results for input(s): LIPASE, AMYLASE in the last 168 hours. No results for input(s): AMMONIA  in the last 168 hours.  CBC:  Recent Labs Lab 05/28/16 1122 05/28/16 1800 05/29/16 0445 05/29/16 1714 05/30/16 0423 05/31/16 0438 06/02/16 0406  WBC 9.8  --  8.9  --  10.7 9.2  --   NEUTROABS 8.0*  --   --   --   --   --   --   HGB 5.0* 5.9* 5.8* 8.8* 8.4* 8.4* 7.9*  HCT 14.9* 17.3* 16.6* 24.9* 24.3* 24.5*  --   MCV 101.9*  --  93.3  --  92.2 93.3  --   PLT 248  --  250  --  264 276  --     Cardiac Enzymes:  Recent Labs Lab 05/28/16 1122  TROPONINI <0.03    BNP: Invalid input(s): POCBNP  CBG: No results for input(s): GLUCAP in the last 168 hours.  Microbiology: Results for orders placed or performed during the hospital encounter of 05/28/16  MRSA PCR Screening     Status: None   Collection Time: 05/30/16 12:09 PM  Result Value Ref  Range Status   MRSA by PCR NEGATIVE NEGATIVE Final    Comment:        The GeneXpert MRSA Assay (FDA approved for NASAL specimens only), is one component of a comprehensive MRSA colonization surveillance program. It is not intended to diagnose MRSA infection nor to guide or monitor treatment for MRSA infections.     Coagulation Studies: No results for input(s): LABPROT, INR in the last 72 hours.  Urinalysis: No results for input(s): COLORURINE, LABSPEC, PHURINE, GLUCOSEU, HGBUR, BILIRUBINUR, KETONESUR, PROTEINUR, UROBILINOGEN, NITRITE, LEUKOCYTESUR in the last 72 hours.  Invalid input(s): APPERANCEUR    Imaging: No results found.   Medications:     . sodium chloride  10 mL/hr Intravenous Once  . sodium chloride   Intravenous Once  . ALPRAZolam  0.5 mg Oral BID  . calcium acetate  1,334 mg Oral TID WC  . calcium carbonate  500 mg Oral BID WC  . cinacalcet  60 mg Oral Q breakfast  . docusate sodium  100 mg Oral BID  . ezetimibe  10 mg Oral Daily  . feeding supplement (PRO-STAT SUGAR FREE 64)  30 mL Oral BID  . furosemide  40 mg Oral Q T,Th,S,Su  . mometasone-formoterol  2 puff Inhalation BID  . multivitamin  1  tablet Oral QHS  . pantoprazole  40 mg Oral BID  . sertraline  50 mg Oral Daily  . sodium chloride flush  3 mL Intravenous Q12H   acetaminophen **OR** acetaminophen, albuterol, alum & mag hydroxide-simeth, bisacodyl, calcium acetate, fluticasone, lidocaine-prilocaine, ondansetron **OR** ondansetron (ZOFRAN) IV  Assessment/ Plan:  60 y.o. female with pmhx of HTN, hyperlipidemia, diastolic heart failure, hypertension, COPD, tobacco abuse, AOCD, SHPTH, LUE AVF, ESRD 02/2013, admission for severe anemia/black stools 05/28/16.  [facility] [place]/MWF  1. End-stage renal disease on hemodialysis MWF. Patient completed dialysis yesterday. No acute indication for dialysis today. She will resume her normal outpatient schedule.  2. Anemia of chronic kidney disease/GI bleed. Patient having black tarry stools at home.  - Hemoglobin slightly down to 7.9, Small duodenal ulcer noted.  Patient status post blood transfusion this admission. Restart Epogen as an outpatient.  3. Secondary hyperparathyroidism. Phosphorus down to 4.0 at last check.   Continue calcium acetate as well as Sensipar 60 mg by mouth daily.    LOS: 5 Kiylee Thoreson 7/8/201711:19 AM

## 2016-06-02 NOTE — Consult Note (Signed)
Patient EGD showed in the second portion of the duodenum a tiny erosion on a small prominence, likely healing small ulcer.  No blood, no clot, very likely to continue to heal.  No interventions done.  Will resume renal diet.

## 2016-06-02 NOTE — Anesthesia Postprocedure Evaluation (Signed)
Anesthesia Post Note  Patient: Michele Nash  Procedure(s) Performed: Procedure(s) (LRB): ESOPHAGOGASTRODUODENOSCOPY (EGD) WITH PROPOFOL (N/A)  Patient location during evaluation: PACU Anesthesia Type: General Level of consciousness: awake and alert and oriented Pain management: pain level controlled Vital Signs Assessment: post-procedure vital signs reviewed and stable Respiratory status: spontaneous breathing Cardiovascular status: blood pressure returned to baseline Anesthetic complications: no    Last Vitals:  Filed Vitals:   06/02/16 0843 06/02/16 0844  BP: 146/97   Pulse: 96 91  Temp: 36.8 C   Resp: 20 20    Last Pain:  Filed Vitals:   06/02/16 0845  PainSc: 0-No pain                 Navdeep Halt

## 2016-06-02 NOTE — Anesthesia Preprocedure Evaluation (Addendum)
Anesthesia Evaluation  Patient identified by MRN, date of birth, ID band Patient awake    Reviewed: Allergy & Precautions, H&P , NPO status , Patient's Chart, lab work & pertinent test results, reviewed documented beta blocker date and time   History of Anesthesia Complications Negative for: history of anesthetic complications  Airway Mallampati: III  TM Distance: >3 FB Neck ROM: limited    Dental  (+) Poor Dentition, Chipped, Missing, Upper Dentures, Lower Dentures   Pulmonary shortness of breath, sleep apnea , COPD,  COPD inhaler, former smoker,    Pulmonary exam normal breath sounds clear to auscultation       Cardiovascular Exercise Tolerance: Poor hypertension, Pt. on medications and Pt. on home beta blockers (-) angina+ DOE  (-) Past MI Normal cardiovascular exam Rhythm:regular Rate:Normal     Neuro/Psych  Headaches, PSYCHIATRIC DISORDERS Depression    GI/Hepatic negative GI ROS, Neg liver ROS, GERD  Medicated and Controlled,  Endo/Other  negative endocrine ROSdiabetes, Well Controlled, Type 2, Oral Hypoglycemic Agents  Renal/GU ESRFRenal disease  negative genitourinary   Musculoskeletal  (+) Arthritis , Osteoarthritis,    Abdominal   Peds negative pediatric ROS (+)  Hematology negative hematology ROS (+) anemia ,   Anesthesia Other Findings Past Medical History:   Chronic kidney disease                                       COPD (chronic obstructive pulmonary disease) (*              Hypertension                                                 Hyperlipidemia                                               Depression                                                   Dialysis patient ([place])                          2014         Obesity                                                      Bronchitis                                                   Headache  Past Surgical History:   ABDOMINAL HYSTERECTOMY                                        DIALYSIS FISTULA CREATION                                     COLONOSCOPY                                      2011         PERIPHERAL VASCULAR CATHETERIZATION             N/A 05/10/2015      Comment:Procedure: A/V Shuntogram/Fistulagram;                Surgeon: Katha Cabal, MD;  Location: [facility];  Service: Cardiovascular;                Laterality: N/A;   PERIPHERAL VASCULAR CATHETERIZATION             Left 05/10/2015      Comment:Procedure: A/V Shunt Intervention;  Surgeon:               Katha Cabal, MD;  Location: [facility]              [facility];  Service: Cardiovascular;  Laterality:               Left;   PERIPHERAL VASCULAR CATHETERIZATION             Left 08/23/2015      Comment:Procedure: A/V Shuntogram/Fistulagram;                Surgeon: Katha Cabal, MD;  Location: [place] [facility];  Service: Cardiovascular;                Laterality: Left;   PERIPHERAL VASCULAR CATHETERIZATION             N/A 08/23/2015      Comment:Procedure: A/V Shunt Intervention;  Surgeon:               Katha Cabal, MD;  Location: [place]              [facility];  Service: Cardiovascular;  Laterality:               N/A;   PERIPHERAL VASCULAR CATHETERIZATION              08/23/2015      Comment:Procedure: Dialysis/Perma Catheter Insertion;                Surgeon: Katha Cabal, MD;  Location: [facility];  Service: Cardiovascular;;  BMI    Body Mass Index   24.12 kg/m 2      Reproductive/Obstetrics negative OB ROS                            Anesthesia Physical  Anesthesia Plan  ASA: III and emergent  Anesthesia Plan: General   Post-op Pain Management:    Induction: Intravenous  Airway Management Planned: Nasal Cannula  Additional Equipment:   Intra-op Plan:   Post-operative Plan:    Informed Consent: I have reviewed the patients History and Physical, chart, labs and discussed the procedure including the risks, benefits and alternatives for the proposed anesthesia with the patient or authorized representative who has indicated his/her understanding and acceptance.   Dental Advisory Given  Plan Discussed with: Anesthesiologist, CRNA and Surgeon  Anesthesia Plan Comments:        Anesthesia Quick Evaluation

## 2016-06-03 ENCOUNTER — Encounter: Payer: Self-pay | Admitting: Unknown Physician Specialty

## 2016-06-03 NOTE — Addendum Note (Signed)
Addendum  created 06/03/16 0849 by Alvin Critchley, MD   Modules edited: Clinical Notes   Clinical Notes:  File: [id number]

## 2016-06-19 ENCOUNTER — Other Ambulatory Visit: Payer: Self-pay | Admitting: Cardiology

## 2016-06-19 DIAGNOSIS — E049 Nontoxic goiter, unspecified: Secondary | ICD-10-CM

## 2016-06-22 ENCOUNTER — Ambulatory Visit
Admission: RE | Admit: 2016-06-22 | Discharge: 2016-06-22 | Disposition: A | Discharge status: Home / Self Care | Payer: Medicare Other | Source: Ambulatory Visit | Attending: Internal Medicine | Admitting: Internal Medicine

## 2016-06-22 DIAGNOSIS — Z992 Dependence on renal dialysis: Secondary | ICD-10-CM | POA: Insufficient documentation

## 2016-06-22 DIAGNOSIS — D631 Anemia in chronic kidney disease: Secondary | ICD-10-CM | POA: Insufficient documentation

## 2016-06-22 DIAGNOSIS — N186 End stage renal disease: Secondary | ICD-10-CM | POA: Diagnosis not present

## 2016-06-22 LAB — HEMOGLOBIN AND HEMATOCRIT, BLOOD
HEMATOCRIT: 18.1 % — AB (ref 35.0–47.0)
Hemoglobin: 5.8 g/dL — ABNORMAL LOW (ref 12.0–16.0)

## 2016-06-22 LAB — PREPARE RBC (CROSSMATCH)

## 2016-06-22 MED ORDER — SODIUM CHLORIDE FLUSH 0.9 % IV SOLN
INTRAVENOUS | Status: AC
  Filled 2016-06-22: qty 10

## 2016-06-22 MED ORDER — SODIUM CHLORIDE 0.9 % IV SOLN
Freq: Once | INTRAVENOUS | Status: AC
  Administered 2016-06-22: 09:00:00 via INTRAVENOUS

## 2016-06-22 MED ORDER — DIPHENHYDRAMINE HCL 25 MG PO CAPS
ORAL_CAPSULE | ORAL | Status: AC
  Filled 2016-06-22: qty 2

## 2016-06-22 MED ORDER — DIPHENHYDRAMINE HCL 25 MG PO CAPS
50.0000 mg | ORAL_CAPSULE | Freq: Once | ORAL | Status: AC
  Administered 2016-06-22: 50 mg via ORAL

## 2016-06-22 NOTE — Procedures (Signed)
Lab tech in for T&S/H&H draw @ [phone]

## 2016-06-22 NOTE — OR Nursing (Signed)
Pt advises "i'm itching some, can I have some benadryl?", approx 35ml left to transfuse, transfusion stopped, IV site flushed w/NS and IV d/c'd.  Notified blood bank [facility] re same.  Called Dr. Candiss Norse [phone] re same.  Pt given Benadryl 50mg  po given as ordered by Dr. Candiss Norse.

## 2016-06-22 NOTE — OR Nursing (Signed)
Addendum to 2:58 pm note, per My[facility], Blood Bank, do not need to send blood bag to them.

## 2016-06-23 LAB — TYPE AND SCREEN
ABO/RH(D): O POS
Antibody Screen: NEGATIVE
UNIT DIVISION: 0
Unit division: 0

## 2016-06-26 ENCOUNTER — Ambulatory Visit: Payer: Medicare Other

## 2016-07-09 ENCOUNTER — Inpatient Hospital Stay
Admission: EM | Admit: 2016-07-09 | Discharge: 2016-07-13 | DRG: 377 | Disposition: A | Discharge status: Home / Self Care | Payer: Medicare Other | Attending: Specialist | Admitting: Specialist

## 2016-07-09 DIAGNOSIS — D649 Anemia, unspecified: Secondary | ICD-10-CM

## 2016-07-09 DIAGNOSIS — G92 Toxic encephalopathy: Secondary | ICD-10-CM | POA: Diagnosis present

## 2016-07-09 DIAGNOSIS — G4733 Obstructive sleep apnea (adult) (pediatric): Secondary | ICD-10-CM | POA: Diagnosis present

## 2016-07-09 DIAGNOSIS — F329 Major depressive disorder, single episode, unspecified: Secondary | ICD-10-CM | POA: Diagnosis present

## 2016-07-09 DIAGNOSIS — Z8711 Personal history of peptic ulcer disease: Secondary | ICD-10-CM

## 2016-07-09 DIAGNOSIS — D631 Anemia in chronic kidney disease: Secondary | ICD-10-CM | POA: Diagnosis present

## 2016-07-09 DIAGNOSIS — K921 Melena: Principal | ICD-10-CM | POA: Diagnosis present

## 2016-07-09 DIAGNOSIS — K259 Gastric ulcer, unspecified as acute or chronic, without hemorrhage or perforation: Secondary | ICD-10-CM | POA: Diagnosis present

## 2016-07-09 DIAGNOSIS — I5032 Chronic diastolic (congestive) heart failure: Secondary | ICD-10-CM | POA: Diagnosis present

## 2016-07-09 DIAGNOSIS — F419 Anxiety disorder, unspecified: Secondary | ICD-10-CM | POA: Diagnosis present

## 2016-07-09 DIAGNOSIS — Z809 Family history of malignant neoplasm, unspecified: Secondary | ICD-10-CM

## 2016-07-09 DIAGNOSIS — J449 Chronic obstructive pulmonary disease, unspecified: Secondary | ICD-10-CM | POA: Diagnosis present

## 2016-07-09 DIAGNOSIS — K922 Gastrointestinal hemorrhage, unspecified: Secondary | ICD-10-CM

## 2016-07-09 DIAGNOSIS — D62 Acute posthemorrhagic anemia: Secondary | ICD-10-CM | POA: Diagnosis present

## 2016-07-09 DIAGNOSIS — K219 Gastro-esophageal reflux disease without esophagitis: Secondary | ICD-10-CM | POA: Diagnosis present

## 2016-07-09 DIAGNOSIS — E785 Hyperlipidemia, unspecified: Secondary | ICD-10-CM | POA: Diagnosis present

## 2016-07-09 DIAGNOSIS — K2970 Gastritis, unspecified, without bleeding: Secondary | ICD-10-CM | POA: Diagnosis present

## 2016-07-09 DIAGNOSIS — Z87891 Personal history of nicotine dependence: Secondary | ICD-10-CM | POA: Diagnosis not present

## 2016-07-09 DIAGNOSIS — Z992 Dependence on renal dialysis: Secondary | ICD-10-CM

## 2016-07-09 DIAGNOSIS — T402X5A Adverse effect of other opioids, initial encounter: Secondary | ICD-10-CM | POA: Diagnosis not present

## 2016-07-09 DIAGNOSIS — E1122 Type 2 diabetes mellitus with diabetic chronic kidney disease: Secondary | ICD-10-CM | POA: Diagnosis present

## 2016-07-09 DIAGNOSIS — Z8249 Family history of ischemic heart disease and other diseases of the circulatory system: Secondary | ICD-10-CM | POA: Diagnosis not present

## 2016-07-09 DIAGNOSIS — N2581 Secondary hyperparathyroidism of renal origin: Secondary | ICD-10-CM | POA: Diagnosis present

## 2016-07-09 DIAGNOSIS — B3781 Candidal esophagitis: Secondary | ICD-10-CM | POA: Diagnosis present

## 2016-07-09 DIAGNOSIS — I132 Hypertensive heart and chronic kidney disease with heart failure and with stage 5 chronic kidney disease, or end stage renal disease: Secondary | ICD-10-CM | POA: Diagnosis present

## 2016-07-09 DIAGNOSIS — N186 End stage renal disease: Secondary | ICD-10-CM | POA: Diagnosis present

## 2016-07-09 DIAGNOSIS — Y92239 Unspecified place in hospital as the place of occurrence of the external cause: Secondary | ICD-10-CM | POA: Diagnosis not present

## 2016-07-09 HISTORY — DX: Gastrointestinal hemorrhage, unspecified: K92.2

## 2016-07-09 LAB — COMPREHENSIVE METABOLIC PANEL
ALK PHOS: 128 U/L — AB (ref 38–126)
ALT: 18 U/L (ref 14–54)
ANION GAP: 10 (ref 5–15)
AST: 28 U/L (ref 15–41)
Albumin: 3.3 g/dL — ABNORMAL LOW (ref 3.5–5.0)
BILIRUBIN TOTAL: 0.4 mg/dL (ref 0.3–1.2)
BUN: 19 mg/dL (ref 6–20)
CALCIUM: 7.3 mg/dL — AB (ref 8.9–10.3)
CO2: 33 mmol/L — AB (ref 22–32)
Chloride: 96 mmol/L — ABNORMAL LOW (ref 101–111)
Creatinine, Ser: 4.16 mg/dL — ABNORMAL HIGH (ref 0.44–1.00)
GFR calc non Af Amer: 11 mL/min — ABNORMAL LOW (ref >60)
GFR, EST AFRICAN AMERICAN: 12 mL/min — AB (ref >60)
Glucose, Bld: 109 mg/dL — ABNORMAL HIGH (ref 65–99)
Potassium: 3.1 mmol/L — ABNORMAL LOW (ref 3.5–5.1)
SODIUM: 139 mmol/L (ref 135–145)
TOTAL PROTEIN: 6.4 g/dL — AB (ref 6.5–8.1)

## 2016-07-09 LAB — CBC
HCT: 19.3 % — ABNORMAL LOW (ref 35.0–47.0)
HEMOGLOBIN: 6.4 g/dL — AB (ref 12.0–16.0)
MCH: 33.5 pg (ref 26.0–34.0)
MCHC: 33.2 g/dL (ref 32.0–36.0)
MCV: 100.8 fL — ABNORMAL HIGH (ref 80.0–100.0)
Platelets: 276 10*3/uL (ref 150–440)
RBC: 1.91 MIL/uL — ABNORMAL LOW (ref 3.80–5.20)
RDW: 18.2 % — ABNORMAL HIGH (ref 11.5–14.5)
WBC: 9.6 10*3/uL (ref 3.6–11.0)

## 2016-07-09 LAB — TROPONIN I: TROPONIN I: 0.05 ng/mL — AB (ref <0.03)

## 2016-07-09 LAB — PREPARE RBC (CROSSMATCH)

## 2016-07-09 MED ORDER — CALCIUM CARBONATE ANTACID 500 MG PO CHEW
500.0000 mg | CHEWABLE_TABLET | Freq: Two times a day (BID) | ORAL | Status: DC
  Administered 2016-07-10 – 2016-07-12 (×5): 500 mg via ORAL
  Filled 2016-07-09 (×5): qty 1

## 2016-07-09 MED ORDER — ACETAMINOPHEN 325 MG PO TABS
650.0000 mg | ORAL_TABLET | Freq: Four times a day (QID) | ORAL | Status: DC | PRN
  Administered 2016-07-11 – 2016-07-12 (×3): 650 mg via ORAL
  Filled 2016-07-09 (×3): qty 2

## 2016-07-09 MED ORDER — LOSARTAN POTASSIUM 50 MG PO TABS
100.0000 mg | ORAL_TABLET | ORAL | Status: DC
  Administered 2016-07-10 – 2016-07-12 (×2): 100 mg via ORAL
  Filled 2016-07-09 (×2): qty 2

## 2016-07-09 MED ORDER — CARVEDILOL 25 MG PO TABS
25.0000 mg | ORAL_TABLET | Freq: Two times a day (BID) | ORAL | Status: DC
Start: 2016-07-09 — End: 2016-07-13
  Administered 2016-07-09 – 2016-07-13 (×8): 25 mg via ORAL
  Filled 2016-07-09 (×8): qty 1

## 2016-07-09 MED ORDER — ACETAMINOPHEN 650 MG RE SUPP: 650.0000 mg | Freq: Four times a day (QID) | RECTAL | Status: DC | PRN

## 2016-07-09 MED ORDER — RENA-VITE PO TABS
1.0000 | ORAL_TABLET | Freq: Every day | ORAL | Status: DC
  Administered 2016-07-09 – 2016-07-12 (×5): 1 via ORAL
  Filled 2016-07-09 (×5): qty 1

## 2016-07-09 MED ORDER — OXYCODONE HCL 5 MG PO TABS
5.0000 mg | ORAL_TABLET | ORAL | Status: DC | PRN
  Administered 2016-07-10: 11:00:00 5 mg via ORAL
  Filled 2016-07-09: qty 1

## 2016-07-09 MED ORDER — FLUTICASONE PROPIONATE 50 MCG/ACT NA SUSP
2.0000 | Freq: Every day | NASAL | Status: DC | PRN
  Filled 2016-07-09: qty 16

## 2016-07-09 MED ORDER — EZETIMIBE 10 MG PO TABS
10.0000 mg | ORAL_TABLET | Freq: Every day | ORAL | Status: DC
  Administered 2016-07-10 – 2016-07-13 (×3): 10 mg via ORAL
  Filled 2016-07-09 (×3): qty 1

## 2016-07-09 MED ORDER — SODIUM CHLORIDE 0.9 % IV SOLN
8.0000 mg/h | INTRAVENOUS | Status: DC
  Administered 2016-07-10 – 2016-07-12 (×5): 8 mg/h via INTRAVENOUS
  Filled 2016-07-09 (×5): qty 80

## 2016-07-09 MED ORDER — AMLODIPINE BESYLATE 10 MG PO TABS
10.0000 mg | ORAL_TABLET | Freq: Every day | ORAL | Status: DC
  Administered 2016-07-10 – 2016-07-12 (×3): 10 mg via ORAL
  Filled 2016-07-09 (×3): qty 1

## 2016-07-09 MED ORDER — FUROSEMIDE 40 MG PO TABS
40.0000 mg | ORAL_TABLET | ORAL | Status: DC
  Administered 2016-07-10 – 2016-07-12 (×2): 40 mg via ORAL
  Filled 2016-07-09 (×2): qty 1

## 2016-07-09 MED ORDER — CALCIUM ACETATE (PHOS BINDER) 667 MG PO CAPS
667.0000 mg | ORAL_CAPSULE | Freq: Two times a day (BID) | ORAL | Status: DC
  Administered 2016-07-10 – 2016-07-13 (×4): 667 mg via ORAL
  Filled 2016-07-09 (×5): qty 1

## 2016-07-09 MED ORDER — SODIUM CHLORIDE 0.9 % IV SOLN
80.0000 mg | Freq: Once | INTRAVENOUS | Status: AC
  Administered 2016-07-09: 80 mg via INTRAVENOUS
  Filled 2016-07-09: qty 80

## 2016-07-09 MED ORDER — MOMETASONE FURO-FORMOTEROL FUM 200-5 MCG/ACT IN AERO
2.0000 | INHALATION_SPRAY | Freq: Two times a day (BID) | RESPIRATORY_TRACT | Status: DC
  Administered 2016-07-09 – 2016-07-12 (×6): 2 via RESPIRATORY_TRACT
  Filled 2016-07-09: qty 8.8

## 2016-07-09 MED ORDER — ALPRAZOLAM 0.5 MG PO TABS
0.5000 mg | ORAL_TABLET | Freq: Two times a day (BID) | ORAL | Status: DC
  Administered 2016-07-09 – 2016-07-13 (×8): 0.5 mg via ORAL
  Filled 2016-07-09 (×8): qty 1

## 2016-07-09 MED ORDER — CALCIUM ACETATE 667 MG PO CAPS
1334.0000 mg | ORAL_CAPSULE | Freq: Three times a day (TID) | ORAL | Status: DC
  Administered 2016-07-10 – 2016-07-12 (×6): 1334 mg via ORAL
  Filled 2016-07-09 (×17): qty 2

## 2016-07-09 MED ORDER — ONDANSETRON HCL 4 MG/2ML IJ SOLN
4.0000 mg | Freq: Four times a day (QID) | INTRAMUSCULAR | Status: DC | PRN
Start: 2016-07-09 — End: 2016-07-13

## 2016-07-09 MED ORDER — ALBUTEROL SULFATE (2.5 MG/3ML) 0.083% IN NEBU: 2.5000 mg | INHALATION_SOLUTION | Freq: Four times a day (QID) | RESPIRATORY_TRACT | Status: DC | PRN

## 2016-07-09 MED ORDER — SODIUM CHLORIDE 0.9 % IV SOLN
10.0000 mL/h | Freq: Once | INTRAVENOUS | Status: AC
  Administered 2016-07-09: 21:00:00 10 mL/h via INTRAVENOUS

## 2016-07-09 MED ORDER — CINACALCET HCL 30 MG PO TABS
60.0000 mg | ORAL_TABLET | Freq: Every day | ORAL | Status: DC
Start: 2016-07-10 — End: 2016-07-13
  Administered 2016-07-10: 10:00:00 60 mg via ORAL
  Filled 2016-07-09 (×2): qty 2

## 2016-07-09 MED ORDER — ONDANSETRON HCL 4 MG PO TABS: 4.0000 mg | ORAL_TABLET | Freq: Four times a day (QID) | ORAL | Status: DC | PRN

## 2016-07-09 MED ORDER — SERTRALINE HCL 50 MG PO TABS
50.0000 mg | ORAL_TABLET | Freq: Every day | ORAL | Status: DC
  Administered 2016-07-10 – 2016-07-13 (×4): 50 mg via ORAL
  Filled 2016-07-09 (×4): qty 1

## 2016-07-09 NOTE — ED Notes (Signed)
Pt talking to husband on phone. Pt sitting up straight on stretcher with personal blanket covering pt. Food tray at bedside.

## 2016-07-09 NOTE — ED Provider Notes (Addendum)
[facility] Emergency Department Provider Note  Time seen: 4:53 PM  I have reviewed the triage vital signs and the nursing notes.   HISTORY  Chief Complaint Abnormal Lab    HPI Michele Nash is a 60 y.o. female with a past medical history of anemia, COPD, diabetes, hypertension, hyperlipidemia, end-stage renal disease on hemodialysis Monday, Wednesday, Friday who presents the emergency department with low hemoglobin and generalized weakness. According to the patient for the past 1 week she has felt extremely fatigued with generalized weakness.Patient finished her dialysis this morning, but her blood work showed her hemoglobin at up to 6.0. Patient was admitted last month for symptomatic anemia due to GI bleed thought to be due to duodenal erosion. Patient states she has noticed dark stool over the past one week. Denies any vomiting. Denies any bright red blood in her stool. Patient did her full dialysis session today, feels very tired denies any chest pain or trouble breathing.  Past Medical History:  Diagnosis Date  . Anemia   . Apnea, sleep    for sleep study 10/17/15-no cpap yet  . Arthritis   . Bronchitis   . Chronic kidney disease   . COPD (chronic obstructive pulmonary disease) ([place])   . Depression   . Diabetes mellitus without complication (HCC)    no meds  . Dialysis patient ([place]) 2014  . GERD (gastroesophageal reflux disease)   . Headache   . Hyperlipidemia   . Hypertension   . Obesity   . Renal dialysis device, implant, or graft complication    RIGHT CHEST CATH    Patient Active Problem List   Diagnosis Date Noted  . Protein-calorie malnutrition, severe 05/29/2016  . Symptomatic anemia 05/28/2016  . Hypotension 05/28/2016  . Hyperglycemia 05/28/2016  . Chest pain 04/25/2016  . Acute respiratory failure with hypoxia ([place]) 10/28/2015  . Pain in limb 10/28/2015  . End stage renal disease ([place]) 10/28/2015  . Essential hypertension  10/28/2015  . Hypoxemia 10/28/2015  . ESRD on hemodialysis ([place]) 03/29/2015  . HTN (hypertension) 03/29/2015  . COPD (chronic obstructive pulmonary disease) ([place]) 03/29/2015  . GAD (generalized anxiety disorder) 03/29/2015    Past Surgical History:  Procedure Laterality Date  . ABDOMINAL HYSTERECTOMY    . COLONOSCOPY  2011  . COLONOSCOPY WITH PROPOFOL N/A 09/20/2015   Procedure: COLONOSCOPY WITH PROPOFOL;  Surgeon: Lucilla Lame, MD;  Location: [facility] ENDOSCOPY;  Service: Endoscopy;  Laterality: N/A;  . DIALYSIS FISTULA CREATION    . ESOPHAGOGASTRODUODENOSCOPY (EGD) WITH PROPOFOL N/A 06/02/2016   Procedure: ESOPHAGOGASTRODUODENOSCOPY (EGD) WITH PROPOFOL;  Surgeon: Manya Silvas, MD;  Location: [facility] ENDOSCOPY;  Service: Endoscopy;  Laterality: N/A;  . PERIPHERAL VASCULAR CATHETERIZATION N/A 05/10/2015   Procedure: A/V Shuntogram/Fistulagram;  Surgeon: Katha Cabal, MD;  Location: [place] CV LAB;  Service: Cardiovascular;  Laterality: N/A;  . PERIPHERAL VASCULAR CATHETERIZATION Left 05/10/2015   Procedure: A/V Shunt Intervention;  Surgeon: Katha Cabal, MD;  Location: [place] CV LAB;  Service: Cardiovascular;  Laterality: Left;  . PERIPHERAL VASCULAR CATHETERIZATION Left 08/23/2015   Procedure: A/V Shuntogram/Fistulagram;  Surgeon: Katha Cabal, MD;  Location: [place] CV LAB;  Service: Cardiovascular;  Laterality: Left;  . PERIPHERAL VASCULAR CATHETERIZATION N/A 08/23/2015   Procedure: A/V Shunt Intervention;  Surgeon: Katha Cabal, MD;  Location: [place] CV LAB;  Service: Cardiovascular;  Laterality: N/A;  . PERIPHERAL VASCULAR CATHETERIZATION  08/23/2015   Procedure: Dialysis/Perma Catheter Insertion;  Surgeon: Katha Cabal, MD;  Location: [place] CV LAB;  Service: Cardiovascular;;  . PERIPHERAL VASCULAR CATHETERIZATION N/A 01/03/2016   Procedure: Dialysis/Perma Catheter Removal;  Surgeon: Katha Cabal, MD;  Location: [place] CV LAB;   Service: Cardiovascular;  Laterality: N/A;  . REVISON OF ARTERIOVENOUS FISTULA Left 10/28/2015   Procedure: REVISON OF ARTERIOVENOUS FISTULA WITH ARTEGRAFT;  Surgeon: Katha Cabal, MD;  Location: [facility] ORS;  Service: Vascular;  Laterality: Left;  . WOUND DEBRIDEMENT Left 10/28/2015   Procedure: Resection of shoulder cyst ( left );  Surgeon: Katha Cabal, MD;  Location: [facility] ORS;  Service: Vascular;  Laterality: Left;    Prior to Admission medications   Medication Sig Start Date End Date Taking? Authorizing Provider  albuterol (PROVENTIL HFA;VENTOLIN HFA) 108 (90 BASE) MCG/ACT inhaler Inhale 2 puffs into the lungs every 6 (six) hours as needed for wheezing or shortness of breath.    Historical Provider, MD  ALPRAZolam Duanne Moron) 0.5 MG tablet Take 0.5 mg by mouth 2 (two) times daily.    Historical Provider, MD  amLODipine (NORVASC) 10 MG tablet Take 10 mg by mouth at bedtime.    Historical Provider, MD  calcium acetate (PHOSLO) 667 MG capsule Take 667-1,334 mg by mouth See admin instructions. Pt takes two capsules three times daily with meals and one capsule two times daily with snacks.    Historical Provider, MD  calcium carbonate (OS-CAL) 600 MG TABS tablet Take 600 mg by mouth 2 (two) times daily with a meal.    Historical Provider, MD  carvedilol (COREG) 25 MG tablet Take 1 tablet (25 mg total) by mouth 2 (two) times daily with a meal. 04/26/16   Bettey Costa, MD  cinacalcet (SENSIPAR) 30 MG tablet Take 60 mg by mouth daily with breakfast.     Historical Provider, MD  ezetimibe (ZETIA) 10 MG tablet Take 10 mg by mouth daily.     Historical Provider, MD  fluticasone (FLONASE) 50 MCG/ACT nasal spray Place 2 sprays into both nostrils daily as needed for rhinitis.    Historical Provider, MD  Fluticasone-Salmeterol (ADVAIR) 250-50 MCG/DOSE AEPB Inhale 1 puff into the lungs 2 (two) times daily.    Historical Provider, MD  furosemide (LASIX) 40 MG tablet Take 40 mg by mouth every Tuesday, Thursday,  Saturday, and Sunday.     Historical Provider, MD  lidocaine-prilocaine (EMLA) cream Apply 1 application topically as needed (prior to accessing port).    Historical Provider, MD  losartan (COZAAR) 100 MG tablet Take 100 mg by mouth every Tuesday, Thursday, Saturday, and Sunday.     Historical Provider, MD  multivitamin (RENA-VIT) TABS tablet Take 1 tablet by mouth at bedtime.     Historical Provider, MD  pantoprazole (PROTONIX) 40 MG tablet Take 1 tablet (40 mg total) by mouth 2 (two) times daily. 06/02/16   Demetrios Loll, MD  sertraline (ZOLOFT) 50 MG tablet Take 50 mg by mouth daily.     Historical Provider, MD    Allergies  Allergen Reactions  . Sulfa Antibiotics Swelling, Rash and Other (See Comments)    Reaction:  Facial/body swelling     Family History  Problem Relation Age of Onset  . Heart disease Mother   . Cancer Father   . Cancer Sister     Social History Social History  Substance Use Topics  . Smoking status: Former Smoker    Packs/day: 0.50    Years: 40.00    Types: Cigarettes    Quit date: 11/23/2014  . Smokeless tobacco: Never  Used  . Alcohol use No    Review of Systems Constitutional: Negative for fever. Cardiovascular: Negative for chest pain. Respiratory: Negative for shortness of breath. Gastrointestinal: Negative for abdominal pain Musculoskeletal: Negative for back pain. Neurological: Negative for headache. Denies focal weakness, but states her last weakness. 10-point ROS otherwise negative.  ____________________________________________   PHYSICAL EXAM:  VITAL SIGNS: ED Triage Vitals  Enc Vitals Group     BP 07/09/16 1634 (!) 163/81     Pulse Rate 07/09/16 1634 72     Resp 07/09/16 1634 20     Temp 07/09/16 1634 98.5 F (36.9 C)     Temp Source 07/09/16 1634 Oral     SpO2 07/09/16 1634 100 %     Weight --      Height --      Head Circumference --      Peak Flow --      Pain Score 07/09/16 1635 0     Pain Loc --      Pain Edu? --       Excl. in [place]? --    Constitutional: Alert, somnolent keeps eyes closed through most of the exam. Answers questions and follows commands appropriately. Eyes: Normal exam ENT   Head: Normocephalic and atraumatic.   Mouth/Throat: Mucous membranes are moist. Cardiovascular: Normal rate, regular rhythm. No murmur Respiratory: Normal respiratory effort without tachypnea nor retractions. Breath sounds are clear Gastrointestinal: Soft and nontender. No distention.   Musculoskeletal: Nontender with normal range of motion in all extremities.  Neurologic:  Normal speech and language. No gross focal neurologic deficits Skin:  Skin is warm, dry and intact.  Psychiatric: Mood and affect are normal. Speech and behavior are normal.   ____________________________________________  EKG reviewed and interpreted by myself shows normal sinus rhythm at 74 bpm, narrow QRS, normal axis, normal intervals besides a slightly prolonged QTC, nonspecific ST changes without ST elevation.  INITIAL IMPRESSION / ASSESSMENT AND PLAN / ED COURSE  Pertinent labs & imaging results that were available during my care of the patient were reviewed by me and considered in my medical decision making (see chart for details).  The patient presents emergency department generalized weakness, and reported hemoglobin of 6.0. We will repeat labs in the emergency department, obtain a rectal exam was were able to place the patient into a room, and plan to likely admit for transfusion.  Patient's hemoglobin is 6.4 in the emergency department, rectal exam shows light brown stool but moderately guaiac positive. We'll admit to the hospital for blood transfusion and further workup. Started on a Protonix infusion Patient agreeable plan. Mild troponin elevation but the patient is on dialysis and likely baseline.    CRITICAL CARE Performed by: Harvest Dark   Total critical care time: 30 minutes  Critical care time was exclusive of  separately billable procedures and treating other patients.  Critical care was necessary to treat or prevent imminent or life-threatening deterioration.  Critical care was time spent personally by me on the following activities: development of treatment plan with patient and/or surrogate as well as nursing, discussions with consultants, evaluation of patient's response to treatment, examination of patient, obtaining history from patient or surrogate, ordering and performing treatments and interventions, ordering and review of laboratory studies, ordering and review of radiographic studies, pulse oximetry and re-evaluation of patient's condition.  ____________________________________________   FINAL CLINICAL IMPRESSION(S) / ED DIAGNOSES  GI bleed Symptomatic anemia    Harvest Dark, MD 07/09/16 1826    Lennette Bihari  Kerman Passey, MD 07/09/16 1827    Harvest Dark, MD 07/09/16 [id number]    Harvest Dark, MD 07/09/16 [id number]

## 2016-07-09 NOTE — ED Notes (Signed)
Pt signature for blood transfusion received. Pt states she has had a transfusion before, went over possible side effects with pt.

## 2016-07-09 NOTE — ED Triage Notes (Signed)
Pt bib EMS w/ c/o abnormal lab.  Per EMS, pt received all dialysis treatment, but had hemoglobin of 6.0.  Pt c/o weakness, denies CP, SOB, n/v/d.  Pt on 3L normally.  Pt keeps dozing off during triage.  NAD.

## 2016-07-09 NOTE — Progress Notes (Signed)
Pt  refuses to have a second IV put in for the Protonix drip. Protonix drip continuous ordered along with blood. Blood is running now, verified with pharmacy that it is okay to delay the continuousdrip until the blood finishes.

## 2016-07-09 NOTE — Progress Notes (Signed)
Pt ate [employer] that family brought in despite education on diet. Pt re-educated, MD order for NPO sips with meds.

## 2016-07-09 NOTE — Progress Notes (Signed)
[facility]  ROUNDING NOTE   Subjective:  Patient well known to [place]. She underwent hemodialysis today and completed her full treatment. She was sent in for worsening anemia. Hemoglobin was found to be 6.4 here. She was recently found to have a duodenal ulcer in July. She was to have follow-up with gastroenterology on 07/16/2016.    Objective:  Vital signs in last 24 hours:  Temp:  [98.5 F (36.9 C)] 98.5 F (36.9 C) (08/14 1634) Pulse Rate:  [72] 72 (08/14 1634) Resp:  [20] 20 (08/14 1634) BP: (163)/(81) 163/81 (08/14 1634) SpO2:  [100 %] 100 % (08/14 1634)  Weight change:  There were no vitals filed for this visit.  Intake/Output: No intake/output data recorded.   Intake/Output this shift:  No intake/output data recorded.  Physical Exam: General: No acute distress  Head: Normocephalic, atraumatic. Moist oral mucosal membranes  Eyes: Anicteric  Neck: Supple, trachea midline  Lungs:  Clear to auscultation, normal effort  Heart: S1S2 no rubs  Abdomen:  Soft, nontender,   Extremities:  peripheral edema.  Neurologic: Nonfocal, moving all four extremities  Skin: No lesions  Access: LUE AVF    Basic Metabolic Panel:  Recent Labs Lab 07/09/16 1642  NA 139  K 3.1*  CL 96*  CO2 33*  GLUCOSE 109*  BUN 19  CREATININE 4.16*  CALCIUM 7.3*    Liver Function Tests:  Recent Labs Lab 07/09/16 1642  AST 28  ALT 18  ALKPHOS 128*  BILITOT 0.4  PROT 6.4*  ALBUMIN 3.3*   No results for input(s): LIPASE, AMYLASE in the last 168 hours. No results for input(s): AMMONIA in the last 168 hours.  CBC:  Recent Labs Lab 07/09/16 1642  WBC 9.6  HGB 6.4*  HCT 19.3*  MCV 100.8*  PLT 276    Cardiac Enzymes:  Recent Labs Lab 07/09/16 1642  TROPONINI 0.05*    BNP: Invalid input(s): POCBNP  CBG: No results for input(s): GLUCAP in the last 168 hours.  Microbiology: Results for orders placed or performed during the hospital encounter of  05/28/16  MRSA PCR Screening     Status: None   Collection Time: 05/30/16 12:09 PM  Result Value Ref Range Status   MRSA by PCR NEGATIVE NEGATIVE Final    Comment:        The GeneXpert MRSA Assay (FDA approved for NASAL specimens only), is one component of a comprehensive MRSA colonization surveillance program. It is not intended to diagnose MRSA infection nor to guide or monitor treatment for MRSA infections.     Coagulation Studies: No results for input(s): LABPROT, INR in the last 72 hours.  Urinalysis: No results for input(s): COLORURINE, LABSPEC, PHURINE, GLUCOSEU, HGBUR, BILIRUBINUR, KETONESUR, PROTEINUR, UROBILINOGEN, NITRITE, LEUKOCYTESUR in the last 72 hours.  Invalid input(s): APPERANCEUR    Imaging: No results found.   Medications:       Assessment/ Plan:  60 y.o. female with pmhx of HTN, hyperlipidemia, diastolic heart failure, hypertension, COPD, tobacco abuse, AOCD, SHPTH, LUE AVF, ESRD 02/2013, admission for severe anemia/black stools 05/28/16.  [facility]/MWF  1. End-stage renal disease on hemodialysis MWF. Patient did complete hemodialysis today as an outpatient. Therefore no further indication for dialysis today. We may need to reassess her for dialysis tomorrow given ongoing anemia and need for blood transfusion.  2. Anemia of chronic kidney disease/GI bleed. Patient continues to have black tarry stools. She has a known duodenal ulcer. Recommend at least trans-using 1 unit PRBC today. Would reconsult  gastroenterology as well.  3. Secondary hyperparathyroidism.  We recommend rechecking phosphorus with the next dialysis treatment. Previously patient was on calcium acetate as well as Sensipar.   LOS: 0 Asim Gersten 8/14/20176:11 PM

## 2016-07-09 NOTE — H&P (Signed)
[facility] [facility] [place] NAME: Michele Nash    MR#:  [id number]  DATE OF BIRTH:  03-14-1956   DATE OF ADMISSION:  07/09/2016  PRIMARY CARE PHYSICIAN: [name], MD   REQUESTING/REFERRING PHYSICIAN: Paduchowski  CHIEF COMPLAINT:   Dark stools  HISTORY OF PRESENT ILLNESS:  Michele Nash  is a 60 y.o. female with a known history of Recent duodenal ulcer 1 month ago, end-stage renal disease on hemodialysis who is presenting with continued dark stools. She states she's been having dark stools for about a week denies any further symptoms. Blood test performed today which revealed anemia. She did complete hemodialysis today without complications however with anemia sent to Hospital further workup and evaluation. Skin denies any further complaints shortness of breath, chest pain, palpitations,  PAST MEDICAL HISTORY:   Past Medical History:  Diagnosis Date  . Anemia   . Apnea, sleep    for sleep study 10/17/15-no cpap yet  . Arthritis   . Bronchitis   . Chronic kidney disease   . COPD (chronic obstructive pulmonary disease) ([place])   . Depression   . Diabetes mellitus without complication (HCC)    no meds  . Dialysis patient ([facility]) 2014  . GERD (gastroesophageal reflux disease)   . Headache   . Hyperlipidemia   . Hypertension   . Obesity   . Renal dialysis device, implant, or graft complication    RIGHT CHEST CATH    PAST SURGICAL HISTORY:   Past Surgical History:  Procedure Laterality Date  . ABDOMINAL HYSTERECTOMY    . COLONOSCOPY  2011  . COLONOSCOPY WITH PROPOFOL N/A 09/20/2015   Procedure: COLONOSCOPY WITH PROPOFOL;  Surgeon: Lucilla Lame, MD;  Location: [facility] ENDOSCOPY;  Service: Endoscopy;  Laterality: N/A;  . DIALYSIS FISTULA CREATION    . ESOPHAGOGASTRODUODENOSCOPY (EGD) WITH PROPOFOL N/A 06/02/2016   Procedure: ESOPHAGOGASTRODUODENOSCOPY (EGD) WITH PROPOFOL;  Surgeon: Manya Silvas, MD;  Location: [facility] ENDOSCOPY;  Service:  Endoscopy;  Laterality: N/A;  . PERIPHERAL VASCULAR CATHETERIZATION N/A 05/10/2015   Procedure: A/V Shuntogram/Fistulagram;  Surgeon: Katha Cabal, MD;  Location: [place] CV LAB;  Service: Cardiovascular;  Laterality: N/A;  . PERIPHERAL VASCULAR CATHETERIZATION Left 05/10/2015   Procedure: A/V Shunt Intervention;  Surgeon: Katha Cabal, MD;  Location: [place] [facility] LAB;  Service: Cardiovascular;  Laterality: Left;  . PERIPHERAL VASCULAR CATHETERIZATION Left 08/23/2015   Procedure: A/V Shuntogram/Fistulagram;  Surgeon: Katha Cabal, MD;  Location: [place] [facility] LAB;  Service: Cardiovascular;  Laterality: Left;  . PERIPHERAL VASCULAR CATHETERIZATION N/A 08/23/2015   Procedure: A/V Shunt Intervention;  Surgeon: Katha Cabal, MD;  Location: [place] [facility] LAB;  Service: Cardiovascular;  Laterality: N/A;  . PERIPHERAL VASCULAR CATHETERIZATION  08/23/2015   Procedure: Dialysis/Perma Catheter Insertion;  Surgeon: Katha Cabal, MD;  Location: [place] [facility] LAB;  Service: Cardiovascular;;  . PERIPHERAL VASCULAR CATHETERIZATION N/A 01/03/2016   Procedure: Dialysis/Perma Catheter Removal;  Surgeon: Katha Cabal, MD;  Location: [place] CV LAB;  Service: Cardiovascular;  Laterality: N/A;  . REVISON OF ARTERIOVENOUS FISTULA Left 10/28/2015   Procedure: REVISON OF ARTERIOVENOUS FISTULA WITH ARTEGRAFT;  Surgeon: Katha Cabal, MD;  Location: [facility] ORS;  Service: Vascular;  Laterality: Left;  . WOUND DEBRIDEMENT Left 10/28/2015   Procedure: Resection of shoulder cyst ( left );  Surgeon: Katha Cabal, MD;  Location: [facility] ORS;  Service: Vascular;  Laterality: Left;    SOCIAL HISTORY:   Social History  Substance Use Topics  . Smoking status: Former Smoker    Packs/day: 0.50    Years: 40.00    Types: Cigarettes    Quit date: 11/23/2014  . Smokeless tobacco: Never Used  . Alcohol use No    FAMILY HISTORY:   Family History  Problem Relation Age of Onset   . Heart disease Mother   . Cancer Father   . Cancer Sister     DRUG ALLERGIES:   Allergies  Allergen Reactions  . Sulfa Antibiotics Swelling, Rash and Other (See Comments)    Reaction:  Facial/body swelling     REVIEW OF SYSTEMS:  REVIEW OF SYSTEMS:  CONSTITUTIONAL: Denies fevers, chills, fatigue, weakness.  EYES: Denies blurred vision, double vision, or eye pain.  EARS, NOSE, THROAT: Denies tinnitus, ear pain, hearing loss.  RESPIRATORY: denies cough, shortness of breath, wheezing  CARDIOVASCULAR: Denies chest pain, palpitations, edema.  GASTROINTESTINAL: Denies nausea, vomiting, diarrhea, abdominal pain.  GENITOURINARY: Denies dysuria, hematuria.  ENDOCRINE: Denies nocturia or thyroid problems. HEMATOLOGIC AND LYMPHATIC: Denies easy bruising or bleeding.  SKIN: Denies rash or lesions.  MUSCULOSKELETAL: Denies pain in neck, back, shoulder, knees, hips, or further arthritic symptoms.  NEUROLOGIC: Denies paralysis, paresthesias.  PSYCHIATRIC: Denies anxiety or depressive symptoms. Otherwise full review of systems performed by me is negative.   MEDICATIONS AT HOME:   Prior to Admission medications   Medication Sig Start Date End Date Taking? Authorizing Provider  albuterol (PROVENTIL HFA;VENTOLIN HFA) 108 (90 BASE) MCG/ACT inhaler Inhale 2 puffs into the lungs every 6 (six) hours as needed for wheezing or shortness of breath.    Historical Provider, MD  ALPRAZolam Duanne Moron) 0.5 MG tablet Take 0.5 mg by mouth 2 (two) times daily.    Historical Provider, MD  amLODipine (NORVASC) 10 MG tablet Take 10 mg by mouth at bedtime.    Historical Provider, MD  calcium acetate (PHOSLO) 667 MG capsule Take 667-1,334 mg by mouth See admin instructions. Pt takes two capsules three times daily with meals and one capsule two times daily with snacks.    Historical Provider, MD  calcium carbonate (OS-CAL) 600 MG TABS tablet Take 600 mg by mouth 2 (two) times daily with a meal.    Historical  Provider, MD  carvedilol (COREG) 25 MG tablet Take 1 tablet (25 mg total) by mouth 2 (two) times daily with a meal. 04/26/16   Bettey Costa, MD  cinacalcet (SENSIPAR) 30 MG tablet Take 60 mg by mouth daily with breakfast.     Historical Provider, MD  ezetimibe (ZETIA) 10 MG tablet Take 10 mg by mouth daily.     Historical Provider, MD  fluticasone (FLONASE) 50 MCG/ACT nasal spray Place 2 sprays into both nostrils daily as needed for rhinitis.    Historical Provider, MD  Fluticasone-Salmeterol (ADVAIR) 250-50 MCG/DOSE AEPB Inhale 1 puff into the lungs 2 (two) times daily.    Historical Provider, MD  furosemide (LASIX) 40 MG tablet Take 40 mg by mouth every Tuesday, Thursday, Saturday, and Sunday.     Historical Provider, MD  lidocaine-prilocaine (EMLA) cream Apply 1 application topically as needed (prior to accessing port).    Historical Provider, MD  losartan (COZAAR) 100 MG tablet Take 100 mg by mouth every Tuesday, Thursday, Saturday, and Sunday.     Historical Provider, MD  multivitamin (RENA-VIT) TABS tablet Take 1 tablet by mouth at bedtime.     Historical Provider, MD  pantoprazole (PROTONIX) 40 MG tablet Take 1 tablet (40 mg total) by mouth 2 (  two) times daily. 06/02/16   Demetrios Loll, MD  sertraline (ZOLOFT) 50 MG tablet Take 50 mg by mouth daily.     Historical Provider, MD      VITAL SIGNS:  Blood pressure (!) 163/81, pulse 72, temperature 98.5 F (36.9 C), temperature source Oral, resp. rate 20, SpO2 100 %.  PHYSICAL EXAMINATION:  VITAL SIGNS: Vitals:   07/09/16 1634  BP: (!) 163/81  Pulse: 72  Resp: 20  Temp: 98.5 F (36.9 C)   GENERAL:60 y.o.female currently in no acute distress.  HEAD: Normocephalic, atraumatic.  EYES: Pupils equal, round, reactive to light. Extraocular muscles intact. No scleral icterus.  MOUTH: Moist mucosal membrane. Dentition intact. No abscess noted.  EAR, NOSE, THROAT: Clear without exudates. No external lesions.  NECK: Supple. No thyromegaly. No nodules.  No JVD.  PULMONARY: Clear to ascultation, without wheeze rails or rhonci. No use of accessory muscles, Good respiratory effort. good air entry bilaterally CHEST: Nontender to palpation.  CARDIOVASCULAR: S1 and S2. Regular rate and rhythm. No murmurs, rubs, or gallops. No edema. Pedal pulses 2+ bilaterally.  GASTROINTESTINAL: Soft, nontender, nondistended. No masses. Positive bowel sounds. No hepatosplenomegaly.  MUSCULOSKELETAL: No swelling, clubbing, or edema. Range of motion full in all extremities.  NEUROLOGIC: Cranial nerves II through XII are intact. No gross focal neurological deficits. Sensation intact. Reflexes intact.  SKIN: No ulceration, lesions, rashes, or cyanosis. Skin warm and dry. Turgor intact.  PSYCHIATRIC: Mood, affect within normal limits. The patient is awake, alert and oriented x 3. Insight, judgment intact.    LABORATORY PANEL:   CBC  Recent Labs Lab 07/09/16 1642  WBC 9.6  HGB 6.4*  HCT 19.3*  PLT 276   ------------------------------------------------------------------------------------------------------------------  Chemistries   Recent Labs Lab 07/09/16 1642  NA 139  K 3.1*  CL 96*  CO2 33*  GLUCOSE 109*  BUN 19  CREATININE 4.16*  CALCIUM 7.3*  AST 28  ALT 18  ALKPHOS 128*  BILITOT 0.4   ------------------------------------------------------------------------------------------------------------------  Cardiac Enzymes  Recent Labs Lab 07/09/16 1642  TROPONINI 0.05*   ------------------------------------------------------------------------------------------------------------------  RADIOLOGY:  No results found.  EKG:   Orders placed or performed during the hospital encounter of 05/28/16  . ED EKG  . ED EKG  . EKG 12-Lead  . EKG 12-Lead  . EKG    IMPRESSION AND PLAN:   60 year old African-American female history end-stage renal disease on hemodialysis, recent duodenal ulcer presenting with GI bleed  1. GI bleed, upper:  Transfuse 1 unit packed red blood cells as ordered in emergency department, place on Protonix, consult gastroenterology trend CBC every 6 hours transfusing hemoglobin less than 7 2. End-stage renal disease on hemodialysis nephrology following continue routine dialysis-I question if she will need dialysis after receiving transfusion-she is still able to make urine, we'll continue with Lasix 3. Essential hypertension: Coreg Norvasc 4. GERD: Protonix drip for now    All the records are reviewed and case discussed with ED provider. Management plans discussed with the patient, family and they are in agreement.  CODE STATUS: Full  TOTAL TIME TAKING CARE OF THIS PATIENT: 33 minutes.    Hower,  Karenann Cai.D on 07/09/2016 at 6:58 PM  Between 7am to 6pm - Pager - [phone]  After 6pm: House Pager: - [phone]  [facility]  Office  [phone]  CC: Primary care physician; [name], MD

## 2016-07-10 LAB — CBC
HCT: 21.2 % — ABNORMAL LOW (ref 35.0–47.0)
HCT: 22.2 % — ABNORMAL LOW (ref 35.0–47.0)
HEMATOCRIT: 25 % — AB (ref 35.0–47.0)
HEMOGLOBIN: 7.2 g/dL — AB (ref 12.0–16.0)
Hemoglobin: 6.9 g/dL — ABNORMAL LOW (ref 12.0–16.0)
Hemoglobin: 8 g/dL — ABNORMAL LOW (ref 12.0–16.0)
MCH: 32.2 pg (ref 26.0–34.0)
MCH: 32.4 pg (ref 26.0–34.0)
MCH: 32.2 pg (ref 26.0–34.0)
MCHC: 32.7 g/dL (ref 32.0–36.0)
MCHC: 32.4 g/dL (ref 32.0–36.0)
MCHC: 32.2 g/dL (ref 32.0–36.0)
MCV: 98.4 fL (ref 80.0–100.0)
MCV: 100 fL (ref 80.0–100.0)
MCV: 99.9 fL (ref 80.0–100.0)
PLATELETS: 242 10*3/uL (ref 150–440)
PLATELETS: 235 10*3/uL (ref 150–440)
Platelets: 257 10*3/uL (ref 150–440)
RBC: 2.16 MIL/uL — ABNORMAL LOW (ref 3.80–5.20)
RBC: 2.22 MIL/uL — AB (ref 3.80–5.20)
RBC: 2.5 MIL/uL — AB (ref 3.80–5.20)
RDW: 19.5 % — AB (ref 11.5–14.5)
RDW: 19.4 % — ABNORMAL HIGH (ref 11.5–14.5)
RDW: 18.8 % — ABNORMAL HIGH (ref 11.5–14.5)
WBC: 11.1 10*3/uL — AB (ref 3.6–11.0)
WBC: 9.4 10*3/uL (ref 3.6–11.0)
WBC: 10.2 10*3/uL (ref 3.6–11.0)

## 2016-07-10 LAB — PREPARE RBC (CROSSMATCH)

## 2016-07-10 MED ORDER — NALOXONE HCL 0.4 MG/ML IJ SOLN
0.4000 mg | INTRAMUSCULAR | Status: DC | PRN
  Administered 2016-07-10: 12:00:00 0.4 mg via INTRAVENOUS
  Filled 2016-07-10: qty 1

## 2016-07-10 MED ORDER — SODIUM CHLORIDE 0.9 % IV SOLN
Freq: Once | INTRAVENOUS | Status: AC
  Administered 2016-07-10: 14:00:00 via INTRAVENOUS

## 2016-07-10 NOTE — Progress Notes (Signed)
[facility]  ROUNDING NOTE   Subjective:  Hemoglobin this a.m. is only up to 6.9. Gastroenterology consulted. She completed hemodialysis as an outpatient yesterday. Will be due for dialysis again tomorrow.    Objective:  Vital signs in last 24 hours:  Temp:  [97.4 F (36.3 C)-98.8 F (37.1 C)] 97.4 F (36.3 C) (08/15 1003) Pulse Rate:  [64-79] 66 (08/15 1121) Resp:  [12-24] 16 (08/15 1003) BP: (145-192)/(62-84) 161/67 (08/15 1121) SpO2:  [93 %-100 %] 95 % (08/15 1003) Weight:  [60.9 kg (134 lb 3.2 oz)] 60.9 kg (134 lb 3.2 oz) (08/14 2020)  Weight change:  Filed Weights   07/09/16 2020  Weight: 60.9 kg (134 lb 3.2 oz)    Intake/Output: I/O last 3 completed shifts: In: 459 [I.V.:94; Blood:365] Out: 50 [Urine:50]   Intake/Output this shift:  No intake/output data recorded.  Physical Exam: General: No acute distress  Head: Normocephalic, atraumatic. Moist oral mucosal membranes  Eyes: Anicteric  Neck: Supple, trachea midline  Lungs:  Clear to auscultation, normal effort  Heart: S1S2 no rubs  Abdomen:  Soft, nontender,   Extremities:  peripheral edema.  Neurologic: Nonfocal, moving all four extremities  Skin: No lesions  Access: LUE AVF    Basic Metabolic Panel:  Recent Labs Lab 07/09/16 1642  NA 139  K 3.1*  CL 96*  CO2 33*  GLUCOSE 109*  BUN 19  CREATININE 4.16*  CALCIUM 7.3*    Liver Function Tests:  Recent Labs Lab 07/09/16 1642  AST 28  ALT 18  ALKPHOS 128*  BILITOT 0.4  PROT 6.4*  ALBUMIN 3.3*   No results for input(s): LIPASE, AMYLASE in the last 168 hours. No results for input(s): AMMONIA in the last 168 hours.  CBC:  Recent Labs Lab 07/09/16 1642 07/10/16 0518  WBC 9.6 11.1*  HGB 6.4* 6.9*  HCT 19.3* 21.2*  MCV 100.8* 98.4  PLT 276 242    Cardiac Enzymes:  Recent Labs Lab 07/09/16 1642  TROPONINI 0.05*    BNP: Invalid input(s): POCBNP  CBG: No results for input(s): GLUCAP in the last 168  hours.  Microbiology: Results for orders placed or performed during the hospital encounter of 05/28/16  MRSA PCR Screening     Status: None   Collection Time: 05/30/16 12:09 PM  Result Value Ref Range Status   MRSA by PCR NEGATIVE NEGATIVE Final    Comment:        The GeneXpert MRSA Assay (FDA approved for NASAL specimens only), is one component of a comprehensive MRSA colonization surveillance program. It is not intended to diagnose MRSA infection nor to guide or monitor treatment for MRSA infections.     Coagulation Studies: No results for input(s): LABPROT, INR in the last 72 hours.  Urinalysis: No results for input(s): COLORURINE, LABSPEC, PHURINE, GLUCOSEU, HGBUR, BILIRUBINUR, KETONESUR, PROTEINUR, UROBILINOGEN, NITRITE, LEUKOCYTESUR in the last 72 hours.  Invalid input(s): APPERANCEUR    Imaging: No results found.   Medications:       Assessment/ Plan:  60 y.o. female with pmhx of HTN, hyperlipidemia, diastolic heart failure, hypertension, COPD, tobacco abuse, AOCD, SHPTH, LUE AVF, ESRD 02/2013, admission for severe anemia/black stools 05/28/16.  [facility] [place]/MWF  1. End-stage renal disease on hemodialysis MWF. Patient completed hemodialysis as an outpatient yesterday. No urgent indication for dialysis at the moment. We will plan for dialysis again tomorrow.  2. Anemia of chronic kidney disease/GI bleed. Patient continues to have black tarry stools. She has a known duodenal ulcer.  -  Patient transfused 1 unit PRBC yesterday. Hemoglobin only 6.9. Consider 1 additional unit of PRBC transfusion.  3. Secondary hyperparathyroidism.  Binders and Sensipar currently on hold given GI bleed. Check intact PTH and phosphorus with next dialysis treatment.   LOS: 1 Michele Nash 8/15/201711:35 AM

## 2016-07-10 NOTE — Progress Notes (Signed)
Per MD give 0.4 of narcan patient not arousing

## 2016-07-10 NOTE — Care Management (Signed)
Admitted to [facility] with the diagnosis of GI Bleed. Lives with husband, Harrell Gave, [phone] or [phone]) sometimes and Sister Velva Harman sometimes.  Home oxygen through [facility] since the 1st of December. 3 liters per nasal cannula continuous. No home Health. No skilled facility. Uses no aids for ambulation. No falls. Appetite So-So. Lost 9 pounds since July. Takes care of all basic activities of daily living herself, drives as needed. Prescriptions are filled at [facility] on [address]. And per Mail order. Husband or sister will transport. Shelbie Ammons RN MSN CCM Care Management [phone]

## 2016-07-10 NOTE — Progress Notes (Signed)
[place] [facility] [place] NAME: Michele Nash    MR#:  [id number]  DATE OF BIRTH:  [birth year]  SUBJECTIVE:   Pt. Here due to melanotic stools and likely a GI bleed.  Very lethargic/encephalopathic this afternoon.  Got some Oxycodone earlier.    REVIEW OF SYSTEMS:    Review of Systems  Unable to perform ROS: Mental acuity    Nutrition: NPO but ate earlier  Tolerating Diet: Yes Tolerating PT: Await Eval.    DRUG ALLERGIES:   Allergies  Allergen Reactions  . Sulfa Antibiotics Itching, Swelling, Rash and Other (See Comments)    Reaction:  Facial/body swelling     VITALS:  Blood pressure (!) 161/67, pulse 67, temperature 97.4 F (36.3 C), temperature source Oral, resp. rate 16, height 5\' 5"  (1.651 m), weight 60.9 kg (134 lb 3.2 oz), SpO2 100 %.  PHYSICAL EXAMINATION:   Physical Exam  GENERAL:  60 y.o.-year-old patient sitting up in chair encephalopathic/lethargic.   EYES: Pupils pinpoint and minimally responsive.  No scleral icterus. Extraocular muscles intact.  HEENT: Head atraumatic, normocephalic. Oropharynx and nasopharynx clear.  NECK:  Supple, no jugular venous distention. No thyroid enlargement, no tenderness.  LUNGS: Normal breath sounds bilaterally, no wheezing, rales, rhonchi. No use of accessory muscles of respiration.  CARDIOVASCULAR: S1, S2 normal. No murmurs, rubs, or gallops.  ABDOMEN: Soft, nontender, nondistended. Bowel sounds present. No organomegaly or mass.  EXTREMITIES: No cyanosis, clubbing or edema b/l.    NEUROLOGIC: Cranial nerves II through XII are intact. No focal Motor or sensory deficits b/l. Globally weak.     PSYCHIATRIC: The patient is alert and oriented x 1.  SKIN: No obvious rash, lesion, or ulcer.    LABORATORY PANEL:   CBC  Recent Labs Lab 07/10/16 1123  WBC 9.4  HGB 7.2*  HCT 22.2*  PLT 257    ------------------------------------------------------------------------------------------------------------------  Chemistries   Recent Labs Lab 07/09/16 1642  NA 139  K 3.1*  CL 96*  CO2 33*  GLUCOSE 109*  BUN 19  CREATININE 4.16*  CALCIUM 7.3*  AST 28  ALT 18  ALKPHOS 128*  BILITOT 0.4   ------------------------------------------------------------------------------------------------------------------  Cardiac Enzymes  Recent Labs Lab 07/09/16 1642  TROPONINI 0.05*   ------------------------------------------------------------------------------------------------------------------  RADIOLOGY:  No results found.   ASSESSMENT AND PLAN:   60 year old female with past medical history of end-stage renal disease on hemodialysis, hypertension, hyperlipidemia, GERD, COPD, obstructive sleep apnea, chronic anemia who presents to the hospital due to melanotic stools.  1. GI bleed-patient presented to the hospital with melanotic stools. She has a previous history of duodenal ulcer. -Hemoglobin improved with transfusion. We'll give 1 more unit today. Follow hemoglobin. - cont. Protonix gtt.  - Await GI input.  Likely will need repeat Endoscopy.   2. Acute blood loss Anemia - secondary to the GI bleed. -Follow serial hemoglobin. We'll give 1 more unit of packed red blood cells today.  3. Altered mental status-metabolic encephalopathy secondary to pain medications. -Hold oxycodone for now. Patient given naloxone with minimal improvement in her mental status and will monitor.  4. Anxiety-continue Xanax as needed.  5. End-stage renal disease on hemodialysis-nephrology has been consulted. Continue dialysis Monday Wednesday Friday as per them.  6. Secondary hyperparathyroidism-continue PhosLo, Sensipar.  7. Essential hypertension-continue Norvasc, carvedilol, losartan.  8. Depression-continue Zoloft.  9. COPD-no acute exacerbation-continue Dulera.   All the records  are reviewed and case discussed with Care Management/Social Workerr. Management plans discussed with the patient,  family and they are in agreement.  CODE STATUS: Full  DVT Prophylaxis: TEd's & SCD's  TOTAL TIME TAKING CARE OF THIS PATIENT: 30 minutes.   POSSIBLE D/C IN 1-2 DAYS, DEPENDING ON CLINICAL CONDITION.   Henreitta Leber M.D on 07/10/2016 at 2:02 PM  Between 7am to 6pm - Pager - [phone]  After 6pm go to [email or web address] - password [email or web address] [place] [facility]  Office  [phone]  CC: Primary care physician; [name], MD

## 2016-07-10 NOTE — Progress Notes (Signed)
Patient is arousable and responding but still sleepy

## 2016-07-10 NOTE — Care Management Important Message (Signed)
Important Message  Patient Details  Name: Michele Nash MRN: [id number] Date of Birth: 04-07-1956   Medicare Important Message Given:  Yes    Shelbie Ammons, RN 07/10/2016, 9:14 AM

## 2016-07-10 NOTE — Progress Notes (Signed)
Initial Nutrition Assessment  DOCUMENTATION CODES:   Not applicable  INTERVENTION:  -Monitor diet progression and intake - If unable to meet nutritional needs will add supplement   NUTRITION DIAGNOSIS:   Inadequate oral intake related to altered GI function as evidenced by NPO status.    GOAL:   Patient will meet greater than or equal to 90% of their needs    MONITOR:   Diet advancement  REASON FOR ASSESSMENT:   Malnutrition Screening Tool    ASSESSMENT:      Pt admitted with GI bleed. Pt sleeping, snoring and unable to wake at this time. Noted HD  Past Medical History:  Diagnosis Date  . Anemia   . Apnea, sleep    for sleep study 10/17/15-no cpap yet  . Arthritis   . Bronchitis   . Chronic kidney disease   . COPD (chronic obstructive pulmonary disease) ([place])   . Depression   . Dialysis patient ([place]) 2014  . GERD (gastroesophageal reflux disease)   . Headache   . Hyperlipidemia   . Hypertension   . Obesity   . Renal dialysis device, implant, or graft complication    RIGHT CHEST CATH   No family at bedside at this time.    Medications reviewed:  Phoslo, sensipar, lasix, MVI Labs reviewed: K 3.1, BUN WDL, creatinine 4.16, glucose 109   Unable to complete Nutrition-Focused physical exam at this time.    Diet Order:  Diet NPO time specified Except for: Sips with Meds  Skin:     Last BM:  PTA  Height:   Ht Readings from Last 1 Encounters:  07/09/16 5\' 5"  (1.651 m)    Weight: Noted per wt encounters 4% wt loss in the last 6 months.   Wt Readings from Last 1 Encounters:  07/09/16 134 lb 3.2 oz (60.9 kg)    Ideal Body Weight:     BMI:  Body mass index is 22.33 kg/m.  Estimated Nutritional Needs:   Kcal:  1525-1830 kcals/d  Protein:  73-92 g/d  Fluid:  1024ml + UOP  EDUCATION NEEDS:   No education needs identified at this time  Mikayah Joy B. Zenia Resides, [place], [place] (pager) Weekend/On-Call pager [phone])

## 2016-07-11 LAB — CBC
HCT: 23.5 % — ABNORMAL LOW (ref 35.0–47.0)
HEMOGLOBIN: 7.4 g/dL — AB (ref 12.0–16.0)
MCH: 31.4 pg (ref 26.0–34.0)
MCHC: 31.5 g/dL — AB (ref 32.0–36.0)
MCV: 99.6 fL (ref 80.0–100.0)
PLATELETS: 244 10*3/uL (ref 150–440)
RBC: 2.36 MIL/uL — AB (ref 3.80–5.20)
RDW: 18.6 % — ABNORMAL HIGH (ref 11.5–14.5)
WBC: 8.9 10*3/uL (ref 3.6–11.0)

## 2016-07-11 LAB — TYPE AND SCREEN
ABO/RH(D): O POS
ANTIBODY SCREEN: NEGATIVE
Unit division: 0
Unit division: 0

## 2016-07-11 LAB — BASIC METABOLIC PANEL
Anion gap: 14 (ref 5–15)
BUN: 34 mg/dL — AB (ref 6–20)
CALCIUM: 6.8 mg/dL — AB (ref 8.9–10.3)
CHLORIDE: 99 mmol/L — AB (ref 101–111)
CO2: 26 mmol/L (ref 22–32)
CREATININE: 7.18 mg/dL — AB (ref 0.44–1.00)
GFR, EST AFRICAN AMERICAN: 6 mL/min — AB (ref >60)
GFR, EST NON AFRICAN AMERICAN: 5 mL/min — AB (ref >60)
Glucose, Bld: 111 mg/dL — ABNORMAL HIGH (ref 65–99)
Potassium: 4.1 mmol/L (ref 3.5–5.1)
SODIUM: 139 mmol/L (ref 135–145)

## 2016-07-11 LAB — PHOSPHORUS: PHOSPHORUS: 5.5 mg/dL — AB (ref 2.5–4.6)

## 2016-07-11 NOTE — Progress Notes (Signed)
START OF HD 

## 2016-07-11 NOTE — Progress Notes (Signed)
[facility]  ROUNDING NOTE   Subjective:  Patient quite lethargic today. She is arousable however. Nursing advises me that she received oxycodone earlier.    Objective:  Vital signs in last 24 hours:  Temp:  [98 F (36.7 C)-98.6 F (37 C)] 98 F (36.7 C) (08/16 1230) Pulse Rate:  [61-90] 90 (08/16 1328) Resp:  [16-24] 16 (08/16 1328) BP: (137-155)/(65-94) 137/75 (08/16 1328) SpO2:  [98 %-100 %] 100 % (08/16 1328) Weight:  [68.7 kg (151 lb 7.3 oz)] 68.7 kg (151 lb 7.3 oz) (08/16 1230)  Weight change:  Filed Weights   07/09/16 2020 07/11/16 1230  Weight: 60.9 kg (134 lb 3.2 oz) 68.7 kg (151 lb 7.3 oz)    Intake/Output: I/O last 3 completed shifts: In: 1383.5 [P.O.:240; I.V.:416.5; Blood:727] Out: 225 [Urine:225]   Intake/Output this shift:  Total I/O In: 720 [P.O.:720] Out: -   Physical Exam: General: No acute distress  Head: Normocephalic, atraumatic. Moist oral mucosal membranes  Eyes: Anicteric  Neck: Supple, trachea midline  Lungs:  Clear to auscultation, normal effort  Heart: S1S2 no rubs  Abdomen:  Soft, nontender,   Extremities: No peripheral edema.  Neurologic: Lethargic but arousable  Skin: No lesions  Access: LUE AVF    Basic Metabolic Panel:  Recent Labs Lab 07/09/16 1642 07/11/16 0417 07/11/16 0428  NA 139 139  --   K 3.1* 4.1  --   CL 96* 99*  --   CO2 33* 26  --   GLUCOSE 109* 111*  --   BUN 19 34*  --   CREATININE 4.16* 7.18*  --   CALCIUM 7.3* 6.8*  --   PHOS  --   --  5.5*    Liver Function Tests:  Recent Labs Lab 07/09/16 1642  AST 28  ALT 18  ALKPHOS 128*  BILITOT 0.4  PROT 6.4*  ALBUMIN 3.3*   No results for input(s): LIPASE, AMYLASE in the last 168 hours. No results for input(s): AMMONIA in the last 168 hours.  CBC:  Recent Labs Lab 07/09/16 1642 07/10/16 0518 07/10/16 1123 07/10/16 2005 07/11/16 0417  WBC 9.6 11.1* 9.4 10.2 8.9  HGB 6.4* 6.9* 7.2* 8.0* 7.4*  HCT 19.3* 21.2* 22.2* 25.0*  23.5*  MCV 100.8* 98.4 100.0 99.9 99.6  PLT 276 242 257 235 244    Cardiac Enzymes:  Recent Labs Lab 07/09/16 1642  TROPONINI 0.05*    BNP: Invalid input(s): POCBNP  CBG: No results for input(s): GLUCAP in the last 168 hours.  Microbiology: Results for orders placed or performed during the hospital encounter of 05/28/16  MRSA PCR Screening     Status: None   Collection Time: 05/30/16 12:09 PM  Result Value Ref Range Status   MRSA by PCR NEGATIVE NEGATIVE Final    Comment:        The GeneXpert MRSA Assay (FDA approved for NASAL specimens only), is one component of a comprehensive MRSA colonization surveillance program. It is not intended to diagnose MRSA infection nor to guide or monitor treatment for MRSA infections.     Coagulation Studies: No results for input(s): LABPROT, INR in the last 72 hours.  Urinalysis: No results for input(s): COLORURINE, LABSPEC, PHURINE, GLUCOSEU, HGBUR, BILIRUBINUR, KETONESUR, PROTEINUR, UROBILINOGEN, NITRITE, LEUKOCYTESUR in the last 72 hours.  Invalid input(s): APPERANCEUR    Imaging: No results found.   Medications:       Assessment/ Plan:  60 y.o. female with pmhx of HTN, hyperlipidemia, diastolic heart failure, hypertension, COPD,  tobacco abuse, AOCD, SHPTH, LUE AVF, ESRD 02/2013, admission for severe anemia/black stools 05/28/16.  [facility]/MWF  1. End-stage renal disease on hemodialysis MWF. Patient due for hemodialysis today.  Orders have been prepared.  She is a bit lethargic today.  Yesterday she was given Narcan.  2. Anemia of chronic kidney disease/GI bleed. Patient continues to have black tarry stools. She has a known duodenal ulcer.  - Patient status post transfusion.  Hemoglobin currently 7.4.  3. Secondary hyperparathyroidism.  Phosphorus currently 5.5 and acceptable.  Continue to monitor bone mineral metabolism parameters.   LOS: 2 Casee Knepp 8/16/20171:33 PM

## 2016-07-11 NOTE — Consult Note (Signed)
Please see full GI consult by Ms. Bridges. Patient seen and examined, chart reviewed. Patient presenting back to the emergency room after being found with anemia and report of black stools for about 5 days. Patient has had multiple blood transfusions over the period past 6-8 weeks. She did have an EGD on 06/02/2016 showing a small duodenal ulcer. However reported black stools associated with drop of hemoglobin aces question is whether this is adequately treated. Her last stool was yesterday. Review of previous EGD shows no biopsy for H. pylori at the time.  Proceed with EGD tomorrow, serial hemoglobin with transfusion as needed, continue PPI drip as you are, we'll also check a H. pylori stool antigen. I have discussed the risks benefits and complications of procedures to include not limited to bleeding, infection, perforation and the risk of sedation and the patient wishes to proceed.

## 2016-07-11 NOTE — Progress Notes (Signed)
END OF HD 

## 2016-07-11 NOTE — Progress Notes (Signed)
Pt refusing to keep access arm still and saying that she is "not going to stay still and leave her alone." MD present and floor RN aware.

## 2016-07-11 NOTE — Progress Notes (Signed)
Pt extremely agitated upon arrival. Non compliant with laying still in bed and not moving access arm. Arguing and saying I am "pushing her" when I placed my hand on her left shoulder to keep her in bed. Yelling at this Rn and telling me to "leave her alone." A. Williams RN to complete pt tx. Called floor RN and requested sitter. He informed me this is pt baseline.  She is also falling asleep heavily. When awakened she tries to get out of bed.

## 2016-07-11 NOTE — Progress Notes (Signed)
END OF HD TX 

## 2016-07-11 NOTE — Progress Notes (Signed)
AJimmye Norman RN will be caring for pt while in HD for remainder of tx.

## 2016-07-11 NOTE — Progress Notes (Signed)
PRE HD ASSESSMENT 

## 2016-07-11 NOTE — Consult Note (Signed)
GI Inpatient Consult Note  Reason for Consult:   Attending Requesting Consult:  History of Present Illness: Michele Nash is a 60 y.o. female seen for evaluation of anemia.melena at the request of Dr. Verdell Carmine. PMHx of ESRD on HD MWF, hyperparathyroidism, HTN. She has a history of a duodenal ulcer.   She wass admitted earlier in July for anemia and heme + stool/melena. During that visit had EGD showing single tiny erosion in 2nd portion of duodenum. She was on antiplatelet prior to last GI bleed. Antiplatelet was discontinued at discharge.  Hgb at discharge on 06/02/16 was 7.9. Dropped to 5.8 on 7/28. Was 6.4 on recent admission.   She is seen in HD. Difficult historian - falls asleep during interview. Reports compliance w/ her pantoprazole 40mg  BID at home. She denies any additional NSAID. Reports a combination of melena and BRBPR. Melena seems more prominent than the BRB. She reports yesterday's stool had melena. Mild abd pain. No melena episodes today. No nausea/vomiting. Just had Xanax prior to visit   Last Colonoscopy: 08/2015 - Poor prep - 9 mm polyp ascending, 8 mm polyp sigmoid,  stool in entire colon, diverticulosis in sigmoid.   Last Endoscopy: 05/2015 - see above.    Past Medical History:  Past Medical History:  Diagnosis Date  . Anemia   . Apnea, sleep    for sleep study 10/17/15-no cpap yet  . Arthritis   . Bronchitis   . Chronic kidney disease   . COPD (chronic obstructive pulmonary disease) ([place])   . Depression   . Dialysis patient ([place]) 2014  . GERD (gastroesophageal reflux disease)   . Headache   . Hyperlipidemia   . Hypertension   . Obesity   . Renal dialysis device, implant, or graft complication    RIGHT CHEST CATH    Problem List: Patient Active Problem List   Diagnosis Date Noted  . GIB (gastrointestinal bleeding) 07/09/2016  . Protein-calorie malnutrition, severe 05/29/2016  . Hyperglycemia 05/28/2016  . Pain in limb 10/28/2015  . End stage renal  disease ([place]) 10/28/2015  . Essential hypertension 10/28/2015  . ESRD on hemodialysis ([place]) 03/29/2015  . HTN (hypertension) 03/29/2015  . COPD (chronic obstructive pulmonary disease) ([place]) 03/29/2015  . GAD (generalized anxiety disorder) 03/29/2015    Past Surgical History: Past Surgical History:  Procedure Laterality Date  . ABDOMINAL HYSTERECTOMY    . COLONOSCOPY  2011  . COLONOSCOPY WITH PROPOFOL N/A 09/20/2015   Procedure: COLONOSCOPY WITH PROPOFOL;  Surgeon: Lucilla Lame, MD;  Location: [facility] ENDOSCOPY;  Service: Endoscopy;  Laterality: N/A;  . DIALYSIS FISTULA CREATION    . ESOPHAGOGASTRODUODENOSCOPY (EGD) WITH PROPOFOL N/A 06/02/2016   Procedure: ESOPHAGOGASTRODUODENOSCOPY (EGD) WITH PROPOFOL;  Surgeon: Manya Silvas, MD;  Location[facility] ENDOSCOPY;  Service: Endoscopy;  Laterality: N/A;  . PERIPHERAL VASCULAR CATHETERIZATION N/A 05/10/2015   Procedure: A/V Shuntogram/Fistulagram;  Surgeon: Katha Cabal, MD;  Location: [place] CV LAB;  Service: Cardiovascular;  Laterality: N/A;  . PERIPHERAL VASCULAR CATHETERIZATION Left 05/10/2015   Procedure: A/V Shunt Intervention;  Surgeon: Katha Cabal, MD;  Location: [place] [facility] LAB;  Service: Cardiovascular;  Laterality: Left;  . PERIPHERAL VASCULAR CATHETERIZATION Left 08/23/2015   Procedure: A/V Shuntogram/Fistulagram;  Surgeon: Katha Cabal, MD;  Location: [place] [facility];  Service: Cardiovascular;  Laterality: Left;  . PERIPHERAL VASCULAR CATHETERIZATION N/A 08/23/2015   Procedure: A/V Shunt Intervention;  Surgeon: Katha Cabal, MD;  Location: [place] [facility] LAB;  Service: Cardiovascular;  Laterality: N/A;  .  PERIPHERAL VASCULAR CATHETERIZATION  08/23/2015   Procedure: Dialysis/Perma Catheter Insertion;  Surgeon: Katha Cabal, MD;  Location: [place] [facility];  Service: Cardiovascular;;  . PERIPHERAL VASCULAR CATHETERIZATION N/A 01/03/2016   Procedure: Dialysis/Perma Catheter Removal;  Surgeon:  Katha Cabal, MD;  Location: [place] [facility];  Service: Cardiovascular;  Laterality: N/A;  . REVISON OF ARTERIOVENOUS FISTULA Left 10/28/2015   Procedure: REVISON OF ARTERIOVENOUS FISTULA WITH ARTEGRAFT;  Surgeon: Katha Cabal, MD;  Location: [facility] ORS;  Service: Vascular;  Laterality: Left;  . WOUND DEBRIDEMENT Left 10/28/2015   Procedure: Resection of shoulder cyst ( left );  Surgeon: Katha Cabal, MD;  Location: [facility] ORS;  Service: Vascular;  Laterality: Left;    Allergies: Allergies  Allergen Reactions  . Sulfa Antibiotics Itching, Swelling, Rash and Other (See Comments)    Reaction:  Facial/body swelling     Home Medications: Prescriptions Prior to Admission  Medication Sig Dispense Refill Last Dose  . albuterol (PROVENTIL HFA;VENTOLIN HFA) 108 (90 BASE) MCG/ACT inhaler Inhale 2 puffs into the lungs every 6 (six) hours as needed for wheezing or shortness of breath.   07/09/2016 at 0930  . ALPRAZolam (XANAX) 0.5 MG tablet Take 0.5 mg by mouth 2 (two) times daily.   07/09/2016 at 0930  . amLODipine (NORVASC) 10 MG tablet Take 10 mg by mouth at bedtime.   07/09/2016 at 0930  . calcium acetate (PHOSLO) 667 MG capsule Take 667-1,334 mg by mouth See admin instructions. Pt takes two capsules three times daily with meals and one capsule two times daily with snacks.   07/09/2016 at 0930  . calcium carbonate (OS-CAL) 600 MG TABS tablet Take 600 mg by mouth 2 (two) times daily with a meal.   07/09/2016 at Unknown time  . carvedilol (COREG) 25 MG tablet Take 1 tablet (25 mg total) by mouth 2 (two) times daily with a meal. (Patient taking differently: Take 12.5 mg by mouth 2 (two) times daily with a meal. ) 60 tablet 0 07/09/2016 at 0930  . cinacalcet (SENSIPAR) 30 MG tablet Take 60 mg by mouth daily with breakfast.    07/09/2016 at Unknown time  . ezetimibe (ZETIA) 10 MG tablet Take 10 mg by mouth daily.    07/09/2016 at Unknown time  . fluticasone (FLONASE) 50 MCG/ACT nasal spray Place 2  sprays into both nostrils daily as needed for rhinitis.   07/09/2016 at Unknown time  . Fluticasone-Salmeterol (ADVAIR) 250-50 MCG/DOSE AEPB Inhale 1 puff into the lungs 2 (two) times daily.   07/09/2016 at Unknown time  . furosemide (LASIX) 40 MG tablet Take 40 mg by mouth every Tuesday, Thursday, Saturday, and Sunday.    07/09/2016 at Unknown time  . losartan (COZAAR) 100 MG tablet Take 100 mg by mouth daily.    07/09/2016 at Unknown time  . multivitamin (RENA-VIT) TABS tablet Take 1 tablet by mouth at bedtime.    07/08/2016 at pm  . pantoprazole (PROTONIX) 40 MG tablet Take 1 tablet (40 mg total) by mouth 2 (two) times daily. 60 tablet 0 07/09/2016 at 0930  . sertraline (ZOLOFT) 50 MG tablet Take 50 mg by mouth daily.    07/09/2016 at Unknown time  . lidocaine-prilocaine (EMLA) cream Apply 1 application topically as needed (prior to accessing port).   prn at prn   Home medication reconciliation was completed with the patient.   Scheduled Inpatient Medications:   . ALPRAZolam  0.5 mg Oral BID  . amLODipine  10 mg Oral  QHS  . calcium acetate  1,334 mg Oral TID WC  . calcium acetate  667 mg Oral BID  . calcium carbonate  500 mg Oral BID WC  . carvedilol  25 mg Oral BID WC  . cinacalcet  60 mg Oral Q breakfast  . ezetimibe  10 mg Oral Daily  . furosemide  40 mg Oral Q T,Th,S,Su  . losartan  100 mg Oral Q T,Th,S,Su  . mometasone-formoterol  2 puff Inhalation BID  . multivitamin  1 tablet Oral QHS  . sertraline  50 mg Oral Daily    Continuous Inpatient Infusions:   . pantoprozole (PROTONIX) infusion 8 mg/hr (07/11/16 0422)    PRN Inpatient Medications:  acetaminophen **OR** acetaminophen, albuterol, fluticasone, naLOXone (NARCAN)  injection, ondansetron **OR** ondansetron (ZOFRAN) IV  Family History: family history includes Cancer in her father and sister; Heart disease in her mother.  The patient's family history is negative for inflammatory bowel disorders, GI malignancy, or solid organ  transplantation.  Social History:   reports that she quit smoking about 19 months ago. Her smoking use included Cigarettes. She has a 20.00 pack-year smoking history. She has never used smokeless tobacco. She reports that she does not drink alcohol or use drugs. The patient denies ETOH, tobacco, or drug use.   Review of Systems: Unable to obtain   Physical Examination: BP (!) 148/81 (BP Location: Right Arm)   Pulse 70   Temp 98.3 F (36.8 C) (Oral)   Resp 18   Ht 5\' 5"  (1.651 m)   Wt 60.9 kg (134 lb 3.2 oz)   SpO2 98%   BMI 22.33 kg/m  Gen: NAD, falls asleep multiple times during interview. Receiving dialysis.  HEENT: PEERLA, EOMI, Neck: supple, no JVD or thyromegaly Chest: CTA bilaterally, no wheezes, crackles, or other adventitious sounds CV: RRR, no m/g/c/r Abd: soft, NT, ND, +BS in all four quadrants; no HSM, guarding, ridigity, or rebound tenderness Ext: no edema, well perfused with 2+ pulses, Skin: no rash or lesions noted Lymph: no LAD  Data: Lab Results  Component Value Date   WBC 8.9 07/11/2016   HGB 7.4 (L) 07/11/2016   HCT 23.5 (L) 07/11/2016   MCV 99.6 07/11/2016   PLT 244 07/11/2016    Recent Labs Lab 07/10/16 1123 07/10/16 2005 07/11/16 0417  HGB 7.2* 8.0* 7.4*   Lab Results  Component Value Date   NA 139 07/11/2016   K 4.1 07/11/2016   CL 99 (L) 07/11/2016   CO2 26 07/11/2016   BUN 34 (H) 07/11/2016   CREATININE 7.18 (H) 07/11/2016   Lab Results  Component Value Date   ALT 18 07/09/2016   AST 28 07/09/2016   ALKPHOS 128 (H) 07/09/2016   BILITOT 0.4 07/09/2016   No results for input(s): APTT, INR, PTT in the last 168 hours. Assessment/Plan:  Ms. Stauch is a 60 y.o. female admitted for weakness and melena.   1. Anemia - borderline macrocytic, requiring multiple transfusions in past 2 months. EGD remarkable for small duodenal ulcer 8 weeks ago. She reports compliance with PPI and denies NSAID use but unsure how reliable history is. Given  melena has returned will arrange for EGD tomorrow. NPO after midnight. Continue monitoring Hgb (has improved after 2 units PRBC and PPI gtt). No biopsies on recent EGD-check H. Pylori.    Recommendations: 1. EGD tomorrow 2. Continue PPI gtt 3. Monitor H/h, transfuse as needed.  4. Check H. Pylori stool antigen with next stool.    *Case  discussed w/ Dr. Gustavo Lah.   Thank you for the consult. Please call with questions or concerns.  Ronney Asters, PA-C [place]

## 2016-07-11 NOTE — Progress Notes (Signed)
POST HD ASSESSMENT 

## 2016-07-11 NOTE — Progress Notes (Signed)
[facility] [place] NAME: Michele Nash    MR#:  [id number]  DATE OF BIRTH:  1956/02/16  SUBJECTIVE:   Pt. Here due to melanotic stools.  Hg. Stable this a.m. STill had dark stools today. Seen at HD today.  No other events overnight.  More awake today.   REVIEW OF SYSTEMS:    Review of Systems  Constitutional: Negative for chills and fever.  HENT: Negative for congestion and tinnitus.   Eyes: Negative for blurred vision and double vision.  Respiratory: Negative for cough, shortness of breath and wheezing.   Cardiovascular: Negative for chest pain, orthopnea and PND.  Gastrointestinal: Positive for melena. Negative for abdominal pain, diarrhea, nausea and vomiting.  Genitourinary: Negative for dysuria and hematuria.  Neurological: Negative for dizziness, sensory change and focal weakness.  All other systems reviewed and are negative.   Nutrition: Clear Liquid diet Tolerating Diet: Yes Tolerating PT: Await Eval.    DRUG ALLERGIES:   Allergies  Allergen Reactions  . Sulfa Antibiotics Itching, Swelling, Rash and Other (See Comments)    Reaction:  Facial/body swelling     VITALS:  Blood pressure (!) 151/59, pulse 68, temperature 98 F (36.7 C), temperature source Oral, resp. rate (!) 22, height 5\' 5"  (1.651 m), weight 68.7 kg (151 lb 7.3 oz), SpO2 97 %.  PHYSICAL EXAMINATION:   Physical Exam  GENERAL:  60 y.o.-year-old patient lying in bed at HD in NAD.   EYES: PERRL.  No scleral icterus. Extraocular muscles intact.  HEENT: Head atraumatic, normocephalic. Oropharynx and nasopharynx clear.  NECK:  Supple, no jugular venous distention. No thyroid enlargement, no tenderness.  LUNGS: Normal breath sounds bilaterally, no wheezing, rales, rhonchi. No use of accessory muscles of respiration.  CARDIOVASCULAR: S1, S2 normal. No murmurs, rubs, or gallops.  ABDOMEN: Soft, nontender, nondistended. Bowel sounds present. No organomegaly or  mass.  EXTREMITIES: No cyanosis, clubbing or edema b/l.    NEUROLOGIC: Cranial nerves II through XII are intact. No focal Motor or sensory deficits b/l. Globally weak.     PSYCHIATRIC: The patient is alert and oriented x 1.  SKIN: No obvious rash, lesion, or ulcer.   Left upper ext. AV fistula with good bruit/thrill.    LABORATORY PANEL:   CBC  Recent Labs Lab 07/11/16 0417  WBC 8.9  HGB 7.4*  HCT 23.5*  PLT 244   ------------------------------------------------------------------------------------------------------------------  Chemistries   Recent Labs Lab 07/09/16 1642 07/11/16 0417  NA 139 139  K 3.1* 4.1  CL 96* 99*  CO2 33* 26  GLUCOSE 109* 111*  BUN 19 34*  CREATININE 4.16* 7.18*  CALCIUM 7.3* 6.8*  AST 28  --   ALT 18  --   ALKPHOS 128*  --   BILITOT 0.4  --    ------------------------------------------------------------------------------------------------------------------  Cardiac Enzymes  Recent Labs Lab 07/09/16 1642  TROPONINI 0.05*   ------------------------------------------------------------------------------------------------------------------  RADIOLOGY:  No results found.   ASSESSMENT AND PLAN:   60 year old female with past medical history of end-stage renal disease on hemodialysis, hypertension, hyperlipidemia, GERD, COPD, obstructive sleep apnea, chronic anemia who presents to the hospital due to melanotic stools.  1. GI bleed-patient presented to the hospital with melanotic stools. She has a previous history of duodenal erosions. -Hemoglobin improved with transfusion. Hg. 7.4 today.  - cont. Protonix gtt.  - discussed with GI and plan for Endoscopy tomorrow. Cont. Clear liquid diet for now.   2. Acute blood loss Anemia - secondary to the  GI bleed. - Hg. Stable and will monitor.  Transfuse if Hg. < 7.    3. Altered mental status-metabolic encephalopathy secondary to pain medications/Oxycodone - got some Narcan yesterday and  much improved today.   - d/c Oxy for now.    4. Anxiety-continue Xanax as needed.  5. End-stage renal disease on hemodialysis-nephrology has been consulted. Continue dialysis Monday Wednesday Friday as per them.  6. Secondary hyperparathyroidism-continue PhosLo, Sensipar.  7. Essential hypertension-continue Norvasc, carvedilol, losartan.  8. Depression-continue Zoloft.  9. COPD-no acute exacerbation - continue Dulera.   All the records are reviewed and case discussed with Care Management/Social Workerr. Management plans discussed with the patient, family and they are in agreement.  CODE STATUS: Full  DVT Prophylaxis: TEd's & SCD's  TOTAL TIME TAKING CARE OF THIS PATIENT: 25 minutes.   POSSIBLE D/C IN 1-2 DAYS, DEPENDING ON CLINICAL CONDITION.   Henreitta Leber M.D on 07/11/2016 at 2:12 PM  Between 7am to 6pm - Pager - [phone]  After 6pm go to [email or web address] - password [email or web address] [facility]  Office  [phone]  CC: Primary care physician; [name], MD

## 2016-07-11 NOTE — Progress Notes (Signed)
PRE HD INFO 

## 2016-07-12 ENCOUNTER — Encounter
Admission: EM | Disposition: A | Discharge status: Home / Self Care | Payer: Self-pay | Source: Home / Self Care | Attending: Specialist

## 2016-07-12 LAB — CBC
HCT: 23.2 % — ABNORMAL LOW (ref 35.0–47.0)
Hemoglobin: 8 g/dL — ABNORMAL LOW (ref 12.0–16.0)
MCH: 33.4 pg (ref 26.0–34.0)
MCHC: 34.3 g/dL (ref 32.0–36.0)
MCV: 97.5 fL (ref 80.0–100.0)
PLATELETS: 239 10*3/uL (ref 150–440)
RBC: 2.38 MIL/uL — ABNORMAL LOW (ref 3.80–5.20)
RDW: 17.7 % — AB (ref 11.5–14.5)
WBC: 7.5 10*3/uL (ref 3.6–11.0)

## 2016-07-12 SURGERY — ESOPHAGOGASTRODUODENOSCOPY (EGD) WITH PROPOFOL
Anesthesia: General

## 2016-07-12 MED ORDER — SODIUM CHLORIDE 0.9 % IV SOLN: INTRAVENOUS | Status: DC

## 2016-07-12 MED ORDER — PANTOPRAZOLE SODIUM 40 MG PO TBEC
40.0000 mg | DELAYED_RELEASE_TABLET | Freq: Two times a day (BID) | ORAL | Status: DC
  Administered 2016-07-12 – 2016-07-13 (×3): 40 mg via ORAL
  Filled 2016-07-12 (×3): qty 1

## 2016-07-12 NOTE — Progress Notes (Signed)
Patient is alert and oriented, sleeping in between care, patient ordered to be NPO for endoscopy at start of shift, significant other brought food from home, patient found eating food from home prior to endoscopy, patient defensive at that time and reports that she "only had a bite", MD and Endo RN notified and endoscopy canceled, patient expressed regret for not following orders appropriately. Regular diet initiated, good appetite, pt is to be NPO after midnight for endo in am. Pt is on 3 L oxygen as at home, denies pain, up to bathroom independently, no bm this shift. IV Protonix discontinued and po Protonix initiated.

## 2016-07-12 NOTE — Progress Notes (Signed)
[facility] NAME: Michele Nash    MR#:  [id number]  DATE OF BIRTH:  [birth year]  SUBJECTIVE:   Pt. Here due to melanotic stools.  Hg. Stable this a.m. Pt. Was supposed to get endoscopy today but it got cancelled as pt. Ate a biscuit this a.m.  No other complaints presently.    REVIEW OF SYSTEMS:    Review of Systems  Constitutional: Negative for chills and fever.  HENT: Negative for congestion and tinnitus.   Eyes: Negative for blurred vision and double vision.  Respiratory: Negative for cough, shortness of breath and wheezing.   Cardiovascular: Negative for chest pain, orthopnea and PND.  Gastrointestinal: Positive for melena. Negative for abdominal pain, diarrhea, nausea and vomiting.  Genitourinary: Negative for dysuria and hematuria.  Neurological: Negative for dizziness, sensory change and focal weakness.  All other systems reviewed and are negative.   Nutrition: Clear Liquid diet Tolerating Diet: Yes Tolerating PT: Await Eval.     DRUG ALLERGIES:   Allergies  Allergen Reactions  . Sulfa Antibiotics Itching, Swelling, Rash and Other (See Comments)    Reaction:  Facial/body swelling     VITALS:  Blood pressure 106/66, pulse 82, temperature 98.7 F (37.1 C), temperature source Oral, resp. rate 20, height 5\' 5"  (1.651 m), weight 66.6 kg (146 lb 13.2 oz), SpO2 94 %.  PHYSICAL EXAMINATION:   Physical Exam  GENERAL:  60 y.o.-year-old patient lying in bed in NAD.   EYES: PERRL.  No scleral icterus. Extraocular muscles intact.  HEENT: Head atraumatic, normocephalic. Oropharynx and nasopharynx clear.  NECK:  Supple, no jugular venous distention. No thyroid enlargement, no tenderness.  LUNGS: Normal breath sounds bilaterally, no wheezing, rales, rhonchi. No use of accessory muscles of respiration.  CARDIOVASCULAR: S1, S2 normal. No murmurs, rubs, or gallops.  ABDOMEN: Soft, nontender, nondistended. Bowel sounds present.  No organomegaly or mass.  EXTREMITIES: No cyanosis, clubbing or edema b/l.    NEUROLOGIC: Cranial nerves II through XII are intact. No focal Motor or sensory deficits b/l. Globally weak.     PSYCHIATRIC: The patient is alert and oriented x 3.   SKIN: No obvious rash, lesion, or ulcer.   Left upper ext. AV fistula with good bruit/thrill.    LABORATORY PANEL:   CBC  Recent Labs Lab 07/12/16 0323  WBC 7.5  HGB 8.0*  HCT 23.2*  PLT 239   ------------------------------------------------------------------------------------------------------------------  Chemistries   Recent Labs Lab 07/09/16 1642 07/11/16 0417  NA 139 139  K 3.1* 4.1  CL 96* 99*  CO2 33* 26  GLUCOSE 109* 111*  BUN 19 34*  CREATININE 4.16* 7.18*  CALCIUM 7.3* 6.8*  AST 28  --   ALT 18  --   ALKPHOS 128*  --   BILITOT 0.4  --    ------------------------------------------------------------------------------------------------------------------  Cardiac Enzymes  Recent Labs Lab 07/09/16 1642  TROPONINI 0.05*   ------------------------------------------------------------------------------------------------------------------  RADIOLOGY:  No results found.   ASSESSMENT AND PLAN:   60 year old female with past medical history of end-stage renal disease on hemodialysis, hypertension, hyperlipidemia, GERD, COPD, obstructive sleep apnea, chronic anemia who presents to the hospital due to melanotic stools.  1. GI bleed-patient presented to the hospital with melanotic stools. She has a previous history of duodenal erosions. -Hemoglobin improved with transfusion. Hg. 8 today.  - d/c Protonix gtt and will placed on PPI BID.   - pt. Was supposed to get Endoscopy today but it got cancelled as pt. Ate  a biscuit this a.m. Discussed w/ GI and will plan to arrange Endo in a.m. Tomorrow.   2. Acute blood loss Anemia - secondary to the GI bleed. - Hg. Stable and will monitor.  Transfuse if Hg. < 7.    3. Altered  mental status-metabolic encephalopathy secondary to pain medications/Oxycodone - got some Narcan and back to baseline now.    - d/c Oxy for now.    4. Anxiety-continue Xanax as needed.  5. End-stage renal disease on hemodialysis-nephrology has been consulted. Continue dialysis Monday Wednesday Friday as per them.  6. Secondary hyperparathyroidism-continue PhosLo, Sensipar.  7. Essential hypertension-continue Norvasc, carvedilol, losartan.  8. Depression-continue Zoloft.  9. COPD-no acute exacerbation - continue Dulera.   All the records are reviewed and case discussed with Care Management/Social Workerr. Management plans discussed with the patient, family and they are in agreement.  CODE STATUS: Full  DVT Prophylaxis: TEd's & SCD's  TOTAL TIME TAKING CARE OF THIS PATIENT: 30 minutes.   POSSIBLE D/C IN 1-2 DAYS, DEPENDING ON CLINICAL CONDITION.   Henreitta Leber M.D on 07/12/2016 at 1:51 PM  Between 7am to 6pm - Pager - [phone]  After 6pm go to [email or web address] - password [email or web address]  Office  [phone]  CC: Primary care physician; [name], MD

## 2016-07-12 NOTE — Progress Notes (Signed)
Notified Dr. Verdell Carmine that endoscopy was canceled because patient ate food brought in from visitor, per MD okay to place patient on regular diet for today. Verbal telephone.

## 2016-07-12 NOTE — Progress Notes (Signed)
[facility]  ROUNDING NOTE   Subjective:  Patient completed hemodialysis yesterday. Next line she was a bit agitated yesterday. Overall she's feeling better today. She is due for endoscopy today.    Objective:  Vital signs in last 24 hours:  Temp:  [97.3 F (36.3 C)-99 F (37.2 C)] 97.3 F (36.3 C) (08/17 0513) Pulse Rate:  [57-90] 57 (08/17 0513) Resp:  [15-26] 19 (08/17 0513) BP: (114-193)/(59-94) 169/65 (08/17 0513) SpO2:  [97 %-100 %] 97 % (08/17 0513) Weight:  [66.6 kg (146 lb 13.2 oz)-68.7 kg (151 lb 7.3 oz)] 66.6 kg (146 lb 13.2 oz) (08/16 1612)  Weight change:  Filed Weights   07/09/16 2020 07/11/16 1230 07/11/16 1612  Weight: 60.9 kg (134 lb 3.2 oz) 68.7 kg (151 lb 7.3 oz) 66.6 kg (146 lb 13.2 oz)    Intake/Output: I/O last 3 completed shifts: In: [id number] [P.O.:1560; I.V.:64] Out: 1845 [Urine:345; Other:1500]   Intake/Output this shift:  Total I/O In: -  Out: 100 [Urine:100]  Physical Exam: General: No acute distress  Head: Normocephalic, atraumatic. Moist oral mucosal membranes  Eyes: Anicteric  Neck: Supple, trachea midline  Lungs:  Clear to auscultation, normal effort  Heart: S1S2 no rubs  Abdomen:  Soft, nontender, Bowel sounds present   Extremities: No peripheral edema.  Neurologic: Awake, alert, follows commands   Skin: No lesions  Access: LUE AVF    Basic Metabolic Panel:  Recent Labs Lab 07/09/16 1642 07/11/16 0417 07/11/16 0428  NA 139 139  --   K 3.1* 4.1  --   CL 96* 99*  --   CO2 33* 26  --   GLUCOSE 109* 111*  --   BUN 19 34*  --   CREATININE 4.16* 7.18*  --   CALCIUM 7.3* 6.8*  --   PHOS  --   --  5.5*    Liver Function Tests:  Recent Labs Lab 07/09/16 1642  AST 28  ALT 18  ALKPHOS 128*  BILITOT 0.4  PROT 6.4*  ALBUMIN 3.3*   No results for input(s): LIPASE, AMYLASE in the last 168 hours. No results for input(s): AMMONIA in the last 168 hours.  CBC:  Recent Labs Lab 07/10/16 0518 07/10/16 1123  07/10/16 2005 07/11/16 0417 07/12/16 0323  WBC 11.1* 9.4 10.2 8.9 7.5  HGB 6.9* 7.2* 8.0* 7.4* 8.0*  HCT 21.2* 22.2* 25.0* 23.5* 23.2*  MCV 98.4 100.0 99.9 99.6 97.5  PLT 242 257 235 244 239    Cardiac Enzymes:  Recent Labs Lab 07/09/16 1642  TROPONINI 0.05*    BNP: Invalid input(s): POCBNP  CBG: No results for input(s): GLUCAP in the last 168 hours.  Microbiology: Results for orders placed or performed during the hospital encounter of 05/28/16  MRSA PCR Screening     Status: None   Collection Time: 05/30/16 12:09 PM  Result Value Ref Range Status   MRSA by PCR NEGATIVE NEGATIVE Final    Comment:        The GeneXpert MRSA Assay (FDA approved for NASAL specimens only), is one component of a comprehensive MRSA colonization surveillance program. It is not intended to diagnose MRSA infection nor to guide or monitor treatment for MRSA infections.     Coagulation Studies: No results for input(s): LABPROT, INR in the last 72 hours.  Urinalysis: No results for input(s): COLORURINE, LABSPEC, PHURINE, GLUCOSEU, HGBUR, BILIRUBINUR, KETONESUR, PROTEINUR, UROBILINOGEN, NITRITE, LEUKOCYTESUR in the last 72 hours.  Invalid input(s): APPERANCEUR    Imaging: No results found.  Medications:       Assessment/ Plan:  60 y.o. female with pmhx of HTN, hyperlipidemia, diastolic heart failure, hypertension, COPD, tobacco abuse, AOCD, SHPTH, LUE AVF, ESRD 02/2013, admission for severe anemia/black stools 05/28/16.  [facility]/MWF  1. End-stage renal disease on hemodialysis MWF.  Patient completed hemodialysis yesterday. She is due for dialysis again tomorrow.  2. Anemia of chronic kidney disease/GI bleed. Patient continues to have black tarry stools. She has a known duodenal ulcer.  - Patient status post transfusion.  Hemoglobin up to 8.0. Due for endoscopy today.  3. Secondary hyperparathyroidism.  Phosphorus 5.5 at last check. Continue to monitor bone mineral  metabolism parameters.   LOS: 3 Asalee Barrette 8/17/201712:10 PM

## 2016-07-12 NOTE — Progress Notes (Signed)
Endo nurse notified that around approximately 11:00 am patient was found with a biscuit, per patient she only had one bite.

## 2016-07-12 NOTE — Consult Note (Signed)
Subjective: Patient seen for melena. Patient was scheduled for an EGD this morning however she ate some solids this morning and we had to cancel the procedure. Patient denies any abdominal pain or further black stools.  Objective: Vital signs in last 24 hours: Temp:  [97.3 F (36.3 C)-99 F (37.2 C)] 98.7 F (37.1 C) (08/17 1300) Pulse Rate:  [57-82] 82 (08/17 1300) Resp:  [17-26] 20 (08/17 1300) BP: (106-193)/(65-84) 106/66 (08/17 1300) SpO2:  [94 %-100 %] 94 % (08/17 1300) Weight:  [66.6 kg (146 lb 13.2 oz)] 66.6 kg (146 lb 13.2 oz) (08/16 1612) Blood pressure 106/66, pulse 82, temperature 98.7 F (37.1 C), temperature source Oral, resp. rate 20, height 5\' 5"  (1.651 m), weight 66.6 kg (146 lb 13.2 oz), SpO2 94 %.   Intake/Output from previous day: 08/16 0701 - 08/17 0700 In: 1624 [P.O.:1560; I.V.:64] Out: 1670 [Urine:170]  Intake/Output this shift: Total I/O In: 240 [P.O.:240] Out: 300 [Urine:300]   General appearance:  61 female no distress Resp:  Bilaterally clear to auscultation Cardio:  Regular rate and rhythm without rub or gallop GI:  Soft nontender nondistended bowel sounds positive normoactive Extremities:     Lab Results: Results for orders placed or performed during the hospital encounter of 07/09/16 (from the past 24 hour(s))  CBC     Status: Abnormal   Collection Time: 07/12/16  3:23 AM  Result Value Ref Range   WBC 7.5 3.6 - 11.0 K/uL   RBC 2.38 (L) 3.80 - 5.20 MIL/uL   Hemoglobin 8.0 (L) 12.0 - 16.0 g/dL   HCT 23.2 (L) 35.0 - 47.0 %   MCV 97.5 80.0 - 100.0 fL   MCH 33.4 26.0 - 34.0 pg   MCHC 34.3 32.0 - 36.0 g/dL   RDW 17.7 (H) 11.5 - 14.5 %   Platelets 239 150 - 440 K/uL      Recent Labs  07/10/16 2005 07/11/16 0417 07/12/16 0323  WBC 10.2 8.9 7.5  HGB 8.0* 7.4* 8.0*  HCT 25.0* 23.5* 23.2*  PLT 235 244 239   BMET  Recent Labs  07/09/16 1642 07/11/16 0417  NA 139 139  K 3.1* 4.1  CL 96* 99*  CO2 33* 26  GLUCOSE 109* 111*  BUN 19  34*  CREATININE 4.16* 7.18*  CALCIUM 7.3* 6.8*   LFT  Recent Labs  07/09/16 1642  PROT 6.4*  ALBUMIN 3.3*  AST 28  ALT 18  ALKPHOS 128*  BILITOT 0.4   PT/INR No results for input(s): LABPROT, INR in the last 72 hours. Hepatitis Panel No results for input(s): HEPBSAG, HCVAB, HEPAIGM, HEPBIGM in the last 72 hours. C-Diff No results for input(s): CDIFFTOX in the last 72 hours. No results for input(s): CDIFFPCR in the last 72 hours.   Studies/Results: No results found.  Scheduled Inpatient Medications:   . ALPRAZolam  0.5 mg Oral BID  . amLODipine  10 mg Oral QHS  . calcium acetate  1,334 mg Oral TID WC  . calcium acetate  667 mg Oral BID  . calcium carbonate  500 mg Oral BID WC  . carvedilol  25 mg Oral BID WC  . cinacalcet  60 mg Oral Q breakfast  . ezetimibe  10 mg Oral Daily  . furosemide  40 mg Oral Q T,Th,S,Su  . losartan  100 mg Oral Q T,Th,S,Su  . mometasone-formoterol  2 puff Inhalation BID  . multivitamin  1 tablet Oral QHS  . pantoprazole  40 mg Oral BID  . sertraline  50 mg Oral Daily    Continuous Inpatient Infusions:     PRN Inpatient Medications:  acetaminophen **OR** acetaminophen, albuterol, fluticasone, naLOXone (NARCAN)  injection, ondansetron **OR** ondansetron (ZOFRAN) IV  Miscellaneous:   Assessment:  1. Melena, recurrent, in the setting of previous history of recent EGD showing gastritis, duodenitis, duodenal ulcer and chronic renal patient seen on hemodialysis. Hemodynamically stable. Hemogram has been stable. There is been no evidence of recurrent melena since readmission.  Plan:  1. Case discussed with Dr. Verdell Carmine. I have records arrange for her to be seen in endoscopy for procedure first case tomorrow morning. Continue current medications.  Lollie Sails MD 07/12/2016, 3:39 PM

## 2016-07-13 ENCOUNTER — Inpatient Hospital Stay: Payer: Medicare Other | Admitting: Anesthesiology

## 2016-07-13 ENCOUNTER — Encounter
Admission: EM | Disposition: A | Discharge status: Home / Self Care | Payer: Self-pay | Source: Home / Self Care | Attending: Specialist

## 2016-07-13 HISTORY — PX: ESOPHAGOGASTRODUODENOSCOPY: SHX5428

## 2016-07-13 LAB — CBC
HCT: 22 % — ABNORMAL LOW (ref 35.0–47.0)
Hemoglobin: 7.4 g/dL — ABNORMAL LOW (ref 12.0–16.0)
MCH: 33.1 pg (ref 26.0–34.0)
MCHC: 33.7 g/dL (ref 32.0–36.0)
MCV: 98.3 fL (ref 80.0–100.0)
PLATELETS: 237 10*3/uL (ref 150–440)
RBC: 2.24 MIL/uL — AB (ref 3.80–5.20)
RDW: 16.8 % — AB (ref 11.5–14.5)
WBC: 6.7 10*3/uL (ref 3.6–11.0)

## 2016-07-13 SURGERY — EGD (ESOPHAGOGASTRODUODENOSCOPY)
Anesthesia: General

## 2016-07-13 SURGERY — ESOPHAGOGASTRODUODENOSCOPY (EGD) WITH PROPOFOL
Anesthesia: General

## 2016-07-13 MED ORDER — PROPOFOL 500 MG/50ML IV EMUL
INTRAVENOUS | Status: DC | PRN
  Administered 2016-07-13: 120 ug/kg/min via INTRAVENOUS

## 2016-07-13 MED ORDER — SODIUM CHLORIDE 0.9 % IV SOLN: INTRAVENOUS | Status: DC

## 2016-07-13 MED ORDER — FENTANYL CITRATE (PF) 100 MCG/2ML IJ SOLN
INTRAMUSCULAR | Status: DC | PRN
  Administered 2016-07-13: 50 ug via INTRAVENOUS

## 2016-07-13 MED ORDER — NYSTATIN 100000 UNIT/ML MT SUSP: 5.0000 mL | Freq: Four times a day (QID) | OROMUCOSAL | 0 refills | Status: AC

## 2016-07-13 MED ORDER — PROPOFOL 10 MG/ML IV BOLUS
INTRAVENOUS | Status: DC | PRN
  Administered 2016-07-13: 20 mg via INTRAVENOUS

## 2016-07-13 MED ORDER — NYSTATIN 100000 UNIT/ML MT SUSP
5.0000 mL | Freq: Four times a day (QID) | OROMUCOSAL | Status: DC
  Administered 2016-07-13: 10:00:00 500000 [IU] via OROMUCOSAL
  Filled 2016-07-13 (×2): qty 5

## 2016-07-13 NOTE — Transfer of Care (Signed)
Immediate Anesthesia Transfer of Care Note  Patient: Michele Nash  Procedure(s) Performed: Procedure(s): ESOPHAGOGASTRODUODENOSCOPY (EGD) (N/A)  Patient Location: PACU  Anesthesia Type:General  Level of Consciousness: sedated  Airway & Oxygen Therapy: Patient connected to face mask oxygen  Post-op Assessment: Report given to RN  Post vital signs: Reviewed  Last Vitals:  Vitals:   07/13/16 0437 07/13/16 0656  BP: 137/67 (!) 159/80  Pulse: 66 66  Resp:  16  Temp: 36.6 C 36.3 C    Last Pain:  Vitals:   07/13/16 0656  TempSrc: Tympanic  PainSc:          Complications: No apparent anesthesia complications

## 2016-07-13 NOTE — Care Management (Signed)
Spoke with Dr. Verdell Carmine this morning. Will be discharging to home this afternoon following dialysis, if stable. Shelbie Ammons RN MSN CCM Care Management [phone]

## 2016-07-13 NOTE — Progress Notes (Signed)
This note also relates to the following rows which could not be included: Pulse Rate - Cannot attach notes to unvalidated device data Resp - Cannot attach notes to unvalidated device data BP - Cannot attach notes to unvalidated device data SpO2 - Cannot attach notes to unvalidated device data  HD START

## 2016-07-13 NOTE — Op Note (Signed)
[facility] Patient Name: Michele Nash Procedure Date: 07/13/2016 7:39 AM MRN: [id number] Account #: [id number] Date of Birth: December 04, 1955 Admit Type: Outpatient Age: 60 Room: [facility] ENDO ROOM 1 Gender: Female Note Status: Finalized Procedure:            Upper GI endoscopy Indications:          Melena Providers:            Lollie Sails, MD Referring MD:         [name], MD (Referring MD) Medicines:            Monitored Anesthesia Care Complications:        No immediate complications. Procedure:            Pre-Anesthesia Assessment:                       - ASA Grade Assessment: III - A patient with severe                        systemic disease.                       After obtaining informed consent, the endoscope was                        passed under direct vision. Throughout the procedure,                        the patient's blood pressure, pulse, and oxygen                        saturations were monitored continuously. The Endoscope                        was introduced through the mouth, and advanced to the                        prepyloric region, stomach. The upper GI endoscopy was                        unusually difficult due to the patient's oxygen                        desaturation. Successful completion of the procedure                        was aided by performing chin lift, administering oxygen                        and treating with ventilation. The patient tolerated                        the procedure. Findings:      Patchy candidiasis was found in the middle third of the esophagus and in       the lower third of the esophagus.      Two non-bleeding linear gastric ulcers with no stigmata of bleeding were       found in the prepyloric region of the stomach. The largest lesion was 4       mm in largest dimension.      The  cardia and gastric fundus were normal on retroflexion.      Due to recurrent desaturation, I  was unable to stay in the stomach for       very long. There is pyloric spasm which prevented passage of the scope       into the duodenum. Impression:           - Monilial esophagitis.                       - Non-bleeding gastric ulcers with no stigmata of                        bleeding.                       - No specimens collected. Recommendation:       - Return patient to hospital ward for ongoing care.                       - Full liquid diet today, then advance as tolerated to                        low residue diet. Procedure Code(s):    --- Professional ---                       [id number], 35, Esophagogastroduodenoscopy, flexible,                        transoral; diagnostic, including collection of                        specimen(s) by brushing or washing, when performed                        (separate procedure) Diagnosis Code(s):    --- Professional ---                       B37.81, Candidal esophagitis                       K25.9, Gastric ulcer, unspecified as acute or chronic,                        without hemorrhage or perforation                       K92.1, Melena (includes Hematochezia) CPT copyright 2016 American [employer] Association. All rights reserved. The codes documented in this report are preliminary and upon coder review may  be revised to meet current compliance requirements. Lollie Sails, MD 07/13/2016 8:07:49 AM This report has been signed electronically. Number of Addenda: 0 Note Initiated On: 07/13/2016 7:39 AM      [facility]

## 2016-07-13 NOTE — Anesthesia Postprocedure Evaluation (Signed)
Anesthesia Post Note  Patient: Michele Nash  Procedure(s) Performed: Procedure(s) (LRB): ESOPHAGOGASTRODUODENOSCOPY (EGD) (N/A)  Patient location during evaluation: PACU Anesthesia Type: General Level of consciousness: sedated and lethargic Pain management: pain level controlled Vital Signs Assessment: post-procedure vital signs reviewed and stable Respiratory status: patient connected to face mask oxygen Cardiovascular status: stable Anesthetic complications: no    Last Vitals:  Vitals:   07/13/16 0820 07/13/16 0830  BP: 137/72 129/68  Pulse: (!) 45 60  Resp: 18 19  Temp:      Last Pain:  Vitals:   07/13/16 0802  TempSrc: Tympanic  PainSc:                  VAN STAVEREN,Alyssabeth Bruster

## 2016-07-13 NOTE — Progress Notes (Signed)
This note also relates to the following rows which could not be included: Pulse Rate - Cannot attach notes to unvalidated device data Resp - Cannot attach notes to unvalidated device data BP - Cannot attach notes to unvalidated device data SpO2 - Cannot attach notes to unvalidated device data  HD [place]

## 2016-07-13 NOTE — Progress Notes (Signed)
POST HD ASSESSMENT 

## 2016-07-13 NOTE — Progress Notes (Addendum)
Renal diet, fluids restriction 1200 ml/day per Dr Verdell Carmine

## 2016-07-13 NOTE — Progress Notes (Signed)
This note also relates to the following rows which could not be included: Pulse Rate - Cannot attach notes to unvalidated device data Resp - Cannot attach notes to unvalidated device data BP - Cannot attach notes to unvalidated device data SpO2 - Cannot attach notes to unvalidated device data   POST HD VITALS 

## 2016-07-13 NOTE — Progress Notes (Signed)
PRE HD INFO 

## 2016-07-13 NOTE — Discharge Summary (Signed)
[facility] NAME: Michele Nash    MR#:  [id number]  DATE OF BIRTH:  10-19-1956  DATE OF ADMISSION:  07/09/2016 ADMITTING PHYSICIAN: Lytle Butte, MD  DATE OF DISCHARGE: 07/13/2016  PRIMARY CARE PHYSICIAN: [name], MD    ADMISSION DIAGNOSIS:  Symptomatic anemia [D64.9] Gastrointestinal hemorrhage, unspecified gastritis, unspecified gastrointestinal hemorrhage type [K92.2]  DISCHARGE DIAGNOSIS:  Active Problems:   GIB (gastrointestinal bleeding)   SECONDARY DIAGNOSIS:   Past Medical History:  Diagnosis Date  . Anemia   . Apnea, sleep    for sleep study 10/17/15-no cpap yet  . Arthritis   . Bronchitis   . Chronic kidney disease   . COPD (chronic obstructive pulmonary disease) ([place])   . Depression   . Dialysis patient ([place]) 2014  . GERD (gastroesophageal reflux disease)   . Headache   . Hyperlipidemia   . Hypertension   . Obesity   . Renal dialysis device, implant, or graft complication    RIGHT CHEST CATH    HOSPITAL COURSE:   60 year old female with past medical history of end-stage renal disease on hemodialysis, hypertension, hyperlipidemia, GERD, COPD, obstructive sleep apnea, chronic anemia who presents to the hospital due to melanotic stools.  1. GI bleed-patient presented to the hospital with melanotic stools. She has a previous history of duodenal erosions. -She is hemoglobin at admission was low as 5.8. Patient has been transfused a total of 3 units of packed red blood cells and her hemoglobin since then has remained stable. -Initially patient was placed on a Protonix drip but now she has been weaned down to just Protonix twice daily orally. A gastroenterology consult was obtained and patient underwent a limited upper endoscopy which showed monilial esophagitis, nonbleeding gastric ulcers. -Patient was discharged on PPI twice a day, and also nystatin swish and swallow for her monilial esophagitis.  2.  Acute blood loss Anemia - secondary to the GI bleed. -Patient's hemoglobin on admission was 5.8 and she has been transfused and hemoglobin is up to 7.4 on day of discharge. She has no further evidence of bleeding.  3. Altered mental status-metabolic encephalopathy secondary to pain medications/Oxycodone -Patient was given some Narcan and her mental status significantly improved. She is not back to baseline now.  4. Anxiety- she will cont. Her Xanax as needed.  5. End-stage renal disease on hemodialysis-while in the hospital nephrology was consulted and she was maintained on her dialysis schedule Monday Wednesday Friday. She will resume that upon discharge.  6. Secondary hyperparathyroidism- she will continue PhosLo, Sensipar.  7. Essential hypertension- she will continue Norvasc, carvedilol, losartan. -BP stable while in hospital.   8. Depression- she will continue Zoloft.  9. COPD-no acute exacerbation while in the hospital. - she will cont. Her Advair, Albuterol as needed.   DISCHARGE CONDITIONS:   Stable.   CONSULTS OBTAINED:  Treatment Team:  Lytle Butte, MD Lollie Sails, MD  DRUG ALLERGIES:   Allergies  Allergen Reactions  . Sulfa Antibiotics Itching, Swelling, Rash and Other (See Comments)    Reaction:  Facial/body swelling     DISCHARGE MEDICATIONS:     Medication List    TAKE these medications   albuterol 108 (90 Base) MCG/ACT inhaler Commonly known as:  PROVENTIL HFA;VENTOLIN HFA Inhale 2 puffs into the lungs every 6 (six) hours as needed for wheezing or shortness of breath.   ALPRAZolam 0.5 MG tablet Commonly known as:  XANAX Take 0.5 mg by mouth  2 (two) times daily.   amLODipine 10 MG tablet Commonly known as:  NORVASC Take 10 mg by mouth at bedtime.   calcium acetate 667 MG capsule Commonly known as:  PHOSLO Take 667-1,334 mg by mouth See admin instructions. Pt takes two capsules three times daily with meals and one capsule two times  daily with snacks.   calcium carbonate 600 MG Tabs tablet Commonly known as:  OS-CAL Take 600 mg by mouth 2 (two) times daily with a meal.   carvedilol 25 MG tablet Commonly known as:  COREG Take 1 tablet (25 mg total) by mouth 2 (two) times daily with a meal. What changed:  how much to take   cinacalcet 30 MG tablet Commonly known as:  SENSIPAR Take 60 mg by mouth daily with breakfast.   ezetimibe 10 MG tablet Commonly known as:  ZETIA Take 10 mg by mouth daily.   fluticasone 50 MCG/ACT nasal spray Commonly known as:  FLONASE Place 2 sprays into both nostrils daily as needed for rhinitis.   Fluticasone-Salmeterol 250-50 MCG/DOSE Aepb Commonly known as:  ADVAIR Inhale 1 puff into the lungs 2 (two) times daily.   furosemide 40 MG tablet Commonly known as:  LASIX Take 40 mg by mouth every Tuesday, Thursday, Saturday, and Sunday.   lidocaine-prilocaine cream Commonly known as:  EMLA Apply 1 application topically as needed (prior to accessing port).   losartan 100 MG tablet Commonly known as:  COZAAR Take 100 mg by mouth daily.   multivitamin Tabs tablet Take 1 tablet by mouth at bedtime.   nystatin 100000 UNIT/ML suspension Commonly known as:  MYCOSTATIN Use as directed 5 mLs (500,000 Units total) in the mouth or throat 4 (four) times daily.   pantoprazole 40 MG tablet Commonly known as:  PROTONIX Take 1 tablet (40 mg total) by mouth 2 (two) times daily.   sertraline 50 MG tablet Commonly known as:  ZOLOFT Take 50 mg by mouth daily.         DISCHARGE INSTRUCTIONS:   DIET:  Cardiac diet and Renal diet  DISCHARGE CONDITION:  Stable  ACTIVITY:  Activity as tolerated  OXYGEN:  Home Oxygen: No.   Oxygen Delivery: room air  DISCHARGE LOCATION:  home   If you experience worsening of your admission symptoms, develop shortness of breath, life threatening emergency, suicidal or homicidal thoughts you must seek medical attention immediately by calling  911 or calling your MD immediately  if symptoms less severe.  You Must read complete instructions/literature along with all the possible adverse reactions/side effects for all the Medicines you take and that have been prescribed to you. Take any new Medicines after you have completely understood and accpet all the possible adverse reactions/side effects.   Please note  You were cared for by a hospitalist during your hospital stay. If you have any questions about your discharge medications or the care you received while you were in the hospital after you are discharged, you can call the unit and asked to speak with the hospitalist on call if the hospitalist that took care of you is not available. Once you are discharged, your primary care physician will handle any further medical issues. Please note that NO REFILLS for any discharge medications will be authorized once you are discharged, as it is imperative that you return to your primary care physician (or establish a relationship with a primary care physician if you do not have one) for your aftercare needs so that they can reassess your  need for medications and monitor your lab values.     Today   No further bleeding overnight. Hg stable. S/p endoscopy this a.m. Showing some gastric ulcers and monilial esophagitis  VITAL SIGNS:  Blood pressure (!) 146/71, pulse 63, temperature 97.6 F (36.4 C), temperature source Oral, resp. rate 15, height 5\' 5"  (1.651 m), weight 67.8 kg (149 lb 7.6 oz), SpO2 100 %.  I/O:   Intake/Output Summary (Last 24 hours) at 07/13/16 1341 Last data filed at 07/13/16 1230  Gross per 24 hour  Intake              240 ml  Output             1932 ml  Net            -1692 ml    PHYSICAL EXAMINATION:   GENERAL:  60 y.o.-year-old patient lying in bed in NAD.   EYES: PERRL.  No scleral icterus. Extraocular muscles intact.  HEENT: Head atraumatic, normocephalic. Oropharynx and nasopharynx clear.  NECK:  Supple, no  jugular venous distention. No thyroid enlargement, no tenderness.  LUNGS: Normal breath sounds bilaterally, no wheezing, rales, rhonchi. No use of accessory muscles of respiration.  CARDIOVASCULAR: S1, S2 normal. No murmurs, rubs, or gallops.  ABDOMEN: Soft, nontender, nondistended. Bowel sounds present. No organomegaly or mass.  EXTREMITIES: No cyanosis, clubbing or edema b/l.    NEUROLOGIC: Cranial nerves II through XII are intact. No focal Motor or sensory deficits b/l. Globally weak.     PSYCHIATRIC: The patient is alert and oriented x 3.   SKIN: No obvious rash, lesion, or ulcer.   Left upper ext. AV fistula with good bruit/thrill.    DATA REVIEW:   CBC  Recent Labs Lab 07/13/16 0319  WBC 6.7  HGB 7.4*  HCT 22.0*  PLT 237    Chemistries   Recent Labs Lab 07/09/16 1642 07/11/16 0417  NA 139 139  K 3.1* 4.1  CL 96* 99*  CO2 33* 26  GLUCOSE 109* 111*  BUN 19 34*  CREATININE 4.16* 7.18*  CALCIUM 7.3* 6.8*  AST 28  --   ALT 18  --   ALKPHOS 128*  --   BILITOT 0.4  --     Cardiac Enzymes  Recent Labs Lab 07/09/16 1642  TROPONINI 0.05*    Microbiology Results  Results for orders placed or performed during the hospital encounter of 05/28/16  MRSA PCR Screening     Status: None   Collection Time: 05/30/16 12:09 PM  Result Value Ref Range Status   MRSA by PCR NEGATIVE NEGATIVE Final    Comment:        The GeneXpert MRSA Assay (FDA approved for NASAL specimens only), is one component of a comprehensive MRSA colonization surveillance program. It is not intended to diagnose MRSA infection nor to guide or monitor treatment for MRSA infections.     RADIOLOGY:  No results found.    Management plans discussed with the patient, family and they are in agreement.  CODE STATUS:     Code Status Orders        Start     Ordered   07/09/16 1843  Full code  Continuous     07/09/16 1843    Code Status History    Date Active Date Inactive Code  Status Order ID Comments User Context   05/28/2016  2:54 PM 06/02/2016  2:51 PM Full Code [id number]  Theodoro Grist, MD Inpatient   04/25/2016  6:04 PM 04/26/2016  9:17 PM Full Code [id number]  Nicholes Mango, MD Inpatient   10/28/2015  6:50 PM 10/30/2015  6:09 PM Full Code [id number]  Theodoro Grist, MD Inpatient   03/30/2015 12:51 AM 03/31/2015  4:44 PM Full Code [id number]  Juluis Mire, MD Inpatient      TOTAL TIME TAKING CARE OF THIS PATIENT: 40 minutes.    Henreitta Leber M.D on 07/13/2016 at 1:41 PM  Between 7am to 6pm - Pager - [phone]  After 6pm go to [email or web address] - Proofreader  [facility]  Office  [phone]  CC: Primary care physician; [name], MD

## 2016-07-13 NOTE — Progress Notes (Signed)
Patient discharged home per MD order. Prescription given to patient. All discharge instructions given and all questions answered. 

## 2016-07-13 NOTE — H&P (Signed)
Subjective: Patient seen for melena. Patient did have a bowel movement last night. This was reported as being dark by the patient but not recorded. She's had no nausea vomiting or abdominal pain.  Objective: Vital signs in last 24 hours: Temp:  [97.3 F (36.3 C)-98.7 F (37.1 C)] 97.3 F (36.3 C) (08/18 0656) Pulse Rate:  [66-82] 66 (08/18 0656) Resp:  [16-20] 16 (08/18 0656) BP: (106-159)/(66-80) 159/80 (08/18 0656) SpO2:  [94 %-100 %] 100 % (08/18 0656) Blood pressure (!) 159/80, pulse 66, temperature 97.3 F (36.3 C), temperature source Tympanic, resp. rate 16, height 5\' 5"  (1.651 m), weight 66.6 kg (146 lb 13.2 oz), SpO2 100 %.   Intake/Output from previous day: 08/17 0701 - 08/18 0700 In: 240 [P.O.:240] Out: 300 [Urine:300]  Intake/Output this shift: No intake/output data recorded.   General appearance:  3 female no distress Resp:  Regular rate and rhythm without rub or gallop, lungs are bilaterally clear Cardio:  Noted GI:  Protuberant soft nontender bowel sounds are positive normoactive Extremities:     Lab Results: Results for orders placed or performed during the hospital encounter of 07/09/16 (from the past 24 hour(s))  CBC     Status: Abnormal   Collection Time: 07/13/16  3:19 AM  Result Value Ref Range   WBC 6.7 3.6 - 11.0 K/uL   RBC 2.24 (L) 3.80 - 5.20 MIL/uL   Hemoglobin 7.4 (L) 12.0 - 16.0 g/dL   HCT 22.0 (L) 35.0 - 47.0 %   MCV 98.3 80.0 - 100.0 fL   MCH 33.1 26.0 - 34.0 pg   MCHC 33.7 32.0 - 36.0 g/dL   RDW 16.8 (H) 11.5 - 14.5 %   Platelets 237 150 - 440 K/uL      Recent Labs  07/11/16 0417 07/12/16 0323 07/13/16 0319  WBC 8.9 7.5 6.7  HGB 7.4* 8.0* 7.4*  HCT 23.5* 23.2* 22.0*  PLT 244 239 237   BMET  Recent Labs  07/11/16 0417  NA 139  K 4.1  CL 99*  CO2 26  GLUCOSE 111*  BUN 34*  CREATININE 7.18*  CALCIUM 6.8*   LFT No results for input(s): PROT, ALBUMIN, AST, ALT, ALKPHOS, BILITOT, BILIDIR, IBILI in the last 72  hours. PT/INR No results for input(s): LABPROT, INR in the last 72 hours. Hepatitis Panel No results for input(s): HEPBSAG, HCVAB, HEPAIGM, HEPBIGM in the last 72 hours. C-Diff No results for input(s): CDIFFTOX in the last 72 hours. No results for input(s): CDIFFPCR in the last 72 hours.   Studies/Results: No results found.  Scheduled Inpatient Medications:   . [MAR Hold] ALPRAZolam  0.5 mg Oral BID  . [MAR Hold] amLODipine  10 mg Oral QHS  . [MAR Hold] calcium acetate  1,334 mg Oral TID WC  . [MAR Hold] calcium acetate  667 mg Oral BID  . [MAR Hold] calcium carbonate  500 mg Oral BID WC  . [MAR Hold] carvedilol  25 mg Oral BID WC  . [MAR Hold] cinacalcet  60 mg Oral Q breakfast  . [MAR Hold] ezetimibe  10 mg Oral Daily  . [MAR Hold] furosemide  40 mg Oral Q T,Th,S,Su  . [MAR Hold] losartan  100 mg Oral Q T,Th,S,Su  . [MAR Hold] mometasone-formoterol  2 puff Inhalation BID  . [MAR Hold] multivitamin  1 tablet Oral QHS  . [MAR Hold] pantoprazole  40 mg Oral BID  . [MAR Hold] sertraline  50 mg Oral Daily    Continuous Inpatient Infusions:   .  sodium chloride      PRN Inpatient Medications:  [MAR Hold] acetaminophen **OR** [MAR Hold] acetaminophen, [MAR Hold] albuterol, [MAR Hold] fluticasone, [MAR Hold] naLOXone (NARCAN)  injection, [MAR Hold] ondansetron **OR** [MAR Hold] ondansetron (ZOFRAN) IV  Miscellaneous:   Assessment:  1. Recurrent melena. History of duodenal ulcer seen on EGD a little over a month ago. Unsure of compliance at home medications. Currently on PPI. Stable  Plan:  EGD today. Further recommendations to follow. I have discussed the risks benefits and complications of procedures to include not limited to bleeding, infection, perforation and the risk of sedation and the patient wishes to proceed.  Lollie Sails MD 07/13/2016, 7:33 AM

## 2016-07-13 NOTE — Progress Notes (Signed)
PRE HD ASSESSMENT 

## 2016-07-13 NOTE — Progress Notes (Signed)
[facility]  ROUNDING NOTE   Subjective:  Patient seen and evaluated during HD. Found to have candidal esophagitis and non bleeding gastric ulcers.     Objective:  Vital signs in last 24 hours:  Temp:  [96.4 F (35.8 C)-98.7 F (37.1 C)] 98.1 F (36.7 C) (08/18 1028) Pulse Rate:  [36-105] 105 (08/18 1038) Resp:  [16-23] 21 (08/18 1038) BP: (106-159)/(55-80) 121/60 (08/18 1038) SpO2:  [93 %-100 %] 100 % (08/18 1038) Weight:  [67.8 kg (149 lb 7.6 oz)] 67.8 kg (149 lb 7.6 oz) (08/18 1028)  Weight change:  Filed Weights   07/11/16 1230 07/11/16 1612 07/13/16 1028  Weight: 68.7 kg (151 lb 7.3 oz) 66.6 kg (146 lb 13.2 oz) 67.8 kg (149 lb 7.6 oz)    Intake/Output: I/O last 3 completed shifts: In: 424 [P.O.:360; I.V.:64] Out: 470 [Urine:470]   Intake/Output this shift:  No intake/output data recorded.  Physical Exam: General: No acute distress  Head: Normocephalic, atraumatic. Moist oral mucosal membranes  Eyes: Anicteric  Neck: Supple, trachea midline  Lungs:  Clear to auscultation, normal effort  Heart: S1S2 no rubs  Abdomen:  Soft, nontender, Bowel sounds present   Extremities: No peripheral edema.  Neurologic: Awake, alert, follows commands   Skin: No lesions  Access: LUE AVF    Basic Metabolic Panel:  Recent Labs Lab 07/09/16 1642 07/11/16 0417 07/11/16 0428  NA 139 139  --   K 3.1* 4.1  --   CL 96* 99*  --   CO2 33* 26  --   GLUCOSE 109* 111*  --   BUN 19 34*  --   CREATININE 4.16* 7.18*  --   CALCIUM 7.3* 6.8*  --   PHOS  --   --  5.5*    Liver Function Tests:  Recent Labs Lab 07/09/16 1642  AST 28  ALT 18  ALKPHOS 128*  BILITOT 0.4  PROT 6.4*  ALBUMIN 3.3*   No results for input(s): LIPASE, AMYLASE in the last 168 hours. No results for input(s): AMMONIA in the last 168 hours.  CBC:  Recent Labs Lab 07/10/16 1123 07/10/16 2005 07/11/16 0417 07/12/16 0323 07/13/16 0319  WBC 9.4 10.2 8.9 7.5 6.7  HGB 7.2* 8.0* 7.4*  8.0* 7.4*  HCT 22.2* 25.0* 23.5* 23.2* 22.0*  MCV 100.0 99.9 99.6 97.5 98.3  PLT 257 235 244 239 237    Cardiac Enzymes:  Recent Labs Lab 07/09/16 1642  TROPONINI 0.05*    BNP: Invalid input(s): POCBNP  CBG: No results for input(s): GLUCAP in the last 168 hours.  Microbiology: Results for orders placed or performed during the hospital encounter of 05/28/16  MRSA PCR Screening     Status: None   Collection Time: 05/30/16 12:09 PM  Result Value Ref Range Status   MRSA by PCR NEGATIVE NEGATIVE Final    Comment:        The GeneXpert MRSA Assay (FDA approved for NASAL specimens only), is one component of a comprehensive MRSA colonization surveillance program. It is not intended to diagnose MRSA infection nor to guide or monitor treatment for MRSA infections.     Coagulation Studies: No results for input(s): LABPROT, INR in the last 72 hours.  Urinalysis: No results for input(s): COLORURINE, LABSPEC, PHURINE, GLUCOSEU, HGBUR, BILIRUBINUR, KETONESUR, PROTEINUR, UROBILINOGEN, NITRITE, LEUKOCYTESUR in the last 72 hours.  Invalid input(s): APPERANCEUR    Imaging: No results found.   Medications:       Assessment/ Plan:  60 y.o. female with  pmhx of HTN, hyperlipidemia, diastolic heart failure, hypertension, COPD, tobacco abuse, AOCD, SHPTH, LUE AVF, ESRD 02/2013, admission for severe anemia/black stools 05/28/16, nonbleeding gastric ulcers on EGD 8/181/17  [facility] [place]/MWF  1. End-stage renal disease on hemodialysis MWF.  Patient seen and evaluated during hemodialysis. Tolerating well. Complete dialysis today.  2. Anemia of chronic kidney disease/GI bleed. Patient continues to have black tarry stools. She has a known duodenal ulcer.  Found to have nonbleeding gastric ulcers.   - Hemoglobin continues to drift up and down. Hemoglobin today is 7.4. This will need to be monitored as an outpatient. She has 2 gastric ulcers on EGD this time. Previously she had a  duodenal ulcer.  3. Secondary hyperparathyroidism.  Continue to monitor bone mineral metabolism parameters as an outpatient. Most recent phosphorus was 5.5.   LOS: 4 Kateria Cutrona 8/18/201710:57 AM

## 2016-07-13 NOTE — Anesthesia Preprocedure Evaluation (Signed)
Anesthesia Evaluation  Patient identified by MRN, date of birth, ID band Patient confused    Reviewed: Allergy & Precautions, NPO status   Airway Mallampati: III       Dental  (+) Upper Dentures   Pulmonary sleep apnea , COPD,  COPD inhaler, former smoker,     + decreased breath sounds      Cardiovascular Exercise Tolerance: Poor hypertension, Pt. on medications and Pt. on home beta blockers  Rhythm:Regular     Neuro/Psych  Headaches, Depression    GI/Hepatic GERD  Medicated,  Endo/Other    Renal/GU Dialysis and ESRFRenal disease     Musculoskeletal   Abdominal (+) + obese,   Peds  Hematology  (+) anemia ,   Anesthesia Other Findings   Reproductive/Obstetrics                             Anesthesia Physical Anesthesia Plan  ASA: IV  Anesthesia Plan: General   Post-op Pain Management:    Induction: Intravenous  Airway Management Planned: Natural Airway and Nasal Cannula  Additional Equipment:   Intra-op Plan:   Post-operative Plan:   Informed Consent: I have reviewed the patients History and Physical, chart, labs and discussed the procedure including the risks, benefits and alternatives for the proposed anesthesia with the patient or authorized representative who has indicated his/her understanding and acceptance.     Plan Discussed with:   Anesthesia Plan Comments:         Anesthesia Quick Evaluation

## 2016-07-15 LAB — H. PYLORI ANTIGEN, STOOL: H. PYLORI STOOL AG, EIA: POSITIVE — AB

## 2016-07-16 ENCOUNTER — Encounter: Payer: Self-pay | Admitting: Gastroenterology

## 2016-07-23 ENCOUNTER — Emergency Department: Payer: Medicare Other

## 2016-07-23 ENCOUNTER — Encounter: Payer: Self-pay | Admitting: Emergency Medicine

## 2016-07-23 ENCOUNTER — Inpatient Hospital Stay
Admission: EM | Admit: 2016-07-23 | Discharge: 2016-07-24 | DRG: 189 | Disposition: A | Discharge status: Home / Self Care | Payer: Medicare Other | Attending: Pulmonary Disease | Admitting: Pulmonary Disease

## 2016-07-23 DIAGNOSIS — E8809 Other disorders of plasma-protein metabolism, not elsewhere classified: Secondary | ICD-10-CM | POA: Diagnosis present

## 2016-07-23 DIAGNOSIS — J449 Chronic obstructive pulmonary disease, unspecified: Secondary | ICD-10-CM | POA: Diagnosis present

## 2016-07-23 DIAGNOSIS — Z90710 Acquired absence of both cervix and uterus: Secondary | ICD-10-CM | POA: Diagnosis not present

## 2016-07-23 DIAGNOSIS — Z7951 Long term (current) use of inhaled steroids: Secondary | ICD-10-CM | POA: Diagnosis not present

## 2016-07-23 DIAGNOSIS — R739 Hyperglycemia, unspecified: Secondary | ICD-10-CM | POA: Diagnosis present

## 2016-07-23 DIAGNOSIS — K219 Gastro-esophageal reflux disease without esophagitis: Secondary | ICD-10-CM | POA: Diagnosis present

## 2016-07-23 DIAGNOSIS — Z8711 Personal history of peptic ulcer disease: Secondary | ICD-10-CM

## 2016-07-23 DIAGNOSIS — Z6824 Body mass index (BMI) 24.0-24.9, adult: Secondary | ICD-10-CM

## 2016-07-23 DIAGNOSIS — Z9889 Other specified postprocedural states: Secondary | ICD-10-CM | POA: Diagnosis not present

## 2016-07-23 DIAGNOSIS — I132 Hypertensive heart and chronic kidney disease with heart failure and with stage 5 chronic kidney disease, or end stage renal disease: Secondary | ICD-10-CM | POA: Diagnosis present

## 2016-07-23 DIAGNOSIS — M1990 Unspecified osteoarthritis, unspecified site: Secondary | ICD-10-CM | POA: Diagnosis present

## 2016-07-23 DIAGNOSIS — G4730 Sleep apnea, unspecified: Secondary | ICD-10-CM | POA: Diagnosis present

## 2016-07-23 DIAGNOSIS — D638 Anemia in other chronic diseases classified elsewhere: Secondary | ICD-10-CM | POA: Diagnosis present

## 2016-07-23 DIAGNOSIS — E669 Obesity, unspecified: Secondary | ICD-10-CM | POA: Diagnosis present

## 2016-07-23 DIAGNOSIS — J9601 Acute respiratory failure with hypoxia: Secondary | ICD-10-CM | POA: Diagnosis not present

## 2016-07-23 DIAGNOSIS — Z87891 Personal history of nicotine dependence: Secondary | ICD-10-CM

## 2016-07-23 DIAGNOSIS — N186 End stage renal disease: Secondary | ICD-10-CM | POA: Diagnosis present

## 2016-07-23 DIAGNOSIS — E785 Hyperlipidemia, unspecified: Secondary | ICD-10-CM | POA: Diagnosis present

## 2016-07-23 DIAGNOSIS — Z882 Allergy status to sulfonamides status: Secondary | ICD-10-CM | POA: Diagnosis not present

## 2016-07-23 DIAGNOSIS — F411 Generalized anxiety disorder: Secondary | ICD-10-CM | POA: Diagnosis present

## 2016-07-23 DIAGNOSIS — Z79899 Other long term (current) drug therapy: Secondary | ICD-10-CM | POA: Diagnosis not present

## 2016-07-23 DIAGNOSIS — Z992 Dependence on renal dialysis: Secondary | ICD-10-CM

## 2016-07-23 DIAGNOSIS — F439 Reaction to severe stress, unspecified: Secondary | ICD-10-CM | POA: Diagnosis present

## 2016-07-23 DIAGNOSIS — I5032 Chronic diastolic (congestive) heart failure: Secondary | ICD-10-CM | POA: Diagnosis present

## 2016-07-23 DIAGNOSIS — D72829 Elevated white blood cell count, unspecified: Secondary | ICD-10-CM | POA: Diagnosis present

## 2016-07-23 DIAGNOSIS — N2581 Secondary hyperparathyroidism of renal origin: Secondary | ICD-10-CM | POA: Diagnosis present

## 2016-07-23 DIAGNOSIS — I10 Essential (primary) hypertension: Secondary | ICD-10-CM | POA: Diagnosis not present

## 2016-07-23 DIAGNOSIS — J9690 Respiratory failure, unspecified, unspecified whether with hypoxia or hypercapnia: Secondary | ICD-10-CM | POA: Diagnosis present

## 2016-07-23 DIAGNOSIS — J810 Acute pulmonary edema: Principal | ICD-10-CM | POA: Diagnosis present

## 2016-07-23 DIAGNOSIS — J9621 Acute and chronic respiratory failure with hypoxia: Secondary | ICD-10-CM | POA: Diagnosis present

## 2016-07-23 DIAGNOSIS — R748 Abnormal levels of other serum enzymes: Secondary | ICD-10-CM | POA: Diagnosis present

## 2016-07-23 HISTORY — DX: Respiratory failure, unspecified, unspecified whether with hypoxia or hypercapnia: J96.90

## 2016-07-23 LAB — CBC WITH DIFFERENTIAL/PLATELET
Basophils Absolute: 0.1 10*3/uL (ref 0–0.1)
Basophils Relative: 1 %
EOS ABS: 0.2 10*3/uL (ref 0–0.7)
Eosinophils Relative: 1 %
HEMATOCRIT: 23.9 % — AB (ref 35.0–47.0)
HEMOGLOBIN: 7.9 g/dL — AB (ref 12.0–16.0)
LYMPHS ABS: 0.7 10*3/uL — AB (ref 1.0–3.6)
LYMPHS PCT: 6 %
MCH: 32.3 pg (ref 26.0–34.0)
MCHC: 32.9 g/dL (ref 32.0–36.0)
MCV: 98.3 fL (ref 80.0–100.0)
MONOS PCT: 5 %
Monocytes Absolute: 0.6 10*3/uL (ref 0.2–0.9)
NEUTROS ABS: 11.2 10*3/uL — AB (ref 1.4–6.5)
NEUTROS PCT: 87 %
Platelets: 293 10*3/uL (ref 150–440)
RBC: 2.43 MIL/uL — AB (ref 3.80–5.20)
RDW: 15.5 % — ABNORMAL HIGH (ref 11.5–14.5)
WBC: 12.7 10*3/uL — AB (ref 3.6–11.0)

## 2016-07-23 LAB — COMPREHENSIVE METABOLIC PANEL
ALBUMIN: 3.1 g/dL — AB (ref 3.5–5.0)
ALK PHOS: 157 U/L — AB (ref 38–126)
ALT: 21 U/L (ref 14–54)
AST: 35 U/L (ref 15–41)
Anion gap: 9 (ref 5–15)
BILIRUBIN TOTAL: 0.4 mg/dL (ref 0.3–1.2)
BUN: 54 mg/dL — ABNORMAL HIGH (ref 6–20)
CALCIUM: 7 mg/dL — AB (ref 8.9–10.3)
CO2: 27 mmol/L (ref 22–32)
CREATININE: 7.21 mg/dL — AB (ref 0.44–1.00)
Chloride: 104 mmol/L (ref 101–111)
GFR calc non Af Amer: 5 mL/min — ABNORMAL LOW (ref >60)
GFR, EST AFRICAN AMERICAN: 6 mL/min — AB (ref >60)
GLUCOSE: 162 mg/dL — AB (ref 65–99)
Potassium: 4.7 mmol/L (ref 3.5–5.1)
SODIUM: 140 mmol/L (ref 135–145)
TOTAL PROTEIN: 6.1 g/dL — AB (ref 6.5–8.1)

## 2016-07-23 LAB — TROPONIN I
TROPONIN I: 0.05 ng/mL — AB (ref <0.03)
Troponin I: 0.2 ng/mL (ref <0.03)

## 2016-07-23 LAB — BRAIN NATRIURETIC PEPTIDE: B Natriuretic Peptide: 664 pg/mL — ABNORMAL HIGH (ref 0.0–100.0)

## 2016-07-23 LAB — GLUCOSE, CAPILLARY
Glucose-Capillary: 234 mg/dL — ABNORMAL HIGH (ref 65–99)
Glucose-Capillary: 496 mg/dL — ABNORMAL HIGH (ref 65–99)

## 2016-07-23 LAB — PHOSPHORUS: Phosphorus: 3.3 mg/dL (ref 2.5–4.6)

## 2016-07-23 LAB — MRSA PCR SCREENING: MRSA by PCR: NEGATIVE

## 2016-07-23 MED ORDER — SERTRALINE HCL 50 MG PO TABS
50.0000 mg | ORAL_TABLET | Freq: Every day | ORAL | Status: DC
  Administered 2016-07-23 – 2016-07-24 (×2): 50 mg via ORAL
  Filled 2016-07-23 (×2): qty 1

## 2016-07-23 MED ORDER — ACETAMINOPHEN 325 MG PO TABS: 650.0000 mg | ORAL_TABLET | ORAL | Status: DC | PRN

## 2016-07-23 MED ORDER — HYDRALAZINE HCL 20 MG/ML IJ SOLN: 10.0000 mg | INTRAMUSCULAR | Status: DC | PRN

## 2016-07-23 MED ORDER — ONDANSETRON HCL 4 MG/2ML IJ SOLN: 4.0000 mg | Freq: Four times a day (QID) | INTRAMUSCULAR | Status: DC | PRN

## 2016-07-23 MED ORDER — ALPRAZOLAM 0.5 MG PO TABS
0.5000 mg | ORAL_TABLET | Freq: Two times a day (BID) | ORAL | Status: DC
  Administered 2016-07-23 – 2016-07-24 (×3): 0.5 mg via ORAL
  Filled 2016-07-23 (×3): qty 1

## 2016-07-23 MED ORDER — CALCIUM ACETATE (PHOS BINDER) 667 MG PO CAPS
667.0000 mg | ORAL_CAPSULE | Freq: Two times a day (BID) | ORAL | Status: DC
  Administered 2016-07-23 (×2): 667 mg via ORAL
  Filled 2016-07-23 (×2): qty 1

## 2016-07-23 MED ORDER — HEPARIN SODIUM (PORCINE) 5000 UNIT/ML IJ SOLN
5000.0000 [IU] | Freq: Three times a day (TID) | INTRAMUSCULAR | Status: DC
  Filled 2016-07-23: qty 1

## 2016-07-23 MED ORDER — SODIUM CHLORIDE 0.9 % IV SOLN: 250.0000 mL | INTRAVENOUS | Status: DC | PRN

## 2016-07-23 MED ORDER — METHYLPREDNISOLONE SODIUM SUCC 125 MG IJ SOLR
125.0000 mg | Freq: Once | INTRAMUSCULAR | Status: AC
  Administered 2016-07-23: 125 mg via INTRAVENOUS
  Filled 2016-07-23: qty 2

## 2016-07-23 MED ORDER — ONDANSETRON HCL 4 MG/2ML IJ SOLN
INTRAMUSCULAR | Status: AC
  Filled 2016-07-23: qty 2

## 2016-07-23 MED ORDER — ONDANSETRON HCL 4 MG/2ML IJ SOLN
4.0000 mg | Freq: Once | INTRAMUSCULAR | Status: AC
  Administered 2016-07-23: 4 mg via INTRAVENOUS

## 2016-07-23 MED ORDER — METOPROLOL TARTRATE 5 MG/5ML IV SOLN: 2.5000 mg | INTRAVENOUS | Status: DC | PRN

## 2016-07-23 MED ORDER — CALCIUM ACETATE 667 MG PO CAPS
1334.0000 mg | ORAL_CAPSULE | Freq: Three times a day (TID) | ORAL | Status: DC
  Filled 2016-07-23: qty 2

## 2016-07-23 MED ORDER — CARVEDILOL 25 MG PO TABS
25.0000 mg | ORAL_TABLET | Freq: Two times a day (BID) | ORAL | Status: DC
  Administered 2016-07-23 – 2016-07-24 (×2): 25 mg via ORAL
  Filled 2016-07-23 (×2): qty 1

## 2016-07-23 MED ORDER — IPRATROPIUM-ALBUTEROL 0.5-2.5 (3) MG/3ML IN SOLN
3.0000 mL | Freq: Four times a day (QID) | RESPIRATORY_TRACT | Status: DC
  Administered 2016-07-23 – 2016-07-24 (×3): 3 mL via RESPIRATORY_TRACT
  Filled 2016-07-23 (×5): qty 3

## 2016-07-23 MED ORDER — ORAL CARE MOUTH RINSE: 15.0000 mL | Freq: Two times a day (BID) | OROMUCOSAL | Status: DC

## 2016-07-23 MED ORDER — ORAL CARE MOUTH RINSE
15.0000 mL | Freq: Two times a day (BID) | OROMUCOSAL | Status: DC
  Administered 2016-07-24: 15 mL via OROMUCOSAL

## 2016-07-23 MED ORDER — LOSARTAN POTASSIUM 50 MG PO TABS
100.0000 mg | ORAL_TABLET | Freq: Every day | ORAL | Status: DC
  Administered 2016-07-23: 100 mg via ORAL
  Filled 2016-07-23: qty 2

## 2016-07-23 MED ORDER — PANTOPRAZOLE SODIUM 40 MG PO TBEC
40.0000 mg | DELAYED_RELEASE_TABLET | Freq: Every day | ORAL | Status: DC
  Administered 2016-07-23 – 2016-07-24 (×2): 40 mg via ORAL
  Filled 2016-07-23 (×2): qty 1

## 2016-07-23 MED ORDER — ASPIRIN 300 MG RE SUPP
300.0000 mg | RECTAL | Status: AC
Start: 2016-07-23 — End: 2016-07-23

## 2016-07-23 MED ORDER — ASPIRIN 81 MG PO CHEW
324.0000 mg | CHEWABLE_TABLET | ORAL | Status: AC
Start: 2016-07-23 — End: 2016-07-23
  Administered 2016-07-23: 324 mg via ORAL
  Filled 2016-07-23: qty 4

## 2016-07-23 MED ORDER — AMLODIPINE BESYLATE 5 MG PO TABS
10.0000 mg | ORAL_TABLET | Freq: Every day | ORAL | Status: DC
  Administered 2016-07-23 – 2016-07-24 (×2): 5 mg via ORAL
  Filled 2016-07-23: qty 1
  Filled 2016-07-23: qty 2

## 2016-07-23 MED ORDER — ALBUTEROL SULFATE (2.5 MG/3ML) 0.083% IN NEBU: 2.5000 mg | INHALATION_SOLUTION | RESPIRATORY_TRACT | Status: DC | PRN

## 2016-07-23 MED ORDER — EZETIMIBE 10 MG PO TABS
10.0000 mg | ORAL_TABLET | Freq: Every day | ORAL | Status: DC
  Administered 2016-07-23 – 2016-07-24 (×2): 10 mg via ORAL
  Filled 2016-07-23 (×2): qty 1

## 2016-07-23 MED ORDER — CALCIUM CARBONATE ANTACID 500 MG PO CHEW
500.0000 mg | CHEWABLE_TABLET | Freq: Two times a day (BID) | ORAL | Status: DC
  Administered 2016-07-23 – 2016-07-24 (×2): 500 mg via ORAL
  Filled 2016-07-23 (×2): qty 1

## 2016-07-23 MED ORDER — RENA-VITE PO TABS
1.0000 | ORAL_TABLET | Freq: Every day | ORAL | Status: DC
  Administered 2016-07-23: 1 via ORAL
  Filled 2016-07-23: qty 1

## 2016-07-23 MED ORDER — IPRATROPIUM-ALBUTEROL 0.5-2.5 (3) MG/3ML IN SOLN
3.0000 mL | Freq: Once | RESPIRATORY_TRACT | Status: AC
  Administered 2016-07-23: 3 mL via RESPIRATORY_TRACT
  Filled 2016-07-23: qty 3

## 2016-07-23 NOTE — ED Triage Notes (Signed)
Pt via ems from home with breathing difficulty for a few hours. Pt tried using rescue inhaler with no relief. Pt tripoding and in obvious distress upon arrival. FD was unable to get O2 sat and put her on NRB mask. Pt placed on bipap at arrival. Pt alert & oriented with diaphoretic skin.

## 2016-07-23 NOTE — ED Notes (Signed)
Pt going to HD; they will take pt to ICU upon completion.

## 2016-07-23 NOTE — Progress Notes (Signed)
PRE HD ASSESSMENT 

## 2016-07-23 NOTE — Progress Notes (Signed)
[facility]  ROUNDING NOTE   Subjective:  Patient seen and evaluated during HD.   HEMODIALYSIS FLOWSHEET:  Blood Flow Rate (mL/min): 400 mL/min Arterial Pressure (mmHg): -70 mmHg Venous Pressure (mmHg): 150 mmHg Transmembrane Pressure (mmHg): 70 mmHg Ultrafiltration Rate (mL/min): 1000 mL/min Dialysate Flow Rate (mL/min): 800 ml/min Conductivity: Machine : 14 Conductivity: Machine : 14 Dialysis Fluid Bolus: Normal Saline Bolus Amount (mL): 250 mL (prime) Dialysate Change: 2K Intra-Hemodialysis Comments: 223. pt resting, md present, vss.   Patient presented to ER with difficulty breathing Denies missing any dialysis treatments Last Treatment was on Friday     Objective:  Vital signs in last 24 hours:  Temp:  [97.7 F (36.5 C)-97.8 F (36.6 C)] 97.8 F (36.6 C) (08/28 1240) Pulse Rate:  [65-93] 70 (08/28 1300) Resp:  [14-31] 20 (08/28 1300) BP: (142-201)/(78-152) 142/82 (08/28 1300) SpO2:  [88 %-100 %] 100 % (08/28 1300) Weight:  [68 kg (150 lb)] 68 kg (150 lb) (08/28 0858)  Weight change:  Filed Weights   07/23/16 0858  Weight: 68 kg (150 lb)    Intake/Output: No intake/output data recorded.   Intake/Output this shift:  No intake/output data recorded.  Physical Exam: General: No acute distress  Head: Normocephalic, atraumatic. Moist oral mucosal membranes  Eyes: Anicteric  Neck: Supple, trachea midline  Lungs:  Clear to auscultation, normal effort  Heart: S1S2 no rubs  Abdomen:  Soft, nontender, Bowel sounds present   Extremities: No peripheral edema.  Neurologic: Awake, alert, follows commands   Skin: No lesions  Access: LUE AVF    Basic Metabolic Panel:  Recent Labs Lab 07/23/16 0943  NA 140  K 4.7  CL 104  CO2 27  GLUCOSE 162*  BUN 54*  CREATININE 7.21*  CALCIUM 7.0*    Liver Function Tests:  Recent Labs Lab 07/23/16 0943  AST 35  ALT 21  ALKPHOS 157*  BILITOT 0.4  PROT 6.1*  ALBUMIN 3.1*   No results for  input(s): LIPASE, AMYLASE in the last 168 hours. No results for input(s): AMMONIA in the last 168 hours.  CBC:  Recent Labs Lab 07/23/16 0943  WBC 12.7*  NEUTROABS 11.2*  HGB 7.9*  HCT 23.9*  MCV 98.3  PLT 293    Cardiac Enzymes:  Recent Labs Lab 07/23/16 0943  TROPONINI 0.05*    BNP: Invalid input(s): POCBNP  CBG: No results for input(s): GLUCAP in the last 168 hours.  Microbiology: Results for orders placed or performed during the hospital encounter of 05/28/16  MRSA PCR Screening     Status: None   Collection Time: 05/30/16 12:09 PM  Result Value Ref Range Status   MRSA by PCR NEGATIVE NEGATIVE Final    Comment:        The GeneXpert MRSA Assay (FDA approved for NASAL specimens only), is one component of a comprehensive MRSA colonization surveillance program. It is not intended to diagnose MRSA infection nor to guide or monitor treatment for MRSA infections.     Coagulation Studies: No results for input(s): LABPROT, INR in the last 72 hours.  Urinalysis: No results for input(s): COLORURINE, LABSPEC, PHURINE, GLUCOSEU, HGBUR, BILIRUBINUR, KETONESUR, PROTEINUR, UROBILINOGEN, NITRITE, LEUKOCYTESUR in the last 72 hours.  Invalid input(s): APPERANCEUR    Imaging: Dg Chest Port 1 View  Result Date: 07/23/2016 CLINICAL DATA:  Shortness of breath, on BiPAP, COPD, end-stage renal disease on dialysis, GERD EXAM: PORTABLE CHEST 1 VIEW COMPARISON:  Portable exam 1054 hours compared to 05/02/2016 FINDINGS: Enlargement of cardiac silhouette  with pulmonary vascular congestion. Tortuous thoracic aorta. Mild diffuse interstitial edema compatible with CHF or fluid overload. Question tiny bibasilar pleural effusions. No pneumothorax. Bones demineralized. IMPRESSION: Enlargement of cardiac silhouette with pulmonary vascular congestion and diffuse interstitial edema question CHF versus fluid overload. Electronically Signed   By: [name] M.D.   On: 07/23/2016 11:10      Medications:       Assessment/ Plan:  60 y.o. female with pmhx of HTN, hyperlipidemia, diastolic heart failure, hypertension, COPD, tobacco abuse, AOCD, SHPTH, LUE AVF, ESRD 02/2013, admission for severe anemia/black stools 05/28/16, nonbleeding gastric ulcers on EGD 8/181/17  [facility] [place]/MWF  1. End-stage renal disease on hemodialysis MWF.  Patient seen and evaluated during hemodialysis. Tolerating well.  - urgent HD Today - goal UF 3-4 as tolerated  2.  Acute Pulm edema - urgent HD . UF goal as tolerated  3. Secondary hyperparathyroidism.  Monitor Phos    LOS: 0 Daquarius Dubeau 8/28/20171:45 PM

## 2016-07-23 NOTE — Progress Notes (Signed)
Pre hd 

## 2016-07-23 NOTE — H&P (Signed)
PULMONARY / CRITICAL CARE MEDICINE   Name: Michele Nash MRN: [id number] DOB: 1956-11-14    ADMISSION DATE:  07/23/2016 CONSULTATION DATE:07/23/16  REFERRING MD:   Dr.Sorren Vallier Hsienta  CHIEF COMPLAINT:  Respiratory distress  HISTORY OF PRESENT ILLNESS:   Michele Nash is a 60 year old female with Hx of COPD,ESRD on HD(Last HD on Friday), Depression, GERD, Hyperlipidemia, Hypertension and obesity. Patient presented to [facility] on 8/28 with shortness of breath.  Patient was dialyzed on Friday(07/20/16) but was unable to get to dialysis center today due to shortness of breath.  Patient denies any Hx of heart failure.  Patient was placed on BiPAP.  Her CXR is concerning for pulmonary edema, therefore she will be getting emergent dialysis and will be monitored closely in the ICU overnight and if her health condition warrants she can be discharged tomorrow.  PAST MEDICAL HISTORY :  She  has a past medical history of Anemia; Apnea, sleep; Arthritis; Bronchitis; Chronic kidney disease; COPD (chronic obstructive pulmonary disease) ([place]); Depression; Dialysis patient [facility]) (2014); GERD (gastroesophageal reflux disease); Headache; Hyperlipidemia; Hypertension; Obesity; and Renal dialysis device, implant, or graft complication.  PAST SURGICAL HISTORY: She  has a past surgical history that includes Abdominal hysterectomy; Dialysis fistula creation; Colonoscopy (2011); Cardiac catheterization (N/A, 05/10/2015); Cardiac catheterization (Left, 05/10/2015); Cardiac catheterization (Left, 08/23/2015); Cardiac catheterization (N/A, 08/23/2015); Cardiac catheterization (08/23/2015); Colonoscopy with propofol (N/A, 09/20/2015); Revison of arteriovenous fistula (Left, 10/28/2015); Wound debridement (Left, 10/28/2015); Cardiac catheterization (N/A, 01/03/2016); Esophagogastroduodenoscopy (egd) with propofol (N/A, 06/02/2016); and Esophagogastroduodenoscopy (N/A, 07/13/2016).  Allergies  Allergen Reactions  . Sulfa Antibiotics Itching,  Swelling, Rash and Other (See Comments)    Reaction:  Facial/body swelling     No current facility-administered medications on file prior to encounter.    Current Outpatient Prescriptions on File Prior to Encounter  Medication Sig  . albuterol (PROVENTIL HFA;VENTOLIN HFA) 108 (90 BASE) MCG/ACT inhaler Inhale 2 puffs into the lungs every 6 (six) hours as needed for wheezing or shortness of breath.  Marland Kitchen amLODipine (NORVASC) 10 MG tablet Take 10 mg by mouth at bedtime.  . calcium acetate (PHOSLO) 667 MG capsule Take 667-1,334 mg by mouth See admin instructions. Pt takes two capsules three times daily with meals and one capsule two times daily with snacks.  . calcium carbonate (OS-CAL) 600 MG TABS tablet Take 600 mg by mouth 2 (two) times daily with a meal.  . carvedilol (COREG) 25 MG tablet Take 1 tablet (25 mg total) by mouth 2 (two) times daily with a meal. (Patient taking differently: Take 12.5 mg by mouth 2 (two) times daily with a meal. )  . cinacalcet (SENSIPAR) 30 MG tablet Take 60 mg by mouth daily with breakfast.   . ezetimibe (ZETIA) 10 MG tablet Take 10 mg by mouth daily.   . fluticasone (FLONASE) 50 MCG/ACT nasal spray Place 2 sprays into both nostrils daily as needed for rhinitis.  . Fluticasone-Salmeterol (ADVAIR) 250-50 MCG/DOSE AEPB Inhale 1 puff into the lungs 2 (two) times daily.  . furosemide (LASIX) 40 MG tablet Take 40 mg by mouth every Tuesday, Thursday, Saturday, and Sunday.   . lidocaine-prilocaine (EMLA) cream Apply 1 application topically as needed (prior to accessing port).  . losartan (COZAAR) 100 MG tablet Take 100 mg by mouth daily.   . multivitamin (RENA-VIT) TABS tablet Take 1 tablet by mouth at bedtime.   . pantoprazole (PROTONIX) 40 MG tablet Take 1 tablet (40 mg total) by mouth 2 (two) times daily.  . sertraline (  ZOLOFT) 50 MG tablet Take 50 mg by mouth daily.   Marland Kitchen ALPRAZolam (XANAX) 0.5 MG tablet Take 0.5 mg by mouth 2 (two) times daily.    FAMILY HISTORY:   Her indicated that her mother is deceased. She indicated that her father is deceased. She indicated that her sister is deceased.    SOCIAL HISTORY: She  reports that she quit smoking about 20 months ago. Her smoking use included Cigarettes. She has a 20.00 pack-year smoking history. She has never used smokeless tobacco. She reports that she does not drink alcohol or use drugs.  REVIEW OF SYSTEMS:   Unable to obtain as the patient is on BiPAP  SUBJECTIVE:  Unable to obtain as the patient is on BiPAP  VITAL SIGNS: BP (!) 156/78 (BP Location: Right Arm)   Pulse 69   Temp 97.7 F (36.5 C) (Oral)   Resp 16   Ht 5\' 5"  (1.651 m)   Wt 150 lb (68 kg)   SpO2 100%   BMI 24.96 kg/m   HEMODYNAMICS:    VENTILATOR SETTINGS:    INTAKE / OUTPUT: No intake/output data recorded.  PHYSICAL EXAMINATION: General:   Neuro:  Awke, alert. Oriented, follows command HEENT:  Atraumatic, normocephalic. No discharge, no JVD appreciated Cardiovascular:  S1S2, RRR, no MRG noted Lungs: Crackles bilateral bases, no wheezes, rhonchi noted Abdomen:  Soft, non tender, active bowel sounds Musculoskeletal:  +1 edema bilateral legs Skin:  Grossly intact.  LABS:  BMET  Recent Labs Lab 07/23/16 0943  NA 140  K 4.7  CL 104  CO2 27  BUN 54*  CREATININE 7.21*  GLUCOSE 162*    Electrolytes  Recent Labs Lab 07/23/16 0943  CALCIUM 7.0*    CBC  Recent Labs Lab 07/23/16 0943  WBC 12.7*  HGB 7.9*  HCT 23.9*  PLT 293    Coag's No results for input(s): APTT, INR in the last 168 hours.  Sepsis Markers No results for input(s): LATICACIDVEN, PROCALCITON, O2SATVEN in the last 168 hours.  ABG  Recent Labs Lab 07/23/16 0944  PHART 7.26*  PCO2ART 68*  PO2ART 409*    Liver Enzymes  Recent Labs Lab 07/23/16 0943  AST 35  ALT 21  ALKPHOS 157*  BILITOT 0.4  ALBUMIN 3.1*    Cardiac Enzymes  Recent Labs Lab 07/23/16 0943  TROPONINI 0.05*    Glucose No results for  input(s): GLUCAP in the last 168 hours.  Imaging Dg Chest Port 1 View  Result Date: 07/23/2016 CLINICAL DATA:  Shortness of breath, on BiPAP, COPD, end-stage renal disease on dialysis, GERD EXAM: PORTABLE CHEST 1 VIEW COMPARISON:  Portable exam 1054 hours compared to 05/02/2016 FINDINGS: Enlargement of cardiac silhouette with pulmonary vascular congestion. Tortuous thoracic aorta. Mild diffuse interstitial edema compatible with CHF or fluid overload. Question tiny bibasilar pleural effusions. No pneumothorax. Bones demineralized. IMPRESSION: Enlargement of cardiac silhouette with pulmonary vascular congestion and diffuse interstitial edema question CHF versus fluid overload. Electronically Signed   By: [name] M.D.   On: 07/23/2016 11:10     STUDIES:  None  CULTURES: none  ANTIBIOTICS: none  SIGNIFICANT EVENTS: 29/28  60 year old female with ESRD admitted with acute shortness of breath due to  pulmonary edema requiring emergent dialysis   LINES/TUBES: none  DISCUSSION: 60 year old female with COPD, ESRD,GERD, hypertension came in with acute shortness of breath due to pulmonary edema requiring BiPAP and emergent dialysis.  ASSESSMENT / PLAN:  PULMONARY A: Pulmonary edema Hx of COPD  P:   Continue BiPAP to keep sats>92% Dialysis scheduled on 07/23/16 Bronchodilators Received Methylprednisolone 125mg  X1 in ED   CARDIOVASCULAR A:  Essential hypertension Hyperlipidemia Elevated BNP Mildly elevated troponin P:  Continuous telemetry Continue amlodipine/coreg/losartan Trend BNP Continue Zetia Hydralazine/metoprolol  PRN  RENAL A:    End stage Renal disease -M-W-F hypoalbuminemia P:    Hemodialysis on 07/23/16  GASTROINTESTINAL A:   Hx of GERD P:   Continue Protonix  HEMATOLOGIC A:   No active issues P:  Heparin for DVT prophylaxis SCDS  INFECTIOUS A:   Leukocytosis related to stress response P:   Monitor fever curve  ENDOCRINE A:   Hyperglycemia   P:   Carb modified diet Blood sugar checks ACHS  NEUROLOGIC A:   Generalised anxiety disorder P:   RASS goal: 0 Continue Alprazolam   Note:  Patient will be watched in the ICU overnight and will be discharged tomorrow if her condition warrants.    Bincy Varughese,AG-ACNP Pulmonary and [place]   07/23/2016, 12:20 PM   PCCM ATTENDING ATTESTATION:  I have evaluated patient with ANP Varughese, reviewed database in its entirety and discussed care plan in detail. In addition, this patient was discussed on multidisciplinary rounds.   Important findings: Well supported on BiPAP Pulmonary edema pattern on CXR No wheezes Diffuse crackles Severe hypertension  Major problems addressed by PCCM team: Acute respiratory failure with hypoxemia Pulmonary edema Severe hypertension ESRD   PLAN/REC: Admit to ICU/SDU NIPPV Antihypertensives Urgent HD per Renal Service She might need some adjustment of her antihypertensive regimen and/or reassessment of "dry weight" prior to discharge    Merton Border, MD PCCM service Mobile [phone] Pager [phone]

## 2016-07-23 NOTE — ED Provider Notes (Signed)
[facility] Provider Note   CSN: [id number] Arrival date & time: 07/23/16  [id number]     History   Chief Complaint Chief Complaint  Patient presents with  . Respiratory Distress    HPI Michele Nash is a 60 y.o. female hx of COPD, ESRD on HD (last HD 2 days ago), HTN, sleep apnea here with shortness of breath. Patient states that she's been short of breath since this morning. Patient denies any fevers or chills but has some nonproductive cough. She states that she had dialysis on Friday and today she dialysis today but she didn't get a yet. Denies any history of heart failure. Patient called EMS and fire department got there and was unable to get a pulse ox on her so put her on a nonrebreather. Came by EMS.   The history is provided by the patient.    Past Medical History:  Diagnosis Date  . Anemia   . Apnea, sleep    for sleep study 10/17/15-no cpap yet  . Arthritis   . Bronchitis   . Chronic kidney disease   . COPD (chronic obstructive pulmonary disease) ([place])   . Depression   . Dialysis patient ([place]) 2014  . GERD (gastroesophageal reflux disease)   . Headache   . Hyperlipidemia   . Hypertension   . Obesity   . Renal dialysis device, implant, or graft complication    RIGHT CHEST CATH    Patient Active Problem List   Diagnosis Date Noted  . GIB (gastrointestinal bleeding) 07/09/2016  . Protein-calorie malnutrition, severe 05/29/2016  . Hyperglycemia 05/28/2016  . Pain in limb 10/28/2015  . End stage renal disease ([place]) 10/28/2015  . Essential hypertension 10/28/2015  . ESRD on hemodialysis ([place]) 03/29/2015  . HTN (hypertension) 03/29/2015  . COPD (chronic obstructive pulmonary disease) ([place]) 03/29/2015  . GAD (generalized anxiety disorder) 03/29/2015    Past Surgical History:  Procedure Laterality Date  . ABDOMINAL HYSTERECTOMY    . COLONOSCOPY  2011  . COLONOSCOPY WITH PROPOFOL N/A 09/20/2015   Procedure: COLONOSCOPY WITH PROPOFOL;  Surgeon:  Lucilla Lame, MD;  Location: [facility] ENDOSCOPY;  Service: Endoscopy;  Laterality: N/A;  . DIALYSIS FISTULA CREATION    . ESOPHAGOGASTRODUODENOSCOPY N/A 07/13/2016   Procedure: ESOPHAGOGASTRODUODENOSCOPY (EGD);  Surgeon: Lollie Sails, MD;  Location: [facility] ENDOSCOPY;  Service: Endoscopy;  Laterality: N/A;  . ESOPHAGOGASTRODUODENOSCOPY (EGD) WITH PROPOFOL N/A 06/02/2016   Procedure: ESOPHAGOGASTRODUODENOSCOPY (EGD) WITH PROPOFOL;  Surgeon: Manya Silvas, MD;  Location: [facility] ENDOSCOPY;  Service: Endoscopy;  Laterality: N/A;  . PERIPHERAL VASCULAR CATHETERIZATION N/A 05/10/2015   Procedure: A/V Shuntogram/Fistulagram;  Surgeon: Katha Cabal, MD;  Location: [facility] CV LAB;  Service: Cardiovascular;  Laterality: N/A;  . PERIPHERAL VASCULAR CATHETERIZATION Left 05/10/2015   Procedure: A/V Shunt Intervention;  Surgeon: Katha Cabal, MD;  Location: [facility] CV LAB;  Service: Cardiovascular;  Laterality: Left;  . PERIPHERAL VASCULAR CATHETERIZATION Left 08/23/2015   Procedure: A/V Shuntogram/Fistulagram;  Surgeon: Katha Cabal, MD;  Location: [facility] CV LAB;  Service: Cardiovascular;  Laterality: Left;  . PERIPHERAL VASCULAR CATHETERIZATION N/A 08/23/2015   Procedure: A/V Shunt Intervention;  Surgeon: Katha Cabal, MD;  Location: [facility] CV LAB;  Service: Cardiovascular;  Laterality: N/A;  . PERIPHERAL VASCULAR CATHETERIZATION  08/23/2015   Procedure: Dialysis/Perma Catheter Insertion;  Surgeon: Katha Cabal, MD;  Location: [facility] CV LAB;  Service: Cardiovascular;;  . PERIPHERAL VASCULAR CATHETERIZATION N/A 01/03/2016   Procedure: Dialysis/Perma Catheter Removal;  Surgeon: Belenda Cruise  Eloise Levels, MD;  Location: [facility] CV LAB;  Service: Cardiovascular;  Laterality: N/A;  . REVISON OF ARTERIOVENOUS FISTULA Left 10/28/2015   Procedure: REVISON OF ARTERIOVENOUS FISTULA WITH ARTEGRAFT;  Surgeon: Katha Cabal, MD;  Location: [facility] ORS;  Service: Vascular;   Laterality: Left;  . WOUND DEBRIDEMENT Left 10/28/2015   Procedure: Resection of shoulder cyst ( left );  Surgeon: Katha Cabal, MD;  Location: [facility] ORS;  Service: Vascular;  Laterality: Left;    OB History    Gravida Para Term Preterm AB Living   0 0 0 0 0 0   SAB TAB Ectopic Multiple Live Births   0 0 0 0        Obstetric Comments   1st Menstrual Cycle: 9 1st Pregnancy: NA       Home Medications    Prior to Admission medications   Medication Sig Start Date End Date Taking? Authorizing Provider  albuterol (PROVENTIL HFA;VENTOLIN HFA) 108 (90 BASE) MCG/ACT inhaler Inhale 2 puffs into the lungs every 6 (six) hours as needed for wheezing or shortness of breath.   Yes Historical Provider, MD  amLODipine (NORVASC) 10 MG tablet Take 10 mg by mouth at bedtime.   Yes Historical Provider, MD  calcium acetate (PHOSLO) 667 MG capsule Take 667-1,334 mg by mouth See admin instructions. Pt takes two capsules three times daily with meals and one capsule two times daily with snacks.   Yes Historical Provider, MD  calcium carbonate (OS-CAL) 600 MG TABS tablet Take 600 mg by mouth 2 (two) times daily with a meal.   Yes Historical Provider, MD  carvedilol (COREG) 25 MG tablet Take 1 tablet (25 mg total) by mouth 2 (two) times daily with a meal. Patient taking differently: Take 12.5 mg by mouth 2 (two) times daily with a meal.  04/26/16  Yes Sital Mody, MD  cinacalcet (SENSIPAR) 30 MG tablet Take 60 mg by mouth daily with breakfast.    Yes Historical Provider, MD  ezetimibe (ZETIA) 10 MG tablet Take 10 mg by mouth daily.    Yes Historical Provider, MD  fluticasone (FLONASE) 50 MCG/ACT nasal spray Place 2 sprays into both nostrils daily as needed for rhinitis.   Yes Historical Provider, MD  Fluticasone-Salmeterol (ADVAIR) 250-50 MCG/DOSE AEPB Inhale 1 puff into the lungs 2 (two) times daily.   Yes Historical Provider, MD  furosemide (LASIX) 40 MG tablet Take 40 mg by mouth every Tuesday, Thursday,  Saturday, and Sunday.    Yes Historical Provider, MD  lidocaine-prilocaine (EMLA) cream Apply 1 application topically as needed (prior to accessing port).   Yes Historical Provider, MD  losartan (COZAAR) 100 MG tablet Take 100 mg by mouth daily.    Yes Historical Provider, MD  multivitamin (RENA-VIT) TABS tablet Take 1 tablet by mouth at bedtime.    Yes Historical Provider, MD  pantoprazole (PROTONIX) 40 MG tablet Take 1 tablet (40 mg total) by mouth 2 (two) times daily. 06/02/16  Yes Demetrios Loll, MD  sertraline (ZOLOFT) 50 MG tablet Take 50 mg by mouth daily.    Yes Historical Provider, MD  ALPRAZolam Duanne Moron) 0.5 MG tablet Take 0.5 mg by mouth 2 (two) times daily.    Historical Provider, MD    Family History Family History  Problem Relation Age of Onset  . Heart disease Mother   . Cancer Father   . Cancer Sister     Social History Social History  Substance Use Topics  . Smoking status: Former Smoker  Packs/day: 0.50    Years: 40.00    Types: Cigarettes    Quit date: 11/23/2014  . Smokeless tobacco: Never Used  . Alcohol use No     Allergies   Sulfa antibiotics   Review of Systems Review of Systems  Respiratory: Positive for shortness of breath.   All other systems reviewed and are negative.    Physical Exam Updated Vital Signs BP (!) 166/152 (BP Location: Right Arm)   Pulse 68   Temp 97.7 F (36.5 C) (Oral)   Resp 16   Ht 5\' 5"  (1.651 m)   Wt 150 lb (68 kg)   SpO2 99%   BMI 24.96 kg/m   Physical Exam  Constitutional: She is oriented to person, place, and time.  Tachypneic, tripoding   HENT:  Head: Normocephalic.  Eyes: EOM are normal. Pupils are equal, round, and reactive to light.  Neck: Normal range of motion.  Cardiovascular: Regular rhythm and normal heart sounds.   Slightly tachycardic   Pulmonary/Chest:  Tachypneic, tripoding. Crackles bilateral bases   Abdominal: Soft. Bowel sounds are normal. She exhibits no distension. There is no tenderness.  There is no guarding.  Musculoskeletal:  1+ edema bilateral legs   Neurological: She is alert and oriented to person, place, and time.  Skin: Skin is warm.  Psychiatric: She has a normal mood and affect.  Nursing note and vitals reviewed.    ED Treatments / Results  Labs (all labs ordered are listed, but only abnormal results are displayed) Labs Reviewed  CBC WITH DIFFERENTIAL/PLATELET - Abnormal; Notable for the following:       Result Value   WBC 12.7 (*)    RBC 2.43 (*)    Hemoglobin 7.9 (*)    HCT 23.9 (*)    RDW 15.5 (*)    Neutro Abs 11.2 (*)    Lymphs Abs 0.7 (*)    All other components within normal limits  COMPREHENSIVE METABOLIC PANEL - Abnormal; Notable for the following:    Glucose, Bld 162 (*)    BUN 54 (*)    Creatinine, Ser 7.21 (*)    Calcium 7.0 (*)    Total Protein 6.1 (*)    Albumin 3.1 (*)    Alkaline Phosphatase 157 (*)    GFR calc non Af Amer 5 (*)    GFR calc Af Amer 6 (*)    All other components within normal limits  BRAIN NATRIURETIC PEPTIDE - Abnormal; Notable for the following:    B Natriuretic Peptide 664.0 (*)    All other components within normal limits  BLOOD GAS, ARTERIAL - Abnormal; Notable for the following:    pH, Arterial 7.26 (*)    pCO2 arterial 68 (*)    pO2, Arterial 409 (*)    Bicarbonate 30.5 (*)    All other components within normal limits  TROPONIN I - Abnormal; Notable for the following:    Troponin I 0.05 (*)    All other components within normal limits    EKG  EKG Interpretation None      ED ECG REPORT I, Wandra Arthurs, the attending physician, personally viewed and interpreted this ECG.   Date: 07/23/2016  EKG Time: 8:49 am  Rate: 99  Rhythm: normal EKG, normal sinus rhythm  Axis: normal  Intervals:none  ST&T Change: nonspecific    Radiology No results found.  Procedures Procedures (including critical care time)  CRITICAL CARE Performed by: Wandra Arthurs   Total critical care time: 80  minutes  Critical care time was exclusive of separately billable procedures and treating other patients.  Critical care was necessary to treat or prevent imminent or life-threatening deterioration.  Critical care was time spent personally by me on the following activities: development of treatment plan with patient and/or surrogate as well as nursing, discussions with consultants, evaluation of patient's response to treatment, examination of patient, obtaining history from patient or surrogate, ordering and performing treatments and interventions, ordering and review of laboratory studies, ordering and review of radiographic studies, pulse oximetry and re-evaluation of patient's condition.   Medications Ordered in ED Medications  ondansetron (ZOFRAN) 4 MG/2ML injection (not administered)  ondansetron (ZOFRAN) injection 4 mg (4 mg Intravenous Given 07/23/16 0843)  methylPREDNISolone sodium succinate (SOLU-MEDROL) 125 mg/2 mL injection 125 mg (125 mg Intravenous Given 07/23/16 1012)  ipratropium-albuterol (DUONEB) 0.5-2.5 (3) MG/3ML nebulizer solution 3 mL (3 mLs Nebulization Given 07/23/16 0853)     Initial Impression / Assessment and Plan / ED Course  I have reviewed the triage vital signs and the nursing notes.  Pertinent labs & imaging results that were available during my care of the patient were reviewed by me and considered in my medical decision making (see chart for details).  Clinical Course   Michele Nash is a 60 y.o. female here with SOB. Hx of COPD and she is a dialysis patient and this is her dialysis day. I am concerned for pulmonary edema vs COPD exacerbation. She is tripoding. Will put on bipap and give albuterol, steroids. Will get abg and likely consult nephrology to dialyze her urgently.   10:58 AM ABG showed pH 7.26 and CO2 68 on bipap. She is breathing more comfortably, doesn't need intubation currently. Will admit to ICU given respiratory acidosis from likely pulmonary  edema. I called Dr. Candiss Norse from nephrology who will dialyze patient in the ED.    Final Clinical Impressions(s) / ED Diagnoses   Final diagnoses:  None    New Prescriptions New Prescriptions   No medications on file     Drenda Freeze, MD 07/23/16 1100

## 2016-07-23 NOTE — Progress Notes (Signed)
   07/23/16 1900  Clinical Encounter Type  Visited With Patient  Visit Type Initial;Critical Care  Referral From Nurse  Consult/Referral To Chaplain  Spiritual Encounters  Spiritual Needs Prayer;Emotional  Stress Factors  Patient Stress Factors Exhausted;Health changes  Visited w/patient and reviewed family history. Patient expressed that her breathing is greatly improved and she is not anxious. She requested prayer for a peaceful night and appeared to be sleeping as I left the room after prayer. Chap. Ilda Laskin G. [place]

## 2016-07-23 NOTE — Progress Notes (Signed)
END OF HD 

## 2016-07-23 NOTE — Progress Notes (Signed)
Hd start 

## 2016-07-23 NOTE — Progress Notes (Signed)
Notified Dr. Chaya Jan at [facility] of patient's positive troponin of .2.  No new orders.

## 2016-07-23 NOTE — Progress Notes (Signed)
Post hd vitals 

## 2016-07-23 NOTE — Care Management (Addendum)
Patient presented to the ED with shortness of breath.  She is currently requiring continuous bipap.  Has chronic home 02 in the home through [place].  She receives HD at [facility] on [address]  She has not not missed any appointments Notified clinic of admission.. No transportation issues or issues accessing medical care.  She has received home health through [facility] in the past and would be agreeable to receive services again.  She is a readmission.  Heads up referral for SN to [facility] per patient/husband request

## 2016-07-23 NOTE — Progress Notes (Signed)
POST HD ASSESSMENT 

## 2016-07-24 ENCOUNTER — Inpatient Hospital Stay: Payer: Medicare Other

## 2016-07-24 DIAGNOSIS — J9601 Acute respiratory failure with hypoxia: Secondary | ICD-10-CM

## 2016-07-24 LAB — CBC
HEMATOCRIT: 22.7 % — AB (ref 35.0–47.0)
Hemoglobin: 8.1 g/dL — ABNORMAL LOW (ref 12.0–16.0)
MCH: 34.9 pg — ABNORMAL HIGH (ref 26.0–34.0)
MCHC: 35.6 g/dL (ref 32.0–36.0)
MCV: 98.2 fL (ref 80.0–100.0)
PLATELETS: 306 10*3/uL (ref 150–440)
RBC: 2.32 MIL/uL — ABNORMAL LOW (ref 3.80–5.20)
RDW: 15.4 % — AB (ref 11.5–14.5)
WBC: 7.6 10*3/uL (ref 3.6–11.0)

## 2016-07-24 LAB — BASIC METABOLIC PANEL
ANION GAP: 13 (ref 5–15)
BUN: 47 mg/dL — ABNORMAL HIGH (ref 6–20)
CALCIUM: 7.3 mg/dL — AB (ref 8.9–10.3)
CO2: 29 mmol/L (ref 22–32)
CREATININE: 5.28 mg/dL — AB (ref 0.44–1.00)
Chloride: 95 mmol/L — ABNORMAL LOW (ref 101–111)
GFR, EST AFRICAN AMERICAN: 9 mL/min — AB (ref >60)
GFR, EST NON AFRICAN AMERICAN: 8 mL/min — AB (ref >60)
GLUCOSE: 171 mg/dL — AB (ref 65–99)
Potassium: 4.4 mmol/L (ref 3.5–5.1)
Sodium: 137 mmol/L (ref 135–145)

## 2016-07-24 LAB — BLOOD GAS, ARTERIAL
Acid-Base Excess: 2.7 mmol/L (ref 0.0–3.0)
Allens test (pass/fail): POSITIVE — AB
BICARBONATE: 30.5 meq/L — AB (ref 21.0–28.0)
DELIVERY SYSTEMS: POSITIVE
Expiratory PAP: 6
FIO2: 1
Inspiratory PAP: 12
O2 Saturation: 100 %
Patient temperature: 37
RATE: 8 resp/min
pCO2 arterial: 68 mmHg — ABNORMAL HIGH (ref 32.0–48.0)
pH, Arterial: 7.26 — ABNORMAL LOW (ref 7.350–7.450)
pO2, Arterial: 409 mmHg — ABNORMAL HIGH (ref 83.0–108.0)

## 2016-07-24 LAB — TROPONIN I: Troponin I: 0.18 ng/mL (ref <0.03)

## 2016-07-24 LAB — GLUCOSE, CAPILLARY: Glucose-Capillary: 254 mg/dL — ABNORMAL HIGH (ref 65–99)

## 2016-07-24 LAB — HEPATITIS B SURFACE ANTIGEN: HEP B S AG: NEGATIVE

## 2016-07-24 MED ORDER — INSULIN ASPART 100 UNIT/ML ~~LOC~~ SOLN: 3.0000 [IU] | Freq: Three times a day (TID) | SUBCUTANEOUS | Status: DC

## 2016-07-24 MED ORDER — HEPARIN SODIUM (PORCINE) 1000 UNIT/ML DIALYSIS: 20.0000 [IU]/kg | INTRAMUSCULAR | Status: DC | PRN

## 2016-07-24 MED ORDER — INSULIN ASPART 100 UNIT/ML ~~LOC~~ SOLN: 0.0000 [IU] | Freq: Three times a day (TID) | SUBCUTANEOUS | Status: DC

## 2016-07-24 MED ORDER — LOSARTAN POTASSIUM 50 MG PO TABS: 100.0000 mg | ORAL_TABLET | Freq: Every day | ORAL | Status: DC

## 2016-07-24 MED ORDER — INSULIN ASPART 100 UNIT/ML ~~LOC~~ SOLN: 0.0000 [IU] | Freq: Every day | SUBCUTANEOUS | Status: DC

## 2016-07-24 MED ORDER — EPOETIN ALFA 10000 UNIT/ML IJ SOLN
10000.0000 [IU] | Freq: Once | INTRAMUSCULAR | Status: AC
  Administered 2016-07-24: 10000 [IU] via INTRAVENOUS
  Filled 2016-07-24: qty 1

## 2016-07-24 NOTE — Progress Notes (Signed)
Pt to dialysis via transporter [employer]

## 2016-07-24 NOTE — Progress Notes (Addendum)
Pt returned from dialysis. 2.4 liters removed during treatment

## 2016-07-24 NOTE — Progress Notes (Signed)
Post dialysis 

## 2016-07-24 NOTE — Progress Notes (Signed)
Dialysis started 

## 2016-07-24 NOTE — Progress Notes (Signed)
Pt refused the full 10mg  of norvasc at bedtime. Pt is requesting the remaining 5 mg this morning due to "that's how I take it at home".

## 2016-07-24 NOTE — Progress Notes (Signed)
Pre Dialysis 

## 2016-07-24 NOTE — Discharge Instructions (Signed)
Chronic Kidney Disease °Chronic kidney disease occurs when the kidneys are damaged over a long period. The kidneys are two organs that lie on either side of the spine between the middle of the back and the front of the abdomen. The kidneys: °· Remove wastes and extra water from the blood. °· Produce important hormones. These help keep bones strong, regulate blood pressure, and help create red blood cells. °· Balance the fluids and chemicals in the blood and tissues. °A small amount of kidney damage may not cause problems, but a large amount of damage may make it difficult or impossible for the kidneys to work the way they should. If steps are not taken to slow down the kidney damage or stop it from getting worse, the kidneys may stop working permanently. Most of the time, chronic kidney disease does not go away. However, it can often be controlled, and those with the disease can usually live normal lives. °CAUSES °The most common causes of chronic kidney disease are diabetes and high blood pressure (hypertension). Chronic kidney disease may also be caused by: °· Diseases that cause the kidneys' filters to become inflamed. °· Diseases that affect the immune system. °· Genetic diseases. °· Medicines that damage the kidneys, such as anti-inflammatory medicines. °· Poisoning or exposure to toxic substances. °· A reoccurring kidney or urinary infection. °· A problem with urine flow. This may be caused by: °¨ Cancer. °¨ Kidney stones. °¨ An enlarged prostate in males. °SIGNS AND SYMPTOMS °Because the kidney damage in chronic kidney disease occurs slowly, symptoms develop slowly and may not be obvious until the kidney damage becomes severe. A person may have a kidney disease for years without showing any symptoms. Symptoms can include: °· Swelling (edema) of the legs, ankles, or feet. °· Tiredness (lethargy). °· Nausea or vomiting. °· Confusion. °· Problems with urination, such as: °¨ Decreased urine  production. °¨ Frequent urination, especially at night. °¨ Frequent accidents in children who are potty trained. °· Muscle twitches and cramps. °· Shortness of breath. °· Weakness. °· Persistent itchiness. °· Loss of appetite. °· Metallic taste in the mouth. °· Trouble sleeping. °· Slowed development in children. °· Short stature in children. °DIAGNOSIS °Chronic kidney disease may be detected and diagnosed by tests, including blood, urine, imaging, or kidney biopsy tests. °TREATMENT °Most chronic kidney diseases cannot be cured. Treatment usually involves relieving symptoms and preventing or slowing the progression of the disease. Treatment may include: °· A special diet. You may need to avoid alcohol and foods that are salty and high in potassium. °· Medicines. These may: °¨ Lower blood pressure. °¨ Relieve anemia. °¨ Relieve swelling. °¨ Protect the bones. °HOME CARE INSTRUCTIONS °· Follow your prescribed diet.  Your health care provider may instruct you to limit daily salt (sodium) and protein intake. °· Take medicines only as directed by your health care provider. Do not take any new medicines (prescription, over-the-counter, or nutritional supplements) unless approved by your health care provider. Many medicines can worsen your kidney damage or need to have the dose adjusted.   °· Quit smoking if you smoke. Talk to your health care provider about a smoking cessation program. °· Keep all follow-up visits as directed by your health care provider. °· Monitor your blood pressure. °· Start or continue an exercise plan. °· Get immunizations as directed by your health care provider. °· Take vitamin and mineral supplements as directed by your health care provider. °SEEK IMMEDIATE MEDICAL CARE IF: °· Your symptoms get worse or you develop   new symptoms. °· You develop symptoms of end-stage kidney disease. These include: °¨ Headaches. °¨ Abnormally dark or light skin. °¨ Numbness in the hands or feet. °¨ Easy  bruising. °¨ Frequent hiccups. °¨ Menstruation stops. °· You have a fever. °· You have decreased urine production. °· You have pain or bleeding when urinating. °MAKE SURE YOU: °· Understand these instructions. °· Will watch your condition. °· Will get help right away if you are not doing well or get worse. °FOR MORE INFORMATION  °· American Association of Kidney Patients: www.aakp.org °· National Kidney Foundation: www.kidney.org °· American Kidney Fund: www.akfinc.org °· [employer] Program: www.lifeoptions.org and www.kidneyschool.org °  °This information is not intended to replace advice given to you by your health care provider. Make sure you discuss any questions you have with your health care provider. °  °Document Released: 08/21/2008 Document Revised: 12/03/2014 Document Reviewed: 07/11/2012 °Elsevier Interactive Patient Education ©2016 Elsevier Inc. ° °

## 2016-07-24 NOTE — Progress Notes (Signed)
Pt discharged to self. Pt has no complaints at this time. Pt has family at the bedside that will be accompanying pt home. Belongings released to pt and pt shows no distress. Discharge information and education reviewed with pt and pt verbalizes understanding of same.

## 2016-07-24 NOTE — Progress Notes (Signed)
Dialysis terminated

## 2016-07-24 NOTE — Progress Notes (Signed)
[facility]  ROUNDING NOTE   Subjective:  2800 cc removed with HD yesterday Still feels SOB and requiring O2 CXR shows residual pulm edema  No N/V Eating breakfast this morning   Objective:  Vital signs in last 24 hours:  Temp:  [97.6 F (36.4 C)-98.3 F (36.8 C)] 97.7 F (36.5 C) (08/29 0500) Pulse Rate:  [65-96] 66 (08/29 0650) Resp:  [12-31] 12 (08/29 0650) BP: (111-181)/(54-152) 114/86 (08/29 0600) SpO2:  [88 %-100 %] 100 % (08/29 0813) FiO2 (%):  [30 %] 30 % (08/29 0400) Weight:  [63.7 kg (140 lb 6.9 oz)-64.6 kg (142 lb 6.7 oz)] 63.7 kg (140 lb 6.9 oz) (08/29 0454)  Weight change:  Filed Weights   07/23/16 0858 07/23/16 1731 07/24/16 0454  Weight: 68 kg (150 lb) 64.6 kg (142 lb 6.7 oz) 63.7 kg (140 lb 6.9 oz)    Intake/Output: I/O last 3 completed shifts: In: -  Out: 2800 [Other:2800]   Intake/Output this shift:  No intake/output data recorded.  Physical Exam: General: No acute distress  Head: Normocephalic, atraumatic. Moist oral mucosal membranes  Eyes: Anicteric  Neck: Supple, trachea midline  Lungs:  Crackles b/l, normal effort  Heart: S1S2 no rubs  Abdomen:  Soft, nontender, Bowel sounds present   Extremities: Trace peripheral edema.  Neurologic: Awake, alert, follows commands   Skin: No lesions  Access: LUE AVF    Basic Metabolic Panel:  Recent Labs Lab 07/23/16 0943 07/23/16 1805 07/24/16 0313  NA 140  --  137  K 4.7  --  4.4  CL 104  --  95*  CO2 27  --  29  GLUCOSE 162*  --  171*  BUN 54*  --  47*  CREATININE 7.21*  --  5.28*  CALCIUM 7.0*  --  7.3*  PHOS  --  3.3  --     Liver Function Tests:  Recent Labs Lab 07/23/16 0943  AST 35  ALT 21  ALKPHOS 157*  BILITOT 0.4  PROT 6.1*  ALBUMIN 3.1*   No results for input(s): LIPASE, AMYLASE in the last 168 hours. No results for input(s): AMMONIA in the last 168 hours.  CBC:  Recent Labs Lab 07/23/16 0943 07/24/16 0313  WBC 12.7* 7.6  NEUTROABS 11.2*  --    HGB 7.9* 8.1*  HCT 23.9* 22.7*  MCV 98.3 98.2  PLT 293 306    Cardiac Enzymes:  Recent Labs Lab 07/23/16 0943 07/23/16 1805 07/23/16 2322  TROPONINI 0.05* 0.20* 0.18*    BNP: Invalid input(s): POCBNP  CBG:  Recent Labs Lab 07/23/16 1732 07/23/16 1736  GLUCAP 496* 24*    Microbiology: Results for orders placed or performed during the hospital encounter of 07/23/16  MRSA PCR Screening     Status: None   Collection Time: 07/23/16  5:38 PM  Result Value Ref Range Status   MRSA by PCR NEGATIVE NEGATIVE Final    Comment:        The GeneXpert MRSA Assay (FDA approved for NASAL specimens only), is one component of a comprehensive MRSA colonization surveillance program. It is not intended to diagnose MRSA infection nor to guide or monitor treatment for MRSA infections.     Coagulation Studies: No results for input(s): LABPROT, INR in the last 72 hours.  Urinalysis: No results for input(s): COLORURINE, LABSPEC, PHURINE, GLUCOSEU, HGBUR, BILIRUBINUR, KETONESUR, PROTEINUR, UROBILINOGEN, NITRITE, LEUKOCYTESUR in the last 72 hours.  Invalid input(s): APPERANCEUR    Imaging: Dg Chest Port 1 View  Result  Date: 07/24/2016 CLINICAL DATA:  COPD and bronchitis.  Respiratory distress. EXAM: PORTABLE CHEST 1 VIEW COMPARISON:  Chest radiograph 07/23/2016 FINDINGS: Cardiomediastinal silhouette remains enlarged. The left hemidiaphragm border remains obscured. Pulmonary interstitial edema has decreased slightly. IMPRESSION: Slightly decreased pulmonary edema. Otherwise unchanged examination. Electronically Signed   By: Ulyses Jarred M.D.   On: 07/24/2016 06:14   Dg Chest Port 1 View  Result Date: 07/23/2016 CLINICAL DATA:  Shortness of breath, on BiPAP, COPD, end-stage renal disease on dialysis, GERD EXAM: PORTABLE CHEST 1 VIEW COMPARISON:  Portable exam 1054 hours compared to 05/02/2016 FINDINGS: Enlargement of cardiac silhouette with pulmonary vascular congestion. Tortuous  thoracic aorta. Mild diffuse interstitial edema compatible with CHF or fluid overload. Question tiny bibasilar pleural effusions. No pneumothorax. Bones demineralized. IMPRESSION: Enlargement of cardiac silhouette with pulmonary vascular congestion and diffuse interstitial edema question CHF versus fluid overload. Electronically Signed   By: [name] M.D.   On: 07/23/2016 11:10     Medications:       Assessment/ Plan:  60 y.o. female with pmhx of HTN, hyperlipidemia, diastolic heart failure, hypertension, COPD, tobacco abuse, AOCD, SHPTH, LUE AVF, ESRD 02/2013, admission for severe anemia/black stools 05/28/16, nonbleeding gastric ulcers on EGD 8/181/17  [facility]/MWF  1. End-stage renal disease on hemodialysis MWF.  - Extra HD today for volume optimization - outpatient EDW 60 kg. Current 63.7 kg  - goal UF 2.5-3 as tolerated  2.  Acute Pulm edema - urgent HD . UF goal as tolerated  3. Secondary hyperparathyroidism.  Monitor Phos Currently 3.3  4. AOCKD - Hgb 8.1 - EPO with treatment    LOS: 1 Michele Nash 8/29/20179:07 AM

## 2016-07-24 NOTE — Progress Notes (Signed)
Attempted to discharge pt and review discharge instructions. Pt refused, stating that she wanted to eat dinner before she leaves and will allow the RN to go over her paperwork after she eats. Pt also refused medications until after dinner. Will report off same to on-coming RN.

## 2016-07-24 NOTE — Discharge Summary (Addendum)
Physician Discharge Summary  Patient ID: Michele Nash MRN: [id number] DOB/AGE: Jun 07, 1956 60 y.o.  Admit date: 07/23/2016 Discharge date: 07/24/2016    Discharge Diagnoses:  Acute on chronic hypoxic respiratory failure due to pulmonary edema End stage Renal disease -M-W-F Hyperglycemia    DISCHARGE PLAN BY DIAGNOSIS    -Continue Hemodialysis M-W-F -Follow with your PCP for elevated blood glucose               DISCHARGE SUMMARY  Michele Nash is a 60 year old female with Hx of COPD,ESRD on HD(Last HD on Friday), Depression, GERD, Hyperlipidemia, Hypertension and obesity. Patient presented to [facility] on 8/28 with shortness of breath.  Patient was dialyzed on Friday(07/20/16) but was unable to get to dialysis centeron 8/28 due to shortness of breath. Patient was placed on BiPAP and  and CXR was concerning for pulmonary edema.  Patient was observed overnight.  Patient was dialyzed on 8/29 and was discharged .        SIGNIFICANT DIAGNOSTIC STUDIES None   SIGNIFICANT EVENTS none MICRO DATA  none  ANTIBIOTICS none  CONSULTS 8/28 nephro consult>>  TUBES / LINES none   Discharge Exam: General: 60 years female, in no acute distress   Neuro:  Awke, alert. Oriented, follows command HEENT:  Atraumatic, normocephalic. No discharge, no JVD appreciated Cardiovascular:  S1S2, RRR, no MRG noted Lungs: Crackles bilateral bases, no wheezes, rhonchi noted Abdomen:  Soft, non tender, active bowel sounds Musculoskeletal:  +1 edema bilateral legs Skin:  Grossly intact.   Vitals:   07/24/16 1630 07/24/16 1700 07/24/16 1725 07/24/16 1731  BP: (!) 165/71 (!) 151/65 139/76 (!) 159/97  Pulse: 74 70 70 79  Resp: (!) 25 17 (!) 26 (!) 21  Temp:    98.2 F (36.8 C)  TempSrc:    Oral  SpO2: 100% 100% 100% 100%  Weight:    149 lb 14.6 oz (68 kg)  Height:         Discharge Labs  BMET  Recent Labs Lab 07/23/16 0943 07/23/16 1805 07/24/16 0313  NA 140  --  137  K 4.7  --  4.4  CL  104  --  95*  CO2 27  --  29  GLUCOSE 162*  --  171*  BUN 54*  --  47*  CREATININE 7.21*  --  5.28*  CALCIUM 7.0*  --  7.3*  PHOS  --  3.3  --     CBC  Recent Labs Lab 07/23/16 0943 07/24/16 0313  HGB 7.9* 8.1*  HCT 23.9* 22.7*  WBC 12.7* 7.6  PLT 293 306    Anti-Coagulation No results for input(s): INR in the last 168 hours.  Discharge Instructions    Diet - low sodium heart healthy    Complete by:  As directed   Discharge instructions    Complete by:  As directed   Please make appointment with your PCP with in two weeks of discharge.   Increase activity slowly    Complete by:  As directed        Disposition: Home  Discharged Condition: Michele Nash has met maximum benefit of inpatient care and is medically stable and cleared for discharge.  Patient is pending follow up as above.      Time spent on disposition:  Greater than 45 minutes.    [place], MD PCCM service Mobile [phone] Pager [phone] 07/28/2016

## 2016-07-25 LAB — HEMOGLOBIN A1C

## 2016-07-26 LAB — MISC LABCORP TEST (SEND OUT): SOURCE LABCORP: 1453

## 2016-09-13 ENCOUNTER — Ambulatory Visit
Admission: RE | Admit: 2016-09-13 | Discharge: 2016-09-13 | Disposition: A | Discharge status: Home / Self Care | Payer: Medicare Other | Source: Ambulatory Visit | Attending: Internal Medicine | Admitting: Internal Medicine

## 2016-09-14 ENCOUNTER — Inpatient Hospital Stay: Payer: Medicare Other

## 2016-09-14 ENCOUNTER — Emergency Department: Payer: Medicare Other

## 2016-09-14 ENCOUNTER — Inpatient Hospital Stay
Admission: EM | Admit: 2016-09-14 | Discharge: 2016-09-16 | DRG: 291 | Disposition: A | Discharge status: Home / Self Care | Payer: Medicare Other | Attending: Internal Medicine | Admitting: Internal Medicine

## 2016-09-14 DIAGNOSIS — E1165 Type 2 diabetes mellitus with hyperglycemia: Secondary | ICD-10-CM | POA: Diagnosis present

## 2016-09-14 DIAGNOSIS — N2581 Secondary hyperparathyroidism of renal origin: Secondary | ICD-10-CM | POA: Diagnosis present

## 2016-09-14 DIAGNOSIS — I161 Hypertensive emergency: Secondary | ICD-10-CM | POA: Diagnosis not present

## 2016-09-14 DIAGNOSIS — E872 Acidosis: Secondary | ICD-10-CM | POA: Diagnosis present

## 2016-09-14 DIAGNOSIS — I5030 Unspecified diastolic (congestive) heart failure: Secondary | ICD-10-CM | POA: Diagnosis present

## 2016-09-14 DIAGNOSIS — Z7951 Long term (current) use of inhaled steroids: Secondary | ICD-10-CM | POA: Diagnosis not present

## 2016-09-14 DIAGNOSIS — Z882 Allergy status to sulfonamides status: Secondary | ICD-10-CM

## 2016-09-14 DIAGNOSIS — E669 Obesity, unspecified: Secondary | ICD-10-CM | POA: Diagnosis present

## 2016-09-14 DIAGNOSIS — K219 Gastro-esophageal reflux disease without esophagitis: Secondary | ICD-10-CM | POA: Diagnosis present

## 2016-09-14 DIAGNOSIS — J811 Chronic pulmonary edema: Secondary | ICD-10-CM

## 2016-09-14 DIAGNOSIS — G9341 Metabolic encephalopathy: Secondary | ICD-10-CM | POA: Diagnosis present

## 2016-09-14 DIAGNOSIS — I132 Hypertensive heart and chronic kidney disease with heart failure and with stage 5 chronic kidney disease, or end stage renal disease: Secondary | ICD-10-CM | POA: Diagnosis present

## 2016-09-14 DIAGNOSIS — E1122 Type 2 diabetes mellitus with diabetic chronic kidney disease: Secondary | ICD-10-CM | POA: Diagnosis present

## 2016-09-14 DIAGNOSIS — J810 Acute pulmonary edema: Secondary | ICD-10-CM | POA: Diagnosis present

## 2016-09-14 DIAGNOSIS — Z992 Dependence on renal dialysis: Secondary | ICD-10-CM | POA: Diagnosis not present

## 2016-09-14 DIAGNOSIS — E785 Hyperlipidemia, unspecified: Secondary | ICD-10-CM | POA: Diagnosis present

## 2016-09-14 DIAGNOSIS — N186 End stage renal disease: Secondary | ICD-10-CM | POA: Diagnosis present

## 2016-09-14 DIAGNOSIS — J441 Chronic obstructive pulmonary disease with (acute) exacerbation: Secondary | ICD-10-CM | POA: Diagnosis present

## 2016-09-14 DIAGNOSIS — Z9889 Other specified postprocedural states: Secondary | ICD-10-CM | POA: Diagnosis not present

## 2016-09-14 DIAGNOSIS — Z90710 Acquired absence of both cervix and uterus: Secondary | ICD-10-CM

## 2016-09-14 DIAGNOSIS — F329 Major depressive disorder, single episode, unspecified: Secondary | ICD-10-CM | POA: Diagnosis present

## 2016-09-14 DIAGNOSIS — J9622 Acute and chronic respiratory failure with hypercapnia: Secondary | ICD-10-CM | POA: Diagnosis present

## 2016-09-14 DIAGNOSIS — F17210 Nicotine dependence, cigarettes, uncomplicated: Secondary | ICD-10-CM | POA: Diagnosis present

## 2016-09-14 DIAGNOSIS — Z79899 Other long term (current) drug therapy: Secondary | ICD-10-CM

## 2016-09-14 DIAGNOSIS — Z809 Family history of malignant neoplasm, unspecified: Secondary | ICD-10-CM

## 2016-09-14 DIAGNOSIS — Z8711 Personal history of peptic ulcer disease: Secondary | ICD-10-CM

## 2016-09-14 DIAGNOSIS — G4733 Obstructive sleep apnea (adult) (pediatric): Secondary | ICD-10-CM | POA: Diagnosis present

## 2016-09-14 DIAGNOSIS — I248 Other forms of acute ischemic heart disease: Secondary | ICD-10-CM | POA: Diagnosis present

## 2016-09-14 DIAGNOSIS — I160 Hypertensive urgency: Secondary | ICD-10-CM | POA: Diagnosis not present

## 2016-09-14 DIAGNOSIS — D631 Anemia in chronic kidney disease: Secondary | ICD-10-CM | POA: Diagnosis present

## 2016-09-14 DIAGNOSIS — J9621 Acute and chronic respiratory failure with hypoxia: Secondary | ICD-10-CM | POA: Diagnosis present

## 2016-09-14 DIAGNOSIS — Z8249 Family history of ischemic heart disease and other diseases of the circulatory system: Secondary | ICD-10-CM

## 2016-09-14 DIAGNOSIS — I5033 Acute on chronic diastolic (congestive) heart failure: Secondary | ICD-10-CM | POA: Diagnosis present

## 2016-09-14 DIAGNOSIS — R109 Unspecified abdominal pain: Secondary | ICD-10-CM

## 2016-09-14 DIAGNOSIS — J9601 Acute respiratory failure with hypoxia: Secondary | ICD-10-CM | POA: Diagnosis not present

## 2016-09-14 HISTORY — DX: Chronic pulmonary edema: J81.1

## 2016-09-14 LAB — CBC
HCT: 36.9 % (ref 35.0–47.0)
Hemoglobin: 11.9 g/dL — ABNORMAL LOW (ref 12.0–16.0)
MCH: 31.6 pg (ref 26.0–34.0)
MCHC: 32.2 g/dL (ref 32.0–36.0)
MCV: 98.2 fL (ref 80.0–100.0)
PLATELETS: 354 10*3/uL (ref 150–440)
RBC: 3.75 MIL/uL — AB (ref 3.80–5.20)
RDW: 17.7 % — ABNORMAL HIGH (ref 11.5–14.5)
WBC: 11 10*3/uL (ref 3.6–11.0)

## 2016-09-14 LAB — RENAL FUNCTION PANEL
ALBUMIN: 3.8 g/dL (ref 3.5–5.0)
ANION GAP: 11 (ref 5–15)
Albumin: 3.5 g/dL (ref 3.5–5.0)
Anion gap: 11 (ref 5–15)
BUN: 51 mg/dL — ABNORMAL HIGH (ref 6–20)
BUN: 19 mg/dL (ref 6–20)
CHLORIDE: 96 mmol/L — AB (ref 101–111)
CO2: 35 mmol/L — AB (ref 22–32)
CO2: 34 mmol/L — ABNORMAL HIGH (ref 22–32)
CREATININE: 4.15 mg/dL — AB (ref 0.44–1.00)
Calcium: 8.3 mg/dL — ABNORMAL LOW (ref 8.9–10.3)
Calcium: 9 mg/dL (ref 8.9–10.3)
Chloride: 96 mmol/L — ABNORMAL LOW (ref 101–111)
Creatinine, Ser: 7.74 mg/dL — ABNORMAL HIGH (ref 0.44–1.00)
GFR calc non Af Amer: 5 mL/min — ABNORMAL LOW (ref >60)
GFR, EST AFRICAN AMERICAN: 6 mL/min — AB (ref >60)
GFR, EST AFRICAN AMERICAN: 12 mL/min — AB (ref >60)
GFR, EST NON AFRICAN AMERICAN: 11 mL/min — AB (ref >60)
GLUCOSE: 162 mg/dL — AB (ref 65–99)
Glucose, Bld: 215 mg/dL — ABNORMAL HIGH (ref 65–99)
PHOSPHORUS: 3.2 mg/dL (ref 2.5–4.6)
POTASSIUM: 5.1 mmol/L (ref 3.5–5.1)
POTASSIUM: 3.9 mmol/L (ref 3.5–5.1)
Phosphorus: 4.3 mg/dL (ref 2.5–4.6)
Sodium: 142 mmol/L (ref 135–145)
Sodium: 141 mmol/L (ref 135–145)

## 2016-09-14 LAB — CBC WITH DIFFERENTIAL/PLATELET
Basophils Absolute: 0 10*3/uL (ref 0–0.1)
Basophils Relative: 0 %
EOS PCT: 0 %
Eosinophils Absolute: 0 10*3/uL (ref 0–0.7)
HEMATOCRIT: 36.8 % (ref 35.0–47.0)
HEMOGLOBIN: 11.5 g/dL — AB (ref 12.0–16.0)
LYMPHS ABS: 0.3 10*3/uL — AB (ref 1.0–3.6)
LYMPHS PCT: 6 %
MCH: 30.8 pg (ref 26.0–34.0)
MCHC: 31.2 g/dL — ABNORMAL LOW (ref 32.0–36.0)
MCV: 98.7 fL (ref 80.0–100.0)
Monocytes Absolute: 0.2 10*3/uL (ref 0.2–0.9)
Monocytes Relative: 4 %
NEUTROS ABS: 4.2 10*3/uL (ref 1.4–6.5)
Neutrophils Relative %: 90 %
Platelets: 280 10*3/uL (ref 150–440)
RBC: 3.73 MIL/uL — AB (ref 3.80–5.20)
RDW: 16.9 % — ABNORMAL HIGH (ref 11.5–14.5)
WBC: 4.7 10*3/uL (ref 3.6–11.0)

## 2016-09-14 LAB — COMPREHENSIVE METABOLIC PANEL
ALBUMIN: 3.8 g/dL (ref 3.5–5.0)
ALK PHOS: 186 U/L — AB (ref 38–126)
ALT: 33 U/L (ref 14–54)
ALT: 30 U/L (ref 14–54)
AST: 43 U/L — AB (ref 15–41)
AST: 35 U/L (ref 15–41)
Albumin: 3.6 g/dL (ref 3.5–5.0)
Alkaline Phosphatase: 233 U/L — ABNORMAL HIGH (ref 38–126)
Anion gap: 12 (ref 5–15)
Anion gap: 11 (ref 5–15)
BUN: 47 mg/dL — AB (ref 6–20)
BUN: 24 mg/dL — AB (ref 6–20)
CALCIUM: 8.4 mg/dL — AB (ref 8.9–10.3)
CHLORIDE: 94 mmol/L — AB (ref 101–111)
CO2: 35 mmol/L — AB (ref 22–32)
CO2: 35 mmol/L — AB (ref 22–32)
CREATININE: 7.56 mg/dL — AB (ref 0.44–1.00)
CREATININE: 4.77 mg/dL — AB (ref 0.44–1.00)
Calcium: 8.3 mg/dL — ABNORMAL LOW (ref 8.9–10.3)
Chloride: 91 mmol/L — ABNORMAL LOW (ref 101–111)
GFR calc Af Amer: 6 mL/min — ABNORMAL LOW (ref >60)
GFR calc non Af Amer: 5 mL/min — ABNORMAL LOW (ref >60)
GFR, EST AFRICAN AMERICAN: 10 mL/min — AB (ref >60)
GFR, EST NON AFRICAN AMERICAN: 9 mL/min — AB (ref >60)
GLUCOSE: 287 mg/dL — AB (ref 65–99)
Glucose, Bld: 514 mg/dL (ref 65–99)
POTASSIUM: 4.6 mmol/L (ref 3.5–5.1)
Potassium: 4.2 mmol/L (ref 3.5–5.1)
SODIUM: 141 mmol/L (ref 135–145)
SODIUM: 137 mmol/L (ref 135–145)
Total Bilirubin: 0.8 mg/dL (ref 0.3–1.2)
Total Bilirubin: 0.6 mg/dL (ref 0.3–1.2)
Total Protein: 7.4 g/dL (ref 6.5–8.1)
Total Protein: 6.9 g/dL (ref 6.5–8.1)

## 2016-09-14 LAB — BLOOD GAS, VENOUS
DELIVERY SYSTEMS: POSITIVE
FIO2: 0.5
PO2 VEN: 36 mmHg (ref 32.0–45.0)
Patient temperature: 37
pH, Ven: 7.1 — CL (ref 7.250–7.430)

## 2016-09-14 LAB — BLOOD GAS, ARTERIAL
ACID-BASE EXCESS: 12.6 mmol/L — AB (ref 0.0–2.0)
BICARBONATE: 43.1 mmol/L — AB (ref 20.0–28.0)
EXPIRATORY PAP: 8
FIO2: 0.5
Inspiratory PAP: 16
O2 Saturation: 93.4 %
PATIENT TEMPERATURE: 37
PH ART: 7.26 — AB (ref 7.350–7.450)
pCO2 arterial: 96 mmHg (ref 32.0–48.0)
pO2, Arterial: 78 mmHg — ABNORMAL LOW (ref 83.0–108.0)

## 2016-09-14 LAB — GLUCOSE, CAPILLARY
GLUCOSE-CAPILLARY: 107 mg/dL — AB (ref 65–99)
GLUCOSE-CAPILLARY: 397 mg/dL — AB (ref 65–99)
GLUCOSE-CAPILLARY: 117 mg/dL — AB (ref 65–99)
Glucose-Capillary: 162 mg/dL — ABNORMAL HIGH (ref 65–99)

## 2016-09-14 LAB — MAGNESIUM: Magnesium: 1.6 mg/dL — ABNORMAL LOW (ref 1.7–2.4)

## 2016-09-14 LAB — PHOSPHORUS: Phosphorus: 3.3 mg/dL (ref 2.5–4.6)

## 2016-09-14 LAB — TROPONIN I
Troponin I: 0.05 ng/mL (ref <0.03)
Troponin I: 0.05 ng/mL (ref <0.03)
Troponin I: 0.07 ng/mL (ref <0.03)
Troponin I: 0.06 ng/mL (ref <0.03)

## 2016-09-14 LAB — MRSA PCR SCREENING: MRSA by PCR: NEGATIVE

## 2016-09-14 LAB — CK: Total CK: 133 U/L (ref 38–234)

## 2016-09-14 LAB — BRAIN NATRIURETIC PEPTIDE
B NATRIURETIC PEPTIDE 5: 329 pg/mL — AB (ref 0.0–100.0)
B Natriuretic Peptide: 457 pg/mL — ABNORMAL HIGH (ref 0.0–100.0)

## 2016-09-14 MED ORDER — AMLODIPINE BESYLATE 5 MG PO TABS
10.0000 mg | ORAL_TABLET | Freq: Every day | ORAL | Status: DC
  Administered 2016-09-14: 10 mg via ORAL
  Filled 2016-09-14: qty 2

## 2016-09-14 MED ORDER — ALBUTEROL SULFATE (2.5 MG/3ML) 0.083% IN NEBU
2.5000 mg | INHALATION_SOLUTION | RESPIRATORY_TRACT | Status: DC | PRN
  Administered 2016-09-15: 2.5 mg via RESPIRATORY_TRACT
  Filled 2016-09-14: qty 3

## 2016-09-14 MED ORDER — CARVEDILOL 6.25 MG PO TABS
12.5000 mg | ORAL_TABLET | Freq: Two times a day (BID) | ORAL | Status: DC
  Administered 2016-09-14 – 2016-09-16 (×4): 12.5 mg via ORAL
  Filled 2016-09-14 (×4): qty 2

## 2016-09-14 MED ORDER — EZETIMIBE 10 MG PO TABS
10.0000 mg | ORAL_TABLET | Freq: Every day | ORAL | Status: DC
  Administered 2016-09-14 – 2016-09-16 (×3): 10 mg via ORAL
  Filled 2016-09-14 (×3): qty 1

## 2016-09-14 MED ORDER — ASPIRIN 81 MG PO CHEW
324.0000 mg | CHEWABLE_TABLET | ORAL | Status: AC
  Administered 2016-09-14: 324 mg via ORAL
  Filled 2016-09-14: qty 4

## 2016-09-14 MED ORDER — NITROGLYCERIN 0.4 MG SL SUBL
0.4000 mg | SUBLINGUAL_TABLET | SUBLINGUAL | Status: AC
  Administered 2016-09-14: 0.4 mg via SUBLINGUAL

## 2016-09-14 MED ORDER — CALCIUM CARBONATE ANTACID 500 MG PO CHEW
1.0000 | CHEWABLE_TABLET | Freq: Three times a day (TID) | ORAL | Status: DC | PRN
  Filled 2016-09-14: qty 1

## 2016-09-14 MED ORDER — ACETAMINOPHEN 325 MG PO TABS
650.0000 mg | ORAL_TABLET | ORAL | Status: DC | PRN
  Administered 2016-09-15: 650 mg via ORAL
  Filled 2016-09-14: qty 2

## 2016-09-14 MED ORDER — FLUTICASONE FUROATE-VILANTEROL 200-25 MCG/INH IN AEPB
1.0000 | INHALATION_SPRAY | Freq: Every day | RESPIRATORY_TRACT | Status: DC
  Administered 2016-09-14 – 2016-09-16 (×3): 1 via RESPIRATORY_TRACT
  Filled 2016-09-14: qty 28

## 2016-09-14 MED ORDER — INSULIN REGULAR HUMAN 100 UNIT/ML IJ SOLN: 12.0000 [IU] | Freq: Once | INTRAMUSCULAR | Status: DC

## 2016-09-14 MED ORDER — FAMOTIDINE 20 MG PO TABS: 20.0000 mg | ORAL_TABLET | Freq: Two times a day (BID) | ORAL | Status: DC

## 2016-09-14 MED ORDER — ALPRAZOLAM 0.5 MG PO TABS: 0.5000 mg | ORAL_TABLET | Freq: Two times a day (BID) | ORAL | Status: DC | PRN

## 2016-09-14 MED ORDER — CALCIUM CARBONATE ANTACID 500 MG PO CHEW
2.0000 | CHEWABLE_TABLET | Freq: Three times a day (TID) | ORAL | Status: DC | PRN
  Administered 2016-09-14: 200 mg via ORAL
  Administered 2016-09-15: 400 mg via ORAL
  Filled 2016-09-14: qty 2

## 2016-09-14 MED ORDER — NITROGLYCERIN 0.4 MG SL SUBL
SUBLINGUAL_TABLET | SUBLINGUAL | Status: AC
  Administered 2016-09-14: 0.4 mg via SUBLINGUAL
  Filled 2016-09-14: qty 1

## 2016-09-14 MED ORDER — IPRATROPIUM-ALBUTEROL 0.5-2.5 (3) MG/3ML IN SOLN
3.0000 mL | Freq: Once | RESPIRATORY_TRACT | Status: AC
  Administered 2016-09-14: 3 mL via RESPIRATORY_TRACT
  Filled 2016-09-14: qty 3

## 2016-09-14 MED ORDER — LOSARTAN POTASSIUM 50 MG PO TABS
50.0000 mg | ORAL_TABLET | Freq: Every day | ORAL | Status: DC
  Administered 2016-09-14: 50 mg via ORAL
  Filled 2016-09-14: qty 1

## 2016-09-14 MED ORDER — MAGNESIUM OXIDE 400 (241.3 MG) MG PO TABS
800.0000 mg | ORAL_TABLET | Freq: Once | ORAL | Status: AC
  Administered 2016-09-14: 800 mg via ORAL
  Filled 2016-09-14: qty 2

## 2016-09-14 MED ORDER — ASPIRIN 300 MG RE SUPP: 300.0000 mg | RECTAL | Status: AC

## 2016-09-14 MED ORDER — ONDANSETRON HCL 4 MG/2ML IJ SOLN: 4.0000 mg | Freq: Four times a day (QID) | INTRAMUSCULAR | Status: DC | PRN

## 2016-09-14 MED ORDER — HYDRALAZINE HCL 20 MG/ML IJ SOLN
INTRAMUSCULAR | Status: AC
  Filled 2016-09-14: qty 1

## 2016-09-14 MED ORDER — NITROGLYCERIN 0.4 MG SL SUBL
0.4000 mg | SUBLINGUAL_TABLET | Freq: Once | SUBLINGUAL | Status: AC
  Administered 2016-09-14: 0.4 mg via SUBLINGUAL

## 2016-09-14 MED ORDER — METHYLPREDNISOLONE SODIUM SUCC 125 MG IJ SOLR
125.0000 mg | INTRAMUSCULAR | Status: AC
  Administered 2016-09-14: 125 mg via INTRAVENOUS
  Filled 2016-09-14: qty 2

## 2016-09-14 MED ORDER — CALCIUM ACETATE (PHOS BINDER) 667 MG PO CAPS
1334.0000 mg | ORAL_CAPSULE | Freq: Three times a day (TID) | ORAL | Status: DC
  Administered 2016-09-14 – 2016-09-16 (×5): 1334 mg via ORAL
  Filled 2016-09-14 (×5): qty 2

## 2016-09-14 MED ORDER — HYDRALAZINE HCL 20 MG/ML IJ SOLN
10.0000 mg | INTRAMUSCULAR | Status: DC | PRN
  Administered 2016-09-14 – 2016-09-15 (×3): 10 mg via INTRAVENOUS
  Filled 2016-09-14 (×2): qty 1

## 2016-09-14 MED ORDER — SODIUM CHLORIDE 0.9 % IV SOLN: 250.0000 mL | INTRAVENOUS | Status: DC | PRN

## 2016-09-14 MED ORDER — NITROGLYCERIN PEDIATRIC IV INFUSION 200 MCG/ML: 0.5000 ug/kg/min | INTRAVENOUS | Status: DC

## 2016-09-14 MED ORDER — NITROGLYCERIN IN D5W 200-5 MCG/ML-% IV SOLN
0.0000 ug/min | INTRAVENOUS | Status: DC
  Administered 2016-09-14: 5 ug/min via INTRAVENOUS
  Filled 2016-09-14: qty 250

## 2016-09-14 MED ORDER — FAMOTIDINE 20 MG PO TABS
20.0000 mg | ORAL_TABLET | Freq: Every day | ORAL | Status: DC
  Administered 2016-09-14 – 2016-09-15 (×2): 20 mg via ORAL
  Filled 2016-09-14 (×2): qty 1

## 2016-09-14 MED ORDER — METHYLPREDNISOLONE SODIUM SUCC 125 MG IJ SOLR
INTRAMUSCULAR | Status: AC
  Filled 2016-09-14: qty 2

## 2016-09-14 MED ORDER — HEPARIN SODIUM (PORCINE) 5000 UNIT/ML IJ SOLN
5000.0000 [IU] | Freq: Three times a day (TID) | INTRAMUSCULAR | Status: DC
  Administered 2016-09-14: 5000 [IU] via SUBCUTANEOUS
  Filled 2016-09-14 (×4): qty 1

## 2016-09-14 MED ORDER — INSULIN ASPART 100 UNIT/ML ~~LOC~~ SOLN
0.0000 [IU] | SUBCUTANEOUS | Status: DC
  Administered 2016-09-15: 2 [IU] via SUBCUTANEOUS
  Administered 2016-09-15: 1 [IU] via SUBCUTANEOUS
  Administered 2016-09-15 – 2016-09-16 (×2): 2 [IU] via SUBCUTANEOUS
  Administered 2016-09-16: 1 [IU] via SUBCUTANEOUS
  Filled 2016-09-14: qty 2
  Filled 2016-09-14 (×2): qty 1
  Filled 2016-09-14 (×2): qty 2

## 2016-09-14 MED ORDER — FAMOTIDINE IN NACL 20-0.9 MG/50ML-% IV SOLN: 20.0000 mg | Freq: Two times a day (BID) | INTRAVENOUS | Status: DC

## 2016-09-14 MED ORDER — INSULIN ASPART 100 UNIT/ML ~~LOC~~ SOLN
12.0000 [IU] | Freq: Once | SUBCUTANEOUS | Status: AC
  Administered 2016-09-14: 12 [IU] via SUBCUTANEOUS
  Filled 2016-09-14: qty 12

## 2016-09-14 MED ORDER — FAMOTIDINE 20 MG PO TABS: 20.0000 mg | ORAL_TABLET | Freq: Every day | ORAL | Status: DC

## 2016-09-14 MED ORDER — SERTRALINE HCL 50 MG PO TABS
50.0000 mg | ORAL_TABLET | Freq: Every day | ORAL | Status: DC
  Administered 2016-09-14 – 2016-09-16 (×3): 50 mg via ORAL
  Filled 2016-09-14 (×3): qty 1

## 2016-09-14 NOTE — Progress Notes (Signed)
Subjective:  Urgent consult by ER Patient presented for acute shortness of breath Wednesday she is on noninvasive positive pressure ventilation, not able to provide much information.  Her husband is at bedside.  He states that she has started smoking again. Admission ABG shows pH of 7.10, PCO2 of 120 Some improvement with BiPAP to 7.26/96  Objective:  Vital signs in last 24 hours:  Temp:  [97.8 F (36.6 C)] 97.8 F (36.6 C) (10/20 0810) Pulse Rate:  [66-102] 66 (10/20 0945) Resp:  [12-34] 19 (10/20 0945) BP: (125-259)/(70-120) 125/71 (10/20 0945) SpO2:  [94 %-100 %] 100 % (10/20 0945) FiO2 (%):  [50 %] 50 % (10/20 0825) Weight:  [67.9 kg (149 lb 11.2 oz)] 67.9 kg (149 lb 11.2 oz) (10/20 0716)  Weight change:  Filed Weights   09/14/16 0716  Weight: 67.9 kg (149 lb 11.2 oz)    Intake/Output:   No intake or output data in the 24 hours ending 09/14/16 1044   Physical Exam: General: Critically ill-appearing  HEENT NIPPV mask in place  Neck distended neck veins  Pulm/lungs Diffuse crackles bilaterally  CVS/Heart tachycardic  Abdomen:  Soft, nontender  Extremities: 2+ pitting edema  Neurologic: Alert, oriented  Skin: No acute rashes  Access: AV fistula       Basic Metabolic Panel:   Recent Labs Lab 09/14/16 0707  NA 141  K 4.6  CL 94*  CO2 35*  GLUCOSE 287*  BUN 47*  CREATININE 7.56*  CALCIUM 8.3*     CBC:  Recent Labs Lab 09/14/16 0707  WBC 11.0  HGB 11.9*  HCT 36.9  MCV 98.2  PLT 354      Microbiology:  No results found for this or any previous visit (from the past 720 hour(s)).  Coagulation Studies: No results for input(s): LABPROT, INR in the last 72 hours.  Urinalysis: No results for input(s): COLORURINE, LABSPEC, PHURINE, GLUCOSEU, HGBUR, BILIRUBINUR, KETONESUR, PROTEINUR, UROBILINOGEN, NITRITE, LEUKOCYTESUR in the last 72 hours.  Invalid input(s): APPERANCEUR    Imaging: Dg Chest Portable 1 View  Result Date:  09/14/2016 CLINICAL DATA:  Difficulty breathing, dyspnea, COPD, hypertension, chronic kidney disease analysis, GERD EXAM: PORTABLE CHEST 1 VIEW COMPARISON:  Portable exam 0719 hours compared to 07/24/2016 FINDINGS: Enlargement of cardiac silhouette with pulmonary vascular congestion. Calcified tortuous thoracic aorta. Diffuse interstitial infiltrates likely pulmonary edema RIGHT greater than LEFT, increased since previous exam. No gross pleural effusion or pneumothorax. IMPRESSION: Increased pulmonary edema. Electronically Signed   By: [name] M.D.   On: 09/14/2016 07:56     Medications:   . nitroGLYCERIN 5 mcg/min (09/14/16 [id number])   . amLODipine  10 mg Oral Daily  . aspirin  324 mg Oral NOW   Or  . aspirin  300 mg Rectal NOW  . calcium acetate  1,334 mg Oral TID WC  . carvedilol  12.5 mg Oral BID WC  . ezetimibe  10 mg Oral Daily  . fluticasone furoate-vilanterol  1 puff Inhalation Daily  . heparin  5,000 Units Subcutaneous Q8H  . insulin aspart  0-9 Units Subcutaneous Q4H  . losartan  50 mg Oral Daily  . sertraline  50 mg Oral Daily   sodium chloride, acetaminophen, albuterol, ALPRAZolam, ondansetron (ZOFRAN) IV  Assessment/ Plan:  60 y.o.African American female with HTN, hyperlipidemia, diastolic heart failure, hypertension, COPD, tobacco abuse, AOCD, SHPTH, LUE AVF, ESRD 02/2013, admission for severe anemia/black stools 05/28/16, nonbleeding gastric ulcers on EGD 8/181/17, presents for acute shortness of breath and respiratory failure  [facility]/MWF  1. End-stage renal disease on hemodialysis MWF.  -urgent dialysis today - outpatient EDW 61 kg. Current 68 kg  - goal UF 3-4  L  as tolerated  Today - May need extra HD tomorrow  2.  Acute respiratory failure- hypercarbic and due to acute Pulm edema - urgent HD . UF goal as tolerated  3. Secondary hyperparathyroidism.  Monitor Phos  4. AOCKD - Hgb 11.9 - EPO with treatment once Hgb < 11   LOS:  0 Bryttney Netzer 10/20/201710:44 AM

## 2016-09-14 NOTE — ED Provider Notes (Signed)
[facility] Emergency Department Provider Note   ____________________________________________   None    (approximate)  I have reviewed the triage vital signs and the nursing notes.   HISTORY  Chief Complaint Respiratory Distress  EM caveat: The patient is extremely short of breath  HPI Michele Nash is a 60 y.o. female here for evaluation for shortness of breath.  Patient was noted to be sitting up on the couch and very hard labored breathing.  Patient was placed on CPAP with EMS.   Past Medical History:  Diagnosis Date  . Anemia   . Apnea, sleep    for sleep study 10/17/15-no cpap yet  . Arthritis   . Bronchitis   . Chronic kidney disease   . COPD (chronic obstructive pulmonary disease) ([place])   . Depression   . Dialysis patient ([place]) 2014  . GERD (gastroesophageal reflux disease)   . Headache   . Hyperlipidemia   . Hypertension   . Obesity   . Renal dialysis device, implant, or graft complication    RIGHT CHEST CATH    Patient Active Problem List   Diagnosis Date Noted  . Pulmonary edema 09/14/2016  . Respiratory failure ([place]) 07/23/2016  . GIB (gastrointestinal bleeding) 07/09/2016  . Protein-calorie malnutrition, severe 05/29/2016  . Hyperglycemia 05/28/2016  . Pain in limb 10/28/2015  . End stage renal disease ([place]) 10/28/2015  . Essential hypertension 10/28/2015  . ESRD on hemodialysis ([place]) 03/29/2015  . HTN (hypertension) 03/29/2015  . COPD (chronic obstructive pulmonary disease) ([place]) 03/29/2015  . GAD (generalized anxiety disorder) 03/29/2015    Past Surgical History:  Procedure Laterality Date  . ABDOMINAL HYSTERECTOMY    . COLONOSCOPY  2011  . COLONOSCOPY WITH PROPOFOL N/A 09/20/2015   Procedure: COLONOSCOPY WITH PROPOFOL;  Surgeon: Lucilla Lame, MD;  Location: [facility] ENDOSCOPY;  Service: Endoscopy;  Laterality: N/A;  . DIALYSIS FISTULA CREATION    . ESOPHAGOGASTRODUODENOSCOPY N/A 07/13/2016   Procedure:  ESOPHAGOGASTRODUODENOSCOPY (EGD);  Surgeon: Lollie Sails, MD;  Location: [facility] ENDOSCOPY;  Service: Endoscopy;  Laterality: N/A;  . ESOPHAGOGASTRODUODENOSCOPY (EGD) WITH PROPOFOL N/A 06/02/2016   Procedure: ESOPHAGOGASTRODUODENOSCOPY (EGD) WITH PROPOFOL;  Surgeon: Manya Silvas, MD;  Location: [facility] ENDOSCOPY;  Service: Endoscopy;  Laterality: N/A;  . PERIPHERAL VASCULAR CATHETERIZATION N/A 05/10/2015   Procedure: A/V Shuntogram/Fistulagram;  Surgeon: Katha Cabal, MD;  Location: [place] CV LAB;  Service: Cardiovascular;  Laterality: N/A;  . PERIPHERAL VASCULAR CATHETERIZATION Left 05/10/2015   Procedure: A/V Shunt Intervention;  Surgeon: Katha Cabal, MD;  Location: [place] CV LAB;  Service: Cardiovascular;  Laterality: Left;  . PERIPHERAL VASCULAR CATHETERIZATION Left 08/23/2015   Procedure: A/V Shuntogram/Fistulagram;  Surgeon: Katha Cabal, MD;  Location: [place] CV LAB;  Service: Cardiovascular;  Laterality: Left;  . PERIPHERAL VASCULAR CATHETERIZATION N/A 08/23/2015   Procedure: A/V Shunt Intervention;  Surgeon: Katha Cabal, MD;  Location: [place] CV LAB;  Service: Cardiovascular;  Laterality: N/A;  . PERIPHERAL VASCULAR CATHETERIZATION  08/23/2015   Procedure: Dialysis/Perma Catheter Insertion;  Surgeon: Katha Cabal, MD;  Location: [place] CV LAB;  Service: Cardiovascular;;  . PERIPHERAL VASCULAR CATHETERIZATION N/A 01/03/2016   Procedure: Dialysis/Perma Catheter Removal;  Surgeon: Katha Cabal, MD;  Location: [place] CV LAB;  Service: Cardiovascular;  Laterality: N/A;  . REVISON OF ARTERIOVENOUS FISTULA Left 10/28/2015   Procedure: REVISON OF ARTERIOVENOUS FISTULA WITH ARTEGRAFT;  Surgeon: Katha Cabal, MD;  Location: [facility] ORS;  Service: Vascular;  Laterality: Left;  .  WOUND DEBRIDEMENT Left 10/28/2015   Procedure: Resection of shoulder cyst ( left );  Surgeon: Katha Cabal, MD;  Location: [facility] ORS;  Service: Vascular;   Laterality: Left;    Prior to Admission medications   Medication Sig Start Date End Date Taking? Authorizing Provider  albuterol (PROVENTIL HFA;VENTOLIN HFA) 108 (90 BASE) MCG/ACT inhaler Inhale 2 puffs into the lungs every 6 (six) hours as needed for wheezing or shortness of breath.   Yes Historical Provider, MD  ALPRAZolam Duanne Moron) 0.5 MG tablet Take 0.5 mg by mouth 2 (two) times daily.   Yes Historical Provider, MD  amLODipine (NORVASC) 10 MG tablet Take 10 mg by mouth daily.    Yes Historical Provider, MD  calcium acetate (PHOSLO) 667 MG capsule Take 1,334 mg by mouth 3 (three) times daily.    Yes Historical Provider, MD  calcium carbonate (OS-CAL) 600 MG TABS tablet Take 600 mg by mouth 2 (two) times daily with a meal.   Yes Historical Provider, MD  carvedilol (COREG) 25 MG tablet Take 1 tablet (25 mg total) by mouth 2 (two) times daily with a meal. 04/26/16  Yes Sital Mody, MD  cinacalcet (SENSIPAR) 30 MG tablet Take 60 mg by mouth daily with breakfast.    Yes Historical Provider, MD  ezetimibe (ZETIA) 10 MG tablet Take 10 mg by mouth daily.    Yes Historical Provider, MD  fluticasone (FLONASE) 50 MCG/ACT nasal spray Place 2 sprays into both nostrils daily as needed for rhinitis.   Yes Historical Provider, MD  Fluticasone-Salmeterol (ADVAIR) 100-50 MCG/DOSE AEPB Inhale 1 puff into the lungs 2 (two) times daily.   Yes Historical Provider, MD  furosemide (LASIX) 40 MG tablet Take 40 mg by mouth every Tuesday, Thursday, Saturday, and Sunday.    Yes Historical Provider, MD  lidocaine-prilocaine (EMLA) cream Apply 1 application topically as needed (prior to accessing port).   Yes Historical Provider, MD  losartan (COZAAR) 100 MG tablet Take 100 mg by mouth daily.    Yes Historical Provider, MD  multivitamin (RENA-VIT) TABS tablet Take 1 tablet by mouth at bedtime.    Yes Historical Provider, MD  omeprazole (PRILOSEC) 20 MG capsule Take 20 mg by mouth daily.   Yes Historical Provider, MD    sertraline (ZOLOFT) 50 MG tablet Take 50 mg by mouth daily.    Yes Historical Provider, MD  Fluticasone-Salmeterol (ADVAIR) 250-50 MCG/DOSE AEPB Inhale 1 puff into the lungs 2 (two) times daily.    Historical Provider, MD  pantoprazole (PROTONIX) 40 MG tablet Take 1 tablet (40 mg total) by mouth 2 (two) times daily. Patient not taking: Reported on 09/14/2016 06/02/16   Demetrios Loll, MD    Allergies Sulfa antibiotics  Family History  Problem Relation Age of Onset  . Heart disease Mother   . Cancer Father   . Cancer Sister     Social History Social History  Substance Use Topics  . Smoking status: Former Smoker    Packs/day: 0.50    Years: 40.00    Types: Cigarettes    Quit date: 11/23/2014  . Smokeless tobacco: Never Used  . Alcohol use No    Review of Systems Patient reports she feels short of breath. Denies chest pain  10-point ROS otherwise negative.  ____________________________________________   PHYSICAL EXAM:  VITAL SIGNS: ED Triage Vitals  Enc Vitals Group     BP 09/14/16 0715 (!) 167/120     Pulse Rate 09/14/16 0715 100     Resp 09/14/16 0715 (!)  24     Temp --      Temp src --      SpO2 09/14/16 0715 100 %     Weight 09/14/16 0716 149 lb 11.2 oz (67.9 kg)     Height 09/14/16 0716 5\' 2"  (1.575 m)     Head Circumference --      Peak Flow --      Pain Score 09/14/16 0717 0     Pain Loc --      Pain Edu? --      Excl. in [place]? --     Constitutional: Somnolent, alert to voice and then becomes well oriented. On CPAP. Seems to have slight facial edema, also known to have severe JVD. Opens eyes, follows commands. Tolerating BiPAP mask well Eyes: Conjunctivae are normal. PERRL. EOMI. Head: Atraumatic. Nose: No congestion/rhinnorhea. Mouth/Throat: Mucous membranes are moist.  Oropharynx non-erythematous. No oral pharyngeal edema. The patient's face looks edematous though, and notable JVD Neck: No stridor.   Cardiovascular: Tachycardic rate, regular rhythm.  Grossly normal heart sounds.  Good peripheral circulation. Respiratory: Low lung volumes. Rales throughout with occasional wheezes. Primarily Rales and crackles throughout all lung lobes Gastrointestinal: Soft and nontender. No distention.  Musculoskeletal: Modest equal bilateral lower extremity edema  No joint effusions. Neurologic:  Normal speech and language. Moves all extremities well. She is oriented to self and place and location. She is able to state her name clearly. Skin:  Skin is warm, dry and intact. No rash noted. Psychiatric: Mood and affect are normal. Speech and behavior are normal.  ____________________________________________   LABS (all labs ordered are listed, but only abnormal results are displayed)  Labs Reviewed  CBC - Abnormal; Notable for the following:       Result Value   RBC 3.75 (*)    Hemoglobin 11.9 (*)    RDW 17.7 (*)    All other components within normal limits  COMPREHENSIVE METABOLIC PANEL - Abnormal; Notable for the following:    Chloride 94 (*)    CO2 35 (*)    Glucose, Bld 287 (*)    BUN 47 (*)    Creatinine, Ser 7.56 (*)    Calcium 8.3 (*)    AST 43 (*)    Alkaline Phosphatase 233 (*)    GFR calc non Af Amer 5 (*)    GFR calc Af Amer 6 (*)    All other components within normal limits  BRAIN NATRIURETIC PEPTIDE - Abnormal; Notable for the following:    B Natriuretic Peptide 457.0 (*)    All other components within normal limits  TROPONIN I - Abnormal; Notable for the following:    Troponin I 0.05 (*)    All other components within normal limits  BLOOD GAS, VENOUS - Abnormal; Notable for the following:    pH, Ven 7.10 (*)    pCO2, Ven >120 (*)    All other components within normal limits  BLOOD GAS, ARTERIAL - Abnormal; Notable for the following:    pH, Arterial 7.26 (*)    pCO2 arterial 96 (*)    pO2, Arterial 78 (*)    Bicarbonate 43.1 (*)    Acid-Base Excess 12.6 (*)    All other components within normal limits  BRAIN NATRIURETIC  PEPTIDE - Abnormal; Notable for the following:    B Natriuretic Peptide 329.0 (*)    All other components within normal limits  TROPONIN I - Abnormal; Notable for the following:    Troponin I 0.05 (*)  All other components within normal limits  GLUCOSE, CAPILLARY - Abnormal; Notable for the following:    Glucose-Capillary 162 (*)    All other components within normal limits  RENAL FUNCTION PANEL - Abnormal; Notable for the following:    Chloride 96 (*)    CO2 35 (*)    Glucose, Bld 162 (*)    BUN 51 (*)    Creatinine, Ser 7.74 (*)    Calcium 8.3 (*)    GFR calc non Af Amer 5 (*)    GFR calc Af Amer 6 (*)    All other components within normal limits  GLUCOSE, CAPILLARY - Abnormal; Notable for the following:    Glucose-Capillary 107 (*)    All other components within normal limits  CK  COMPREHENSIVE METABOLIC PANEL  MAGNESIUM  TROPONIN I  TROPONIN I  CBC WITH DIFFERENTIAL/PLATELET  HEPATITIS B SURFACE ANTIGEN  HEPATITIS B SURFACE ANTIBODY, QUANTITATIVE  RENAL FUNCTION PANEL   ____________________________________________  EKG  Reviewed and interpreted by me at 7:30 AM Heart rate 100 QRS 90 QTc 480 Normal sinus rhythm, probable old anterior infarct No evidence of acute ischemic change. Sinus tachycardia ____________________________________________  RADIOLOGY  Dg Abd 1 View  Result Date: 09/14/2016 CLINICAL DATA:  New onset abdominal pain EXAM: ABDOMEN - 1 VIEW COMPARISON:  CT scan 05/24/2016. FINDINGS: 1039 hours. Supine portable view the abdomen shows no gaseous bowel dilatation to suggest obstruction. Multiple surgical clips are noted over the anatomic pelvis. No unexpected abdominal pelvic calcification. Visualized bony anatomy is unremarkable. IMPRESSION: Negative. Electronically Signed   By: Misty Stanley M.D.   On: 09/14/2016 10:57   Dg Chest Portable 1 View  Result Date: 09/14/2016 CLINICAL DATA:  Difficulty breathing, dyspnea, COPD, hypertension, chronic  kidney disease analysis, GERD EXAM: PORTABLE CHEST 1 VIEW COMPARISON:  Portable exam 0719 hours compared to 07/24/2016 FINDINGS: Enlargement of cardiac silhouette with pulmonary vascular congestion. Calcified tortuous thoracic aorta. Diffuse interstitial infiltrates likely pulmonary edema RIGHT greater than LEFT, increased since previous exam. No gross pleural effusion or pneumothorax. IMPRESSION: Increased pulmonary edema. Electronically Signed   By: [name] M.D.   On: 09/14/2016 07:56    ____________________________________________   PROCEDURES  Procedure(s) performed: None  Procedures  Critical Care performed: Yes, see critical care note(s)  CRITICAL CARE Performed by: Delman Kitten   Total critical care time: 65 minutes  Critical care time was exclusive of separately billable procedures and treating other patients.  Critical care was necessary to treat or prevent imminent or life-threatening deterioration.  Critical care was time spent personally by me on the following activities: development of treatment plan with patient and/or surrogate as well as nursing, discussions with consultants, evaluation of patient's response to treatment, examination of patient, obtaining history from patient or surrogate, ordering and performing treatments and interventions, ordering and review of laboratory studies, ordering and review of radiographic studies, pulse oximetry and re-evaluation of patient's condition.  ____________________________________________   INITIAL IMPRESSION / ASSESSMENT AND PLAN / ED COURSE  Pertinent labs & imaging results that were available during my care of the patient were reviewed by me and considered in my medical decision making (see chart for details).  ----------------------------------------- 8:04 AM on 09/14/2016 -----------------------------------------  The patient seen by Dr. Felicie Morn of the ICU in the ER at [place] with continue current  therapy, BiPAP, and obtain ABG for review.   Patient taking  volumes about 10-12 L/m on BiPAP. She is slightly somnolent, but alerts sits up and listens well.  She follows all commands as well oriented. Continuing her on BiPAP at present. I have called and discussed with her husband who is in route and affirms the patient is full code.  Case was discussed with Dr. Candiss Norse about 30 minutes ago, he is arranging for dialysis,  Currently titrating nitroglycerin drip. Patient showing improvement with improvement in blood pressure. Respirations appear to be improving as well.  Clinical Course  Comment By Time  Patient alert, tolerating BiPAP well. In no distress. Patient in route to the ICU shortly, dialysis ready. Patient overall status improved Delman Kitten, MD 10/20 [phone]   ----------------------------------------- 8:18 AM on 09/14/2016 -----------------------------------------  Patient continuing to tolerate BiPAP well. Blood pressures improving. Patient appears to be showing signs of improvement. Resting comfortably, awaiting ICU bed and hemodialysis both of which have been requested on an emergent basis. Potassium normal.  ____________________________________________   FINAL CLINICAL IMPRESSION(S) / ED DIAGNOSES  Final diagnoses:  Acute pulmonary edema ([place])      NEW MEDICATIONS STARTED DURING THIS VISIT:  Current Discharge Medication List       Note:  This document was prepared using Dragon voice recognition software and may include unintentional dictation errors.     Delman Kitten, MD 09/14/16 [phone]

## 2016-09-14 NOTE — Progress Notes (Signed)
Michele Nash notified of lab glucose of 514

## 2016-09-14 NOTE — ED Triage Notes (Addendum)
Pt arrived via EMS from home with c/o respiratory difficulty. Pt is a HD pt and did not miss any treatments.  Pt goes to HD on MWF.  Per EMS pt did administer neb prior to calling EMS and was on her [place] when EMS arrived. EMS placed pt on CPAP, CO2 was 44.  Pt denied any chest pain with EMS and was sitting up on a sofa when FD arrived. Pt has swelling to face, but no tongue swelling.  Pt states she has noticed the swelling to her face over the past few days. Pt is able to communicate to staff.  Pt did tell EMS she took her morning medications already.

## 2016-09-14 NOTE — Progress Notes (Signed)
Pre Dialysis 

## 2016-09-14 NOTE — Progress Notes (Signed)
Increased IPAP to 16 due to tidal volumes in the mid 300's, new volumes on ipap of 18 are upper 300's to low to mid 400's. Dr. Jacqualine Code notified of change.

## 2016-09-14 NOTE — ED Notes (Signed)
Informed Dr. Jacqualine Code and Dr. Candiss Norse that patient does not have admit orders yet. Paged intensivist.

## 2016-09-14 NOTE — ED Notes (Signed)
Family at bedside. Updated family on status.

## 2016-09-14 NOTE — ED Notes (Signed)
Dr. Jacqualine Code notified of patient's troponin. No new orders at this time.

## 2016-09-14 NOTE — Progress Notes (Signed)
Sats on 50% are 100%, decreased to 40% FIO2.

## 2016-09-14 NOTE — Progress Notes (Signed)
Dialysis complete

## 2016-09-14 NOTE — Progress Notes (Signed)
Post dialysis 

## 2016-09-14 NOTE — H&P (Signed)
PULMONARY / CRITICAL CARE MEDICINE   Name: Michele Nash MRN: [id number] DOB: February 27, 1956    ADMISSION DATE:  09/14/2016 CONSULTATION DATE:  09/14/2016  REFERRING MD:  Dr. Jacqualine Code  CHIEF COMPLAINT:  Respiratory Distress  HISTORY OF PRESENT ILLNESS:   This is a 60 yo female with a PMH of ESRD (Hemodialysis), Obesity, Hypertension, Hyperlipidemia, GERD, Depression, COPD, Bronchitic, Arthritis, OSA (sleep study 10/17/15-no cpap yet), and Anemia.  She presented to [facility] ER on 10/20 with c/o shortness of breath she denies missing any dialysis sessions.  Do to lethargy HPI difficult to obtain. PCCM notified 10/20 to admit pt to the ICU due to acute on chronic hypoxic hypercapnic respiratory failure secondary to AECOPD, pulmonary edema and hypertension.  PAST MEDICAL HISTORY :  She  has a past medical history of Anemia; Apnea, sleep; Arthritis; Bronchitis; Chronic kidney disease; COPD (chronic obstructive pulmonary disease) ([place]); Depression; Dialysis patient [facility]) (2014); GERD (gastroesophageal reflux disease); Headache; Hyperlipidemia; Hypertension; Obesity; and Renal dialysis device, implant, or graft complication.  PAST SURGICAL HISTORY: She  has a past surgical history that includes Abdominal hysterectomy; Dialysis fistula creation; Colonoscopy (2011); Cardiac catheterization (N/A, 05/10/2015); Cardiac catheterization (Left, 05/10/2015); Cardiac catheterization (Left, 08/23/2015); Cardiac catheterization (N/A, 08/23/2015); Cardiac catheterization (08/23/2015); Colonoscopy with propofol (N/A, 09/20/2015); Revison of arteriovenous fistula (Left, 10/28/2015); Wound debridement (Left, 10/28/2015); Cardiac catheterization (N/A, 01/03/2016); Esophagogastroduodenoscopy (egd) with propofol (N/A, 06/02/2016); and Esophagogastroduodenoscopy (N/A, 07/13/2016).  Allergies  Allergen Reactions  . Sulfa Antibiotics Itching, Swelling, Rash and Other (See Comments)    Reaction:  Facial/body swelling     No current  facility-administered medications on file prior to encounter.    Current Outpatient Prescriptions on File Prior to Encounter  Medication Sig  . albuterol (PROVENTIL HFA;VENTOLIN HFA) 108 (90 BASE) MCG/ACT inhaler Inhale 2 puffs into the lungs every 6 (six) hours as needed for wheezing or shortness of breath.  . ALPRAZolam (XANAX) 0.5 MG tablet Take 0.5 mg by mouth 2 (two) times daily.  Marland Kitchen amLODipine (NORVASC) 10 MG tablet Take 10 mg by mouth at bedtime.  . calcium acetate (PHOSLO) 667 MG capsule Take 667-1,334 mg by mouth See admin instructions. Pt takes two capsules three times daily with meals and one capsule two times daily with snacks.  . calcium carbonate (OS-CAL) 600 MG TABS tablet Take 600 mg by mouth 2 (two) times daily with a meal.  . carvedilol (COREG) 25 MG tablet Take 1 tablet (25 mg total) by mouth 2 (two) times daily with a meal. (Patient taking differently: Take 12.5 mg by mouth 2 (two) times daily with a meal. )  . cinacalcet (SENSIPAR) 30 MG tablet Take 60 mg by mouth daily with breakfast.   . ezetimibe (ZETIA) 10 MG tablet Take 10 mg by mouth daily.   . fluticasone (FLONASE) 50 MCG/ACT nasal spray Place 2 sprays into both nostrils daily as needed for rhinitis.  . Fluticasone-Salmeterol (ADVAIR) 250-50 MCG/DOSE AEPB Inhale 1 puff into the lungs 2 (two) times daily.  . furosemide (LASIX) 40 MG tablet Take 40 mg by mouth every Tuesday, Thursday, Saturday, and Sunday.   . lidocaine-prilocaine (EMLA) cream Apply 1 application topically as needed (prior to accessing port).  . losartan (COZAAR) 100 MG tablet Take 100 mg by mouth daily.   . multivitamin (RENA-VIT) TABS tablet Take 1 tablet by mouth at bedtime.   . pantoprazole (PROTONIX) 40 MG tablet Take 1 tablet (40 mg total) by mouth 2 (two) times daily.  . sertraline (ZOLOFT) 50 MG tablet Take 50  mg by mouth daily.     FAMILY HISTORY:  Her indicated that her mother is deceased. She indicated that her father is deceased. She  indicated that her sister is deceased.    SOCIAL HISTORY: She  reports that she quit smoking about 21 months ago. Her smoking use included Cigarettes. She has a 20.00 pack-year smoking history. She has never used smokeless tobacco. She reports that she does not drink alcohol or use drugs.  REVIEW OF SYSTEMS:   Unable to assess pt on bipap  SUBJECTIVE:  Unable to assess pt on bipap  VITAL SIGNS: BP 138/77   Pulse 73   Temp 97.8 F (36.6 C) (Axillary)   Resp 13   Ht 5\' 2"  (1.575 m)   Wt 149 lb 11.2 oz (67.9 kg)   SpO2 100%   BMI 27.38 kg/m   HEMODYNAMICS:    VENTILATOR SETTINGS: FiO2 (%):  [50 %] 50 %  INTAKE / OUTPUT: No intake/output data recorded.  PHYSICAL EXAMINATION: General:  Acutely ill AA female Neuro:  Lethargic, follows commands, PERRLA HEENT:  Supple, no JVD Cardiovascular:  S1s2, rrr, no M/R/G Lungs:  Diminished throughout on bipap, even, non labored Abdomen:  Very faint BS x4, tender throughout with palpation, soft, non distended Musculoskeletal:  Normal bulk and tone Skin:  Left upper arm fistula, intact no rashes or lesions  LABS:  BMET  Recent Labs Lab 09/14/16 0707  NA 141  K 4.6  CL 94*  CO2 35*  BUN 47*  CREATININE 7.56*  GLUCOSE 287*    Electrolytes  Recent Labs Lab 09/14/16 0707  CALCIUM 8.3*    CBC  Recent Labs Lab 09/14/16 0707  WBC 11.0  HGB 11.9*  HCT 36.9  PLT 354    Coag's No results for input(s): APTT, INR in the last 168 hours.  Sepsis Markers No results for input(s): LATICACIDVEN, PROCALCITON, O2SATVEN in the last 168 hours.  ABG  Recent Labs Lab 09/14/16 0747  PHART 7.26*  PCO2ART 96*  PO2ART 78*    Liver Enzymes  Recent Labs Lab 09/14/16 0707  AST 43*  ALT 33  ALKPHOS 233*  BILITOT 0.8  ALBUMIN 3.8    Cardiac Enzymes  Recent Labs Lab 09/14/16 0707  TROPONINI 0.05*    Glucose No results for input(s): GLUCAP in the last 168 hours.  Imaging Dg Chest Portable 1  View  Result Date: 09/14/2016 CLINICAL DATA:  Difficulty breathing, dyspnea, COPD, hypertension, chronic kidney disease analysis, GERD EXAM: PORTABLE CHEST 1 VIEW COMPARISON:  Portable exam 0719 hours compared to 07/24/2016 FINDINGS: Enlargement of cardiac silhouette with pulmonary vascular congestion. Calcified tortuous thoracic aorta. Diffuse interstitial infiltrates likely pulmonary edema RIGHT greater than LEFT, increased since previous exam. No gross pleural effusion or pneumothorax. IMPRESSION: Increased pulmonary edema. Electronically Signed   By: [name] M.D.   On: 09/14/2016 07:56    STUDIES:  None   CULTURES: None  ANTIBIOTICS: None  SIGNIFICANT EVENTS: 10/20-Pt admitted to [facility] ICU due to acute on chronic hypoxic hypercapnic respiratory failure secondary to AECOPD, pulmonary edema and hypertension PCCM contacted to admit pt to ICU  LINES/TUBES: PIV's x2  ASSESSMENT / PLAN:  PULMONARY A: Acute on chronic hypoxic hypercapnic respiratory failure secondary to AECOPD and pulmonary edema  Hx: OSA (sleep study 10/17/2015-does not require cpap yet) and Bronchitis P:   Bipap for now will wean as tolerated Maintain O2 sats 88% to 92% Continue bronchodilators CXR in am  BNP once 10/20 Repeat ABG today 10/20 Pulmonary  hygiene Hemodialysis today 10/20 High risk for intubation  CARDIOVASCULAR A: Hypertension  Mildly elevated troponin's likely demand ischemia secondary to respiratory failure Hx: HTN and Hyperlipidemia P:  Trend troponin's Continue outpatient aspirin, coreg and cozaar Nitroglycerin gtt for now wean for chest pain resolution or to maintain systolic bp [id number] to 0000000 Telemetry monitoring  RENAL A:   ESRD (pt on hemodialysis) P:   Nephrology consulted appreciate input Hemodialysis today 10/20 Trend BMP's Replace electrolytes as indicated  GASTROINTESTINAL A:  Abdominal pain Hx: GERD P:   Pepcid for GERD Keep NPO for now will resume diet once off  bipap Stat KUB 10/20  HEMATOLOGIC A:   Anemia P:  Subq heparin for VTE prophylaxis Trend CBC's  Transfuse for hgb <7 Monitor for s/sx of bleeding  INFECTIOUS A:   No acute issues P:   Trend WBC's and monitor fever curve Will obtain cultures if pt becomes febrile or develops leukocytosis  ENDOCRINE A:   Hyperglycemia P:   SSI CBG's q4hrs  NEUROLOGIC A:   Lethargy likely secondary to acute on chronic respiratory failure P:   Avoid sedating medications Lights on during the day Frequent orientation Promote family presence at bedside  FAMILY  - Updates: No family currently at bedside to update 09/14/2016  - Inter-disciplinary family meet or Palliative Care meeting due by: 09/21/2016  Marda Stalker, [place] Pager [phone] (please enter 7 digits) PCCM Consult Pager [phone] (please enter 7 digits)  Pt seen and examined with NP, agree with assessment and plan.  Acute respiratory failure with acute hypoxic respiratory failure due to pulmonary edema with htn emergency/ uncontrolled htn. Bilateral crackles on lung ausculation. Will emergently dialyze, discussed with nephro, ED physician, ICU staff. Wean off of bipap after fluid removal with HD.   Marda Stalker, M.D.  09/14/2016  Critical Care Attestation.  I have personally obtained a history, examined the patient, evaluated laboratory and imaging results, formulated the assessment and plan and placed orders. The Patient requires high complexity decision making for assessment and support, frequent evaluation and titration of therapies, application of advanced monitoring technologies and extensive interpretation of multiple databases. The patient has critical illness that could lead imminently to failure of 1 or more organ systems and requires the highest level of physician preparedness to intervene.  Critical Care Time devoted to patient care services described in this note is 35 minutes  and is exclusive of time spent in procedures.

## 2016-09-14 NOTE — ED Notes (Signed)
Dr. Candiss Norse at bedside talking to patient and family.

## 2016-09-14 NOTE — Progress Notes (Signed)
Inpatient Diabetes Program Recommendations  AACE/ADA: New Consensus Statement on Inpatient Glycemic Control (2015)  Target Ranges:  Prepandial:   less than 140 mg/dL      Peak postprandial:   less than 180 mg/dL (1-2 hours)      Critically ill patients:  140 - 180 mg/dL   Lab Results  Component Value Date   GLUCAP 107 (H) 09/14/2016   HGBA1C  07/24/2016    UNABLE TO REPORT A1C DUE TOUNKNOWN INTERFERING FACTOR CAUSING THE ANALYTICAL RANGE TO BE OUTSIDE OF ANALYZER RANGE. SAMPLE SENT TO [facility] FOR AN ALTERNATIVE METHOD    Review of Glycemic Control  Results for Michele Nash, Michele Nash (MRN [id number]) as of 09/14/2016 13:56  Ref. Range 09/14/2016 10:07 09/14/2016 13:27  Glucose-Capillary Latest Ref Range: 65 - 99 mg/dL 162 (H) 107 (H)   Diabetes history: Type 2 Outpatient Diabetes medications: none Current orders for Inpatient glycemic control: Novolog 0-9 units tid  Inpatient Diabetes Program Recommendations:   Agree with current medications for blood sugar management.   Gentry Fitz, RN, BA, MHA, CDE Diabetes Coordinator Inpatient Diabetes Program  [phone] (Team Pager) [phone] ([place]) 09/14/2016 1:57 PM

## 2016-09-14 NOTE — ED Notes (Signed)
Pt is frequently drowsy, however, she does respond to voice and can follow simple commands. Speech is clear.

## 2016-09-14 NOTE — Progress Notes (Signed)
Dialysis started 

## 2016-09-15 ENCOUNTER — Inpatient Hospital Stay: Payer: Medicare Other

## 2016-09-15 DIAGNOSIS — I160 Hypertensive urgency: Secondary | ICD-10-CM

## 2016-09-15 DIAGNOSIS — J9601 Acute respiratory failure with hypoxia: Secondary | ICD-10-CM

## 2016-09-15 LAB — CBC
HCT: 33.2 % — ABNORMAL LOW (ref 35.0–47.0)
Hemoglobin: 11 g/dL — ABNORMAL LOW (ref 12.0–16.0)
MCH: 31.1 pg (ref 26.0–34.0)
MCHC: 32.9 g/dL (ref 32.0–36.0)
MCV: 94.3 fL (ref 80.0–100.0)
PLATELETS: 292 10*3/uL (ref 150–440)
RBC: 3.53 MIL/uL — ABNORMAL LOW (ref 3.80–5.20)
RDW: 16.6 % — AB (ref 11.5–14.5)
WBC: 9.2 10*3/uL (ref 3.6–11.0)

## 2016-09-15 LAB — HEPATITIS B SURFACE ANTIBODY, QUANTITATIVE: Hepatitis B-Post: 25.7 m[IU]/mL (ref >9.9)

## 2016-09-15 LAB — BASIC METABOLIC PANEL
Anion gap: 9 (ref 5–15)
BUN: 42 mg/dL — ABNORMAL HIGH (ref 6–20)
CALCIUM: 8.5 mg/dL — AB (ref 8.9–10.3)
CO2: 33 mmol/L — AB (ref 22–32)
CREATININE: 5.44 mg/dL — AB (ref 0.44–1.00)
Chloride: 95 mmol/L — ABNORMAL LOW (ref 101–111)
GFR calc non Af Amer: 8 mL/min — ABNORMAL LOW (ref >60)
GFR, EST AFRICAN AMERICAN: 9 mL/min — AB (ref >60)
GLUCOSE: 162 mg/dL — AB (ref 65–99)
Potassium: 4.4 mmol/L (ref 3.5–5.1)
Sodium: 137 mmol/L (ref 135–145)

## 2016-09-15 LAB — GLUCOSE, CAPILLARY
GLUCOSE-CAPILLARY: 115 mg/dL — AB (ref 65–99)
GLUCOSE-CAPILLARY: 116 mg/dL — AB (ref 65–99)
GLUCOSE-CAPILLARY: 132 mg/dL — AB (ref 65–99)
Glucose-Capillary: 154 mg/dL — ABNORMAL HIGH (ref 65–99)
Glucose-Capillary: 179 mg/dL — ABNORMAL HIGH (ref 65–99)

## 2016-09-15 LAB — MAGNESIUM: MAGNESIUM: 1.8 mg/dL (ref 1.7–2.4)

## 2016-09-15 LAB — HEPATITIS B SURFACE ANTIGEN: HEP B S AG: NEGATIVE

## 2016-09-15 MED ORDER — FLUTICASONE PROPIONATE 50 MCG/ACT NA SUSP
1.0000 | Freq: Every day | NASAL | Status: DC
  Administered 2016-09-15 – 2016-09-16 (×2): 1 via NASAL
  Filled 2016-09-15: qty 16

## 2016-09-15 MED ORDER — FLUTICASONE PROPIONATE 50 MCG/ACT NA SUSP
1.0000 | Freq: Every day | NASAL | Status: DC
  Filled 2016-09-15: qty 16

## 2016-09-15 MED ORDER — AMLODIPINE BESYLATE 10 MG PO TABS
10.0000 mg | ORAL_TABLET | Freq: Every evening | ORAL | Status: DC
  Administered 2016-09-15: 10 mg via ORAL
  Filled 2016-09-15: qty 1

## 2016-09-15 MED ORDER — LOSARTAN POTASSIUM 50 MG PO TABS
50.0000 mg | ORAL_TABLET | Freq: Every evening | ORAL | Status: DC
  Administered 2016-09-15: 50 mg via ORAL
  Filled 2016-09-15: qty 1

## 2016-09-15 MED ORDER — ORAL CARE MOUTH RINSE
15.0000 mL | Freq: Two times a day (BID) | OROMUCOSAL | Status: DC
  Administered 2016-09-15 (×2): 15 mL via OROMUCOSAL

## 2016-09-15 NOTE — Progress Notes (Signed)
Called report to Dr. Serita Grit, patient will be transferred to floor today. -DR.  09/15/2016

## 2016-09-15 NOTE — Progress Notes (Signed)
Pre hd info 

## 2016-09-15 NOTE — Progress Notes (Signed)
Prior to transport to [place], Probation officer visualized a wad of aluminum foil in patient's bra.  Writer removed and inquired what it was.  Patient took the foil packet quickly and stated she did not know what it was, and that her husband had given it to her to keep.  Pt refused to open packet, discuss what it was, or look in it, continued to deny that she knew what it was.  Writer called AC, who agreed to come up to patient's room.  Before [facility] arrived Probation officer reentered room and was able to persuade patient to open the packet.  This seemed to be because patient remembered what she had asked her husband to bring to her - baking soda.  The contents of the packet did seem to be baking soda.  Patient stated it was ok to discard the packet, she did not want it, although she did still have heartburn.  Packet was placed in her clothing bag, which was then placed in her closet  In room 216.

## 2016-09-15 NOTE — Progress Notes (Addendum)
Patient returned from HD, is eating a bacon biscuit her husband brought to her room while she was in HD, pt insisted on eating the biscuit despite acknowledging she has been repeatedly instructed that she should not consume this much salt.  States she is hungry.  She had called dining to order a tray before starting to eat the biscuit.  Pt instructed again, states she will not eat like this again.  Writer does not think patient has full understanding of how her diet affects her fluid load.  Pt instructed by Probation officer re sodium intake, in response she states "But I don't salt my food."  Pt was instructed again about foods that already have excessive salt in them, like bacon and hot dogs (pt admits to eating hot dogs before becoming SOB and coming to the ED this admission).  Patient verbalizes understanding.

## 2016-09-15 NOTE — Progress Notes (Signed)
Pre hd assessment  

## 2016-09-15 NOTE — Progress Notes (Signed)
Patient to HD with orderly.  Per Dr Juanell Fairly pls transfer patient to Med-Surg with telemetry after HD. Patient in no distress, regularly up to [facility] w/o assist to void very small amounts of urine.  Patient also regularly removes her nasal canula and leaves it hanging from one ear.  No significant drop in O2 levels at these times, lowest seen by writer is 94%.  Will try to send her directly to Med - Surg from HD

## 2016-09-15 NOTE — Progress Notes (Signed)
Post hd vitals 

## 2016-09-15 NOTE — Progress Notes (Signed)
Start of hd 

## 2016-09-15 NOTE — Progress Notes (Signed)
Post hd assessment 

## 2016-09-15 NOTE — Progress Notes (Signed)
[place] [facility] Progess Note    ASSESSMENT/PLAN      ASSESSMENT / PLAN:  PULMONARY A: Acute on chronic hypoxic hypercapnic respiratory failure secondary to AECOPD and pulmonary edema  Hx: OSA (sleep study 10/17/2015-does not require cpap yet) and Bronchitis CXR image 10/21 reviewed; significantly improved pulmonary edema.  P:   Bipap qhs.  Continue bronchodilators  CARDIOVASCULAR A: Hypertension-better controlled.   Mildly elevated troponin's likely demand ischemia secondary to respiratory failure Hx: HTN and Hyperlipidemia P:  Trend troponin's Continue outpatient aspirin, coreg and cozaar Nitroglycerin gtt weaned off.   RENAL A:   ESRD (pt on hemodialysis) P:   Nephrology following.    GASTROINTESTINAL A:  Abdominal pain Hx: GERD P:   Pepcid for GERD Advance diet.   HEMATOLOGIC A:   Anemia P:  Subq heparin for VTE prophylaxis Trend CBC's  Transfuse for hgb <7 Monitor for s/sx of bleeding  INFECTIOUS A:   No acute issues P:   Trend WBC's and monitor fever curve Will obtain cultures if pt becomes febrile or develops leukocytosis  ENDOCRINE A:   Hyperglycemia P:   SSI CBG's q4hrs  NEUROLOGIC A:   Lethargy likely secondary to acute on chronic respiratory failure P:   Avoid sedating medications Lights on during the day Frequent orientation Promote family presence at bedside   SIGNIFICANT EVENTS: 10/20-Pt admitted to [facility] ICU due to acute on chronic hypoxic hypercapnic respiratory failure secondary to AECOPD, pulmonary edema and hypertension PCCM contacted to admit pt to ICU   LINES/TUBES: PIV's x2        ---------------------------------------   ----------------------------------------   Name: Michele Nash MRN: [id number] DOB: 1956-10-05    ADMISSION DATE:  09/14/2016  SUBJECTIVE:   Patient awake, alert, no new complaints.   Review of Systems:  Constitutional: Feels  well. Cardiovascular: No chest pain.  Pulmonary: Denies dyspnea.   The remainder of systems were reviewed and were found to be negative other than what is documented in the HPI.    VITAL SIGNS: Temp:  [97.5 F (36.4 C)-98.7 F (37.1 C)] 97.9 F (36.6 C) (10/21 0735) Pulse Rate:  [32-86] 85 (10/21 0735) Resp:  [12-29] 16 (10/21 0735) BP: (113-184)/(60-120) 170/98 (10/21 0735) SpO2:  [83 %-100 %] 100 % (10/21 0735) FiO2 (%):  [50 %] 50 % (10/20 1423) Weight:  [130 lb 8.2 oz (59.2 kg)-141 lb 1.5 oz (64 kg)] 134 lb 7.7 oz (61 kg) (10/21 0445) HEMODYNAMICS:   VENTILATOR SETTINGS: FiO2 (%):  [50 %] 50 % INTAKE / OUTPUT:  Intake/Output Summary (Last 24 hours) at 09/15/16 0805 Last data filed at 09/14/16 1423  Gross per 24 hour  Intake                0 ml  Output             4000 ml  Net            -4000 ml    PHYSICAL EXAMINATION: Physical Examination:   VS: BP (!) 170/98 (BP Location: Right Arm)   Pulse 85   Temp 97.9 F (36.6 C) (Axillary)   Resp 16   Ht 5\' 5"  (1.651 m)   Wt 134 lb 7.7 oz (61 kg)   SpO2 100%   BMI 22.38 kg/m   General Appearance: No distress  Neuro:without focal findings, mental status normal. HEENT: PERRLA, EOM intact. Pulmonary: scattered creps.  CardiovascularNormal S1,S2.  No m/r/g.   Abdomen: Benign, Soft, non-tender. Renal:  No costovertebral  tenderness  GU:  Not performed at this time. Endocrine: No evident thyromegaly. Skin:   warm, no rashes, no ecchymosis  Extremities: normal, no cyanosis, clubbing.   LABS:   LABORATORY PANEL:   CBC  Recent Labs Lab 09/15/16 0509  WBC 9.2  HGB 11.0*  HCT 33.2*  PLT 292    Chemistries   Recent Labs Lab 09/14/16 1835 09/14/16 2111 09/15/16 0509  NA 137  --  137  K 4.2  --  4.4  CL 91*  --  95*  CO2 35*  --  33*  GLUCOSE 514*  --  162*  BUN 24*  --  42*  CREATININE 4.77*  --  5.44*  CALCIUM 8.4*  --  8.5*  MG 1.6*  --  1.8  PHOS  --  3.3  --   AST 35  --   --   ALT 30  --    --   ALKPHOS 186*  --   --   BILITOT 0.6  --   --      Recent Labs Lab 09/14/16 1327 09/14/16 1736 09/14/16 2200 09/15/16 0017 09/15/16 0449 09/15/16 0720  GLUCAP 107* 397* 117* 115* 154* 116*    Recent Labs Lab 09/14/16 0747  PHART 7.26*  PCO2ART 96*  PO2ART 78*    Recent Labs Lab 09/14/16 0707 09/14/16 1045 09/14/16 1620 09/14/16 1835  AST 43*  --   --  35  ALT 33  --   --  30  ALKPHOS 233*  --   --  186*  BILITOT 0.8  --   --  0.6  ALBUMIN 3.8 3.5 3.8 3.6    Cardiac Enzymes  Recent Labs Lab 09/14/16 2111  TROPONINI 0.06*    RADIOLOGY:  Dg Abd 1 View  Result Date: 09/14/2016 CLINICAL DATA:  New onset abdominal pain EXAM: ABDOMEN - 1 VIEW COMPARISON:  CT scan 05/24/2016. FINDINGS: 1039 hours. Supine portable view the abdomen shows no gaseous bowel dilatation to suggest obstruction. Multiple surgical clips are noted over the anatomic pelvis. No unexpected abdominal pelvic calcification. Visualized bony anatomy is unremarkable. IMPRESSION: Negative. Electronically Signed   By: Misty Stanley M.D.   On: 09/14/2016 10:57   Dg Chest Port 1 View  Result Date: 09/15/2016 CLINICAL DATA:  60 year old female with pulmonary edema EXAM: PORTABLE CHEST 1 VIEW COMPARISON:  Chest radiograph dated 09/14/2016 FINDINGS: There is moderate cardiomegaly. There has been interval improvement in previously seen pulmonary edema. No focal consolidation, pleural effusion, or pneumothorax. No acute osseous pathology. IMPRESSION: Significant interval improvement of the previously seen pulmonary edema. No focal consolidation. Electronically Signed   By: Anner Crete M.D.   On: 09/15/2016 04:49   Dg Chest Portable 1 View  Result Date: 09/14/2016 CLINICAL DATA:  Difficulty breathing, dyspnea, COPD, hypertension, chronic kidney disease analysis, GERD EXAM: PORTABLE CHEST 1 VIEW COMPARISON:  Portable exam 0719 hours compared to 07/24/2016 FINDINGS: Enlargement of cardiac silhouette with  pulmonary vascular congestion. Calcified tortuous thoracic aorta. Diffuse interstitial infiltrates likely pulmonary edema RIGHT greater than LEFT, increased since previous exam. No gross pleural effusion or pneumothorax. IMPRESSION: Increased pulmonary edema. Electronically Signed   By: [name] M.D.   On: 09/14/2016 07:56       --Marda Stalker, MD.  ICU Pager: [phone] [facility] [facility] Office Number: [phone]  Patricia Pesa, M.D.  Vilinda Boehringer, M.D.  Merton Border, M.D  09/15/2016   Critical Care Attestation.  I have personally obtained a history, examined the  patient, evaluated laboratory and imaging results, formulated the assessment and plan and placed orders. The Patient requires high complexity decision making for assessment and support, frequent evaluation and titration of therapies, application of advanced monitoring technologies and extensive interpretation of multiple databases. The patient has critical illness that could lead imminently to failure of 1 or more organ systems and requires the highest level of physician preparedness to intervene.  Critical Care Time devoted to patient care services described in this note is 35 minutes and is exclusive of time spent in procedures.

## 2016-09-15 NOTE — Progress Notes (Signed)
Report called to Santiago Glad on [place].  Patient will transport via wheelchair.

## 2016-09-15 NOTE — Progress Notes (Signed)
  End of hd 

## 2016-09-16 LAB — CBC
HCT: 35.5 % (ref 35.0–47.0)
Hemoglobin: 11.3 g/dL — ABNORMAL LOW (ref 12.0–16.0)
MCH: 30.8 pg (ref 26.0–34.0)
MCHC: 31.9 g/dL — ABNORMAL LOW (ref 32.0–36.0)
MCV: 96.5 fL (ref 80.0–100.0)
PLATELETS: 293 10*3/uL (ref 150–440)
RBC: 3.68 MIL/uL — AB (ref 3.80–5.20)
RDW: 16.8 % — ABNORMAL HIGH (ref 11.5–14.5)
WBC: 8 10*3/uL (ref 3.6–11.0)

## 2016-09-16 LAB — BASIC METABOLIC PANEL
Anion gap: 11 (ref 5–15)
BUN: 57 mg/dL — AB (ref 6–20)
CO2: 32 mmol/L (ref 22–32)
CREATININE: 6.35 mg/dL — AB (ref 0.44–1.00)
Calcium: 8.7 mg/dL — ABNORMAL LOW (ref 8.9–10.3)
Chloride: 95 mmol/L — ABNORMAL LOW (ref 101–111)
GFR, EST AFRICAN AMERICAN: 7 mL/min — AB (ref >60)
GFR, EST NON AFRICAN AMERICAN: 6 mL/min — AB (ref >60)
Glucose, Bld: 133 mg/dL — ABNORMAL HIGH (ref 65–99)
POTASSIUM: 4.8 mmol/L (ref 3.5–5.1)
SODIUM: 138 mmol/L (ref 135–145)

## 2016-09-16 LAB — GLUCOSE, CAPILLARY
GLUCOSE-CAPILLARY: 100 mg/dL — AB (ref 65–99)
GLUCOSE-CAPILLARY: 158 mg/dL — AB (ref 65–99)
Glucose-Capillary: 126 mg/dL — ABNORMAL HIGH (ref 65–99)
Glucose-Capillary: 85 mg/dL (ref 65–99)

## 2016-09-16 NOTE — Progress Notes (Addendum)
SATURATION QUALIFICATIONS: (This note is used to comply with regulatory documentation for home oxygen)  Patient Saturations on Room Air at Rest =98%  Patient Saturations on Room Air while Ambulating = 93%  Patient Saturations on  2  Liters of oxygen while Ambulating = 97 %  Please briefly explain why patient needs home oxygen:

## 2016-09-16 NOTE — Care Management Note (Signed)
Case Management Note  Patient Details  Name: VERNELLA STUDENT MRN: [id number] Date of Birth: Sep 14, 1956  Subjective/Objective:                    Action/Plan:   Expected Discharge Date:                  Expected Discharge Plan:     In-House Referral:     Discharge planning Services     Post Acute Care Choice:    Choice offered to:     DME Arranged:    DME Agency:     HH Arranged:    HH Agency:     Status of Service:     If discussed at [employer] of Stay Meetings, dates discussed:    Additional Comments:  Nehan Flaum A, RN 09/16/2016, 11:42 AM

## 2016-09-16 NOTE — Discharge Summary (Signed)
Michele Nash, is a 60 y.o. female  DOB 1955-12-21  MRN [id number].  Admission date:  09/14/2016  Admitting Physician  Dustin Flock, MD  Discharge Date:  09/16/2016   Primary MD  [name], MD  Recommendations for primary care physician for things to follow:   With primary doctor in 1 week Follow up with nephrology, dialysis as the scheduled Monday, Wednesday, Friday.   Admission Diagnosis  Acute pulmonary edema (HCC) [J81.0]   Discharge Diagnosis  Acute pulmonary edema ([place]) [J81.0]   Active Problems:   Pulmonary edema      Past Medical History:  Diagnosis Date  . Anemia   . Apnea, sleep    for sleep study 10/17/15-no cpap yet  . Arthritis   . Bronchitis   . Chronic kidney disease   . COPD (chronic obstructive pulmonary disease) ([place])   . Depression   . Dialysis patient ([place]) 2014  . GERD (gastroesophageal reflux disease)   . Headache   . Hyperlipidemia   . Hypertension   . Obesity   . Renal dialysis device, implant, or graft complication    RIGHT CHEST CATH    Past Surgical History:  Procedure Laterality Date  . ABDOMINAL HYSTERECTOMY    . COLONOSCOPY  2011  . COLONOSCOPY WITH PROPOFOL N/A 09/20/2015   Procedure: COLONOSCOPY WITH PROPOFOL;  Surgeon: Lucilla Lame, MD;  Location: [facility] ENDOSCOPY;  Service: Endoscopy;  Laterality: N/A;  . DIALYSIS FISTULA CREATION    . ESOPHAGOGASTRODUODENOSCOPY N/A 07/13/2016   Procedure: ESOPHAGOGASTRODUODENOSCOPY (EGD);  Surgeon: Lollie Sails, MD;  Location: [facility] ENDOSCOPY;  Service: Endoscopy;  Laterality: N/A;  . ESOPHAGOGASTRODUODENOSCOPY (EGD) WITH PROPOFOL N/A 06/02/2016   Procedure: ESOPHAGOGASTRODUODENOSCOPY (EGD) WITH PROPOFOL;  Surgeon: Manya Silvas, MD;  Location: [facility] ENDOSCOPY;  Service: Endoscopy;  Laterality: N/A;  . PERIPHERAL VASCULAR  CATHETERIZATION N/A 05/10/2015   Procedure: A/V Shuntogram/Fistulagram;  Surgeon: Katha Cabal, MD;  Location: [place] CV LAB;  Service: Cardiovascular;  Laterality: N/A;  . PERIPHERAL VASCULAR CATHETERIZATION Left 05/10/2015   Procedure: A/V Shunt Intervention;  Surgeon: Katha Cabal, MD;  Location: [place] CV LAB;  Service: Cardiovascular;  Laterality: Left;  . PERIPHERAL VASCULAR CATHETERIZATION Left 08/23/2015   Procedure: A/V Shuntogram/Fistulagram;  Surgeon: Katha Cabal, MD;  Location: [place] CV LAB;  Service: Cardiovascular;  Laterality: Left;  . PERIPHERAL VASCULAR CATHETERIZATION N/A 08/23/2015   Procedure: A/V Shunt Intervention;  Surgeon: Katha Cabal, MD;  Location: [place] CV LAB;  Service: Cardiovascular;  Laterality: N/A;  . PERIPHERAL VASCULAR CATHETERIZATION  08/23/2015   Procedure: Dialysis/Perma Catheter Insertion;  Surgeon: Katha Cabal, MD;  Location: [place] CV LAB;  Service: Cardiovascular;;  . PERIPHERAL VASCULAR CATHETERIZATION N/A 01/03/2016   Procedure: Dialysis/Perma Catheter Removal;  Surgeon: Katha Cabal, MD;  Location: [place] CV LAB;  Service: Cardiovascular;  Laterality: N/A;  . REVISON OF ARTERIOVENOUS FISTULA Left 10/28/2015   Procedure: REVISON OF ARTERIOVENOUS FISTULA WITH ARTEGRAFT;  Surgeon: Katha Cabal, MD;  Location: [facility] ORS;  Service: Vascular;  Laterality: Left;  . WOUND DEBRIDEMENT Left 10/28/2015   Procedure: Resection of shoulder cyst ( left );  Surgeon: Katha Cabal, MD;  Location: [facility] ORS;  Service: Vascular;  Laterality: Left;       History of present illness and  Hospital Course:     Kindly see H&P for history of present illness and admission details, please review complete Labs, Consult reports and Test reports for all details in brief  HPI  from the history and physical done on the day of admission 60 year old female patient with history of ESRD on hemodialysis,  hypertension, hyperlipidemia, GERD, COPD, obstructive sleep apnea, admitted to hospital on 20th of October because of shortness of breath, missed hemodialysis sessions. Patient also was very lethargic on admission. Admitted to ICU secondary to acute on chronic hypercapnic respiratory failure secondary to pulmonary edema, COPD exacerbation.   Hospital Course  1 acute on chronic respiratory failure: Hypercapnia and respiratory acidosis on admission: Admitted to intensive care unit, started on BiPAP, emergency hemodialysis of 10 because of pulmonary edema in the contest of ESRD, missed hemodialysis sessions. Seen by nephrology. Patient received emergency hemodialysis on 20th of October morning.  #2. ESRD on hemodialysis Monday Wednesday Friday. Patient received hemodialysis on 20th, 21st. And her routine hemodialysis will be tomorrow. #3 COPD: No wheezing at this time. When she came she had wheezing, seen by pulmonary and critical care. Patient can resume home medications, oxygen at night. Patient the BiPAP, room air saturations 98%.  #4 elevated blood sugars up to 500 at one time.. By diabetes coordinator, patient was given insulin with sliding scale coverage. But no medicines at home are documented.  #5. Hypertension: Controlled  #6.AMS: secondary to metabolic encephalopathy from hypercapnia and respiratory distress due to pulmonary edema: Improved.  Depression: Continue Zoloft.  History of recent acute blood loss anemia with nonbleeding gastric ulcers. By recent EGD.   Discharge Condition: stable   Follow UP      Discharge Instructions  and  Discharge Medications        Medication List    TAKE these medications   albuterol 108 (90 Base) MCG/ACT inhaler Commonly known as:  PROVENTIL HFA;VENTOLIN HFA Inhale 2 puffs into the lungs every 6 (six) hours as needed for wheezing or shortness of breath.   ALPRAZolam 0.5 MG tablet Commonly known as:  XANAX Take 0.5 mg by mouth 2 (two)  times daily.   amLODipine 10 MG tablet Commonly known as:  NORVASC Take 10 mg by mouth daily.   calcium acetate 667 MG capsule Commonly known as:  PHOSLO Take 1,334 mg by mouth 3 (three) times daily.   calcium carbonate 600 MG Tabs tablet Commonly known as:  OS-CAL Take 600 mg by mouth 2 (two) times daily with a meal.   carvedilol 25 MG tablet Commonly known as:  COREG Take 1 tablet (25 mg total) by mouth 2 (two) times daily with a meal.   cinacalcet 30 MG tablet Commonly known as:  SENSIPAR Take 60 mg by mouth daily with breakfast.   ezetimibe 10 MG tablet Commonly known as:  ZETIA Take 10 mg by mouth daily.   fluticasone 50 MCG/ACT nasal spray Commonly known as:  FLONASE Place 2 sprays into both nostrils daily as needed for rhinitis.   Fluticasone-Salmeterol 250-50 MCG/DOSE Aepb Commonly known as:  ADVAIR Inhale 1 puff into the lungs 2 (two) times daily.   Fluticasone-Salmeterol 100-50 MCG/DOSE Aepb Commonly known as:  ADVAIR Inhale 1 puff into the lungs 2 (two) times daily.   furosemide 40 MG tablet Commonly known as:  LASIX Take 40 mg by mouth every Tuesday, Thursday, Saturday, and Sunday.   lidocaine-prilocaine cream Commonly known as:  EMLA Apply 1 application topically as needed (prior to accessing port).   losartan 100 MG tablet Commonly known as:  COZAAR Take 100 mg by mouth daily.   multivitamin Tabs tablet Take 1 tablet by mouth at bedtime.   omeprazole 20 MG  capsule Commonly known as:  PRILOSEC Take 20 mg by mouth daily.   pantoprazole 40 MG tablet Commonly known as:  PROTONIX Take 1 tablet (40 mg total) by mouth 2 (two) times daily.   sertraline 50 MG tablet Commonly known as:  ZOLOFT Take 50 mg by mouth daily.         Diet and Activity recommendation: See Discharge Instructions above   Consults obtained - nephrology,PCCM   Major procedures and Radiology Reports - PLEASE review detailed and final reports for all details, in  brief -     Dg Abd 1 View  Result Date: 09/14/2016 CLINICAL DATA:  New onset abdominal pain EXAM: ABDOMEN - 1 VIEW COMPARISON:  CT scan 05/24/2016. FINDINGS: 1039 hours. Supine portable view the abdomen shows no gaseous bowel dilatation to suggest obstruction. Multiple surgical clips are noted over the anatomic pelvis. No unexpected abdominal pelvic calcification. Visualized bony anatomy is unremarkable. IMPRESSION: Negative. Electronically Signed   By: Misty Stanley M.D.   On: 09/14/2016 10:57   Dg Chest Port 1 View  Result Date: 09/15/2016 CLINICAL DATA:  60 year old female with pulmonary edema EXAM: PORTABLE CHEST 1 VIEW COMPARISON:  Chest radiograph dated 09/14/2016 FINDINGS: There is moderate cardiomegaly. There has been interval improvement in previously seen pulmonary edema. No focal consolidation, pleural effusion, or pneumothorax. No acute osseous pathology. IMPRESSION: Significant interval improvement of the previously seen pulmonary edema. No focal consolidation. Electronically Signed   By: Anner Crete M.D.   On: 09/15/2016 04:49   Dg Chest Portable 1 View  Result Date: 09/14/2016 CLINICAL DATA:  Difficulty breathing, dyspnea, COPD, hypertension, chronic kidney disease analysis, GERD EXAM: PORTABLE CHEST 1 VIEW COMPARISON:  Portable exam 0719 hours compared to 07/24/2016 FINDINGS: Enlargement of cardiac silhouette with pulmonary vascular congestion. Calcified tortuous thoracic aorta. Diffuse interstitial infiltrates likely pulmonary edema RIGHT greater than LEFT, increased since previous exam. No gross pleural effusion or pneumothorax. IMPRESSION: Increased pulmonary edema. Electronically Signed   By: [name] M.D.   On: 09/14/2016 07:56    Micro Results    Recent Results (from the past 240 hour(s))  MRSA PCR Screening     Status: None   Collection Time: 09/14/16  5:52 PM  Result Value Ref Range Status   MRSA by PCR NEGATIVE NEGATIVE Final    Comment:        The  GeneXpert MRSA Assay (FDA approved for NASAL specimens only), is one component of a comprehensive MRSA colonization surveillance program. It is not intended to diagnose MRSA infection nor to guide or monitor treatment for MRSA infections.        Today   Subjective:   Michele Nash today has no headache,no chest abdominal pain,no new weakness tingling or numbness, feels much better wants to go home today.   Objective:   Blood pressure (!) 160/73, pulse 65, temperature 98.2 F (36.8 C), temperature source Oral, resp. rate 18, height 5\' 5"  (1.651 m), weight 62.4 kg (137 lb 8 oz), SpO2 100 %.   Intake/Output Summary (Last 24 hours) at 09/16/16 0947 Last data filed at 09/16/16 0400  Gross per 24 hour  Intake              597 ml  Output             1735 ml  Net            -1138 ml    Exam Awake Alert, Oriented x 3, No new F.N deficits, Normal affect [place].AT,PERRAL  Supple Neck,No JVD, No cervical lymphadenopathy appriciated.  Symmetrical Chest wall movement, Good air movement bilaterally, CTAB RRR,No Gallops,Rubs or new Murmurs, No Parasternal Heave +ve B.Sounds, Abd Soft, Non tender, No organomegaly appriciated, No rebound -guarding or rigidity. No Cyanosis, Clubbing or edema, No new Rash or bruise  Data Review   CBC w Diff: Lab Results  Component Value Date   WBC 8.0 09/16/2016   HGB 11.3 (L) 09/16/2016   HGB 11.4 (L) 10/10/2014   HCT 35.5 09/16/2016   HCT 33.8 (L) 10/10/2014   PLT 293 09/16/2016   PLT 304 10/10/2014   LYMPHOPCT 6 09/14/2016   LYMPHOPCT 19.9 06/15/2014   MONOPCT 4 09/14/2016   MONOPCT 10.2 06/15/2014   EOSPCT 0 09/14/2016   EOSPCT 4.4 06/15/2014   BASOPCT 0 09/14/2016   BASOPCT 1.0 06/15/2014    CMP: Lab Results  Component Value Date   NA 138 09/16/2016   NA 141 10/10/2014   K 4.8 09/16/2016   K 3.7 10/10/2014   CL 95 (L) 09/16/2016   CL 96 (L) 10/10/2014   CO2 32 09/16/2016   CO2 37 (H) 10/10/2014   BUN 57 (H) 09/16/2016   BUN 40  (H) 10/10/2014   CREATININE 6.35 (H) 09/16/2016   CREATININE 7.68 (H) 10/10/2014   PROT 6.9 09/14/2016   PROT 7.0 10/10/2014   ALBUMIN 3.6 09/14/2016   ALBUMIN 3.3 (L) 10/10/2014   BILITOT 0.6 09/14/2016   BILITOT 0.3 10/10/2014   ALKPHOS 186 (H) 09/14/2016   ALKPHOS 84 10/10/2014   AST 35 09/14/2016   AST 16 10/10/2014   ALT 30 09/14/2016   ALT 20 10/10/2014  .   Total Time in preparing paper work, data evaluation and todays exam - 16 minutes  Andreas Sobolewski M.D on 09/16/2016 at 9:47 AM    Note: This dictation was prepared with Dragon dictation along with smaller phrase technology. Any transcriptional errors that result from this process are unintentional.

## 2016-09-16 NOTE — Progress Notes (Signed)
Subjective:  4000 cc removed with yesterday Off biPAP Feels much better  Objective:  Vital signs in last 24 hours:  Temp:  [97.7 F (36.5 C)-98.6 F (37 C)] 98.2 F (36.8 C) (10/22 0520) Pulse Rate:  [56-81] 66 (10/22 1106) Resp:  [16-22] 18 (10/22 0520) BP: (125-185)/(60-123) 160/73 (10/22 0520) SpO2:  [98 %-100 %] 98 % (10/22 1106) Weight:  [61.1 kg (134 lb 11.2 oz)-62.4 kg (137 lb 8 oz)] 62.4 kg (137 lb 8 oz) (10/22 0436)  Weight change: -5.003 kg (-11 lb 0.5 oz) Filed Weights   09/15/16 1315 09/15/16 1352 09/16/16 0436  Weight: 62.1 kg (136 lb 14.5 oz) 61.1 kg (134 lb 11.2 oz) 62.4 kg (137 lb 8 oz)    Intake/Output:    Intake/Output Summary (Last 24 hours) at 09/16/16 1136 Last data filed at 09/16/16 1033  Gross per 24 hour  Intake              837 ml  Output             1720 ml  Net             -883 ml     Physical Exam: General: NAD  HEENT  O2  Neck distended neck veins  Pulm/lungs Mild basilar crackles bilaterally  CVS/Heart tachycardic  Abdomen:  Soft, nontender  Extremities: trace pitting edema  Neurologic: Alert, oriented  Skin: No acute rashes  Access: AV fistula       Basic Metabolic Panel:   Recent Labs Lab 09/14/16 1045 09/14/16 1620 09/14/16 1835 09/14/16 2111 09/15/16 0509 09/16/16 0609  NA 142 141 137  --  137 138  K 5.1 3.9 4.2  --  4.4 4.8  CL 96* 96* 91*  --  95* 95*  CO2 35* 34* 35*  --  33* 32  GLUCOSE 162* 215* 514*  --  162* 133*  BUN 51* 19 24*  --  42* 57*  CREATININE 7.74* 4.15* 4.77*  --  5.44* 6.35*  CALCIUM 8.3* 9.0 8.4*  --  8.5* 8.7*  MG  --   --  1.6*  --  1.8  --   PHOS 4.3 3.2  --  3.3  --   --      CBC:  Recent Labs Lab 09/14/16 0707 09/14/16 1835 09/15/16 0509 09/16/16 0609  WBC 11.0 4.7 9.2 8.0  NEUTROABS  --  4.2  --   --   HGB 11.9* 11.5* 11.0* 11.3*  HCT 36.9 36.8 33.2* 35.5  MCV 98.2 98.7 94.3 96.5  PLT 354 280 292 293      Microbiology:  Recent Results (from the past 720  hour(s))  MRSA PCR Screening     Status: None   Collection Time: 09/14/16  5:52 PM  Result Value Ref Range Status   MRSA by PCR NEGATIVE NEGATIVE Final    Comment:        The GeneXpert MRSA Assay (FDA approved for NASAL specimens only), is one component of a comprehensive MRSA colonization surveillance program. It is not intended to diagnose MRSA infection nor to guide or monitor treatment for MRSA infections.     Coagulation Studies: No results for input(s): LABPROT, INR in the last 72 hours.  Urinalysis: No results for input(s): COLORURINE, LABSPEC, PHURINE, GLUCOSEU, HGBUR, BILIRUBINUR, KETONESUR, PROTEINUR, UROBILINOGEN, NITRITE, LEUKOCYTESUR in the last 72 hours.  Invalid input(s): APPERANCEUR    Imaging: Dg Chest Port 1 View  Result Date: 09/15/2016 CLINICAL DATA:  60 year old female with  pulmonary edema EXAM: PORTABLE CHEST 1 VIEW COMPARISON:  Chest radiograph dated 09/14/2016 FINDINGS: There is moderate cardiomegaly. There has been interval improvement in previously seen pulmonary edema. No focal consolidation, pleural effusion, or pneumothorax. No acute osseous pathology. IMPRESSION: Significant interval improvement of the previously seen pulmonary edema. No focal consolidation. Electronically Signed   By: Anner Crete M.D.   On: 09/15/2016 04:49     Medications:     . amLODipine  10 mg Oral QPM  . calcium acetate  1,334 mg Oral TID WC  . carvedilol  12.5 mg Oral BID WC  . ezetimibe  10 mg Oral Daily  . famotidine  20 mg Oral QHS  . fluticasone  1 spray Each Nare Daily  . fluticasone furoate-vilanterol  1 puff Inhalation Daily  . heparin  5,000 Units Subcutaneous Q8H  . insulin aspart  0-9 Units Subcutaneous Q4H  . losartan  50 mg Oral QPM  . mouth rinse  15 mL Mouth Rinse BID  . sertraline  50 mg Oral Daily   acetaminophen, albuterol, ALPRAZolam, calcium carbonate, hydrALAZINE, ondansetron (ZOFRAN) IV  Assessment/ Plan:  60 y.o.African American  female with HTN, hyperlipidemia, diastolic heart failure, hypertension, COPD, tobacco abuse, AOCD, SHPTH, LUE AVF, ESRD 02/2013, admission for severe anemia/black stools 05/28/16, nonbleeding gastric ulcers on EGD 8/181/17, presents for acute shortness of breath and respiratory failure  [facility]/MWF  1. End-stage renal disease on hemodialysis MWF.  -urgent dialysis done on Friday; 4000 cc fluid removed - outpatient EDW 61 kg.   - Extra HD for volume optimization  2.  Acute respiratory failure- hypercarbic and due to acute Pulm edema - Extra HD today . UF goal as tolerated  3. Secondary hyperparathyroidism.  Monitor Phos  4. AOCKD - EPO with treatment once Hgb < 11   LOS: 2 Michele Nash 10/22/201711:36 AM

## 2016-09-16 NOTE — Discharge Instructions (Signed)
Resume home o2  2litres [place]

## 2016-09-16 NOTE — Progress Notes (Signed)
Alert and oriented. Vital signs stable . No signs of acute distress. Discharge instructions given. Patient verbalized understanding. No other issues noted at this time.    

## 2016-09-16 NOTE — Care Management Note (Addendum)
Case Management Note  Patient Details  Name: ADWITA FUNKE MRN: [id number] Date of Birth: 29-Aug-1956  Subjective/Objective:     Ms Cuadras did not qualify for continuous home oxygen.               Action/Plan:   Expected Discharge Date:                  Expected Discharge Plan:     In-House Referral:     Discharge planning Services     Post Acute Care Choice:    Choice offered to:     DME Arranged:    DME Agency:     HH Arranged:    HH Agency:     Status of Service:     If discussed at [employer] of Stay Meetings, dates discussed:    Additional Comments:  Latasha Puskas A, RN 09/16/2016, 11:40 AM

## 2016-09-16 NOTE — Progress Notes (Signed)
Subjective:  4000 cc removed with HD on Friday and 1700 cc removed saturday Feels much better; asking about going home  Able to eat without [place] and vomiting.  Nursing staff saw patient eating outside food. Advised to follow low salt diet  Objective:  Vital signs in last 24 hours:  Temp:  [97.7 F (36.5 C)-98.6 F (37 C)] 98.2 F (36.8 C) (10/22 0520) Pulse Rate:  [56-81] 66 (10/22 1106) Resp:  [16-22] 18 (10/22 0520) BP: (125-185)/(60-123) 160/73 (10/22 0520) SpO2:  [98 %-100 %] 98 % (10/22 1106) Weight:  [61.1 kg (134 lb 11.2 oz)-62.4 kg (137 lb 8 oz)] 62.4 kg (137 lb 8 oz) (10/22 0436)  Weight change: -5.003 kg (-11 lb 0.5 oz) Filed Weights   09/15/16 1315 09/15/16 1352 09/16/16 0436  Weight: 62.1 kg (136 lb 14.5 oz) 61.1 kg (134 lb 11.2 oz) 62.4 kg (137 lb 8 oz)    Intake/Output:    Intake/Output Summary (Last 24 hours) at 09/16/16 1138 Last data filed at 09/16/16 1033  Gross per 24 hour  Intake              837 ml  Output             1720 ml  Net             -883 ml     Physical Exam: General: NAD  HEENT [place] O2  Neck supple  Pulm/lungs Clear to auscultation bilaterally  CVS/Heart tachycardic  Abdomen:  Soft, nontender  Extremities: trace pitting edema  Neurologic: Alert, oriented  Skin: No acute rashes  Access: AV fistula       Basic Metabolic Panel:   Recent Labs Lab 09/14/16 1045 09/14/16 1620 09/14/16 1835 09/14/16 2111 09/15/16 0509 09/16/16 0609  NA 142 141 137  --  137 138  K 5.1 3.9 4.2  --  4.4 4.8  CL 96* 96* 91*  --  95* 95*  CO2 35* 34* 35*  --  33* 32  GLUCOSE 162* 215* 514*  --  162* 133*  BUN 51* 19 24*  --  42* 57*  CREATININE 7.74* 4.15* 4.77*  --  5.44* 6.35*  CALCIUM 8.3* 9.0 8.4*  --  8.5* 8.7*  MG  --   --  1.6*  --  1.8  --   PHOS 4.3 3.2  --  3.3  --   --      CBC:  Recent Labs Lab 09/14/16 0707 09/14/16 1835 09/15/16 0509 09/16/16 0609  WBC 11.0 4.7 9.2 8.0  NEUTROABS  --  4.2  --   --   HGB 11.9* 11.5*  11.0* 11.3*  HCT 36.9 36.8 33.2* 35.5  MCV 98.2 98.7 94.3 96.5  PLT 354 280 292 293      Microbiology:  Recent Results (from the past 720 hour(s))  MRSA PCR Screening     Status: None   Collection Time: 09/14/16  5:52 PM  Result Value Ref Range Status   MRSA by PCR NEGATIVE NEGATIVE Final    Comment:        The GeneXpert MRSA Assay (FDA approved for NASAL specimens only), is one component of a comprehensive MRSA colonization surveillance program. It is not intended to diagnose MRSA infection nor to guide or monitor treatment for MRSA infections.     Coagulation Studies: No results for input(s): LABPROT, INR in the last 72 hours.  Urinalysis: No results for input(s): COLORURINE, LABSPEC, PHURINE, GLUCOSEU, HGBUR, BILIRUBINUR, KETONESUR, PROTEINUR, UROBILINOGEN, NITRITE,  LEUKOCYTESUR in the last 72 hours.  Invalid input(s): APPERANCEUR    Imaging: Dg Chest Port 1 View  Result Date: 09/15/2016 CLINICAL DATA:  60 year old female with pulmonary edema EXAM: PORTABLE CHEST 1 VIEW COMPARISON:  Chest radiograph dated 09/14/2016 FINDINGS: There is moderate cardiomegaly. There has been interval improvement in previously seen pulmonary edema. No focal consolidation, pleural effusion, or pneumothorax. No acute osseous pathology. IMPRESSION: Significant interval improvement of the previously seen pulmonary edema. No focal consolidation. Electronically Signed   By: Anner Crete M.D.   On: 09/15/2016 04:49     Medications:     . amLODipine  10 mg Oral QPM  . calcium acetate  1,334 mg Oral TID WC  . carvedilol  12.5 mg Oral BID WC  . ezetimibe  10 mg Oral Daily  . famotidine  20 mg Oral QHS  . fluticasone  1 spray Each Nare Daily  . fluticasone furoate-vilanterol  1 puff Inhalation Daily  . heparin  5,000 Units Subcutaneous Q8H  . insulin aspart  0-9 Units Subcutaneous Q4H  . losartan  50 mg Oral QPM  . mouth rinse  15 mL Mouth Rinse BID  . sertraline  50 mg Oral Daily    acetaminophen, albuterol, ALPRAZolam, calcium carbonate, hydrALAZINE, ondansetron (ZOFRAN) IV  Assessment/ Plan:  60 y.o.African American female with HTN, hyperlipidemia, diastolic heart failure, hypertension, COPD, tobacco abuse, AOCD, SHPTH, LUE AVF, ESRD 02/2013, admission for severe anemia/black stools 05/28/16, nonbleeding gastric ulcers on EGD 8/181/17, presents for acute shortness of breath and respiratory failure  [facility]/MWF  1. End-stage renal disease on hemodialysis MWF.  -urgent dialysis done on Friday; 4000 cc fluid removed; 1700 cc removed saturday - outpatient EDW 61 kg. Today 62 kg  -  OF to d/c home/  Patient to f/u with outpatient HD tomorrow morning  2.  Acute respiratory failure- hypercarbic and due to acute Pulm edema -  Improved with UF  3. Secondary hyperparathyroidism.  Monitor Phos  4. AOCKD - continue EPO per protocol as outpatient   LOS: 2 Michele Nash 10/22/201711:38 AM

## 2016-09-16 NOTE — Care Management Important Message (Signed)
Important Message  Patient Details  Name: Michele Nash MRN: [id number] Date of Birth: 17-Jul-1956   Medicare Important Message Given:  Yes    Carles Collet, RN 09/16/2016, 9:07 AM

## 2016-10-06 ENCOUNTER — Emergency Department: Payer: Medicare Other

## 2016-10-06 ENCOUNTER — Inpatient Hospital Stay
Admission: EM | Admit: 2016-10-06 | Discharge: 2016-10-11 | DRG: 189 | Disposition: A | Discharge status: Home / Self Care | Payer: Medicare Other | Attending: Internal Medicine | Admitting: Internal Medicine

## 2016-10-06 ENCOUNTER — Encounter: Payer: Self-pay | Admitting: Emergency Medicine

## 2016-10-06 DIAGNOSIS — E785 Hyperlipidemia, unspecified: Secondary | ICD-10-CM | POA: Diagnosis present

## 2016-10-06 DIAGNOSIS — R52 Pain, unspecified: Secondary | ICD-10-CM

## 2016-10-06 DIAGNOSIS — Z87891 Personal history of nicotine dependence: Secondary | ICD-10-CM

## 2016-10-06 DIAGNOSIS — N2581 Secondary hyperparathyroidism of renal origin: Secondary | ICD-10-CM | POA: Diagnosis present

## 2016-10-06 DIAGNOSIS — F1410 Cocaine abuse, uncomplicated: Secondary | ICD-10-CM | POA: Diagnosis present

## 2016-10-06 DIAGNOSIS — S0083XA Contusion of other part of head, initial encounter: Secondary | ICD-10-CM

## 2016-10-06 DIAGNOSIS — R296 Repeated falls: Secondary | ICD-10-CM | POA: Diagnosis present

## 2016-10-06 DIAGNOSIS — J449 Chronic obstructive pulmonary disease, unspecified: Secondary | ICD-10-CM | POA: Diagnosis present

## 2016-10-06 DIAGNOSIS — J9602 Acute respiratory failure with hypercapnia: Secondary | ICD-10-CM | POA: Diagnosis present

## 2016-10-06 DIAGNOSIS — J9622 Acute and chronic respiratory failure with hypercapnia: Principal | ICD-10-CM | POA: Diagnosis present

## 2016-10-06 DIAGNOSIS — I132 Hypertensive heart and chronic kidney disease with heart failure and with stage 5 chronic kidney disease, or end stage renal disease: Secondary | ICD-10-CM | POA: Diagnosis present

## 2016-10-06 DIAGNOSIS — R441 Visual hallucinations: Secondary | ICD-10-CM | POA: Diagnosis present

## 2016-10-06 DIAGNOSIS — F419 Anxiety disorder, unspecified: Secondary | ICD-10-CM | POA: Diagnosis present

## 2016-10-06 DIAGNOSIS — M1990 Unspecified osteoarthritis, unspecified site: Secondary | ICD-10-CM | POA: Diagnosis present

## 2016-10-06 DIAGNOSIS — T44995A Adverse effect of other drug primarily affecting the autonomic nervous system, initial encounter: Secondary | ICD-10-CM | POA: Diagnosis present

## 2016-10-06 DIAGNOSIS — R4182 Altered mental status, unspecified: Secondary | ICD-10-CM | POA: Diagnosis present

## 2016-10-06 DIAGNOSIS — F329 Major depressive disorder, single episode, unspecified: Secondary | ICD-10-CM | POA: Diagnosis present

## 2016-10-06 DIAGNOSIS — Z8711 Personal history of peptic ulcer disease: Secondary | ICD-10-CM | POA: Diagnosis not present

## 2016-10-06 DIAGNOSIS — Z992 Dependence on renal dialysis: Secondary | ICD-10-CM

## 2016-10-06 DIAGNOSIS — I169 Hypertensive crisis, unspecified: Secondary | ICD-10-CM | POA: Diagnosis present

## 2016-10-06 DIAGNOSIS — S0011XA Contusion of right eyelid and periocular area, initial encounter: Secondary | ICD-10-CM | POA: Diagnosis present

## 2016-10-06 DIAGNOSIS — R42 Dizziness and giddiness: Secondary | ICD-10-CM

## 2016-10-06 DIAGNOSIS — Z882 Allergy status to sulfonamides status: Secondary | ICD-10-CM | POA: Diagnosis not present

## 2016-10-06 DIAGNOSIS — W19XXXA Unspecified fall, initial encounter: Secondary | ICD-10-CM

## 2016-10-06 DIAGNOSIS — K219 Gastro-esophageal reflux disease without esophagitis: Secondary | ICD-10-CM | POA: Diagnosis present

## 2016-10-06 DIAGNOSIS — W010XXA Fall on same level from slipping, tripping and stumbling without subsequent striking against object, initial encounter: Secondary | ICD-10-CM | POA: Diagnosis present

## 2016-10-06 DIAGNOSIS — W19XXXS Unspecified fall, sequela: Secondary | ICD-10-CM

## 2016-10-06 DIAGNOSIS — G4733 Obstructive sleep apnea (adult) (pediatric): Secondary | ICD-10-CM | POA: Diagnosis present

## 2016-10-06 DIAGNOSIS — R400 Somnolence: Secondary | ICD-10-CM | POA: Diagnosis not present

## 2016-10-06 DIAGNOSIS — R262 Difficulty in walking, not elsewhere classified: Secondary | ICD-10-CM

## 2016-10-06 DIAGNOSIS — N186 End stage renal disease: Secondary | ICD-10-CM | POA: Diagnosis present

## 2016-10-06 DIAGNOSIS — Z9981 Dependence on supplemental oxygen: Secondary | ICD-10-CM

## 2016-10-06 LAB — COMPREHENSIVE METABOLIC PANEL
ALT: 26 U/L (ref 14–54)
ANION GAP: 9 (ref 5–15)
AST: 33 U/L (ref 15–41)
Albumin: 3.9 g/dL (ref 3.5–5.0)
Alkaline Phosphatase: 151 U/L — ABNORMAL HIGH (ref 38–126)
BUN: 29 mg/dL — ABNORMAL HIGH (ref 6–20)
CHLORIDE: 100 mmol/L — AB (ref 101–111)
CO2: 30 mmol/L (ref 22–32)
Calcium: 8.4 mg/dL — ABNORMAL LOW (ref 8.9–10.3)
Creatinine, Ser: 6.47 mg/dL — ABNORMAL HIGH (ref 0.44–1.00)
GFR, EST AFRICAN AMERICAN: 7 mL/min — AB (ref >60)
GFR, EST NON AFRICAN AMERICAN: 6 mL/min — AB (ref >60)
Glucose, Bld: 177 mg/dL — ABNORMAL HIGH (ref 65–99)
POTASSIUM: 3.7 mmol/L (ref 3.5–5.1)
SODIUM: 139 mmol/L (ref 135–145)
Total Bilirubin: 0.7 mg/dL (ref 0.3–1.2)
Total Protein: 7.4 g/dL (ref 6.5–8.1)

## 2016-10-06 LAB — CBC WITH DIFFERENTIAL/PLATELET
Basophils Absolute: 0 10*3/uL (ref 0–0.1)
Basophils Relative: 1 %
EOS ABS: 0.1 10*3/uL (ref 0–0.7)
EOS PCT: 1 %
HCT: 40 % (ref 35.0–47.0)
Hemoglobin: 12.3 g/dL (ref 12.0–16.0)
LYMPHS ABS: 1.1 10*3/uL (ref 1.0–3.6)
LYMPHS PCT: 14 %
MCH: 29.9 pg (ref 26.0–34.0)
MCHC: 30.9 g/dL — ABNORMAL LOW (ref 32.0–36.0)
MCV: 96.9 fL (ref 80.0–100.0)
MONO ABS: 1.1 10*3/uL — AB (ref 0.2–0.9)
Monocytes Relative: 14 %
Neutro Abs: 5.5 10*3/uL (ref 1.4–6.5)
Neutrophils Relative %: 70 %
Platelets: 288 10*3/uL (ref 150–440)
RBC: 4.12 MIL/uL (ref 3.80–5.20)
RDW: 17.7 % — AB (ref 11.5–14.5)
WBC: 7.9 10*3/uL (ref 3.6–11.0)

## 2016-10-06 LAB — BLOOD GAS, ARTERIAL
Acid-Base Excess: 4.2 mmol/L — ABNORMAL HIGH (ref 0.0–2.0)
Bicarbonate: 31.9 mmol/L — ABNORMAL HIGH (ref 20.0–28.0)
Delivery systems: POSITIVE
EXPIRATORY PAP: 5
FIO2: 0.28
INSPIRATORY PAP: 12
O2 Saturation: 94.9 %
PH ART: 7.32 — AB (ref 7.350–7.450)
Patient temperature: 37
pCO2 arterial: 62 mmHg — ABNORMAL HIGH (ref 32.0–48.0)
pO2, Arterial: 81 mmHg — ABNORMAL LOW (ref 83.0–108.0)

## 2016-10-06 LAB — BLOOD GAS, VENOUS
Acid-Base Excess: 3.2 mmol/L — ABNORMAL HIGH (ref 0.0–2.0)
BICARBONATE: 32.3 mmol/L — AB (ref 20.0–28.0)
FIO2: 32
O2 Saturation: 71.8 %
PCO2 VEN: 72 mmHg — AB (ref 44.0–60.0)
PH VEN: 7.26 (ref 7.250–7.430)
PO2 VEN: 44 mmHg (ref 32.0–45.0)
Patient temperature: 37

## 2016-10-06 LAB — POCT PREGNANCY, URINE: PREG TEST UR: NEGATIVE

## 2016-10-06 LAB — URINALYSIS COMPLETE WITH MICROSCOPIC (ARMC ONLY)
Bilirubin Urine: NEGATIVE
Ketones, ur: NEGATIVE mg/dL
NITRITE: NEGATIVE
PH: 8 (ref 5.0–8.0)
PROTEIN: 100 mg/dL — AB
SPECIFIC GRAVITY, URINE: 1.003 — AB (ref 1.005–1.030)

## 2016-10-06 LAB — URINE DRUG SCREEN, QUALITATIVE (ARMC ONLY)
Amphetamines, Ur Screen: NOT DETECTED
BARBITURATES, UR SCREEN: NOT DETECTED
Benzodiazepine, Ur Scrn: NOT DETECTED
CANNABINOID 50 NG, UR ~~LOC~~: NOT DETECTED
Cocaine Metabolite,Ur ~~LOC~~: POSITIVE — AB
MDMA (ECSTASY) UR SCREEN: NOT DETECTED
Methadone Scn, Ur: NOT DETECTED
Opiate, Ur Screen: NOT DETECTED
PHENCYCLIDINE (PCP) UR S: NOT DETECTED
Tricyclic, Ur Screen: NOT DETECTED

## 2016-10-06 LAB — ETHANOL

## 2016-10-06 LAB — GLUCOSE, CAPILLARY: Glucose-Capillary: 113 mg/dL — ABNORMAL HIGH (ref 65–99)

## 2016-10-06 LAB — MRSA PCR SCREENING: MRSA by PCR: NEGATIVE

## 2016-10-06 MED ORDER — HEPARIN SODIUM (PORCINE) 5000 UNIT/ML IJ SOLN: 5000.0000 [IU] | Freq: Three times a day (TID) | INTRAMUSCULAR | Status: DC

## 2016-10-06 MED ORDER — CINACALCET HCL 30 MG PO TABS
60.0000 mg | ORAL_TABLET | Freq: Every day | ORAL | Status: DC
  Administered 2016-10-07 – 2016-10-11 (×5): 60 mg via ORAL
  Filled 2016-10-06 (×5): qty 2

## 2016-10-06 MED ORDER — DEXTROSE 5 % IV SOLN
INTRAVENOUS | Status: DC
  Administered 2016-10-06: 21:00:00 via INTRAVENOUS

## 2016-10-06 MED ORDER — IPRATROPIUM-ALBUTEROL 0.5-2.5 (3) MG/3ML IN SOLN
3.0000 mL | Freq: Once | RESPIRATORY_TRACT | Status: AC
  Administered 2016-10-06: 3 mL via RESPIRATORY_TRACT
  Filled 2016-10-06: qty 3

## 2016-10-06 MED ORDER — IPRATROPIUM-ALBUTEROL 0.5-2.5 (3) MG/3ML IN SOLN
3.0000 mL | Freq: Four times a day (QID) | RESPIRATORY_TRACT | Status: DC
  Administered 2016-10-07 (×4): 3 mL via RESPIRATORY_TRACT
  Filled 2016-10-06 (×4): qty 3

## 2016-10-06 MED ORDER — RENA-VITE PO TABS
1.0000 | ORAL_TABLET | Freq: Every day | ORAL | Status: DC
  Administered 2016-10-06 – 2016-10-10 (×5): 1 via ORAL
  Filled 2016-10-06 (×5): qty 1

## 2016-10-06 MED ORDER — HYDROCODONE-ACETAMINOPHEN 10-325 MG PO TABS
1.0000 | ORAL_TABLET | Freq: Four times a day (QID) | ORAL | Status: DC | PRN
  Administered 2016-10-07 – 2016-10-08 (×4): 1 via ORAL
  Filled 2016-10-06 (×5): qty 1

## 2016-10-06 MED ORDER — HYDRALAZINE HCL 20 MG/ML IJ SOLN
10.0000 mg | Freq: Once | INTRAMUSCULAR | Status: AC
  Administered 2016-10-06: 10 mg via INTRAVENOUS
  Filled 2016-10-06: qty 1

## 2016-10-06 MED ORDER — FUROSEMIDE 40 MG PO TABS
40.0000 mg | ORAL_TABLET | ORAL | Status: DC
  Administered 2016-10-07 – 2016-10-11 (×3): 40 mg via ORAL
  Filled 2016-10-06 (×3): qty 1

## 2016-10-06 MED ORDER — ACETAMINOPHEN 650 MG RE SUPP: 650.0000 mg | Freq: Four times a day (QID) | RECTAL | Status: DC | PRN

## 2016-10-06 MED ORDER — IPRATROPIUM-ALBUTEROL 0.5-2.5 (3) MG/3ML IN SOLN
RESPIRATORY_TRACT | Status: AC
  Filled 2016-10-06: qty 9

## 2016-10-06 MED ORDER — AMLODIPINE BESYLATE 10 MG PO TABS
10.0000 mg | ORAL_TABLET | Freq: Every day | ORAL | Status: DC
  Administered 2016-10-06 – 2016-10-11 (×5): 10 mg via ORAL
  Filled 2016-10-06 (×5): qty 1

## 2016-10-06 MED ORDER — CALCIUM ACETATE (PHOS BINDER) 667 MG PO CAPS
1334.0000 mg | ORAL_CAPSULE | Freq: Three times a day (TID) | ORAL | Status: DC
  Administered 2016-10-07 – 2016-10-11 (×12): 1334 mg via ORAL
  Filled 2016-10-06 (×12): qty 2

## 2016-10-06 MED ORDER — CARVEDILOL 25 MG PO TABS
25.0000 mg | ORAL_TABLET | Freq: Two times a day (BID) | ORAL | Status: DC
  Administered 2016-10-07 – 2016-10-11 (×7): 25 mg via ORAL
  Filled 2016-10-06 (×8): qty 1

## 2016-10-06 MED ORDER — HYDROCODONE-ACETAMINOPHEN 10-325 MG PO TABS
1.0000 | ORAL_TABLET | Freq: Once | ORAL | Status: AC
  Administered 2016-10-06: 1 via ORAL
  Filled 2016-10-06: qty 1

## 2016-10-06 MED ORDER — PANTOPRAZOLE SODIUM 40 MG PO TBEC
40.0000 mg | DELAYED_RELEASE_TABLET | Freq: Two times a day (BID) | ORAL | Status: DC
  Administered 2016-10-06 – 2016-10-11 (×10): 40 mg via ORAL
  Filled 2016-10-06 (×10): qty 1

## 2016-10-06 MED ORDER — ENSURE ENLIVE PO LIQD
237.0000 mL | Freq: Three times a day (TID) | ORAL | Status: DC
  Administered 2016-10-07 – 2016-10-10 (×8): 237 mL via ORAL

## 2016-10-06 MED ORDER — ONDANSETRON HCL 4 MG/2ML IJ SOLN: 4.0000 mg | Freq: Four times a day (QID) | INTRAMUSCULAR | Status: DC | PRN

## 2016-10-06 MED ORDER — SERTRALINE HCL 50 MG PO TABS
50.0000 mg | ORAL_TABLET | Freq: Every day | ORAL | Status: DC
  Administered 2016-10-07 – 2016-10-11 (×5): 50 mg via ORAL
  Filled 2016-10-06 (×5): qty 1

## 2016-10-06 MED ORDER — BUDESONIDE 0.25 MG/2ML IN SUSP
0.2500 mg | Freq: Two times a day (BID) | RESPIRATORY_TRACT | Status: DC
  Administered 2016-10-06 – 2016-10-08 (×4): 0.25 mg via RESPIRATORY_TRACT
  Filled 2016-10-06 (×4): qty 2

## 2016-10-06 MED ORDER — HYDRALAZINE HCL 20 MG/ML IJ SOLN
10.0000 mg | INTRAMUSCULAR | Status: DC | PRN
  Administered 2016-10-11: 10 mg via INTRAVENOUS
  Filled 2016-10-06: qty 1

## 2016-10-06 MED ORDER — LOSARTAN POTASSIUM 50 MG PO TABS
100.0000 mg | ORAL_TABLET | Freq: Every day | ORAL | Status: DC
  Administered 2016-10-06 – 2016-10-11 (×4): 100 mg via ORAL
  Filled 2016-10-06 (×4): qty 2

## 2016-10-06 MED ORDER — SODIUM CHLORIDE 0.9 % IV SOLN: 250.0000 mL | INTRAVENOUS | Status: DC | PRN

## 2016-10-06 NOTE — H&P (Signed)
PULMONARY / CRITICAL CARE MEDICINE   Name: Michele Nash MRN: [id number] DOB: [birth year]    ADMISSION DATE:  10/06/2016   REFERRING MD:  Dr. Alfred Levins  CHIEF COMPLAINT: multiple falls with head injury  HISTORY OF PRESENT ILLNESS: This is a 60 year old African-American female with a past medical history of end-stage renal disease on dialysis Monday, Wednesdays and Fridays, obstructive sleep apnea on home O2 at 3 L, COPD, depression, and hypertension who presented to the ED with altered mental status following multiple falls at home. History is obtained from patient and ED records. Patient states that she was doing well until she was started her on Chantix for smoking cessation. She started having hallucinations about a week after starting the medication and then subsequently, over the last week she has had multiple falls, approximately 10-14 falls. The most dramatic fall where on 10/05/16 night in which he she hit her head and sustained multiple bruises along her left 3. and fall, head. Her mentation continued to be poor during the day hence her husband decided to bring her to the emergency room. Upon arrival in the ED, patient was very somnolent and had extensive bruising and swelling in her right forehead. No loss of consciousness during fall and falls were not associated with dizziness. However, she reports difficulty recalling current events. She is complaining of right hip and knee pain, headache and left leg pain as well. All her imaging studies are negative. Her blood gas showed severe hypercarbia and hence she was placed on BiPAP. Her urine toxicology was positive for cocaine, but otherwise unremarkable. She denies chest pain, palpitations, nausea and vomiting   PAST MEDICAL HISTORY :  She  has a past medical history of Anemia; Apnea, sleep; Arthritis; Bronchitis; Chronic kidney disease; COPD (chronic obstructive pulmonary disease) ([place]); Depression; Dialysis patient [facility]) (2014); GERD  (gastroesophageal reflux disease); Headache; Hyperlipidemia; Hypertension; Obesity; and Renal dialysis device, implant, or graft complication.  PAST SURGICAL HISTORY: She  has a past surgical history that includes Abdominal hysterectomy; Dialysis fistula creation; Colonoscopy (2011); Cardiac catheterization (N/A, 05/10/2015); Cardiac catheterization (Left, 05/10/2015); Cardiac catheterization (Left, 08/23/2015); Cardiac catheterization (N/A, 08/23/2015); Cardiac catheterization (08/23/2015); Colonoscopy with propofol (N/A, 09/20/2015); Revison of arteriovenous fistula (Left, 10/28/2015); Wound debridement (Left, 10/28/2015); Cardiac catheterization (N/A, 01/03/2016); Esophagogastroduodenoscopy (egd) with propofol (N/A, 06/02/2016); and Esophagogastroduodenoscopy (N/A, 07/13/2016).  Allergies  Allergen Reactions  . Sulfa Antibiotics Itching, Swelling, Rash and Other (See Comments)    Reaction:  Facial/body swelling     No current facility-administered medications on file prior to encounter.    Current Outpatient Prescriptions on File Prior to Encounter  Medication Sig  . albuterol (PROVENTIL HFA;VENTOLIN HFA) 108 (90 BASE) MCG/ACT inhaler Inhale 2 puffs into the lungs every 6 (six) hours as needed for wheezing or shortness of breath.  . ALPRAZolam (XANAX) 0.5 MG tablet Take 0.5 mg by mouth 2 (two) times daily.  Marland Kitchen amLODipine (NORVASC) 10 MG tablet Take 10 mg by mouth daily.   . calcium acetate (PHOSLO) 667 MG capsule Take 1,334 mg by mouth 3 (three) times daily.   . calcium carbonate (OS-CAL) 600 MG TABS tablet Take 600 mg by mouth 2 (two) times daily with a meal.  . carvedilol (COREG) 25 MG tablet Take 1 tablet (25 mg total) by mouth 2 (two) times daily with a meal.  . cinacalcet (SENSIPAR) 30 MG tablet Take 60 mg by mouth daily with breakfast.   . ezetimibe (ZETIA) 10 MG tablet Take 10 mg by mouth daily.   Marland Kitchen  fluticasone (FLONASE) 50 MCG/ACT nasal spray Place 2 sprays into both nostrils daily as needed for  rhinitis.  . Fluticasone-Salmeterol (ADVAIR) 100-50 MCG/DOSE AEPB Inhale 1 puff into the lungs 2 (two) times daily.  . Fluticasone-Salmeterol (ADVAIR) 250-50 MCG/DOSE AEPB Inhale 1 puff into the lungs 2 (two) times daily.  . furosemide (LASIX) 40 MG tablet Take 40 mg by mouth every Tuesday, Thursday, Saturday, and Sunday.   . lidocaine-prilocaine (EMLA) cream Apply 1 application topically as needed (prior to accessing port).  . losartan (COZAAR) 100 MG tablet Take 100 mg by mouth daily.   . multivitamin (RENA-VIT) TABS tablet Take 1 tablet by mouth at bedtime.   Marland Kitchen omeprazole (PRILOSEC) 20 MG capsule Take 20 mg by mouth daily.  . pantoprazole (PROTONIX) 40 MG tablet Take 1 tablet (40 mg total) by mouth 2 (two) times daily.  . sertraline (ZOLOFT) 50 MG tablet Take 50 mg by mouth daily.     FAMILY HISTORY:  Her indicated that her mother is deceased. She indicated that her father is deceased. She indicated that her sister is deceased.    SOCIAL HISTORY: She  reports that she quit smoking about 22 months ago. Her smoking use included Cigarettes. She has a 20.00 pack-year smoking history. She has never used smokeless tobacco. She reports that she does not drink alcohol or use drugs.  REVIEW OF SYSTEMS:   Constitutional: Negative for fever and chills.  HENT: Negative for congestion and rhinorrhea.  Eyes: Negative for redness and visual disturbance.  Respiratory: Positive for for shortness of breath and occasional wheezing.  Cardiovascular: Negative for chest pain and palpitations.  Gastrointestinal: Negative  for nausea , vomiting and abdominal pain and  Loose stools Genitourinary: Negative for dysuria and urgency.  Endocrine: Denies polyuria, polyphagia and heat intolerance Musculoskeletal: Positive for myalgias and arthralgias.  Skin: Negative for pallor and wound.  Neurological: Negative for dizziness , but positive for headaches   SUBJECTIVE:   VITAL SIGNS: BP (!) 167/85   Pulse 90    Temp 99.2 F (37.3 C) (Oral)   Resp 16   Ht 5\' 5"  (1.651 m)   Wt 136 lb (61.7 kg)   SpO2 98%   BMI 22.63 kg/m   HEMODYNAMICS:    VENTILATOR SETTINGS:    INTAKE / OUTPUT: No intake/output data recorded.  PHYSICAL EXAMINATION: General: Appears older for age, in moderate distress when aroused, extensive bruising in right forehead Neuro:  Alert to person and place, somnolent, speech is garbled, follows commands, grip strength, -4/5 in right upper extremity, -3/5 in RUE, 4/5 in bilateral lower extremities, no facial droop, gag reflex intact HEENT: Subcutaneous hematoma in the right for right and eyelids, extensive bruising, left eye with scleral hemorrhage, pupils sluggish but equal, trachea midline, no thyromegaly and no JVD Cardiovascular: Rate and rhythm regular, S1, S2 audible, no murmur, regurg or gallop Lungs: Scattered rhonchi in anterior lung fields, work of breathing is normal, bilateral airflow in all lung fields, diminished in the bases, no wheezes or rales Abdomen: Nondistended, no, no scars, positive bowel sounds in all 4., palpation reveals no organomegaly Musculoskeletal:  No visible deformities, pain with flexion and extension of the right hip, pain with palpation. No focal rib cage, unable to assess gait Skin: And dry with extensive bruising in upper back along right mid axillary line in the right part of the face  LABS:  BMET  Recent Labs Lab 10/06/16 1428  NA 139  K 3.7  CL 100*  CO2 30  BUN 29*  CREATININE 6.47*  GLUCOSE 177*    Electrolytes  Recent Labs Lab 10/06/16 1428  CALCIUM 8.4*    CBC  Recent Labs Lab 10/06/16 1428  WBC 7.9  HGB 12.3  HCT 40.0  PLT 288    Coag's No results for input(s): APTT, INR in the last 168 hours.  Sepsis Markers No results for input(s): LATICACIDVEN, PROCALCITON, O2SATVEN in the last 168 hours.  ABG No results for input(s): PHART, PCO2ART, PO2ART in the last 168 hours.  Liver Enzymes  Recent  Labs Lab 10/06/16 1428  AST 33  ALT 26  ALKPHOS 151*  BILITOT 0.7  ALBUMIN 3.9    Cardiac Enzymes No results for input(s): TROPONINI, PROBNP in the last 168 hours.  Glucose No results for input(s): GLUCAP in the last 168 hours.  Imaging Dg Pelvis 1-2 Views  Result Date: 10/06/2016 CLINICAL DATA:  Multiple falls.  Right hip pain. EXAM: PELVIS - 1-2 VIEW COMPARISON:  None. FINDINGS: There is no evidence of pelvic fracture or diastasis. No pelvic bone lesions are seen. IMPRESSION: 1. No evidence for hip fracture or dislocation. If there is high clinical suspicion for occult fracture or the patient refuses to weightbear, consider further evaluation with MRI. Although CT is expeditious, evidence is lacking regarding accuracy of CT over plain film radiography. Electronically Signed   By: Kerby Moors M.D.   On: 10/06/2016 16:23   Ct Head Wo Contrast  Result Date: 10/06/2016 CLINICAL DATA:  Pain after multiple falls EXAM: CT HEAD WITHOUT CONTRAST TECHNIQUE: Contiguous axial images were obtained from the base of the skull through the vertex without intravenous contrast. COMPARISON:  None. FINDINGS: Brain: No subdural, epidural, or subarachnoid hemorrhage. Calcifications seen in the left basal ganglia of no acute significance. No acute cortical ischemia or infarct. The cerebellum and brainstem are normal. Basal cisterns are patent. Ventricles and sulci are normal for age. Vascular: Calcified atherosclerosis in the intracranial carotid arteries. Skull: Normal. Negative for fracture or focal lesion. Sinuses/Orbits: The paranasal sinuses and left mastoid air cells are normal. The middle ears are well aerated. There is fluid in scattered right mastoid air cells with no overlying soft tissue abnormalities or bony erosion. Other: Significance scalp thickening diffusely, centered to the right. High attenuation within the scout thickening is consistent with acute blood products. Visualized portions of the  globes are. Soft tissues otherwise normal. IMPRESSION: 1. Marked scalp edema and hematoma consistent with history. No acute intracranial abnormality identified. Electronically Signed   By: Dorise Bullion III M.D   On: 10/06/2016 13:35   Mr Brain Wo Contrast  Result Date: 10/06/2016 CLINICAL DATA:  Multiple falls.  Hallucinations. EXAM: MRI HEAD WITHOUT CONTRAST TECHNIQUE: Multiplanar, multiecho pulse sequences of the brain and surrounding structures were obtained without intravenous contrast. COMPARISON:  Head CT same day FINDINGS: The study suffers from considerable motion degradation. Brain: Diffusion imaging does not show any acute or subacute infarction. The brainstem and cerebellum are normal. Cerebral hemispheres show abnormal T2 and FLAIR signal in the deep white matter most consistent with chronic small vessel disease. No cortical or large vessel territory infarction. No mass lesion. No sign of intracranial hemorrhage. No hydrocephalus or extra-axial collection. Vascular: Major vessels at the base of the brain show flow. Skull and upper cervical spine: Somewhat heterogeneous marrow pattern. This may be due to chronic renal disease. Sinuses/Orbits: Sinuses are negative. There is mastoid effusion on the right. Other: Profound diffuse scalp swelling presumably post traumatic. IMPRESSION: No acute intracranial finding.  Chronic small-vessel ischemic change of the deep white matter. Diffuse soft tissue swelling of the scalp, presumably post traumatic. Heterogeneous marrow pattern of the calvarium, presumed secondary to chronic renal disease. Electronically Signed   By: Nelson Chimes M.D.   On: 10/06/2016 15:54   Dg Chest Portable 1 View  Result Date: 10/06/2016 CLINICAL DATA:  Patient presents to the ED with multiple falls. Patient states, "I fell 14 times today." Patient is complaining of right knee pain and right hip pain. Pt asleep during all x-ray exams; pt poor historian at time of x-rays. Hx/o  COPD. Dialysis pt. EXAM: PORTABLE CHEST 1 VIEW COMPARISON:  09/15/2016 FINDINGS: Mild enlarged cardiac silhouette. There is mild diffuse interstitial pattern. No focal infiltrate. No pneumothorax. No evidence of thoracic trauma. IMPRESSION: 1. No evidence of thoracic trauma. 2. Mild interstitial edema pattern Electronically Signed   By: Suzy Bouchard M.D.   On: 10/06/2016 16:23   Dg Knee Complete 4 Views Left  Result Date: 10/06/2016 CLINICAL DATA:  Multiple falls. EXAM: LEFT KNEE - COMPLETE 4+ VIEW COMPARISON:  None. FINDINGS: No evidence of fracture, dislocation, or joint effusion. No evidence of arthropathy or other focal bone abnormality. Soft tissues are unremarkable. IMPRESSION: No fracture or dislocation. Electronically Signed   By: Suzy Bouchard M.D.   On: 10/06/2016 16:20   Dg Knee Complete 4 Views Right  Result Date: 10/06/2016 CLINICAL DATA:  Patient presents to the ED with multiple falls. Patient states, "I fell 14 times today." Patient is complaining of right knee pain and right hip pain. Pt asleep during all x-ray exams; pt poor historian at time of x-rays. Hx/o COPD. Dialysis pt. EXAM: RIGHT KNEE - COMPLETE 4+ VIEW COMPARISON:  None. FINDINGS: No fracture of the proximal tibia or distal femur. Patella is normal. No joint effusion. IMPRESSION: No fracture or dislocation. Electronically Signed   By: Suzy Bouchard M.D.   On: 10/06/2016 16:22     STUDIES:  None  CULTURES: None  ANTIBIOTICS: None  SIGNIFICANT EVENTS: Started on Chantix 2 weeks ago with subsequent onset of hallucination and falls. 09/05/2016: Presented to the emergency room following multiple falls at home with head trauma; admitted with acute respiratory failure, head trauma and altered mental status  LINES/TUBES: None  DISCUSSION: 60 year old African-American female with end-stage renal disease on dialysis, and substance abuse, (cocaine), presenting with acute on chronic hypercarbic respiratory  failure, hypertensive crisis, head trauma secondary to traumatic fall, and altered mental status  ASSESSMENT / PLAN:  PULMONARY A: Acute on chronic hypercarbic respiratory failure. COPD obstructive sleep apnea on home O2 P:   Nebulized bronchodilators and steroids. BiPAP and titrate settings to maintain optimal tidal volumes in SPO2 90-94. Chest x-ray when necessary  CARDIOVASCULAR A:  Hypertensive crisis-systolic blood pressure ranging between 190-220 mmhg Hyperlipidemia P:  Hemodynamics per ICU protocol Hydralazine 10 mg IV 1 now and every 4 hours as needed to maintain systolic blood pressure between 140 and 150 mmHg Resume all home blood pressure medications  RENAL A:   End-stage renal disease on dialysis P:   Nephrology consulted for inpatient hemodialysis Monitor and correct electrolytes  GASTROINTESTINAL A:    history of GERD P:    continue home dose of PPI  NEUROLOGIC A:   right forehead hematoma secondary to falls Multiple falls Acute pain due to multiple bruised ribs P:   Monitor neurologic status. Maintain on fall precautions Norco as needed for pain management   DISPOSITION AND FAMILY UPDATE: Husband. Updated at bedside. Maintain the  ICU overnight.    total critical care time equals 45 minutes  Magdalene S. [facility] ANP-BC Pulmonary and Critical Care Medicine [facility] Pager [phone] or [phone] 10/06/2016, 8:45 PM   Attending note - see progress note today for attestation.

## 2016-10-06 NOTE — ED Triage Notes (Signed)
Patient presents to the ED with multiple falls.  Patient states, "I fell 14 times today."  During triage, sitting in the wheelchair patient intermittently leans far forward.  When this Rn asked patient about this behavior she stated, "I don't know why I do it.  I don't even know I'm doing it."  Patient is complaining of right knee pain, right hip pain, and head pain.  Patient has multiple bruises to her forehead.  Patient states she has been hallucinating since she started taking medication to stop smoking x 1 week.

## 2016-10-06 NOTE — ED Notes (Signed)
Patient unsteady in chair, noted to bob around in chair, wppears to be falling asleep, when questioned voice is clear and answers appropriate.

## 2016-10-06 NOTE — ED Notes (Signed)
Patient transported to MRI and then XR

## 2016-10-06 NOTE — ED Provider Notes (Signed)
[facility] Emergency Department Provider Note  ____________________________________________  Time seen: Approximately 3:50 PM  I have reviewed the triage vital signs and the nursing notes.   HISTORY  Chief Complaint Fall   HPI Michele Nash is a 60 y.o. female history of ESRD on HD (MWF), last HD yesterday, OSA, no CPAP, COPD, hypertension, hyperlipidemia who presents for evaluation of multiple falls and confusion. According to the husband patient has been on Chantix for the last 2 weeks. She is trying to quit smoking. She has been having intermittent visual hallucinations which is new for her for the last week. Yesterday patient had anywhere between 10-14 falls of unclear etiology. Patient reports that she didn't feel dizzy before any of the falls or had any chest pain/palpitations. She reports that she just kept falling. She also reports that her memory has been horrible and she does not know why. Husband came home from work at 9 PM and noticed that his wife was falling asleep while talking to him and just not behaving like herself. Around 2 AM she went to the bathroom and had another fall hitting her head onto the tile. No LOC according to husband who was next to the patient. Patient develop a very big right forehead hematoma. This morning her husband tried to convince her to come to the emergency room but she wouldn't. Patient continues to be very somnolent and falling asleep throughout the day which prompted her husband bring her to the emergency room.  Past Medical History:  Diagnosis Date  . Anemia   . Apnea, sleep    for sleep study 10/17/15-no cpap yet  . Arthritis   . Bronchitis   . Chronic kidney disease   . COPD (chronic obstructive pulmonary disease) ([place])   . Depression   . Dialysis patient ([place]) 2014  . GERD (gastroesophageal reflux disease)   . Headache   . Hyperlipidemia   . Hypertension   . Obesity   . Renal dialysis device, implant, or  graft complication    RIGHT CHEST CATH    Patient Active Problem List   Diagnosis Date Noted  . Pulmonary edema 09/14/2016  . Respiratory failure ([place]) 07/23/2016  . GIB (gastrointestinal bleeding) 07/09/2016  . Protein-calorie malnutrition, severe 05/29/2016  . Hyperglycemia 05/28/2016  . Pain in limb 10/28/2015  . End stage renal disease ([place]) 10/28/2015  . Essential hypertension 10/28/2015  . ESRD on hemodialysis ([place]) 03/29/2015  . HTN (hypertension) 03/29/2015  . COPD (chronic obstructive pulmonary disease) ([place]) 03/29/2015  . GAD (generalized anxiety disorder) 03/29/2015    Past Surgical History:  Procedure Laterality Date  . ABDOMINAL HYSTERECTOMY    . COLONOSCOPY  2011  . COLONOSCOPY WITH PROPOFOL N/A 09/20/2015   Procedure: COLONOSCOPY WITH PROPOFOL;  Surgeon: Lucilla Lame, MD;  Location: [facility] ENDOSCOPY;  Service: Endoscopy;  Laterality: N/A;  . DIALYSIS FISTULA CREATION    . ESOPHAGOGASTRODUODENOSCOPY N/A 07/13/2016   Procedure: ESOPHAGOGASTRODUODENOSCOPY (EGD);  Surgeon: Lollie Sails, MD;  Location: [facility] ENDOSCOPY;  Service: Endoscopy;  Laterality: N/A;  . ESOPHAGOGASTRODUODENOSCOPY (EGD) WITH PROPOFOL N/A 06/02/2016   Procedure: ESOPHAGOGASTRODUODENOSCOPY (EGD) WITH PROPOFOL;  Surgeon: Manya Silvas, MD;  Location: [facility] ENDOSCOPY;  Service: Endoscopy;  Laterality: N/A;  . PERIPHERAL VASCULAR CATHETERIZATION N/A 05/10/2015   Procedure: A/V Shuntogram/Fistulagram;  Surgeon: Katha Cabal, MD;  Location: [place] CV LAB;  Service: Cardiovascular;  Laterality: N/A;  . PERIPHERAL VASCULAR CATHETERIZATION Left 05/10/2015   Procedure: A/V Shunt Intervention;  Surgeon: Dolores Lory  Schnier, MD;  Location: [place] CV LAB;  Service: Cardiovascular;  Laterality: Left;  . PERIPHERAL VASCULAR CATHETERIZATION Left 08/23/2015   Procedure: A/V Shuntogram/Fistulagram;  Surgeon: Katha Cabal, MD;  Location: [place] CV LAB;  Service: Cardiovascular;  Laterality:  Left;  . PERIPHERAL VASCULAR CATHETERIZATION N/A 08/23/2015   Procedure: A/V Shunt Intervention;  Surgeon: Katha Cabal, MD;  Location: [place] CV LAB;  Service: Cardiovascular;  Laterality: N/A;  . PERIPHERAL VASCULAR CATHETERIZATION  08/23/2015   Procedure: Dialysis/Perma Catheter Insertion;  Surgeon: Katha Cabal, MD;  Location: [place] CV LAB;  Service: Cardiovascular;;  . PERIPHERAL VASCULAR CATHETERIZATION N/A 01/03/2016   Procedure: Dialysis/Perma Catheter Removal;  Surgeon: Katha Cabal, MD;  Location: [place] CV LAB;  Service: Cardiovascular;  Laterality: N/A;  . REVISON OF ARTERIOVENOUS FISTULA Left 10/28/2015   Procedure: REVISON OF ARTERIOVENOUS FISTULA WITH ARTEGRAFT;  Surgeon: Katha Cabal, MD;  Location: [facility] ORS;  Service: Vascular;  Laterality: Left;  . WOUND DEBRIDEMENT Left 10/28/2015   Procedure: Resection of shoulder cyst ( left );  Surgeon: Katha Cabal, MD;  Location: [facility] ORS;  Service: Vascular;  Laterality: Left;    Prior to Admission medications   Medication Sig Start Date End Date Taking? Authorizing Provider  albuterol (PROVENTIL HFA;VENTOLIN HFA) 108 (90 BASE) MCG/ACT inhaler Inhale 2 puffs into the lungs every 6 (six) hours as needed for wheezing or shortness of breath.    Historical Provider, MD  ALPRAZolam Duanne Moron) 0.5 MG tablet Take 0.5 mg by mouth 2 (two) times daily.    Historical Provider, MD  amLODipine (NORVASC) 10 MG tablet Take 10 mg by mouth daily.     Historical Provider, MD  calcium acetate (PHOSLO) 667 MG capsule Take 1,334 mg by mouth 3 (three) times daily.     Historical Provider, MD  calcium carbonate (OS-CAL) 600 MG TABS tablet Take 600 mg by mouth 2 (two) times daily with a meal.    Historical Provider, MD  carvedilol (COREG) 25 MG tablet Take 1 tablet (25 mg total) by mouth 2 (two) times daily with a meal. 04/26/16   Bettey Costa, MD  cinacalcet (SENSIPAR) 30 MG tablet Take 60 mg by mouth daily with breakfast.      Historical Provider, MD  ezetimibe (ZETIA) 10 MG tablet Take 10 mg by mouth daily.     Historical Provider, MD  fluticasone (FLONASE) 50 MCG/ACT nasal spray Place 2 sprays into both nostrils daily as needed for rhinitis.    Historical Provider, MD  Fluticasone-Salmeterol (ADVAIR) 100-50 MCG/DOSE AEPB Inhale 1 puff into the lungs 2 (two) times daily.    Historical Provider, MD  Fluticasone-Salmeterol (ADVAIR) 250-50 MCG/DOSE AEPB Inhale 1 puff into the lungs 2 (two) times daily.    Historical Provider, MD  furosemide (LASIX) 40 MG tablet Take 40 mg by mouth every Tuesday, Thursday, Saturday, and Sunday.     Historical Provider, MD  lidocaine-prilocaine (EMLA) cream Apply 1 application topically as needed (prior to accessing port).    Historical Provider, MD  losartan (COZAAR) 100 MG tablet Take 100 mg by mouth daily.     Historical Provider, MD  multivitamin (RENA-VIT) TABS tablet Take 1 tablet by mouth at bedtime.     Historical Provider, MD  omeprazole (PRILOSEC) 20 MG capsule Take 20 mg by mouth daily.    Historical Provider, MD  pantoprazole (PROTONIX) 40 MG tablet Take 1 tablet (40 mg total) by mouth 2 (two) times daily. Patient not taking: Reported on  09/14/2016 06/02/16   Demetrios Loll, MD  sertraline (ZOLOFT) 50 MG tablet Take 50 mg by mouth daily.     Historical Provider, MD    Allergies Sulfa antibiotics  Family History  Problem Relation Age of Onset  . Heart disease Mother   . Cancer Father   . Cancer Sister     Social History Social History  Substance Use Topics  . Smoking status: Former Smoker    Packs/day: 0.50    Years: 40.00    Types: Cigarettes    Quit date: 11/23/2014  . Smokeless tobacco: Never Used  . Alcohol use No    Review of Systems  Constitutional: Negative for fever. + somnolence and AH Eyes: Negative for visual changes. ENT: Negative for facial injury or neck injury Cardiovascular: Negative for chest injury. Respiratory: Negative for shortness of breath.  Negative for chest wall injury. Gastrointestinal: Negative for abdominal pain or injury. Genitourinary: Negative for dysuria. Musculoskeletal: Negative for back injury, negative for arm or leg pain. Skin: Negative for laceration/abrasions. Neurological: + head injury.   ____________________________________________   PHYSICAL EXAM:  VITAL SIGNS: ED Triage Vitals  Enc Vitals Group     BP 10/06/16 1238 (!) 187/92     Pulse Rate 10/06/16 1238 89     Resp 10/06/16 1238 16     Temp 10/06/16 1238 99.2 F (37.3 C)     Temp Source 10/06/16 1238 Oral     SpO2 10/06/16 1238 99 %     Weight 10/06/16 1239 136 lb (61.7 kg)     Height 10/06/16 1239 5\' 5"  (1.651 m)     Head Circumference --      Peak Flow --      Pain Score 10/06/16 1238 9     Pain Loc --      Pain Edu? --      Excl. in [place]? --     Constitutional: Patient is sleepy but will wake up and answer to questions. She'll then follow sleep and starts snoring.  HEENT Head: Normocephalic, large forehead hematoma. Face: No facial bony tenderness. Stable midface Ears: No hemotympanum bilaterally. No Battle sign Eyes: No eye injury. PERRL. No raccoon eyes Nose: Nontender. No epistaxis. No rhinorrhea Mouth/Throat: Mucous membranes are moist. No oropharyngeal blood. No dental injury. Airway patent without stridor. Normal voice. Neck: no C-collar in place. No midline c-spine tenderness.  Cardiovascular: Normal rate, regular rhythm. Normal and symmetric distal pulses are present in all extremities. Pulmonary/Chest: Chest wall is stable and nontender to palpation/compression. Normal respiratory effort. Breath sounds are normal. No crepitus.  Abdominal: Soft, nontender, non distended. Musculoskeletal: Nontender with normal full range of motion in all extremities. No deformities. No thoracic or lumbar midline spinal tenderness. Pelvis is stable. Skin: Skin is warm, dry and intact. No abrasions or contutions. Neurological: Slurred speech.  Moves all extremities to command. No gross focal neurologic deficits are appreciated.  Glascow Coma Score: 3 - Opens eyes to loud noise or command 6 - Follows simple motor commands 4 - Seems confused, disoriented GCS: 13  ____________________________________________   LABS (all labs ordered are listed, but only abnormal results are displayed)  Labs Reviewed  BLOOD GAS, VENOUS - Abnormal; Notable for the following:       Result Value   pCO2, Ven 72 (*)    Bicarbonate 32.3 (*)    Acid-Base Excess 3.2 (*)    All other components within normal limits  CBC WITH DIFFERENTIAL/PLATELET - Abnormal; Notable for the following:  MCHC 30.9 (*)    RDW 17.7 (*)    Monocytes Absolute 1.1 (*)    All other components within normal limits  COMPREHENSIVE METABOLIC PANEL - Abnormal; Notable for the following:    Chloride 100 (*)    Glucose, Bld 177 (*)    BUN 29 (*)    Creatinine, Ser 6.47 (*)    Calcium 8.4 (*)    Alkaline Phosphatase 151 (*)    GFR calc non Af Amer 6 (*)    GFR calc Af Amer 7 (*)    All other components within normal limits  URINALYSIS COMPLETEWITH MICROSCOPIC ([facility] ONLY) - Abnormal; Notable for the following:    Color, Urine STRAW (*)    APPearance CLEAR (*)    Glucose, UA >500 (*)    Specific Gravity, Urine 1.003 (*)    Hgb urine dipstick 1+ (*)    Protein, ur 100 (*)    Leukocytes, UA 1+ (*)    Bacteria, UA RARE (*)    Squamous Epithelial / LPF 0-5 (*)    All other components within normal limits  URINE DRUG SCREEN, QUALITATIVE ([facility] ONLY) - Abnormal; Notable for the following:    Cocaine Metabolite,Ur [place] POSITIVE (*)    All other components within normal limits  BLOOD GAS, VENOUS - Abnormal; Notable for the following:    pCO2, Ven 76 (*)    Bicarbonate 34.1 (*)    Acid-Base Excess 4.7 (*)    All other components within normal limits  URINE CULTURE  ETHANOL    ____________________________________________  EKG  none ____________________________________________  RADIOLOGY  Head CT: Marked scalp edema and hematoma consistent with history. No acute intracranial abnormality identified   MRI head:  No acute intracranial finding. Chronic small-vessel ischemic change of the deep white matter.  Diffuse soft tissue swelling of the scalp, presumably post traumatic.  Heterogeneous marrow pattern of the calvarium, presumed secondary to chronic renal disease. ____________________________________________   PROCEDURES  Procedure(s) performed: None Procedures Critical Care performed: yes  CRITICAL CARE Performed by: Rudene Re  ?  Total critical care time: 40 min  Critical care time was exclusive of separately billable procedures and treating other patients.  Critical care was necessary to treat or prevent imminent or life-threatening deterioration.  Critical care was time spent personally by me on the following activities: development of treatment plan with patient and/or surrogate as well as nursing, discussions with consultants, evaluation of patient's response to treatment, examination of patient, obtaining history from patient or surrogate, ordering and performing treatments and interventions, ordering and review of laboratory studies, ordering and review of radiographic studies, pulse oximetry and re-evaluation of patient's condition.  ____________________________________________   INITIAL IMPRESSION / ASSESSMENT AND PLAN / ED COURSE  60 y.o. female history of ESRD on HD (MWF), last HD yesterday, OSA, no CPAP, COPD, hypertension, hyperlipidemia who presents for evaluation of multiple falls and confusion since yesterday. She was a very large forehead hematoma, patient is very sleepy but will arouse and answer to questions, she is grossly neurologically intact with no evidence of basilar skull fx. Head CT and MRI were no acute  findings. Patient's blood pressure initially extremely elevated she was given hydralazine for concerns of press. VBG showing a pH of 7.26 with a PCO2 of 72 which could be contributing to her mental status however patient does not seem to have a COPD exacerbation as she is moving great air and not wheezing. Tox screen pending. Alcohol is negative.  Clinical Course as  of Oct 06 1805  Sat Oct 06, 2016  1753 The patient continues somnolent with no improvement of her CBG on BiPAP. CT had an MRI with no acute findings. Drug screen positive for cocaine but no opiates for benzos. Remaining of her blood work its within her normal limits. I have discussed patient with the hospitalist who recommended consultation with the intensivist. intensivist has been paged at this time  [CV]    Clinical Course User Index [CV] Rudene Re, MD    Pertinent labs & imaging results that were available during my care of the patient were reviewed by me and considered in my medical decision making (see chart for details).    ____________________________________________   FINAL CLINICAL IMPRESSION(S) / ED DIAGNOSES  Final diagnoses:  Acute respiratory failure with hypercapnia (HCC)  Altered mental status, unspecified altered mental status type  Fall, sequela  Traumatic hematoma of forehead, initial encounter      NEW MEDICATIONS STARTED DURING THIS VISIT:  New Prescriptions   No medications on file     Note:  This document was prepared using Dragon voice recognition software and may include unintentional dictation errors.    Rudene Re, MD 10/06/16 [phone]

## 2016-10-07 ENCOUNTER — Inpatient Hospital Stay: Payer: Medicare Other

## 2016-10-07 DIAGNOSIS — R400 Somnolence: Secondary | ICD-10-CM

## 2016-10-07 DIAGNOSIS — R4182 Altered mental status, unspecified: Secondary | ICD-10-CM

## 2016-10-07 DIAGNOSIS — J9602 Acute respiratory failure with hypercapnia: Secondary | ICD-10-CM

## 2016-10-07 DIAGNOSIS — N186 End stage renal disease: Secondary | ICD-10-CM

## 2016-10-07 DIAGNOSIS — S0083XA Contusion of other part of head, initial encounter: Secondary | ICD-10-CM

## 2016-10-07 DIAGNOSIS — W19XXXA Unspecified fall, initial encounter: Secondary | ICD-10-CM

## 2016-10-07 DIAGNOSIS — Z992 Dependence on renal dialysis: Secondary | ICD-10-CM

## 2016-10-07 LAB — CBC
HCT: 38.2 % (ref 35.0–47.0)
Hemoglobin: 11.8 g/dL — ABNORMAL LOW (ref 12.0–16.0)
MCH: 30.3 pg (ref 26.0–34.0)
MCHC: 30.8 g/dL — AB (ref 32.0–36.0)
MCV: 98.3 fL (ref 80.0–100.0)
PLATELETS: 301 10*3/uL (ref 150–440)
RBC: 3.89 MIL/uL (ref 3.80–5.20)
RDW: 18.3 % — ABNORMAL HIGH (ref 11.5–14.5)
WBC: 9.9 10*3/uL (ref 3.6–11.0)

## 2016-10-07 LAB — BLOOD GAS, ARTERIAL
ACID-BASE EXCESS: 5.3 mmol/L — AB (ref 0.0–2.0)
BICARBONATE: 33 mmol/L — AB (ref 20.0–28.0)
DELIVERY SYSTEMS: POSITIVE
Expiratory PAP: 5
FIO2: 0.28
Inspiratory PAP: 12
O2 SAT: 94.9 %
PATIENT TEMPERATURE: 37
PH ART: 7.32 — AB (ref 7.350–7.450)
pCO2 arterial: 64 mmHg — ABNORMAL HIGH (ref 32.0–48.0)
pO2, Arterial: 81 mmHg — ABNORMAL LOW (ref 83.0–108.0)

## 2016-10-07 LAB — BASIC METABOLIC PANEL
Anion gap: 13 (ref 5–15)
BUN: 42 mg/dL — AB (ref 6–20)
CHLORIDE: 97 mmol/L — AB (ref 101–111)
CO2: 28 mmol/L (ref 22–32)
CREATININE: 8.26 mg/dL — AB (ref 0.44–1.00)
Calcium: 8.4 mg/dL — ABNORMAL LOW (ref 8.9–10.3)
GFR calc Af Amer: 5 mL/min — ABNORMAL LOW (ref >60)
GFR, EST NON AFRICAN AMERICAN: 5 mL/min — AB (ref >60)
GLUCOSE: 150 mg/dL — AB (ref 65–99)
POTASSIUM: 4.1 mmol/L (ref 3.5–5.1)
SODIUM: 138 mmol/L (ref 135–145)

## 2016-10-07 LAB — MAGNESIUM: MAGNESIUM: 1.8 mg/dL (ref 1.7–2.4)

## 2016-10-07 LAB — AMMONIA: Ammonia: 36 umol/L — ABNORMAL HIGH (ref 9–35)

## 2016-10-07 LAB — PHOSPHORUS: Phosphorus: 5.9 mg/dL — ABNORMAL HIGH (ref 2.5–4.6)

## 2016-10-07 MED ORDER — IPRATROPIUM-ALBUTEROL 0.5-2.5 (3) MG/3ML IN SOLN
3.0000 mL | Freq: Three times a day (TID) | RESPIRATORY_TRACT | Status: DC
  Administered 2016-10-08 – 2016-10-11 (×8): 3 mL via RESPIRATORY_TRACT
  Filled 2016-10-07 (×11): qty 3

## 2016-10-07 NOTE — Progress Notes (Signed)
Pt transfer to room 207

## 2016-10-07 NOTE — Progress Notes (Signed)
Central monitoring called to report pt dropped to 70s. RT in room with patient giving treatment. Does drop to 70s on RA. After treatment will be back on 3l [place]. NP called earlier to say that pt order to bipap will be changed to HS.

## 2016-10-07 NOTE — Plan of Care (Signed)
Problem: Safety: Goal: Ability to remain free from injury will improve Outcome: Progressing Pt free from falls, call bell within reach.  Problem: Pain Managment: Goal: General experience of comfort will improve Outcome: Not Progressing Pt with c/o pain and guarding left arm/hand, norco given prn. Will cont to monitor, pt in bed with eyes closed.   Problem: Physical Regulation: Goal: Will remain free from infection Outcome: Progressing Pt remains afebrile, will cont to monitor.   Problem: Skin Integrity: Goal: Risk for impaired skin integrity will decrease Outcome: Progressing Wounds intact.  Problem: Nutrition: Goal: Adequate nutrition will be maintained Outcome: Progressing Pt tolerated dinner tray.  Problem: ICU Phase Progression Outcomes Goal: O2 sats trending toward baseline Outcome: Progressing O2 sat 95% on 3L [place], pt rested on bipap overnight. Bil lower lobes diminished, barely air moving.

## 2016-10-07 NOTE — Progress Notes (Signed)
Notified Dr. Vianne Bulls of pt c/o sudden, sharp, intense pain on the top of head that pt states comes on and then gradually declines, and then returns. Also notified Dr that pt's R eye swelling had increased and that L eye had now also swollen shut. Dr acknowledged, orders received

## 2016-10-07 NOTE — Progress Notes (Signed)
Patient tx CCu here with AMS from [facility] multiple falls  Needs observation for falls head CT negative for ICH. chronc hypercarbia.  Will see patient tomorrow

## 2016-10-07 NOTE — Progress Notes (Signed)
Subjective:  Patient admitted for multiple falls. She has a very large right periorbital ecchymosis and significant bruising on the right side of her head. She has bruising of her left eye but to a lesser degree. She states that she fell approximately 14 times at home. She has recently been having hallucinations. She was recently restarted on Chantix. Urine toxicology was also positive for cocaine.   Objective:  Vital signs in last 24 hours:  Temp:  [97.6 F (36.4 C)-99.4 F (37.4 C)] 98.5 F (36.9 C) (11/12 1114) Pulse Rate:  [29-99] 67 (11/12 1114) Resp:  [12-25] 12 (11/12 1114) BP: (116-175)/(65-120) 124/68 (11/12 1114) SpO2:  [77 %-100 %] 98 % (11/12 1335) FiO2 (%):  [28 %] 28 % (11/12 0610) Weight:  [57.3 kg (126 lb 5.2 oz)-59.9 kg (132 lb 0.9 oz)] 57.3 kg (126 lb 5.2 oz) (11/12 0620)  Weight change:  Filed Weights   10/06/16 1239 10/06/16 2200 10/07/16 0620  Weight: 61.7 kg (136 lb) 59.9 kg (132 lb 0.9 oz) 57.3 kg (126 lb 5.2 oz)    Intake/Output:    Intake/Output Summary (Last 24 hours) at 10/07/16 1402 Last data filed at 10/07/16 1200  Gross per 24 hour  Intake           676.58 ml  Output              200 ml  Net           476.58 ml     Physical Exam: General: Ill appearing  HEENT Significant right periorbital ecchymoses with significant bruising on the right side of her forehead, left periorbitall ecchymosis but to a lesser degress  Neck distended neck veins  Pulm/lungs Basilar rales, normal effort  CVS/Heart S1S2 no rubs  Abdomen:  Soft, nontender, BS present  Extremities: No LE edema  Neurologic: Alert, oriented to time, person, place, difficult for pt to keep still  Skin: No acute rashes  Access: AV fistula       Basic Metabolic Panel:   Recent Labs Lab 10/06/16 1428 10/07/16 0801  NA 139 138  K 3.7 4.1  CL 100* 97*  CO2 30 28  GLUCOSE 177* 150*  BUN 29* 42*  CREATININE 6.47* 8.26*  CALCIUM 8.4* 8.4*  MG  --  1.8  PHOS  --  5.9*      CBC:  Recent Labs Lab 10/06/16 1428 10/07/16 0801  WBC 7.9 9.9  NEUTROABS 5.5  --   HGB 12.3 11.8*  HCT 40.0 38.2  MCV 96.9 98.3  PLT 288 301      Microbiology:  Recent Results (from the past 720 hour(s))  MRSA PCR Screening     Status: None   Collection Time: 09/14/16  5:52 PM  Result Value Ref Range Status   MRSA by PCR NEGATIVE NEGATIVE Final    Comment:        The GeneXpert MRSA Assay (FDA approved for NASAL specimens only), is one component of a comprehensive MRSA colonization surveillance program. It is not intended to diagnose MRSA infection nor to guide or monitor treatment for MRSA infections.   MRSA PCR Screening     Status: None   Collection Time: 10/06/16 10:14 PM  Result Value Ref Range Status   MRSA by PCR NEGATIVE NEGATIVE Final    Comment:        The GeneXpert MRSA Assay (FDA approved for NASAL specimens only), is one component of a comprehensive MRSA colonization surveillance program. It is not intended to  diagnose MRSA infection nor to guide or monitor treatment for MRSA infections.     Coagulation Studies: No results for input(s): LABPROT, INR in the last 72 hours.  Urinalysis:  Recent Labs  10/06/16 1656  COLORURINE STRAW*  LABSPEC 1.003*  PHURINE 8.0  GLUCOSEU >500*  HGBUR 1+*  BILIRUBINUR NEGATIVE  KETONESUR NEGATIVE  PROTEINUR 100*  NITRITE NEGATIVE  LEUKOCYTESUR 1+*      Imaging: Dg Pelvis 1-2 Views  Result Date: 10/06/2016 CLINICAL DATA:  Multiple falls.  Right hip pain. EXAM: PELVIS - 1-2 VIEW COMPARISON:  None. FINDINGS: There is no evidence of pelvic fracture or diastasis. No pelvic bone lesions are seen. IMPRESSION: 1. No evidence for hip fracture or dislocation. If there is high clinical suspicion for occult fracture or the patient refuses to weightbear, consider further evaluation with MRI. Although CT is expeditious, evidence is lacking regarding accuracy of CT over plain film radiography.  Electronically Signed   By: Kerby Moors M.D.   On: 10/06/2016 16:23   Ct Head Wo Contrast  Result Date: 10/06/2016 CLINICAL DATA:  Pain after multiple falls EXAM: CT HEAD WITHOUT CONTRAST TECHNIQUE: Contiguous axial images were obtained from the base of the skull through the vertex without intravenous contrast. COMPARISON:  None. FINDINGS: Brain: No subdural, epidural, or subarachnoid hemorrhage. Calcifications seen in the left basal ganglia of no acute significance. No acute cortical ischemia or infarct. The cerebellum and brainstem are normal. Basal cisterns are patent. Ventricles and sulci are normal for age. Vascular: Calcified atherosclerosis in the intracranial carotid arteries. Skull: Normal. Negative for fracture or focal lesion. Sinuses/Orbits: The paranasal sinuses and left mastoid air cells are normal. The middle ears are well aerated. There is fluid in scattered right mastoid air cells with no overlying soft tissue abnormalities or bony erosion. Other: Significance scalp thickening diffusely, centered to the right. High attenuation within the scout thickening is consistent with acute blood products. Visualized portions of the globes are. Soft tissues otherwise normal. IMPRESSION: 1. Marked scalp edema and hematoma consistent with history. No acute intracranial abnormality identified. Electronically Signed   By: Dorise Bullion III M.D   On: 10/06/2016 13:35   Mr Brain Wo Contrast  Result Date: 10/06/2016 CLINICAL DATA:  Multiple falls.  Hallucinations. EXAM: MRI HEAD WITHOUT CONTRAST TECHNIQUE: Multiplanar, multiecho pulse sequences of the brain and surrounding structures were obtained without intravenous contrast. COMPARISON:  Head CT same day FINDINGS: The study suffers from considerable motion degradation. Brain: Diffusion imaging does not show any acute or subacute infarction. The brainstem and cerebellum are normal. Cerebral hemispheres show abnormal T2 and FLAIR signal in the deep  white matter most consistent with chronic small vessel disease. No cortical or large vessel territory infarction. No mass lesion. No sign of intracranial hemorrhage. No hydrocephalus or extra-axial collection. Vascular: Major vessels at the base of the brain show flow. Skull and upper cervical spine: Somewhat heterogeneous marrow pattern. This may be due to chronic renal disease. Sinuses/Orbits: Sinuses are negative. There is mastoid effusion on the right. Other: Profound diffuse scalp swelling presumably post traumatic. IMPRESSION: No acute intracranial finding. Chronic small-vessel ischemic change of the deep white matter. Diffuse soft tissue swelling of the scalp, presumably post traumatic. Heterogeneous marrow pattern of the calvarium, presumed secondary to chronic renal disease. Electronically Signed   By: Nelson Chimes M.D.   On: 10/06/2016 15:54   Dg Chest Portable 1 View  Result Date: 10/06/2016 CLINICAL DATA:  Patient presents to the ED with multiple falls. Patient  states, "I fell 14 times today." Patient is complaining of right knee pain and right hip pain. Pt asleep during all x-ray exams; pt poor historian at time of x-rays. Hx/o COPD. Dialysis pt. EXAM: PORTABLE CHEST 1 VIEW COMPARISON:  09/15/2016 FINDINGS: Mild enlarged cardiac silhouette. There is mild diffuse interstitial pattern. No focal infiltrate. No pneumothorax. No evidence of thoracic trauma. IMPRESSION: 1. No evidence of thoracic trauma. 2. Mild interstitial edema pattern Electronically Signed   By: Suzy Bouchard M.D.   On: 10/06/2016 16:23   Dg Knee Complete 4 Views Left  Result Date: 10/06/2016 CLINICAL DATA:  Multiple falls. EXAM: LEFT KNEE - COMPLETE 4+ VIEW COMPARISON:  None. FINDINGS: No evidence of fracture, dislocation, or joint effusion. No evidence of arthropathy or other focal bone abnormality. Soft tissues are unremarkable. IMPRESSION: No fracture or dislocation. Electronically Signed   By: Suzy Bouchard M.D.   On:  10/06/2016 16:20   Dg Knee Complete 4 Views Right  Result Date: 10/06/2016 CLINICAL DATA:  Patient presents to the ED with multiple falls. Patient states, "I fell 14 times today." Patient is complaining of right knee pain and right hip pain. Pt asleep during all x-ray exams; pt poor historian at time of x-rays. Hx/o COPD. Dialysis pt. EXAM: RIGHT KNEE - COMPLETE 4+ VIEW COMPARISON:  None. FINDINGS: No fracture of the proximal tibia or distal femur. Patella is normal. No joint effusion. IMPRESSION: No fracture or dislocation. Electronically Signed   By: Suzy Bouchard M.D.   On: 10/06/2016 16:22     Medications:    . amLODipine  10 mg Oral Daily  . budesonide (PULMICORT) nebulizer solution  0.25 mg Nebulization Q12H  . calcium acetate  1,334 mg Oral TID WC  . carvedilol  25 mg Oral BID WC  . cinacalcet  60 mg Oral Q breakfast  . feeding supplement (ENSURE ENLIVE)  237 mL Oral TID BM  . furosemide  40 mg Oral Q T,Th,S,Su  . ipratropium-albuterol  3 mL Nebulization Q6H  . losartan  100 mg Oral Daily  . multivitamin  1 tablet Oral QHS  . pantoprazole  40 mg Oral BID  . sertraline  50 mg Oral Daily   sodium chloride, hydrALAZINE, HYDROcodone-acetaminophen, ondansetron (ZOFRAN) IV  Assessment/ Plan:  60 y.o.African American female with HTN, hyperlipidemia, diastolic heart failure, hypertension, COPD, tobacco abuse, AOCD, SHPTH, LUE AVF, ESRD 02/2013, admission for severe anemia/black stools 05/28/16, nonbleeding gastric ulcers on EGD 8/181/17, admission for falls with urine toxicology positive for cocaine, pt was also on chantix recently.  Large right perioribal hematoma noted.  [facility]/MWF  1. End-stage renal disease on hemodialysis MWF.  -Patient states that she did have hemodialysis on Friday. No urgent indication for dialysis at the moment.  Would avoid heparin given the extensive falls and large right periorbital ecchymosis and hematoma.  2.   altered mental status/recent  falls/urine toxicology positive for cocaine. The patient has had 14 falls per her report at home. Urine toxicology was positive for cocaine and she was also recently started on Chantix which she states was causing hallucinations. Would obviously hold Chantix and advised patient to avoid any further cocaine use. Further evaluation and management per hospitalist.  3. Secondary hyperparathyroidism.  Check phosphorus with next dialysis treatment. Continue cinacalcet.  4. AOCKD - Hemoglobin 11.8. Hold off on Epogen at this time.   LOS: 1 Jerimah Witucki 11/12/20172:02 PM

## 2016-10-07 NOTE — Progress Notes (Addendum)
PULMONARY / CRITICAL CARE MEDICINE   Name: Michele Nash MRN: [id number] DOB: [birth year]    ADMISSION DATE:  10/06/2016   REFERRING MD:  Dr. Alfred Levins  CHIEF COMPLAINT: multiple falls with head injury  HISTORY OF PRESENT ILLNESS: This is a 60 year old African-American female with a past medical history of end-stage renal disease on dialysis Monday, Wednesdays and Fridays, obstructive sleep apnea on home O2 at 3 L, COPD, depression, and hypertension who presented to the ED with altered mental status following multiple falls at home. History is obtained from patient and ED records. Patient states that she was doing well until she was started her on Chantix for smoking cessation. She started having hallucinations about a week after starting the medication and then subsequently, over the last week she has had multiple falls, approximately 10-14 falls. The most dramatic fall where on 10/05/16 night in which he she hit her head and sustained multiple bruises along her left 3. and fall, head. Her mentation continued to be poor during the day hence her husband decided to bring her to the emergency room. Upon arrival in the ED, patient was very somnolent and had extensive bruising and swelling in her right forehead. No loss of consciousness during fall and falls were not associated with dizziness. However, she reports difficulty recalling current events. She is complaining of right hip and knee pain, headache and left leg pain as well. All her imaging studies are negative. Her blood gas showed severe hypercarbia and hence she was placed on BiPAP. Her urine toxicology was positive for cocaine, but otherwise unremarkable. She denies chest pain, palpitations, nausea and vomiting   SUBJECTIVE: Improved mentation. Persistent right forehead swelling. Denies chest pain, nausea, vomiting. Tolerating a regular diet. Back to baseline breathing and mentation  VITAL SIGNS: BP (!) 137/120 (BP Location: Right Arm)    Pulse 89   Temp 98.7 F (37.1 C) (Axillary)   Resp 19   Ht 5\' 5"  (1.651 m)   Wt 126 lb 5.2 oz (57.3 kg)   SpO2 94%   BMI 21.02 kg/m   HEMODYNAMICS:    VENTILATOR SETTINGS: FiO2 (%):  [28 %] 28 %  INTAKE / OUTPUT: I/O last 3 completed shifts: In: 676.6 [P.O.:592; I.V.:84.6] Out: 100 [Urine:100]  PHYSICAL EXAMINATION: General: Appears older for age, in moderate distress when aroused, extensive bruising in right forehead Neuro:  Alert to person, place and time, follows commands, grip strength, -4/5 in right upper extremity, -3/5 in RUE, 4/5 in bilateral lower extremities, no facial droop, gag reflex intact HEENT: Subcutaneous hematoma in the right for right and eyelids, extensive bruising, left eye with scleral hemorrhage, pupils sluggish but equal, trachea midline, no thyromegaly and no JVD Cardiovascular: Rate and rhythm regular, S1, S2 audible, no murmur, regurg or gallop Lungs: Scattered rhonchi in anterior lung fields, work of breathing is normal, bilateral airflow in all lung fields, diminished in the bases, no wheezes or rales Abdomen: Nondistended, no, no scars, positive bowel sounds in all 4., palpation reveals no organomegaly Musculoskeletal:  No visible deformities, pain with flexion and extension of the right hip, pain with palpation. No focal rib cage, unable to assess gait Skin: And dry with extensive bruising in upper back along right mid axillary line in the right part of the face  LABS:  BMET  Recent Labs Lab 10/06/16 1428  NA 139  K 3.7  CL 100*  CO2 30  BUN 29*  CREATININE 6.47*  GLUCOSE 177*    Electrolytes  Recent  Labs Lab 10/06/16 1428  CALCIUM 8.4*    CBC  Recent Labs Lab 10/06/16 1428  WBC 7.9  HGB 12.3  HCT 40.0  PLT 288    Coag's No results for input(s): APTT, INR in the last 168 hours.  Sepsis Markers No results for input(s): LATICACIDVEN, PROCALCITON, O2SATVEN in the last 168 hours.  ABG  Recent Labs Lab 10/06/16 1918  10/07/16 0524  PHART 7.32* 7.32*  PCO2ART 62* 64*  PO2ART 81* 81*    Liver Enzymes  Recent Labs Lab 10/06/16 1428  AST 33  ALT 26  ALKPHOS 151*  BILITOT 0.7  ALBUMIN 3.9    Cardiac Enzymes No results for input(s): TROPONINI, PROBNP in the last 168 hours.  Glucose  Recent Labs Lab 10/06/16 2157  GLUCAP 113*    Imaging Dg Pelvis 1-2 Views  Result Date: 10/06/2016 CLINICAL DATA:  Multiple falls.  Right hip pain. EXAM: PELVIS - 1-2 VIEW COMPARISON:  None. FINDINGS: There is no evidence of pelvic fracture or diastasis. No pelvic bone lesions are seen. IMPRESSION: 1. No evidence for hip fracture or dislocation. If there is high clinical suspicion for occult fracture or the patient refuses to weightbear, consider further evaluation with MRI. Although CT is expeditious, evidence is lacking regarding accuracy of CT over plain film radiography. Electronically Signed   By: Kerby Moors M.D.   On: 10/06/2016 16:23   Ct Head Wo Contrast  Result Date: 10/06/2016 CLINICAL DATA:  Pain after multiple falls EXAM: CT HEAD WITHOUT CONTRAST TECHNIQUE: Contiguous axial images were obtained from the base of the skull through the vertex without intravenous contrast. COMPARISON:  None. FINDINGS: Brain: No subdural, epidural, or subarachnoid hemorrhage. Calcifications seen in the left basal ganglia of no acute significance. No acute cortical ischemia or infarct. The cerebellum and brainstem are normal. Basal cisterns are patent. Ventricles and sulci are normal for age. Vascular: Calcified atherosclerosis in the intracranial carotid arteries. Skull: Normal. Negative for fracture or focal lesion. Sinuses/Orbits: The paranasal sinuses and left mastoid air cells are normal. The middle ears are well aerated. There is fluid in scattered right mastoid air cells with no overlying soft tissue abnormalities or bony erosion. Other: Significance scalp thickening diffusely, centered to the right. High attenuation  within the scout thickening is consistent with acute blood products. Visualized portions of the globes are. Soft tissues otherwise normal. IMPRESSION: 1. Marked scalp edema and hematoma consistent with history. No acute intracranial abnormality identified. Electronically Signed   By: Dorise Bullion III M.D   On: 10/06/2016 13:35   Mr Brain Wo Contrast  Result Date: 10/06/2016 CLINICAL DATA:  Multiple falls.  Hallucinations. EXAM: MRI HEAD WITHOUT CONTRAST TECHNIQUE: Multiplanar, multiecho pulse sequences of the brain and surrounding structures were obtained without intravenous contrast. COMPARISON:  Head CT same day FINDINGS: The study suffers from considerable motion degradation. Brain: Diffusion imaging does not show any acute or subacute infarction. The brainstem and cerebellum are normal. Cerebral hemispheres show abnormal T2 and FLAIR signal in the deep white matter most consistent with chronic small vessel disease. No cortical or large vessel territory infarction. No mass lesion. No sign of intracranial hemorrhage. No hydrocephalus or extra-axial collection. Vascular: Major vessels at the base of the brain show flow. Skull and upper cervical spine: Somewhat heterogeneous marrow pattern. This may be due to chronic renal disease. Sinuses/Orbits: Sinuses are negative. There is mastoid effusion on the right. Other: Profound diffuse scalp swelling presumably post traumatic. IMPRESSION: No acute intracranial finding. Chronic small-vessel ischemic  change of the deep white matter. Diffuse soft tissue swelling of the scalp, presumably post traumatic. Heterogeneous marrow pattern of the calvarium, presumed secondary to chronic renal disease. Electronically Signed   By: Nelson Chimes M.D.   On: 10/06/2016 15:54   Dg Chest Portable 1 View  Result Date: 10/06/2016 CLINICAL DATA:  Patient presents to the ED with multiple falls. Patient states, "I fell 14 times today." Patient is complaining of right knee pain and  right hip pain. Pt asleep during all x-ray exams; pt poor historian at time of x-rays. Hx/o COPD. Dialysis pt. EXAM: PORTABLE CHEST 1 VIEW COMPARISON:  09/15/2016 FINDINGS: Mild enlarged cardiac silhouette. There is mild diffuse interstitial pattern. No focal infiltrate. No pneumothorax. No evidence of thoracic trauma. IMPRESSION: 1. No evidence of thoracic trauma. 2. Mild interstitial edema pattern Electronically Signed   By: Suzy Bouchard M.D.   On: 10/06/2016 16:23   Dg Knee Complete 4 Views Left  Result Date: 10/06/2016 CLINICAL DATA:  Multiple falls. EXAM: LEFT KNEE - COMPLETE 4+ VIEW COMPARISON:  None. FINDINGS: No evidence of fracture, dislocation, or joint effusion. No evidence of arthropathy or other focal bone abnormality. Soft tissues are unremarkable. IMPRESSION: No fracture or dislocation. Electronically Signed   By: Suzy Bouchard M.D.   On: 10/06/2016 16:20   Dg Knee Complete 4 Views Right  Result Date: 10/06/2016 CLINICAL DATA:  Patient presents to the ED with multiple falls. Patient states, "I fell 14 times today." Patient is complaining of right knee pain and right hip pain. Pt asleep during all x-ray exams; pt poor historian at time of x-rays. Hx/o COPD. Dialysis pt. EXAM: RIGHT KNEE - COMPLETE 4+ VIEW COMPARISON:  None. FINDINGS: No fracture of the proximal tibia or distal femur. Patella is normal. No joint effusion. IMPRESSION: No fracture or dislocation. Electronically Signed   By: Suzy Bouchard M.D.   On: 10/06/2016 16:22     STUDIES:  None  CULTURES: None  ANTIBIOTICS: None  SIGNIFICANT EVENTS: Started on Chantix 2 weeks ago with subsequent onset of hallucination and falls. 09/05/2016: Presented to the emergency room following multiple falls at home with head trauma; admitted with acute respiratory failure, head trauma and altered mental status  LINES/TUBES: None  DISCUSSION: 60 year old African-American female with end-stage renal disease on dialysis,  and substance abuse, (cocaine), presenting with acute on chronic hypercarbic respiratory failure, hypertensive crisis, head trauma secondary to traumatic fall, and altered mental status.   ASSESSMENT / PLAN:  PULMONARY A: Acute on chronic hypercarbic respiratory failure. COPD-Improved obstructive sleep apnea on home O2 P:   Nebulized bronchodilators and steroids. BiPAP at HS and supplemental O2 2-3L [place] to maintain SPO2 90-94% Chest x-ray when necessary  CARDIOVASCULAR A:  Hypertensive crisis-systolic blood pressure ranging between 190-220 mmhg-improved Hyperlipidemia P:  Hemodynamics per ICU protocol Hydralazine 10 mg IV  every 4 hours as needed to maintain systolic blood pressure between 140 and 150 mmHg Continue all home blood pressure medications  RENAL A:   End-stage renal disease on dialysis P:   Nephrology consulted for inpatient hemodialysis Monitor and correct electrolytes  GASTROINTESTINAL A:    history of GERD P:    continue home dose of PPI  NEUROLOGIC A:   right forehead hematoma secondary to falls Multiple falls Acute pain due to multiple bruised ribs P:   Monitor neurologic status. Maintain on fall precautions Norco as needed for pain management   DISPOSITION AND FAMILY UPDATE: Patient updated updated on current treatment plan. No family at  bedside   total critical care time equals 40 minutes  Magdalene [facility] ANP-BC Pulmonary and Critical Care Medicine [facility] Pager [phone] or [phone] 10/07/2016, 7:08 AM   STAFF NOTE: I, Dr. Vilinda Boehringer have personally reviewed patient's available data, including medical history, events of note, physical examination and test results as part of my evaluation. I have discussed with NP Patria Mane and other care providers such as pharmacist, RN and RRT.  In addition,  I personally evaluated patient and elicited key findings of   HPI:  60 year old African-American female with a past medical  history of end-stage renal disease on dialysis Monday, Wednesdays and Fridays, obstructive sleep apnea on home O2 at 3 L, COPD, depression, and hypertension who presented to the ED with altered mental status following multiple falls at home. History is obtained from patient and ED records. Patient states that she was doing well until she was started her on Chantix for smoking cessation. She started having hallucinations about a week after starting the medication and then subsequently, over the last week she has had multiple falls, approximately 10-14 falls. The most dramatic fall where on 10/05/16 night in which he she hit her head and sustained multiple bruises along her left 3. and fall, head. Her mentation continued to be poor during the day hence her husband decided to bring her to the emergency room. Upon arrival in the ED, patient was very somnolent and had extensive bruising and swelling in her right forehead.  Patient has baseline hypercapnia, in the 60s, and was placed on bipap, this morning she is alert and oriented x 3, no complaints of dyspnea.  O:  GEN-NAD, AAOx3 HEENT-Subcutaneous hematoma in the right for right and eyelids, extensive bruising, left eye with scleral hemorrhage, pupils sluggish but equal, trachea midline, no thyromegaly and no JVD CVS-s1,s, RRR, no murmurs LUNGS-good airway entry, no wheezes, no crackles ABD-soft, nt, nd, +bs MSK-no edema   Recent Labs CBC Latest Ref Rng & Units 10/07/2016 10/06/2016 09/16/2016  WBC 3.6 - 11.0 K/uL 9.9 7.9 8.0  Hemoglobin 12.0 - 16.0 g/dL 11.8(L) 12.3 11.3(L)  Hematocrit 35.0 - 47.0 % 38.2 40.0 35.5  Platelets 150 - 440 K/uL 301 288 293      Recent Labs BMP Latest Ref Rng & Units 10/07/2016 10/06/2016 09/16/2016  Glucose 65 - 99 mg/dL 150(H) 177(H) 133(H)  BUN 6 - 20 mg/dL 42(H) 29(H) 57(H)  Creatinine 0.44 - 1.00 mg/dL 8.26(H) 6.47(H) 6.35(H)  Sodium 135 - 145 mmol/L 138 139 138  Potassium 3.5 - 5.1 mmol/L 4.1 3.7 4.8   Chloride 101 - 111 mmol/L 97(L) 100(L) 95(L)  CO2 22 - 32 mmol/L 28 30 32  Calcium 8.9 - 10.3 mg/dL 8.4(L) 8.4(L) 8.7(L)       (The following images and results were reviewed by Dr. Stevenson Clinch on 10/07/2016). Dg Pelvis 1-2 Views  Result Date: 10/06/2016 CLINICAL DATA:  Multiple falls.  Right hip pain. EXAM: PELVIS - 1-2 VIEW COMPARISON:  None. FINDINGS: There is no evidence of pelvic fracture or diastasis. No pelvic bone lesions are seen. IMPRESSION: 1. No evidence for hip fracture or dislocation. If there is high clinical suspicion for occult fracture or the patient refuses to weightbear, consider further evaluation with MRI. Although CT is expeditious, evidence is lacking regarding accuracy of CT over plain film radiography. Electronically Signed   By: Kerby Moors M.D.   On: 10/06/2016 16:23   Ct Head Wo Contrast  Result Date: 10/06/2016 CLINICAL DATA:  Pain after multiple  falls EXAM: CT HEAD WITHOUT CONTRAST TECHNIQUE: Contiguous axial images were obtained from the base of the skull through the vertex without intravenous contrast. COMPARISON:  None. FINDINGS: Brain: No subdural, epidural, or subarachnoid hemorrhage. Calcifications seen in the left basal ganglia of no acute significance. No acute cortical ischemia or infarct. The cerebellum and brainstem are normal. Basal cisterns are patent. Ventricles and sulci are normal for age. Vascular: Calcified atherosclerosis in the intracranial carotid arteries. Skull: Normal. Negative for fracture or focal lesion. Sinuses/Orbits: The paranasal sinuses and left mastoid air cells are normal. The middle ears are well aerated. There is fluid in scattered right mastoid air cells with no overlying soft tissue abnormalities or bony erosion. Other: Significance scalp thickening diffusely, centered to the right. High attenuation within the scout thickening is consistent with acute blood products. Visualized portions of the globes are. Soft tissues otherwise  normal. IMPRESSION: 1. Marked scalp edema and hematoma consistent with history. No acute intracranial abnormality identified. Electronically Signed   By: Dorise Bullion III M.D   On: 10/06/2016 13:35   Mr Brain Wo Contrast  Result Date: 10/06/2016 CLINICAL DATA:  Multiple falls.  Hallucinations. EXAM: MRI HEAD WITHOUT CONTRAST TECHNIQUE: Multiplanar, multiecho pulse sequences of the brain and surrounding structures were obtained without intravenous contrast. COMPARISON:  Head CT same day FINDINGS: The study suffers from considerable motion degradation. Brain: Diffusion imaging does not show any acute or subacute infarction. The brainstem and cerebellum are normal. Cerebral hemispheres show abnormal T2 and FLAIR signal in the deep white matter most consistent with chronic small vessel disease. No cortical or large vessel territory infarction. No mass lesion. No sign of intracranial hemorrhage. No hydrocephalus or extra-axial collection. Vascular: Major vessels at the base of the brain show flow. Skull and upper cervical spine: Somewhat heterogeneous marrow pattern. This may be due to chronic renal disease. Sinuses/Orbits: Sinuses are negative. There is mastoid effusion on the right. Other: Profound diffuse scalp swelling presumably post traumatic. IMPRESSION: No acute intracranial finding. Chronic small-vessel ischemic change of the deep white matter. Diffuse soft tissue swelling of the scalp, presumably post traumatic. Heterogeneous marrow pattern of the calvarium, presumed secondary to chronic renal disease. Electronically Signed   By: Nelson Chimes M.D.   On: 10/06/2016 15:54   Dg Chest Portable 1 View  Result Date: 10/06/2016 CLINICAL DATA:  Patient presents to the ED with multiple falls. Patient states, "I fell 14 times today." Patient is complaining of right knee pain and right hip pain. Pt asleep during all x-ray exams; pt poor historian at time of x-rays. Hx/o COPD. Dialysis pt. EXAM: PORTABLE CHEST  1 VIEW COMPARISON:  09/15/2016 FINDINGS: Mild enlarged cardiac silhouette. There is mild diffuse interstitial pattern. No focal infiltrate. No pneumothorax. No evidence of thoracic trauma. IMPRESSION: 1. No evidence of thoracic trauma. 2. Mild interstitial edema pattern Electronically Signed   By: Suzy Bouchard M.D.   On: 10/06/2016 16:23   Dg Knee Complete 4 Views Left  Result Date: 10/06/2016 CLINICAL DATA:  Multiple falls. EXAM: LEFT KNEE - COMPLETE 4+ VIEW COMPARISON:  None. FINDINGS: No evidence of fracture, dislocation, or joint effusion. No evidence of arthropathy or other focal bone abnormality. Soft tissues are unremarkable. IMPRESSION: No fracture or dislocation. Electronically Signed   By: Suzy Bouchard M.D.   On: 10/06/2016 16:20   Dg Knee Complete 4 Views Right  Result Date: 10/06/2016 CLINICAL DATA:  Patient presents to the ED with multiple falls. Patient states, "I fell 14 times today." Patient  is complaining of right knee pain and right hip pain. Pt asleep during all x-ray exams; pt poor historian at time of x-rays. Hx/o COPD. Dialysis pt. EXAM: RIGHT KNEE - COMPLETE 4+ VIEW COMPARISON:  None. FINDINGS: No fracture of the proximal tibia or distal femur. Patella is normal. No joint effusion. IMPRESSION: No fracture or dislocation. Electronically Signed   By: Suzy Bouchard M.D.   On: 10/06/2016 16:46      A:60 year old African-American female with end-stage renal disease on dialysis, and substance abuse, (cocaine), presenting with acute on chronic hypercarbic respiratory failure, hypertensive crisis, head trauma secondary to traumatic fall, and altered mental status., now back to baseline  AMS , consfusion - resolving Somnambulism Visual Hallucinations Acute on Chronic Hypercarbic respiratory failure OSA on O2 HLD HTN urgency UDS with cocaine Large Right Orbit Hematoma ESRD on HD Multiple falls  P:   - AMS resolving, mostly related adverse effects of chantix, stop  chantix - CT head and MRI head - no brain hemorrhage, mainly soft tissue injury around the right orbit - cont to monitor R orbit, may need opthalmo eval if not improving.  - cont with BP management - cont with O2 to maintain sat>88%  Stable to for transfer to Med/Surg unit Spoke with Dr. Benjie Karvonen, transfer to hospitalist service in the AM.   .  Rest per NP/medical resident whose note is outlined above and that I agree with  The patient is critically ill with multiple organ systems failure and requires high complexity decision making for assessment and support, frequent evaluation and titration of therapies, application of advanced monitoring technologies and extensive interpretation of multiple databases.   Critical Care Time devoted to patient care services described in this note is  40 Minutes.   This time reflects time of care of this signee Dr Vilinda Boehringer.  This critical care time does not reflect procedure time, or teaching time or supervisory time of PA/NP/Med-student/Med Resident etc but could involve care discussion time.  Vilinda Boehringer, MD [facility] Pager [phone] (please enter 7-digits) On Call Pager [phone] (please enter 7-digits)  Note: This note was prepared with Dragon dictation along with smaller phrase technology. Any transcriptional errors that result from this process are unintentional.

## 2016-10-07 NOTE — Progress Notes (Signed)
Patient not able to wear bipap tonight due to intense swelling in both eye areas and pain in top of her head. Placed on 3liters via nasal cannula tolerating well

## 2016-10-07 NOTE — Progress Notes (Signed)
Reported called to 2A. Will be going to room 207.

## 2016-10-08 LAB — URINE CULTURE: Culture: NO GROWTH

## 2016-10-08 LAB — PHOSPHORUS: PHOSPHORUS: 4.7 mg/dL — AB (ref 2.5–4.6)

## 2016-10-08 MED ORDER — ACETAMINOPHEN 325 MG PO TABS
650.0000 mg | ORAL_TABLET | Freq: Four times a day (QID) | ORAL | Status: DC | PRN
  Administered 2016-10-10 (×2): 650 mg via ORAL
  Filled 2016-10-08 (×2): qty 2

## 2016-10-08 MED ORDER — BUDESONIDE 0.25 MG/2ML IN SUSP
0.2500 mg | Freq: Two times a day (BID) | RESPIRATORY_TRACT | Status: DC
  Administered 2016-10-08 – 2016-10-11 (×6): 0.25 mg via RESPIRATORY_TRACT
  Filled 2016-10-08 (×6): qty 2

## 2016-10-08 MED ORDER — FLUTICASONE PROPIONATE 50 MCG/ACT NA SUSP
2.0000 | Freq: Every day | NASAL | Status: DC
  Administered 2016-10-09 – 2016-10-11 (×3): 2 via NASAL
  Filled 2016-10-08: qty 16

## 2016-10-08 MED ORDER — HYDRALAZINE HCL 50 MG PO TABS
50.0000 mg | ORAL_TABLET | Freq: Three times a day (TID) | ORAL | Status: DC
  Administered 2016-10-08 – 2016-10-11 (×8): 50 mg via ORAL
  Filled 2016-10-08 (×8): qty 1

## 2016-10-08 NOTE — Evaluation (Signed)
Physical Therapy Evaluation Patient Details Name: Michele Nash MRN: [id number] DOB: 03/08/1956 Today's Date: 10/08/2016   History of Present Illness  Pt is a 60 y/o F who presented to the ED with AMS following 10-14 falls at home over the past week. Patient states that she was doing well until she was started her on Chantix for smoking cessation. Upon arrival at ED pt had extensive bruising and swelling in her R forehead.  All imaging studies negative.  Her blood gas showed severe hypercarbia and hence she was placed on BiPAP  Her urine toxicology was positive for cocaine.  Pt's PMH includes COPD, dialysis patient.    Clinical Impression  Pt admitted with above diagnosis. Pt currently with functional limitations due to the deficits listed below (see PT Problem List). [name] presents with +orthostatics with dizziness and lightheadedness from sitting to standing.  Systolic BP [id number] seated and 139 standing.  She is from home with her husband where she has had 10-14 falls over the past week with majority of falls occurring after standing from the toilet or couch.  Prior to a week ago pt denies any falls.  She says she can have 24/7 supervision at d/c from her husband and sister if needed.  She currently requires mod assist for transfers due to her inability to voluntarily open her eyes (due to swelling).  Once pt able to tolerate more activity she will likely benefit from use of RW to assist in the reduction in the number of falls. Pt will benefit from skilled PT to increase their independence and safety with mobility to allow discharge to the venue listed below.      Follow Up Recommendations Home health PT    Equipment Recommendations  Rolling walker with 5" wheels    Recommendations for Other Services       Precautions / Restrictions Precautions Precautions: Fall;Other (comment) Precaution Comments: due to swelling pt unable to keep eyes open without manually holding them  open Restrictions Weight Bearing Restrictions: No      Mobility  Bed Mobility               General bed mobility comments: Pt sitting on BSC upon PT arrival  Transfers Overall transfer level: Needs assistance Equipment used: 1 person hand held assist Transfers: Sit to/from Stand;Stand Pivot Transfers Sit to Stand: Mod assist Stand pivot transfers: Mod assist       General transfer comment: Min assist to steady to stand from [facility] but mod assist needed once pt becomes dizzy/lightheaded and unstable.  As she is unable to voluntarily lift her eyelids she requires mod assist to take a few steps and pivot to the chair.  Ambulation/Gait Ambulation/Gait assistance: Mod assist Ambulation Distance (Feet): 3 Feet Assistive device: 1 person hand held assist Gait Pattern/deviations: Shuffle;Narrow base of support   Gait velocity interpretation: Below normal speed for age/gender General Gait Details: Pt requires mod assist as she is unable to see where she is going and is unsteady.  Did not ambulate farther due to +dizziness/lightheadedness when standing upright  Stairs            Wheelchair Mobility    Modified Rankin (Stroke Patients Only)       Balance Overall balance assessment: Needs assistance;History of Falls Sitting-balance support: No upper extremity supported;Feet supported Sitting balance-Leahy Scale: Fair     Standing balance support: Single extremity supported;During functional activity Standing balance-Leahy Scale: Poor Standing balance comment: Relies on UE support to steady  Pertinent Vitals/Pain Pain Assessment: Faces Faces Pain Scale: Hurts even more Pain Location: generalized Pain Descriptors / Indicators: Grimacing;Moaning Pain Intervention(s): Limited activity within patient's tolerance;Monitored during session;Repositioned    Home Living Family/patient expects to be discharged to:: Private  residence Living Arrangements: Spouse/significant other Available Help at Discharge: Family;Available 24 hours/day Type of Home: Apartment Home Access: Stairs to enter Entrance Stairs-Rails: Left;Right (L halfway up and then it turns with R rail the remainder ) Entrance Stairs-Number of Steps: 14 Home Layout: One level Home Equipment: Cane - single point Additional Comments: husband works 2pm-10pm, pt can go to her friend's or sister's while her husband is at work    Prior Function Level of Independence: Independent         Comments: 3L O2 at baseline.  Pt reports she has been ambulating without AD.  All of her falls have occurred in her home with the majority taking place after standing from the toilet or couch.     Hand Dominance   Dominant Hand: Right    Extremity/Trunk Assessment   Upper Extremity Assessment: Overall WFL for tasks assessed           Lower Extremity Assessment: Overall WFL for tasks assessed      Cervical / Trunk Assessment: Normal  Communication   Communication: No difficulties  Cognition Arousal/Alertness: Awake/alert Behavior During Therapy: WFL for tasks assessed/performed Overall Cognitive Status: Within Functional Limits for tasks assessed                      General Comments General comments (skin integrity, edema, etc.): Systolic BP drops from [id number] seated to 139 standing with +dizziness/lightheadedness.  Pt's SpO2 89% on RA upon PT arrival in room, 2L O2 via [place] reapplied.    Exercises General Exercises - Upper Extremity Shoulder Flexion: AROM;Both;10 reps;Seated General Exercises - Lower Extremity Ankle Circles/Pumps: AROM;Both;15 reps;Seated Long Arc Quad: AROM;Both;10 reps;Seated Straight Leg Raises: AROM;Both;15 reps;Seated Hip Flexion/Marching: AROM;Both;10 reps;Seated   Assessment/Plan    PT Assessment Patient needs continued PT services  PT Problem List Decreased activity tolerance;Decreased balance;Decreased  knowledge of use of DME;Decreased safety awareness;Pain          PT Treatment Interventions DME instruction;Gait training;Stair training;Functional mobility training;Therapeutic activities;Therapeutic exercise;Balance training;Patient/family education;Modalities    PT Goals (Current goals can be found in the Care Plan section)  Acute Rehab PT Goals Patient Stated Goal: to feel better and go home PT Goal Formulation: With patient/family Time For Goal Achievement: 10/15/16 Potential to Achieve Goals: Fair    Frequency Min 2X/week   Barriers to discharge        Co-evaluation               End of Session Equipment Utilized During Treatment: Gait belt;Oxygen Activity Tolerance: Treatment limited secondary to medical complications (Comment) (orthostatic) Patient left: in chair;with call bell/phone within reach;with chair alarm set;with nursing/sitter in room Nurse Communication: Mobility status;Other (comment) (BP readings)         Time: [id number] PT Time Calculation (min) (ACUTE ONLY): 33 min   Charges:   PT Evaluation $PT Eval Low Complexity: 1 Procedure PT Treatments $Therapeutic Exercise: 8-22 mins   PT G Codes:       Collie Siad PT, DPT 10/08/2016, 9:38 AM

## 2016-10-08 NOTE — Progress Notes (Signed)
Pre hd info 

## 2016-10-08 NOTE — Progress Notes (Signed)
Subjective:   Patient seen and examined on hemodialysis. Tolerating treatment well. UF of 2 litres. Blood pressure elevated: 103/83    HEMODIALYSIS FLOWSHEET:  Blood Flow Rate (mL/min): 400 mL/min Arterial Pressure (mmHg): -160 mmHg Venous Pressure (mmHg): 250 mmHg Transmembrane Pressure (mmHg): 60 mmHg Ultrafiltration Rate (mL/min): 570 mL/min Dialysate Flow Rate (mL/min): 800 ml/min Conductivity: Machine : 14.1 Conductivity: Machine : 14.1 Dialysis Fluid Bolus: Normal Saline Bolus Amount (mL): 250 mL (prime) Dialysate Change: 2K Intra-Hemodialysis Comments: 851. pt alert, MD present, bp high, other vss, no c/o.    Objective:  Vital signs in last 24 hours:  Temp:  [97.5 F (36.4 C)-98.4 F (36.9 C)] 98.3 F (36.8 C) (11/13 1115) Pulse Rate:  [59-78] 78 (11/13 1250) Resp:  [16-21] 21 (11/13 1250) BP: (133-203)/(58-97) 203/83 (11/13 1250) SpO2:  [91 %-100 %] 100 % (11/13 1250) FiO2 (%):  [32 %] 32 % (11/13 0742) Weight:  [57.7 kg (127 lb 1.6 oz)-60.2 kg (132 lb 11.5 oz)] 60.2 kg (132 lb 11.5 oz) (11/13 1115)  Weight change: -4.037 kg (-8 lb 14.4 oz) Filed Weights   10/07/16 0620 10/08/16 0500 10/08/16 1115  Weight: 57.3 kg (126 lb 5.2 oz) 57.7 kg (127 lb 1.6 oz) 60.2 kg (132 lb 11.5 oz)    Intake/Output:    Intake/Output Summary (Last 24 hours) at 10/08/16 1255 Last data filed at 10/08/16 0958  Gross per 24 hour  Intake              840 ml  Output               25 ml  Net              815 ml     Physical Exam: General: Ill appearing  HEENT Bilateral periorbital ecchymosis  Neck distended neck veins  Pulm/lungs clear  CVS/Heart S1S2 no rubs, regular  Abdomen:  Soft, nontender, BS present  Extremities: No LE edema  Neurologic: Alert, oriented to time, person, placel  Skin: No acute rashes  Access: Left arm AV fistula       Basic Metabolic Panel:   Recent Labs Lab 10/06/16 1428 10/07/16 0801 10/08/16 1120  NA 139 138  --   K 3.7 4.1  --   CL  100* 97*  --   CO2 30 28  --   GLUCOSE 177* 150*  --   BUN 29* 42*  --   CREATININE 6.47* 8.26*  --   CALCIUM 8.4* 8.4*  --   MG  --  1.8  --   PHOS  --  5.9* 4.7*     CBC:  Recent Labs Lab 10/06/16 1428 10/07/16 0801  WBC 7.9 9.9  NEUTROABS 5.5  --   HGB 12.3 11.8*  HCT 40.0 38.2  MCV 96.9 98.3  PLT 288 301      Microbiology:  Recent Results (from the past 720 hour(s))  MRSA PCR Screening     Status: None   Collection Time: 09/14/16  5:52 PM  Result Value Ref Range Status   MRSA by PCR NEGATIVE NEGATIVE Final    Comment:        The GeneXpert MRSA Assay (FDA approved for NASAL specimens only), is one component of a comprehensive MRSA colonization surveillance program. It is not intended to diagnose MRSA infection nor to guide or monitor treatment for MRSA infections.   Urine culture     Status: None   Collection Time: 10/06/16  5:17 PM  Result Value  Ref Range Status   Specimen Description URINE, RANDOM  Final   Special Requests NONE  Final   Culture NO GROWTH Performed at [facility]   Final   Report Status 10/08/2016 FINAL  Final  MRSA PCR Screening     Status: None   Collection Time: 10/06/16 10:14 PM  Result Value Ref Range Status   MRSA by PCR NEGATIVE NEGATIVE Final    Comment:        The GeneXpert MRSA Assay (FDA approved for NASAL specimens only), is one component of a comprehensive MRSA colonization surveillance program. It is not intended to diagnose MRSA infection nor to guide or monitor treatment for MRSA infections.     Coagulation Studies: No results for input(s): LABPROT, INR in the last 72 hours.  Urinalysis:  Recent Labs  10/06/16 1656  COLORURINE STRAW*  LABSPEC 1.003*  PHURINE 8.0  GLUCOSEU >500*  HGBUR 1+*  BILIRUBINUR NEGATIVE  KETONESUR NEGATIVE  PROTEINUR 100*  NITRITE NEGATIVE  LEUKOCYTESUR 1+*      Imaging: Dg Pelvis 1-2 Views  Result Date: 10/06/2016 CLINICAL DATA:  Multiple falls.  Right  hip pain. EXAM: PELVIS - 1-2 VIEW COMPARISON:  None. FINDINGS: There is no evidence of pelvic fracture or diastasis. No pelvic bone lesions are seen. IMPRESSION: 1. No evidence for hip fracture or dislocation. If there is high clinical suspicion for occult fracture or the patient refuses to weightbear, consider further evaluation with MRI. Although CT is expeditious, evidence is lacking regarding accuracy of CT over plain film radiography. Electronically Signed   By: Kerby Moors M.D.   On: 10/06/2016 16:23   Ct Head Wo Contrast  Result Date: 10/07/2016 CLINICAL DATA:  Sharp headache and increased orbital swelling status post fall. EXAM: CT HEAD WITHOUT CONTRAST TECHNIQUE: Contiguous axial images were obtained from the base of the skull through the vertex without intravenous contrast. COMPARISON:  CT of the head and MRI of the head 10/06/2016 FINDINGS: Brain: No evidence of acute infarction, hemorrhage, hydrocephalus, extra-axial collection or mass lesion/mass effect. Chronic small vessel ischemic changes are seen in the periventricular white matter. Vascular: No hyperdense vessels. Calcific atherosclerotic disease at the skullbase. Skull: Negative for fracture or focal lesion. There is a marked predominantly right-sided scalp swelling, which extends to the preseptal right more than left orbits. Sinuses/Orbits: No acute finding. Other: Marked predominantly right-sided scalp hematoma/edema, which now extends to the preseptal right more than left orbits. Right occipital scalp subcutaneous nodule may represent a dermal lesion. IMPRESSION: No evidence of acute intracranial abnormality. Enlargement of previously seen predominantly right-sided mark scalp hematoma/ edema, which now extends to right more than left preseptal orbits. Electronically Signed   By: Fidela Salisbury M.D.   On: 10/07/2016 19:24   Ct Head Wo Contrast  Result Date: 10/06/2016 CLINICAL DATA:  Pain after multiple falls EXAM: CT HEAD  WITHOUT CONTRAST TECHNIQUE: Contiguous axial images were obtained from the base of the skull through the vertex without intravenous contrast. COMPARISON:  None. FINDINGS: Brain: No subdural, epidural, or subarachnoid hemorrhage. Calcifications seen in the left basal ganglia of no acute significance. No acute cortical ischemia or infarct. The cerebellum and brainstem are normal. Basal cisterns are patent. Ventricles and sulci are normal for age. Vascular: Calcified atherosclerosis in the intracranial carotid arteries. Skull: Normal. Negative for fracture or focal lesion. Sinuses/Orbits: The paranasal sinuses and left mastoid air cells are normal. The middle ears are well aerated. There is fluid in scattered right mastoid air cells with no  overlying soft tissue abnormalities or bony erosion. Other: Significance scalp thickening diffusely, centered to the right. High attenuation within the scout thickening is consistent with acute blood products. Visualized portions of the globes are. Soft tissues otherwise normal. IMPRESSION: 1. Marked scalp edema and hematoma consistent with history. No acute intracranial abnormality identified. Electronically Signed   By: Dorise Bullion III M.D   On: 10/06/2016 13:35   Mr Brain Wo Contrast  Result Date: 10/06/2016 CLINICAL DATA:  Multiple falls.  Hallucinations. EXAM: MRI HEAD WITHOUT CONTRAST TECHNIQUE: Multiplanar, multiecho pulse sequences of the brain and surrounding structures were obtained without intravenous contrast. COMPARISON:  Head CT same day FINDINGS: The study suffers from considerable motion degradation. Brain: Diffusion imaging does not show any acute or subacute infarction. The brainstem and cerebellum are normal. Cerebral hemispheres show abnormal T2 and FLAIR signal in the deep white matter most consistent with chronic small vessel disease. No cortical or large vessel territory infarction. No mass lesion. No sign of intracranial hemorrhage. No hydrocephalus  or extra-axial collection. Vascular: Major vessels at the base of the brain show flow. Skull and upper cervical spine: Somewhat heterogeneous marrow pattern. This may be due to chronic renal disease. Sinuses/Orbits: Sinuses are negative. There is mastoid effusion on the right. Other: Profound diffuse scalp swelling presumably post traumatic. IMPRESSION: No acute intracranial finding. Chronic small-vessel ischemic change of the deep white matter. Diffuse soft tissue swelling of the scalp, presumably post traumatic. Heterogeneous marrow pattern of the calvarium, presumed secondary to chronic renal disease. Electronically Signed   By: Nelson Chimes M.D.   On: 10/06/2016 15:54   Dg Chest Portable 1 View  Result Date: 10/06/2016 CLINICAL DATA:  Patient presents to the ED with multiple falls. Patient states, "I fell 14 times today." Patient is complaining of right knee pain and right hip pain. Pt asleep during all x-ray exams; pt poor historian at time of x-rays. Hx/o COPD. Dialysis pt. EXAM: PORTABLE CHEST 1 VIEW COMPARISON:  09/15/2016 FINDINGS: Mild enlarged cardiac silhouette. There is mild diffuse interstitial pattern. No focal infiltrate. No pneumothorax. No evidence of thoracic trauma. IMPRESSION: 1. No evidence of thoracic trauma. 2. Mild interstitial edema pattern Electronically Signed   By: Suzy Bouchard M.D.   On: 10/06/2016 16:23   Dg Knee Complete 4 Views Left  Result Date: 10/06/2016 CLINICAL DATA:  Multiple falls. EXAM: LEFT KNEE - COMPLETE 4+ VIEW COMPARISON:  None. FINDINGS: No evidence of fracture, dislocation, or joint effusion. No evidence of arthropathy or other focal bone abnormality. Soft tissues are unremarkable. IMPRESSION: No fracture or dislocation. Electronically Signed   By: Suzy Bouchard M.D.   On: 10/06/2016 16:20   Dg Knee Complete 4 Views Right  Result Date: 10/06/2016 CLINICAL DATA:  Patient presents to the ED with multiple falls. Patient states, "I fell 14 times  today." Patient is complaining of right knee pain and right hip pain. Pt asleep during all x-ray exams; pt poor historian at time of x-rays. Hx/o COPD. Dialysis pt. EXAM: RIGHT KNEE - COMPLETE 4+ VIEW COMPARISON:  None. FINDINGS: No fracture of the proximal tibia or distal femur. Patella is normal. No joint effusion. IMPRESSION: No fracture or dislocation. Electronically Signed   By: Suzy Bouchard M.D.   On: 10/06/2016 16:22     Medications:    . amLODipine  10 mg Oral Daily  . budesonide (PULMICORT) nebulizer solution  0.25 mg Nebulization BID  . calcium acetate  1,334 mg Oral TID WC  . carvedilol  25  mg Oral BID WC  . cinacalcet  60 mg Oral Q breakfast  . feeding supplement (ENSURE ENLIVE)  237 mL Oral TID BM  . furosemide  40 mg Oral Q T,Th,S,Su  . hydrALAZINE  50 mg Oral Q8H  . ipratropium-albuterol  3 mL Nebulization TID  . losartan  100 mg Oral Daily  . multivitamin  1 tablet Oral QHS  . pantoprazole  40 mg Oral BID  . sertraline  50 mg Oral Daily   sodium chloride, hydrALAZINE, HYDROcodone-acetaminophen, ondansetron (ZOFRAN) IV  Assessment/ Plan:  [name] with hypertension, hyperlipidemia, diastolic heart failure, hypertension, COPD, tobacco abuse, AOCD, SHPTH, LUE AVF, ESRD 02/2013, cocaine abuse Admitted on 10/06/2016 for Acute respiratory failure with hypercapnia (HCC) [J96.02] Traumatic hematoma of forehead, initial encounter [S00.83XA] Fall, sequela [W19.XXXS] Altered mental status, unspecified altered mental status type [R41.82]   [facility]/MWF  1. End-stage renal disease on hemodialysis MWF.  Seen and examined on hemodialysis. Tolerating treatment well.   2. Hypertension: elevated due to pain on treatment.  -  amlodipine, carvedilol, hydralazine - avoid beta blockers  3. Anemia of chronic kidney disease: hemoglobin 11.8 - hold epo  4. Secondary hyperparathyroidism. Outpatient PTH 774. Phosphorus and calcium at goal.  - calcium acetate  with meals - Cinacalcet   LOS: 2 Theone Bowell 11/13/201712:55 PM

## 2016-10-08 NOTE — Progress Notes (Signed)
Post hd assessment 

## 2016-10-08 NOTE — Progress Notes (Signed)
Start of hd 

## 2016-10-08 NOTE — Progress Notes (Signed)
  End of hd 

## 2016-10-08 NOTE — Progress Notes (Signed)
Post hd vitals 

## 2016-10-08 NOTE — Progress Notes (Signed)
[facility] [place] NAME: Michele Nash    MR#:  [id number]  DATE OF BIRTH:  1956-09-08  SUBJECTIVE:  CHIEF COMPLAINT:   Chief Complaint  Patient presents with  . Fall   Patient admitted for fall and hypercarbic respiratory failure. Now transferred out of ICU. Unable to open eyes due to periorbital hematomas. Seen in hemodialysis. On 3 L oxygen.  REVIEW OF SYSTEMS:    Review of Systems  Constitutional: Positive for malaise/fatigue. Negative for chills and fever.  HENT: Negative for sore throat.   Eyes: Positive for pain. Negative for blurred vision and double vision.  Respiratory: Positive for cough and shortness of breath. Negative for hemoptysis and wheezing.   Cardiovascular: Negative for chest pain, palpitations, orthopnea and leg swelling.  Gastrointestinal: Negative for abdominal pain, constipation, diarrhea, heartburn, nausea and vomiting.  Genitourinary: Negative for dysuria and hematuria.  Musculoskeletal: Positive for back pain. Negative for joint pain.  Skin: Negative for rash.  Neurological: Positive for weakness. Negative for sensory change, speech change, focal weakness and headaches.  Endo/Heme/Allergies: Does not bruise/bleed easily.  Psychiatric/Behavioral: Negative for depression. The patient is not nervous/anxious.     DRUG ALLERGIES:   Allergies  Allergen Reactions  . Chantix [Varenicline]     hallucinations  . Sulfa Antibiotics Itching, Swelling, Rash and Other (See Comments)    Reaction:  Facial/body swelling     VITALS:  Blood pressure (!) 167/85, pulse 73, temperature 98.3 F (36.8 C), temperature source Oral, resp. rate 16, height 5\' 5"  (1.651 m), weight 60.2 kg (132 lb 11.5 oz), SpO2 100 %.  PHYSICAL EXAMINATION:   Physical Exam  GENERAL:  60 y.o.-year-old patient lying in the bed with no acute distress. Drowzy EYES: Pupils equal, round, reactive to light and accommodation. No scleral icterus.  Extraocular muscles intact.  HEENT: Bilateral periorbital hematomas. Oropharynx and nasopharynx clear. Pupils reactive to light. Conjunctival hemorrhage Right. NECK:  Supple, no jugular venous distention. No thyroid enlargement, no tenderness.  LUNGS: Normal breath sounds bilaterally, no wheezing, rales, rhonchi. No use of accessory muscles of respiration.  CARDIOVASCULAR: S1, S2 normal. No murmurs, rubs, or gallops.  ABDOMEN: Soft, nontender, nondistended. Bowel sounds present. No organomegaly or mass.  EXTREMITIES: No cyanosis, clubbing or edema b/l.    NEUROLOGIC: Cranial nerves II through XII are intact. No focal Motor or sensory deficits b/l.   PSYCHIATRIC: The patient is drowzy SKIN: No obvious rash, lesion, or ulcer.   LABORATORY PANEL:   CBC  Recent Labs Lab 10/07/16 0801  WBC 9.9  HGB 11.8*  HCT 38.2  PLT 301   ------------------------------------------------------------------------------------------------------------------ Chemistries   Recent Labs Lab 10/06/16 1428 10/07/16 0801  NA 139 138  K 3.7 4.1  CL 100* 97*  CO2 30 28  GLUCOSE 177* 150*  BUN 29* 42*  CREATININE 6.47* 8.26*  CALCIUM 8.4* 8.4*  MG  --  1.8  AST 33  --   ALT 26  --   ALKPHOS 151*  --   BILITOT 0.7  --    ------------------------------------------------------------------------------------------------------------------  Cardiac Enzymes No results for input(s): TROPONINI in the last 168 hours. ------------------------------------------------------------------------------------------------------------------  RADIOLOGY:  Dg Pelvis 1-2 Views  Result Date: 10/06/2016 CLINICAL DATA:  Multiple falls.  Right hip pain. EXAM: PELVIS - 1-2 VIEW COMPARISON:  None. FINDINGS: There is no evidence of pelvic fracture or diastasis. No pelvic bone lesions are seen. IMPRESSION: 1. No evidence for hip fracture or dislocation. If there is high clinical suspicion  for occult fracture or the patient  refuses to weightbear, consider further evaluation with MRI. Although CT is expeditious, evidence is lacking regarding accuracy of CT over plain film radiography. Electronically Signed   By: Kerby Moors M.D.   On: 10/06/2016 16:23   Ct Head Wo Contrast  Result Date: 10/07/2016 CLINICAL DATA:  Sharp headache and increased orbital swelling status post fall. EXAM: CT HEAD WITHOUT CONTRAST TECHNIQUE: Contiguous axial images were obtained from the base of the skull through the vertex without intravenous contrast. COMPARISON:  CT of the head and MRI of the head 10/06/2016 FINDINGS: Brain: No evidence of acute infarction, hemorrhage, hydrocephalus, extra-axial collection or mass lesion/mass effect. Chronic small vessel ischemic changes are seen in the periventricular white matter. Vascular: No hyperdense vessels. Calcific atherosclerotic disease at the skullbase. Skull: Negative for fracture or focal lesion. There is a marked predominantly right-sided scalp swelling, which extends to the preseptal right more than left orbits. Sinuses/Orbits: No acute finding. Other: Marked predominantly right-sided scalp hematoma/edema, which now extends to the preseptal right more than left orbits. Right occipital scalp subcutaneous nodule may represent a dermal lesion. IMPRESSION: No evidence of acute intracranial abnormality. Enlargement of previously seen predominantly right-sided mark scalp hematoma/ edema, which now extends to right more than left preseptal orbits. Electronically Signed   By: Fidela Salisbury M.D.   On: 10/07/2016 19:24   Ct Head Wo Contrast  Result Date: 10/06/2016 CLINICAL DATA:  Pain after multiple falls EXAM: CT HEAD WITHOUT CONTRAST TECHNIQUE: Contiguous axial images were obtained from the base of the skull through the vertex without intravenous contrast. COMPARISON:  None. FINDINGS: Brain: No subdural, epidural, or subarachnoid hemorrhage. Calcifications seen in the left basal ganglia of no  acute significance. No acute cortical ischemia or infarct. The cerebellum and brainstem are normal. Basal cisterns are patent. Ventricles and sulci are normal for age. Vascular: Calcified atherosclerosis in the intracranial carotid arteries. Skull: Normal. Negative for fracture or focal lesion. Sinuses/Orbits: The paranasal sinuses and left mastoid air cells are normal. The middle ears are well aerated. There is fluid in scattered right mastoid air cells with no overlying soft tissue abnormalities or bony erosion. Other: Significance scalp thickening diffusely, centered to the right. High attenuation within the scout thickening is consistent with acute blood products. Visualized portions of the globes are. Soft tissues otherwise normal. IMPRESSION: 1. Marked scalp edema and hematoma consistent with history. No acute intracranial abnormality identified. Electronically Signed   By: Dorise Bullion III M.D   On: 10/06/2016 13:35   Mr Brain Wo Contrast  Result Date: 10/06/2016 CLINICAL DATA:  Multiple falls.  Hallucinations. EXAM: MRI HEAD WITHOUT CONTRAST TECHNIQUE: Multiplanar, multiecho pulse sequences of the brain and surrounding structures were obtained without intravenous contrast. COMPARISON:  Head CT same day FINDINGS: The study suffers from considerable motion degradation. Brain: Diffusion imaging does not show any acute or subacute infarction. The brainstem and cerebellum are normal. Cerebral hemispheres show abnormal T2 and FLAIR signal in the deep white matter most consistent with chronic small vessel disease. No cortical or large vessel territory infarction. No mass lesion. No sign of intracranial hemorrhage. No hydrocephalus or extra-axial collection. Vascular: Major vessels at the base of the brain show flow. Skull and upper cervical spine: Somewhat heterogeneous marrow pattern. This may be due to chronic renal disease. Sinuses/Orbits: Sinuses are negative. There is mastoid effusion on the right.  Other: Profound diffuse scalp swelling presumably post traumatic. IMPRESSION: No acute intracranial finding. Chronic small-vessel ischemic change of  the deep white matter. Diffuse soft tissue swelling of the scalp, presumably post traumatic. Heterogeneous marrow pattern of the calvarium, presumed secondary to chronic renal disease. Electronically Signed   By: Nelson Chimes M.D.   On: 10/06/2016 15:54   Dg Chest Portable 1 View  Result Date: 10/06/2016 CLINICAL DATA:  Patient presents to the ED with multiple falls. Patient states, "I fell 14 times today." Patient is complaining of right knee pain and right hip pain. Pt asleep during all x-ray exams; pt poor historian at time of x-rays. Hx/o COPD. Dialysis pt. EXAM: PORTABLE CHEST 1 VIEW COMPARISON:  09/15/2016 FINDINGS: Mild enlarged cardiac silhouette. There is mild diffuse interstitial pattern. No focal infiltrate. No pneumothorax. No evidence of thoracic trauma. IMPRESSION: 1. No evidence of thoracic trauma. 2. Mild interstitial edema pattern Electronically Signed   By: Suzy Bouchard M.D.   On: 10/06/2016 16:23   Dg Knee Complete 4 Views Left  Result Date: 10/06/2016 CLINICAL DATA:  Multiple falls. EXAM: LEFT KNEE - COMPLETE 4+ VIEW COMPARISON:  None. FINDINGS: No evidence of fracture, dislocation, or joint effusion. No evidence of arthropathy or other focal bone abnormality. Soft tissues are unremarkable. IMPRESSION: No fracture or dislocation. Electronically Signed   By: Suzy Bouchard M.D.   On: 10/06/2016 16:20   Dg Knee Complete 4 Views Right  Result Date: 10/06/2016 CLINICAL DATA:  Patient presents to the ED with multiple falls. Patient states, "I fell 14 times today." Patient is complaining of right knee pain and right hip pain. Pt asleep during all x-ray exams; pt poor historian at time of x-rays. Hx/o COPD. Dialysis pt. EXAM: RIGHT KNEE - COMPLETE 4+ VIEW COMPARISON:  None. FINDINGS: No fracture of the proximal tibia or distal femur.  Patella is normal. No joint effusion. IMPRESSION: No fracture or dislocation. Electronically Signed   By: Suzy Bouchard M.D.   On: 10/06/2016 16:22     ASSESSMENT AND PLAN:   * Acute on chronic hypercarbic respiratory failure due to COPD exacerbation Improving. BiPAP at night. Nebulizers. Steroids.  * Acute encephalopathy due to hypercarbia and cocaine Improving  * Periorbital hematoma, bilateral Ophthalmology consulted.  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 Continue amlodipine, losartan and Coreg. Add oral hydralazine.  * End-stage renal disease On hemodialysis  All the records are reviewed and case discussed with Care Management/Social Workerr. Management plans discussed with the patient, family and they are in agreement.  CODE STATUS: FULL CODE  DVT Prophylaxis: SCDs  TOTAL TIME TAKING CARE OF THIS PATIENT: 35 minutes.   POSSIBLE D/C IN 1-2 DAYS, DEPENDING ON CLINICAL CONDITION.  Hillary Bow R M.D on 10/08/2016 at 12:06 PM  Between 7am to 6pm - Pager - [phone]  After 6pm go to [email or web address] - password [email or web address] [facility]  Office  [phone]  CC: Primary care physician; [name]  Note: This dictation was prepared with Dragon dictation along with smaller phrase technology. Any transcriptional errors that result from this process are unintentional.

## 2016-10-08 NOTE — Progress Notes (Signed)
Pre hd assessment  
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
## 2016-10-09 NOTE — Progress Notes (Signed)
[facility] [place] NAME: Michele Nash    MR#:  [id number]  DATE OF BIRTH:  [birth year]  SUBJECTIVE: Patient is seen today, she is a snoring at this time. Still has a significant swelling of the lids. Seen by ophthalmology, recommended ice packs/to the lids every hour.   CHIEF COMPLAINT:   Chief Complaint  Patient presents with  . Fall   Patient admitted for fall and hypercarbic respiratory failure. Now transferred out of ICU. Unable to open eyes due to periorbital hematomas. Seen in hemodialysis. On 3 L oxygen.  REVIEW OF SYSTEMS:    Review of Systems  Constitutional: Positive for malaise/fatigue. Negative for chills and fever.  HENT: Negative for sore throat.   Eyes: Positive for pain. Negative for blurred vision and double vision.  Respiratory: Positive for cough and shortness of breath. Negative for hemoptysis and wheezing.   Cardiovascular: Negative for chest pain, palpitations, orthopnea and leg swelling.  Gastrointestinal: Negative for abdominal pain, constipation, diarrhea, heartburn, nausea and vomiting.  Genitourinary: Negative for dysuria and hematuria.  Musculoskeletal: Positive for back pain. Negative for joint pain.  Skin: Negative for rash.  Neurological: Positive for weakness. Negative for sensory change, speech change, focal weakness and headaches.  Endo/Heme/Allergies: Does not bruise/bleed easily.  Psychiatric/Behavioral: Negative for depression. The patient is not nervous/anxious.     DRUG ALLERGIES:   Allergies  Allergen Reactions  . Chantix [Varenicline]     hallucinations  . Sulfa Antibiotics Itching, Swelling, Rash and Other (See Comments)    Reaction:  Facial/body swelling     VITALS:  Blood pressure 126/66, pulse 65, temperature 98.7 F (37.1 C), temperature source Oral, resp. rate 20, height 5\' 5"  (1.651 m), weight 62.3 kg (137 lb 5.6 oz), SpO2 97 %.  PHYSICAL EXAMINATION:   Physical Exam  GENERAL:   60 y.o.-year-old patient lying in the bed with no acute distress. Drowzy EYES: Pupils equal, round, reactive to light and accommodation. No scleral icterus. Extraocular muscles intact.  HEENT: Bilateral periorbital hematomas. Oropharynx and nasopharynx clear. Pupils reactive to light. Conjunctival hemorrhage Right.swollen eye lids,. NECK:  Supple, no jugular venous distention. No thyroid enlargement, no tenderness.  LUNGS: Normal breath sounds bilaterally, no wheezing, rales, rhonchi. No use of accessory muscles of respiration.  CARDIOVASCULAR: S1, S2 normal. No murmurs, rubs, or gallops.  ABDOMEN: Soft, nontender, nondistended. Bowel sounds present. No organomegaly or mass.  EXTREMITIES: No cyanosis, clubbing or edema b/l.    NEUROLOGIC: Cranial nerves II through XII are intact. No focal Motor or sensory deficits b/l.   PSYCHIATRIC: The patient is drowzy SKIN: No obvious rash, lesion, or ulcer.   LABORATORY PANEL:   CBC  Recent Labs Lab 10/07/16 0801  WBC 9.9  HGB 11.8*  HCT 38.2  PLT 301   ------------------------------------------------------------------------------------------------------------------ Chemistries   Recent Labs Lab 10/06/16 1428 10/07/16 0801  NA 139 138  K 3.7 4.1  CL 100* 97*  CO2 30 28  GLUCOSE 177* 150*  BUN 29* 42*  CREATININE 6.47* 8.26*  CALCIUM 8.4* 8.4*  MG  --  1.8  AST 33  --   ALT 26  --   ALKPHOS 151*  --   BILITOT 0.7  --    ------------------------------------------------------------------------------------------------------------------  Cardiac Enzymes No results for input(s): TROPONINI in the last 168 hours. ------------------------------------------------------------------------------------------------------------------  RADIOLOGY:  Ct Head Wo Contrast  Result Date: 10/07/2016 CLINICAL DATA:  Sharp headache and increased orbital swelling status post fall. EXAM: CT HEAD WITHOUT CONTRAST  TECHNIQUE: Contiguous axial images  were obtained from the base of the skull through the vertex without intravenous contrast. COMPARISON:  CT of the head and MRI of the head 10/06/2016 FINDINGS: Brain: No evidence of acute infarction, hemorrhage, hydrocephalus, extra-axial collection or mass lesion/mass effect. Chronic small vessel ischemic changes are seen in the periventricular white matter. Vascular: No hyperdense vessels. Calcific atherosclerotic disease at the skullbase. Skull: Negative for fracture or focal lesion. There is a marked predominantly right-sided scalp swelling, which extends to the preseptal right more than left orbits. Sinuses/Orbits: No acute finding. Other: Marked predominantly right-sided scalp hematoma/edema, which now extends to the preseptal right more than left orbits. Right occipital scalp subcutaneous nodule may represent a dermal lesion. IMPRESSION: No evidence of acute intracranial abnormality. Enlargement of previously seen predominantly right-sided mark scalp hematoma/ edema, which now extends to right more than left preseptal orbits. Electronically Signed   By: Fidela Salisbury M.D.   On: 10/07/2016 19:24     ASSESSMENT AND PLAN:   * Acute on chronic hypercarbic respiratory failure due to COPD exacerbation Improving. BiPAP at night. Nebulizers. Steroids. No wheezing today. Monitor today, likely discharge tomorrow. * Acute encephalopathy due to hypercarbia and cocaine Improving  * Periorbital hematoma, bilateral Ophthalmology consulted.  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 Continue amlodipine, losartan and Coreg. Add oral hydralazine.  * End-stage renal disease On hemodialysis  All the records are reviewed and case discussed with Care Management/Social Workerr. Management plans discussed with the patient, family and they are in agreement.  CODE STATUS: FULL CODE  DVT Prophylaxis: SCDs  TOTAL TIME TAKING CARE OF THIS PATIENT: 35 minutes.    POSSIBLE D/C IN 1-2 DAYS, DEPENDING ON CLINICAL CONDITION.  [name] M.D on 10/09/2016 at 1:36 PM  Between 7am to 6pm - Pager - [phone]  After 6pm go to [email or web address] - password [email or web address] [facility]  Office  [phone]  CC: Primary care physician; [name]  Note: This dictation was prepared with Dragon dictation along with smaller phrase technology. Any transcriptional errors that result from this process are unintentional.

## 2016-10-09 NOTE — Progress Notes (Signed)
Pt alert and sitting up in bed. Pt expressed that although eyes are swollen they are doing better. She can open one eye now. CH offered prayer.   10/09/16 1120  Clinical Encounter Type  Visited With Patient  Visit Type Initial  Referral From Nurse  Spiritual Encounters  Spiritual Needs Prayer;Emotional  Stress Factors  Patient Stress Factors Health changes

## 2016-10-09 NOTE — Care Management (Signed)
Patient admitted for Acute on chronic hypercarbic respiratory failure due to COPD exacerbation.  Patient cocaine positive this admission.  Patient lives at home with her husband.  Patient HD Patient.  [facility].  Chronic O2 through [facility].  Informed for her husband to bring portable tank for discharge.  Obtains medications from [facility] on [address].  PT has assessed patient and recommended home health PT.  Patient would benefit from home health PT, RN, and SW in the home.  Patient will also need RW at time of discharge.  Patient was offered home health agency preference.  Patient states that she does not have a preference.  Heads up referral to [facility] with [place].  RNCM following

## 2016-10-09 NOTE — Care Management Important Message (Signed)
Important Message  Patient Details  Name: ANNELY AURINGER MRN: [id number] Date of Birth: [birth year]   Medicare Important Message Given:  Yes    Beverly Sessions, RN 10/09/2016, 4:02 PM

## 2016-10-09 NOTE — Progress Notes (Signed)
Subjective:   Hemodialysis yesterday. Tolerated treatment well. UF of 1.5 litres  She is able to open her eyes more today. States her pain is well controlled.   Objective:  Vital signs in last 24 hours:  Temp:  [97.7 F (36.5 C)-98.7 F (37.1 C)] 98.7 F (37.1 C) (11/14 0403) Pulse Rate:  [61-83] 65 (11/14 0609) Resp:  [16-22] 20 (11/14 0403) BP: (117-203)/(54-117) 126/66 (11/14 1013) SpO2:  [93 %-100 %] 97 % (11/14 0748) FiO2 (%):  [28 %] 28 % (11/14 0748) Weight:  [58.7 kg (129 lb 6.6 oz)-62.3 kg (137 lb 5.6 oz)] 62.3 kg (137 lb 5.6 oz) (11/14 0500)  Weight change: 2.548 kg (5 lb 9.9 oz) Filed Weights   10/08/16 1115 10/08/16 1450 10/09/16 0500  Weight: 60.2 kg (132 lb 11.5 oz) 58.7 kg (129 lb 6.6 oz) 62.3 kg (137 lb 5.6 oz)    Intake/Output:    Intake/Output Summary (Last 24 hours) at 10/09/16 1145 Last data filed at 10/09/16 0900  Gross per 24 hour  Intake              360 ml  Output             1750 ml  Net            -1390 ml     Physical Exam: General: Ill appearing  HEENT Bilateral periorbital ecchymosis  Neck distended neck veins  Pulm/lungs clear  CVS/Heart S1S2 no rubs, regular  Abdomen:  Soft, nontender, BS present  Extremities: No LE edema  Neurologic: Alert, oriented to time, person, place  Skin: No acute rashes  Access: Left arm AV fistula       Basic Metabolic Panel:   Recent Labs Lab 10/06/16 1428 10/07/16 0801 10/08/16 1120  NA 139 138  --   K 3.7 4.1  --   CL 100* 97*  --   CO2 30 28  --   GLUCOSE 177* 150*  --   BUN 29* 42*  --   CREATININE 6.47* 8.26*  --   CALCIUM 8.4* 8.4*  --   MG  --  1.8  --   PHOS  --  5.9* 4.7*     CBC:  Recent Labs Lab 10/06/16 1428 10/07/16 0801  WBC 7.9 9.9  NEUTROABS 5.5  --   HGB 12.3 11.8*  HCT 40.0 38.2  MCV 96.9 98.3  PLT 288 301      Microbiology:  Recent Results (from the past 720 hour(s))  MRSA PCR Screening     Status: None   Collection Time: 09/14/16  5:52 PM   Result Value Ref Range Status   MRSA by PCR NEGATIVE NEGATIVE Final    Comment:        The GeneXpert MRSA Assay (FDA approved for NASAL specimens only), is one component of a comprehensive MRSA colonization surveillance program. It is not intended to diagnose MRSA infection nor to guide or monitor treatment for MRSA infections.   Urine culture     Status: None   Collection Time: 10/06/16  5:17 PM  Result Value Ref Range Status   Specimen Description URINE, RANDOM  Final   Special Requests NONE  Final   Culture NO GROWTH Performed at [facility]   Final   Report Status 10/08/2016 FINAL  Final  MRSA PCR Screening     Status: None   Collection Time: 10/06/16 10:14 PM  Result Value Ref Range Status   MRSA by PCR NEGATIVE NEGATIVE Final  Comment:        The GeneXpert MRSA Assay (FDA approved for NASAL specimens only), is one component of a comprehensive MRSA colonization surveillance program. It is not intended to diagnose MRSA infection nor to guide or monitor treatment for MRSA infections.     Coagulation Studies: No results for input(s): LABPROT, INR in the last 72 hours.  Urinalysis:  Recent Labs  10/06/16 1656  COLORURINE STRAW*  LABSPEC 1.003*  PHURINE 8.0  GLUCOSEU >500*  HGBUR 1+*  BILIRUBINUR NEGATIVE  KETONESUR NEGATIVE  PROTEINUR 100*  NITRITE NEGATIVE  LEUKOCYTESUR 1+*      Imaging: Ct Head Wo Contrast  Result Date: 10/07/2016 CLINICAL DATA:  Sharp headache and increased orbital swelling status post fall. EXAM: CT HEAD WITHOUT CONTRAST TECHNIQUE: Contiguous axial images were obtained from the base of the skull through the vertex without intravenous contrast. COMPARISON:  CT of the head and MRI of the head 10/06/2016 FINDINGS: Brain: No evidence of acute infarction, hemorrhage, hydrocephalus, extra-axial collection or mass lesion/mass effect. Chronic small vessel ischemic changes are seen in the periventricular white matter. Vascular:  No hyperdense vessels. Calcific atherosclerotic disease at the skullbase. Skull: Negative for fracture or focal lesion. There is a marked predominantly right-sided scalp swelling, which extends to the preseptal right more than left orbits. Sinuses/Orbits: No acute finding. Other: Marked predominantly right-sided scalp hematoma/edema, which now extends to the preseptal right more than left orbits. Right occipital scalp subcutaneous nodule may represent a dermal lesion. IMPRESSION: No evidence of acute intracranial abnormality. Enlargement of previously seen predominantly right-sided mark scalp hematoma/ edema, which now extends to right more than left preseptal orbits. Electronically Signed   By: Fidela Salisbury M.D.   On: 10/07/2016 19:24     Medications:    . amLODipine  10 mg Oral Daily  . budesonide (PULMICORT) nebulizer solution  0.25 mg Nebulization BID  . calcium acetate  1,334 mg Oral TID WC  . carvedilol  25 mg Oral BID WC  . cinacalcet  60 mg Oral Q breakfast  . feeding supplement (ENSURE ENLIVE)  237 mL Oral TID BM  . fluticasone  2 spray Each Nare Daily  . furosemide  40 mg Oral Q T,Th,S,Su  . hydrALAZINE  50 mg Oral Q8H  . ipratropium-albuterol  3 mL Nebulization TID  . losartan  100 mg Oral Daily  . multivitamin  1 tablet Oral QHS  . pantoprazole  40 mg Oral BID  . sertraline  50 mg Oral Daily   sodium chloride, acetaminophen, hydrALAZINE, HYDROcodone-acetaminophen, ondansetron (ZOFRAN) IV  Assessment/ Plan:  [name] with hypertension, hyperlipidemia, diastolic heart failure, hypertension, COPD, tobacco abuse, AOCD, SHPTH, LUE AVF, ESRD 02/2013, cocaine abuse Admitted on 10/06/2016 for Acute respiratory failure with hypercapnia (HCC) [J96.02] Traumatic hematoma of forehead, initial encounter [S00.83XA] Fall, sequela [W19.XXXS] Altered mental status, unspecified altered mental status type [R41.82]   [facility]  1. End-stage renal disease on  hemodialysis MWF. Tolerated treatment well yesterday. Plan for next treatment tomorrow.   2. Hypertension: at goal -  amlodipine, carvedilol, hydralazine - avoid beta blockers due to cocaine abuse  3. Anemia of chronic kidney disease: hemoglobin 11.8 - hold epo  4. Secondary hyperparathyroidism. Outpatient PTH 774. Phosphorus and calcium at goal.  - calcium acetate with meals - Cinacalcet   LOS: [place], Dontavion [place] 11/14/201711:45 AM

## 2016-10-09 NOTE — Progress Notes (Signed)
PT Cancellation Note  Patient Details Name: Michele Nash MRN: [id number] DOB: September 12, 1956   Cancelled Treatment:    Reason Eval/Treat Not Completed: Patient's level of consciousness   Attempted session this am.  Pt would awaken for short periods before falling back asleep and unable to participate this am.   Chesley Noon 10/09/2016, 9:19 AM

## 2016-10-09 NOTE — Plan of Care (Signed)
Problem: Safety: Goal: Ability to remain free from injury will improve Outcome: Progressing Educated patient on the need to call for assistance when needing to use the bathroom. She indicated that she understood
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
## 2016-10-09 NOTE — Care Management (Signed)
Notified [facility] Patient [facility]ways Liaison of admission

## 2016-10-10 LAB — RENAL FUNCTION PANEL
ALBUMIN: 3.3 g/dL — AB (ref 3.5–5.0)
ANION GAP: 10 (ref 5–15)
BUN: 56 mg/dL — AB (ref 6–20)
CALCIUM: 7.6 mg/dL — AB (ref 8.9–10.3)
CO2: 31 mmol/L (ref 22–32)
Chloride: 96 mmol/L — ABNORMAL LOW (ref 101–111)
Creatinine, Ser: 9.02 mg/dL — ABNORMAL HIGH (ref 0.44–1.00)
GFR calc Af Amer: 5 mL/min — ABNORMAL LOW (ref >60)
GFR, EST NON AFRICAN AMERICAN: 4 mL/min — AB (ref >60)
GLUCOSE: 103 mg/dL — AB (ref 65–99)
PHOSPHORUS: 3.4 mg/dL (ref 2.5–4.6)
POTASSIUM: 4.1 mmol/L (ref 3.5–5.1)
SODIUM: 137 mmol/L (ref 135–145)

## 2016-10-10 LAB — BLOOD GAS, VENOUS
ACID-BASE EXCESS: 4.7 mmol/L — AB (ref 0.0–2.0)
BICARBONATE: 34.1 mmol/L — AB (ref 20.0–28.0)
Delivery systems: POSITIVE
FIO2: 28
LHR: 16 {breaths}/min
PATIENT TEMPERATURE: 37
pCO2, Ven: 76 mmHg (ref 44.0–60.0)
pH, Ven: 7.26 (ref 7.250–7.430)

## 2016-10-10 LAB — CBC
HEMATOCRIT: 32.1 % — AB (ref 35.0–47.0)
Hemoglobin: 10.3 g/dL — ABNORMAL LOW (ref 12.0–16.0)
MCH: 30.7 pg (ref 26.0–34.0)
MCHC: 32 g/dL (ref 32.0–36.0)
MCV: 95.9 fL (ref 80.0–100.0)
Platelets: 254 10*3/uL (ref 150–440)
RBC: 3.35 MIL/uL — ABNORMAL LOW (ref 3.80–5.20)
RDW: 17.7 % — AB (ref 11.5–14.5)
WBC: 5.5 10*3/uL (ref 3.6–11.0)

## 2016-10-10 MED ORDER — DIPHENHYDRAMINE HCL 25 MG PO CAPS
25.0000 mg | ORAL_CAPSULE | Freq: Three times a day (TID) | ORAL | Status: DC | PRN
  Administered 2016-10-10: 25 mg via ORAL
  Filled 2016-10-10: qty 1

## 2016-10-10 MED ORDER — ARTIFICIAL TEARS OP OINT
TOPICAL_OINTMENT | Freq: Four times a day (QID) | OPHTHALMIC | Status: DC
  Administered 2016-10-10: 1 via OPHTHALMIC
  Administered 2016-10-10 – 2016-10-11 (×2): via OPHTHALMIC
  Filled 2016-10-10: qty 3.5

## 2016-10-10 NOTE — Progress Notes (Signed)
Called Dr. Estanislado Pandy regarding benadryl per patient request.  Appropriate orders were placed for 25 mg of benadryl po q8 prn.  Christene Slates 10/10/2016  7:51 AM

## 2016-10-10 NOTE — Progress Notes (Signed)
HD initiated without issue. Pt has no complaints. Given warm blankets.

## 2016-10-10 NOTE — Progress Notes (Signed)
Pre dialysis  

## 2016-10-10 NOTE — Plan of Care (Signed)
Problem: Fluid Volume: Goal: Ability to maintain a balanced intake and output will improve Educated the patient on the importance of adhering to her prescribed diet, she indicated understanding

## 2016-10-10 NOTE — Progress Notes (Signed)
Pre dialysis assessment 
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
## 2016-10-10 NOTE — Progress Notes (Signed)
Michele Nash at [place] NAME: Michele Nash    MR#:  [id number]  DATE OF BIRTH:  Jul 07, 1956  SUBJECTIVE. Seen today, very alert, awake. Still has some swelling of the lids and eyes are very red.   CHIEF COMPLAINT:   Chief Complaint  Patient presents with  . Fall   Patient admitted for fall and hypercarbic respiratory failure. Now transferred out of ICU. Unable to open eyes due to periorbital hematomas. Seen in hemodialysis. On 3 L oxygen.  REVIEW OF SYSTEMS:    Review of Systems  Constitutional: Negative for chills, fever and malaise/fatigue.  HENT: Negative for sore throat.   Eyes: Positive for pain. Negative for blurred vision and double vision.  Respiratory: Positive for shortness of breath. Negative for cough, hemoptysis and wheezing.   Cardiovascular: Negative for chest pain, palpitations, orthopnea and leg swelling.  Gastrointestinal: Negative for abdominal pain, constipation, diarrhea, heartburn, nausea and vomiting.  Genitourinary: Negative for dysuria and hematuria.  Musculoskeletal: Positive for back pain. Negative for joint pain.  Skin: Negative for rash.  Neurological: Positive for weakness. Negative for sensory change, speech change, focal weakness and headaches.  Endo/Heme/Allergies: Does not bruise/bleed easily.  Psychiatric/Behavioral: Negative for depression. The patient is not nervous/anxious.     DRUG ALLERGIES:   Allergies  Allergen Reactions  . Chantix [Varenicline]     hallucinations  . Sulfa Antibiotics Itching, Swelling, Rash and Other (See Comments)    Reaction:  Facial/body swelling     VITALS:  Blood pressure 140/66, pulse 70, temperature 98.1 F (36.7 C), temperature source Oral, resp. rate 20, height 5\' 5"  (1.651 m), weight 64.1 kg (141 lb 5 oz), SpO2 99 %.  PHYSICAL EXAMINATION:   Physical Exam  GENERAL:  60 y.o.-year-old patient lying in the bed with no acute distress. Drowzy EYES: Pupils equal,  round, reactive to light and accommodation. No scleral icterus. Extraocular muscles intact.  HEENT: Bilateral periorbital hematomas. Oropharynx and nasopharynx clear. Pupils reactive to light. Conjunctival hemorrhage Right.swollen eye lids,. NECK:  Supple, no jugular venous distention. No thyroid enlargement, no tenderness.  LUNGS: Normal breath sounds bilaterally, no wheezing, rales, rhonchi. No use of accessory muscles of respiration.  CARDIOVASCULAR: S1, S2 normal. No murmurs, rubs, or gallops.  ABDOMEN: Soft, nontender, nondistended. Bowel sounds present. No organomegaly or mass.  EXTREMITIES: No cyanosis, clubbing or edema b/l.    NEUROLOGIC: Cranial nerves II through XII are intact. No focal Motor or sensory deficits b/l.   PSYCHIATRIC: The patient is drowzy SKIN: No obvious rash, lesion, or ulcer.   LABORATORY PANEL:   CBC  Recent Labs Lab 10/07/16 0801  WBC 9.9  HGB 11.8*  HCT 38.2  PLT 301   ------------------------------------------------------------------------------------------------------------------ Chemistries   Recent Labs Lab 10/06/16 1428 10/07/16 0801  NA 139 138  K 3.7 4.1  CL 100* 97*  CO2 30 28  GLUCOSE 177* 150*  BUN 29* 42*  CREATININE 6.47* 8.26*  CALCIUM 8.4* 8.4*  MG  --  1.8  AST 33  --   ALT 26  --   ALKPHOS 151*  --   BILITOT 0.7  --    ------------------------------------------------------------------------------------------------------------------  Cardiac Enzymes No results for input(s): TROPONINI in the last 168 hours. ------------------------------------------------------------------------------------------------------------------  RADIOLOGY:  No results found.   ASSESSMENT AND PLAN:   * Acute on chronic hypercarbic respiratory failure due to COPD exacerbation Improving. BiPAP at night. Nebulizers. Steroids. No wheezing today. Monitor today,*  Acute encephalopathy due to hypercarbia and cocaine  Improving  * Periorbital  hematoma, bilateral Ophthalmology consulted. Use ice compressions, I added artificial tears.  * Accelerated hypertension due to cocaine;;. Continue amlodipine, losartan and Coreg. Add oral hydralazine. BP is better.  * End-stage renal disease On hemodialysis, for hemodialysis today. We'll discharge tomorrow. Discussed with patient, registered nurse.  All the records are reviewed and case discussed with Care Management/Social Workerr. Management plans discussed with the patient, family and they are in agreement.  CODE STATUS: FULL CODE  DVT Prophylaxis: SCDs  TOTAL TIME TAKING CARE OF THIS PATIENT: 35 minutes.   POSSIBLE D/C IN 1-2 DAYS, DEPENDING ON CLINICAL CONDITION.  Michele Nash M.D on 10/10/2016 at 1:25 PM  Between 7am to 6pm - Pager - [phone]  After 6pm go to [email or web address] - password [email or web address] [facility]  Office  [phone]  CC: Primary care physician; Michele Nash  Note: This dictation was prepared with Dragon dictation along with smaller phrase technology. Any transcriptional errors that result from this process are unintentional.

## 2016-10-10 NOTE — Progress Notes (Signed)
Post HD assessment  

## 2016-10-10 NOTE — Progress Notes (Signed)
Called Dr. Estanislado Pandy regarding eye drops for irritated eyes per patient request.  Doctor wanted to order saline eye drops but pharmacy does not have that in stock.  Eye doctor will be consulting with patient in the morning.  Phoebe Sharps N  10/10/2016  3:08 AM

## 2016-10-10 NOTE — Progress Notes (Signed)
HD completed.pt tolerated well without issue. 1.5l goal met  

## 2016-10-11 MED ORDER — ARTIFICIAL TEARS OP OINT: TOPICAL_OINTMENT | Freq: Four times a day (QID) | OPHTHALMIC | 0 refills | Status: DC

## 2016-10-11 MED ORDER — ENSURE ENLIVE PO LIQD: 237.0000 mL | Freq: Three times a day (TID) | ORAL | 12 refills | Status: DC

## 2016-10-11 MED ORDER — BUDESONIDE 0.25 MG/2ML IN SUSP: 0.2500 mg | Freq: Two times a day (BID) | RESPIRATORY_TRACT | 12 refills | Status: DC

## 2016-10-11 NOTE — Progress Notes (Signed)
Subjective:   Hemodialysis yesterday. Tolerated treatment well. UF of 1.5 litres  Objective:  Vital signs in last 24 hours:  Temp:  [98 F (36.7 C)-98.8 F (37.1 C)] 98.7 F (37.1 C) (11/16 0510) Pulse Rate:  [62-73] 69 (11/16 0634) Resp:  [15-22] 20 (11/16 0510) BP: (109-166)/(46-86) 147/70 (11/16 0634) SpO2:  [99 %-100 %] 99 % (11/16 0739) Weight:  [64.4 kg (141 lb 15.6 oz)-66.3 kg (146 lb 2.6 oz)] 65.7 kg (144 lb 13.5 oz) (11/16 0531)  Weight change: 2.1 kg (4 lb 10.1 oz) Filed Weights   10/10/16 1742 10/11/16 0500 10/11/16 0531  Weight: 65 kg (143 lb 4.8 oz) 64.4 kg (141 lb 15.6 oz) 65.7 kg (144 lb 13.5 oz)    Intake/Output:    Intake/Output Summary (Last 24 hours) at 10/11/16 1046 Last data filed at 10/11/16 0544  Gross per 24 hour  Intake              465 ml  Output             1600 ml  Net            -1135 ml     Physical Exam: General: Ill appearing  HEENT Bilateral periorbital ecchymosis  Neck distended neck veins  Pulm/lungs clear  CVS/Heart S1S2 no rubs, regular  Abdomen:  Soft, nontender, BS present  Extremities: No LE edema  Neurologic: Alert, oriented to time, person, place  Skin: No acute rashes  Access: Left arm AV fistula       Basic Metabolic Panel:   Recent Labs Lab 10/06/16 1428 10/07/16 0801 10/08/16 1120 10/10/16 1435  NA 139 138  --  137  K 3.7 4.1  --  4.1  CL 100* 97*  --  96*  CO2 30 28  --  31  GLUCOSE 177* 150*  --  103*  BUN 29* 42*  --  56*  CREATININE 6.47* 8.26*  --  9.02*  CALCIUM 8.4* 8.4*  --  7.6*  MG  --  1.8  --   --   PHOS  --  5.9* 4.7* 3.4     CBC:  Recent Labs Lab 10/06/16 1428 10/07/16 0801 10/10/16 1435  WBC 7.9 9.9 5.5  NEUTROABS 5.5  --   --   HGB 12.3 11.8* 10.3*  HCT 40.0 38.2 32.1*  MCV 96.9 98.3 95.9  PLT 288 301 254      Microbiology:  Recent Results (from the past 720 hour(s))  MRSA PCR Screening     Status: None   Collection Time: 09/14/16  5:52 PM  Result Value Ref Range  Status   MRSA by PCR NEGATIVE NEGATIVE Final    Comment:        The GeneXpert MRSA Assay (FDA approved for NASAL specimens only), is one component of a comprehensive MRSA colonization surveillance program. It is not intended to diagnose MRSA infection nor to guide or monitor treatment for MRSA infections.   Urine culture     Status: None   Collection Time: 10/06/16  5:17 PM  Result Value Ref Range Status   Specimen Description URINE, RANDOM  Final   Special Requests NONE  Final   Culture NO GROWTH Performed at [facility]   Final   Report Status 10/08/2016 FINAL  Final  MRSA PCR Screening     Status: None   Collection Time: 10/06/16 10:14 PM  Result Value Ref Range Status   MRSA by PCR NEGATIVE NEGATIVE Final  Comment:        The GeneXpert MRSA Assay (FDA approved for NASAL specimens only), is one component of a comprehensive MRSA colonization surveillance program. It is not intended to diagnose MRSA infection nor to guide or monitor treatment for MRSA infections.     Coagulation Studies: No results for input(s): LABPROT, INR in the last 72 hours.  Urinalysis: No results for input(s): COLORURINE, LABSPEC, PHURINE, GLUCOSEU, HGBUR, BILIRUBINUR, KETONESUR, PROTEINUR, UROBILINOGEN, NITRITE, LEUKOCYTESUR in the last 72 hours.  Invalid input(s): APPERANCEUR    Imaging: No results found.   Medications:    . amLODipine  10 mg Oral Daily  . artificial tears   Both Eyes QID  . budesonide (PULMICORT) nebulizer solution  0.25 mg Nebulization BID  . calcium acetate  1,334 mg Oral TID WC  . carvedilol  25 mg Oral BID WC  . cinacalcet  60 mg Oral Q breakfast  . feeding supplement (ENSURE ENLIVE)  237 mL Oral TID BM  . fluticasone  2 spray Each Nare Daily  . furosemide  40 mg Oral Q T,Th,S,Su  . hydrALAZINE  50 mg Oral Q8H  . ipratropium-albuterol  3 mL Nebulization TID  . losartan  100 mg Oral Daily  . multivitamin  1 tablet Oral QHS  . pantoprazole  40  mg Oral BID  . sertraline  50 mg Oral Daily   sodium chloride, acetaminophen, diphenhydrAMINE, hydrALAZINE, HYDROcodone-acetaminophen, ondansetron (ZOFRAN) IV  Assessment/ Plan:  [name] with hypertension, hyperlipidemia, diastolic heart failure, hypertension, COPD, tobacco abuse, AOCD, SHPTH, LUE AVF, ESRD 02/2013, cocaine abuse Admitted on 10/06/2016 for Acute respiratory failure with hypercapnia (HCC) [J96.02] Traumatic hematoma of forehead, initial encounter [S00.83XA] Fall, sequela [W19.XXXS] Altered mental status, unspecified altered mental status type [R41.82]   [facility]/MWF  1. End-stage renal disease on hemodialysis MWF. Hemodialysis yesterday. Tolerated well.  2. Hypertension: at goal -  amlodipine, carvedilol, hydralazine  3. Anemia of chronic kidney disease:   - hold epo  4. Secondary hyperparathyroidism. Outpatient PTH 774. Phosphorus and calcium at goal.  - calcium acetate with meals - Cinacalcet   LOS: [place], [place] 11/16/201710:46 AM

## 2016-10-11 NOTE — Progress Notes (Signed)
Physical Therapy Treatment Patient Details Name: Michele Nash MRN: [id number] DOB: Jan 07, 1956 Today's Date: 10/11/2016    History of Present Illness Pt is a 60 y/o F who presented to the ED with AMS following 10-14 falls at home over the past week. Patient states that she was doing well until she was started her on Chantix for smoking cessation. Upon arrival at ED pt had extensive bruising and swelling in her R forehead.  All imaging studies negative.  Her blood gas showed severe hypercarbia and hence she was placed on BiPAP  Her urine toxicology was positive for cocaine.  Pt's PMH includes COPD, dialysis patient.    PT Comments    Pt sitting edge of bed awaiting discharge but agrees to ambulate.  She was able to stand and ambulate 21' without assistive device and min guard.  Gait unsteady at times but no lob's.  She reports she does not have a walker at home and does not feel like she wants one.   Follow Up Recommendations  Home health PT     Equipment Recommendations       Recommendations for Other Services       Precautions / Restrictions Precautions Precautions: Fall;Other (comment) Precaution Comments: swelling down and not interfering with vision for gait today Restrictions Weight Bearing Restrictions: No    Mobility  Bed Mobility Overal bed mobility: Modified Independent                Transfers Overall transfer level: Modified independent Equipment used: None                Ambulation/Gait Ambulation/Gait assistance: Min guard Ambulation Distance (Feet): 80 Feet Assistive device: None Gait Pattern/deviations: Step-through pattern;Decreased step length - right;Decreased step length - left   Gait velocity interpretation: Below normal speed for age/gender General Gait Details: signigicant improvement.  pt reports feeling back to baseline.  no dizziness noted.   Stairs            Wheelchair Mobility    Modified Rankin (Stroke Patients  Only)       Balance Overall balance assessment: Modified Independent Sitting-balance support: Feet supported Sitting balance-Leahy Scale: Good     Standing balance support: No upper extremity supported Standing balance-Leahy Scale: Fair                      Cognition Arousal/Alertness: Awake/alert Behavior During Therapy: WFL for tasks assessed/performed Overall Cognitive Status: Within Functional Limits for tasks assessed                      Exercises      General Comments        Pertinent Vitals/Pain Pain Assessment: No/denies pain    Home Living                      Prior Function            PT Goals (current goals can now be found in the care plan section) Acute Rehab PT Goals Patient Stated Goal: to feel better and go home Progress towards PT goals: Progressing toward goals    Frequency    Min 2X/week      PT Plan      Co-evaluation             End of Session Equipment Utilized During Treatment: Gait belt;Oxygen Activity Tolerance: Patient tolerated treatment well Patient left: in bed;with call bell/phone within reach;with family/visitor  present     Time: [id number] PT Time Calculation (min) (ACUTE ONLY): 12 min  Charges:  $Gait Training: 8-22 mins                    G Codes:      Chesley Noon 10-22-16, 10:23 AM

## 2016-10-11 NOTE — Progress Notes (Signed)
[place] at [place] NAME: Michele Nash    MR#:  [id number]  DATE OF BIRTH:  [birth year]  Eager to go home today.  CHIEF COMPLAINT:   Chief Complaint  Patient presents with  . Fall   Patient admitted for fall and hypercarbic respiratory failure. Now transferred out of ICU. Unable to open eyes due to periorbital hematomas. Seen in hemodialysis. On 3 L oxygen.  REVIEW OF SYSTEMS:    Review of Systems  Constitutional: Negative for chills, fever and malaise/fatigue.  HENT: Negative for sore throat.   Eyes: Positive for pain. Negative for blurred vision and double vision.  Respiratory: Negative for cough, hemoptysis, shortness of breath and wheezing.   Cardiovascular: Negative for chest pain, palpitations, orthopnea and leg swelling.  Gastrointestinal: Negative for abdominal pain, constipation, diarrhea, heartburn, nausea and vomiting.  Genitourinary: Negative for dysuria and hematuria.  Musculoskeletal: Positive for back pain. Negative for joint pain.  Skin: Negative for rash.  Neurological: Positive for weakness. Negative for sensory change, speech change, focal weakness and headaches.  Endo/Heme/Allergies: Does not bruise/bleed easily.  Psychiatric/Behavioral: Negative for depression. The patient is not nervous/anxious.     DRUG ALLERGIES:   Allergies  Allergen Reactions  . Chantix [Varenicline]     hallucinations  . Sulfa Antibiotics Itching, Swelling, Rash and Other (See Comments)    Reaction:  Facial/body swelling     VITALS:  Blood pressure (!) 147/70, pulse 69, temperature 98.7 F (37.1 C), temperature source Oral, resp. rate 20, height 5\' 5"  (1.651 m), weight 65.7 kg (144 lb 13.5 oz), SpO2 99 %.  PHYSICAL EXAMINATION:   Physical Exam  GENERAL:  60 y.o.-year-old patient lying in the bed with no acute distress.  EYES: Pupils equal, round, reactive to light and accommodation. No scleral icterus. Extraocular muscles intact.   HEENT: feeling better,.decreased lid swelling. Oropharynx and nasopharynx clear. Pupils reactive to light. Conjunctival hemorrhage Right.swollen eye lids,. NECK:  Supple, no jugular venous distention. No thyroid enlargement, no tenderness.  LUNGS: Normal breath sounds bilaterally, no wheezing, rales, rhonchi. No use of accessory muscles of respiration.  CARDIOVASCULAR: S1, S2 normal. No murmurs, rubs, or gallops.  ABDOMEN: Soft, nontender, nondistended. Bowel sounds present. No organomegaly or mass.  EXTREMITIES: No cyanosis, clubbing or edema b/l.    NEUROLOGIC: Cranial nerves II through XII are intact. No focal Motor or sensory deficits b/l.   PSYCHIATRIC: The patient is drowzy SKIN: No obvious rash, lesion, or ulcer.   LABORATORY PANEL:   CBC  Recent Labs Lab 10/10/16 1435  WBC 5.5  HGB 10.3*  HCT 32.1*  PLT 254   ------------------------------------------------------------------------------------------------------------------ Chemistries   Recent Labs Lab 10/06/16 1428 10/07/16 0801 10/10/16 1435  NA 139 138 137  K 3.7 4.1 4.1  CL 100* 97* 96*  CO2 30 28 31   GLUCOSE 177* 150* 103*  BUN 29* 42* 56*  CREATININE 6.47* 8.26* 9.02*  CALCIUM 8.4* 8.4* 7.6*  MG  --  1.8  --   AST 33  --   --   ALT 26  --   --   ALKPHOS 151*  --   --   BILITOT 0.7  --   --    ------------------------------------------------------------------------------------------------------------------  Cardiac Enzymes No results for input(s): TROPONINI in the last 168 hours. ------------------------------------------------------------------------------------------------------------------  RADIOLOGY:  No results found.   ASSESSMENT AND PLAN:   * Acute on chronic hypercarbic respiratory failure due to COPD exacerbation Improving. BiPAP at night. Nebulizers. Steroids. No wheeziing.  Acute encephalopathy due to hypercarbia and cocaine Improving  * Periorbital hematoma,  bilateral Ophthalmology consulted. Use ice compressions, I added artificial tears. Lid edema improved a lot.advised to use cold compressions and artifical tears at discharge.  * Accelerated hypertension due to cocaine;;. Continue amlodipine, losartan and Coreg. Addded oral hydralazine. BP is better.  * End-stage renal disease On hemodialysis Discharge home today.  All the records are reviewed and case discussed with Care Management/Social Workerr. Management plans discussed with the patient, family and they are in agreement.  CODE STATUS: FULL CODE  DVT Prophylaxis: SCDs  TOTAL TIME TAKING CARE OF THIS PATIENT: 35 minutes.    [name] M.D on 10/11/2016 at 10:51 PM  Between 7am to 6pm - Pager - [phone]  After 6pm go to [email or web address] - password [email or web address] [facility]  Office  [phone]  CC: Primary care physician; [name]  Note: This dictation was prepared with Dragon dictation along with smaller phrase technology. Any transcriptional errors that result from this process are unintentional.

## 2016-10-11 NOTE — Progress Notes (Signed)
10/11/2016  11:12 AM  Delane Ginger to be D/C'd Home per MD order.  Discussed prescriptions and follow up appointments with the patient. Prescriptions given to patient, medication list explained in detail. Pt verbalized understanding.    Medication List    TAKE these medications   albuterol 108 (90 Base) MCG/ACT inhaler Commonly known as:  PROVENTIL HFA;VENTOLIN HFA Inhale 2 puffs into the lungs every 6 (six) hours as needed for wheezing or shortness of breath.   ALPRAZolam 0.5 MG tablet Commonly known as:  XANAX Take 0.5 mg by mouth 2 (two) times daily.   amLODipine 10 MG tablet Commonly known as:  NORVASC Take 10 mg by mouth daily.   artificial tears Oint ophthalmic ointment Place into both eyes 4 (four) times daily.   budesonide 0.25 MG/2ML nebulizer solution Commonly known as:  PULMICORT Take 2 mLs (0.25 mg total) by nebulization 2 (two) times daily.   calcium acetate 667 MG capsule Commonly known as:  PHOSLO Take 1,334 mg by mouth 3 (three) times daily.   calcium carbonate 600 MG Tabs tablet Commonly known as:  OS-CAL Take 600 mg by mouth 2 (two) times daily with a meal.   carvedilol 25 MG tablet Commonly known as:  COREG Take 1 tablet (25 mg total) by mouth 2 (two) times daily with a meal.   cinacalcet 30 MG tablet Commonly known as:  SENSIPAR Take 60 mg by mouth daily with breakfast.   ezetimibe 10 MG tablet Commonly known as:  ZETIA Take 10 mg by mouth daily.   feeding supplement (ENSURE ENLIVE) Liqd Take 237 mLs by mouth 3 (three) times daily between meals.   fluticasone 50 MCG/ACT nasal spray Commonly known as:  FLONASE Place 2 sprays into both nostrils daily as needed for rhinitis.   Fluticasone-Salmeterol 250-50 MCG/DOSE Aepb Commonly known as:  ADVAIR Inhale 1 puff into the lungs 2 (two) times daily.   Fluticasone-Salmeterol 100-50 MCG/DOSE Aepb Commonly known as:  ADVAIR Inhale 1 puff into the lungs 2 (two) times daily.   furosemide 40 MG  tablet Commonly known as:  LASIX Take 40 mg by mouth every Tuesday, Thursday, Saturday, and Sunday.   lidocaine-prilocaine cream Commonly known as:  EMLA Apply 1 application topically as needed (prior to accessing port).   losartan 100 MG tablet Commonly known as:  COZAAR Take 100 mg by mouth daily.   multivitamin Tabs tablet Take 1 tablet by mouth at bedtime.   omeprazole 20 MG capsule Commonly known as:  PRILOSEC Take 20 mg by mouth daily.   pantoprazole 40 MG tablet Commonly known as:  PROTONIX Take 1 tablet (40 mg total) by mouth 2 (two) times daily.   sertraline 50 MG tablet Commonly known as:  ZOLOFT Take 50 mg by mouth daily.       Vitals:   10/11/16 0531 10/11/16 0634  BP: (!) 163/69 (!) 147/70  Pulse:  69  Resp:    Temp:      Skin clean, dry and intact without evidence of skin break down, no evidence of skin tears noted. IV catheter discontinued intact. Site without signs and symptoms of complications. Dressing and pressure applied. Pt denies pain at this time. No complaints noted.  An After Visit Summary was printed and given to the patient. Patient escorted via [employer], and D/C home via private auto.  Dola Argyle

## 2016-10-11 NOTE — Care Management (Signed)
Patient to discharge home today.  Corene Cornea with [facility] notified of discharge.  Husband to bring portable O2 tank for discharge.  RNCM signing off.

## 2016-10-12 NOTE — Progress Notes (Signed)
[facility]  Patient Status: Closed, patient declined [place] services, CM Isaias Cowman made aware.    Michele Nash 10/12/2016, 3:28 PM

## 2016-10-16 ENCOUNTER — Emergency Department
Admission: EM | Admit: 2016-10-16 | Discharge: 2016-10-16 | Disposition: A | Discharge status: Home / Self Care | Payer: Medicare Other | Attending: Emergency Medicine | Admitting: Emergency Medicine

## 2016-10-16 ENCOUNTER — Emergency Department: Payer: Medicare Other

## 2016-10-16 DIAGNOSIS — I120 Hypertensive chronic kidney disease with stage 5 chronic kidney disease or end stage renal disease: Secondary | ICD-10-CM | POA: Diagnosis not present

## 2016-10-16 DIAGNOSIS — G9389 Other specified disorders of brain: Secondary | ICD-10-CM | POA: Insufficient documentation

## 2016-10-16 DIAGNOSIS — N186 End stage renal disease: Secondary | ICD-10-CM | POA: Insufficient documentation

## 2016-10-16 DIAGNOSIS — R220 Localized swelling, mass and lump, head: Secondary | ICD-10-CM

## 2016-10-16 DIAGNOSIS — Z79899 Other long term (current) drug therapy: Secondary | ICD-10-CM | POA: Diagnosis not present

## 2016-10-16 DIAGNOSIS — J449 Chronic obstructive pulmonary disease, unspecified: Secondary | ICD-10-CM | POA: Diagnosis not present

## 2016-10-16 DIAGNOSIS — Z992 Dependence on renal dialysis: Secondary | ICD-10-CM | POA: Diagnosis not present

## 2016-10-16 DIAGNOSIS — H05223 Edema of bilateral orbit: Secondary | ICD-10-CM | POA: Insufficient documentation

## 2016-10-16 DIAGNOSIS — Z87891 Personal history of nicotine dependence: Secondary | ICD-10-CM | POA: Insufficient documentation

## 2016-10-16 MED ORDER — GENTAMICIN SULFATE 0.3 % OP OINT
TOPICAL_OINTMENT | Freq: Three times a day (TID) | OPHTHALMIC | Status: DC
  Administered 2016-10-16: 1 via OPHTHALMIC
  Filled 2016-10-16: qty 3.5

## 2016-10-16 NOTE — ED Notes (Addendum)
Patient transported to CT 

## 2016-10-16 NOTE — ED Triage Notes (Signed)
Pt states she was admitted last week due to having 14 falls, pt returns today with c/o HA and orbital swelling to the point she is unable to see very well.

## 2016-10-16 NOTE — Discharge Instructions (Signed)
Please send upright at home to decrease the swelling around the eyes and apply ice or moist warm heat on the outside to help with swelling. Please contact your primary physician and continue with your dialysis today or tomorrow. CT of the head and face showed no new abnormalities. Please continue with the gentamicin ophthalmic ointment apply 3 times a day. Over-the-counter Tylenol for pain.  Please return immediately if condition worsens. Please contact her primary physician or the physician you were given for referral. If you have any specialist physicians involved in her treatment and plan please also contact them. Thank you for using [facility].

## 2016-10-16 NOTE — Discharge Summary (Signed)
Michele Nash, is a 60 y.o. female  DOB [birth year]  MRN [id number].  Admission date:  10/06/2016  Admitting Physician  Vilinda Boehringer, MD  Discharge Date:  10/11/2016   Primary MD  [name]  Recommendations for primary care physician for things to follow:   Follow up  With primary doctor in 1 week   Admission Diagnosis  Acute respiratory failure with hypercapnia (HCC) [J96.02] Traumatic hematoma of forehead, initial encounter [S00.83XA] Fall, sequela [W19.XXXS] Altered mental status, unspecified altered mental status type [R41.82]   Discharge Diagnosis  Acute respiratory failure with hypercapnia (HCC) [J96.02] Traumatic hematoma of forehead, initial encounter [S00.83XA] Fall, sequela [W19.XXXS] Altered mental status, unspecified altered mental status type [R41.82]    Active Problems:   Acute respiratory failure with hypercapnia (HCC)   Altered mental status   Fall   Traumatic hematoma of forehead      Past Medical History:  Diagnosis Date  . Anemia   . Apnea, sleep    for sleep study 10/17/15-no cpap yet  . Arthritis   . Bronchitis   . Chronic kidney disease   . COPD (chronic obstructive pulmonary disease) ([place])   . Depression   . Dialysis patient ([place]) 2014  . GERD (gastroesophageal reflux disease)   . Headache   . Hyperlipidemia   . Hypertension   . Obesity   . Renal dialysis device, implant, or graft complication    RIGHT CHEST CATH    Past Surgical History:  Procedure Laterality Date  . ABDOMINAL HYSTERECTOMY    . COLONOSCOPY  2011  . COLONOSCOPY WITH PROPOFOL N/A 09/20/2015   Procedure: COLONOSCOPY WITH PROPOFOL;  Surgeon: Lucilla Lame, MD;  Location: [facility] ENDOSCOPY;  Service: Endoscopy;  Laterality: N/A;  . DIALYSIS FISTULA CREATION    . ESOPHAGOGASTRODUODENOSCOPY N/A 07/13/2016    Procedure: ESOPHAGOGASTRODUODENOSCOPY (EGD);  Surgeon: Lollie Sails, MD;  Location: [facility] ENDOSCOPY;  Service: Endoscopy;  Laterality: N/A;  . ESOPHAGOGASTRODUODENOSCOPY (EGD) WITH PROPOFOL N/A 06/02/2016   Procedure: ESOPHAGOGASTRODUODENOSCOPY (EGD) WITH PROPOFOL;  Surgeon: Manya Silvas, MD;  Location: [facility] ENDOSCOPY;  Service: Endoscopy;  Laterality: N/A;  . PERIPHERAL VASCULAR CATHETERIZATION N/A 05/10/2015   Procedure: A/V Shuntogram/Fistulagram;  Surgeon: Katha Cabal, MD;  Location: [place] CV LAB;  Service: Cardiovascular;  Laterality: N/A;  . PERIPHERAL VASCULAR CATHETERIZATION Left 05/10/2015   Procedure: A/V Shunt Intervention;  Surgeon: Katha Cabal, MD;  Location: [place] CV LAB;  Service: Cardiovascular;  Laterality: Left;  . PERIPHERAL VASCULAR CATHETERIZATION Left 08/23/2015   Procedure: A/V Shuntogram/Fistulagram;  Surgeon: Katha Cabal, MD;  Location: [place] CV LAB;  Service: Cardiovascular;  Laterality: Left;  . PERIPHERAL VASCULAR CATHETERIZATION N/A 08/23/2015   Procedure: A/V Shunt Intervention;  Surgeon: Katha Cabal, MD;  Location: [place] CV LAB;  Service: Cardiovascular;  Laterality: N/A;  . PERIPHERAL VASCULAR CATHETERIZATION  08/23/2015   Procedure: Dialysis/Perma Catheter Insertion;  Surgeon: Katha Cabal, MD;  Location: [place] CV LAB;  Service: Cardiovascular;;  . PERIPHERAL VASCULAR CATHETERIZATION N/A 01/03/2016   Procedure: Dialysis/Perma Catheter Removal;  Surgeon: Katha Cabal, MD;  Location: [place] CV LAB;  Service: Cardiovascular;  Laterality: N/A;  . REVISON OF ARTERIOVENOUS FISTULA Left 10/28/2015   Procedure: REVISON OF ARTERIOVENOUS FISTULA WITH ARTEGRAFT;  Surgeon: Katha Cabal, MD;  Location: [facility] ORS;  Service: Vascular;  Laterality: Left;  . WOUND DEBRIDEMENT Left 10/28/2015   Procedure: Resection of shoulder cyst ( left );  Surgeon: Katha Cabal, MD;  Location: [facility] ORS;   Service: Vascular;  Laterality: Left;       History of present illness and  Hospital Course:     Kindly see H&P for history of present illness and admission details, please review complete Labs, Consult reports and Test reports for all details in brief  HPI  from the history and physical done on the day of admission 60 year old female patient admitted because of altered mental status, multiple falls. Admitted to intensive care unit. Patient started to have multiple falls after she started on Chantix for smoking cessation. Had some hallucinations a week after starting the medication. No loss of consciousness. Her blood gas showed severe hypercapnia, started on BiPAP, admitted to ICU.   Hospital Course  #1 acute on chronic hypercarbic respiratory failure: Patient was started on nebulizer, steroids, BiPAP. Patient mental status improved well. Patient moved out of intensive care unit on November 12.  #2 multiple falls after starting the Chantix. Patient had 14 falls in 1 day. Head CAT scan on admission did not show any intracranial hemorrhage, it showed right-sided HEMATOMA with extension to the left upper receptor orbits. No orbital fractures. Patient is seen by ophthalmology, patient complains of eye is swollen shut since she fell. Patient had no diplopia. She reported she could not see left eye secondary to lid  edema. MRI of the brain did not show any orbital fractures. Ophthalmology recommended ice packs to the list 10 minutes per hour for 2 days. Patient had eyelid ecchymosis, sub conjunctivae are hemorrhage of the left eye. Secondary to blunt trauma to the forehead and face. #3 ESRD, on hemodialysis Monday, Wednesday, Friday, seen by nephrology. Patient tolerated dialysis treatments in the hospital. Hypertensive crisis secondary to falls, hip pains, body pains. Patient is on amlodipine, Coreg, hydralazine. Urine toxicology came positive for cocaine. Blood Pressure was 203/83, improved with  blood pressure medications.  #4 multiple falls, physical therapy recommended home health physical therapy, discharged home with home physical therapy.  #5 anxiety:/depression; Continue Xanax,/Zoloft.  #6 history of COPD: No wheezing continue Pulmicort, albuterol /   Discharge Condition: stable   Follow UP  Follow-up Information    [name]. Go on 10/25/2016.   Why:  Thursday at 10:30am for hospital follow-up. If the office have a concellation on 11/28-Tuesday they will notify you. Contact information: [place] [address] [phone]             Discharge Instructions  and  Discharge Medications     Discharge Instructions    Face-to-face encounter (required for Medicare/Medicaid patients)    Complete by:  As directed    I [facility] certify that this patient is under my care and that I, or a nurse practitioner or physician's assistant working with me, had a face-to-face encounter that meets the physician face-to-face encounter requirements with this patient on 10/11/2016. The encounter with the patient was in whole, or in part for the following medical condition(s) which is the primary reason for home health care ESRD Falls htn   The encounter with the patient was in whole, or in part, for the following medical condition, which is the primary reason for home health care:  whole   I certify that, based on my findings, the following services are medically necessary home health services:   Nursing Physical therapy     Reason for Medically Necessary Home Health Services:  Therapy- Personnel officer, Training and development officer and Stair Training   My clinical findings support the need for the  above services:  Unable to leave home safely without assistance and/or assistive device   Further, I certify that my clinical findings support that this patient is homebound due to:  Unable to leave home safely without assistance   Home Health    Complete by:  As  directed    To provide the following care/treatments:   PT RN Social work         Medication List    TAKE these medications   albuterol 108 (90 Base) MCG/ACT inhaler Commonly known as:  PROVENTIL HFA;VENTOLIN HFA Inhale 2 puffs into the lungs every 6 (six) hours as needed for wheezing or shortness of breath.   ALPRAZolam 0.5 MG tablet Commonly known as:  XANAX Take 0.5 mg by mouth 2 (two) times daily.   amLODipine 10 MG tablet Commonly known as:  NORVASC Take 10 mg by mouth daily.   artificial tears Oint ophthalmic ointment Place into both eyes 4 (four) times daily.   budesonide 0.25 MG/2ML nebulizer solution Commonly known as:  PULMICORT Take 2 mLs (0.25 mg total) by nebulization 2 (two) times daily.   calcium acetate 667 MG capsule Commonly known as:  PHOSLO Take 1,334 mg by mouth 3 (three) times daily.   calcium carbonate 600 MG Tabs tablet Commonly known as:  OS-CAL Take 600 mg by mouth 2 (two) times daily with a meal.   carvedilol 25 MG tablet Commonly known as:  COREG Take 1 tablet (25 mg total) by mouth 2 (two) times daily with a meal.   cinacalcet 30 MG tablet Commonly known as:  SENSIPAR Take 60 mg by mouth daily with breakfast.   ezetimibe 10 MG tablet Commonly known as:  ZETIA Take 10 mg by mouth daily.   feeding supplement (ENSURE ENLIVE) Liqd Take 237 mLs by mouth 3 (three) times daily between meals.   fluticasone 50 MCG/ACT nasal spray Commonly known as:  FLONASE Place 2 sprays into both nostrils daily as needed for rhinitis.   Fluticasone-Salmeterol 250-50 MCG/DOSE Aepb Commonly known as:  ADVAIR Inhale 1 puff into the lungs 2 (two) times daily.   Fluticasone-Salmeterol 100-50 MCG/DOSE Aepb Commonly known as:  ADVAIR Inhale 1 puff into the lungs 2 (two) times daily.   furosemide 40 MG tablet Commonly known as:  LASIX Take 40 mg by mouth every Tuesday, Thursday, Saturday, and Sunday.   lidocaine-prilocaine cream Commonly known as:   EMLA Apply 1 application topically as needed (prior to accessing port).   losartan 100 MG tablet Commonly known as:  COZAAR Take 100 mg by mouth daily.   multivitamin Tabs tablet Take 1 tablet by mouth at bedtime.   omeprazole 20 MG capsule Commonly known as:  PRILOSEC Take 20 mg by mouth daily.   pantoprazole 40 MG tablet Commonly known as:  PROTONIX Take 1 tablet (40 mg total) by mouth 2 (two) times daily.   sertraline 50 MG tablet Commonly known as:  ZOLOFT Take 50 mg by mouth daily.         Diet and Activity recommendation: See Discharge Instructions above   Consults obtained -Pulmonary and critical care, physical therapy, nephrology, ophthalmology.   Major procedures and Radiology Reports - PLEASE review detailed and final reports for all details, in brief -     Dg Pelvis 1-2 Views  Result Date: 10/06/2016 CLINICAL DATA:  Multiple falls.  Right hip pain. EXAM: PELVIS - 1-2 VIEW COMPARISON:  None. FINDINGS: There is no evidence of pelvic fracture or diastasis. No pelvic bone lesions  are seen. IMPRESSION: 1. No evidence for hip fracture or dislocation. If there is high clinical suspicion for occult fracture or the patient refuses to weightbear, consider further evaluation with MRI. Although CT is expeditious, evidence is lacking regarding accuracy of CT over plain film radiography. Electronically Signed   By: Kerby Moors M.D.   On: 10/06/2016 16:23   Ct Head Wo Contrast  Result Date: 10/07/2016 CLINICAL DATA:  Sharp headache and increased orbital swelling status post fall. EXAM: CT HEAD WITHOUT CONTRAST TECHNIQUE: Contiguous axial images were obtained from the base of the skull through the vertex without intravenous contrast. COMPARISON:  CT of the head and MRI of the head 10/06/2016 FINDINGS: Brain: No evidence of acute infarction, hemorrhage, hydrocephalus, extra-axial collection or mass lesion/mass effect. Chronic small vessel ischemic changes are seen in the  periventricular white matter. Vascular: No hyperdense vessels. Calcific atherosclerotic disease at the skullbase. Skull: Negative for fracture or focal lesion. There is a marked predominantly right-sided scalp swelling, which extends to the preseptal right more than left orbits. Sinuses/Orbits: No acute finding. Other: Marked predominantly right-sided scalp hematoma/edema, which now extends to the preseptal right more than left orbits. Right occipital scalp subcutaneous nodule may represent a dermal lesion. IMPRESSION: No evidence of acute intracranial abnormality. Enlargement of previously seen predominantly right-sided mark scalp hematoma/ edema, which now extends to right more than left preseptal orbits. Electronically Signed   By: Fidela Salisbury M.D.   On: 10/07/2016 19:24   Ct Head Wo Contrast  Result Date: 10/06/2016 CLINICAL DATA:  Pain after multiple falls EXAM: CT HEAD WITHOUT CONTRAST TECHNIQUE: Contiguous axial images were obtained from the base of the skull through the vertex without intravenous contrast. COMPARISON:  None. FINDINGS: Brain: No subdural, epidural, or subarachnoid hemorrhage. Calcifications seen in the left basal ganglia of no acute significance. No acute cortical ischemia or infarct. The cerebellum and brainstem are normal. Basal cisterns are patent. Ventricles and sulci are normal for age. Vascular: Calcified atherosclerosis in the intracranial carotid arteries. Skull: Normal. Negative for fracture or focal lesion. Sinuses/Orbits: The paranasal sinuses and left mastoid air cells are normal. The middle ears are well aerated. There is fluid in scattered right mastoid air cells with no overlying soft tissue abnormalities or bony erosion. Other: Significance scalp thickening diffusely, centered to the right. High attenuation within the scout thickening is consistent with acute blood products. Visualized portions of the globes are. Soft tissues otherwise normal. IMPRESSION: 1.  Marked scalp edema and hematoma consistent with history. No acute intracranial abnormality identified. Electronically Signed   By: Dorise Bullion III M.D   On: 10/06/2016 13:35   Mr Brain Wo Contrast  Result Date: 10/06/2016 CLINICAL DATA:  Multiple falls.  Hallucinations. EXAM: MRI HEAD WITHOUT CONTRAST TECHNIQUE: Multiplanar, multiecho pulse sequences of the brain and surrounding structures were obtained without intravenous contrast. COMPARISON:  Head CT same day FINDINGS: The study suffers from considerable motion degradation. Brain: Diffusion imaging does not show any acute or subacute infarction. The brainstem and cerebellum are normal. Cerebral hemispheres show abnormal T2 and FLAIR signal in the deep white matter most consistent with chronic small vessel disease. No cortical or large vessel territory infarction. No mass lesion. No sign of intracranial hemorrhage. No hydrocephalus or extra-axial collection. Vascular: Major vessels at the base of the brain show flow. Skull and upper cervical spine: Somewhat heterogeneous marrow pattern. This may be due to chronic renal disease. Sinuses/Orbits: Sinuses are negative. There is mastoid effusion on the right. Other: Profound  diffuse scalp swelling presumably post traumatic. IMPRESSION: No acute intracranial finding. Chronic small-vessel ischemic change of the deep white matter. Diffuse soft tissue swelling of the scalp, presumably post traumatic. Heterogeneous marrow pattern of the calvarium, presumed secondary to chronic renal disease. Electronically Signed   By: Nelson Chimes M.D.   On: 10/06/2016 15:54   Dg Chest Portable 1 View  Result Date: 10/06/2016 CLINICAL DATA:  Patient presents to the ED with multiple falls. Patient states, "I fell 14 times today." Patient is complaining of right knee pain and right hip pain. Pt asleep during all x-ray exams; pt poor historian at time of x-rays. Hx/o COPD. Dialysis pt. EXAM: PORTABLE CHEST 1 VIEW COMPARISON:   09/15/2016 FINDINGS: Mild enlarged cardiac silhouette. There is mild diffuse interstitial pattern. No focal infiltrate. No pneumothorax. No evidence of thoracic trauma. IMPRESSION: 1. No evidence of thoracic trauma. 2. Mild interstitial edema pattern Electronically Signed   By: Suzy Bouchard M.D.   On: 10/06/2016 16:23   Dg Knee Complete 4 Views Left  Result Date: 10/06/2016 CLINICAL DATA:  Multiple falls. EXAM: LEFT KNEE - COMPLETE 4+ VIEW COMPARISON:  None. FINDINGS: No evidence of fracture, dislocation, or joint effusion. No evidence of arthropathy or other focal bone abnormality. Soft tissues are unremarkable. IMPRESSION: No fracture or dislocation. Electronically Signed   By: Suzy Bouchard M.D.   On: 10/06/2016 16:20   Dg Knee Complete 4 Views Right  Result Date: 10/06/2016 CLINICAL DATA:  Patient presents to the ED with multiple falls. Patient states, "I fell 14 times today." Patient is complaining of right knee pain and right hip pain. Pt asleep during all x-ray exams; pt poor historian at time of x-rays. Hx/o COPD. Dialysis pt. EXAM: RIGHT KNEE - COMPLETE 4+ VIEW COMPARISON:  None. FINDINGS: No fracture of the proximal tibia or distal femur. Patella is normal. No joint effusion. IMPRESSION: No fracture or dislocation. Electronically Signed   By: Suzy Bouchard M.D.   On: 10/06/2016 16:22    Micro Results     Recent Results (from the past 240 hour(s))  Urine culture     Status: None   Collection Time: 10/06/16  5:17 PM  Result Value Ref Range Status   Specimen Description URINE, RANDOM  Final   Special Requests NONE  Final   Culture NO GROWTH Performed at [facility]   Final   Report Status 10/08/2016 FINAL  Final  MRSA PCR Screening     Status: None   Collection Time: 10/06/16 10:14 PM  Result Value Ref Range Status   MRSA by PCR NEGATIVE NEGATIVE Final    Comment:        The GeneXpert MRSA Assay (FDA approved for NASAL specimens only), is one component of  a comprehensive MRSA colonization surveillance program. It is not intended to diagnose MRSA infection nor to guide or monitor treatment for MRSA infections.        Today   Subjective:   Michele Nash today has no headache,no chest abdominal pain,no new weakness tingling or numbness, feels much better wants to go home today.  Objective:   Blood pressure (!) 147/70, pulse 69, temperature 98.7 F (37.1 C), temperature source Oral, resp. rate 20, height 5\' 5"  (1.651 m), weight 65.7 kg (144 lb 13.5 oz), SpO2 99 %.  No intake or output data in the 24 hours ending 10/16/16 1130  Exam Awake Alert, Oriented x 3, No new F.N deficits, Normal affect .AT,PERRAL Supple Neck,No JVD, No cervical lymphadenopathy appriciated.  Symmetrical Chest wall movement, Good air movement bilaterally, CTAB RRR,No Gallops,Rubs or new Murmurs, No Parasternal Heave +ve B.Sounds, Abd Soft, Non tender, No organomegaly appriciated, No rebound -guarding or rigidity. No Cyanosis, Clubbing or edema, No new Rash or bruise  Data Review   CBC w Diff:  Lab Results  Component Value Date   WBC 5.5 10/10/2016   HGB 10.3 (L) 10/10/2016   HGB 11.4 (L) 10/10/2014   HCT 32.1 (L) 10/10/2016   HCT 33.8 (L) 10/10/2014   PLT 254 10/10/2016   PLT 304 10/10/2014   LYMPHOPCT 14 10/06/2016   LYMPHOPCT 19.9 06/15/2014   MONOPCT 14 10/06/2016   MONOPCT 10.2 06/15/2014   EOSPCT 1 10/06/2016   EOSPCT 4.4 06/15/2014   BASOPCT 1 10/06/2016   BASOPCT 1.0 06/15/2014    CMP:  Lab Results  Component Value Date   NA 137 10/10/2016   NA 141 10/10/2014   K 4.1 10/10/2016   K 3.7 10/10/2014   CL 96 (L) 10/10/2016   CL 96 (L) 10/10/2014   CO2 31 10/10/2016   CO2 37 (H) 10/10/2014   BUN 56 (H) 10/10/2016   BUN 40 (H) 10/10/2014   CREATININE 9.02 (H) 10/10/2016   CREATININE 7.68 (H) 10/10/2014   PROT 7.4 10/06/2016   PROT 7.0 10/10/2014   ALBUMIN 3.3 (L) 10/10/2016   ALBUMIN 3.3 (L) 10/10/2014   BILITOT 0.7  10/06/2016   BILITOT 0.3 10/10/2014   ALKPHOS 151 (H) 10/06/2016   ALKPHOS 84 10/10/2014   AST 33 10/06/2016   AST 16 10/10/2014   ALT 26 10/06/2016   ALT 20 10/10/2014  .   Total Time in preparing paper work, data evaluation and todays exam - 38 minutes  Leetta Hendriks M.D on 11/16 /2017 at 11:30 AM    Note: This dictation was prepared with Dragon dictation along with smaller phrase technology. Any transcriptional errors that result from this process are unintentional.

## 2016-10-16 NOTE — ED Provider Notes (Signed)
Time Seen: Approximately 1101 I have reviewed the triage notes  Chief Complaint: Facial Swelling   History of Present Illness: Michele Nash is a 60 y.o. female *who was admitted here recently for multiple falls and has had previous CAT scan of the head and facial bones. Patient normally receives her dialysis today and she states she had a hard time seeing the phone and called the dialysis due to her eyes swelling.  Promptly referred here to the emergency department. Patient states the swelling I's and not a problem. The patient apparently had this again evaluated during her last stay here for falls. She denies any visual acuity changes. She denies any pain in the eye itself. She states she has been using an eye appointment which she is now out of at this point. Past Medical History:  Diagnosis Date  . Anemia   . Apnea, sleep    for sleep study 10/17/15-no cpap yet  . Arthritis   . Bronchitis   . Chronic kidney disease   . COPD (chronic obstructive pulmonary disease) ([place])   . Depression   . Dialysis patient ([place]) 2014  . GERD (gastroesophageal reflux disease)   . Headache   . Hyperlipidemia   . Hypertension   . Obesity   . Renal dialysis device, implant, or graft complication    RIGHT CHEST CATH    Patient Active Problem List   Diagnosis Date Noted  . Altered mental status   . Fall   . Traumatic hematoma of forehead   . Acute respiratory failure with hypercapnia ([place]) 10/06/2016  . Pulmonary edema 09/14/2016  . Respiratory failure ([place]) 07/23/2016  . GIB (gastrointestinal bleeding) 07/09/2016  . Protein-calorie malnutrition, severe 05/29/2016  . Hyperglycemia 05/28/2016  . Pain in limb 10/28/2015  . ESRD on dialysis ([place]) 10/28/2015  . Essential hypertension 10/28/2015  . ESRD on hemodialysis ([place]) 03/29/2015  . HTN (hypertension) 03/29/2015  . COPD (chronic obstructive pulmonary disease) ([place]) 03/29/2015  . GAD (generalized anxiety disorder) 03/29/2015    Past  Surgical History:  Procedure Laterality Date  . ABDOMINAL HYSTERECTOMY    . COLONOSCOPY  2011  . COLONOSCOPY WITH PROPOFOL N/A 09/20/2015   Procedure: COLONOSCOPY WITH PROPOFOL;  Surgeon: Lucilla Lame, MD;  Location: [facility] ENDOSCOPY;  Service: Endoscopy;  Laterality: N/A;  . DIALYSIS FISTULA CREATION    . ESOPHAGOGASTRODUODENOSCOPY N/A 07/13/2016   Procedure: ESOPHAGOGASTRODUODENOSCOPY (EGD);  Surgeon: Lollie Sails, MD;  Location: [facility] ENDOSCOPY;  Service: Endoscopy;  Laterality: N/A;  . ESOPHAGOGASTRODUODENOSCOPY (EGD) WITH PROPOFOL N/A 06/02/2016   Procedure: ESOPHAGOGASTRODUODENOSCOPY (EGD) WITH PROPOFOL;  Surgeon: Manya Silvas, MD;  Location: [facility] ENDOSCOPY;  Service: Endoscopy;  Laterality: N/A;  . PERIPHERAL VASCULAR CATHETERIZATION N/A 05/10/2015   Procedure: A/V Shuntogram/Fistulagram;  Surgeon: Katha Cabal, MD;  Location: [place] CV LAB;  Service: Cardiovascular;  Laterality: N/A;  . PERIPHERAL VASCULAR CATHETERIZATION Left 05/10/2015   Procedure: A/V Shunt Intervention;  Surgeon: Katha Cabal, MD;  Location: [place] CV LAB;  Service: Cardiovascular;  Laterality: Left;  . PERIPHERAL VASCULAR CATHETERIZATION Left 08/23/2015   Procedure: A/V Shuntogram/Fistulagram;  Surgeon: Katha Cabal, MD;  Location: [place] CV LAB;  Service: Cardiovascular;  Laterality: Left;  . PERIPHERAL VASCULAR CATHETERIZATION N/A 08/23/2015   Procedure: A/V Shunt Intervention;  Surgeon: Katha Cabal, MD;  Location: [place] [facility] LAB;  Service: Cardiovascular;  Laterality: N/A;  . PERIPHERAL VASCULAR CATHETERIZATION  08/23/2015   Procedure: Dialysis/Perma Catheter Insertion;  Surgeon: Katha Cabal, MD;  Location: [place] [facility] LAB;  Service: Cardiovascular;;  . PERIPHERAL VASCULAR CATHETERIZATION N/A 01/03/2016   Procedure: Dialysis/Perma Catheter Removal;  Surgeon: Katha Cabal, MD;  Location: [place] [facility] LAB;  Service: Cardiovascular;  Laterality: N/A;   . REVISON OF ARTERIOVENOUS FISTULA Left 10/28/2015   Procedure: REVISON OF ARTERIOVENOUS FISTULA WITH ARTEGRAFT;  Surgeon: Katha Cabal, MD;  Location: [facility] ORS;  Service: Vascular;  Laterality: Left;  . WOUND DEBRIDEMENT Left 10/28/2015   Procedure: Resection of shoulder cyst ( left );  Surgeon: Katha Cabal, MD;  Location: [facility] ORS;  Service: Vascular;  Laterality: Left;    Past Surgical History:  Procedure Laterality Date  . ABDOMINAL HYSTERECTOMY    . COLONOSCOPY  2011  . COLONOSCOPY WITH PROPOFOL N/A 09/20/2015   Procedure: COLONOSCOPY WITH PROPOFOL;  Surgeon: Lucilla Lame, MD;  Location: [facility] ENDOSCOPY;  Service: Endoscopy;  Laterality: N/A;  . DIALYSIS FISTULA CREATION    . ESOPHAGOGASTRODUODENOSCOPY N/A 07/13/2016   Procedure: ESOPHAGOGASTRODUODENOSCOPY (EGD);  Surgeon: Lollie Sails, MD;  Location: [facility] ENDOSCOPY;  Service: Endoscopy;  Laterality: N/A;  . ESOPHAGOGASTRODUODENOSCOPY (EGD) WITH PROPOFOL N/A 06/02/2016   Procedure: ESOPHAGOGASTRODUODENOSCOPY (EGD) WITH PROPOFOL;  Surgeon: Manya Silvas, MD;  Location: [facility] ENDOSCOPY;  Service: Endoscopy;  Laterality: N/A;  . PERIPHERAL VASCULAR CATHETERIZATION N/A 05/10/2015   Procedure: A/V Shuntogram/Fistulagram;  Surgeon: Katha Cabal, MD;  Location: [place] [facility] LAB;  Service: Cardiovascular;  Laterality: N/A;  . PERIPHERAL VASCULAR CATHETERIZATION Left 05/10/2015   Procedure: A/V Shunt Intervention;  Surgeon: Katha Cabal, MD;  Location: [place] [facility] LAB;  Service: Cardiovascular;  Laterality: Left;  . PERIPHERAL VASCULAR CATHETERIZATION Left 08/23/2015   Procedure: A/V Shuntogram/Fistulagram;  Surgeon: Katha Cabal, MD;  Location: [place] [facility] LAB;  Service: Cardiovascular;  Laterality: Left;  . PERIPHERAL VASCULAR CATHETERIZATION N/A 08/23/2015   Procedure: A/V Shunt Intervention;  Surgeon: Katha Cabal, MD;  Location: [place] [facility] LAB;  Service: Cardiovascular;  Laterality: N/A;  .  PERIPHERAL VASCULAR CATHETERIZATION  08/23/2015   Procedure: Dialysis/Perma Catheter Insertion;  Surgeon: Katha Cabal, MD;  Location: [place] CV LAB;  Service: Cardiovascular;;  . PERIPHERAL VASCULAR CATHETERIZATION N/A 01/03/2016   Procedure: Dialysis/Perma Catheter Removal;  Surgeon: Katha Cabal, MD;  Location: [place] [facility] LAB;  Service: Cardiovascular;  Laterality: N/A;  . REVISON OF ARTERIOVENOUS FISTULA Left 10/28/2015   Procedure: REVISON OF ARTERIOVENOUS FISTULA WITH ARTEGRAFT;  Surgeon: Katha Cabal, MD;  Location: [facility] ORS;  Service: Vascular;  Laterality: Left;  . WOUND DEBRIDEMENT Left 10/28/2015   Procedure: Resection of shoulder cyst ( left );  Surgeon: Katha Cabal, MD;  Location: [facility] ORS;  Service: Vascular;  Laterality: Left;    Current Outpatient Rx  . Order #: [id number] Class: Historical Med  . Order #: [id number] Class: Historical Med  . Order #: [id number] Class: Historical Med  . Order #: [id number] Class: Normal  . Order #: [id number] Class: Normal  . Order #: [id number] Class: Historical Med  . Order #: [id number] Class: Historical Med  . Order #: [id number] Class: Normal  . Order #: [id number] Class: Historical Med  . Order #: [id number] Class: Historical Med  . Order #: [id number] Class: Normal  . Order #: [id number] Class: Historical Med  . Order #: [id number] Class: Historical Med  . Order #: [id number] Class: Historical Med  . Order #: [id number] Class: Historical Med  . Order #: [id number] Class: Historical Med  . Order #: [id number] Class: Historical Med  . Order #: [id number] Class: Historical Med  . Order #: [id number] Class: Historical Med  .  Order #: [id number] Class: Print  . Order #: [id number] Class: Historical Med    Allergies:  Chantix [varenicline] and Sulfa antibiotics  Family History: Family History  Problem Relation Age of Onset  . Heart disease Mother   . Cancer Father   . Cancer Sister     Social History: Social History  Substance Use Topics   . Smoking status: Former Smoker    Packs/day: 0.50    Years: 40.00    Types: Cigarettes    Quit date: 11/23/2014  . Smokeless tobacco: Never Used  . Alcohol use No     Review of Systems:   10 point review of systems was performed and was otherwise negative:  Constitutional: No fever Eyes: No visual disturbances ENT: No sore throat, ear pain Cardiac: No chest pain Respiratory: No shortness of breath, wheezing, or stridor Abdomen: No abdominal pain, no vomiting, No diarrhea Endocrine: No weight loss, No night sweats Extremities: No peripheral edema, cyanosis Skin: No rashes, easy bruising Neurologic: No focal weakness, trouble with speech or swollowing Urologic: No dysuria, Hematuria, or urinary frequency   Physical Exam:  ED Triage Vitals  Enc Vitals Group     BP 10/16/16 1053 (!) 159/101     Pulse Rate 10/16/16 1053 72     Resp 10/16/16 1053 20     Temp --      Temp Source 10/16/16 1053 Oral     SpO2 10/16/16 1053 99 %     Weight 10/16/16 1054 138 lb (62.6 kg)     Height 10/16/16 1054 5\' 5"  (1.651 m)     Head Circumference --      Peak Flow --      Pain Score 10/16/16 1056 8     Pain Loc --      Pain Edu? --      Excl. in [place]? --     General: Awake , Alert , and Oriented times 3; GCS 15 Head: Normal cephalic , atraumatic. Midface is stable Eyes: Pupils equal , round, reactive to light. Patient has bilateral conjunctival irritation with extraocular eye movements intact no obvious hyphema or glaucoma states that her vision is blurred but at baseline without glasses. Significant bilateral periorbital edema  Nose/Throat: No nasal drainage, patent upper airway without erythema or exudate.  Neck: Supple, Full range of motion, No anterior adenopathy or palpable thyroid masses Lungs: Clear to ascultation without wheezes , rhonchi, or rales Heart: Regular rate, regular rhythm without murmurs , gallops , or rubs Abdomen: Soft, non tender without rebound, guarding , or  rigidity; bowel sounds positive and symmetric in all 4 quadrants. No organomegaly .        Extremities: 2 plus symmetric pulses. No edema, clubbing or cyanosis Neurologic: normal ambulation, Motor symmetric without deficits, sensory intact Skin: warm, dry, no rashes     Radiology:  "Dg Pelvis 1-2 Views  Result Date: 10/06/2016 CLINICAL DATA:  Multiple falls.  Right hip pain. EXAM: PELVIS - 1-2 VIEW COMPARISON:  None. FINDINGS: There is no evidence of pelvic fracture or diastasis. No pelvic bone lesions are seen. IMPRESSION: 1. No evidence for hip fracture or dislocation. If there is high clinical suspicion for occult fracture or the patient refuses to weightbear, consider further evaluation with MRI. Although CT is expeditious, evidence is lacking regarding accuracy of CT over plain film radiography. Electronically Signed   By: Kerby Moors M.D.   On: 10/06/2016 16:23   Ct Head Wo Contrast  Result Date: 10/16/2016 CLINICAL DATA:  Pain following falls EXAM: CT HEAD WITHOUT CONTRAST CT MAXILLOFACIAL WITHOUT CONTRAST TECHNIQUE: Multidetector CT imaging of the head and maxillofacial structures were performed using the standard protocol without intravenous contrast. Multiplanar CT image reconstructions of the maxillofacial structures were also generated. COMPARISON:  Head CT October 07, 2016 FINDINGS: CT HEAD FINDINGS Brain: The ventricles are normal in size and configuration. There is no intracranial mass, hemorrhage, extra-axial fluid collection, or midline shift. There is slight small vessel disease in the centra semiovale bilaterally. Elsewhere gray-white compartments appear normal. No acute infarct is evident. Vascular: There is no hyperdense vessel. There is calcification in the distal left vertebral artery. There is calcification in the carotid siphon regions bilaterally. Skull: There is no blastic or lytic bone lesion. There is a moderate right frontal scalp hematoma, smaller compared to  recent prior study. There is a sebaceous cyst posteriorly on the right at the level of the skull base. Other: There is chronic mastoid disease on the right inferiorly. Other mastoid air cells clear. CT MAXILLOFACIAL FINDINGS Osseous: There is no evident fracture or dislocation. No blastic or lytic bone lesions. Orbits: There is extensive preseptal soft tissue edema, on the right, less than on recent prior study. There is mild preseptal soft tissue swelling on the left, slightly less than on prior study. No intraorbital lesions are evident. Intraorbital contents appear symmetric and unremarkable bilaterally. Sinuses: There is mild mucosal thickening in several anterior ethmoid air cells bilaterally. Paranasal sinuses elsewhere are clear. No air-fluid levels. Ostiomeatal unit complexes are patent bilaterally. Bony septum is midline. More anteriorly, there is fibrous septal deviation toward the right. Soft tissues: There is no soft tissue abscess. Salivary glands appear normal bilaterally. No adenopathy. Tongue and tongue base regions appear normal. Pharynx appears normal. There is degenerative change in the upper cervical spine. IMPRESSION: CT head: Mild periventricular small vessel disease, stable. No intracranial mass, hemorrhage, or extra-axial fluid collection. No acute infarct. Foci of arterial vascular calcification noted. Chronic inferior right mastoid disease. Sebaceous cyst posterior to the skullbase the right, stable. Right frontal scalp hematoma, smaller compared to recent study. CT maxillofacial: Soft tissue swelling over both orbits, more severe on the right than on the left with less severe compared to most recent prior study. No fracture or dislocation. Mild mucosal thickening in several ethmoid air cells. Other paranasal sinuses clear. Ostiomeatal complexes are patent bilaterally. Electronically Signed   By: Lowella Grip III M.D.   On: 10/16/2016 12:06   Ct Head Wo Contrast  Result Date:  10/07/2016 CLINICAL DATA:  Sharp headache and increased orbital swelling status post fall. EXAM: CT HEAD WITHOUT CONTRAST TECHNIQUE: Contiguous axial images were obtained from the base of the skull through the vertex without intravenous contrast. COMPARISON:  CT of the head and MRI of the head 10/06/2016 FINDINGS: Brain: No evidence of acute infarction, hemorrhage, hydrocephalus, extra-axial collection or mass lesion/mass effect. Chronic small vessel ischemic changes are seen in the periventricular white matter. Vascular: No hyperdense vessels. Calcific atherosclerotic disease at the skullbase. Skull: Negative for fracture or focal lesion. There is a marked predominantly right-sided scalp swelling, which extends to the preseptal right more than left orbits. Sinuses/Orbits: No acute finding. Other: Marked predominantly right-sided scalp hematoma/edema, which now extends to the preseptal right more than left orbits. Right occipital scalp subcutaneous nodule may represent a dermal lesion. IMPRESSION: No evidence of acute intracranial abnormality. Enlargement of previously seen predominantly right-sided mark scalp hematoma/ edema, which now extends to right more than left preseptal orbits.  Electronically Signed   By: Fidela Salisbury M.D.   On: 10/07/2016 19:24   Ct Head Wo Contrast  Result Date: 10/06/2016 CLINICAL DATA:  Pain after multiple falls EXAM: CT HEAD WITHOUT CONTRAST TECHNIQUE: Contiguous axial images were obtained from the base of the skull through the vertex without intravenous contrast. COMPARISON:  None. FINDINGS: Brain: No subdural, epidural, or subarachnoid hemorrhage. Calcifications seen in the left basal ganglia of no acute significance. No acute cortical ischemia or infarct. The cerebellum and brainstem are normal. Basal cisterns are patent. Ventricles and sulci are normal for age. Vascular: Calcified atherosclerosis in the intracranial carotid arteries. Skull: Normal. Negative for fracture  or focal lesion. Sinuses/Orbits: The paranasal sinuses and left mastoid air cells are normal. The middle ears are well aerated. There is fluid in scattered right mastoid air cells with no overlying soft tissue abnormalities or bony erosion. Other: Significance scalp thickening diffusely, centered to the right. High attenuation within the scout thickening is consistent with acute blood products. Visualized portions of the globes are. Soft tissues otherwise normal. IMPRESSION: 1. Marked scalp edema and hematoma consistent with history. No acute intracranial abnormality identified. Electronically Signed   By: Dorise Bullion III M.D   On: 10/06/2016 13:35   Mr Brain Wo Contrast  Result Date: 10/06/2016 CLINICAL DATA:  Multiple falls.  Hallucinations. EXAM: MRI HEAD WITHOUT CONTRAST TECHNIQUE: Multiplanar, multiecho pulse sequences of the brain and surrounding structures were obtained without intravenous contrast. COMPARISON:  Head CT same day FINDINGS: The study suffers from considerable motion degradation. Brain: Diffusion imaging does not show any acute or subacute infarction. The brainstem and cerebellum are normal. Cerebral hemispheres show abnormal T2 and FLAIR signal in the deep white matter most consistent with chronic small vessel disease. No cortical or large vessel territory infarction. No mass lesion. No sign of intracranial hemorrhage. No hydrocephalus or extra-axial collection. Vascular: Major vessels at the base of the brain show flow. Skull and upper cervical spine: Somewhat heterogeneous marrow pattern. This may be due to chronic renal disease. Sinuses/Orbits: Sinuses are negative. There is mastoid effusion on the right. Other: Profound diffuse scalp swelling presumably post traumatic. IMPRESSION: No acute intracranial finding. Chronic small-vessel ischemic change of the deep white matter. Diffuse soft tissue swelling of the scalp, presumably post traumatic. Heterogeneous marrow pattern of the  calvarium, presumed secondary to chronic renal disease. Electronically Signed   By: Nelson Chimes M.D.   On: 10/06/2016 15:54   Dg Chest Portable 1 View  Result Date: 10/06/2016 CLINICAL DATA:  Patient presents to the ED with multiple falls. Patient states, "I fell 14 times today." Patient is complaining of right knee pain and right hip pain. Pt asleep during all x-ray exams; pt poor historian at time of x-rays. Hx/o COPD. Dialysis pt. EXAM: PORTABLE CHEST 1 VIEW COMPARISON:  09/15/2016 FINDINGS: Mild enlarged cardiac silhouette. There is mild diffuse interstitial pattern. No focal infiltrate. No pneumothorax. No evidence of thoracic trauma. IMPRESSION: 1. No evidence of thoracic trauma. 2. Mild interstitial edema pattern Electronically Signed   By: Suzy Bouchard M.D.   On: 10/06/2016 16:23   Dg Knee Complete 4 Views Left  Result Date: 10/06/2016 CLINICAL DATA:  Multiple falls. EXAM: LEFT KNEE - COMPLETE 4+ VIEW COMPARISON:  None. FINDINGS: No evidence of fracture, dislocation, or joint effusion. No evidence of arthropathy or other focal bone abnormality. Soft tissues are unremarkable. IMPRESSION: No fracture or dislocation. Electronically Signed   By: Suzy Bouchard M.D.   On: 10/06/2016 16:20  Dg Knee Complete 4 Views Right  Result Date: 10/06/2016 CLINICAL DATA:  Patient presents to the ED with multiple falls. Patient states, "I fell 14 times today." Patient is complaining of right knee pain and right hip pain. Pt asleep during all x-ray exams; pt poor historian at time of x-rays. Hx/o COPD. Dialysis pt. EXAM: RIGHT KNEE - COMPLETE 4+ VIEW COMPARISON:  None. FINDINGS: No fracture of the proximal tibia or distal femur. Patella is normal. No joint effusion. IMPRESSION: No fracture or dislocation. Electronically Signed   By: Suzy Bouchard M.D.   On: 10/06/2016 16:22   Ct Maxillofacial Wo Contrast  Result Date: 10/16/2016 CLINICAL DATA:  Pain following falls EXAM: CT HEAD WITHOUT CONTRAST  CT MAXILLOFACIAL WITHOUT CONTRAST TECHNIQUE: Multidetector CT imaging of the head and maxillofacial structures were performed using the standard protocol without intravenous contrast. Multiplanar CT image reconstructions of the maxillofacial structures were also generated. COMPARISON:  Head CT October 07, 2016 FINDINGS: CT HEAD FINDINGS Brain: The ventricles are normal in size and configuration. There is no intracranial mass, hemorrhage, extra-axial fluid collection, or midline shift. There is slight small vessel disease in the centra semiovale bilaterally. Elsewhere gray-white compartments appear normal. No acute infarct is evident. Vascular: There is no hyperdense vessel. There is calcification in the distal left vertebral artery. There is calcification in the carotid siphon regions bilaterally. Skull: There is no blastic or lytic bone lesion. There is a moderate right frontal scalp hematoma, smaller compared to recent prior study. There is a sebaceous cyst posteriorly on the right at the level of the skull base. Other: There is chronic mastoid disease on the right inferiorly. Other mastoid air cells clear. CT MAXILLOFACIAL FINDINGS Osseous: There is no evident fracture or dislocation. No blastic or lytic bone lesions. Orbits: There is extensive preseptal soft tissue edema, on the right, less than on recent prior study. There is mild preseptal soft tissue swelling on the left, slightly less than on prior study. No intraorbital lesions are evident. Intraorbital contents appear symmetric and unremarkable bilaterally. Sinuses: There is mild mucosal thickening in several anterior ethmoid air cells bilaterally. Paranasal sinuses elsewhere are clear. No air-fluid levels. Ostiomeatal unit complexes are patent bilaterally. Bony septum is midline. More anteriorly, there is fibrous septal deviation toward the right. Soft tissues: There is no soft tissue abscess. Salivary glands appear normal bilaterally. No adenopathy.  Tongue and tongue base regions appear normal. Pharynx appears normal. There is degenerative change in the upper cervical spine. IMPRESSION: CT head: Mild periventricular small vessel disease, stable. No intracranial mass, hemorrhage, or extra-axial fluid collection. No acute infarct. Foci of arterial vascular calcification noted. Chronic inferior right mastoid disease. Sebaceous cyst posterior to the skullbase the right, stable. Right frontal scalp hematoma, smaller compared to recent study. CT maxillofacial: Soft tissue swelling over both orbits, more severe on the right than on the left with less severe compared to most recent prior study. No fracture or dislocation. Mild mucosal thickening in several ethmoid air cells. Other paranasal sinuses clear. Ostiomeatal complexes are patent bilaterally. Electronically Signed   By: Lowella Grip III M.D.   On: 10/16/2016 12:06  "  I personally reviewed the radiologic studies  A CT results reviewed and also the new CTs were closely looked at which did not show any significant  changes.   ED Course:  Patient's stay here was uneventful and I felt her edema is likely worse when she woke up this morning due to previous trauma. She denies any new  falls or new head or facial trauma than from her previous admission here to the hospital. Her acute injury was noticed radiographically and the patient was given gentamicin ophthalmic ointment to apply. Conjunctiva is irritated but does not appear to be a subcontractor hemorrhage. Clinical Course      Assessment: Periorbital facial swelling, status post trauma   Final Clinical Impression:  Final diagnoses:  Facial swelling     Plan: * Patient should proceed with her dialysis at this time. She was advised to sit upright and allow the swelling did decrease and apply ice or moist heat to help with circulation of the edema.            Daymon Larsen, MD 10/16/16 [phone]

## 2016-10-16 NOTE — ED Notes (Signed)
Awaiting pharmacy to send up medication.

## 2016-10-16 NOTE — ED Notes (Signed)
Dr. Marcelene Butte notified of BP. No new orders at this time.

## 2016-10-29 ENCOUNTER — Inpatient Hospital Stay
Admission: EM | Admit: 2016-10-29 | Discharge: 2016-10-30 | DRG: 189 | Disposition: A | Discharge status: Home / Self Care | Payer: Medicare Other | Attending: Internal Medicine | Admitting: Internal Medicine

## 2016-10-29 ENCOUNTER — Encounter: Payer: Self-pay | Admitting: Emergency Medicine

## 2016-10-29 ENCOUNTER — Emergency Department: Payer: Medicare Other

## 2016-10-29 DIAGNOSIS — Z87891 Personal history of nicotine dependence: Secondary | ICD-10-CM | POA: Diagnosis not present

## 2016-10-29 DIAGNOSIS — N186 End stage renal disease: Secondary | ICD-10-CM

## 2016-10-29 DIAGNOSIS — J9621 Acute and chronic respiratory failure with hypoxia: Secondary | ICD-10-CM | POA: Diagnosis present

## 2016-10-29 DIAGNOSIS — M1990 Unspecified osteoarthritis, unspecified site: Secondary | ICD-10-CM | POA: Diagnosis present

## 2016-10-29 DIAGNOSIS — J9601 Acute respiratory failure with hypoxia: Secondary | ICD-10-CM

## 2016-10-29 DIAGNOSIS — Z90710 Acquired absence of both cervix and uterus: Secondary | ICD-10-CM

## 2016-10-29 DIAGNOSIS — E872 Acidosis: Secondary | ICD-10-CM | POA: Diagnosis present

## 2016-10-29 DIAGNOSIS — W19XXXA Unspecified fall, initial encounter: Secondary | ICD-10-CM | POA: Diagnosis present

## 2016-10-29 DIAGNOSIS — J441 Chronic obstructive pulmonary disease with (acute) exacerbation: Secondary | ICD-10-CM | POA: Diagnosis present

## 2016-10-29 DIAGNOSIS — G4733 Obstructive sleep apnea (adult) (pediatric): Secondary | ICD-10-CM | POA: Diagnosis present

## 2016-10-29 DIAGNOSIS — Z7951 Long term (current) use of inhaled steroids: Secondary | ICD-10-CM | POA: Diagnosis not present

## 2016-10-29 DIAGNOSIS — Z809 Family history of malignant neoplasm, unspecified: Secondary | ICD-10-CM | POA: Diagnosis not present

## 2016-10-29 DIAGNOSIS — E669 Obesity, unspecified: Secondary | ICD-10-CM | POA: Diagnosis present

## 2016-10-29 DIAGNOSIS — J9622 Acute and chronic respiratory failure with hypercapnia: Secondary | ICD-10-CM | POA: Diagnosis present

## 2016-10-29 DIAGNOSIS — Z6821 Body mass index (BMI) 21.0-21.9, adult: Secondary | ICD-10-CM | POA: Diagnosis not present

## 2016-10-29 DIAGNOSIS — D631 Anemia in chronic kidney disease: Secondary | ICD-10-CM | POA: Diagnosis present

## 2016-10-29 DIAGNOSIS — K219 Gastro-esophageal reflux disease without esophagitis: Secondary | ICD-10-CM | POA: Diagnosis present

## 2016-10-29 DIAGNOSIS — Z8249 Family history of ischemic heart disease and other diseases of the circulatory system: Secondary | ICD-10-CM

## 2016-10-29 DIAGNOSIS — Z79899 Other long term (current) drug therapy: Secondary | ICD-10-CM

## 2016-10-29 DIAGNOSIS — Z888 Allergy status to other drugs, medicaments and biological substances status: Secondary | ICD-10-CM

## 2016-10-29 DIAGNOSIS — Z992 Dependence on renal dialysis: Secondary | ICD-10-CM

## 2016-10-29 DIAGNOSIS — G9340 Encephalopathy, unspecified: Secondary | ICD-10-CM

## 2016-10-29 DIAGNOSIS — Z882 Allergy status to sulfonamides status: Secondary | ICD-10-CM | POA: Diagnosis not present

## 2016-10-29 DIAGNOSIS — Z9981 Dependence on supplemental oxygen: Secondary | ICD-10-CM | POA: Diagnosis not present

## 2016-10-29 DIAGNOSIS — J9602 Acute respiratory failure with hypercapnia: Secondary | ICD-10-CM

## 2016-10-29 DIAGNOSIS — N2581 Secondary hyperparathyroidism of renal origin: Secondary | ICD-10-CM | POA: Diagnosis present

## 2016-10-29 DIAGNOSIS — Z9889 Other specified postprocedural states: Secondary | ICD-10-CM

## 2016-10-29 DIAGNOSIS — I5032 Chronic diastolic (congestive) heart failure: Secondary | ICD-10-CM | POA: Diagnosis present

## 2016-10-29 DIAGNOSIS — I132 Hypertensive heart and chronic kidney disease with heart failure and with stage 5 chronic kidney disease, or end stage renal disease: Secondary | ICD-10-CM | POA: Diagnosis present

## 2016-10-29 DIAGNOSIS — R0989 Other specified symptoms and signs involving the circulatory and respiratory systems: Secondary | ICD-10-CM | POA: Diagnosis present

## 2016-10-29 LAB — COMPREHENSIVE METABOLIC PANEL
ALK PHOS: 222 U/L — AB (ref 38–126)
ALK PHOS: 229 U/L — AB (ref 38–126)
ALT: 17 U/L (ref 14–54)
ALT: 19 U/L (ref 14–54)
ANION GAP: 17 — AB (ref 5–15)
ANION GAP: 17 — AB (ref 5–15)
AST: 42 U/L — ABNORMAL HIGH (ref 15–41)
AST: 24 U/L (ref 15–41)
Albumin: 4.1 g/dL (ref 3.5–5.0)
Albumin: 4.1 g/dL (ref 3.5–5.0)
BILIRUBIN TOTAL: 1.1 mg/dL (ref 0.3–1.2)
BILIRUBIN TOTAL: 0.3 mg/dL (ref 0.3–1.2)
BUN: 50 mg/dL — ABNORMAL HIGH (ref 6–20)
BUN: 54 mg/dL — ABNORMAL HIGH (ref 6–20)
CALCIUM: 7.8 mg/dL — AB (ref 8.9–10.3)
CALCIUM: 7.5 mg/dL — AB (ref 8.9–10.3)
CO2: 24 mmol/L (ref 22–32)
CO2: 26 mmol/L (ref 22–32)
Chloride: 100 mmol/L — ABNORMAL LOW (ref 101–111)
Chloride: 98 mmol/L — ABNORMAL LOW (ref 101–111)
Creatinine, Ser: 9.79 mg/dL — ABNORMAL HIGH (ref 0.44–1.00)
Creatinine, Ser: 10.35 mg/dL — ABNORMAL HIGH (ref 0.44–1.00)
GFR calc non Af Amer: 4 mL/min — ABNORMAL LOW (ref >60)
GFR, EST AFRICAN AMERICAN: 4 mL/min — AB (ref >60)
GFR, EST AFRICAN AMERICAN: 4 mL/min — AB (ref >60)
GFR, EST NON AFRICAN AMERICAN: 4 mL/min — AB (ref >60)
Glucose, Bld: 128 mg/dL — ABNORMAL HIGH (ref 65–99)
Glucose, Bld: 124 mg/dL — ABNORMAL HIGH (ref 65–99)
POTASSIUM: 4.8 mmol/L (ref 3.5–5.1)
Potassium: 4.9 mmol/L (ref 3.5–5.1)
Sodium: 141 mmol/L (ref 135–145)
Sodium: 141 mmol/L (ref 135–145)
TOTAL PROTEIN: 7.6 g/dL (ref 6.5–8.1)
TOTAL PROTEIN: 7.6 g/dL (ref 6.5–8.1)

## 2016-10-29 LAB — BLOOD GAS, ARTERIAL
Acid-Base Excess: 3.6 mmol/L — ABNORMAL HIGH (ref 0.0–2.0)
Bicarbonate: 32.2 mmol/L — ABNORMAL HIGH (ref 20.0–28.0)
FIO2: 0.28
O2 SAT: 95 %
PATIENT TEMPERATURE: 37
PO2 ART: 84 mmHg (ref 83.0–108.0)
pCO2 arterial: 67 mmHg (ref 32.0–48.0)
pH, Arterial: 7.29 — ABNORMAL LOW (ref 7.350–7.450)

## 2016-10-29 LAB — BLOOD GAS, VENOUS
Acid-Base Excess: 0.3 mmol/L (ref 0.0–2.0)
Acid-Base Excess: 0.2 mmol/L (ref 0.0–2.0)
BICARBONATE: 30.1 mmol/L — AB (ref 20.0–28.0)
Bicarbonate: 30.6 mmol/L — ABNORMAL HIGH (ref 20.0–28.0)
Delivery systems: POSITIVE
FIO2: 0.3
O2 Saturation: 75.9 %
PCO2 VEN: 80 mmHg — AB (ref 44.0–60.0)
PCO2 VEN: 77 mmHg — AB (ref 44.0–60.0)
PEEP/CPAP: 8 cmH2O
PH VEN: 7.19 — AB (ref 7.250–7.430)
PO2 VEN: 51 mmHg — AB (ref 32.0–45.0)
Patient temperature: 37
Patient temperature: 37
Pressure support: 18 cmH2O
RATE: 8 resp/min
pH, Ven: 7.2 — ABNORMAL LOW (ref 7.250–7.430)

## 2016-10-29 LAB — CBC
HCT: 39.2 % (ref 35.0–47.0)
Hemoglobin: 12.5 g/dL (ref 12.0–16.0)
MCH: 30.6 pg (ref 26.0–34.0)
MCHC: 31.8 g/dL — ABNORMAL LOW (ref 32.0–36.0)
MCV: 96.2 fL (ref 80.0–100.0)
PLATELETS: 275 10*3/uL (ref 150–440)
RBC: 4.07 MIL/uL (ref 3.80–5.20)
RDW: 19.3 % — ABNORMAL HIGH (ref 11.5–14.5)
WBC: 9 10*3/uL (ref 3.6–11.0)

## 2016-10-29 LAB — CBC WITH DIFFERENTIAL/PLATELET
BASOS ABS: 0.1 10*3/uL (ref 0–0.1)
Basophils Relative: 1 %
EOS ABS: 0 10*3/uL (ref 0–0.7)
EOS PCT: 0 %
HCT: 37.2 % (ref 35.0–47.0)
Hemoglobin: 12.1 g/dL (ref 12.0–16.0)
LYMPHS ABS: 0.9 10*3/uL — AB (ref 1.0–3.6)
LYMPHS PCT: 9 %
MCH: 31 pg (ref 26.0–34.0)
MCHC: 32.4 g/dL (ref 32.0–36.0)
MCV: 95.6 fL (ref 80.0–100.0)
MONO ABS: 0.5 10*3/uL (ref 0.2–0.9)
Monocytes Relative: 5 %
Neutro Abs: 8.5 10*3/uL — ABNORMAL HIGH (ref 1.4–6.5)
Neutrophils Relative %: 85 %
PLATELETS: 323 10*3/uL (ref 150–440)
RBC: 3.89 MIL/uL (ref 3.80–5.20)
RDW: 18.9 % — AB (ref 11.5–14.5)
WBC: 10 10*3/uL (ref 3.6–11.0)

## 2016-10-29 LAB — GLUCOSE, CAPILLARY: Glucose-Capillary: 122 mg/dL — ABNORMAL HIGH (ref 65–99)

## 2016-10-29 LAB — TROPONIN I: Troponin I: 0.06 ng/mL (ref <0.03)

## 2016-10-29 LAB — BRAIN NATRIURETIC PEPTIDE: B NATRIURETIC PEPTIDE 5: 216 pg/mL — AB (ref 0.0–100.0)

## 2016-10-29 LAB — MRSA PCR SCREENING: MRSA BY PCR: NEGATIVE

## 2016-10-29 LAB — MAGNESIUM: MAGNESIUM: 1.4 mg/dL — AB (ref 1.7–2.4)

## 2016-10-29 MED ORDER — DEXTROSE 5 % IV SOLN
500.0000 mg | Freq: Every day | INTRAVENOUS | Status: DC
  Administered 2016-10-29: 500 mg via INTRAVENOUS
  Filled 2016-10-29 (×2): qty 500

## 2016-10-29 MED ORDER — METHYLPREDNISOLONE SODIUM SUCC 125 MG IJ SOLR
60.0000 mg | Freq: Three times a day (TID) | INTRAMUSCULAR | Status: DC
  Administered 2016-10-29 – 2016-10-30 (×3): 60 mg via INTRAVENOUS
  Filled 2016-10-29 (×3): qty 2

## 2016-10-29 MED ORDER — FAMOTIDINE IN NACL 20-0.9 MG/50ML-% IV SOLN
20.0000 mg | INTRAVENOUS | Status: DC
  Administered 2016-10-29: 20 mg via INTRAVENOUS
  Filled 2016-10-29 (×2): qty 50

## 2016-10-29 MED ORDER — ORAL CARE MOUTH RINSE
15.0000 mL | Freq: Two times a day (BID) | OROMUCOSAL | Status: DC
  Administered 2016-10-30: 15 mL via OROMUCOSAL

## 2016-10-29 MED ORDER — SODIUM CHLORIDE 0.9 % IV SOLN: 250.0000 mL | INTRAVENOUS | Status: DC | PRN

## 2016-10-29 MED ORDER — IPRATROPIUM-ALBUTEROL 0.5-2.5 (3) MG/3ML IN SOLN
3.0000 mL | Freq: Once | RESPIRATORY_TRACT | Status: AC
  Administered 2016-10-29: 3 mL via RESPIRATORY_TRACT
  Filled 2016-10-29: qty 3

## 2016-10-29 MED ORDER — METHYLPREDNISOLONE SODIUM SUCC 125 MG IJ SOLR
125.0000 mg | Freq: Once | INTRAMUSCULAR | Status: AC
  Administered 2016-10-29: 125 mg via INTRAVENOUS
  Filled 2016-10-29: qty 2

## 2016-10-29 MED ORDER — IPRATROPIUM-ALBUTEROL 0.5-2.5 (3) MG/3ML IN SOLN
3.0000 mL | Freq: Four times a day (QID) | RESPIRATORY_TRACT | Status: DC
  Administered 2016-10-29 – 2016-10-30 (×5): 3 mL via RESPIRATORY_TRACT
  Filled 2016-10-29 (×5): qty 3

## 2016-10-29 MED ORDER — HEPARIN SODIUM (PORCINE) 5000 UNIT/ML IJ SOLN
5000.0000 [IU] | Freq: Three times a day (TID) | INTRAMUSCULAR | Status: DC
  Administered 2016-10-30: 5000 [IU] via SUBCUTANEOUS
  Filled 2016-10-29 (×2): qty 1

## 2016-10-29 MED ORDER — CHLORHEXIDINE GLUCONATE 0.12 % MT SOLN
15.0000 mL | Freq: Two times a day (BID) | OROMUCOSAL | Status: DC
  Administered 2016-10-29 – 2016-10-30 (×2): 15 mL via OROMUCOSAL
  Filled 2016-10-29 (×2): qty 15

## 2016-10-29 NOTE — Progress Notes (Signed)
Pre HD assessment  

## 2016-10-29 NOTE — Progress Notes (Signed)
Subjective:   Admitted on 10/29/2016 for End stage renal disease ([place]) [N18.6] COPD exacerbation ([place]) [J44.1] Acute respiratory failure with hypoxia and hypercapnia ([place]) [J96.01, J96.02]   Admitted to ICU. Placed on Bipap. With respiratory acidosis. Has not missed outpatient dialysis treatments. States she follows a low salt diet and fluid restriction.   She fell and hit her head twice this morning.  Objective:  Vital signs in last 24 hours:  Temp:  [97.8 F (36.6 C)-98.4 F (36.9 C)] 97.8 F (36.6 C) (12/04 1400) Pulse Rate:  [79-86] 86 (12/04 1045) Resp:  [18-33] 33 (12/04 1045) BP: (174-202)/(97-136) 181/97 (12/04 1230) SpO2:  [93 %-100 %] 97 % (12/04 1412) Weight:  [59.9 kg (132 lb)] 59.9 kg (132 lb) (12/04 0947)  Weight change:  Filed Weights   10/29/16 0947  Weight: 59.9 kg (132 lb)    Intake/Output:   No intake or output data in the 24 hours ending 10/29/16 1415   Physical Exam: General: Ill appearing  HEENT BIPAP  Neck distended neck veins  Pulm/lungs Bilateral crackles  CVS/Heart S1S2 no rubs, regular  Abdomen:  Soft, nontender, BS present  Extremities: No LE edema  Neurologic: Alert, oriented to time, person, place  Skin: No acute rashes  Access: Left arm AV fistula       Basic Metabolic Panel:   Recent Labs Lab 10/29/16 0955  NA 141  K 4.9  CL 100*  CO2 24  GLUCOSE 128*  BUN 50*  CREATININE 9.79*  CALCIUM 7.8*     CBC:  Recent Labs Lab 10/29/16 0955  WBC 10.0  NEUTROABS 8.5*  HGB 12.1  HCT 37.2  MCV 95.6  PLT 323      Microbiology:  Recent Results (from the past 720 hour(s))  Urine culture     Status: None   Collection Time: 10/06/16  5:17 PM  Result Value Ref Range Status   Specimen Description URINE, RANDOM  Final   Special Requests NONE  Final   Culture NO GROWTH Performed at [facility]   Final   Report Status 10/08/2016 FINAL  Final  MRSA PCR Screening     Status: None   Collection Time: 10/06/16  10:14 PM  Result Value Ref Range Status   MRSA by PCR NEGATIVE NEGATIVE Final    Comment:        The GeneXpert MRSA Assay (FDA approved for NASAL specimens only), is one component of a comprehensive MRSA colonization surveillance program. It is not intended to diagnose MRSA infection nor to guide or monitor treatment for MRSA infections.     Coagulation Studies: No results for input(s): LABPROT, INR in the last 72 hours.  Urinalysis: No results for input(s): COLORURINE, LABSPEC, PHURINE, GLUCOSEU, HGBUR, BILIRUBINUR, KETONESUR, PROTEINUR, UROBILINOGEN, NITRITE, LEUKOCYTESUR in the last 72 hours.  Invalid input(s): APPERANCEUR    Imaging: Dg Chest Port 1 View  Result Date: 10/29/2016 CLINICAL DATA:  Shortness of breath, difficulty holding still EXAM: PORTABLE CHEST 1 VIEW COMPARISON:  10/06/2016 FINDINGS: There is bilateral interstitial thickening. There is no focal parenchymal opacity. There is no pleural effusion or pneumothorax. There is stable cardiomegaly. The osseous structures are unremarkable. IMPRESSION: Cardiomegaly with mild pulmonary vascular congestion. Electronically Signed   By: Kathreen Devoid   On: 10/29/2016 09:57     Medications:    . azithromycin  500 mg Intravenous Daily  . famotidine (PEPCID) IV  20 mg Intravenous Q24H  . heparin  5,000 Units Subcutaneous Q8H  . ipratropium-albuterol  3  mL Nebulization Q6H  . methylPREDNISolone (SOLU-MEDROL) injection  60 mg Intravenous Q8H   sodium chloride  Assessment/ Plan:  [name] with hypertension, hyperlipidemia, diastolic heart failure, hypertension, COPD, tobacco abuse, AOCD, SHPTH, LUE AVF, ESRD 02/2013, cocaine abuse Admitted on 10/29/2016 for End stage renal disease ([place]) [N18.6] COPD exacerbation ([place]) [J44.1] Acute respiratory failure with hypoxia and hypercapnia ([place]) [J96.01, J96.02]   [facility] [place]/MWF  1. End-stage renal disease on hemodialysis MWF. In respiratory failure: placed on  CPAP Hemodialysis today. Orders prepared.   2. Hypertension: at goal -  amlodipine, carvedilol, hydralazine  3. Anemia of chronic kidney disease:   - hold epo  4. Secondary hyperparathyroidism. PTH, calcium and phosphorus from 11/17 at goal. - calcium acetate with meals - Cinacalcet   LOS: 0 Michele Nash 12/4/20172:15 PM

## 2016-10-29 NOTE — Progress Notes (Signed)
Called patient report to [facility] on [address]  Patient in stable condition and transferred to room 216. Patient transported via wheel chair.

## 2016-10-29 NOTE — Progress Notes (Signed)
HD completed without issue. Tolerated 3.5L UF well. BP improved. Currently 163/88. Report given to primary RN at bedside. Pt sleeping.

## 2016-10-29 NOTE — ED Provider Notes (Signed)
[facility] Emergency Department Provider Note    L5 caveat: Review of systems and history is limited by difficulty breathing and altered mental status    Time seen: ----------------------------------------- 9:37 AM on 10/29/2016 -----------------------------------------    I have reviewed the triage vital signs and the nursing notes.   HISTORY  Chief Complaint No chief complaint on file.    HPI Michele Nash is a 60 y.o. female who arrives by ambulance for difficulty breathing. Patient arrives somewhat confused with difficulty breathing. Reported sats were in the 80 percentile range. She does not normally need oxygen. She is due for dialysis this morning at 11 AM. Patient is not able to elaborate further.Her husband states she is a Monday Wednesday Friday dialysis patient and had dialysis on Saturday because she missed Friday.   Past Medical History:  Diagnosis Date  . Anemia   . Apnea, sleep    for sleep study 10/17/15-no cpap yet  . Arthritis   . Bronchitis   . Chronic kidney disease   . COPD (chronic obstructive pulmonary disease) ([place])   . Depression   . Dialysis patient ([place]) 2014  . GERD (gastroesophageal reflux disease)   . Headache   . Hyperlipidemia   . Hypertension   . Obesity   . Renal dialysis device, implant, or graft complication    RIGHT CHEST CATH    Patient Active Problem List   Diagnosis Date Noted  . Altered mental status   . Fall   . Traumatic hematoma of forehead   . Acute respiratory failure with hypercapnia ([place]) 10/06/2016  . Pulmonary edema 09/14/2016  . Respiratory failure ([place]) 07/23/2016  . GIB (gastrointestinal bleeding) 07/09/2016  . Protein-calorie malnutrition, severe 05/29/2016  . Hyperglycemia 05/28/2016  . Pain in limb 10/28/2015  . ESRD on dialysis ([place]) 10/28/2015  . Essential hypertension 10/28/2015  . ESRD on hemodialysis ([place]) 03/29/2015  . HTN (hypertension) 03/29/2015  . COPD (chronic  obstructive pulmonary disease) ([place]) 03/29/2015  . GAD (generalized anxiety disorder) 03/29/2015    Past Surgical History:  Procedure Laterality Date  . ABDOMINAL HYSTERECTOMY    . COLONOSCOPY  2011  . COLONOSCOPY WITH PROPOFOL N/A 09/20/2015   Procedure: COLONOSCOPY WITH PROPOFOL;  Surgeon: Lucilla Lame, MD;  Location: [facility] ENDOSCOPY;  Service: Endoscopy;  Laterality: N/A;  . DIALYSIS FISTULA CREATION    . ESOPHAGOGASTRODUODENOSCOPY N/A 07/13/2016   Procedure: ESOPHAGOGASTRODUODENOSCOPY (EGD);  Surgeon: Lollie Sails, MD;  Location: [facility] ENDOSCOPY;  Service: Endoscopy;  Laterality: N/A;  . ESOPHAGOGASTRODUODENOSCOPY (EGD) WITH PROPOFOL N/A 06/02/2016   Procedure: ESOPHAGOGASTRODUODENOSCOPY (EGD) WITH PROPOFOL;  Surgeon: Manya Silvas, MD;  Location: [facility] ENDOSCOPY;  Service: Endoscopy;  Laterality: N/A;  . PERIPHERAL VASCULAR CATHETERIZATION N/A 05/10/2015   Procedure: A/V Shuntogram/Fistulagram;  Surgeon: Katha Cabal, MD;  Location: [place] CV LAB;  Service: Cardiovascular;  Laterality: N/A;  . PERIPHERAL VASCULAR CATHETERIZATION Left 05/10/2015   Procedure: A/V Shunt Intervention;  Surgeon: Katha Cabal, MD;  Location: [place] CV LAB;  Service: Cardiovascular;  Laterality: Left;  . PERIPHERAL VASCULAR CATHETERIZATION Left 08/23/2015   Procedure: A/V Shuntogram/Fistulagram;  Surgeon: Katha Cabal, MD;  Location: [place] CV LAB;  Service: Cardiovascular;  Laterality: Left;  . PERIPHERAL VASCULAR CATHETERIZATION N/A 08/23/2015   Procedure: A/V Shunt Intervention;  Surgeon: Katha Cabal, MD;  Location: [place] CV LAB;  Service: Cardiovascular;  Laterality: N/A;  . PERIPHERAL VASCULAR CATHETERIZATION  08/23/2015   Procedure: Dialysis/Perma Catheter Insertion;  Surgeon: Katha Cabal,  MD;  Location: [place] CV LAB;  Service: Cardiovascular;;  . PERIPHERAL VASCULAR CATHETERIZATION N/A 01/03/2016   Procedure: Dialysis/Perma Catheter Removal;   Surgeon: Katha Cabal, MD;  Location: [place] CV LAB;  Service: Cardiovascular;  Laterality: N/A;  . REVISON OF ARTERIOVENOUS FISTULA Left 10/28/2015   Procedure: REVISON OF ARTERIOVENOUS FISTULA WITH ARTEGRAFT;  Surgeon: Katha Cabal, MD;  Location: [facility] ORS;  Service: Vascular;  Laterality: Left;  . WOUND DEBRIDEMENT Left 10/28/2015   Procedure: Resection of shoulder cyst ( left );  Surgeon: Katha Cabal, MD;  Location: [facility] ORS;  Service: Vascular;  Laterality: Left;    Allergies Chantix [varenicline] and Sulfa antibiotics  Social History Social History  Substance Use Topics  . Smoking status: Former Smoker    Packs/day: 0.50    Years: 40.00    Types: Cigarettes    Quit date: 11/23/2014  . Smokeless tobacco: Never Used  . Alcohol use No    Review of Systems Constitutional: Negative for fever. Cardiovascular: Negative for chest pain. Respiratory:Positive for shortness of breath Gastrointestinal: Negative for abdominal pain, vomiting and diarrhea. Skin: Negative for rash. Neurological: Negative for headaches, focal weakness or numbness.  10-point ROS otherwise negative.  ____________________________________________   PHYSICAL EXAM:  VITAL SIGNS: ED Triage Vitals  Enc Vitals Group     BP      Pulse      Resp      Temp      Temp src      SpO2      Weight      Height      Head Circumference      Peak Flow      Pain Score      Pain Loc      Pain Edu?      Excl. in [place]?     Constitutional: Drowsy and disoriented Eyes: Conjunctivae are normal. PERRL. Normal extraocular movements. Periorbital edema ENT   Head: Normocephalic and atraumatic.   Nose: No congestion/rhinnorhea.   Mouth/Throat: Mucous membranes are moist. Dried blood is noted in around the mouth   Neck: No stridor. Cardiovascular: Normal rate, regular rhythm. No murmurs, rubs, or gallops. Respiratory: Tachypnea with rales bilaterally Gastrointestinal: Soft and  nontender. Normal bowel sounds Musculoskeletal:  No lower extremity tenderness nor edema. Neurologic:  Confusion, moving extremities well, localizes to pain. Skin:  Skin is warm, dry and intact. No rash noted. ____________________________________________  EKG: Interpreted by me. Sinus rhythm rate of 92 bpm, LVH, old anterior infarct, nonspecific ST segment changes, hyperacute T waves.  ____________________________________________  ED COURSE:  Pertinent labs & imaging results that were available during my care of the patient were reviewed by me and considered in my medical decision making (see chart for details). Clinical Course as of Oct 29 1038  Mon Oct 29, 2016  1039 Patient currently was some improvement on BiPAP  [JW]    Clinical Course User Index [JW] Earleen Newport, MD  Patient presents to ER for altered mental status and difficulty breathing. We will assess with labs and imaging. She will likely need dialysis emergently.  Procedures ____________________________________________   LABS (pertinent positives/negatives)  Labs Reviewed  CBC WITH DIFFERENTIAL/PLATELET - Abnormal; Notable for the following:       Result Value   RDW 18.9 (*)    Neutro Abs 8.5 (*)    Lymphs Abs 0.9 (*)    All other components within normal limits  BRAIN NATRIURETIC PEPTIDE - Abnormal; Notable for the  following:    B Natriuretic Peptide 216.0 (*)    All other components within normal limits  TROPONIN I - Abnormal; Notable for the following:    Troponin I 0.06 (*)    All other components within normal limits  COMPREHENSIVE METABOLIC PANEL - Abnormal; Notable for the following:    Chloride 100 (*)    Glucose, Bld 128 (*)    BUN 50 (*)    Creatinine, Ser 9.79 (*)    Calcium 7.8 (*)    AST 42 (*)    Alkaline Phosphatase 222 (*)    GFR calc non Af Amer 4 (*)    GFR calc Af Amer 4 (*)    Anion gap 17 (*)    All other components within normal limits  BLOOD GAS, VENOUS - Abnormal; Notable  for the following:    pH, Ven 7.19 (*)    pCO2, Ven 80 (*)    pO2, Ven 51.0 (*)    Bicarbonate 30.6 (*)    All other components within normal limits  BLOOD GAS, VENOUS - Abnormal; Notable for the following:    pH, Ven 7.20 (*)    pCO2, Ven 77 (*)    Bicarbonate 30.1 (*)    All other components within normal limits  COMPREHENSIVE METABOLIC PANEL - Abnormal; Notable for the following:    Chloride 98 (*)    Glucose, Bld 124 (*)    BUN 54 (*)    Creatinine, Ser 10.35 (*)    Calcium 7.5 (*)    Alkaline Phosphatase 229 (*)    GFR calc non Af Amer 4 (*)    GFR calc Af Amer 4 (*)    Anion gap 17 (*)    All other components within normal limits  MAGNESIUM - Abnormal; Notable for the following:    Magnesium 1.4 (*)    All other components within normal limits  CBC - Abnormal; Notable for the following:    MCHC 31.8 (*)    RDW 19.3 (*)    All other components within normal limits  CULTURE, BLOOD (ROUTINE X 2)  CULTURE, BLOOD (ROUTINE X 2)  MRSA PCR SCREENING  BLOOD GAS, ARTERIAL   CRITICAL CARE Performed by: Earleen Newport   Total critical care time: 30 minutes  Critical care time was exclusive of separately billable procedures and treating other patients.  Critical care was necessary to treat or prevent imminent or life-threatening deterioration.  Critical care was time spent personally by me on the following activities: development of treatment plan with patient and/or surrogate as well as nursing, discussions with consultants, evaluation of patient's response to treatment, examination of patient, obtaining history from patient or surrogate, ordering and performing treatments and interventions, ordering and review of laboratory studies, ordering and review of radiographic studies, pulse oximetry and re-evaluation of patient's condition.  RADIOLOGY Images were viewed by me  Chest x-ray  IMPRESSION: Cardiomegaly with mild pulmonary vascular  congestion. ____________________________________________  FINAL ASSESSMENT AND PLAN  Dyspnea, acute respiratory failure with hypoxia and hypercapnia, CHF  Plan: Patient with labs and imaging as dictated above. Patient presented with respiratory distress, found to be acidemic likely from CO2 retention. She was placed on BiPAP and has been improving, she is due for dialysis this morning. We will discuss with the ICU team for admission and further management And repeat a blood gas. To this point her breathing has improved.   Earleen Newport, MD   Note: This dictation was prepared with Viviann Spare  dictation. Any transcriptional errors that result from this process are unintentional    Earleen Newport, MD 10/29/16 1506

## 2016-10-29 NOTE — ED Triage Notes (Signed)
Via EMS for SOB, resp appear laboredd on arrival. Mouth noted bloody, appears to have bitten lip. Facial edema. Dialysis patient. When questioned patient states yoes to having dialysis on SAT. 02 99 on 4l, turned to 3l

## 2016-10-29 NOTE — Progress Notes (Signed)
HD initiated without issue via L AVF. Pt falling in and out of sleep currently. BP elevated. Facial edema. Continue to monitor

## 2016-10-29 NOTE — Progress Notes (Signed)
Pre dialysis- Arrived to room all machines tested and passed.

## 2016-10-29 NOTE — H&P (Signed)
[facility] Consultation     ASSESSMENT/PLAN   60 yo female with COPD, ESRD, presented with respiratory distress with hypercapnic respiratory failure.   PULMONARY A:Acute hypoxic and hypercapnic respiratory failure, likely due to AECOPD and volume overload.  --No evidence of pneumonia on CXR.  P:   --Continue steroids, Abx.   CARDIOVASCULAR A: --  RENAL A:  CKD on HD.  --Consult Nephro.   GASTROINTESTINAL A:  --Gi prophylaxis  HEMATOLOGIC A: --  INFECTIOUS A:  --Acute bronchitis.   Micro/culture results:  BCx2 -- UC -- Sputum--  Antibiotics: Azithromycin.   ENDOCRINE A:  Monitor.    P:   --  NEUROLOGIC A:  Acute metabolic encephalopathy due to underlying illness.  P:      MAJOR EVENTS/TEST RESULTS:   Best Practices  DVT Prophylaxis: heparin GI Prophylaxis: famotidine.    ---------------------------------------  ---------------------------------------   Name: Michele Nash MRN: [id number] DOB: 04-09-1956    ADMISSION DATE:  10/29/2016   CHIEF COMPLAINT:  Dyspnea.    HISTORY OF PRESENT ILLNESS:   This is a 60 year old African-American female with a past medical history of end-stage renal disease on dialysis Monday, Wednesdays and Fridays, obstructive sleep apnea on home O2 at 3 L, COPD, depression, and hypertension who presented to the ED with altered mental status following multiple falls at home. History is obtained from patient and ED records. She was recently in the hospital for AECOPD and hypertensive crisis.   Currently the patient is on bipap, she is arousable but does not answer questions, she does not follow commands.     PAST MEDICAL HISTORY :  Past Medical History:  Diagnosis Date  . Anemia   . Apnea, sleep    for sleep study 10/17/15-no cpap yet  . Arthritis   . Bronchitis   . Chronic kidney disease   . COPD (chronic obstructive pulmonary disease) ([place])   . Depression   . Dialysis patient  ([place]) 2014  . GERD (gastroesophageal reflux disease)   . Headache   . Hyperlipidemia   . Hypertension   . Obesity   . Renal dialysis device, implant, or graft complication    RIGHT CHEST CATH   Past Surgical History:  Procedure Laterality Date  . ABDOMINAL HYSTERECTOMY    . COLONOSCOPY  2011  . COLONOSCOPY WITH PROPOFOL N/A 09/20/2015   Procedure: COLONOSCOPY WITH PROPOFOL;  Surgeon: Lucilla Lame, MD;  Location: [facility] ENDOSCOPY;  Service: Endoscopy;  Laterality: N/A;  . DIALYSIS FISTULA CREATION    . ESOPHAGOGASTRODUODENOSCOPY N/A 07/13/2016   Procedure: ESOPHAGOGASTRODUODENOSCOPY (EGD);  Surgeon: Lollie Sails, MD;  Location[facility] ENDOSCOPY;  Service: Endoscopy;  Laterality: N/A;  . ESOPHAGOGASTRODUODENOSCOPY (EGD) WITH PROPOFOL N/A 06/02/2016   Procedure: ESOPHAGOGASTRODUODENOSCOPY (EGD) WITH PROPOFOL;  Surgeon: Manya Silvas, MD;  Location: [facility] ENDOSCOPY;  Service: Endoscopy;  Laterality: N/A;  . PERIPHERAL VASCULAR CATHETERIZATION N/A 05/10/2015   Procedure: A/V Shuntogram/Fistulagram;  Surgeon: Katha Cabal, MD;  Location: [place] CV LAB;  Service: Cardiovascular;  Laterality: N/A;  . PERIPHERAL VASCULAR CATHETERIZATION Left 05/10/2015   Procedure: A/V Shunt Intervention;  Surgeon: Katha Cabal, MD;  Location: [place] CV LAB;  Service: Cardiovascular;  Laterality: Left;  . PERIPHERAL VASCULAR CATHETERIZATION Left 08/23/2015   Procedure: A/V Shuntogram/Fistulagram;  Surgeon: Katha Cabal, MD;  Location: [place] CV LAB;  Service: Cardiovascular;  Laterality: Left;  . PERIPHERAL VASCULAR CATHETERIZATION N/A 08/23/2015   Procedure: A/V Shunt Intervention;  Surgeon: Katha Cabal, MD;  Location: [place] CV LAB;  Service: Cardiovascular;  Laterality: N/A;  . PERIPHERAL VASCULAR CATHETERIZATION  08/23/2015   Procedure: Dialysis/Perma Catheter Insertion;  Surgeon: Katha Cabal, MD;  Location: [place] CV LAB;  Service: Cardiovascular;;  .  PERIPHERAL VASCULAR CATHETERIZATION N/A 01/03/2016   Procedure: Dialysis/Perma Catheter Removal;  Surgeon: Katha Cabal, MD;  Location: [place] CV LAB;  Service: Cardiovascular;  Laterality: N/A;  . REVISON OF ARTERIOVENOUS FISTULA Left 10/28/2015   Procedure: REVISON OF ARTERIOVENOUS FISTULA WITH ARTEGRAFT;  Surgeon: Katha Cabal, MD;  Location: [facility] ORS;  Service: Vascular;  Laterality: Left;  . WOUND DEBRIDEMENT Left 10/28/2015   Procedure: Resection of shoulder cyst ( left );  Surgeon: Katha Cabal, MD;  Location: [facility] ORS;  Service: Vascular;  Laterality: Left;   Prior to Admission medications   Medication Sig Start Date End Date Taking? Authorizing Provider  albuterol (PROVENTIL HFA;VENTOLIN HFA) 108 (90 BASE) MCG/ACT inhaler Inhale 2 puffs into the lungs every 6 (six) hours as needed for wheezing or shortness of breath.    Historical Provider, MD  ALPRAZolam Duanne Moron) 0.5 MG tablet Take 0.5 mg by mouth 2 (two) times daily.    Historical Provider, MD  amLODipine (NORVASC) 10 MG tablet Take 10 mg by mouth daily.     Historical Provider, MD  artificial tears (LACRILUBE) OINT ophthalmic ointment Place into both eyes 4 (four) times daily. 10/11/16   [name], MD  budesonide (PULMICORT) 0.25 MG/2ML nebulizer solution Take 2 mLs (0.25 mg total) by nebulization 2 (two) times daily. 10/11/16   [name], MD  calcium acetate (PHOSLO) 667 MG capsule Take 1,334 mg by mouth 3 (three) times daily.     Historical Provider, MD  calcium carbonate (OS-CAL) 600 MG TABS tablet Take 600 mg by mouth 2 (two) times daily with a meal.    Historical Provider, MD  carvedilol (COREG) 25 MG tablet Take 1 tablet (25 mg total) by mouth 2 (two) times daily with a meal. 04/26/16   Bettey Costa, MD  cinacalcet (SENSIPAR) 30 MG tablet Take 60 mg by mouth daily with breakfast.     Historical Provider, MD  ezetimibe (ZETIA) 10 MG tablet Take 10 mg by mouth daily.     Historical Provider, MD    feeding supplement, ENSURE ENLIVE, (ENSURE ENLIVE) LIQD Take 237 mLs by mouth 3 (three) times daily between meals. 10/11/16   [name], MD  fluticasone (FLONASE) 50 MCG/ACT nasal spray Place 2 sprays into both nostrils daily as needed for rhinitis.    Historical Provider, MD  Fluticasone-Salmeterol (ADVAIR) 100-50 MCG/DOSE AEPB Inhale 1 puff into the lungs 2 (two) times daily.    Historical Provider, MD  Fluticasone-Salmeterol (ADVAIR) 250-50 MCG/DOSE AEPB Inhale 1 puff into the lungs 2 (two) times daily.    Historical Provider, MD  furosemide (LASIX) 40 MG tablet Take 40 mg by mouth every Tuesday, Thursday, Saturday, and Sunday.     Historical Provider, MD  lidocaine-prilocaine (EMLA) cream Apply 1 application topically as needed (prior to accessing port).    Historical Provider, MD  losartan (COZAAR) 100 MG tablet Take 100 mg by mouth daily.     Historical Provider, MD  multivitamin (RENA-VIT) TABS tablet Take 1 tablet by mouth at bedtime.     Historical Provider, MD  omeprazole (PRILOSEC) 20 MG capsule Take 20 mg by mouth daily.    Historical Provider, MD  pantoprazole (PROTONIX) 40 MG tablet Take 1 tablet (40 mg total) by mouth 2 (two)  times daily. 06/02/16   Demetrios Loll, MD  sertraline (ZOLOFT) 50 MG tablet Take 50 mg by mouth daily.     Historical Provider, MD   Allergies  Allergen Reactions  . Chantix [Varenicline]     hallucinations  . Sulfa Antibiotics Itching, Swelling, Rash and Other (See Comments)    Reaction:  Facial/body swelling     FAMILY HISTORY:  Family History  Problem Relation Age of Onset  . Heart disease Mother   . Cancer Father   . Cancer Sister    SOCIAL HISTORY:  reports that she quit smoking about 23 months ago. Her smoking use included Cigarettes. She has a 20.00 pack-year smoking history. She has never used smokeless tobacco. She reports that she does not drink alcohol or use drugs.  REVIEW OF SYSTEMS:   Could not be obtained due to critical  illness.    VITAL SIGNS: Temp:  [98.4 F (36.9 C)] 98.4 F (36.9 C) (12/04 0945) Pulse Rate:  [79-86] 86 (12/04 1045) Resp:  [18-33] 33 (12/04 1045) BP: (176-202)/(99-136) 202/99 (12/04 1030) SpO2:  [93 %-100 %] 100 % (12/04 1045) Weight:  [132 lb (59.9 kg)] 132 lb (59.9 kg) (12/04 0947) HEMODYNAMICS:   VENTILATOR SETTINGS:   INTAKE / OUTPUT: No intake or output data in the 24 hours ending 10/29/16 1157  Physical Examination:   VS: BP (!) 202/99   Pulse 86   Temp 98.4 F (36.9 C) (Oral)   Resp (!) 33   Ht 5\' 5"  (1.651 m)   Wt 132 lb (59.9 kg)   SpO2 100%   BMI 21.97 kg/m   General Appearance: Appears in distress.  Neuro:without focal findings, mental status reduced.  HEENT: PERRLA, EOM intact. Pulmonary: decreased diaphragmatic excursion  CardiovascularNormal S1,S2.  No m/r/g.    Abdomen: Benign, Soft, non-tender, No masses, hepatosplenomegaly, No lymphadenopathy Renal:  No costovertebral tenderness  GU:  Not performed at this time. Endoc: No evident thyromegaly, no signs of acromegaly. Skin:   warm, no rashes, no ecchymosis  Extremities: normal, no cyanosis, clubbing, no edema, warm with normal capillary refill.    LABS: Reviewed   LABORATORY PANEL:   CBC  Recent Labs Lab 10/29/16 0955  WBC 10.0  HGB 12.1  HCT 37.2  PLT 323    Chemistries   Recent Labs Lab 10/29/16 0955  NA 141  K 4.9  CL 100*  CO2 24  GLUCOSE 128*  BUN 50*  CREATININE 9.79*  CALCIUM 7.8*  AST 42*  ALT 17  ALKPHOS 222*  BILITOT 1.1    No results for input(s): GLUCAP in the last 168 hours. No results for input(s): PHART, PCO2ART, PO2ART in the last 168 hours.  Recent Labs Lab 10/29/16 0955  AST 42*  ALT 17  ALKPHOS 222*  BILITOT 1.1  ALBUMIN 4.1    Cardiac Enzymes  Recent Labs Lab 10/29/16 0955  TROPONINI 0.06*    RADIOLOGY:  Dg Chest Port 1 View  Result Date: 10/29/2016 CLINICAL DATA:  Shortness of breath, difficulty holding still EXAM: PORTABLE  CHEST 1 VIEW COMPARISON:  10/06/2016 FINDINGS: There is bilateral interstitial thickening. There is no focal parenchymal opacity. There is no pleural effusion or pneumothorax. There is stable cardiomegaly. The osseous structures are unremarkable. IMPRESSION: Cardiomegaly with mild pulmonary vascular congestion. Electronically Signed   By: Kathreen Devoid   On: 10/29/2016 09:57       --Marda Stalker, MD.  Board Certified in Internal Medicine, Pulmonary Medicine, [place], and Sleep Medicine.  ICU Pager [phone] [facility] Office Number: [phone]  Patricia Pesa, M.D.  Vilinda Boehringer, M.D.  Merton Border, M.D   10/29/2016, 11:57 AM  Critical Care Attestation.  I have personally obtained a history, examined the patient, evaluated laboratory and imaging results, formulated the assessment and plan and placed orders. The Patient requires high complexity decision making for assessment and support, frequent evaluation and titration of therapies, application of advanced monitoring technologies and extensive interpretation of multiple databases. The patient has critical illness that could lead imminently to failure of 1 or more organ systems and requires the highest level of physician preparedness to intervene.  Critical Care Time devoted to patient care services described in this note is 45 minutes and is exclusive of time spent in procedures.

## 2016-10-29 NOTE — Progress Notes (Signed)
MEDICATION RELATED CONSULT NOTE - INITIAL   Pharmacy Consult for Renal adjustment of antimicrobials Indication: renal adjustment  Allergies  Allergen Reactions  . Chantix [Varenicline]     hallucinations  . Sulfa Antibiotics Itching, Swelling, Rash and Other (See Comments)    Reaction:  Facial/body swelling     Patient Measurements: Height: 5\' 5"  (165.1 cm) Weight: 132 lb (59.9 kg) IBW/kg (Calculated) : 57 Adjusted Body Weight:   Vital Signs: Temp: 98.4 F (36.9 C) (12/04 0945) Temp Source: Oral (12/04 0945) BP: 202/99 (12/04 1030) Pulse Rate: 86 (12/04 1045) Intake/Output from previous day: No intake/output data recorded. Intake/Output from this shift: No intake/output data recorded.  Labs:  Recent Labs  10/29/16 0955  WBC 10.0  HGB 12.1  HCT 37.2  PLT 323  CREATININE 9.79*  ALBUMIN 4.1  PROT 7.6  AST 42*  ALT 17  ALKPHOS 222*  BILITOT 1.1   Estimated Creatinine Clearance: 5.4 mL/min (by C-G formula based on SCr of 9.79 mg/dL (H)).   Microbiology: Recent Results (from the past 720 hour(s))  Urine culture     Status: None   Collection Time: 10/06/16  5:17 PM  Result Value Ref Range Status   Specimen Description URINE, RANDOM  Final   Special Requests NONE  Final   Culture NO GROWTH Performed at [facility]   Final   Report Status 10/08/2016 FINAL  Final  MRSA PCR Screening     Status: None   Collection Time: 10/06/16 10:14 PM  Result Value Ref Range Status   MRSA by PCR NEGATIVE NEGATIVE Final    Comment:        The GeneXpert MRSA Assay (FDA approved for NASAL specimens only), is one component of a comprehensive MRSA colonization surveillance program. It is not intended to diagnose MRSA infection nor to guide or monitor treatment for MRSA infections.     Medical History: Past Medical History:  Diagnosis Date  . Anemia   . Apnea, sleep    for sleep study 10/17/15-no cpap yet  . Arthritis   . Bronchitis   . Chronic kidney  disease   . COPD (chronic obstructive pulmonary disease) ([place])   . Depression   . Dialysis patient ([place]) 2014  . GERD (gastroesophageal reflux disease)   . Headache   . Hyperlipidemia   . Hypertension   . Obesity   . Renal dialysis device, implant, or graft complication    RIGHT CHEST CATH    Medications:   (Not in a hospital admission) Scheduled:  . heparin  5,000 Units Subcutaneous Q8H  . ipratropium-albuterol  3 mL Nebulization Q6H  . methylPREDNISolone (SOLU-MEDROL) injection  60 mg Intravenous Q8H    Assessment: Pharmacy consulted to renally adjust antimicrobials  Goal of Therapy:    Plan:  No renal adjustments needed at this time.    Glendale Youngblood D 10/29/2016,12:29 PM

## 2016-10-29 NOTE — Progress Notes (Signed)
Post HD assessment  

## 2016-10-29 NOTE — Care Management Note (Addendum)
Case Management Note  Patient Details  Name: [name] MRN: [id number] Date of Birth: 11/23/1956  Subjective/Objective:      60yo Mrs Michele Nash was admitted from home after several falls at home today and a COPD exacerbation. She is a dialysis patient at [facility] on [address]. She has a RW at home and was discharged home in November 2017 with home health by [facility]. However, per Corene Cornea at [place] Mrs Haneline declined home health services on 10/12/16. She has chronic 3L N/C at home supplied by [facility]. In the ED today she was placed on Bipap and admitted to the ICU. PCP= Dr Felizardo Hoffmann. Pharmacy= [facility] on [address]. She resides at home with her husband. Case management will follow for discharge planning.             a Action/Plan:   Expected Discharge Date:                  Expected Discharge Plan:     In-House Referral:     Discharge planning Services     Post Acute Care Choice:    Choice offered to:     DME Arranged:    DME Agency:     HH Arranged:    HH Agency:     Status of Service:     If discussed at [employer], dates discussed:    Additional Comments:  Dyani Babel A, RN 10/29/2016, 3:52 PM

## 2016-10-30 LAB — BLOOD GAS, ARTERIAL
Acid-Base Excess: 2.6 mmol/L — ABNORMAL HIGH (ref 0.0–2.0)
BICARBONATE: 30.8 mmol/L — AB (ref 20.0–28.0)
Delivery systems: POSITIVE
EXPIRATORY PAP: 6
FIO2: 0.3
INSPIRATORY PAP: 12
O2 SAT: 98.7 %
PH ART: 7.29 — AB (ref 7.350–7.450)
Patient temperature: 37
pCO2 arterial: 64 mmHg — ABNORMAL HIGH (ref 32.0–48.0)
pO2, Arterial: 134 mmHg — ABNORMAL HIGH (ref 83.0–108.0)

## 2016-10-30 LAB — CBC
HCT: 37.3 % (ref 35.0–47.0)
Hemoglobin: 11.9 g/dL — ABNORMAL LOW (ref 12.0–16.0)
MCH: 30.8 pg (ref 26.0–34.0)
MCHC: 31.9 g/dL — AB (ref 32.0–36.0)
MCV: 96.5 fL (ref 80.0–100.0)
Platelets: 291 10*3/uL (ref 150–440)
RBC: 3.86 MIL/uL (ref 3.80–5.20)
RDW: 19.3 % — AB (ref 11.5–14.5)
WBC: 3.6 10*3/uL (ref 3.6–11.0)

## 2016-10-30 LAB — MAGNESIUM: Magnesium: 1.8 mg/dL (ref 1.7–2.4)

## 2016-10-30 LAB — BASIC METABOLIC PANEL
Anion gap: 12 (ref 5–15)
BUN: 41 mg/dL — AB (ref 6–20)
CALCIUM: 7.2 mg/dL — AB (ref 8.9–10.3)
CO2: 30 mmol/L (ref 22–32)
CREATININE: 7.4 mg/dL — AB (ref 0.44–1.00)
Chloride: 95 mmol/L — ABNORMAL LOW (ref 101–111)
GFR calc non Af Amer: 5 mL/min — ABNORMAL LOW (ref >60)
GFR, EST AFRICAN AMERICAN: 6 mL/min — AB (ref >60)
Glucose, Bld: 368 mg/dL — ABNORMAL HIGH (ref 65–99)
Potassium: 4.4 mmol/L (ref 3.5–5.1)
SODIUM: 137 mmol/L (ref 135–145)

## 2016-10-30 MED ORDER — PREDNISONE 10 MG PO TABS: 50.0000 mg | ORAL_TABLET | Freq: Every day | ORAL | 0 refills | Status: DC

## 2016-10-30 MED ORDER — AZITHROMYCIN 250 MG PO TABS
500.0000 mg | ORAL_TABLET | Freq: Every day | ORAL | Status: DC
  Administered 2016-10-30: 500 mg via ORAL
  Filled 2016-10-30: qty 2

## 2016-10-30 MED ORDER — PREDNISONE 20 MG PO TABS: 50.0000 mg | ORAL_TABLET | Freq: Every day | ORAL | Status: DC

## 2016-10-30 NOTE — Care Management (Signed)
HD info faxed to [facility]

## 2016-10-30 NOTE — Progress Notes (Signed)
Subjective:   Hemodialysis yesterday. Tolerated treatment well. UF of 3.5 litres.   She is now longer with shortness of breath. But is complaining of hallucinations.   Objective:  Vital signs in last 24 hours:  Temp:  [97.4 F (36.3 C)-99.1 F (37.3 C)] 97.7 F (36.5 C) (12/05 0433) Pulse Rate:  [63-90] 75 (12/05 0433) Resp:  [12-24] 18 (12/05 0433) BP: (134-210)/(68-164) 153/80 (12/05 0433) SpO2:  [93 %-100 %] 98 % (12/05 0433) FiO2 (%):  [30 %] 30 % (12/04 2000) Weight:  [54.9 kg (121 lb 0.5 oz)-57.3 kg (126 lb 5.2 oz)] 55.6 kg (122 lb 9.6 oz) (12/05 0500)  Weight change:  Filed Weights   10/29/16 1914 10/29/16 2240 10/30/16 0500  Weight: 54.9 kg (121 lb 0.5 oz) 55.7 kg (122 lb 12.8 oz) 55.6 kg (122 lb 9.6 oz)    Intake/Output:    Intake/Output Summary (Last 24 hours) at 10/30/16 1205 Last data filed at 10/30/16 1037  Gross per 24 hour  Intake             1080 ml  Output             3595 ml  Net            -2515 ml     Physical Exam: General: NAD  HEENT [place]/AT  Neck distended neck veins  Pulm/lungs clear  CVS/Heart S1S2 no rubs, regular  Abdomen:  Soft, nontender, BS present  Extremities: No LE edema  Neurologic: Alert, oriented to time, person, place  Skin: No acute rashes  Access: Left arm AV fistula       Basic Metabolic Panel:   Recent Labs Lab 10/29/16 0955 10/29/16 1423 10/30/16 0452  NA 141 141 137  K 4.9 4.8 4.4  CL 100* 98* 95*  CO2 24 26 30   GLUCOSE 128* 124* 368*  BUN 50* 54* 41*  CREATININE 9.79* 10.35* 7.40*  CALCIUM 7.8* 7.5* 7.2*  MG  --  1.4* 1.8     CBC:  Recent Labs Lab 10/29/16 0955 10/29/16 1423 10/30/16 0452  WBC 10.0 9.0 3.6  NEUTROABS 8.5*  --   --   HGB 12.1 12.5 11.9*  HCT 37.2 39.2 37.3  MCV 95.6 96.2 96.5  PLT 323 275 291      Microbiology:  Recent Results (from the past 720 hour(s))  Urine culture     Status: None   Collection Time: 10/06/16  5:17 PM  Result Value Ref Range Status   Specimen  Description URINE, RANDOM  Final   Special Requests NONE  Final   Culture NO GROWTH Performed at [facility]   Final   Report Status 10/08/2016 FINAL  Final  MRSA PCR Screening     Status: None   Collection Time: 10/06/16 10:14 PM  Result Value Ref Range Status   MRSA by PCR NEGATIVE NEGATIVE Final    Comment:        The GeneXpert MRSA Assay (FDA approved for NASAL specimens only), is one component of a comprehensive MRSA colonization surveillance program. It is not intended to diagnose MRSA infection nor to guide or monitor treatment for MRSA infections.   Culture, blood (routine x 2)     Status: None (Preliminary result)   Collection Time: 10/29/16  2:10 PM  Result Value Ref Range Status   Specimen Description BLOOD RIGHT WRIST  Final   Special Requests   Final    BOTTLES DRAWN AEROBIC AND ANAEROBIC AER 7ML ANA 7ML  Culture NO GROWTH < 24 HOURS  Final   Report Status PENDING  Incomplete  Culture, blood (routine x 2)     Status: None (Preliminary result)   Collection Time: 10/29/16  2:10 PM  Result Value Ref Range Status   Specimen Description BLOOD RIGHT ARM  Final   Special Requests   Final    BOTTLES DRAWN AEROBIC AND ANAEROBIC AER 8ML ANA 14ML   Culture NO GROWTH < 24 HOURS  Final   Report Status PENDING  Incomplete  MRSA PCR Screening     Status: None   Collection Time: 10/29/16  2:35 PM  Result Value Ref Range Status   MRSA by PCR NEGATIVE NEGATIVE Final    Comment:        The GeneXpert MRSA Assay (FDA approved for NASAL specimens only), is one component of a comprehensive MRSA colonization surveillance program. It is not intended to diagnose MRSA infection nor to guide or monitor treatment for MRSA infections.     Coagulation Studies: No results for input(s): LABPROT, INR in the last 72 hours.  Urinalysis: No results for input(s): COLORURINE, LABSPEC, PHURINE, GLUCOSEU, HGBUR, BILIRUBINUR, KETONESUR, PROTEINUR, UROBILINOGEN, NITRITE,  LEUKOCYTESUR in the last 72 hours.  Invalid input(s): APPERANCEUR    Imaging: Dg Chest Port 1 View  Result Date: 10/29/2016 CLINICAL DATA:  Shortness of breath, difficulty holding still EXAM: PORTABLE CHEST 1 VIEW COMPARISON:  10/06/2016 FINDINGS: There is bilateral interstitial thickening. There is no focal parenchymal opacity. There is no pleural effusion or pneumothorax. There is stable cardiomegaly. The osseous structures are unremarkable. IMPRESSION: Cardiomegaly with mild pulmonary vascular congestion. Electronically Signed   By: Kathreen Devoid   On: 10/29/2016 09:57     Medications:    . azithromycin  500 mg Oral Daily  . chlorhexidine  15 mL Mouth Rinse BID  . famotidine (PEPCID) IV  20 mg Intravenous Q24H  . heparin  5,000 Units Subcutaneous Q8H  . ipratropium-albuterol  3 mL Nebulization Q6H  . mouth rinse  15 mL Mouth Rinse q12n4p  . methylPREDNISolone (SOLU-MEDROL) injection  60 mg Intravenous Q8H   sodium chloride  Assessment/ Plan:  [name] with hypertension, hyperlipidemia, diastolic heart failure, hypertension, COPD, tobacco abuse, AOCD, SHPTH, LUE AVF, ESRD 02/2013, cocaine abuse Admitted on 10/29/2016 for End stage renal disease ([place]) [N18.6] COPD exacerbation ([place]) [J44.1] Acute respiratory failure with hypoxia and hypercapnia ([place]) [J96.01, J96.02]   [facility] [place]/MWF  1. End-stage renal disease on hemodialysis MWF. Hemodialysis yesterday for flash pulmonary edema.   2. Hypertension: at goal -  amlodipine, carvedilol, hydralazine  3. Anemia of chronic kidney disease:   - hold epo  4. Secondary hyperparathyroidism. Outpatient PTH, calcium and phosphorus from 11/17 at goal. - calcium acetate with meals - Cinacalcet   LOS: 1 Michele Nash 12/5/201712:05 PM

## 2016-10-30 NOTE — Progress Notes (Signed)
Inpatient Diabetes Program Recommendations  AACE/ADA: New Consensus Statement on Inpatient Glycemic Control (2015)  Target Ranges:  Prepandial:   less than 140 mg/dL      Peak postprandial:   less than 180 mg/dL (1-2 hours)      Critically ill patients:  140 - 180 mg/dL   Results for Michele Nash, Michele Nash (MRN [id number]) as of 10/30/2016 12:39  Ref. Range 10/29/2016 09:55 10/29/2016 14:23 10/30/2016 04:52  Glucose Latest Ref Range: 65 - 99 mg/dL 128 (H) 124 (H) 368 (H)   Review of Glycemic Control  Diabetes history: No Outpatient Diabetes medications: NA Current orders for Inpatient glycemic control: None  Inpatient Diabetes Program Recommendations: Correction (SSI): Noted glucose of 368 mg/dl at 4:52 am. While inpatient and ordered steroids, please consider ordering CBGs with Novolog correction scale ACHS.  Thanks, Barnie Alderman, RN, MSN, CDE Diabetes Coordinator Inpatient Diabetes Program [phone] (Team Pager from 8am to 5pm)

## 2016-10-30 NOTE — Progress Notes (Signed)
Pt d/c to home today.  IV removed intact.  Rx's given to pt w/all questions and concerns addressed.  D/C paperwork reviewed and education provided with all questions and concerns addressed.  Pt family member at bedside for transport.

## 2016-10-30 NOTE — Discharge Instructions (Signed)
Resume your HD as before °

## 2016-10-30 NOTE — Progress Notes (Signed)
PHARMACIST - PHYSICIAN COMMUNICATION DR:   Patel CONCERNING: Antibiotic IV to Oral Route Change Policy  RECOMMENDATION: This patient is receiving azithromycin by the intravenous route.  Based on criteria approved by the Pharmacy and Therapeutics Committee, the antibiotic(s) is/are being converted to the equivalent oral dose form(s).   DESCRIPTION: These criteria include:  Patient being treated for a respiratory tract infection, urinary tract infection, cellulitis or clostridium difficile associated diarrhea if on metronidazole  The patient is not neutropenic and does not exhibit a GI malabsorption state  The patient is eating (either orally or via tube) and/or has been taking other orally administered medications for a least 24 hours  The patient is improving clinically and has a Tmax < 100.5  If you have questions about this conversion, please contact the Pharmacy Department  []  ( [phone] )  [place] [x]  ( [phone] )  [facility] []  ( [phone] )  [place] []  ( [phone] )  [facility] []  ( [phone] )  [facility]   

## 2016-10-30 NOTE — Care Management (Signed)
Patient to discharge home today.  Patient has portable o2 tank at bedside.  Patient is declining all home health services at discharge.

## 2016-10-30 NOTE — Discharge Summary (Addendum)
[facility] [place] NAME: Michele Nash    MR#:  [id number]  DATE OF BIRTH:  March 07, 1956  DATE OF ADMISSION:  10/29/2016 ADMITTING PHYSICIAN: Laverle Hobby, MD  DATE OF DISCHARGE: 10/30/16  PRIMARY CARE PHYSICIAN: [name]    ADMISSION DIAGNOSIS:  End stage renal disease ([place]) [N18.6] COPD exacerbation (HCC) [J44.1] Acute respiratory failure with hypoxia and hypercapnia (HCC) [J96.01, J96.02]  DISCHARGE DIAGNOSIS:  Acute on chronic respiratory failure due to COPD flare Mild pulmonary edema-s/p HD and resolved End-stage renal disease on hemodialysis   SECONDARY DIAGNOSIS:   Past Medical History:  Diagnosis Date  . Anemia   . Apnea, sleep    for sleep study 10/17/15-no cpap yet  . Arthritis   . Bronchitis   . Chronic kidney disease   . COPD (chronic obstructive pulmonary disease) ([place])   . Depression   . Dialysis patient ([place]) 2014  . GERD (gastroesophageal reflux disease)   . Headache   . Hyperlipidemia   . Hypertension   . Obesity   . Renal dialysis device, implant, or graft complication    RIGHT CHEST CATH    HOSPITAL COURSE:   60 yo female with COPD, ESRD, presented with respiratory distress with hypercapnic respiratory failure  * Acute hypoxic and hypercapnic respiratory failure, likely due to AECOPD and volume overload-- now doing much better.  -s/p HD --No evidence of pneumonia on CXR.  --Chronic respiratory failure from COPD. --OSA on 3L at home.  --Steroid taper.  -sats 97% on 2liters. -Respiratory status back to baseline.   *ESRD on hemodialysis. Patient had some pulmonary vascular congestion noted on chest x-ray with her symptoms of shortness of breath underwent hemodialysis now symptoms better she feels back to baseline.   *HTN cont home meds.  Overall better. D/c home   CONSULTS OBTAINED:  Treatment Team:  [name], MD  DRUG ALLERGIES:   Allergies  Allergen Reactions   . Chantix [Varenicline]     hallucinations  . Sulfa Antibiotics Itching, Swelling, Rash and Other (See Comments)    Reaction:  Facial/body swelling     DISCHARGE MEDICATIONS:   Current Discharge Medication List    START taking these medications   Details  predniSONE (DELTASONE) 10 MG tablet Take 5 tablets (50 mg total) by mouth daily with breakfast. Take 50 mg taper by 10 mg daily then stop Qty: 15 tablet, Refills: 0      CONTINUE these medications which have NOT CHANGED   Details  albuterol (PROVENTIL HFA;VENTOLIN HFA) 108 (90 BASE) MCG/ACT inhaler Inhale 2 puffs into the lungs every 6 (six) hours as needed for wheezing or shortness of breath.    ALPRAZolam (XANAX) 0.5 MG tablet Take 0.5 mg by mouth 2 (two) times daily.    amLODipine (NORVASC) 10 MG tablet Take 10 mg by mouth daily.     calcium acetate (PHOSLO) 667 MG capsule Take 1,334 mg by mouth 3 (three) times daily.     cinacalcet (SENSIPAR) 30 MG tablet Take 60 mg by mouth daily with breakfast.     ezetimibe (ZETIA) 10 MG tablet Take 10 mg by mouth daily.     fluticasone (FLONASE) 50 MCG/ACT nasal spray Place 2 sprays into both nostrils daily as needed for rhinitis.    furosemide (LASIX) 40 MG tablet Take 40 mg by mouth every Tuesday, Thursday, Saturday, and Sunday.     loratadine (CLARITIN) 10 MG tablet Take 10 mg by mouth daily.  losartan (COZAAR) 100 MG tablet Take 100 mg by mouth daily.     metoprolol (LOPRESSOR) 100 MG tablet Take 100 mg by mouth 2 (two) times daily.    multivitamin (RENA-VIT) TABS tablet Take 1 tablet by mouth at bedtime.     omeprazole (PRILOSEC) 20 MG capsule Take 20 mg by mouth daily.    sertraline (ZOLOFT) 50 MG tablet Take 50 mg by mouth daily.     triamcinolone ointment (KENALOG) 0.1 % Apply 1 application topically 2 (two) times daily.    varenicline (CHANTIX) 1 MG tablet Take 1 mg by mouth 2 (two) times daily.    feeding supplement, ENSURE ENLIVE, (ENSURE ENLIVE) LIQD Take 237  mLs by mouth 3 (three) times daily between meals. Qty: 237 mL, Refills: 12    lidocaine-prilocaine (EMLA) cream Apply 1 application topically as needed (prior to accessing port).        If you experience worsening of your admission symptoms, develop shortness of breath, life threatening emergency, suicidal or homicidal thoughts you must seek medical attention immediately by calling 911 or calling your MD immediately  if symptoms less severe.  You Must read complete instructions/literature along with all the possible adverse reactions/side effects for all the Medicines you take and that have been prescribed to you. Take any new Medicines after you have completely understood and accept all the possible adverse reactions/side effects.   Please note  You were cared for by a hospitalist during your hospital stay. If you have any questions about your discharge medications or the care you received while you were in the hospital after you are discharged, you can call the unit and asked to speak with the hospitalist on call if the hospitalist that took care of you is not available. Once you are discharged, your primary care physician will handle any further medical issues. Please note that NO REFILLS for any discharge medications will be authorized once you are discharged, as it is imperative that you return to your primary care physician (or establish a relationship with a primary care physician if you do not have one) for your aftercare needs so that they can reassess your need for medications and monitor your lab values. Today   SUBJECTIVE   Doc I feel 100% better  VITAL SIGNS:  Blood pressure (!) 149/80, pulse 80, temperature 98.7 F (37.1 C), temperature source Oral, resp. rate 20, height 5\' 5"  (1.651 m), weight 55.6 kg (122 lb 9.6 oz), SpO2 99 %.  I/O:   Intake/Output Summary (Last 24 hours) at 10/30/16 1520 Last data filed at 10/30/16 1037  Gross per 24 hour  Intake             1030 ml   Output             3595 ml  Net            -2565 ml    PHYSICAL EXAMINATION:  GENERAL:  60 y.o.-year-old patient lying in the bed with no acute distress.  EYES: Pupils equal, round, reactive to light and accommodation. No scleral icterus. Extraocular muscles intact.  HEENT: Head atraumatic, normocephalic. Oropharynx and nasopharynx clear.  NECK:  Supple, no jugular venous distention. No thyroid enlargement, no tenderness.  LUNGS: Normal breath sounds bilaterally, no wheezing, rales,rhonchi or crepitation. No use of accessory muscles of respiration.  CARDIOVASCULAR: S1, S2 normal. No murmurs, rubs, or gallops.  ABDOMEN: Soft, non-tender, non-distended. Bowel sounds present. No organomegaly or mass.  EXTREMITIES: No pedal edema, cyanosis, or  clubbing.  NEUROLOGIC: Cranial nerves II through XII are intact. Muscle strength 5/5 in all extremities. Sensation intact. Gait not checked.  PSYCHIATRIC: The patient is alert and oriented x 3.  SKIN: No obvious rash, lesion, or ulcer.   DATA REVIEW:   CBC   Recent Labs Lab 10/30/16 0452  WBC 3.6  HGB 11.9*  HCT 37.3  PLT 291    Chemistries   Recent Labs Lab 10/29/16 1423 10/30/16 0452  NA 141 137  K 4.8 4.4  CL 98* 95*  CO2 26 30  GLUCOSE 124* 368*  BUN 54* 41*  CREATININE 10.35* 7.40*  CALCIUM 7.5* 7.2*  MG 1.4* 1.8  AST 24  --   ALT 19  --   ALKPHOS 229*  --   BILITOT 0.3  --     Microbiology Results   Recent Results (from the past 240 hour(s))  Culture, blood (routine x 2)     Status: None (Preliminary result)   Collection Time: 10/29/16  2:10 PM  Result Value Ref Range Status   Specimen Description BLOOD RIGHT WRIST  Final   Special Requests   Final    BOTTLES DRAWN AEROBIC AND ANAEROBIC AER 7ML ANA 7ML   Culture NO GROWTH < 24 HOURS  Final   Report Status PENDING  Incomplete  Culture, blood (routine x 2)     Status: None (Preliminary result)   Collection Time: 10/29/16  2:10 PM  Result Value Ref Range Status    Specimen Description BLOOD RIGHT ARM  Final   Special Requests   Final    BOTTLES DRAWN AEROBIC AND ANAEROBIC AER 8ML ANA 14ML   Culture NO GROWTH < 24 HOURS  Final   Report Status PENDING  Incomplete  MRSA PCR Screening     Status: None   Collection Time: 10/29/16  2:35 PM  Result Value Ref Range Status   MRSA by PCR NEGATIVE NEGATIVE Final    Comment:        The GeneXpert MRSA Assay (FDA approved for NASAL specimens only), is one component of a comprehensive MRSA colonization surveillance program. It is not intended to diagnose MRSA infection nor to guide or monitor treatment for MRSA infections.     RADIOLOGY:  Dg Chest Port 1 View  Result Date: 10/29/2016 CLINICAL DATA:  Shortness of breath, difficulty holding still EXAM: PORTABLE CHEST 1 VIEW COMPARISON:  10/06/2016 FINDINGS: There is bilateral interstitial thickening. There is no focal parenchymal opacity. There is no pleural effusion or pneumothorax. There is stable cardiomegaly. The osseous structures are unremarkable. IMPRESSION: Cardiomegaly with mild pulmonary vascular congestion. Electronically Signed   By: Kathreen Devoid   On: 10/29/2016 09:57     Management plans discussed with the patient, family and they are in agreement.  CODE STATUS:     Code Status Orders        Start     Ordered   10/29/16 1209  Full code  Continuous     10/29/16 1214    Code Status History    Date Active Date Inactive Code Status Order ID Comments User Context   10/06/2016  8:33 PM 10/11/2016  3:54 PM Full Code [id number]  Mikael Spray, NP ED   09/14/2016  9:19 AM 09/16/2016  4:07 PM Full Code [id number]  Laverle Hobby, MD ED   07/23/2016 11:05 AM 07/24/2016 10:49 PM Full Code [id number]  Wilhelmina Mcardle, MD ED   07/09/2016  6:43 PM 07/13/2016  5:23 PM Full Code [id number]  Lytle Butte, MD ED   05/28/2016  2:54 PM 06/02/2016  2:51 PM Full Code [id number]  Theodoro Grist, MD Inpatient   04/25/2016  6:04 PM 04/26/2016  9:17 PM Full  Code [id number]  Nicholes Mango, MD Inpatient   10/28/2015  6:50 PM 10/30/2015  6:09 PM Full Code [id number]  Theodoro Grist, MD Inpatient   03/30/2015 12:51 AM 03/31/2015  4:44 PM Full Code [id number]  Juluis Mire, MD Inpatient    Advance Directive Documentation   Flowsheet Row Most Recent Value  Type of Advance Directive  Living will  Pre-existing out of facility DNR order (yellow form or pink MOST form)  No data  "MOST" Form in Place?  No data      TOTAL TIME TAKING CARE OF THIS PATIENT: 40 minutes.    Yosiel Thieme M.D on 10/30/2016 at 3:20 PM  Between 7am to 6pm - Pager - [phone] After 6pm go to [email or web address] - password [email or web address] [facility]  [facility]  Office  [phone]  CC: Primary care physician; [name]

## 2016-10-30 NOTE — Progress Notes (Signed)
Spoke with Dr. Tressia Miners transferred service today 10/30/16.  Marda Stalker, [place] Pager [phone] (please enter 7 digits) PCCM Consult Pager [phone] (please enter 7 digits)

## 2016-10-30 NOTE — H&P (Signed)
[facility] Consultation     ASSESSMENT/PLAN   60 yo female with COPD, ESRD, presented with respiratory distress with hypercapnic respiratory failure.   PULMONARY A:Acute hypoxic and hypercapnic respiratory failure, likely due to AECOPD and volume overload-- now doing much better.  --No evidence of pneumonia on CXR.  --Chronic respiratory failure from COPD. --OSA on 3L at home.   P:   --Steroid taper.  --Outpatient follow up in 2-4 weeks.   ---------------------------------------  ---------------------------------------   Name: Michele Nash MRN: [id number] DOB: 1956/10/11    ADMISSION DATE:  10/29/2016   CHIEF COMPLAINT:  Dyspnea.    HISTORY OF PRESENT ILLNESS:   This is a 60 year old African-American female with a past medical history of end-stage renal disease on dialysis Monday, Wednesdays and Fridays, obstructive sleep apnea on home O2 at 3 L, COPD, depression, and hypertension who presented to the ED with altered mental status following multiple falls at home. History is obtained from patient and ED records. She was recently in the hospital for AECOPD and hypertensive crisis.   Currently the patient is on bipap, she is arousable but does not answer questions, she does not follow commands.     PAST MEDICAL HISTORY :  Past Medical History:  Diagnosis Date  . Anemia   . Apnea, sleep    for sleep study 10/17/15-no cpap yet  . Arthritis   . Bronchitis   . Chronic kidney disease   . COPD (chronic obstructive pulmonary disease) ([place])   . Depression   . Dialysis patient ([place]) 2014  . GERD (gastroesophageal reflux disease)   . Headache   . Hyperlipidemia   . Hypertension   . Obesity   . Renal dialysis device, implant, or graft complication    RIGHT CHEST CATH   Past Surgical History:  Procedure Laterality Date  . ABDOMINAL HYSTERECTOMY    . COLONOSCOPY  2011  . COLONOSCOPY WITH PROPOFOL N/A 09/20/2015   Procedure: COLONOSCOPY WITH  PROPOFOL;  Surgeon: Lucilla Lame, MD;  Location: [facility] ENDOSCOPY;  Service: Endoscopy;  Laterality: N/A;  . DIALYSIS FISTULA CREATION    . ESOPHAGOGASTRODUODENOSCOPY N/A 07/13/2016   Procedure: ESOPHAGOGASTRODUODENOSCOPY (EGD);  Surgeon: Lollie Sails, MD;  Location: [facility] ENDOSCOPY;  Service: Endoscopy;  Laterality: N/A;  . ESOPHAGOGASTRODUODENOSCOPY (EGD) WITH PROPOFOL N/A 06/02/2016   Procedure: ESOPHAGOGASTRODUODENOSCOPY (EGD) WITH PROPOFOL;  Surgeon: Manya Silvas, MD;  Location: [facility] ENDOSCOPY;  Service: Endoscopy;  Laterality: N/A;  . PERIPHERAL VASCULAR CATHETERIZATION N/A 05/10/2015   Procedure: A/V Shuntogram/Fistulagram;  Surgeon: Katha Cabal, MD;  Location: [place] CV LAB;  Service: Cardiovascular;  Laterality: N/A;  . PERIPHERAL VASCULAR CATHETERIZATION Left 05/10/2015   Procedure: A/V Shunt Intervention;  Surgeon: Katha Cabal, MD;  Location: [place] CV LAB;  Service: Cardiovascular;  Laterality: Left;  . PERIPHERAL VASCULAR CATHETERIZATION Left 08/23/2015   Procedure: A/V Shuntogram/Fistulagram;  Surgeon: Katha Cabal, MD;  Location: [place] CV LAB;  Service: Cardiovascular;  Laterality: Left;  . PERIPHERAL VASCULAR CATHETERIZATION N/A 08/23/2015   Procedure: A/V Shunt Intervention;  Surgeon: Katha Cabal, MD;  Location: [place] CV LAB;  Service: Cardiovascular;  Laterality: N/A;  . PERIPHERAL VASCULAR CATHETERIZATION  08/23/2015   Procedure: Dialysis/Perma Catheter Insertion;  Surgeon: Katha Cabal, MD;  Location: [place] CV LAB;  Service: Cardiovascular;;  . PERIPHERAL VASCULAR CATHETERIZATION N/A 01/03/2016   Procedure: Dialysis/Perma Catheter Removal;  Surgeon: Katha Cabal, MD;  Location: [place] CV LAB;  Service: Cardiovascular;  Laterality:  N/A;  . REVISON OF ARTERIOVENOUS FISTULA Left 10/28/2015   Procedure: REVISON OF ARTERIOVENOUS FISTULA WITH ARTEGRAFT;  Surgeon: Katha Cabal, MD;  Location: [facility] ORS;   Service: Vascular;  Laterality: Left;  . WOUND DEBRIDEMENT Left 10/28/2015   Procedure: Resection of shoulder cyst ( left );  Surgeon: Katha Cabal, MD;  Location: [facility] ORS;  Service: Vascular;  Laterality: Left;   Prior to Admission medications   Medication Sig Start Date End Date Taking? Authorizing Provider  albuterol (PROVENTIL HFA;VENTOLIN HFA) 108 (90 BASE) MCG/ACT inhaler Inhale 2 puffs into the lungs every 6 (six) hours as needed for wheezing or shortness of breath.    Historical Provider, MD  ALPRAZolam Duanne Moron) 0.5 MG tablet Take 0.5 mg by mouth 2 (two) times daily.    Historical Provider, MD  amLODipine (NORVASC) 10 MG tablet Take 10 mg by mouth daily.     Historical Provider, MD  artificial tears (LACRILUBE) OINT ophthalmic ointment Place into both eyes 4 (four) times daily. 10/11/16   [name], MD  budesonide (PULMICORT) 0.25 MG/2ML nebulizer solution Take 2 mLs (0.25 mg total) by nebulization 2 (two) times daily. 10/11/16   [name], MD  calcium acetate (PHOSLO) 667 MG capsule Take 1,334 mg by mouth 3 (three) times daily.     Historical Provider, MD  calcium carbonate (OS-CAL) 600 MG TABS tablet Take 600 mg by mouth 2 (two) times daily with a meal.    Historical Provider, MD  carvedilol (COREG) 25 MG tablet Take 1 tablet (25 mg total) by mouth 2 (two) times daily with a meal. 04/26/16   Bettey Costa, MD  cinacalcet (SENSIPAR) 30 MG tablet Take 60 mg by mouth daily with breakfast.     Historical Provider, MD  ezetimibe (ZETIA) 10 MG tablet Take 10 mg by mouth daily.     Historical Provider, MD  feeding supplement, ENSURE ENLIVE, (ENSURE ENLIVE) LIQD Take 237 mLs by mouth 3 (three) times daily between meals. 10/11/16   [name], MD  fluticasone (FLONASE) 50 MCG/ACT nasal spray Place 2 sprays into both nostrils daily as needed for rhinitis.    Historical Provider, MD  Fluticasone-Salmeterol (ADVAIR) 100-50 MCG/DOSE AEPB Inhale 1 puff into the lungs 2 (two)  times daily.    Historical Provider, MD  Fluticasone-Salmeterol (ADVAIR) 250-50 MCG/DOSE AEPB Inhale 1 puff into the lungs 2 (two) times daily.    Historical Provider, MD  furosemide (LASIX) 40 MG tablet Take 40 mg by mouth every Tuesday, Thursday, Saturday, and Sunday.     Historical Provider, MD  lidocaine-prilocaine (EMLA) cream Apply 1 application topically as needed (prior to accessing port).    Historical Provider, MD  losartan (COZAAR) 100 MG tablet Take 100 mg by mouth daily.     Historical Provider, MD  multivitamin (RENA-VIT) TABS tablet Take 1 tablet by mouth at bedtime.     Historical Provider, MD  omeprazole (PRILOSEC) 20 MG capsule Take 20 mg by mouth daily.    Historical Provider, MD  pantoprazole (PROTONIX) 40 MG tablet Take 1 tablet (40 mg total) by mouth 2 (two) times daily. 06/02/16   Demetrios Loll, MD  sertraline (ZOLOFT) 50 MG tablet Take 50 mg by mouth daily.     Historical Provider, MD   Allergies  Allergen Reactions  . Chantix [Varenicline]     hallucinations  . Sulfa Antibiotics Itching, Swelling, Rash and Other (See Comments)    Reaction:  Facial/body swelling     FAMILY HISTORY:  Family History  Problem Relation Age of Onset  . Heart disease Mother   . Cancer Father   . Cancer Sister    SOCIAL HISTORY:  reports that she quit smoking about 23 months ago. Her smoking use included Cigarettes. She has a 20.00 pack-year smoking history. She has never used smokeless tobacco. She reports that she does not drink alcohol or use drugs.  REVIEW OF SYSTEMS:   Could not be obtained due to critical illness.    VITAL SIGNS: Temp:  [97.4 F (36.3 C)-99.1 F (37.3 C)] 97.7 F (36.5 C) (12/05 0433) Pulse Rate:  [63-90] 75 (12/05 0433) Resp:  [12-24] 18 (12/05 0433) BP: (134-210)/(68-164) 153/80 (12/05 0433) SpO2:  [93 %-100 %] 98 % (12/05 0433) FiO2 (%):  [30 %] 30 % (12/04 2000) Weight:  [121 lb 0.5 oz (54.9 kg)-126 lb 5.2 oz (57.3 kg)] 122 lb 9.6 oz (55.6 kg) (12/05  0500) HEMODYNAMICS:   VENTILATOR SETTINGS: FiO2 (%):  [30 %] 30 % INTAKE / OUTPUT:  Intake/Output Summary (Last 24 hours) at 10/30/16 1151 Last data filed at 10/30/16 1037  Gross per 24 hour  Intake             1080 ml  Output             3595 ml  Net            -2515 ml    Physical Examination:   VS: BP (!) 153/80 (BP Location: Right Arm)   Pulse 75   Temp 97.7 F (36.5 C) (Axillary)   Resp 18   Ht 5\' 5"  (1.651 m)   Wt 122 lb 9.6 oz (55.6 kg)   SpO2 98%   BMI 20.40 kg/m   General Appearance: Appears in distress.  Neuro:without focal findings, mental status reduced.  HEENT: PERRLA, EOM intact. Pulmonary: decreased diaphragmatic excursion  CardiovascularNormal S1,S2.  No m/r/g.    Abdomen: Benign, Soft, non-tender, No masses, hepatosplenomegaly, No lymphadenopathy Renal:  No costovertebral tenderness  GU:  Not performed at this time. Endoc: No evident thyromegaly, no signs of acromegaly. Skin:   warm, no rashes, no ecchymosis  Extremities: normal, no cyanosis, clubbing, no edema, warm with normal capillary refill.    LABS: Reviewed   LABORATORY PANEL:   CBC  Recent Labs Lab 10/30/16 0452  WBC 3.6  HGB 11.9*  HCT 37.3  PLT 291    Chemistries   Recent Labs Lab 10/29/16 1423 10/30/16 0452  NA 141 137  K 4.8 4.4  CL 98* 95*  CO2 26 30  GLUCOSE 124* 368*  BUN 54* 41*  CREATININE 10.35* 7.40*  CALCIUM 7.5* 7.2*  MG 1.4* 1.8  AST 24  --   ALT 19  --   ALKPHOS 229*  --   BILITOT 0.3  --      Recent Labs Lab 10/29/16 1403  GLUCAP 122*    Recent Labs Lab 10/29/16 1700 10/30/16 0443  PHART 7.29* 7.29*  PCO2ART 67* 64*  PO2ART 84 134*    Recent Labs Lab 10/29/16 0955 10/29/16 1423  AST 42* 24  ALT 17 19  ALKPHOS 222* 229*  BILITOT 1.1 0.3  ALBUMIN 4.1 4.1    Cardiac Enzymes  Recent Labs Lab 10/29/16 0955  TROPONINI 0.06*    RADIOLOGY:  Dg Chest Port 1 View  Result Date: 10/29/2016 CLINICAL DATA:  Shortness of  breath, difficulty holding still EXAM: PORTABLE CHEST 1 VIEW COMPARISON:  10/06/2016 FINDINGS: There is bilateral interstitial thickening. There is no focal  parenchymal opacity. There is no pleural effusion or pneumothorax. There is stable cardiomegaly. The osseous structures are unremarkable. IMPRESSION: Cardiomegaly with mild pulmonary vascular congestion. Electronically Signed   By: Kathreen Devoid   On: 10/29/2016 09:57       --Marda Stalker, MD.  Board Certified in Internal Medicine, Pulmonary Medicine, [place], and Sleep Medicine.  ICU Pager [phone]  Pulmonary and Critical Care Office Number: [phone]  Patricia Pesa, M.D.  Vilinda Boehringer, M.D.  Merton Border, M.D   10/30/2016, 11:51 AM  [place].  I have personally obtained a history, examined the patient, evaluated laboratory and imaging results, formulated the assessment and plan and placed orders. The Patient requires high complexity decision making for assessment and support, frequent evaluation and titration of therapies, application of advanced monitoring technologies and extensive interpretation of multiple databases. The patient has critical illness that could lead imminently to failure of 1 or more organ systems and requires the highest level of physician preparedness to intervene.  Critical Care Time devoted to patient care services described in this note is 45 minutes and is exclusive of time spent in procedures.

## 2016-11-03 LAB — CULTURE, BLOOD (ROUTINE X 2)
CULTURE: NO GROWTH
CULTURE: NO GROWTH

## 2016-11-11 NOTE — Progress Notes (Signed)
* [facility]     Assessment and Plan:  AECOPD and volume overload-- now doing much better.  --Recently DC from hospital with steroid taper. Now doing better.  --Continue current regimen of advair once daily.  --Acute on chronic respiratory failure due to multiple factors. Continue oxygen.   --OSA  --Sleep study 10/17/15; AHI 60. --Not using CPAP, she did not like the electrodes in her head so did not go back for the titration; and refuses to go back for another sleep study.  --Will attempt auto-PAP if covered. If not will see if we can get an Home sleep study.   Nicotine Abuse.  --She has stopped smoking since getting out of the hospital.  --discussed the importance of continued smoking cessation, 3 min spent in discussion.   Acute on chronic respiratory failure. -Multifactorial due to COPD, volume overload, end-stage renal disease. -Continue oxygen at 3 L.  Date: 11/11/2016  MRN# [id number] Michele Nash 1956-05-18   Michele Nash is a 60 y.o. old female seen in follow up for chief complaint of  Chief Complaint  Patient presents with  . Hospitalization Follow-up    d/c 10/30/16.     HPI:   This is a 60 year old African-American female with a past medical history of end-stage renal disease on dialysis Monday, Wednesdays and Fridays, obstructive sleep apnea on home O2 at 3 L, COPD, depression, and hypertension. She was seen in the hospital recently and discharged for ESRD with AECOPD. She was noted to have a history of OSA and COPD.  Since getting out of the hospital she has been feeling well, she feels that the breathing has been doing well. She notes dyspnea with walking up steps or housework. She is using advair HFA 2 puffs once daily, and feels that it helps with her breathing but not sure.  She is using 3 L of oxygen at home, and take it with her when travelling.     She is not on cpap. She had a sleep study but did not like having the electrodes  in her hair so did not go back for the cpap titration study.   Sleep study 10/17/15; AHI 60.   Medication:   Outpatient Encounter Prescriptions as of 11/13/2016  Medication Sig  . albuterol (PROVENTIL HFA;VENTOLIN HFA) 108 (90 BASE) MCG/ACT inhaler Inhale 2 puffs into the lungs every 6 (six) hours as needed for wheezing or shortness of breath.  . ALPRAZolam (XANAX) 0.5 MG tablet Take 0.5 mg by mouth 2 (two) times daily.  Marland Kitchen amLODipine (NORVASC) 10 MG tablet Take 10 mg by mouth daily.   . calcium acetate (PHOSLO) 667 MG capsule Take 1,334 mg by mouth 3 (three) times daily.   . cinacalcet (SENSIPAR) 30 MG tablet Take 60 mg by mouth daily with breakfast.   . ezetimibe (ZETIA) 10 MG tablet Take 10 mg by mouth daily.   . feeding supplement, ENSURE ENLIVE, (ENSURE ENLIVE) LIQD Take 237 mLs by mouth 3 (three) times daily between meals.  . fluticasone (FLONASE) 50 MCG/ACT nasal spray Place 2 sprays into both nostrils daily as needed for rhinitis.  . furosemide (LASIX) 40 MG tablet Take 40 mg by mouth every Tuesday, Thursday, Saturday, and Sunday.   . lidocaine-prilocaine (EMLA) cream Apply 1 application topically as needed (prior to accessing port).  . loratadine (CLARITIN) 10 MG tablet Take 10 mg by mouth daily.  Marland Kitchen losartan (COZAAR) 100 MG tablet Take 100 mg by mouth daily.   . metoprolol (  LOPRESSOR) 100 MG tablet Take 100 mg by mouth 2 (two) times daily.  . multivitamin (RENA-VIT) TABS tablet Take 1 tablet by mouth at bedtime.   Marland Kitchen omeprazole (PRILOSEC) 20 MG capsule Take 20 mg by mouth daily.  . predniSONE (DELTASONE) 10 MG tablet Take 5 tablets (50 mg total) by mouth daily with breakfast. Take 50 mg taper by 10 mg daily then stop  . sertraline (ZOLOFT) 50 MG tablet Take 50 mg by mouth daily.   Marland Kitchen triamcinolone ointment (KENALOG) 0.1 % Apply 1 application topically 2 (two) times daily.  . varenicline (CHANTIX) 1 MG tablet Take 1 mg by mouth 2 (two) times daily.   No facility-administered encounter  medications on file as of 11/13/2016.      Allergies:  Chantix [varenicline] and Sulfa antibiotics  Review of Systems: Gen:  Denies  fever, sweats. HEENT: Denies blurred vision. Cvc:  No dizziness, chest pain or heaviness Resp:   Denies cough or sputum porduction. Gi: Denies swallowing difficulty, stomach pain. constipation, bowel incontinence Gu:  Denies bladder incontinence, burning urine Ext:   No Joint pain, stiffness. Skin: No skin rash, easy bruising. Endoc:  No polyuria, polydipsia. Psych: No depression, insomnia. Other:  All other systems were reviewed and found to be negative other than what is mentioned in the HPI.   Physical Examination:   VS: BP 132/80 (BP Location: Left Arm, Cuff Size: Normal)   Pulse 82   Ht 5\' 5"  (1.651 m)   Wt 59.8 kg (131 lb 12.8 oz)   SpO2 100%   BMI 21.93 kg/m   General Appearance: No distress  Neuro:without focal findings,  speech normal,  HEENT: PERRLA, EOM intact. Pulmonary: normal breath sounds, No wheezing.   CardiovascularNormal S1,S2.  No m/r/g.   Abdomen: Benign, Soft, non-tender. Renal:  No costovertebral tenderness  GU:  Not performed at this time. Endoc: No evident thyromegaly, no signs of acromegaly. Skin:   warm, no rash. Extremities: normal, no cyanosis, clubbing.   LABORATORY PANEL:   CBC No results for input(s): WBC, HGB, HCT, PLT in the last 168 hours. ------------------------------------------------------------------------------------------------------------------  Chemistries  No results for input(s): NA, K, CL, CO2, GLUCOSE, BUN, CREATININE, CALCIUM, MG, AST, ALT, ALKPHOS, BILITOT in the last 168 hours.  Invalid input(s): GFRCGP ------------------------------------------------------------------------------------------------------------------  Cardiac Enzymes No results for input(s): TROPONINI in the last 168 hours. ------------------------------------------------------------  RADIOLOGY:   No results  found for this or any previous visit. Results for orders placed during the hospital encounter of 05/02/16  DG Chest 2 View   Narrative CLINICAL DATA:  60 year old female with history of shortness of breath and chest pain starting earlier this morning after waking. Pressure and heaviness in the chest.  EXAM: CHEST  2 VIEW  COMPARISON:  Chest x-ray 04/25/2016.  FINDINGS: Mild diffuse peribronchial cuffing. Lung volumes are within normal limits. No acute consolidative airspace disease. No pleural effusions. No pneumothorax. No definite suspicious appearing pulmonary nodules or masses. No cephalization of the pulmonary vasculature. Mild cardiomegaly. The patient is rotated to the right on today's exam, resulting in distortion of the mediastinal contours and reduced diagnostic sensitivity and specificity for mediastinal pathology. Atherosclerosis in the thoracic aorta.  IMPRESSION: 1. Mild diffuse peribronchial cuffing may suggest an acute bronchitis. 2. Mild cardiomegaly. 3. Atherosclerosis.   Electronically Signed   By: Vinnie Langton M.D.   On: 05/02/2016 11:45    ------------------------------------------------------------------------------------------------------------------  Thank  you for allowing [facility] to assist in the care of your patient. Our  recommendations are noted above.  Please contact us if we can be of further service.   Marda Stalker, MD.  [facility] Office Number: [phone]  Patricia Pesa, M.D.  Vilinda Boehringer, M.D.  Merton Border, M.D  11/11/2016

## 2016-11-13 ENCOUNTER — Ambulatory Visit (INDEPENDENT_AMBULATORY_CARE_PROVIDER_SITE_OTHER): Payer: Medicare Other | Admitting: Internal Medicine

## 2016-11-13 ENCOUNTER — Encounter: Payer: Self-pay | Admitting: Internal Medicine

## 2016-11-13 VITALS — BP 132/80 | HR 82 | Ht 65.0 in | Wt 131.8 lb

## 2016-11-13 DIAGNOSIS — J9621 Acute and chronic respiratory failure with hypoxia: Secondary | ICD-10-CM | POA: Diagnosis not present

## 2016-11-13 DIAGNOSIS — G4733 Obstructive sleep apnea (adult) (pediatric): Secondary | ICD-10-CM | POA: Diagnosis not present

## 2016-11-13 DIAGNOSIS — J438 Other emphysema: Secondary | ICD-10-CM | POA: Diagnosis not present

## 2016-11-13 DIAGNOSIS — Z720 Tobacco use: Secondary | ICD-10-CM

## 2016-11-13 NOTE — Patient Instructions (Addendum)
--  Continue advair.   --We will order a CPAP for treatment of sleep apnea. However, since it has been more than a year since her previous sleep study, there insurance may not cover this. Auto-PAP 5-20.

## 2016-11-15 ENCOUNTER — Telehealth: Payer: Self-pay | Admitting: Internal Medicine

## 2016-11-15 DIAGNOSIS — G4719 Other hypersomnia: Secondary | ICD-10-CM

## 2016-11-15 NOTE — Telephone Encounter (Signed)
Per Darlina Guys with [facility], since patient's study is over a year old, she will need to repeat Sleep Study in order to proceed with CPAP set up.  Called and spoke with patient and she is aware. As per Dr. Mathis Fare OV note, if a new study is required, pt can do a HST.  I spoke with patient and she prefers to have the HST over the in lab study.    Please order HST as per office protocol. Rhonda J Cobb

## 2016-11-15 NOTE — Telephone Encounter (Signed)
Order placed. Nothing further needed. 

## 2016-11-22 ENCOUNTER — Encounter (INDEPENDENT_AMBULATORY_CARE_PROVIDER_SITE_OTHER): Payer: Self-pay | Admitting: Vascular Surgery

## 2016-11-22 ENCOUNTER — Ambulatory Visit (INDEPENDENT_AMBULATORY_CARE_PROVIDER_SITE_OTHER): Payer: Medicare Other | Admitting: Vascular Surgery

## 2016-11-22 VITALS — BP 150/85 | HR 81 | Resp 16 | Ht 62.0 in | Wt 130.0 lb

## 2016-11-22 DIAGNOSIS — Z992 Dependence on renal dialysis: Secondary | ICD-10-CM

## 2016-11-22 DIAGNOSIS — E782 Mixed hyperlipidemia: Secondary | ICD-10-CM

## 2016-11-22 DIAGNOSIS — N186 End stage renal disease: Secondary | ICD-10-CM

## 2016-11-22 DIAGNOSIS — I10 Essential (primary) hypertension: Secondary | ICD-10-CM | POA: Diagnosis not present

## 2016-11-22 DIAGNOSIS — J441 Chronic obstructive pulmonary disease with (acute) exacerbation: Secondary | ICD-10-CM

## 2016-11-22 NOTE — Progress Notes (Signed)
MRN : [id number]  Michele Nash is a 60 y.o. (Sep 10, 1956) female who presents with chief complaint of  Chief Complaint  Patient presents with  . Re-evaluation    6 month follow up  .  History of Present Illness: The patient returns to the office for followup of their dialysis access. The function of the access has been stable. The patient denies increased bleeding time or increased recirculation. Patient denies difficulty with cannulation. The patient denies hand pain or other symptoms consistent with steal phenomena. No significant arm swelling.  The patient denies redness or swelling at the access site. The patient denies fever or chills at home or while on dialysis.  She did have an interval change in her overall health care status she experienced severe vertigo and was treated in the intensive care unit for 3 days. At this point most of the symptoms have resolved although she still is having some mild syncopal-like symptoms.  The patient denies amaurosis fugax or recent TIA symptoms. There are no recent neurological changes noted. The patient denies claudication symptoms or rest pain symptoms. The patient denies history of DVT, PE or superficial thrombophlebitis. The patient denies recent episodes of angina or shortness of breath.  Duplex ultrasound of the AV access shows a patent access with uniform velocities. No focal hemodynamically significant stenosis. No change compared to last study  Current Meds  Medication Sig  . albuterol (PROVENTIL HFA;VENTOLIN HFA) 108 (90 BASE) MCG/ACT inhaler Inhale 2 puffs into the lungs every 6 (six) hours as needed for wheezing or shortness of breath.  . ALPRAZolam (XANAX) 0.5 MG tablet Take 0.5 mg by mouth 2 (two) times daily.  Marland Kitchen amLODipine (NORVASC) 10 MG tablet Take 10 mg by mouth daily.   . calcium acetate (PHOSLO) 667 MG capsule Take 1,334 mg by mouth 3 (three) times daily.   . cinacalcet (SENSIPAR) 30 MG tablet Take 60 mg by mouth daily  with breakfast.   . ezetimibe (ZETIA) 10 MG tablet Take 10 mg by mouth daily.   . feeding supplement, ENSURE ENLIVE, (ENSURE ENLIVE) LIQD Take 237 mLs by mouth 3 (three) times daily between meals.  . fluticasone (FLONASE) 50 MCG/ACT nasal spray Place 2 sprays into both nostrils daily as needed for rhinitis.  . furosemide (LASIX) 40 MG tablet Take 40 mg by mouth every Tuesday, Thursday, Saturday, and Sunday.   . lidocaine-prilocaine (EMLA) cream Apply 1 application topically as needed (prior to accessing port).  . losartan (COZAAR) 100 MG tablet Take 100 mg by mouth daily.   . metoprolol (LOPRESSOR) 100 MG tablet Take 100 mg by mouth 2 (two) times daily.  . multivitamin (RENA-VIT) TABS tablet Take 1 tablet by mouth at bedtime.   Marland Kitchen omeprazole (PRILOSEC) 20 MG capsule Take 20 mg by mouth daily.  . sertraline (ZOLOFT) 50 MG tablet Take 50 mg by mouth daily.   Marland Kitchen triamcinolone ointment (KENALOG) 0.1 % Apply 1 application topically 2 (two) times daily.    Past Medical History:  Diagnosis Date  . Anemia   . Apnea, sleep    for sleep study 10/17/15-no cpap yet  . Arthritis   . Bronchitis   . Chronic kidney disease   . COPD (chronic obstructive pulmonary disease) ([place])   . Depression   . Dialysis patient ([place]) 2014  . GERD (gastroesophageal reflux disease)   . Headache   . Hyperlipidemia   . Hypertension   . Obesity   . Renal dialysis device, implant, or graft complication  RIGHT CHEST CATH    Past Surgical History:  Procedure Laterality Date  . ABDOMINAL HYSTERECTOMY    . COLONOSCOPY  2011  . COLONOSCOPY WITH PROPOFOL N/A 09/20/2015   Procedure: COLONOSCOPY WITH PROPOFOL;  Surgeon: Lucilla Lame, MD;  Location: [facility] ENDOSCOPY;  Service: Endoscopy;  Laterality: N/A;  . DIALYSIS FISTULA CREATION    . ESOPHAGOGASTRODUODENOSCOPY N/A 07/13/2016   Procedure: ESOPHAGOGASTRODUODENOSCOPY (EGD);  Surgeon: Lollie Sails, MD;  Location: [facility] ENDOSCOPY;  Service: Endoscopy;  Laterality: N/A;    . ESOPHAGOGASTRODUODENOSCOPY (EGD) WITH PROPOFOL N/A 06/02/2016   Procedure: ESOPHAGOGASTRODUODENOSCOPY (EGD) WITH PROPOFOL;  Surgeon: Manya Silvas, MD;  Location: [facility] ENDOSCOPY;  Service: Endoscopy;  Laterality: N/A;  . PERIPHERAL VASCULAR CATHETERIZATION N/A 05/10/2015   Procedure: A/V Shuntogram/Fistulagram;  Surgeon: Katha Cabal, MD;  Location: [place] CV LAB;  Service: Cardiovascular;  Laterality: N/A;  . PERIPHERAL VASCULAR CATHETERIZATION Left 05/10/2015   Procedure: A/V Shunt Intervention;  Surgeon: Katha Cabal, MD;  Location: [place] CV LAB;  Service: Cardiovascular;  Laterality: Left;  . PERIPHERAL VASCULAR CATHETERIZATION Left 08/23/2015   Procedure: A/V Shuntogram/Fistulagram;  Surgeon: Katha Cabal, MD;  Location: [place] CV LAB;  Service: Cardiovascular;  Laterality: Left;  . PERIPHERAL VASCULAR CATHETERIZATION N/A 08/23/2015   Procedure: A/V Shunt Intervention;  Surgeon: Katha Cabal, MD;  Location: [place] CV LAB;  Service: Cardiovascular;  Laterality: N/A;  . PERIPHERAL VASCULAR CATHETERIZATION  08/23/2015   Procedure: Dialysis/Perma Catheter Insertion;  Surgeon: Katha Cabal, MD;  Location: [place] CV LAB;  Service: Cardiovascular;;  . PERIPHERAL VASCULAR CATHETERIZATION N/A 01/03/2016   Procedure: Dialysis/Perma Catheter Removal;  Surgeon: Katha Cabal, MD;  Location: [place] CV LAB;  Service: Cardiovascular;  Laterality: N/A;  . REVISON OF ARTERIOVENOUS FISTULA Left 10/28/2015   Procedure: REVISON OF ARTERIOVENOUS FISTULA WITH ARTEGRAFT;  Surgeon: Katha Cabal, MD;  Location: [facility] ORS;  Service: Vascular;  Laterality: Left;  . WOUND DEBRIDEMENT Left 10/28/2015   Procedure: Resection of shoulder cyst ( left );  Surgeon: Katha Cabal, MD;  Location: [facility] ORS;  Service: Vascular;  Laterality: Left;    Social History Social History  Substance Use Topics  . Smoking status: Former Smoker    Packs/day: 0.50     Years: 40.00    Types: Cigarettes    Quit date: 10/21/2016  . Smokeless tobacco: Never Used  . Alcohol use No    Family History Family History  Problem Relation Age of Onset  . Heart disease Mother   . Cancer Father   . Cancer Sister   No family history of bleeding/clotting disorders, porphyria or autoimmune disease   Allergies  Allergen Reactions  . Chantix [Varenicline]     hallucinations  . Sulfa Antibiotics Itching, Swelling, Rash and Other (See Comments)    Reaction:  Facial/body swelling      REVIEW OF SYSTEMS (Negative unless checked)  Constitutional: [] Weight loss  [] Fever  [] Chills Cardiac: [] Chest pain   [] Chest pressure   [] Palpitations   [] Shortness of breath when laying flat   [] Shortness of breath with exertion. Vascular:  [] Pain in legs with walking   [] Pain in legs at rest  [] History of DVT   [] Phlebitis   [] Swelling in legs   [] Varicose veins   [] Non-healing ulcers Pulmonary:   [] Uses home oxygen   [] Productive cough   [] Hemoptysis   [] Wheeze  [] COPD   [] Asthma Neurologic:  [x] Dizziness   [] Seizures   [] History of stroke   [] History  of TIA  [] Aphasia   [] Vissual changes   [] Weakness or numbness in arm   [] Weakness or numbness in leg Musculoskeletal:   [] Joint swelling   [] Joint pain   [] Low back pain Hematologic:  [] Easy bruising  [] Easy bleeding   [] Hypercoagulable state   [] Anemic Gastrointestinal:  [] Diarrhea   [] Vomiting  [] Gastroesophageal reflux/heartburn   [] Difficulty swallowing. Genitourinary:  [x] Chronic kidney disease   [] Difficult urination  [] Frequent urination   [] Blood in urine Skin:  [] Rashes   [] Ulcers  Psychological:  [] History of anxiety   []  History of major depression.  Physical Examination  Vitals:   11/22/16 0917  BP: (!) 150/85  Pulse: 81  Resp: 16  Weight: 59 kg (130 lb)  Height: 5\' 2"  (1.575 m)   Body mass index is 23.78 kg/m. Gen: WD/WN, NAD Head: [place]/AT, No temporalis wasting.  Ear/Nose/Throat: Hearing grossly  intact, nares w/o erythema or drainage, poor dentition Eyes: PER, EOMI, sclera nonicteric.  Neck: Supple, no masses.  No bruit or JVD.  Pulmonary:  Good air movement, clear to auscultation bilaterally, no use of accessory muscles.  Cardiac: RRR, normal S1, S2, no Murmurs. Vascular: Left brachiocephalic fistula good thrill good bruit Vessel Right Left  Radial 1+ Palpable Trace Palpable  Ulnar Not Palpable Not Palpable  Brachial Palpable Palpable  Carotid Palpable Palpable  Femoral Palpable Palpable  Popliteal Palpable Palpable  PT Palpable Palpable  DP Palpable Palpable   Gastrointestinal: soft, non-distended. No guarding/no peritoneal signs.  Musculoskeletal: M/S 5/5 throughout.  No deformity or atrophy.  Neurologic: CN 2-12 intact. Pain and light touch intact in extremities.  Symmetrical.  Speech is fluent. Motor exam as listed above. Psychiatric: Judgment intact, Mood & affect appropriate for pt's clinical situation. Dermatologic: No rashes or ulcers noted.  No changes consistent with cellulitis. Lymph : No Cervical lymphadenopathy, no lichenification or skin changes of chronic lymphedema.  CBC Lab Results  Component Value Date   WBC 3.6 10/30/2016   HGB 11.9 (L) 10/30/2016   HCT 37.3 10/30/2016   MCV 96.5 10/30/2016   PLT 291 10/30/2016    BMET    Component Value Date/Time   NA 137 10/30/2016 0452   NA 141 10/10/2014 1122   K 4.4 10/30/2016 0452   K 3.7 10/10/2014 1122   CL 95 (L) 10/30/2016 0452   CL 96 (L) 10/10/2014 1122   CO2 30 10/30/2016 0452   CO2 37 (H) 10/10/2014 1122   GLUCOSE 368 (H) 10/30/2016 0452   GLUCOSE 125 (H) 10/10/2014 1122   BUN 41 (H) 10/30/2016 0452   BUN 40 (H) 10/10/2014 1122   CREATININE 7.40 (H) 10/30/2016 0452   CREATININE 7.68 (H) 10/10/2014 1122   CALCIUM 7.2 (L) 10/30/2016 0452   CALCIUM 9.0 10/10/2014 1122   GFRNONAA 5 (L) 10/30/2016 0452   GFRNONAA 6 (L) 10/10/2014 1122   GFRNONAA 6 (L) 06/15/2014 0443   GFRAA 6 (L) 10/30/2016  0452   GFRAA 7 (L) 10/10/2014 1122   GFRAA 7 (L) 06/15/2014 0443   CrCl cannot be calculated (Patient's most recent lab result is older than the maximum 21 days allowed.).  COAG Lab Results  Component Value Date   INR 1.10 05/28/2016   INR 1.03 10/27/2015   INR 0.96 09/26/2015    Radiology Dg Chest Port 1 View  Result Date: 10/29/2016 CLINICAL DATA:  Shortness of breath, difficulty holding still EXAM: PORTABLE CHEST 1 VIEW COMPARISON:  10/06/2016 FINDINGS: There is bilateral interstitial thickening. There is no focal parenchymal opacity. There  is no pleural effusion or pneumothorax. There is stable cardiomegaly. The osseous structures are unremarkable. IMPRESSION: Cardiomegaly with mild pulmonary vascular congestion. Electronically Signed   By: Kathreen Devoid   On: 10/29/2016 09:57    Assessment/Plan 1. ESRD on hemodialysis [facility]) Recommend:  The patient is doing well and currently has adequate dialysis access. The patient's dialysis center is not reporting any access issues. Flow pattern is stable when compared to the prior ultrasound.  The patient should have a duplex ultrasound of the dialysis access in 6 months. The patient will follow-up with me in the office after each ultrasound   2. COPD exacerbation ([place]) Continue aerosol medications as already ordered and reviewed, no changes at this time.  3. Essential hypertension Continue antihypertensive medications as already ordered and reviewed, no changes at this time.   4. Mixed hyperlipidemia Continue Zetia as ordered and reviewed, no changes at this time    Hortencia Pilar, MD  11/22/2016 9:26 AM

## 2016-11-26 ENCOUNTER — Emergency Department: Payer: Medicare Other

## 2016-11-26 ENCOUNTER — Encounter: Payer: Self-pay | Admitting: Emergency Medicine

## 2016-11-26 ENCOUNTER — Emergency Department
Admission: EM | Admit: 2016-11-26 | Discharge: 2016-11-26 | Disposition: A | Discharge status: Home / Self Care | Payer: Medicare Other | Attending: Emergency Medicine | Admitting: Emergency Medicine

## 2016-11-26 DIAGNOSIS — W2203XA Walked into furniture, initial encounter: Secondary | ICD-10-CM | POA: Diagnosis not present

## 2016-11-26 DIAGNOSIS — Z992 Dependence on renal dialysis: Secondary | ICD-10-CM | POA: Insufficient documentation

## 2016-11-26 DIAGNOSIS — Y939 Activity, unspecified: Secondary | ICD-10-CM | POA: Insufficient documentation

## 2016-11-26 DIAGNOSIS — S92912A Unspecified fracture of left toe(s), initial encounter for closed fracture: Secondary | ICD-10-CM

## 2016-11-26 DIAGNOSIS — Z87891 Personal history of nicotine dependence: Secondary | ICD-10-CM | POA: Insufficient documentation

## 2016-11-26 DIAGNOSIS — N186 End stage renal disease: Secondary | ICD-10-CM | POA: Diagnosis not present

## 2016-11-26 DIAGNOSIS — Y999 Unspecified external cause status: Secondary | ICD-10-CM | POA: Diagnosis not present

## 2016-11-26 DIAGNOSIS — Y929 Unspecified place or not applicable: Secondary | ICD-10-CM | POA: Diagnosis not present

## 2016-11-26 DIAGNOSIS — I120 Hypertensive chronic kidney disease with stage 5 chronic kidney disease or end stage renal disease: Secondary | ICD-10-CM | POA: Insufficient documentation

## 2016-11-26 DIAGNOSIS — S92515A Nondisplaced fracture of proximal phalanx of left lesser toe(s), initial encounter for closed fracture: Secondary | ICD-10-CM | POA: Diagnosis not present

## 2016-11-26 DIAGNOSIS — Z79899 Other long term (current) drug therapy: Secondary | ICD-10-CM | POA: Insufficient documentation

## 2016-11-26 DIAGNOSIS — J441 Chronic obstructive pulmonary disease with (acute) exacerbation: Secondary | ICD-10-CM | POA: Insufficient documentation

## 2016-11-26 DIAGNOSIS — S99922A Unspecified injury of left foot, initial encounter: Secondary | ICD-10-CM | POA: Diagnosis present

## 2016-11-26 MED ORDER — ACETAMINOPHEN 325 MG PO TABS
650.0000 mg | ORAL_TABLET | Freq: Once | ORAL | Status: AC
  Administered 2016-11-26: 650 mg via ORAL
  Filled 2016-11-26: qty 2

## 2016-11-26 NOTE — ED Notes (Signed)
4th and 5th toe on left foot buddy taped together per order. Wooden, post op shoe applied to left foot, pt tolerated well.

## 2016-11-26 NOTE — Discharge Instructions (Signed)
Use buddy tape to your toe and wear a wooden shoe for support and for protection. Follow-up with your primary care doctor or Dr. Elvina Mattes who is the podiatrist on-call. Ice and elevate as needed for swelling and pain. Also take Tylenol if needed for pain.

## 2016-11-26 NOTE — ED Provider Notes (Signed)
[facility] Emergency Department Provider Note  ____________________________________________   First MD Initiated Contact with Patient 11/26/16 1008     (approximate)  I have reviewed the triage vital signs and the nursing notes.   HISTORY  Chief Complaint Toe Injury   HPI Michele Nash is a 61 y.o. female is here with complaint of left fifth toe hurting since she hit it on the corner of a coffee table last evening. Patient has continued to walk but states it is been very painful. She denies any previous to fractures. She is not taking any over-the-counter medication as she states she did not have any Tylenol at home. Currently she rates her pain as a 7/10.   Past Medical History:  Diagnosis Date  . Anemia   . Apnea, sleep    for sleep study 10/17/15-no cpap yet  . Arthritis   . Bronchitis   . Chronic kidney disease   . COPD (chronic obstructive pulmonary disease) ([place])   . Depression   . Dialysis patient ([place]) 2014  . GERD (gastroesophageal reflux disease)   . Headache   . Hyperlipidemia   . Hypertension   . Obesity   . Renal dialysis device, implant, or graft complication    RIGHT CHEST CATH    Patient Active Problem List   Diagnosis Date Noted  . COPD exacerbation ([place]) 10/29/2016  . Altered mental status   . Fall   . Traumatic hematoma of forehead   . Acute respiratory failure with hypercapnia ([place]) 10/06/2016  . Pulmonary edema 09/14/2016  . Respiratory failure ([place]) 07/23/2016  . GIB (gastrointestinal bleeding) 07/09/2016  . Protein-calorie malnutrition, severe 05/29/2016  . Hyperglycemia 05/28/2016  . Pain in limb 10/28/2015  . ESRD on dialysis ([place]) 10/28/2015  . Essential hypertension 10/28/2015  . ESRD on hemodialysis ([place]) 03/29/2015  . HTN (hypertension) 03/29/2015  . COPD (chronic obstructive pulmonary disease) ([place]) 03/29/2015  . GAD (generalized anxiety disorder) 03/29/2015    Past Surgical History:  Procedure  Laterality Date  . ABDOMINAL HYSTERECTOMY    . COLONOSCOPY  2011  . COLONOSCOPY WITH PROPOFOL N/A 09/20/2015   Procedure: COLONOSCOPY WITH PROPOFOL;  Surgeon: Lucilla Lame, MD;  Location: [facility] ENDOSCOPY;  Service: Endoscopy;  Laterality: N/A;  . DIALYSIS FISTULA CREATION    . ESOPHAGOGASTRODUODENOSCOPY N/A 07/13/2016   Procedure: ESOPHAGOGASTRODUODENOSCOPY (EGD);  Surgeon: Lollie Sails, MD;  Location: [facility] ENDOSCOPY;  Service: Endoscopy;  Laterality: N/A;  . ESOPHAGOGASTRODUODENOSCOPY (EGD) WITH PROPOFOL N/A 06/02/2016   Procedure: ESOPHAGOGASTRODUODENOSCOPY (EGD) WITH PROPOFOL;  Surgeon: Manya Silvas, MD;  Location: [facility] ENDOSCOPY;  Service: Endoscopy;  Laterality: N/A;  . PERIPHERAL VASCULAR CATHETERIZATION N/A 05/10/2015   Procedure: A/V Shuntogram/Fistulagram;  Surgeon: Katha Cabal, MD;  Location: [place] CV LAB;  Service: Cardiovascular;  Laterality: N/A;  . PERIPHERAL VASCULAR CATHETERIZATION Left 05/10/2015   Procedure: A/V Shunt Intervention;  Surgeon: Katha Cabal, MD;  Location: [place] CV LAB;  Service: Cardiovascular;  Laterality: Left;  . PERIPHERAL VASCULAR CATHETERIZATION Left 08/23/2015   Procedure: A/V Shuntogram/Fistulagram;  Surgeon: Katha Cabal, MD;  Location: [place] CV LAB;  Service: Cardiovascular;  Laterality: Left;  . PERIPHERAL VASCULAR CATHETERIZATION N/A 08/23/2015   Procedure: A/V Shunt Intervention;  Surgeon: Katha Cabal, MD;  Location: [place] CV LAB;  Service: Cardiovascular;  Laterality: N/A;  . PERIPHERAL VASCULAR CATHETERIZATION  08/23/2015   Procedure: Dialysis/Perma Catheter Insertion;  Surgeon: Katha Cabal, MD;  Location: [place] CV LAB;  Service: Cardiovascular;;  .  PERIPHERAL VASCULAR CATHETERIZATION N/A 01/03/2016   Procedure: Dialysis/Perma Catheter Removal;  Surgeon: Katha Cabal, MD;  Location: [place] CV LAB;  Service: Cardiovascular;  Laterality: N/A;  . REVISON OF ARTERIOVENOUS  FISTULA Left 10/28/2015   Procedure: REVISON OF ARTERIOVENOUS FISTULA WITH ARTEGRAFT;  Surgeon: Katha Cabal, MD;  Location: [facility] ORS;  Service: Vascular;  Laterality: Left;  . WOUND DEBRIDEMENT Left 10/28/2015   Procedure: Resection of shoulder cyst ( left );  Surgeon: Katha Cabal, MD;  Location: [facility] ORS;  Service: Vascular;  Laterality: Left;    Prior to Admission medications   Medication Sig Start Date End Date Taking? Authorizing Provider  albuterol (PROVENTIL HFA;VENTOLIN HFA) 108 (90 BASE) MCG/ACT inhaler Inhale 2 puffs into the lungs every 6 (six) hours as needed for wheezing or shortness of breath.    Historical Provider, MD  ALPRAZolam Duanne Moron) 0.5 MG tablet Take 0.5 mg by mouth 2 (two) times daily.    Historical Provider, MD  amLODipine (NORVASC) 10 MG tablet Take 10 mg by mouth daily.     Historical Provider, MD  calcium acetate (PHOSLO) 667 MG capsule Take 1,334 mg by mouth 3 (three) times daily.     Historical Provider, MD  cinacalcet (SENSIPAR) 30 MG tablet Take 60 mg by mouth daily with breakfast.     Historical Provider, MD  ezetimibe (ZETIA) 10 MG tablet Take 10 mg by mouth daily.     Historical Provider, MD  feeding supplement, ENSURE ENLIVE, (ENSURE ENLIVE) LIQD Take 237 mLs by mouth 3 (three) times daily between meals. 10/11/16   [name], MD  fluticasone (FLONASE) 50 MCG/ACT nasal spray Place 2 sprays into both nostrils daily as needed for rhinitis.    Historical Provider, MD  furosemide (LASIX) 40 MG tablet Take 40 mg by mouth every Tuesday, Thursday, Saturday, and Sunday.     Historical Provider, MD  lidocaine-prilocaine (EMLA) cream Apply 1 application topically as needed (prior to accessing port).    Historical Provider, MD  losartan (COZAAR) 100 MG tablet Take 100 mg by mouth daily.     Historical Provider, MD  metoprolol (LOPRESSOR) 100 MG tablet Take 100 mg by mouth 2 (two) times daily.    Historical Provider, MD  multivitamin (RENA-VIT) TABS tablet  Take 1 tablet by mouth at bedtime.     Historical Provider, MD  omeprazole (PRILOSEC) 20 MG capsule Take 20 mg by mouth daily.    Historical Provider, MD  sertraline (ZOLOFT) 50 MG tablet Take 50 mg by mouth daily.     Historical Provider, MD  triamcinolone ointment (KENALOG) 0.1 % Apply 1 application topically 2 (two) times daily.    Historical Provider, MD    Allergies Chantix [varenicline] and Sulfa antibiotics  Family History  Problem Relation Age of Onset  . Heart disease Mother   . Cancer Father   . Cancer Sister     Social History Social History  Substance Use Topics  . Smoking status: Former Smoker    Packs/day: 0.50    Years: 40.00    Types: Cigarettes    Quit date: 10/21/2016  . Smokeless tobacco: Never Used  . Alcohol use No    Review of Systems Constitutional: No fever/chills Cardiovascular: Denies chest pain. Respiratory: Denies shortness of breath. Gastrointestinal:  No nausea, no vomiting.   Musculoskeletal: Positive left fifth toe pain. Skin: Negative for rash. Neurological: Negative for headaches, focal weakness or numbness.  10-point ROS otherwise negative.  ____________________________________________   PHYSICAL EXAM:  VITAL  SIGNS: ED Triage Vitals  Enc Vitals Group     BP 11/26/16 0920 (!) 171/83     Pulse Rate 11/26/16 0920 78     Resp 11/26/16 0920 20     Temp 11/26/16 0920 97.8 F (36.6 C)     Temp Source 11/26/16 0920 Oral     SpO2 11/26/16 0920 99 %     Weight 11/26/16 0921 131 lb (59.4 kg)     Height 11/26/16 0921 5\' 5"  (1.651 m)     Head Circumference --      Peak Flow --      Pain Score 11/26/16 0921 7     Pain Loc --      Pain Edu? --      Excl. in [place]? --     Constitutional: Alert and oriented. Well appearing and in no acute distress. Eyes: Conjunctivae are normal. PERRL. EOMI. Head: Atraumatic. Nose: No congestion/rhinnorhea. Neck: No stridor.   Cardiovascular: Normal rate, regular rhythm. Grossly normal heart sounds.   Good peripheral circulation. Respiratory: Normal respiratory effort.  No retractions. No wheezes or rales was heard. Musculoskeletal: Examination of the left foot there is no gross deformity noted. There is minimal soft tissue swelling present. There is no ecchymosis or abrasions seen. There is some minimal tenderness on palpation of the proximal aspect of the fourth digit however there is marked tenderness on palpation of the fifth digit. Neurologic:  Normal speech and language. No gross focal neurologic deficits are appreciated.  Skin:  Skin is warm, dry and intact. No ecchymosis or abrasions are seen. Psychiatric: Mood and affect are normal. Speech and behavior are normal.  ____________________________________________   LABS (all labs ordered are listed, but only abnormal results are displayed)  Labs Reviewed - No data to display  RADIOLOGY  IMPRESSION:  1. No fracture or dislocation identified in the left first ray.  2. Questionable nondisplaced fracture at the base of the fourth  proximal phalanx; if there is not point tenderness at that site then  consider this finding artifact.  3. No other acute fracture or dislocation identified in the left  foot.   I, Johnn Hai, personally viewed and evaluated these images (plain radiographs) as part of my medical decision making, as well as reviewing the written report by the radiologist. ____________________________________________   PROCEDURES  Procedure(s) performed: None  Procedures  Critical Care performed: No  ____________________________________________   INITIAL IMPRESSION / ASSESSMENT AND PLAN / ED COURSE  Pertinent labs & imaging results that were available during my care of the patient were reviewed by me and considered in my medical decision making (see chart for details).    Clinical Course    Patient Was placed to the fourth and fifth digits. Patient was placed in a wooden shoe. She is to follow-up with  Dr. Elvina Mattes in the podiatry department. She was given information for telephone number and location. Patient is also to take Tylenol if needed for pain and elevate and ice as needed for swelling and pain control.  ____________________________________________   FINAL CLINICAL IMPRESSION(S) / ED DIAGNOSES  Final diagnoses:  Closed nondisplaced fracture of phalanx of toe of left foot, unspecified toe, initial encounter      NEW MEDICATIONS STARTED DURING THIS VISIT:  Discharge Medication List as of 11/26/2016 10:50 AM       Note:  This document was prepared using Dragon voice recognition software and may include unintentional dictation errors.    Johnn Hai, PA-C 11/26/16  [address]    Johnn Hai, PA-C 11/26/16 [place], MD 11/26/16 1535

## 2016-11-26 NOTE — ED Triage Notes (Signed)
Reports hitting left pinky toe on a coffee table corner last pm. C/o pain.

## 2016-11-28 DIAGNOSIS — E785 Hyperlipidemia, unspecified: Secondary | ICD-10-CM | POA: Insufficient documentation

## 2016-12-04 ENCOUNTER — Inpatient Hospital Stay: Payer: Medicare Other

## 2016-12-04 ENCOUNTER — Emergency Department: Payer: Medicare Other

## 2016-12-04 ENCOUNTER — Encounter: Payer: Self-pay | Admitting: *Deleted

## 2016-12-04 ENCOUNTER — Inpatient Hospital Stay
Admission: EM | Admit: 2016-12-04 | Discharge: 2016-12-11 | DRG: 183 | Disposition: A | Discharge status: Home / Self Care | Payer: Medicare Other | Attending: Internal Medicine | Admitting: Internal Medicine

## 2016-12-04 DIAGNOSIS — N186 End stage renal disease: Secondary | ICD-10-CM | POA: Diagnosis present

## 2016-12-04 DIAGNOSIS — S2241XA Multiple fractures of ribs, right side, initial encounter for closed fracture: Secondary | ICD-10-CM | POA: Diagnosis present

## 2016-12-04 DIAGNOSIS — J9600 Acute respiratory failure, unspecified whether with hypoxia or hypercapnia: Secondary | ICD-10-CM

## 2016-12-04 DIAGNOSIS — I132 Hypertensive heart and chronic kidney disease with heart failure and with stage 5 chronic kidney disease, or end stage renal disease: Secondary | ICD-10-CM | POA: Diagnosis present

## 2016-12-04 DIAGNOSIS — Z9181 History of falling: Secondary | ICD-10-CM

## 2016-12-04 DIAGNOSIS — G4730 Sleep apnea, unspecified: Secondary | ICD-10-CM | POA: Diagnosis present

## 2016-12-04 DIAGNOSIS — M1990 Unspecified osteoarthritis, unspecified site: Secondary | ICD-10-CM | POA: Diagnosis present

## 2016-12-04 DIAGNOSIS — Y92009 Unspecified place in unspecified non-institutional (private) residence as the place of occurrence of the external cause: Secondary | ICD-10-CM | POA: Diagnosis not present

## 2016-12-04 DIAGNOSIS — I10 Essential (primary) hypertension: Secondary | ICD-10-CM

## 2016-12-04 DIAGNOSIS — Z79899 Other long term (current) drug therapy: Secondary | ICD-10-CM

## 2016-12-04 DIAGNOSIS — R0902 Hypoxemia: Secondary | ICD-10-CM

## 2016-12-04 DIAGNOSIS — J449 Chronic obstructive pulmonary disease, unspecified: Secondary | ICD-10-CM | POA: Diagnosis present

## 2016-12-04 DIAGNOSIS — S27321A Contusion of lung, unilateral, initial encounter: Secondary | ICD-10-CM | POA: Diagnosis present

## 2016-12-04 DIAGNOSIS — G9340 Encephalopathy, unspecified: Secondary | ICD-10-CM | POA: Diagnosis present

## 2016-12-04 DIAGNOSIS — J441 Chronic obstructive pulmonary disease with (acute) exacerbation: Secondary | ICD-10-CM | POA: Diagnosis not present

## 2016-12-04 DIAGNOSIS — Z992 Dependence on renal dialysis: Secondary | ICD-10-CM | POA: Diagnosis not present

## 2016-12-04 DIAGNOSIS — J9621 Acute and chronic respiratory failure with hypoxia: Secondary | ICD-10-CM | POA: Diagnosis present

## 2016-12-04 DIAGNOSIS — F329 Major depressive disorder, single episode, unspecified: Secondary | ICD-10-CM | POA: Diagnosis present

## 2016-12-04 DIAGNOSIS — F17210 Nicotine dependence, cigarettes, uncomplicated: Secondary | ICD-10-CM | POA: Diagnosis present

## 2016-12-04 DIAGNOSIS — I5030 Unspecified diastolic (congestive) heart failure: Secondary | ICD-10-CM | POA: Diagnosis present

## 2016-12-04 DIAGNOSIS — E875 Hyperkalemia: Secondary | ICD-10-CM | POA: Diagnosis present

## 2016-12-04 DIAGNOSIS — S270XXA Traumatic pneumothorax, initial encounter: Secondary | ICD-10-CM | POA: Diagnosis present

## 2016-12-04 DIAGNOSIS — S2239XA Fracture of one rib, unspecified side, initial encounter for closed fracture: Secondary | ICD-10-CM

## 2016-12-04 DIAGNOSIS — J9620 Acute and chronic respiratory failure, unspecified whether with hypoxia or hypercapnia: Secondary | ICD-10-CM | POA: Diagnosis present

## 2016-12-04 DIAGNOSIS — J9622 Acute and chronic respiratory failure with hypercapnia: Secondary | ICD-10-CM | POA: Diagnosis present

## 2016-12-04 DIAGNOSIS — E785 Hyperlipidemia, unspecified: Secondary | ICD-10-CM | POA: Diagnosis present

## 2016-12-04 DIAGNOSIS — D631 Anemia in chronic kidney disease: Secondary | ICD-10-CM | POA: Diagnosis present

## 2016-12-04 DIAGNOSIS — K219 Gastro-esophageal reflux disease without esophagitis: Secondary | ICD-10-CM | POA: Diagnosis present

## 2016-12-04 DIAGNOSIS — R296 Repeated falls: Secondary | ICD-10-CM | POA: Diagnosis present

## 2016-12-04 DIAGNOSIS — W010XXA Fall on same level from slipping, tripping and stumbling without subsequent striking against object, initial encounter: Secondary | ICD-10-CM | POA: Diagnosis present

## 2016-12-04 DIAGNOSIS — R079 Chest pain, unspecified: Secondary | ICD-10-CM

## 2016-12-04 DIAGNOSIS — N2581 Secondary hyperparathyroidism of renal origin: Secondary | ICD-10-CM | POA: Diagnosis present

## 2016-12-04 DIAGNOSIS — Z9981 Dependence on supplemental oxygen: Secondary | ICD-10-CM

## 2016-12-04 LAB — URINALYSIS, COMPLETE (UACMP) WITH MICROSCOPIC
BILIRUBIN URINE: NEGATIVE
KETONES UR: NEGATIVE mg/dL
Nitrite: NEGATIVE
Specific Gravity, Urine: 1.007 (ref 1.005–1.030)
pH: 8 (ref 5.0–8.0)

## 2016-12-04 LAB — BLOOD GAS, ARTERIAL
ACID-BASE DEFICIT: 0.4 mmol/L (ref 0.0–2.0)
BICARBONATE: 28.3 mmol/L — AB (ref 20.0–28.0)
Delivery systems: POSITIVE
EXPIRATORY PAP: 6
FIO2: 0.35
Inspiratory PAP: 14
O2 SAT: 97.7 %
PATIENT TEMPERATURE: 37
PH ART: 7.26 — AB (ref 7.350–7.450)
PO2 ART: 112 mmHg — AB (ref 83.0–108.0)
pCO2 arterial: 63 mmHg — ABNORMAL HIGH (ref 32.0–48.0)

## 2016-12-04 LAB — COMPREHENSIVE METABOLIC PANEL
ALK PHOS: 222 U/L — AB (ref 38–126)
ALT: 22 U/L (ref 14–54)
ANION GAP: 10 (ref 5–15)
AST: 31 U/L (ref 15–41)
Albumin: 3.9 g/dL (ref 3.5–5.0)
BUN: 32 mg/dL — ABNORMAL HIGH (ref 6–20)
CALCIUM: 8.7 mg/dL — AB (ref 8.9–10.3)
CO2: 32 mmol/L (ref 22–32)
Chloride: 98 mmol/L — ABNORMAL LOW (ref 101–111)
Creatinine, Ser: 6.67 mg/dL — ABNORMAL HIGH (ref 0.44–1.00)
GFR calc non Af Amer: 6 mL/min — ABNORMAL LOW (ref >60)
GFR, EST AFRICAN AMERICAN: 7 mL/min — AB (ref >60)
Glucose, Bld: 112 mg/dL — ABNORMAL HIGH (ref 65–99)
Potassium: 5.4 mmol/L — ABNORMAL HIGH (ref 3.5–5.1)
SODIUM: 140 mmol/L (ref 135–145)
Total Bilirubin: 0.6 mg/dL (ref 0.3–1.2)
Total Protein: 7.9 g/dL (ref 6.5–8.1)

## 2016-12-04 LAB — CBC WITH DIFFERENTIAL/PLATELET
BASOS ABS: 0.1 10*3/uL (ref 0–0.1)
BASOS PCT: 1 %
EOS ABS: 0.1 10*3/uL (ref 0–0.7)
EOS PCT: 2 %
HCT: 43.1 % (ref 35.0–47.0)
Hemoglobin: 14.2 g/dL (ref 12.0–16.0)
Lymphocytes Relative: 12 %
Lymphs Abs: 1 10*3/uL (ref 1.0–3.6)
MCH: 31.5 pg (ref 26.0–34.0)
MCHC: 33.1 g/dL (ref 32.0–36.0)
MCV: 95.2 fL (ref 80.0–100.0)
MONO ABS: 1.2 10*3/uL — AB (ref 0.2–0.9)
Monocytes Relative: 14 %
Neutro Abs: 6 10*3/uL (ref 1.4–6.5)
Neutrophils Relative %: 71 %
PLATELETS: 298 10*3/uL (ref 150–440)
RBC: 4.52 MIL/uL (ref 3.80–5.20)
RDW: 19.1 % — AB (ref 11.5–14.5)
WBC: 8.4 10*3/uL (ref 3.6–11.0)

## 2016-12-04 LAB — BLOOD GAS, VENOUS
PCO2 VEN: 90 mmHg — AB (ref 44.0–60.0)
PH VEN: 7.15 — AB (ref 7.250–7.430)
Patient temperature: 37
pO2, Ven: 37 mmHg (ref 32.0–45.0)

## 2016-12-04 LAB — PROTIME-INR
INR: 1.06
Prothrombin Time: 13.8 seconds (ref 11.4–15.2)

## 2016-12-04 LAB — LACTIC ACID, PLASMA: LACTIC ACID, VENOUS: 1.6 mmol/L (ref 0.5–1.9)

## 2016-12-04 LAB — GLUCOSE, CAPILLARY: Glucose-Capillary: 117 mg/dL — ABNORMAL HIGH (ref 65–99)

## 2016-12-04 MED ORDER — SERTRALINE HCL 50 MG PO TABS
50.0000 mg | ORAL_TABLET | Freq: Every day | ORAL | Status: DC
  Administered 2016-12-05 – 2016-12-11 (×7): 50 mg via ORAL
  Filled 2016-12-04 (×7): qty 1

## 2016-12-04 MED ORDER — FUROSEMIDE 40 MG PO TABS
40.0000 mg | ORAL_TABLET | ORAL | Status: DC
  Administered 2016-12-06 – 2016-12-11 (×4): 40 mg via ORAL
  Filled 2016-12-04 (×4): qty 1

## 2016-12-04 MED ORDER — CEFTRIAXONE SODIUM-DEXTROSE 1-3.74 GM-% IV SOLR
1.0000 g | Freq: Once | INTRAVENOUS | Status: AC
  Administered 2016-12-04: 1 g via INTRAVENOUS
  Filled 2016-12-04: qty 50

## 2016-12-04 MED ORDER — FLUTICASONE PROPIONATE 50 MCG/ACT NA SUSP
2.0000 | Freq: Every day | NASAL | Status: DC | PRN
  Administered 2016-12-10: 2 via NASAL
  Filled 2016-12-04 (×2): qty 16

## 2016-12-04 MED ORDER — AMLODIPINE BESYLATE 5 MG PO TABS
10.0000 mg | ORAL_TABLET | Freq: Once | ORAL | Status: AC
  Administered 2016-12-04: 10 mg via ORAL
  Filled 2016-12-04: qty 2

## 2016-12-04 MED ORDER — PANTOPRAZOLE SODIUM 40 MG PO TBEC
40.0000 mg | DELAYED_RELEASE_TABLET | Freq: Two times a day (BID) | ORAL | Status: DC
  Administered 2016-12-04 – 2016-12-07 (×6): 40 mg via ORAL
  Filled 2016-12-04 (×6): qty 1

## 2016-12-04 MED ORDER — CHLORHEXIDINE GLUCONATE 0.12 % MT SOLN
15.0000 mL | Freq: Two times a day (BID) | OROMUCOSAL | Status: DC
  Administered 2016-12-05 – 2016-12-11 (×9): 15 mL via OROMUCOSAL
  Filled 2016-12-04 (×6): qty 15

## 2016-12-04 MED ORDER — CINACALCET HCL 30 MG PO TABS
60.0000 mg | ORAL_TABLET | Freq: Every day | ORAL | Status: DC
  Administered 2016-12-05 – 2016-12-11 (×6): 60 mg via ORAL
  Filled 2016-12-04 (×8): qty 2

## 2016-12-04 MED ORDER — LOSARTAN POTASSIUM 50 MG PO TABS
100.0000 mg | ORAL_TABLET | Freq: Every day | ORAL | Status: DC
  Administered 2016-12-05 – 2016-12-10 (×5): 100 mg via ORAL
  Filled 2016-12-04 (×2): qty 2
  Filled 2016-12-04: qty 4
  Filled 2016-12-04 (×2): qty 2
  Filled 2016-12-04 (×2): qty 4

## 2016-12-04 MED ORDER — ENSURE ENLIVE PO LIQD
237.0000 mL | Freq: Three times a day (TID) | ORAL | Status: DC
  Administered 2016-12-04 – 2016-12-10 (×12): 237 mL via ORAL

## 2016-12-04 MED ORDER — ONDANSETRON HCL 4 MG PO TABS: 4.0000 mg | ORAL_TABLET | Freq: Four times a day (QID) | ORAL | Status: DC | PRN

## 2016-12-04 MED ORDER — HYDRALAZINE HCL 20 MG/ML IJ SOLN: 10.0000 mg | Freq: Four times a day (QID) | INTRAMUSCULAR | Status: DC | PRN

## 2016-12-04 MED ORDER — OXYCODONE-ACETAMINOPHEN 5-325 MG PO TABS
1.0000 | ORAL_TABLET | Freq: Once | ORAL | Status: AC
  Administered 2016-12-04: 1 via ORAL

## 2016-12-04 MED ORDER — IPRATROPIUM-ALBUTEROL 0.5-2.5 (3) MG/3ML IN SOLN
3.0000 mL | RESPIRATORY_TRACT | Status: DC
  Administered 2016-12-04 – 2016-12-07 (×17): 3 mL via RESPIRATORY_TRACT
  Filled 2016-12-04 (×17): qty 3

## 2016-12-04 MED ORDER — LOSARTAN POTASSIUM 50 MG PO TABS
100.0000 mg | ORAL_TABLET | Freq: Once | ORAL | Status: AC
  Administered 2016-12-04: 100 mg via ORAL
  Filled 2016-12-04: qty 2

## 2016-12-04 MED ORDER — ACETAMINOPHEN 650 MG RE SUPP: 650.0000 mg | Freq: Four times a day (QID) | RECTAL | Status: DC | PRN

## 2016-12-04 MED ORDER — SENNOSIDES-DOCUSATE SODIUM 8.6-50 MG PO TABS: 1.0000 | ORAL_TABLET | Freq: Every evening | ORAL | Status: DC | PRN

## 2016-12-04 MED ORDER — HEPARIN SODIUM (PORCINE) 5000 UNIT/ML IJ SOLN
5000.0000 [IU] | Freq: Three times a day (TID) | INTRAMUSCULAR | Status: DC
  Administered 2016-12-04: 5000 [IU] via SUBCUTANEOUS
  Filled 2016-12-04 (×10): qty 1

## 2016-12-04 MED ORDER — OXYCODONE-ACETAMINOPHEN 5-325 MG PO TABS
ORAL_TABLET | ORAL | Status: AC
  Administered 2016-12-04: 1 via ORAL
  Filled 2016-12-04: qty 1

## 2016-12-04 MED ORDER — KETOROLAC TROMETHAMINE 15 MG/ML IJ SOLN
15.0000 mg | Freq: Four times a day (QID) | INTRAMUSCULAR | Status: DC | PRN
  Administered 2016-12-04 – 2016-12-05 (×2): 15 mg via INTRAVENOUS
  Filled 2016-12-04 (×2): qty 1

## 2016-12-04 MED ORDER — ALPRAZOLAM 0.5 MG PO TABS
0.5000 mg | ORAL_TABLET | Freq: Two times a day (BID) | ORAL | Status: DC | PRN
  Administered 2016-12-04 – 2016-12-10 (×2): 0.5 mg via ORAL
  Filled 2016-12-04 (×2): qty 1

## 2016-12-04 MED ORDER — CEFTRIAXONE SODIUM-DEXTROSE 1-3.74 GM-% IV SOLR
1.0000 g | INTRAVENOUS | Status: DC
  Administered 2016-12-05 – 2016-12-11 (×6): 1 g via INTRAVENOUS
  Filled 2016-12-04 (×7): qty 50

## 2016-12-04 MED ORDER — TRAMADOL HCL 50 MG PO TABS
50.0000 mg | ORAL_TABLET | Freq: Two times a day (BID) | ORAL | Status: DC | PRN
  Administered 2016-12-04 – 2016-12-11 (×8): 50 mg via ORAL
  Filled 2016-12-04 (×10): qty 1

## 2016-12-04 MED ORDER — BISACODYL 5 MG PO TBEC: 5.0000 mg | DELAYED_RELEASE_TABLET | Freq: Every day | ORAL | Status: DC | PRN

## 2016-12-04 MED ORDER — DEXTROSE 5 % IV SOLN: 1.0000 g | Freq: Once | INTRAVENOUS | Status: DC

## 2016-12-04 MED ORDER — AMLODIPINE BESYLATE 10 MG PO TABS
10.0000 mg | ORAL_TABLET | Freq: Every day | ORAL | Status: DC
  Administered 2016-12-05 – 2016-12-11 (×6): 10 mg via ORAL
  Filled 2016-12-04 (×6): qty 1

## 2016-12-04 MED ORDER — LIDOCAINE-PRILOCAINE 2.5-2.5 % EX CREA
1.0000 "application " | TOPICAL_CREAM | CUTANEOUS | Status: DC | PRN
  Filled 2016-12-04: qty 5

## 2016-12-04 MED ORDER — CARVEDILOL 12.5 MG PO TABS
12.5000 mg | ORAL_TABLET | Freq: Two times a day (BID) | ORAL | Status: DC
  Administered 2016-12-05 – 2016-12-11 (×11): 12.5 mg via ORAL
  Filled 2016-12-04: qty 1
  Filled 2016-12-04 (×3): qty 2
  Filled 2016-12-04: qty 1
  Filled 2016-12-04: qty 2
  Filled 2016-12-04 (×2): qty 1
  Filled 2016-12-04 (×3): qty 2
  Filled 2016-12-04: qty 1

## 2016-12-04 MED ORDER — CALCIUM ACETATE (PHOS BINDER) 667 MG PO CAPS
1334.0000 mg | ORAL_CAPSULE | Freq: Three times a day (TID) | ORAL | Status: DC
  Administered 2016-12-05 – 2016-12-11 (×16): 1334 mg via ORAL
  Filled 2016-12-04 (×22): qty 2

## 2016-12-04 MED ORDER — ORAL CARE MOUTH RINSE
15.0000 mL | Freq: Two times a day (BID) | OROMUCOSAL | Status: DC
  Administered 2016-12-06 – 2016-12-10 (×3): 15 mL via OROMUCOSAL

## 2016-12-04 MED ORDER — ONDANSETRON HCL 4 MG/2ML IJ SOLN
4.0000 mg | Freq: Four times a day (QID) | INTRAMUSCULAR | Status: DC | PRN
  Administered 2016-12-05: 4 mg via INTRAVENOUS
  Filled 2016-12-04: qty 2

## 2016-12-04 MED ORDER — METOPROLOL TARTRATE 50 MG PO TABS
100.0000 mg | ORAL_TABLET | Freq: Once | ORAL | Status: AC
  Administered 2016-12-04: 100 mg via ORAL
  Filled 2016-12-04: qty 2

## 2016-12-04 MED ORDER — ACETAMINOPHEN 325 MG PO TABS
650.0000 mg | ORAL_TABLET | Freq: Four times a day (QID) | ORAL | Status: DC | PRN
  Administered 2016-12-06 (×2): 650 mg via ORAL
  Filled 2016-12-04 (×2): qty 2

## 2016-12-04 NOTE — Progress Notes (Signed)
Pt. Was transported to CT for a chest CT while on the bipap.

## 2016-12-04 NOTE — Progress Notes (Signed)
Pt awoke around 1850 very agitated-pulled off bipap, raised voice and irritable to any questions. Pt request to eat. Pt is currently sating 95% on 3LNC. Husband is at bedside. Report given to [facility].

## 2016-12-04 NOTE — Progress Notes (Addendum)
Pt arrived alert and oriented x 4 with c/o aching pain 4/10 to mid, right rib cage. Pt due for CT chest to assess for fractures. NSR with ST elevation on cardiac monitor. Lung sounds with bilat upper and lower crackles. Pt arrived on 3LNC. RR even and unlabored. No cough. ABG showed respiratory acidosis. Will monitor pt closely.

## 2016-12-04 NOTE — Consult Note (Signed)
Surgical Consultation  12/04/2016  Michele Nash is an 61 y.o. female.   CC: Right rib fractures  HPI: This patient the ICU with multiple rib fractures posteriorly on the right after a fall. Patient describes right chest pain and shortness of breath  Past Medical History:  Diagnosis Date  . Anemia   . Apnea, sleep    for sleep study 10/17/15-no cpap yet  . Arthritis   . Bronchitis   . Chronic kidney disease   . COPD (chronic obstructive pulmonary disease) ([place])   . Depression   . Dialysis patient ([place]) 2014  . GERD (gastroesophageal reflux disease)   . Headache   . Hyperlipidemia   . Hypertension   . Obesity   . Renal dialysis device, implant, or graft complication    RIGHT CHEST CATH    Past Surgical History:  Procedure Laterality Date  . ABDOMINAL HYSTERECTOMY    . COLONOSCOPY  2011  . COLONOSCOPY WITH PROPOFOL N/A 09/20/2015   Procedure: COLONOSCOPY WITH PROPOFOL;  Surgeon: Lucilla Lame, MD;  Location: [facility] ENDOSCOPY;  Service: Endoscopy;  Laterality: N/A;  . DIALYSIS FISTULA CREATION    . ESOPHAGOGASTRODUODENOSCOPY N/A 07/13/2016   Procedure: ESOPHAGOGASTRODUODENOSCOPY (EGD);  Surgeon: Lollie Sails, MD;  Location[facility] ENDOSCOPY;  Service: Endoscopy;  Laterality: N/A;  . ESOPHAGOGASTRODUODENOSCOPY (EGD) WITH PROPOFOL N/A 06/02/2016   Procedure: ESOPHAGOGASTRODUODENOSCOPY (EGD) WITH PROPOFOL;  Surgeon: Manya Silvas, MD;  Location: [facility] ENDOSCOPY;  Service: Endoscopy;  Laterality: N/A;  . PERIPHERAL VASCULAR CATHETERIZATION N/A 05/10/2015   Procedure: A/V Shuntogram/Fistulagram;  Surgeon: Katha Cabal, MD;  Location: [place] CV LAB;  Service: Cardiovascular;  Laterality: N/A;  . PERIPHERAL VASCULAR CATHETERIZATION Left 05/10/2015   Procedure: A/V Shunt Intervention;  Surgeon: Katha Cabal, MD;  Location: [place] CV LAB;  Service: Cardiovascular;  Laterality: Left;  . PERIPHERAL VASCULAR CATHETERIZATION Left 08/23/2015   Procedure: A/V  Shuntogram/Fistulagram;  Surgeon: Katha Cabal, MD;  Location: [place] CV LAB;  Service: Cardiovascular;  Laterality: Left;  . PERIPHERAL VASCULAR CATHETERIZATION N/A 08/23/2015   Procedure: A/V Shunt Intervention;  Surgeon: Katha Cabal, MD;  Location: [place] CV LAB;  Service: Cardiovascular;  Laterality: N/A;  . PERIPHERAL VASCULAR CATHETERIZATION  08/23/2015   Procedure: Dialysis/Perma Catheter Insertion;  Surgeon: Katha Cabal, MD;  Location: [place] CV LAB;  Service: Cardiovascular;;  . PERIPHERAL VASCULAR CATHETERIZATION N/A 01/03/2016   Procedure: Dialysis/Perma Catheter Removal;  Surgeon: Katha Cabal, MD;  Location: [place] CV LAB;  Service: Cardiovascular;  Laterality: N/A;  . REVISON OF ARTERIOVENOUS FISTULA Left 10/28/2015   Procedure: REVISON OF ARTERIOVENOUS FISTULA WITH ARTEGRAFT;  Surgeon: Katha Cabal, MD;  Location: [facility] ORS;  Service: Vascular;  Laterality: Left;  . WOUND DEBRIDEMENT Left 10/28/2015   Procedure: Resection of shoulder cyst ( left );  Surgeon: Katha Cabal, MD;  Location: [facility] ORS;  Service: Vascular;  Laterality: Left;    Family History  Problem Relation Age of Onset  . Heart disease Mother   . Cancer Father   . Cancer Sister     Social History:  reports that she has been smoking Cigarettes.  She has a 20.00 pack-year smoking history. She has never used smokeless tobacco. She reports that she does not drink alcohol or use drugs.  Allergies:  Allergies  Allergen Reactions  . Chantix [Varenicline]     hallucinations  . Sulfa Antibiotics Itching, Swelling, Rash and Other (See Comments)    Reaction:  Facial/body swelling  Medications reviewed.   Review of Systems:   Review of Systems  Constitutional: Negative for chills and fever.  HENT: Negative.   Respiratory: Positive for shortness of breath.   Cardiovascular: Positive for chest pain.  Skin: Negative.      Physical Exam:  BP 134/74    Pulse 71   Temp 98.6 F (37 C) (Oral)   Resp (!) 21   Ht '5\' 5"'$  (1.651 m)   Wt 123 lb 7.3 oz (56 kg)   SpO2 93%   BMI 20.54 kg/m   Physical Exam  Constitutional: She is oriented to person, place, and time and well-developed, well-nourished, and in no distress. No distress.  Patient sitting up very comfortable.  HENT:  Head: Normocephalic and atraumatic.  Cardiovascular: Normal rate, regular rhythm and normal heart sounds.   Pulmonary/Chest: Effort normal and breath sounds normal. No respiratory distress. She has no wheezes. She has no rales. She exhibits tenderness.  Right posterior chest wall tenderness no crepitance  Neurological: She is alert and oriented to person, place, and time.  Skin: Skin is warm and dry. She is not diaphoretic.  Vitals reviewed.     Results for orders placed or performed during the hospital encounter of 12/04/16 (from the past 48 hour(s))  Lactic acid, plasma     Status: None   Collection Time: 12/04/16  3:10 AM  Result Value Ref Range   Lactic Acid, Venous 1.6 0.5 - 1.9 mmol/L  CBC with Differential/Platelet     Status: Abnormal   Collection Time: 12/04/16  3:10 AM  Result Value Ref Range   WBC 8.4 3.6 - 11.0 K/uL   RBC 4.52 3.80 - 5.20 MIL/uL   Hemoglobin 14.2 12.0 - 16.0 g/dL   HCT 43.1 35.0 - 47.0 %   MCV 95.2 80.0 - 100.0 fL   MCH 31.5 26.0 - 34.0 pg   MCHC 33.1 32.0 - 36.0 g/dL   RDW 19.1 (H) 11.5 - 14.5 %   Platelets 298 150 - 440 K/uL   Neutrophils Relative % 71 %   Neutro Abs 6.0 1.4 - 6.5 K/uL   Lymphocytes Relative 12 %   Lymphs Abs 1.0 1.0 - 3.6 K/uL   Monocytes Relative 14 %   Monocytes Absolute 1.2 (H) 0.2 - 0.9 K/uL   Eosinophils Relative 2 %   Eosinophils Absolute 0.1 0 - 0.7 K/uL   Basophils Relative 1 %   Basophils Absolute 0.1 0 - 0.1 K/uL  Culture, blood (Routine x 2)     Status: None (Preliminary result)   Collection Time: 12/04/16  3:11 AM  Result Value Ref Range   Specimen Description BLOOD R WRIST    Special  Requests      BOTTLES DRAWN AEROBIC AND ANAEROBIC AER 5ML ANA 2ML   Culture NO GROWTH < 12 HOURS    Report Status PENDING   Culture, blood (Routine x 2)     Status: None (Preliminary result)   Collection Time: 12/04/16  3:37 AM  Result Value Ref Range   Specimen Description BLOOD R FA    Special Requests      BOTTLES DRAWN AEROBIC AND ANAEROBIC AER 8ML ANA 6ML   Culture NO GROWTH < 12 HOURS    Report Status PENDING   Protime-INR     Status: None   Collection Time: 12/04/16  3:37 AM  Result Value Ref Range   Prothrombin Time 13.8 11.4 - 15.2 seconds   INR 1.06   Comprehensive metabolic panel  Status: Abnormal   Collection Time: 12/04/16  3:54 AM  Result Value Ref Range   Sodium 140 135 - 145 mmol/L   Potassium 5.4 (H) 3.5 - 5.1 mmol/L   Chloride 98 (L) 101 - 111 mmol/L   CO2 32 22 - 32 mmol/L   Glucose, Bld 112 (H) 65 - 99 mg/dL   BUN 32 (H) 6 - 20 mg/dL   Creatinine, Ser 6.67 (H) 0.44 - 1.00 mg/dL   Calcium 8.7 (L) 8.9 - 10.3 mg/dL   Total Protein 7.9 6.5 - 8.1 g/dL   Albumin 3.9 3.5 - 5.0 g/dL   AST 31 15 - 41 U/L   ALT 22 14 - 54 U/L   Alkaline Phosphatase 222 (H) 38 - 126 U/L   Total Bilirubin 0.6 0.3 - 1.2 mg/dL   GFR calc non Af Amer 6 (L) >60 mL/min   GFR calc Af Amer 7 (L) >60 mL/min    Comment: (NOTE) The eGFR has been calculated using the CKD EPI equation. This calculation has not been validated in all clinical situations. eGFR's persistently <60 mL/min signify possible Chronic Kidney Disease.    Anion gap 10 5 - 15  Urinalysis, Complete w Microscopic     Status: Abnormal   Collection Time: 12/04/16  4:46 AM  Result Value Ref Range   Color, Urine YELLOW (A) YELLOW   APPearance HAZY (A) CLEAR   Specific Gravity, Urine 1.007 1.005 - 1.030   pH 8.0 5.0 - 8.0   Glucose, UA >=500 (A) NEGATIVE mg/dL   Hgb urine dipstick SMALL (A) NEGATIVE   Bilirubin Urine NEGATIVE NEGATIVE   Ketones, ur NEGATIVE NEGATIVE mg/dL   Protein, ur >=300 (A) NEGATIVE mg/dL    Nitrite NEGATIVE NEGATIVE   Leukocytes, UA MODERATE (A) NEGATIVE   RBC / HPF 6-30 0 - 5 RBC/hpf   WBC, UA TOO NUMEROUS TO COUNT 0 - 5 WBC/hpf   Bacteria, UA RARE (A) NONE SEEN   Squamous Epithelial / LPF 6-30 (A) NONE SEEN  Blood gas, venous     Status: Abnormal   Collection Time: 12/04/16  9:36 AM  Result Value Ref Range   pH, Ven 7.15 (LL) 7.250 - 7.430    Comment: CRITICAL RESULT CALLED TO, READ BACK BY AND VERIFIED WITH: STEPHANIE RN [id number] JGT    pCO2, Ven 90 (HH) 44.0 - 60.0 mmHg    Comment: CRITICAL RESULT CALLED TO, READ BACK BY AND VERIFIED WITH: STEPHANI [id number] JGT    pO2, Ven 37.0 32.0 - 45.0 mmHg   Patient temperature 37.0    Collection site VENOUS    Sample type VENOUS   Glucose, capillary     Status: Abnormal   Collection Time: 12/04/16 12:41 PM  Result Value Ref Range   Glucose-Capillary 117 (H) 65 - 99 mg/dL  Blood gas, arterial     Status: Abnormal   Collection Time: 12/04/16  4:11 PM  Result Value Ref Range   FIO2 0.35    Delivery systems BILEVEL POSITIVE AIRWAY PRESSURE    Inspiratory PAP 14    Expiratory PAP 6    pH, Arterial 7.26 (L) 7.350 - 7.450   pCO2 arterial 63 (H) 32.0 - 48.0 mmHg   pO2, Arterial 112 (H) 83.0 - 108.0 mmHg   Bicarbonate 28.3 (H) 20.0 - 28.0 mmol/L   Acid-base deficit 0.4 0.0 - 2.0 mmol/L   O2 Saturation 97.7 %   Patient temperature 37.0    Collection site RIGHT BRACHIAL  Sample type ARTERIAL DRAW    Allens test (pass/fail) PASS PASS   Dg Chest 2 View  Result Date: 12/04/2016 CLINICAL DATA:  Patient fell tonight. Right rib pain. Bruising to the right lateral and posterior ribs. History of hypertension. EXAM: CHEST  2 VIEW COMPARISON:  10/29/2016 FINDINGS: Shallow inspiration. Cardiac enlargement without vascular congestion. Focal infiltration in the right lung base posteriorly could represent atelectasis, contusion, or pneumonia. This is new since previous study. Displaced right posterior rib fractures seen best on the  lateral view. No blunting of costophrenic angles. No pneumothorax. Calcified and tortuous aorta. IMPRESSION: Displaced right posterolateral rib fractures. Infiltration or atelectasis in the right lung base posteriorly. Cardiac enlargement. Electronically Signed   By: Lucienne Capers M.D.   On: 12/04/2016 03:13   Ct Head Wo Contrast  Result Date: 12/04/2016 CLINICAL DATA:  Confusion EXAM: CT HEAD WITHOUT CONTRAST TECHNIQUE: Contiguous axial images were obtained from the base of the skull through the vertex without intravenous contrast. COMPARISON:  10/16/2016 FINDINGS: Brain: No evidence of acute infarction, hemorrhage, hydrocephalus, extra-axial collection or mass lesion/mass effect. Vascular: No hyperdense vessel or unexpected calcification. Skull: Normal. Negative for fracture or focal lesion. Sinuses/Orbits: No acute finding. Other: None. IMPRESSION: No acute abnormality noted. Electronically Signed   By: Inez Catalina M.D.   On: 12/04/2016 09:05   Ct Chest Wo Contrast  Result Date: 12/04/2016 CLINICAL DATA:  Fall with right rib fractures. Right rib pain. Initial encounter. EXAM: CT CHEST WITHOUT CONTRAST TECHNIQUE: Multidetector CT imaging of the chest was performed following the standard protocol without IV contrast. COMPARISON:  Chest radiographs 12/04/2016 and CT 01/06/2015 FINDINGS: The study is mildly degraded by motion artifact. Cardiovascular: Thoracic aortic and three-vessel coronary artery atherosclerosis. Mild ectasia of the ascending aorta which measures up to 3.8 cm in diameter, stable to slightly increased from prior. Cardiomegaly. No pericardial effusion. Mediastinum/Nodes: No enlarged axillary, mediastinal, or hilar lymph nodes identified on this unenhanced study. Calcified thyroid nodules. Lungs/Pleura: No pleural effusion or pneumothorax. There is confluent opacity posteriorly in the basilar right lower lobe with evidence of volume loss. Mild dependent atelectasis is present in the left  lower lobe. Scattered lung cysts are again noted. There is mild centrilobular emphysema. Scattered small bilateral lung nodules do not appear significantly changed from the prior CT within limitations of motion artifact on both examinations. The largest nodule in the left lung measures 4 x 3 mm in the apical upper lobe (series 3, image 20), while the largest on the right measures 3 x 3 mm in the anterior upper lobe (series 3, image 80). Upper Abdomen: Partially visualized bilateral renal atrophy with small low-density and hyperattenuating lesions in both upper poles, incompletely evaluated though similar in size to those demonstrated on a 05/24/2016 abdominal CT and likely reflecting cysts of varying complexity. Musculoskeletal: Posterior right eleventh and twelfth rib fractures demonstrate approximately 1 shaft width displacement. There is a nondisplaced fracture of the right posterolateral tenth rib. 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 IMPRESSION: 1. Displaced posterior right eleventh and twelfth rib fractures. Nondisplaced right tenth rib fracture. 2. Right greater than left lower lobe atelectasis. 3. Emphysema with unchanged small lung nodules. Electronically Signed   By: Logan Bores M.D.   On: 12/04/2016 14:31    Assessment/Plan:  Chest CT personally reviewed. Posterior rib fractures without pneumothorax. Patient appears stable at this point. We'll reexamine and repeat chest x-ray in the morning. For chest tube at this time  Jerrol Banana  Burt Knack, MD, FACS

## 2016-12-04 NOTE — ED Triage Notes (Signed)
Report per EMS: Pt fell tonight, unknown cause, pt denies LoC prior to or after fall. Pt presents w/ c/o R rib pain secondary to falling onto edge of a small table. Pt has bruising to R lateral/posterior ribs and is tender to palpation. Pt is presently is sleeping and is difficult to wake during triage process. Pt is on dialysis, last dialysis 12/03/16. Pt normally wears O2 at home. Fistula in LUE + bruit, + thrill.

## 2016-12-04 NOTE — ED Provider Notes (Signed)
[facility] Emergency Department Provider Note   ____________________________________________   First MD Initiated Contact with Patient 12/04/16 [phone]     (approximate)  I have reviewed the triage vital signs and the nursing notes.   HISTORY  Chief Complaint Fall and Rib Injury  Patient with a history of sundowning and unable to answer questions.  HPI Michele Nash is a 61 y.o. female who comes into the hospital today after a fall. According to EMS and the nurse the patient has a history of confusion at night time. The patient fell tonight and it was unwitnessed and unknown the cause. The patient denies any loss of consciousness before or after the fall. She is having some pain to her right ribs after falling onto the edge of a small table. The patient was initially sleeping when she arrived and did not want to be bothered. 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 The patient initially stated that she wore 4 L of O2 by [place] at home but it was verified that the patient wears 2 L as needed at home.The patient is concerned that she may have broken her ribs she is here this evening for evaluation.   Past Medical History:  Diagnosis Date  . Anemia   . Apnea, sleep    for sleep study 10/17/15-no cpap yet  . Arthritis   . Bronchitis   . Chronic kidney disease   . COPD (chronic obstructive pulmonary disease) ([place])   . Depression   . Dialysis patient ([place]) 2014  . GERD (gastroesophageal reflux disease)   . Headache   . Hyperlipidemia   . Hypertension   . Obesity   . Renal dialysis device, implant, or graft complication    RIGHT CHEST CATH    Patient Active Problem List   Diagnosis Date Noted  . Respiratory failure, acute and chronic ([place]) 12/04/2016  . Hyperlipidemia 11/28/2016  . COPD exacerbation ([place]) 10/29/2016  . Altered mental status   . Fall   . Traumatic hematoma of forehead   . Acute  respiratory failure with hypercapnia ([place]) 10/06/2016  . Pulmonary edema 09/14/2016  . Respiratory failure ([place]) 07/23/2016  . GIB (gastrointestinal bleeding) 07/09/2016  . Protein-calorie malnutrition, severe 05/29/2016  . Hyperglycemia 05/28/2016  . Pain in limb 10/28/2015  . ESRD on dialysis ([place]) 10/28/2015  . Essential hypertension 10/28/2015  . ESRD on hemodialysis ([place]) 03/29/2015  . HTN (hypertension) 03/29/2015  . COPD (chronic obstructive pulmonary disease) ([place]) 03/29/2015  . GAD (generalized anxiety disorder) 03/29/2015    Past Surgical History:  Procedure Laterality Date  . ABDOMINAL HYSTERECTOMY    . COLONOSCOPY  2011  . COLONOSCOPY WITH PROPOFOL N/A 09/20/2015   Procedure: COLONOSCOPY WITH PROPOFOL;  Surgeon: Lucilla Lame, MD;  Location: [facility] ENDOSCOPY;  Service: Endoscopy;  Laterality: N/A;  . DIALYSIS FISTULA CREATION    . ESOPHAGOGASTRODUODENOSCOPY N/A 07/13/2016   Procedure: ESOPHAGOGASTRODUODENOSCOPY (EGD);  Surgeon: Lollie Sails, MD;  Location[facility] ENDOSCOPY;  Service: Endoscopy;  Laterality: N/A;  . ESOPHAGOGASTRODUODENOSCOPY (EGD) WITH PROPOFOL N/A 06/02/2016   Procedure: ESOPHAGOGASTRODUODENOSCOPY (EGD) WITH PROPOFOL;  Surgeon: Manya Silvas, MD;  Location: [facility] ENDOSCOPY;  Service: Endoscopy;  Laterality: N/A;  . PERIPHERAL VASCULAR CATHETERIZATION N/A 05/10/2015   Procedure: A/V Shuntogram/Fistulagram;  Surgeon: Katha Cabal, MD;  Location: [place] CV LAB;  Service: Cardiovascular;  Laterality: N/A;  . PERIPHERAL VASCULAR CATHETERIZATION Left 05/10/2015   Procedure: A/V Shunt Intervention;  Surgeon:  Katha Cabal, MD;  Location: [place] CV LAB;  Service: Cardiovascular;  Laterality: Left;  . PERIPHERAL VASCULAR CATHETERIZATION Left 08/23/2015   Procedure: A/V Shuntogram/Fistulagram;  Surgeon: Katha Cabal, MD;  Location: [place] CV LAB;  Service: Cardiovascular;  Laterality: Left;  . PERIPHERAL VASCULAR CATHETERIZATION N/A  08/23/2015   Procedure: A/V Shunt Intervention;  Surgeon: Katha Cabal, MD;  Location: [place] CV LAB;  Service: Cardiovascular;  Laterality: N/A;  . PERIPHERAL VASCULAR CATHETERIZATION  08/23/2015   Procedure: Dialysis/Perma Catheter Insertion;  Surgeon: Katha Cabal, MD;  Location: [place] CV LAB;  Service: Cardiovascular;;  . PERIPHERAL VASCULAR CATHETERIZATION N/A 01/03/2016   Procedure: Dialysis/Perma Catheter Removal;  Surgeon: Katha Cabal, MD;  Location: [place] CV LAB;  Service: Cardiovascular;  Laterality: N/A;  . REVISON OF ARTERIOVENOUS FISTULA Left 10/28/2015   Procedure: REVISON OF ARTERIOVENOUS FISTULA WITH ARTEGRAFT;  Surgeon: Katha Cabal, MD;  Location: [facility] ORS;  Service: Vascular;  Laterality: Left;  . WOUND DEBRIDEMENT Left 10/28/2015   Procedure: Resection of shoulder cyst ( left );  Surgeon: Katha Cabal, MD;  Location: [facility] ORS;  Service: Vascular;  Laterality: Left;    Prior to Admission medications   Medication Sig Start Date End Date Taking? Authorizing Provider  albuterol (PROVENTIL HFA;VENTOLIN HFA) 108 (90 BASE) MCG/ACT inhaler Inhale 2 puffs into the lungs every 6 (six) hours as needed for wheezing or shortness of breath.    Historical Provider, MD  ALPRAZolam Duanne Moron) 0.5 MG tablet Take 0.5 mg by mouth 2 (two) times daily.    Historical Provider, MD  amLODipine (NORVASC) 10 MG tablet Take 10 mg by mouth daily.     Historical Provider, MD  calcium acetate (PHOSLO) 667 MG capsule Take 1,334 mg by mouth 3 (three) times daily.     Historical Provider, MD  cinacalcet (SENSIPAR) 30 MG tablet Take 60 mg by mouth daily with breakfast.     Historical Provider, MD  ezetimibe (ZETIA) 10 MG tablet Take 10 mg by mouth daily.     Historical Provider, MD  feeding supplement, ENSURE ENLIVE, (ENSURE ENLIVE) LIQD Take 237 mLs by mouth 3 (three) times daily between meals. 10/11/16   [name], MD  fluticasone (FLONASE) 50 MCG/ACT nasal  spray Place 2 sprays into both nostrils daily as needed for rhinitis.    Historical Provider, MD  furosemide (LASIX) 40 MG tablet Take 40 mg by mouth every Tuesday, Thursday, Saturday, and Sunday.     Historical Provider, MD  lidocaine-prilocaine (EMLA) cream Apply 1 application topically as needed (prior to accessing port).    Historical Provider, MD  losartan (COZAAR) 100 MG tablet Take 100 mg by mouth daily.     Historical Provider, MD  metoprolol (LOPRESSOR) 100 MG tablet Take 100 mg by mouth 2 (two) times daily.    Historical Provider, MD  multivitamin (RENA-VIT) TABS tablet Take 1 tablet by mouth at bedtime.     Historical Provider, MD  omeprazole (PRILOSEC) 20 MG capsule Take 20 mg by mouth daily.    Historical Provider, MD  sertraline (ZOLOFT) 50 MG tablet Take 50 mg by mouth daily.     Historical Provider, MD  triamcinolone ointment (KENALOG) 0.1 % Apply 1 application topically 2 (two) times daily.    Historical Provider, MD    Allergies Chantix [varenicline] and Sulfa antibiotics  Family History  Problem Relation Age of Onset  . Heart disease Mother   . Cancer Father   . Cancer Sister  Social History Social History  Substance Use Topics  . Smoking status: Current Every Day Smoker    Packs/day: 0.50    Years: 40.00    Types: Cigarettes    Last attempt to quit: 10/21/2016  . Smokeless tobacco: Never Used  . Alcohol use No    Review of Systems Constitutional: No fever/chills Eyes: No visual changes. ENT: No sore throat. Cardiovascular: Right-sided Respiratory:  shortness of breath. Gastrointestinal: No abdominal pain.  No nausea, no vomiting.  No diarrhea.  No constipation. Genitourinary: Negative for dysuria. Musculoskeletal: Negative for back pain. Skin: Negative for rash. Neurological: Negative for headaches, focal weakness or numbness.  10-point ROS otherwise negative.  ____________________________________________   PHYSICAL EXAM:  VITAL SIGNS: ED  Triage Vitals  Enc Vitals Group     BP 12/04/16 0217 (!) 160/111     Pulse Rate 12/04/16 0217 70     Resp 12/04/16 0217 (!) 40     Temp 12/04/16 0217 99 F (37.2 C)     Temp Source 12/04/16 0217 Oral     SpO2 12/04/16 0217 (!) 87 %     Weight 12/04/16 0231 135 lb (61.2 kg)     Height 12/04/16 0231 5\' 5"  (1.651 m)     Head Circumference --      Peak Flow --      Pain Score --      Pain Loc --      Pain Edu? --      Excl. in [place]? --     Constitutional:Somnolent but arousable. Patient falls asleep while you are asking questions and does not answer them. Patient in some mild to moderate distress when moved. Eyes: Conjunctivae are normal. PERRL. EOMI. Head: Atraumatic. Nose: No congestion/rhinnorhea. Mouth/Throat: Mucous membranes are moist.  Oropharynx non-erythematous. Neck: No cervical spine tenderness to palpation. Cardiovascular: Normal rate, regular rhythm. Grossly normal heart sounds.  Good peripheral circulation. Respiratory: Normal respiratory effort.  No retractions. Lungs CTAB. Gastrointestinal: Soft and nontender. No distention. Positive bowel sounds Musculoskeletal: Tenderness to palpation of right lower rib with some bruising posterior laterally  Neurologic:  Normal speech and language. Psychiatric: Patient somnolent but arousable  ____________________________________________   LABS (all labs ordered are listed, but only abnormal results are displayed)  Labs Reviewed  URINALYSIS, COMPLETE (UACMP) WITH MICROSCOPIC - Abnormal; Notable for the following:       Result Value   Color, Urine YELLOW (*)    APPearance HAZY (*)    Glucose, UA >=500 (*)    Hgb urine dipstick SMALL (*)    Protein, ur >=300 (*)    Leukocytes, UA MODERATE (*)    Bacteria, UA RARE (*)    Squamous Epithelial / LPF 6-30 (*)    All other components within normal limits  CBC WITH DIFFERENTIAL/PLATELET - Abnormal; Notable for the following:    RDW 19.1 (*)    Monocytes Absolute 1.2 (*)    All  other components within normal limits  COMPREHENSIVE METABOLIC PANEL - Abnormal; Notable for the following:    Potassium 5.4 (*)    Chloride 98 (*)    Glucose, Bld 112 (*)    BUN 32 (*)    Creatinine, Ser 6.67 (*)    Calcium 8.7 (*)    Alkaline Phosphatase 222 (*)    GFR calc non Af Amer 6 (*)    GFR calc Af Amer 7 (*)    All other components within normal limits  CULTURE, BLOOD (ROUTINE X 2)  CULTURE, BLOOD (  ROUTINE X 2)  URINE CULTURE  LACTIC ACID, PLASMA  PROTIME-INR  CBC WITH DIFFERENTIAL/PLATELET   ____________________________________________  EKG  ED ECG REPORT I, Loney Hering, the attending physician, personally viewed and interpreted this ECG.   Date: 12/04/2016  EKG Time: 225  Rate: 70  Rhythm: normal sinus rhythm  Axis: normal  Intervals:none  ST&T Change: none  ____________________________________________  RADIOLOGY  CXR ____________________________________________   PROCEDURES  Procedure(s) performed: None  Procedures  Critical Care performed: No  ____________________________________________   INITIAL IMPRESSION / ASSESSMENT AND PLAN / ED COURSE  Pertinent labs & imaging results that were available during my care of the patient were reviewed by me and considered in my medical decision making (see chart for details).  This is a 61 year old female who comes into the hospital today with a fall into a table. The patient has some right sided rib fractures. As well as infiltration at the right lung base. My concern is that the patient may have a pulmonary contusion. On August into the room the patient was asleep but she was hypoxic into the 70s and 80s while on 4 L. I did arouse the patient and she did ask me to leave her alone but she fell back asleep. The nurse then stated that the patient has some confusion mainly at nighttime. The patient did have some blood work evaluated which was unremarkable aside from her elevated creatinine which is due  to her renal disease on dialysis. The patient does have too numerous to count whites in her urine which was cathetered but again she does not make much urine. Given the patient's somnolence as well as pulmonary contusion and rib fractures I will admit the patient to the hospitalist service. The patient will receive a dose of ceftriaxone and a CT of her head. She will be admitted.  Clinical Course as of Dec 04 826  Tue Dec 04, 2016  [id number] Displaced right posterolateral rib fractures. Infiltration or atelectasis in the right lung base posteriorly. Cardiac enlargement.   DG Chest 2 View [AW]    Clinical Course User Index [AW] Loney Hering, MD     ____________________________________________   FINAL CLINICAL IMPRESSION(S) / ED DIAGNOSES  Final diagnoses:  Closed fracture of multiple ribs of right side, initial encounter  Contusion of right lung, initial encounter  Hypoxia  Hypertension, unspecified type      NEW MEDICATIONS STARTED DURING THIS VISIT:  New Prescriptions   No medications on file     Note:  This document was prepared using Dragon voice recognition software and may include unintentional dictation errors.    Loney Hering, MD 12/04/16 [id number]

## 2016-12-04 NOTE — Progress Notes (Signed)
PT Cancellation Note  Patient Details Name: Michele Nash MRN: [id number] DOB: 10/11/1956   Cancelled Treatment:    Reason Eval/Treat Not Completed: Patient not medically ready Spoke with nursing who reports that pt is not appropriate for PT at this time.  Will check back tomorrow as appropriate.   Kreg Shropshire, DPT 12/04/2016, 4:38 PM

## 2016-12-04 NOTE — H&P (Signed)
[facility] NAME: Michele Nash    MR#:  [id number]  DATE OF BIRTH:  1956-06-25  DATE OF ADMISSION:  12/04/2016  PRIMARY CARE PHYSICIAN: [name]   REQUESTING/REFERRING PHYSICIAN: Dr. Dahlia Client  CHIEF COMPLAINT:   Chief Complaint  Patient presents with  . Fall  . Rib Injury    HISTORY OF PRESENT ILLNESS:  Michele Nash  is a 61 y.o. female with a known history of COPD, Chronic resp failure on 2 L O2, ESRD on HD with recurrent falls here after she fell and hit chest on a table. CXR showed right posterolateral rib fractures 6,7 and posterior 11,12. Patient is drowsy but wakes up on calling her name and gives history. She mentions she has had recurrent falls recently. Patient was going to the bathroom and tripped and fell landing on her right chest on to an edge of the table. Acute pain caused her to come to the emergency room with EMS. She has received 1 dose of Percocet. Presently drowsy. Chest x-ray also raises concern that there is any confusion.  PAST MEDICAL HISTORY:   Past Medical History:  Diagnosis Date  . Anemia   . Apnea, sleep    for sleep study 10/17/15-no cpap yet  . Arthritis   . Bronchitis   . Chronic kidney disease   . COPD (chronic obstructive pulmonary disease) ([place])   . Depression   . Dialysis patient ([place]) 2014  . GERD (gastroesophageal reflux disease)   . Headache   . Hyperlipidemia   . Hypertension   . Obesity   . Renal dialysis device, implant, or graft complication    RIGHT CHEST CATH    PAST SURGICAL HISTORY:   Past Surgical History:  Procedure Laterality Date  . ABDOMINAL HYSTERECTOMY    . COLONOSCOPY  2011  . COLONOSCOPY WITH PROPOFOL N/A 09/20/2015   Procedure: COLONOSCOPY WITH PROPOFOL;  Surgeon: Lucilla Lame, MD;  Location: [facility] ENDOSCOPY;  Service: Endoscopy;  Laterality: N/A;  . DIALYSIS FISTULA CREATION    . ESOPHAGOGASTRODUODENOSCOPY N/A 07/13/2016   Procedure:  ESOPHAGOGASTRODUODENOSCOPY (EGD);  Surgeon: Lollie Sails, MD;  Location: [facility] ENDOSCOPY;  Service: Endoscopy;  Laterality: N/A;  . ESOPHAGOGASTRODUODENOSCOPY (EGD) WITH PROPOFOL N/A 06/02/2016   Procedure: ESOPHAGOGASTRODUODENOSCOPY (EGD) WITH PROPOFOL;  Surgeon: Manya Silvas, MD;  Location: [facility] ENDOSCOPY;  Service: Endoscopy;  Laterality: N/A;  . PERIPHERAL VASCULAR CATHETERIZATION N/A 05/10/2015   Procedure: A/V Shuntogram/Fistulagram;  Surgeon: Katha Cabal, MD;  Location: [facility] CV LAB;  Service: Cardiovascular;  Laterality: N/A;  . PERIPHERAL VASCULAR CATHETERIZATION Left 05/10/2015   Procedure: A/V Shunt Intervention;  Surgeon: Katha Cabal, MD;  Location: [place] CV LAB;  Service: Cardiovascular;  Laterality: Left;  . PERIPHERAL VASCULAR CATHETERIZATION Left 08/23/2015   Procedure: A/V Shuntogram/Fistulagram;  Surgeon: Katha Cabal, MD;  Location: [place] CV LAB;  Service: Cardiovascular;  Laterality: Left;  . PERIPHERAL VASCULAR CATHETERIZATION N/A 08/23/2015   Procedure: A/V Shunt Intervention;  Surgeon: Katha Cabal, MD;  Location: [place] CV LAB;  Service: Cardiovascular;  Laterality: N/A;  . PERIPHERAL VASCULAR CATHETERIZATION  08/23/2015   Procedure: Dialysis/Perma Catheter Insertion;  Surgeon: Katha Cabal, MD;  Location: [place] CV LAB;  Service: Cardiovascular;;  . PERIPHERAL VASCULAR CATHETERIZATION N/A 01/03/2016   Procedure: Dialysis/Perma Catheter Removal;  Surgeon: Katha Cabal, MD;  Location: [place] CV LAB;  Service: Cardiovascular;  Laterality: N/A;  . REVISON OF ARTERIOVENOUS FISTULA Left 10/28/2015  Procedure: REVISON OF ARTERIOVENOUS FISTULA WITH ARTEGRAFT;  Surgeon: Katha Cabal, MD;  Location: [facility] ORS;  Service: Vascular;  Laterality: Left;  . WOUND DEBRIDEMENT Left 10/28/2015   Procedure: Resection of shoulder cyst ( left );  Surgeon: Katha Cabal, MD;  Location: [facility] ORS;  Service: Vascular;   Laterality: Left;    SOCIAL HISTORY:   Social History  Substance Use Topics  . Smoking status: Current Every Day Smoker    Packs/day: 0.50    Years: 40.00    Types: Cigarettes    Last attempt to quit: 10/21/2016  . Smokeless tobacco: Never Used  . Alcohol use No    FAMILY HISTORY:   Family History  Problem Relation Age of Onset  . Heart disease Mother   . Cancer Father   . Cancer Sister     DRUG ALLERGIES:   Allergies  Allergen Reactions  . Chantix [Varenicline]     hallucinations  . Sulfa Antibiotics Itching, Swelling, Rash and Other (See Comments)    Reaction:  Facial/body swelling     REVIEW OF SYSTEMS:   Review of Systems  Constitutional: Positive for malaise/fatigue. Negative for chills and fever.  HENT: Negative for sore throat.   Eyes: Negative for blurred vision, double vision and pain.  Respiratory: Positive for cough and shortness of breath. Negative for hemoptysis and wheezing.   Cardiovascular: Positive for chest pain. Negative for palpitations, orthopnea and leg swelling.  Gastrointestinal: Negative for abdominal pain, constipation, diarrhea, heartburn, nausea and vomiting.  Genitourinary: Negative for dysuria and hematuria.  Musculoskeletal: Positive for falls. Negative for back pain and joint pain.  Skin: Negative for rash.  Neurological: Positive for weakness. Negative for sensory change, speech change, focal weakness and headaches.  Endo/Heme/Allergies: Does not bruise/bleed easily.  Psychiatric/Behavioral: Positive for memory loss. Negative for depression. The patient is not nervous/anxious.     MEDICATIONS AT HOME:   Prior to Admission medications   Medication Sig Start Date End Date Taking? Authorizing Provider  albuterol (PROVENTIL HFA;VENTOLIN HFA) 108 (90 BASE) MCG/ACT inhaler Inhale 2 puffs into the lungs every 6 (six) hours as needed for wheezing or shortness of breath.    Historical Provider, MD  ALPRAZolam Duanne Moron) 0.5 MG tablet Take  0.5 mg by mouth 2 (two) times daily.    Historical Provider, MD  amLODipine (NORVASC) 10 MG tablet Take 10 mg by mouth daily.     Historical Provider, MD  calcium acetate (PHOSLO) 667 MG capsule Take 1,334 mg by mouth 3 (three) times daily.     Historical Provider, MD  cinacalcet (SENSIPAR) 30 MG tablet Take 60 mg by mouth daily with breakfast.     Historical Provider, MD  ezetimibe (ZETIA) 10 MG tablet Take 10 mg by mouth daily.     Historical Provider, MD  feeding supplement, ENSURE ENLIVE, (ENSURE ENLIVE) LIQD Take 237 mLs by mouth 3 (three) times daily between meals. 10/11/16   [name], MD  fluticasone (FLONASE) 50 MCG/ACT nasal spray Place 2 sprays into both nostrils daily as needed for rhinitis.    Historical Provider, MD  furosemide (LASIX) 40 MG tablet Take 40 mg by mouth every Tuesday, Thursday, Saturday, and Sunday.     Historical Provider, MD  lidocaine-prilocaine (EMLA) cream Apply 1 application topically as needed (prior to accessing port).    Historical Provider, MD  losartan (COZAAR) 100 MG tablet Take 100 mg by mouth daily.     Historical Provider, MD  metoprolol (LOPRESSOR) 100 MG tablet Take 100  mg by mouth 2 (two) times daily.    Historical Provider, MD  multivitamin (RENA-VIT) TABS tablet Take 1 tablet by mouth at bedtime.     Historical Provider, MD  omeprazole (PRILOSEC) 20 MG capsule Take 20 mg by mouth daily.    Historical Provider, MD  sertraline (ZOLOFT) 50 MG tablet Take 50 mg by mouth daily.     Historical Provider, MD  triamcinolone ointment (KENALOG) 0.1 % Apply 1 application topically 2 (two) times daily.    Historical Provider, MD     VITAL SIGNS:  Blood pressure (!) 175/84, pulse (!) 59, temperature 99 F (37.2 C), temperature source Oral, resp. rate 16, height 5\' 5"  (1.651 m), weight 61.2 kg (135 lb), SpO2 95 %.  PHYSICAL EXAMINATION:  Physical Exam  GENERAL:  61 y.o.-year-old patient lying in the bed with no acute distress.  EYES: Pupils equal,  round, reactive to light and accommodation. No scleral icterus. Extraocular muscles intact.  HEENT: Head atraumatic, normocephalic. Oropharynx and nasopharynx clear. No oropharyngeal erythema, moist oral mucosa  NECK:  Supple, no jugular venous distention. No thyroid enlargement, no tenderness.  LUNGS: Decreased air entry RLL. Tenderness right lateral and posterior chest. CARDIOVASCULAR: S1, S2 normal. No murmurs, rubs, or gallops.  ABDOMEN: Soft, nontender, nondistended. Bowel sounds present. No organomegaly or mass.  EXTREMITIES: No pedal edema, cyanosis, or clubbing. + 2 pedal & radial pulses b/l.  LUE AVF NEUROLOGIC: Cranial nerves II through XII are intact. No focal Motor or sensory deficits appreciated b/l PSYCHIATRIC: The patient is drowzy. Wakes up on calling her name. SKIN: No obvious rash, lesion, or ulcer.   LABORATORY PANEL:   CBC  Recent Labs Lab 12/04/16 0310  WBC 8.4  HGB 14.2  HCT 43.1  PLT 298   ------------------------------------------------------------------------------------------------------------------  Chemistries   Recent Labs Lab 12/04/16 0354  NA 140  K 5.4*  CL 98*  CO2 32  GLUCOSE 112*  BUN 32*  CREATININE 6.67*  CALCIUM 8.7*  AST 31  ALT 22  ALKPHOS 222*  BILITOT 0.6   ------------------------------------------------------------------------------------------------------------------  Cardiac Enzymes No results for input(s): TROPONINI in the last 168 hours. ------------------------------------------------------------------------------------------------------------------  RADIOLOGY:  Dg Chest 2 View  Result Date: 12/04/2016 CLINICAL DATA:  Patient fell tonight. Right rib pain. Bruising to the right lateral and posterior ribs. History of hypertension. EXAM: CHEST  2 VIEW COMPARISON:  10/29/2016 FINDINGS: Shallow inspiration. Cardiac enlargement without vascular congestion. Focal infiltration in the right lung base posteriorly could  represent atelectasis, contusion, or pneumonia. This is new since previous study. Displaced right posterior rib fractures seen best on the lateral view. No blunting of costophrenic angles. No pneumothorax. Calcified and tortuous aorta. IMPRESSION: Displaced right posterolateral rib fractures. Infiltration or atelectasis in the right lung base posteriorly. Cardiac enlargement. Electronically Signed   By: Lucienne Capers M.D.   On: 12/04/2016 03:13   Ct Head Wo Contrast  Result Date: 12/04/2016 CLINICAL DATA:  Confusion EXAM: CT HEAD WITHOUT CONTRAST TECHNIQUE: Contiguous axial images were obtained from the base of the skull through the vertex without intravenous contrast. COMPARISON:  10/16/2016 FINDINGS: Brain: No evidence of acute infarction, hemorrhage, hydrocephalus, extra-axial collection or mass lesion/mass effect. Vascular: No hyperdense vessel or unexpected calcification. Skull: Normal. Negative for fracture or focal lesion. Sinuses/Orbits: No acute finding. Other: None. IMPRESSION: No acute abnormality noted. Electronically Signed   By: Inez Catalina M.D.   On: 12/04/2016 09:05     IMPRESSION AND PLAN:   * Acute on chronic respiratory failure due to  rib fractures and possible lung contusion. Stat venous blood gas showed PCO2 of 90 with pH of 7.15. We will start BiPAP. Transfer to stepdown unit. Patient is critically ill  * Right Posterolateral 67 ribs and posterior 1112 ribs fracture with possible lung contusion Check a CT scan of the chest. Consult surgery. Incentive spirometer. Oxygen to keep sats over 90%. Nebulizers when necessary. No pneumothorax on the chest x-ray. Discussed with radiologist Dr. Martinique.  * COPD. No wheezing. Good air entry except at the right lower part. Nebs when necessary and home inhalers.  * End-stage renal disease on hemodialysis with mild hyperkalemia. Consult nephrology for dialysis.  * DVT prophylaxis with heparin  All the records are reviewed and case  discussed with ED provider. Management plans discussed with the patient, family and they are in agreement.  CODE STATUS: FULL  TOTAL CC TIME TAKING CARE OF THIS PATIENT: 40 minutes.   Hillary Bow R M.D on 12/04/2016 at 10:14 AM  Between 7am to 6pm - Pager - [phone]  After 6pm go to [email or web address] - password [email or web address] [facility]  Office  [phone]  CC: Primary care physician; [name]  Note: This dictation was prepared with Dragon dictation along with smaller phrase technology. Any transcriptional errors that result from this process are unintentional.

## 2016-12-04 NOTE — Progress Notes (Signed)
Around 1400 upon reentry in pt room pt was lethargic and minimally responsive to voice; however, still following very simple commands. Bi-pap placed at 35%. Pt transported to CT with Bi-pap in place-ABG to be repeated at 1600.

## 2016-12-04 NOTE — Progress Notes (Signed)
12/04/2016  12:05 PM  Came to pt room with bed alarm sounding.  Pt adamant that she did not want the bed alarm.  Explained fall risk and rationale for bed alarm, especially with pt's admission reason of injury from falling at home.  Pt became agitated and began yelling.  I agreed that we could leave the bed alarm off as she promised to call before getting up  Dola Argyle, RN

## 2016-12-04 NOTE — ED Notes (Signed)
Patient transported to CT 

## 2016-12-04 NOTE — Progress Notes (Signed)
Placed on bipap,patient removed,refused to wear

## 2016-12-04 NOTE — Progress Notes (Signed)
12/04/2016 11:53 AM  Asked RT to place pt on Bi-pap while awaiting transfer to stepdown unit.  Though he did place pt on bi-pap.  She would not wear it at this time.  When asked, the pt said "It was too tight, I don't know.  I don't remember."  Will continue to monitor respiratory status.  Dola Argyle, RN

## 2016-12-05 ENCOUNTER — Inpatient Hospital Stay: Payer: Medicare Other

## 2016-12-05 DIAGNOSIS — J9622 Acute and chronic respiratory failure with hypercapnia: Secondary | ICD-10-CM

## 2016-12-05 DIAGNOSIS — J9621 Acute and chronic respiratory failure with hypoxia: Secondary | ICD-10-CM

## 2016-12-05 LAB — URINE CULTURE: Culture: NO GROWTH

## 2016-12-05 LAB — PHOSPHORUS: Phosphorus: 6.5 mg/dL — ABNORMAL HIGH (ref 2.5–4.6)

## 2016-12-05 MED ORDER — TRAMADOL HCL 50 MG PO TABS
50.0000 mg | ORAL_TABLET | Freq: Once | ORAL | Status: AC
  Administered 2016-12-05: 50 mg via ORAL

## 2016-12-05 MED ORDER — LIDOCAINE-PRILOCAINE 2.5-2.5 % EX CREA
1.0000 "application " | TOPICAL_CREAM | CUTANEOUS | Status: DC | PRN
  Filled 2016-12-05: qty 5

## 2016-12-05 MED ORDER — PENTAFLUOROPROP-TETRAFLUOROETH EX AERO
1.0000 "application " | INHALATION_SPRAY | CUTANEOUS | Status: DC | PRN
  Filled 2016-12-05: qty 30

## 2016-12-05 MED ORDER — SODIUM CHLORIDE 0.9 % IV SOLN: 100.0000 mL | INTRAVENOUS | Status: DC | PRN

## 2016-12-05 MED ORDER — HEPARIN SODIUM (PORCINE) 1000 UNIT/ML DIALYSIS
1000.0000 [IU] | INTRAMUSCULAR | Status: DC | PRN
  Filled 2016-12-05: qty 1

## 2016-12-05 MED ORDER — SIMETHICONE 80 MG PO CHEW
80.0000 mg | CHEWABLE_TABLET | Freq: Once | ORAL | Status: AC
  Administered 2016-12-05: 80 mg via ORAL
  Filled 2016-12-05: qty 1

## 2016-12-05 MED ORDER — LIDOCAINE HCL (PF) 1 % IJ SOLN
5.0000 mL | INTRAMUSCULAR | Status: DC | PRN
  Filled 2016-12-05: qty 5

## 2016-12-05 MED ORDER — ALTEPLASE 2 MG IJ SOLR: 2.0000 mg | Freq: Once | INTRAMUSCULAR | Status: DC | PRN

## 2016-12-05 NOTE — Plan of Care (Signed)
Problem: Pain Managment: Goal: General experience of comfort will improve Outcome: Not Progressing Recent pain med given for rib pain.

## 2016-12-05 NOTE — Progress Notes (Signed)
Post hd vitals 

## 2016-12-05 NOTE — Progress Notes (Signed)
Pre tx info 

## 2016-12-05 NOTE — Progress Notes (Signed)
CC: s/p fall and right rib fx Subjective: Pt somnolent and placed on bipap. [place] simple commands only Full ROS unable to obtain due to MS CXR no PTX, atelectasies  Objective: Vital signs in last 24 hours: Temp:  [97.6 F (36.4 C)-98.6 F (37 C)] 98.4 F (36.9 C) (01/10 1600) Pulse Rate:  [60-73] 67 (01/10 1600) Resp:  [15-29] 22 (01/10 1600) BP: (112-166)/(51-97) 125/66 (01/10 1600) SpO2:  [84 %-100 %] 98 % (01/10 1600) Last BM Date:  (ukn at this time)  Intake/Output from previous day: No intake/output data recorded. Intake/Output this shift: Total I/O In: 68 [IV Piggyback:50] Out: -   Physical exam:  chronically ill appearing  female, some resp distress  Neuro:  Somnolent, follows commands, not focalized HEENT:  Supple, no JVD Cardiovascular: NSR, s1s2, rrr, Lungs:  Diminished throughout, even,  on Bipap. Rigth sided tenderness chest wall , no sub q air Abdomen:  +BS x, soft, non tender, non distened Ext: well perfused, warm  Lab Results: CBC   Recent Labs  12/04/16 0310  WBC 8.4  HGB 14.2  HCT 43.1  PLT 298   BMET  Recent Labs  12/04/16 0354  NA 140  K 5.4*  CL 98*  CO2 32  GLUCOSE 112*  BUN 32*  CREATININE 6.67*  CALCIUM 8.7*   PT/INR  Recent Labs  12/04/16 0337  LABPROT 13.8  INR 1.06   ABG  Recent Labs  12/04/16 1611  PHART 7.26*  HCO3 28.3*    Studies/Results: Dg Chest 2 View  Result Date: 12/04/2016 CLINICAL DATA:  Patient fell tonight. Right rib pain. Bruising to the right lateral and posterior ribs. History of hypertension. EXAM: CHEST  2 VIEW COMPARISON:  10/29/2016 FINDINGS: Shallow inspiration. Cardiac enlargement without vascular congestion. Focal infiltration in the right lung base posteriorly could represent atelectasis, contusion, or pneumonia. This is new since previous study. Displaced right posterior rib fractures seen best on the lateral view. No blunting of costophrenic angles. No pneumothorax. Calcified and  tortuous aorta. IMPRESSION: Displaced right posterolateral rib fractures. Infiltration or atelectasis in the right lung base posteriorly. Cardiac enlargement. Electronically Signed   By: Lucienne Capers M.D.   On: 12/04/2016 03:13   Ct Head Wo Contrast  Result Date: 12/04/2016 CLINICAL DATA:  Confusion EXAM: CT HEAD WITHOUT CONTRAST TECHNIQUE: Contiguous axial images were obtained from the base of the skull through the vertex without intravenous contrast. COMPARISON:  10/16/2016 FINDINGS: Brain: No evidence of acute infarction, hemorrhage, hydrocephalus, extra-axial collection or mass lesion/mass effect. Vascular: No hyperdense vessel or unexpected calcification. Skull: Normal. Negative for fracture or focal lesion. Sinuses/Orbits: No acute finding. Other: None. IMPRESSION: No acute abnormality noted. Electronically Signed   By: Inez Catalina M.D.   On: 12/04/2016 09:05   Ct Chest Wo Contrast  Result Date: 12/04/2016 CLINICAL DATA:  Fall with right rib fractures. Right rib pain. Initial encounter. EXAM: CT CHEST WITHOUT CONTRAST TECHNIQUE: Multidetector CT imaging of the chest was performed following the standard protocol without IV contrast. COMPARISON:  Chest radiographs 12/04/2016 and CT 01/06/2015 FINDINGS: The study is mildly degraded by motion artifact. Cardiovascular: Thoracic aortic and three-vessel coronary artery atherosclerosis. Mild ectasia of the ascending aorta which measures up to 3.8 cm in diameter, stable to slightly increased from prior. Cardiomegaly. No pericardial effusion. Mediastinum/Nodes: No enlarged axillary, mediastinal, or hilar lymph nodes identified on this unenhanced study. Calcified thyroid nodules. Lungs/Pleura: No pleural effusion or pneumothorax. There is confluent opacity posteriorly in the basilar right lower lobe with  evidence of volume loss. Mild dependent atelectasis is present in the left lower lobe. Scattered lung cysts are again noted. There is mild centrilobular  emphysema. Scattered small bilateral lung nodules do not appear significantly changed from the prior CT within limitations of motion artifact on both examinations. The largest nodule in the left lung measures 4 x 3 mm in the apical upper lobe (series 3, image 20), while the largest on the right measures 3 x 3 mm in the anterior upper lobe (series 3, image 80). Upper Abdomen: Partially visualized bilateral renal atrophy with small low-density and hyperattenuating lesions in both upper poles, incompletely evaluated though similar in size to those demonstrated on a 05/24/2016 abdominal CT and likely reflecting cysts of varying complexity. Musculoskeletal: Posterior right eleventh and twelfth rib fractures demonstrate approximately 1 shaft width displacement. There is a nondisplaced fracture of the right posterolateral tenth rib. 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 IMPRESSION: 1. Displaced posterior right eleventh and twelfth rib fractures. Nondisplaced right tenth rib fracture. 2. Right greater than left lower lobe atelectasis. 3. Emphysema with unchanged small lung nodules. Electronically Signed   By: Logan Bores M.D.   On: 12/04/2016 14:31   Dg Chest Port 1 View  Result Date: 12/05/2016 CLINICAL DATA:  Evaluation acute respiratory failure. EXAM: PORTABLE CHEST 1 VIEW COMPARISON:  12/04/2016 FINDINGS: Stable enlarged cardiac silhouette. No effusion, infiltrate pneumothorax. Interval decreased interstitial edema better seen on prior. IMPRESSION: 1. Persistent cardiomegaly. 2. Mild improvement in interstitial edema pattern. Electronically Signed   By: Suzy Bouchard M.D.   On: 12/05/2016 12:08    Anti-infectives: Anti-infectives    Start     Dose/Rate Route Frequency Ordered Stop   12/05/16 1000  cefTRIAXone (ROCEPHIN) IVPB 1 g     1 g 100 mL/hr over 30 Minutes Intravenous Every 24 hours 12/04/16 1150     12/04/16 0845  cefTRIAXone (ROCEPHIN) IVPB 1 g      1 g 100 mL/hr over 30 Minutes Intravenous  Once 12/04/16 0832 12/04/16 0939   12/04/16 0830  cefTRIAXone (ROCEPHIN) 1 g in dextrose 5 % 50 mL IVPB  Status:  Discontinued     1 g 100 mL/hr over 30 Minutes Intravenous  Once 12/04/16 0828 12/04/16 0831      Assessment/Plan: Multiple rib fx after a fall, no PTX, no need for surgical intervention or chest tube Recommend aggressive pain control, RT, bipap We will be available for any questions  Caroleen Hamman, MD, FACS  12/05/2016

## 2016-12-05 NOTE — Progress Notes (Signed)
Initial Nutrition Assessment  DOCUMENTATION CODES:   Not applicable  INTERVENTION:  -Continue EnsureEnlive at present; may need to adjust supplement based on electrolytes -Cater to pt preferences  NUTRITION DIAGNOSIS:   Inadequate oral intake related to acute illness as evidenced by meal completion < 25% (inability to take po).  GOAL:   Patient will meet greater than or equal to 90% of their needs  MONITOR:   PO intake, Supplement acceptance, Labs, Weight trends  REASON FOR ASSESSMENT:   Malnutrition Screening Tool    ASSESSMENT:    61 yo female admitted s/p fall with rib fractures, acute on chronic respiratory failure. Pt with hx of COPD, chronic respiratory failures, ESRD on HD, HTN, HL  Pt on Bipap, lethargic but arousable. Pt wanting to eat but not even able to stay awake for more than a few seconds before falling back asleep  Pt believes her weight has been stable but reports she does not know her dry weight.. Weight trending down per wt encounters. Pt reports she has been eating well at home, drinks 0-1 Ensure per day. Pt does not like Nepro  Nutrition-Focused physical exam completed. Findings are WDL for fat depletion, muscle depletion, and edema.   Labs: potassium 5.4, sodium wdl, Creatinine 6.67 Meds: phoslo, sensipar  Diet Order:  Diet renal with fluid restriction Fluid restriction: 1200 mL Fluid; Room service appropriate? Yes; Fluid consistency: Thin  Skin:  Reviewed, no issues  Last BM:  no documented Bm  Height:   Ht Readings from Last 1 Encounters:  12/04/16 5\' 5"  (1.651 m)    Weight:   Wt Readings from Last 1 Encounters:  12/04/16 123 lb 7.3 oz (56 kg)   Wt Readings from Last 10 Encounters:  12/04/16 123 lb 7.3 oz (56 kg)  11/26/16 131 lb (59.4 kg)  11/22/16 130 lb (59 kg)  11/13/16 131 lb 12.8 oz (59.8 kg)  10/30/16 122 lb 9.6 oz (55.6 kg)  10/16/16 138 lb (62.6 kg)  10/11/16 144 lb 13.5 oz (65.7 kg)  09/16/16 137 lb 8 oz (62.4 kg)   07/24/16 149 lb 14.6 oz (68 kg)  07/13/16 149 lb 7.6 oz (67.8 kg)    Ideal Body Weight:     BMI:  Body mass index is 20.54 kg/m.  Estimated Nutritional Needs:   Kcal:  1680-1960 kcals  Protein:  80-98 g   Fluid:  1000 mL plus UOP  EDUCATION NEEDS:   No education needs identified at this time  E[place]on, [place], [place] [phone] Pager  [phone] Weekend/On-Call Pager

## 2016-12-05 NOTE — Progress Notes (Signed)
Patient alert and oriented during assessment, however very lethargic. Patient agreed to wear bipap this morning until breakfast came. Placed on bipap, resting at this time. Michele Nash

## 2016-12-05 NOTE — Progress Notes (Signed)
Post hd assessment 

## 2016-12-05 NOTE — Consult Note (Signed)
Name: Michele Nash MRN: [id number] DOB: Nov 12, 1956    ADMISSION DATE:  12/04/2016 CONSULTATION DATE: 12/05/2016  REFERRING MD :  Dr. Darvin Neighbours  CHIEF COMPLAINT:  Fall and Rib Injury  BRIEF PATIENT DESCRIPTION: This is a 61 yo female who presented to [facility] ER 01/9 post fall at home and found to have multiple posterior right rib fractures in acute on chronic hypoxic hypercapnic respiratory failure secondary to ?right lung contusion  SIGNIFICANT EVENTS  01/9-Pt admitted to [facility] ICU post fall at home in ER found to have multiple posterior right rib fractures and in acute respiratory failure 01/10-PCCM consulted for acute on chronic hypoxic hypercapnic respiratory failure secondary to ?right lung contusion requiring Bipap   STUDIES:  CT Chest 01/9>>Displaced posterior right eleventh and twelfth rib fractures. Nondisplaced right tenth rib fracture. Right greater than left lower lobe atelectasis. Emphysema with unchanged small lung nodules. CT Head 01/9>>No acute abnormality noted  HISTORY OF PRESENT ILLNESS:   This is a 61 yo female with a PMH of ESRD on Hemodialysis, Obesity, Hypertension, Hyperlipidemia, GERD, Depression, COPD, Home O2 at 2L via nasal canula, Bronchitis, Arthritis, Sleep Apnea, and Anemia.  She presented to [facility] ER on 01/9 following an unwitnessed fall at home with c/o right sided rib pain after falling on the edge of a small table.  In the ER the pt was somnolent but arousable and hypoxic with O2 sats 70-80% on 4L O2 via nasal canula.  A CT Chest on 01/9 revealed the pt had displaced posterior right eleventh and twelfth rib fractures and nondisplaced right tenth rib fracture.  She was admitted to ICU due to acute on chronic hypoxic hypercapnic respiratory failure secondary to ?right lung contusion due to multiple rib fractures following a fall.  PCCM consulted 01/10 due to acute encephalopathy secondary to hypercapnia requiring intermittent Bipap.    PAST MEDICAL HISTORY :   has  a past medical history of Anemia; Apnea, sleep; Arthritis; Bronchitis; Chronic kidney disease; COPD (chronic obstructive pulmonary disease) ([place]); Depression; Dialysis patient [facility]) (2014); GERD (gastroesophageal reflux disease); Headache; Hyperlipidemia; Hypertension; Obesity; and Renal dialysis device, implant, or graft complication.  has a past surgical history that includes Abdominal hysterectomy; Dialysis fistula creation; Colonoscopy (2011); Cardiac catheterization (N/A, 05/10/2015); Cardiac catheterization (Left, 05/10/2015); Cardiac catheterization (Left, 08/23/2015); Cardiac catheterization (N/A, 08/23/2015); Cardiac catheterization (08/23/2015); Colonoscopy with propofol (N/A, 09/20/2015); Revison of arteriovenous fistula (Left, 10/28/2015); Wound debridement (Left, 10/28/2015); Cardiac catheterization (N/A, 01/03/2016); Esophagogastroduodenoscopy (egd) with propofol (N/A, 06/02/2016); and Esophagogastroduodenoscopy (N/A, 07/13/2016). Prior to Admission medications   Medication Sig Start Date End Date Taking? Authorizing Provider  albuterol (PROVENTIL HFA;VENTOLIN HFA) 108 (90 BASE) MCG/ACT inhaler Inhale 2 puffs into the lungs every 6 (six) hours as needed for wheezing or shortness of breath.   Yes Historical Provider, MD  ALPRAZolam Duanne Moron) 0.5 MG tablet Take 0.5 mg by mouth 2 (two) times daily.   Yes Historical Provider, MD  amLODipine (NORVASC) 10 MG tablet Take 10 mg by mouth daily.    Yes Historical Provider, MD  calcium acetate (PHOSLO) 667 MG capsule Take 1,334 mg by mouth 3 (three) times daily.    Yes Historical Provider, MD  carvedilol (COREG) 12.5 MG tablet Take 12.5 mg by mouth 2 (two) times daily with a meal.   Yes Historical Provider, MD  cinacalcet (SENSIPAR) 30 MG tablet Take 60 mg by mouth daily with breakfast.    Yes Historical Provider, MD  ezetimibe (ZETIA) 10 MG tablet Take 10 mg by mouth daily.  Yes Historical Provider, MD  fluticasone (FLONASE) 50 MCG/ACT nasal spray Place 2 sprays  into both nostrils daily as needed for rhinitis.   Yes Historical Provider, MD  furosemide (LASIX) 40 MG tablet Take 40 mg by mouth every Tuesday, Thursday, Saturday, and Sunday.    Yes Historical Provider, MD  lidocaine-prilocaine (EMLA) cream Apply 1 application topically as needed (prior to accessing port).   Yes Historical Provider, MD  losartan (COZAAR) 100 MG tablet Take 100 mg by mouth daily.    Yes Historical Provider, MD  pantoprazole (PROTONIX) 40 MG tablet Take 40 mg by mouth 2 (two) times daily.   Yes Historical Provider, MD  feeding supplement, ENSURE ENLIVE, (ENSURE ENLIVE) LIQD Take 237 mLs by mouth 3 (three) times daily between meals. 10/11/16   [name], MD  multivitamin (RENA-VIT) TABS tablet Take 1 tablet by mouth at bedtime.     Historical Provider, MD  omeprazole (PRILOSEC) 20 MG capsule Take 20 mg by mouth daily.    Historical Provider, MD  sertraline (ZOLOFT) 50 MG tablet Take 50 mg by mouth daily.     Historical Provider, MD   Allergies  Allergen Reactions  . Chantix [Varenicline]     hallucinations  . Sulfa Antibiotics Itching, Swelling, Rash and Other (See Comments)    Reaction:  Facial/body swelling     FAMILY HISTORY:  family history includes Cancer in her father and sister; Heart disease in her mother. SOCIAL HISTORY:  reports that she has been smoking Cigarettes.  She has a 20.00 pack-year smoking history. She has never used smokeless tobacco. She reports that she does not drink alcohol or use drugs.  REVIEW OF SYSTEMS: Positives in BOLD  Constitutional: Negative for fever, chills, weight loss, malaise/fatigue and diaphoresis.  HENT: Negative for hearing loss, ear pain, nosebleeds, congestion, sore throat, neck pain, tinnitus and ear discharge.   Eyes: Negative for blurred vision, double vision, photophobia, pain, discharge and redness.  Respiratory: Negative for cough, hemoptysis, sputum production, shortness of breath, wheezing and stridor.     Cardiovascular: right sided rib pain, chest pain, palpitations, orthopnea, claudication, leg swelling and PND.  Gastrointestinal: Negative for heartburn, nausea, vomiting, abdominal pain, diarrhea, constipation, blood in stool and melena.  Genitourinary: Negative for dysuria, urgency, frequency, hematuria and flank pain.  Musculoskeletal: Negative for myalgias, back pain, joint pain and falls.  Skin: Negative for itching and rash.  Neurological: Negative for dizziness, tingling, tremors, sensory change, speech change, focal weakness, seizures, loss of consciousness, weakness and headaches.  Endo/Heme/Allergies: Negative for environmental allergies and polydipsia. Does not bruise/bleed easily.  SUBJECTIVE: Pt currently c/o right sided flank pain worse with coughing   VITAL SIGNS: Temp:  [97.2 F (36.2 C)-98.6 F (37 C)] 98.5 F (36.9 C) (01/10 0800) Pulse Rate:  [56-73] 66 (01/10 0900) Resp:  [15-29] 19 (01/10 0900) BP: (105-178)/(51-97) 151/83 (01/10 1103) SpO2:  [84 %-100 %] 96 % (01/10 0900) FiO2 (%):  [35 %] 35 % (01/09 1400) Weight:  [56 kg (123 lb 7.3 oz)] 56 kg (123 lb 7.3 oz) (01/09 1240)  PHYSICAL EXAMINATION: General: chronically ill appearing AA female, well developed, well nourished  Neuro:  Somnolent, follows commands, PERRLA  HEENT:  Supple, no JVD Cardiovascular: NSR, s1s2, rrr, no M/R/G Lungs:  Diminished throughout, even, non labored on Bipap Abdomen:  +BS x, soft, non tender, non distened Musculoskeletal:  Normal bulk and tone no edema Skin:  Intact no rashes or lesions   Recent Labs Lab 12/04/16 0354  NA 140  K 5.4*  CL 98*  CO2 32  BUN 32*  CREATININE 6.67*  GLUCOSE 112*    Recent Labs Lab 12/04/16 0310  HGB 14.2  HCT 43.1  WBC 8.4  PLT 298   Dg Chest 2 View  Result Date: 12/04/2016 CLINICAL DATA:  Patient fell tonight. Right rib pain. Bruising to the right lateral and posterior ribs. History of hypertension. EXAM: CHEST  2 VIEW COMPARISON:   10/29/2016 FINDINGS: Shallow inspiration. Cardiac enlargement without vascular congestion. Focal infiltration in the right lung base posteriorly could represent atelectasis, contusion, or pneumonia. This is new since previous study. Displaced right posterior rib fractures seen best on the lateral view. No blunting of costophrenic angles. No pneumothorax. Calcified and tortuous aorta. IMPRESSION: Displaced right posterolateral rib fractures. Infiltration or atelectasis in the right lung base posteriorly. Cardiac enlargement. Electronically Signed   By: Lucienne Capers M.D.   On: 12/04/2016 03:13   Ct Head Wo Contrast  Result Date: 12/04/2016 CLINICAL DATA:  Confusion EXAM: CT HEAD WITHOUT CONTRAST TECHNIQUE: Contiguous axial images were obtained from the base of the skull through the vertex without intravenous contrast. COMPARISON:  10/16/2016 FINDINGS: Brain: No evidence of acute infarction, hemorrhage, hydrocephalus, extra-axial collection or mass lesion/mass effect. Vascular: No hyperdense vessel or unexpected calcification. Skull: Normal. Negative for fracture or focal lesion. Sinuses/Orbits: No acute finding. Other: None. IMPRESSION: No acute abnormality noted. Electronically Signed   By: Inez Catalina M.D.   On: 12/04/2016 09:05   Ct Chest Wo Contrast  Result Date: 12/04/2016 CLINICAL DATA:  Fall with right rib fractures. Right rib pain. Initial encounter. EXAM: CT CHEST WITHOUT CONTRAST TECHNIQUE: Multidetector CT imaging of the chest was performed following the standard protocol without IV contrast. COMPARISON:  Chest radiographs 12/04/2016 and CT 01/06/2015 FINDINGS: The study is mildly degraded by motion artifact. Cardiovascular: Thoracic aortic and three-vessel coronary artery atherosclerosis. Mild ectasia of the ascending aorta which measures up to 3.8 cm in diameter, stable to slightly increased from prior. Cardiomegaly. No pericardial effusion. Mediastinum/Nodes: No enlarged axillary, mediastinal,  or hilar lymph nodes identified on this unenhanced study. Calcified thyroid nodules. Lungs/Pleura: No pleural effusion or pneumothorax. There is confluent opacity posteriorly in the basilar right lower lobe with evidence of volume loss. Mild dependent atelectasis is present in the left lower lobe. Scattered lung cysts are again noted. There is mild centrilobular emphysema. Scattered small bilateral lung nodules do not appear significantly changed from the prior CT within limitations of motion artifact on both examinations. The largest nodule in the left lung measures 4 x 3 mm in the apical upper lobe (series 3, image 20), while the largest on the right measures 3 x 3 mm in the anterior upper lobe (series 3, image 80). Upper Abdomen: Partially visualized bilateral renal atrophy with small low-density and hyperattenuating lesions in both upper poles, incompletely evaluated though similar in size to those demonstrated on a 05/24/2016 abdominal CT and likely reflecting cysts of varying complexity. Musculoskeletal: Posterior right eleventh and twelfth rib fractures demonstrate approximately 1 shaft width displacement. There is a nondisplaced fracture of the right posterolateral tenth rib. 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 IMPRESSION: 1. Displaced posterior right eleventh and twelfth rib fractures. Nondisplaced right tenth rib fracture. 2. Right greater than left lower lobe atelectasis. 3. Emphysema with unchanged small lung nodules. Electronically Signed   By: Logan Bores M.D.   On: 12/04/2016 14:31    ASSESSMENT / PLAN: Acute on chronic hypoxic hypercapnic respiratory failure  secondary to ?right lung contusion Acute encephalopathy secondary to hypercapnia  Hx: COPD, Bronchitis, Chronic Home O2 at 2L, Sleep Apnea, Obesity, and ESRD on hemodialysis  P: Prn Bipap to maintain O2 sats 88% to 92% or for dyspnea Continue bronchodilator therapy Repeat CXR in am  01/11 Prn ABG's Surgery consulted to determine need for right chest tube placement appreciate input  Nephrology consulted appreciate input Scheduled hemodialysis today 01/10 per nephrology recommendations  Avoid sedating medications Frequent reorientation  Lights on during the day   Marda Stalker, [place] Pager [phone] (please enter 7 digits) PCCM Consult Pager [phone] (please enter 7 digits)   PCCM ATTENDING ATTESTATION: I have evaluated patient with the APP [place], reviewed database in its entirety and discussed care plan in detail. In addition, this patient was discussed on multidisciplinary rounds. Agree with above findings, assessment and plan  Merton Border, MD PCCM service Mobile [phone] Pager [phone]

## 2016-12-05 NOTE — Evaluation (Signed)
Physical Therapy Evaluation Patient Details Name: Michele Nash MRN: [id number] DOB: 14-Dec-1955 Today's Date: 12/05/2016   History of Present Illness  pt is a pleasant 61 yo female admitted to hospital due to fall and found to have acute respiratory failure, fall resulted in multiple rib fracures on right side (posterior lateral: 6,7 and Post: 11,12). She has a history of falls, anemia, apnea, arthritis, bronchitis, chronic kidney disease, COPD, depression and HTN. Pt scheduled for dialysis later today 12/05/16  Clinical Impression  Pt was sleeping on BIPAP with 99% O2 upon entrance but was willing to participate in Pt. Nursing was consulted and placed patient on nasal cannula with 4L O2/min with O2 sat at 96%. Pt's overall strength and ROM are WFL, grossly 4+/5. She complained of pain in her R ribs as 8/10 but pain did not limit activity during treatment. She was able to transfer to edge of bed with PT supervision and required min guarding to obtain standing position. RW was utilized to improve standing balance.Pt was able to ambulate around the entire ICU (256ft) with min guarding. She was able to communicate entire time walking and did not display signs of pain or fatigue. O2 was monitored during ambulation and stayed above 91% with 4 L/min. Pt displays balance and respiratory deficits and is recommended she use a RW to prevent future falls at dc.Stair ambulation should also be assessed as she has 14 steps to enter her apartment. Pt will continue to benefit from skilled PT to improve balance and cardio pulmonary health.       Follow Up Recommendations Home health PT    Equipment Recommendations  Rolling walker with 5" wheels    Recommendations for Other Services       Precautions / Restrictions Precautions Precautions: Fall Restrictions Weight Bearing Restrictions: No      Mobility  Bed Mobility Overal bed mobility: Modified Independent             General bed mobility  comments: Pt able to obtain upright sitting posture with use of bedrail, displayed increase in pain with movement  Transfers Overall transfer level: Needs assistance Equipment used: Rolling walker (2 wheeled) Transfers: Sit to/from Stand Sit to Stand: Min guard         General transfer comment: pt able to stand w/o much difficulty, unsteady in stance requiring RW  Ambulation/Gait Ambulation/Gait assistance: Min guard;+2 safety/equipment Ambulation Distance (Feet): 200 Feet Assistive device: Rolling walker (2 wheeled) Gait Pattern/deviations: WFL(Within Functional Limits)     General Gait Details: Overall appeared WFL, O2 was monitored during ambulation and stayed at 91-92%, pt able to talk while walking  Stairs            Wheelchair Mobility    Modified Rankin (Stroke Patients Only)       Balance Overall balance assessment: Needs assistance Sitting-balance support: No upper extremity supported Sitting balance-Leahy Scale: Normal Sitting balance - Comments: pt able to maintain upright posture without assistance   Standing balance support: Bilateral upper extremity supported Standing balance-Leahy Scale: Good Standing balance comment: used RW for additional stability                              Pertinent Vitals/Pain Pain Assessment: 0-10 Pain Score: 8  Pain Location: R side - ribs Pain Descriptors / Indicators: Aching Pain Intervention(s): Monitored during session;Limited activity within patient's tolerance    Home Living Family/patient expects to be discharged to:: Private residence  Living Arrangements: Spouse/significant other Available Help at Discharge: Family Type of Home: Apartment Home Access: Stairs to enter Entrance Stairs-Rails: Chemical engineer of Steps: 14 Home Layout: One level Home Equipment: Cane - single point      Prior Function Level of Independence: Independent         Comments: requires O2 at  baseline 3L     Hand Dominance   Dominant Hand: Right    Extremity/Trunk Assessment   Upper Extremity Assessment Upper Extremity Assessment: Overall WFL for tasks assessed (grossly 4+/5)    Lower Extremity Assessment Lower Extremity Assessment: Overall WFL for tasks assessed (Grossly 4+/5)       Communication   Communication: No difficulties  Cognition Arousal/Alertness: Awake/alert Behavior During Therapy: WFL for tasks assessed/performed Overall Cognitive Status: Within Functional Limits for tasks assessed                      General Comments      Exercises General Exercises - Lower Extremity Ankle Circles/Pumps: AROM;Supine;15 reps;Right;Left (to increase LE blood flow) Heel Slides: AROM;Right;Left;10 reps;Supine (to improve functional strength for bed mobility) Straight Leg Raises: AROM;Right;Left;10 reps;Supine (to improve functional strength for bed mobility)   Assessment/Plan    PT Assessment Patient needs continued PT services  PT Problem List Decreased balance;Decreased mobility;Cardiopulmonary status limiting activity;Decreased knowledge of use of DME;Decreased activity tolerance          PT Treatment Interventions Gait training;Stair training;Functional mobility training;DME instruction;Therapeutic activities;Therapeutic exercise;Balance training;Patient/family education    PT Goals (Current goals can be found in the Care Plan section)  Acute Rehab PT Goals Patient Stated Goal: Return home PT Goal Formulation: With patient Time For Goal Achievement: 12/19/16 Potential to Achieve Goals: Good    Frequency Min 2X/week   Barriers to discharge Inaccessible home environment stairs     Co-evaluation               End of Session Equipment Utilized During Treatment: Gait belt;Oxygen Activity Tolerance: Patient tolerated treatment well Patient left: in chair;with call bell/phone within reach Nurse Communication: Mobility status          Time: 1510-1547 PT Time Calculation (min) (ACUTE ONLY): 37 min   Charges:   PT Evaluation $PT Eval Moderate Complexity: 1 Procedure PT Treatments $Therapeutic Exercise: 8-22 mins   PT G Codes:        [employer] Student PT 12/05/2016, 4:41 PM

## 2016-12-05 NOTE — Progress Notes (Signed)
Start of hd 

## 2016-12-05 NOTE — Progress Notes (Signed)
[facility] [place] NAME: Michele Nash    MR#:  [id number]  DATE OF BIRTH:  Feb 08, 1956  SUBJECTIVE:  CHIEF COMPLAINT:   Chief Complaint  Patient presents with  . Fall  . Rib Injury   Admitted after a fall with right chest pain. Found to have multiple posterior right rib fractures. On BiPAP.  REVIEW OF SYSTEMS:    Review of Systems  Unable to perform ROS: Mental status change    DRUG ALLERGIES:   Allergies  Allergen Reactions  . Chantix [Varenicline]     hallucinations  . Sulfa Antibiotics Itching, Swelling, Rash and Other (See Comments)    Reaction:  Facial/body swelling     VITALS:  Blood pressure 133/78, pulse 66, temperature 98.5 F (36.9 C), temperature source Oral, resp. rate 19, height 5\' 5"  (1.651 m), weight 56 kg (123 lb 7.3 oz), SpO2 96 %.  PHYSICAL EXAMINATION:   Physical Exam  GENERAL:  61 y.o.-year-old patient lying in the bed, BiPAP EYES: Pupils equal, round, reactive to light and accommodation. No scleral icterus. Extraocular muscles intact.  HEENT: Head atraumatic, normocephalic. Oropharynx and nasopharynx clear.  NECK:  Supple, no jugular venous distention. No thyroid enlargement, no tenderness.  LUNGS: Poor air entry bilaterally CARDIOVASCULAR: S1, S2 normal. No murmurs, rubs, or gallops.  ABDOMEN: Soft, nontender, nondistended. Bowel sounds present. No organomegaly or mass.  EXTREMITIES: No cyanosis, clubbing or edema b/l.    NEUROLOGIC: Cranial nerves II through XII are intact. No focal Motor or sensory deficits b/l.   PSYCHIATRIC: The patient is drowsy SKIN: No obvious rash, lesion, or ulcer.   LABORATORY PANEL:   CBC  Recent Labs Lab 12/04/16 0310  WBC 8.4  HGB 14.2  HCT 43.1  PLT 298   ------------------------------------------------------------------------------------------------------------------ Chemistries   Recent Labs Lab 12/04/16 0354  NA 140  K 5.4*  CL 98*  CO2 32   GLUCOSE 112*  BUN 32*  CREATININE 6.67*  CALCIUM 8.7*  AST 31  ALT 22  ALKPHOS 222*  BILITOT 0.6   ------------------------------------------------------------------------------------------------------------------  Cardiac Enzymes No results for input(s): TROPONINI in the last 168 hours. ------------------------------------------------------------------------------------------------------------------  RADIOLOGY:  Dg Chest 2 View  Result Date: 12/04/2016 CLINICAL DATA:  Patient fell tonight. Right rib pain. Bruising to the right lateral and posterior ribs. History of hypertension. EXAM: CHEST  2 VIEW COMPARISON:  10/29/2016 FINDINGS: Shallow inspiration. Cardiac enlargement without vascular congestion. Focal infiltration in the right lung base posteriorly could represent atelectasis, contusion, or pneumonia. This is new since previous study. Displaced right posterior rib fractures seen best on the lateral view. No blunting of costophrenic angles. No pneumothorax. Calcified and tortuous aorta. IMPRESSION: Displaced right posterolateral rib fractures. Infiltration or atelectasis in the right lung base posteriorly. Cardiac enlargement. Electronically Signed   By: Lucienne Capers M.D.   On: 12/04/2016 03:13   Ct Head Wo Contrast  Result Date: 12/04/2016 CLINICAL DATA:  Confusion EXAM: CT HEAD WITHOUT CONTRAST TECHNIQUE: Contiguous axial images were obtained from the base of the skull through the vertex without intravenous contrast. COMPARISON:  10/16/2016 FINDINGS: Brain: No evidence of acute infarction, hemorrhage, hydrocephalus, extra-axial collection or mass lesion/mass effect. Vascular: No hyperdense vessel or unexpected calcification. Skull: Normal. Negative for fracture or focal lesion. Sinuses/Orbits: No acute finding. Other: None. IMPRESSION: No acute abnormality noted. Electronically Signed   By: Inez Catalina M.D.   On: 12/04/2016 09:05   Ct Chest Wo Contrast  Result Date:  12/04/2016 CLINICAL DATA:  Fall with right rib fractures. Right rib pain. Initial encounter. EXAM: CT CHEST WITHOUT CONTRAST TECHNIQUE: Multidetector CT imaging of the chest was performed following the standard protocol without IV contrast. COMPARISON:  Chest radiographs 12/04/2016 and CT 01/06/2015 FINDINGS: The study is mildly degraded by motion artifact. Cardiovascular: Thoracic aortic and three-vessel coronary artery atherosclerosis. Mild ectasia of the ascending aorta which measures up to 3.8 cm in diameter, stable to slightly increased from prior. Cardiomegaly. No pericardial effusion. Mediastinum/Nodes: No enlarged axillary, mediastinal, or hilar lymph nodes identified on this unenhanced study. Calcified thyroid nodules. Lungs/Pleura: No pleural effusion or pneumothorax. There is confluent opacity posteriorly in the basilar right lower lobe with evidence of volume loss. Mild dependent atelectasis is present in the left lower lobe. Scattered lung cysts are again noted. There is mild centrilobular emphysema. Scattered small bilateral lung nodules do not appear significantly changed from the prior CT within limitations of motion artifact on both examinations. The largest nodule in the left lung measures 4 x 3 mm in the apical upper lobe (series 3, image 20), while the largest on the right measures 3 x 3 mm in the anterior upper lobe (series 3, image 80). Upper Abdomen: Partially visualized bilateral renal atrophy with small low-density and hyperattenuating lesions in both upper poles, incompletely evaluated though similar in size to those demonstrated on a 05/24/2016 abdominal CT and likely reflecting cysts of varying complexity. Musculoskeletal: Posterior right eleventh and twelfth rib fractures demonstrate approximately 1 shaft width displacement. There is a nondisplaced fracture of the right posterolateral tenth rib. 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  IMPRESSION: 1. Displaced posterior right eleventh and twelfth rib fractures. Nondisplaced right tenth rib fracture. 2. Right greater than left lower lobe atelectasis. 3. Emphysema with unchanged small lung nodules. Electronically Signed   By: Logan Bores M.D.   On: 12/04/2016 14:31     ASSESSMENT AND PLAN:   * Acute on chronic respiratory failure due to rib fractures Needs BiPAP at this time. Repeat ABG. Patient is critically ill.  * Acute encephalopathy due to CO2 narcosis. BiPAP  * Right Posterolateral 10,11,12 ribs fracture Lung contusion or pneumothorax. Appreciate surgery input  * COPD.  Nebs and inhalers  * End-stage renal disease on hemodialysis with mild hyperkalemia. Consult nephrology for dialysis. Patient will get dialysis today  * DVT prophylaxis with heparin  All the records are reviewed and case discussed with Care Management/Social Workerr. Management plans discussed with the patient, family and they are in agreement.  CODE STATUS: FULL CODE  DVT Prophylaxis: SCDs  TOTAL TIME TAKING CARE OF THIS PATIENT: 35 minutes.   POSSIBLE D/C IN 2-3 DAYS, DEPENDING ON CLINICAL CONDITION.  Hillary Bow R M.D on 12/05/2016 at 10:28 AM  Between 7am to 6pm - Pager - [phone]  After 6pm go to [email or web address] - password [email or web address] [facility]  Office  [phone]  CC: Primary care physician; [name]  Note: This dictation was prepared with Dragon dictation along with smaller phrase technology. Any transcriptional errors that result from this process are unintentional.

## 2016-12-05 NOTE — Progress Notes (Signed)
  End of hd 

## 2016-12-05 NOTE — Progress Notes (Signed)
Pre hd assessment  

## 2016-12-05 NOTE — Progress Notes (Signed)
[facility]  ROUNDING NOTE   Subjective:  Patient presents today following a fall resulting in right distal lateral rib fractures at the 65 and posterior 11th 12th ribs. This is been consult and to resume patient's dialysis which began today. Patient follows with a Monday Wednesday Friday schedule.   Objective:  Vital signs in last 24 hours:  Temp:  [97.2 F (36.2 C)-98.6 F (37 C)] 98.5 F (36.9 C) (01/10 0800) Pulse Rate:  [56-73] 67 (01/10 0818) Resp:  [15-29] 23 (01/10 0818) BP: (105-178)/(51-97) 151/92 (01/10 0818) SpO2:  [84 %-100 %] 98 % (01/10 0818) FiO2 (%):  [35 %] 35 % (01/09 1400) Weight:  [56 kg (123 lb 7.3 oz)] 56 kg (123 lb 7.3 oz) (01/09 1240)  Weight change: -5.236 kg (-11 lb 8.7 oz) Filed Weights   12/04/16 0231 12/04/16 1240  Weight: 61.2 kg (135 lb) 56 kg (123 lb 7.3 oz)    Intake/Output: No intake/output data recorded.   Intake/Output this shift:  No intake/output data recorded.  Physical Exam: General: Currently on bipap  Head: Normocephalic, atraumatic. Moist oral mucosal membranes  Eyes: Anicteric  Neck: Supple, trachea midline  Lungs:  Basilar rales, normal effort  Heart: S1S2 no rubs  Abdomen:  Soft, nontener, bowel sounds present  Extremities: No peripheral edema.  Neurologic: Nonfocal, moving all four extremities  Skin: No lesions  Access: LUE AVF    Basic Metabolic Panel:  Recent Labs Lab 12/04/16 0354  NA 140  K 5.4*  CL 98*  CO2 32  GLUCOSE 112*  BUN 32*  CREATININE 6.67*  CALCIUM 8.7*    Liver Function Tests:  Recent Labs Lab 12/04/16 0354  AST 31  ALT 22  ALKPHOS 222*  BILITOT 0.6  PROT 7.9  ALBUMIN 3.9   No results for input(s): LIPASE, AMYLASE in the last 168 hours. No results for input(s): AMMONIA in the last 168 hours.  CBC:  Recent Labs Lab 12/04/16 0310  WBC 8.4  NEUTROABS 6.0  HGB 14.2  HCT 43.1  MCV 95.2  PLT 298    Cardiac Enzymes: No results for input(s): CKTOTAL, CKMB,  CKMBINDEX, TROPONINI in the last 168 hours.  BNP: Invalid input(s): POCBNP  CBG:  Recent Labs Lab 12/04/16 1241  GLUCAP 117*    Microbiology: Results for orders placed or performed during the hospital encounter of 12/04/16  Culture, blood (Routine x 2)     Status: None (Preliminary result)   Collection Time: 12/04/16  3:11 AM  Result Value Ref Range Status   Specimen Description BLOOD R WRIST  Final   Special Requests   Final    BOTTLES DRAWN AEROBIC AND ANAEROBIC AER 5ML ANA 2ML   Culture NO GROWTH < 12 HOURS  Final   Report Status PENDING  Incomplete  Culture, blood (Routine x 2)     Status: None (Preliminary result)   Collection Time: 12/04/16  3:37 AM  Result Value Ref Range Status   Specimen Description BLOOD R FA  Final   Special Requests   Final    BOTTLES DRAWN AEROBIC AND ANAEROBIC AER 8ML ANA 6ML   Culture NO GROWTH < 12 HOURS  Final   Report Status PENDING  Incomplete    Coagulation Studies:  Recent Labs  12/04/16 0337  LABPROT 13.8  INR 1.06    Urinalysis:  Recent Labs  12/04/16 0446  COLORURINE YELLOW*  LABSPEC 1.007  PHURINE 8.0  GLUCOSEU >=500*  HGBUR SMALL*  BILIRUBINUR NEGATIVE  KETONESUR NEGATIVE  PROTEINUR >=300*  NITRITE NEGATIVE  LEUKOCYTESUR MODERATE*      Imaging: Dg Chest 2 View  Result Date: 12/04/2016 CLINICAL DATA:  Patient fell tonight. Right rib pain. Bruising to the right lateral and posterior ribs. History of hypertension. EXAM: CHEST  2 VIEW COMPARISON:  10/29/2016 FINDINGS: Shallow inspiration. Cardiac enlargement without vascular congestion. Focal infiltration in the right lung base posteriorly could represent atelectasis, contusion, or pneumonia. This is new since previous study. Displaced right posterior rib fractures seen best on the lateral view. No blunting of costophrenic angles. No pneumothorax. Calcified and tortuous aorta. IMPRESSION: Displaced right posterolateral rib fractures. Infiltration or atelectasis in  the right lung base posteriorly. Cardiac enlargement. Electronically Signed   By: Lucienne Capers M.D.   On: 12/04/2016 03:13   Ct Head Wo Contrast  Result Date: 12/04/2016 CLINICAL DATA:  Confusion EXAM: CT HEAD WITHOUT CONTRAST TECHNIQUE: Contiguous axial images were obtained from the base of the skull through the vertex without intravenous contrast. COMPARISON:  10/16/2016 FINDINGS: Brain: No evidence of acute infarction, hemorrhage, hydrocephalus, extra-axial collection or mass lesion/mass effect. Vascular: No hyperdense vessel or unexpected calcification. Skull: Normal. Negative for fracture or focal lesion. Sinuses/Orbits: No acute finding. Other: None. IMPRESSION: No acute abnormality noted. Electronically Signed   By: Inez Catalina M.D.   On: 12/04/2016 09:05   Ct Chest Wo Contrast  Result Date: 12/04/2016 CLINICAL DATA:  Fall with right rib fractures. Right rib pain. Initial encounter. EXAM: CT CHEST WITHOUT CONTRAST TECHNIQUE: Multidetector CT imaging of the chest was performed following the standard protocol without IV contrast. COMPARISON:  Chest radiographs 12/04/2016 and CT 01/06/2015 FINDINGS: The study is mildly degraded by motion artifact. Cardiovascular: Thoracic aortic and three-vessel coronary artery atherosclerosis. Mild ectasia of the ascending aorta which measures up to 3.8 cm in diameter, stable to slightly increased from prior. Cardiomegaly. No pericardial effusion. Mediastinum/Nodes: No enlarged axillary, mediastinal, or hilar lymph nodes identified on this unenhanced study. Calcified thyroid nodules. Lungs/Pleura: No pleural effusion or pneumothorax. There is confluent opacity posteriorly in the basilar right lower lobe with evidence of volume loss. Mild dependent atelectasis is present in the left lower lobe. Scattered lung cysts are again noted. There is mild centrilobular emphysema. Scattered small bilateral lung nodules do not appear significantly changed from the prior CT within  limitations of motion artifact on both examinations. The largest nodule in the left lung measures 4 x 3 mm in the apical upper lobe (series 3, image 20), while the largest on the right measures 3 x 3 mm in the anterior upper lobe (series 3, image 80). Upper Abdomen: Partially visualized bilateral renal atrophy with small low-density and hyperattenuating lesions in both upper poles, incompletely evaluated though similar in size to those demonstrated on a 05/24/2016 abdominal CT and likely reflecting cysts of varying complexity. Musculoskeletal: Posterior right eleventh and twelfth rib fractures demonstrate approximately 1 shaft width displacement. There is a nondisplaced fracture of the right posterolateral tenth rib. 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 IMPRESSION: 1. Displaced posterior right eleventh and twelfth rib fractures. Nondisplaced right tenth rib fracture. 2. Right greater than left lower lobe atelectasis. 3. Emphysema with unchanged small lung nodules. Electronically Signed   By: Logan Bores M.D.   On: 12/04/2016 14:31     Medications:    . amLODipine  10 mg Oral Daily  . calcium acetate  1,334 mg Oral TID WC  . carvedilol  12.5 mg Oral BID WC  . cefTRIAXone  1 g Intravenous Q24H  . chlorhexidine  15 mL Mouth Rinse BID  . cinacalcet  60 mg Oral Q breakfast  . feeding supplement (ENSURE ENLIVE)  237 mL Oral TID BM  . furosemide  40 mg Oral Q T,Th,S,Su  . heparin  5,000 Units Subcutaneous Q8H  . ipratropium-albuterol  3 mL Nebulization Q4H  . losartan  100 mg Oral Daily  . mouth rinse  15 mL Mouth Rinse q12n4p  . pantoprazole  40 mg Oral BID  . sertraline  50 mg Oral Daily   acetaminophen **OR** acetaminophen, ALPRAZolam, bisacodyl, fluticasone, hydrALAZINE, ketorolac, lidocaine-prilocaine, ondansetron **OR** ondansetron (ZOFRAN) IV, senna-docusate, traMADol  Assessment/ Plan:  61 y.o. female with hypertension, hyperlipidemia,  diastolic heart failure, hypertension, COPD, tobacco abuse, AOCD, SHPTH, LUE AVF, ESRD 02/2013, cocaine abuse, hx of recurrent falls, readmission for fall/posterior right rib fracture 12/05/16  [facility]/MWF  1. ESRD on HD MWF. Patient is due for hemodialysis today. She states that she did go to dialysis on Monday. Dialysis orders have been prepared.  2. Hyperkalemia. Mild hyperkalemia noted. We will dialyze the patient against a 2K bath.  3. Anemia of chronic kidney disease. Hemoglobin actually quite high at 14.2. Therefore we will hold Epogen at this time.  4. Secondary hyperparathyroidism. We plan to check phosphorus with dialysis today.  5. Acute respiratory failure. Patient has elevated PCO2 of 63. She was lethargic earlier. She is currently on BiPAP. Continue this under the instruction of pulmonary/critical care.  6. Thanks for consultation.   LOS: 1 Karis Emig 1/10/20189:29 AM

## 2016-12-06 ENCOUNTER — Inpatient Hospital Stay: Payer: Medicare Other

## 2016-12-06 DIAGNOSIS — J441 Chronic obstructive pulmonary disease with (acute) exacerbation: Secondary | ICD-10-CM

## 2016-12-06 LAB — PARATHYROID HORMONE, INTACT (NO CA): PTH: 782 pg/mL — ABNORMAL HIGH (ref 15–65)

## 2016-12-06 LAB — HEPATITIS B SURFACE ANTIGEN: HEP B S AG: NEGATIVE

## 2016-12-06 MED ORDER — KETOROLAC TROMETHAMINE 30 MG/ML IJ SOLN
15.0000 mg | Freq: Four times a day (QID) | INTRAMUSCULAR | Status: DC | PRN
  Administered 2016-12-06 (×2): 15 mg via INTRAVENOUS
  Filled 2016-12-06 (×3): qty 1

## 2016-12-06 MED ORDER — KETOROLAC TROMETHAMINE 30 MG/ML IJ SOLN: 30.0000 mg | Freq: Four times a day (QID) | INTRAMUSCULAR | Status: DC | PRN

## 2016-12-06 NOTE — Progress Notes (Signed)
Pt in and out of sleep in recliner beside bed. C/O rib pain, Toradol given. Pt sat 95-100% on [place]. Called MD with update on pt condition, transfer orders placed, bed placement on 2A. Report called, pt transferred

## 2016-12-06 NOTE — Care Management Note (Addendum)
Case Management Note  Patient Details  Name: Michele Nash MRN: [id number] Date of Birth: September 18, 1956  Subjective/Objective:                  Met with patient to discuss discharge planning. She is in between places with her husband (problems in relationship-denies abuse) and her sister's house. Patient again refused home health services being arranged. Her PCP is Dr. Rosario Jacks. She [facility] already. She states she have been working on getting a bipap in the outpatient setting; she agrees for [facility] to assist. She has a hard time tolerating BiPap this visit- Dr. Lora Havens agrees with [facility]. Patient is independent with mobility and denies having problems getting to appointments or obtaining medications. HD MWF [facility] [place].   Action/Plan: Home health list left with patient in case she changes her mind along with this RNCM contact number. I have notified Corene Cornea with [facility] of [facility] need. Documentation and Rx needed:  Diagnosis of COPD and documentation of Chronic Respiratory Failure  Order specifying:  *Description of items with Mode (if Mode is "AVAP's Mode Inspiratory Pressure must be ordered for > 30 cmH20  or AVAP's must be ordered with PC /EA IPAP Max, IPAP Min, EPAP, Rate, Target Tidal Volume, O2 Bleed-in @  ____ lpm (if applicable) *Name of Physician *Name of Beneficiary *Start date of the order  Clinical documentation: Must meet one of the following:  ____PaCO2 > 44m Hg (taken during the day while not using sedatives) with > 2 hospitalizations in the past year and  documentation that the patient condition continued to decline while on a Respiratory Assist Device.  ____PaCO2 > 522mg (taken during the day while not using sedatives) with SaO2 with nocturnal desaturation < 88% for  5 accumulitive minutes on oxygen therapy of at least 2 lpm or the patients O2 lpm if > 2lpm with > 2 hospitalizations  in the past year and documentation that  patient condition continued to decline while on a Respiratory Assist Device.  ____Patient requires ventilatory support > 12 hours in a 24 hour period in which other Respiratory Support Devices are not  designed to operate these long periods of time, a back-up battery to support ventilation during periods of power loss, and  alarms to alert the caregiver should the patient become disconnected or develop a large leak in the circuit.  Documentation that without this type of extended ventilatory support serious harm or death could occur..    Expected Discharge Date:                  Expected Discharge Plan:     In-House Referral:     Discharge planning Services  CM Consult  Post Acute Care Choice:  Home Health, Durable Medical Equipment Choice offered to:  Patient  DME Arranged:  [facility] DME Agency:  [facility]   [facility]     Status of Service:  In process, will continue to follow  If discussed at Long Length of Stay Meetings, dates discussed:    Additional Comments:  AnMarshell GarfinkelRN 12/06/2016, 11:57 AM

## 2016-12-06 NOTE — Progress Notes (Signed)
Patient able to remained off bipap all day, and up in chair. Oxygen decreased to 2L. Patient able to work with PT today with no issues. Patient updated on plan of care. No complaints. Currently resting.  Wilnette Kales

## 2016-12-06 NOTE — Progress Notes (Signed)
Physical Therapy Treatment Patient Details Name: Michele Nash MRN: [id number] DOB: 11/18/1956 Today's Date: 12/06/2016    History of Present Illness pt is a pleasant 61 yo female admitted to hospital due to fall and found to have acute respiratory failure, fall resulted in multiple rib fracures on right side (posterior lateral: 6,7 and Post: 11,12). She has a history of falls, anemia, apnea, arthritis, bronchitis, chronic kidney disease, COPD, depression and HTN. Pt scheduled for dialysis later today 12/05/16    PT Comments    Nursing staff consulted and stated patient was stable for PT treatment. Pt was alert once awoken and eager to participate in treatment. O2 was 96% on 2l/min before treatment and stayed above 90% during treatment. Stated her pain was 7/10 on R side (ribs). Pt attempted gait w/o an AD but appeared unsteady, stability returned with use of RW indicating need for an AD upon dc. Pt performed standing exercises (min guard) but requires a walker to maintain balance during reaching activities, and single leg stance. She ascended and descended 4 steps using a railing for additional stability with min guard, she reported no increase in pain or RR during activity. Recommended pt receive RW upon discharge and she will continue to benefit from skilled PT to improve balance and prevent future falls.   Follow Up Recommendations  Home health PT     Equipment Recommendations  Rolling walker with 5" wheels    Recommendations for Other Services       Precautions / Restrictions Precautions Precautions: Fall Restrictions Weight Bearing Restrictions: No    Mobility  Bed Mobility Overal bed mobility: Modified Independent             General bed mobility comments: Pt able to obtain upright sitting posture with use of bedrail, displayed increase in pain with movement  Transfers Overall transfer level: Needs assistance (supervision) Equipment used: Rolling walker (2  wheeled) Transfers: Sit to/from Stand Sit to Stand: Min guard         General transfer comment: pt able to stand w/o much difficulty, unsteady in stance requiring RW  Ambulation/Gait Ambulation/Gait assistance: Min guard;+2 safety/equipment Ambulation Distance (Feet): 80 Feet Assistive device: Rolling walker (2 wheeled) Gait Pattern/deviations: WFL(Within Functional Limits)     General Gait Details: Overall appeared WFL, O2 was monitored during ambulation and stayed at 91-92%, pt able to talk while walking   Stairs Stairs: Yes   Stair Management: One rail Right Number of Stairs: 4 General stair comments: Able to ambulate safely, limited to 4 steps because of O2 cord  Min guarding during stairs  Wheelchair Mobility    Modified Rankin (Stroke Patients Only)       Balance Overall balance assessment: Needs assistance Sitting-balance support: No upper extremity supported Sitting balance-Leahy Scale: Normal Sitting balance - Comments: pt able to maintain upright posture without assistance   Standing balance support: Bilateral upper extremity supported Standing balance-Leahy Scale: Good Standing balance comment: used RW for additional stability, unsteady without AD,                High Level Balance Comments: pt unable to perfrom singl leg stance w/o AD     Cognition Arousal/Alertness: Awake/alert Behavior During Therapy: WFL for tasks assessed/performed Overall Cognitive Status: Within Functional Limits for tasks assessed                      Exercises Other Exercises Other Exercises: Standing therex - AROM 1x10 with RW - SLR, hip  abd, hip ext, heel raises to improve LE strength and dynamic balance w min guarding from PT    General Comments        Pertinent Vitals/Pain Pain Assessment: 0-10 Pain Score: 7  Pain Location: R side - ribs Pain Descriptors / Indicators: Aching Pain Intervention(s): Monitored during session;Limited activity within  patient's tolerance    Home Living                      Prior Function            PT Goals (current goals can now be found in the care plan section) Acute Rehab PT Goals Patient Stated Goal: Return home PT Goal Formulation: With patient Time For Goal Achievement: 12/19/16 Potential to Achieve Goals: Good Progress towards PT goals: Progressing toward goals    Frequency    Min 2X/week      PT Plan Current plan remains appropriate    Co-evaluation             End of Session Equipment Utilized During Treatment: Gait belt;Oxygen Activity Tolerance: Patient tolerated treatment well Patient left: in chair;with call bell/phone within reach;with family/visitor present     Time: 1200-1223 PT Time Calculation (min) (ACUTE ONLY): 23 min  Charges:  $Gait Training: 8-22 mins $Therapeutic Exercise: 8-22 mins                    G Codes:      Michele Nash 2016-12-18, 3:23 PM

## 2016-12-06 NOTE — Progress Notes (Signed)
[facility]  ROUNDING NOTE   Subjective:  Patient seen at bedside. She is off of BiPAP this a.m. She completed hemodialysis yesterday without issue.   Objective:  Vital signs in last 24 hours:  Temp:  [97.6 F (36.4 C)-99.4 F (37.4 C)] 99.4 F (37.4 C) (01/11 0800) Pulse Rate:  [61-86] 81 (01/11 0800) Resp:  [14-29] 21 (01/11 0800) BP: (89-171)/(49-110) 164/82 (01/11 0800) SpO2:  [88 %-100 %] 96 % (01/11 0800) FiO2 (%):  [35 %] 35 % (01/10 2332)  Weight change:  Filed Weights   12/04/16 0231 12/04/16 1240  Weight: 61.2 kg (135 lb) 56 kg (123 lb 7.3 oz)    Intake/Output: I/O last 3 completed shifts: In: 80 [IV Piggyback:50] Out: 2000 [Other:2000]   Intake/Output this shift:  No intake/output data recorded.  Physical Exam: General: No acute distress   Head: Normocephalic, atraumatic. Moist oral mucosal membranes  Eyes: Anicteric  Neck: Supple, trachea midline  Lungs:  Basilar rales, normal effort  Heart: S1S2 no rubs  Abdomen:  Soft, nontener, bowel sounds present  Extremities: No peripheral edema.  Neurologic: Nonfocal, moving all four extremities  Skin: No lesions  Access: LUE AVF    Basic Metabolic Panel:  Recent Labs Lab 12/04/16 0354 12/05/16 1526  NA 140  --   K 5.4*  --   CL 98*  --   CO2 32  --   GLUCOSE 112*  --   BUN 32*  --   CREATININE 6.67*  --   CALCIUM 8.7*  --   PHOS  --  6.5*    Liver Function Tests:  Recent Labs Lab 12/04/16 0354  AST 31  ALT 22  ALKPHOS 222*  BILITOT 0.6  PROT 7.9  ALBUMIN 3.9   No results for input(s): LIPASE, AMYLASE in the last 168 hours. No results for input(s): AMMONIA in the last 168 hours.  CBC:  Recent Labs Lab 12/04/16 0310  WBC 8.4  NEUTROABS 6.0  HGB 14.2  HCT 43.1  MCV 95.2  PLT 298    Cardiac Enzymes: No results for input(s): CKTOTAL, CKMB, CKMBINDEX, TROPONINI in the last 168 hours.  BNP: Invalid input(s): POCBNP  CBG:  Recent Labs Lab 12/04/16 1241   GLUCAP 117*    Microbiology: Results for orders placed or performed during the hospital encounter of 12/04/16  Culture, blood (Routine x 2)     Status: None (Preliminary result)   Collection Time: 12/04/16  3:11 AM  Result Value Ref Range Status   Specimen Description BLOOD R WRIST  Final   Special Requests   Final    BOTTLES DRAWN AEROBIC AND ANAEROBIC AER 5ML ANA 2ML   Culture NO GROWTH 2 DAYS  Final   Report Status PENDING  Incomplete  Culture, blood (Routine x 2)     Status: None (Preliminary result)   Collection Time: 12/04/16  3:37 AM  Result Value Ref Range Status   Specimen Description BLOOD R FA  Final   Special Requests   Final    BOTTLES DRAWN AEROBIC AND ANAEROBIC AER 8ML ANA 6ML   Culture NO GROWTH 2 DAYS  Final   Report Status PENDING  Incomplete  Urine culture     Status: None   Collection Time: 12/04/16  4:46 AM  Result Value Ref Range Status   Specimen Description URINE, RANDOM  Final   Special Requests NONE  Final   Culture NO GROWTH Performed at [facility]   Final   Report Status  12/05/2016 FINAL  Final    Coagulation Studies:  Recent Labs  12/04/16 0337  LABPROT 13.8  INR 1.06    Urinalysis:  Recent Labs  12/04/16 0446  COLORURINE YELLOW*  LABSPEC 1.007  PHURINE 8.0  GLUCOSEU >=500*  HGBUR SMALL*  BILIRUBINUR NEGATIVE  KETONESUR NEGATIVE  PROTEINUR >=300*  NITRITE NEGATIVE  LEUKOCYTESUR MODERATE*      Imaging: Ct Chest Wo Contrast  Result Date: 12/04/2016 CLINICAL DATA:  Fall with right rib fractures. Right rib pain. Initial encounter. EXAM: CT CHEST WITHOUT CONTRAST TECHNIQUE: Multidetector CT imaging of the chest was performed following the standard protocol without IV contrast. COMPARISON:  Chest radiographs 12/04/2016 and CT 01/06/2015 FINDINGS: The study is mildly degraded by motion artifact. Cardiovascular: Thoracic aortic and three-vessel coronary artery atherosclerosis. Mild ectasia of the ascending aorta which  measures up to 3.8 cm in diameter, stable to slightly increased from prior. Cardiomegaly. No pericardial effusion. Mediastinum/Nodes: No enlarged axillary, mediastinal, or hilar lymph nodes identified on this unenhanced study. Calcified thyroid nodules. Lungs/Pleura: No pleural effusion or pneumothorax. There is confluent opacity posteriorly in the basilar right lower lobe with evidence of volume loss. Mild dependent atelectasis is present in the left lower lobe. Scattered lung cysts are again noted. There is mild centrilobular emphysema. Scattered small bilateral lung nodules do not appear significantly changed from the prior CT within limitations of motion artifact on both examinations. The largest nodule in the left lung measures 4 x 3 mm in the apical upper lobe (series 3, image 20), while the largest on the right measures 3 x 3 mm in the anterior upper lobe (series 3, image 80). Upper Abdomen: Partially visualized bilateral renal atrophy with small low-density and hyperattenuating lesions in both upper poles, incompletely evaluated though similar in size to those demonstrated on a 05/24/2016 abdominal CT and likely reflecting cysts of varying complexity. Musculoskeletal: Posterior right eleventh and twelfth rib fractures demonstrate approximately 1 shaft width displacement. There is a nondisplaced fracture of the right posterolateral tenth rib. 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 IMPRESSION: 1. Displaced posterior right eleventh and twelfth rib fractures. Nondisplaced right tenth rib fracture. 2. Right greater than left lower lobe atelectasis. 3. Emphysema with unchanged small lung nodules. Electronically Signed   By: Logan Bores M.D.   On: 12/04/2016 14:31   Dg Chest Port 1 View  Result Date: 12/06/2016 CLINICAL DATA:  Acute respiratory failure EXAM: PORTABLE CHEST 1 VIEW COMPARISON:  12/05/2016 FINDINGS: Cardiac shadow is enlarged. The aorta is again  tortuous in appearance. Increasing right basilar atelectasis with volume loss is noted. The left lung remains clear. No bony abnormality is seen. IMPRESSION: Increasing right basilar atelectasis. Electronically Signed   By: Inez Catalina M.D.   On: 12/06/2016 07:14   Dg Chest Port 1 View  Result Date: 12/05/2016 CLINICAL DATA:  Evaluation acute respiratory failure. EXAM: PORTABLE CHEST 1 VIEW COMPARISON:  12/04/2016 FINDINGS: Stable enlarged cardiac silhouette. No effusion, infiltrate pneumothorax. Interval decreased interstitial edema better seen on prior. IMPRESSION: 1. Persistent cardiomegaly. 2. Mild improvement in interstitial edema pattern. Electronically Signed   By: Suzy Bouchard M.D.   On: 12/05/2016 12:08     Medications:    . amLODipine  10 mg Oral Daily  . calcium acetate  1,334 mg Oral TID WC  . carvedilol  12.5 mg Oral BID WC  . cefTRIAXone  1 g Intravenous Q24H  . chlorhexidine  15 mL Mouth Rinse BID  . cinacalcet  60  mg Oral Q breakfast  . feeding supplement (ENSURE ENLIVE)  237 mL Oral TID BM  . furosemide  40 mg Oral Q T,Th,S,Su  . heparin  5,000 Units Subcutaneous Q8H  . ipratropium-albuterol  3 mL Nebulization Q4H  . losartan  100 mg Oral Daily  . mouth rinse  15 mL Mouth Rinse q12n4p  . pantoprazole  40 mg Oral BID  . sertraline  50 mg Oral Daily   sodium chloride, sodium chloride, acetaminophen **OR** acetaminophen, ALPRAZolam, alteplase, bisacodyl, fluticasone, heparin, hydrALAZINE, ketorolac, lidocaine (PF), lidocaine-prilocaine, lidocaine-prilocaine, ondansetron **OR** ondansetron (ZOFRAN) IV, pentafluoroprop-tetrafluoroeth, senna-docusate, traMADol  Assessment/ Plan:  61 y.o. female with hypertension, hyperlipidemia, diastolic heart failure, hypertension, COPD, tobacco abuse, AOCD, SHPTH, LUE AVF, ESRD 02/2013, cocaine abuse, hx of recurrent falls, readmission for fall/posterior right rib fracture 12/05/16  [facility]/MWF  1. ESRD on HD MWF. Patient  completed hemodialysis yesterday without incident. We will plan for hemodialysis again tomorrow. No urgent indication for dialysis at the moment.  2. Hyperkalemia. No new potassium this a.m. Patient did have dialysis yesterday with a 2K bath.  3. Anemia of chronic kidney disease. Continue to hold Epogen for high hemoglobin of 14.2.  4. Secondary hyperparathyroidism. Phosphorus high at 6.5.  Continue PhosLo 2 tablets by mouth 3 times a day with meals and follow-up phosphorus tomorrow.  5. Acute respiratory failure. Patient significantly improved. Now off of BiPAP. Continue to monitor respiratory status.   LOS: 2 [place] Raggio 1/11/20189:49 AM

## 2016-12-06 NOTE — Progress Notes (Signed)
Name: Michele Nash MRN: [id number] DOB: [birth year]    ADMISSION DATE:  12/04/2016 CONSULTATION DATE: 12/05/2016  REFERRING MD :  Dr. Darvin Neighbours  CHIEF COMPLAINT:  Fall and Rib Injury  BRIEF PATIENT DESCRIPTION: This is a 61 yo female who presented to [facility] ER 01/9 post fall at home and found to have multiple posterior right rib fractures in acute on chronic hypoxic hypercapnic respiratory failure secondary to ?right lung contusion  SIGNIFICANT EVENTS  01/9-Pt admitted to [facility] ICU post fall at home in ER found to have multiple posterior right rib fractures and in acute respiratory failure 01/10-PCCM consulted for acute on chronic hypoxic hypercapnic respiratory failure secondary to ?right lung contusion requiring Bipap   STUDIES:  CT Chest 01/9>>Displaced posterior right eleventh and twelfth rib fractures. Nondisplaced right tenth rib fracture. Right greater than left lower lobe atelectasis. Emphysema with unchanged small lung nodules. CT Head 01/9>>No acute abnormality noted  HISTORY OF PRESENT ILLNESS:   This is a 61 yo female with a PMH of ESRD on Hemodialysis, Obesity, Hypertension, Hyperlipidemia, GERD, Depression, COPD, Home O2 at 2L via nasal canula, Bronchitis, Arthritis, Sleep Apnea, and Anemia.  She presented to [facility] ER on 01/9 following an unwitnessed fall at home with c/o right sided rib pain after falling on the edge of a small table.  In the ER the pt was somnolent but arousable and hypoxic with O2 sats 70-80% on 4L O2 via nasal canula.  A CT Chest on 01/9 revealed the pt had displaced posterior right eleventh and twelfth rib fractures and nondisplaced right tenth rib fracture.  She was admitted to ICU due to acute on chronic hypoxic hypercapnic respiratory failure secondary to ?right lung contusion due to multiple rib fractures following a fall.  PCCM consulted 01/10 due to acute encephalopathy secondary to hypercapnia requiring intermittent Bipap.    PAST MEDICAL HISTORY :   has  a past medical history of Anemia; Apnea, sleep; Arthritis; Bronchitis; Chronic kidney disease; COPD (chronic obstructive pulmonary disease) ([place]); Depression; Dialysis patient [facility]) (2014); GERD (gastroesophageal reflux disease); Headache; Hyperlipidemia; Hypertension; Obesity; and Renal dialysis device, implant, or graft complication.  has a past surgical history that includes Abdominal hysterectomy; Dialysis fistula creation; Colonoscopy (2011); Cardiac catheterization (N/A, 05/10/2015); Cardiac catheterization (Left, 05/10/2015); Cardiac catheterization (Left, 08/23/2015); Cardiac catheterization (N/A, 08/23/2015); Cardiac catheterization (08/23/2015); Colonoscopy with propofol (N/A, 09/20/2015); Revison of arteriovenous fistula (Left, 10/28/2015); Wound debridement (Left, 10/28/2015); Cardiac catheterization (N/A, 01/03/2016); Esophagogastroduodenoscopy (egd) with propofol (N/A, 06/02/2016); and Esophagogastroduodenoscopy (N/A, 07/13/2016).   REVIEW OF SYSTEMS: Positives in BOLD  Constitutional: Negative for fever, chills, weight loss, malaise/fatigue and diaphoresis.  HENT: Negative for hearing loss, ear pain, nosebleeds, congestion, sore throat, neck pain, tinnitus and ear discharge.   Eyes: Negative for blurred vision, double vision, photophobia, pain, discharge and redness.  Respiratory: Negative for cough, hemoptysis, sputum production, shortness of breath, wheezing and stridor.   Cardiovascular: right sided rib pain, chest pain, palpitations, orthopnea, claudication, leg swelling and PND.  Gastrointestinal: Negative for heartburn, nausea, vomiting, abdominal pain, diarrhea, constipation, blood in stool and melena.  Genitourinary: Negative for dysuria, urgency, frequency, hematuria and flank pain.  Musculoskeletal: Negative for myalgias, back pain, joint pain and falls.  Skin: Negative for itching and rash.  Neurological: Negative for dizziness, tingling, tremors, sensory change, speech change, focal  weakness, seizures, loss of consciousness, weakness and headaches.  Endo/Heme/Allergies: Negative for environmental allergies and polydipsia. Does not bruise/bleed easily.  SUBJECTIVE: Pt currently in chair no complaints at this time in  no acute distress   VITAL SIGNS: Temp:  [97.6 F (36.4 C)-99.4 F (37.4 C)] 98.9 F (37.2 C) (01/11 1200) Pulse Rate:  [61-86] 73 (01/11 1130) Resp:  [14-29] 15 (01/11 1200) BP: (89-171)/(49-110) 134/92 (01/11 1200) SpO2:  [88 %-100 %] 96 % (01/11 1130) FiO2 (%):  [35 %] 35 % (01/10 2332)  PHYSICAL EXAMINATION: General: AA female, well developed, well nourished  Neuro:  Alert and oriented, follows commands, PERRLA  HEENT:  Supple, no JVD Cardiovascular: NSR, s1s2, rrr, no M/R/G Lungs:  Diminished throughout, even, non labored on Bipap Abdomen:  +BS x, soft, non tender, non distended Musculoskeletal:  Normal bulk and tone no edema Skin:  Intact no rashes or lesions   Recent Labs Lab 12/04/16 0354  NA 140  K 5.4*  CL 98*  CO2 32  BUN 32*  CREATININE 6.67*  GLUCOSE 112*    Recent Labs Lab 12/04/16 0310  HGB 14.2  HCT 43.1  WBC 8.4  PLT 298   Ct Chest Wo Contrast  Result Date: 12/04/2016 CLINICAL DATA:  Fall with right rib fractures. Right rib pain. Initial encounter. EXAM: CT CHEST WITHOUT CONTRAST TECHNIQUE: Multidetector CT imaging of the chest was performed following the standard protocol without IV contrast. COMPARISON:  Chest radiographs 12/04/2016 and CT 01/06/2015 FINDINGS: The study is mildly degraded by motion artifact. Cardiovascular: Thoracic aortic and three-vessel coronary artery atherosclerosis. Mild ectasia of the ascending aorta which measures up to 3.8 cm in diameter, stable to slightly increased from prior. Cardiomegaly. No pericardial effusion. Mediastinum/Nodes: No enlarged axillary, mediastinal, or hilar lymph nodes identified on this unenhanced study. Calcified thyroid nodules. Lungs/Pleura: No pleural effusion or  pneumothorax. There is confluent opacity posteriorly in the basilar right lower lobe with evidence of volume loss. Mild dependent atelectasis is present in the left lower lobe. Scattered lung cysts are again noted. There is mild centrilobular emphysema. Scattered small bilateral lung nodules do not appear significantly changed from the prior CT within limitations of motion artifact on both examinations. The largest nodule in the left lung measures 4 x 3 mm in the apical upper lobe (series 3, image 20), while the largest on the right measures 3 x 3 mm in the anterior upper lobe (series 3, image 80). Upper Abdomen: Partially visualized bilateral renal atrophy with small low-density and hyperattenuating lesions in both upper poles, incompletely evaluated though similar in size to those demonstrated on a 05/24/2016 abdominal CT and likely reflecting cysts of varying complexity. Musculoskeletal: Posterior right eleventh and twelfth rib fractures demonstrate approximately 1 shaft width displacement. There is a nondisplaced fracture of the right posterolateral tenth rib. 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 IMPRESSION: 1. Displaced posterior right eleventh and twelfth rib fractures. Nondisplaced right tenth rib fracture. 2. Right greater than left lower lobe atelectasis. 3. Emphysema with unchanged small lung nodules. Electronically Signed   By: Logan Bores M.D.   On: 12/04/2016 14:31   Dg Chest Port 1 View  Result Date: 12/06/2016 CLINICAL DATA:  Acute respiratory failure EXAM: PORTABLE CHEST 1 VIEW COMPARISON:  12/05/2016 FINDINGS: Cardiac shadow is enlarged. The aorta is again tortuous in appearance. Increasing right basilar atelectasis with volume loss is noted. The left lung remains clear. No bony abnormality is seen. IMPRESSION: Increasing right basilar atelectasis. Electronically Signed   By: Inez Catalina M.D.   On: 12/06/2016 07:14   Dg Chest Port 1  View  Result Date: 12/05/2016 CLINICAL DATA:  Evaluation acute respiratory failure. EXAM:  PORTABLE CHEST 1 VIEW COMPARISON:  12/04/2016 FINDINGS: Stable enlarged cardiac silhouette. No effusion, infiltrate pneumothorax. Interval decreased interstitial edema better seen on prior. IMPRESSION: 1. Persistent cardiomegaly. 2. Mild improvement in interstitial edema pattern. Electronically Signed   By: Suzy Bouchard M.D.   On: 12/05/2016 12:08    ASSESSMENT / PLAN: Acute on chronic hypoxic hypercapnic respiratory failure secondary to ?right lung contusion Right rib fracture Acute encephalopathy secondary to hypercapnia-improving  Hx: COPD, Bronchitis, Chronic Home O2 at 2L, Sleep Apnea, Obesity, and ESRD on hemodialysis  P: Prn Bipap to maintain O2 sats 88% to 92% or for dyspnea Continue bronchodilator therapy Intermittent CXR Prn ABG's Per surgery no need for surgical intervention or chest tube placement no pneumothorax Hemodialysis per nephrology recommendations  Avoid sedating medications Prn toradol and tylenol for pain management  Frequent reorientation  Lights on during the day  Early ambulation Pulmonary hygiene  Marda Stalker, [place] Pager [phone] (please enter 7 digits) PCCM Consult Pager [phone] (please enter 7 digits)  PCCM ATTENDING ATTESTATION:  I have evaluated patient with the APP [place], reviewed database in its entirety and discussed care plan in detail. In addition, this patient was discussed on multidisciplinary rounds. Agree with the above documentation. The care plan was supervised by me   Merton Border, MD PCCM service Mobile [phone] Pager [phone] 12/06/2016

## 2016-12-06 NOTE — Progress Notes (Signed)
[facility] [place] NAME: Michele Nash    MR#:  [id number]  DATE OF BIRTH:  08-05-1956  SUBJECTIVE:  CHIEF COMPLAINT:   Chief Complaint  Patient presents with  . Fall  . Rib Injury   Admitted after a fall with right chest pain. Found to have multiple posterior right rib fractures. Drowzy today. On 3 L o2  REVIEW OF SYSTEMS:    Review of Systems  Unable to perform ROS: Mental status change    DRUG ALLERGIES:   Allergies  Allergen Reactions  . Chantix [Varenicline]     hallucinations  . Sulfa Antibiotics Itching, Swelling, Rash and Other (See Comments)    Reaction:  Facial/body swelling     VITALS:  Blood pressure (!) 144/76, pulse 76, temperature 99.4 F (37.4 C), temperature source Oral, resp. rate 19, height 5\' 5"  (1.651 m), weight 56 kg (123 lb 7.3 oz), SpO2 94 %.  PHYSICAL EXAMINATION:   Physical Exam  GENERAL:  61 y.o.-year-old patient lying in the bed, BiPAP EYES: Pupils equal, round, reactive to light and accommodation. No scleral icterus. Extraocular muscles intact.  HEENT: Head atraumatic, normocephalic. Oropharynx and nasopharynx clear.  NECK:  Supple, no jugular venous distention. No thyroid enlargement, no tenderness.  LUNGS: Poor air entry bilaterally CARDIOVASCULAR: S1, S2 normal. No murmurs, rubs, or gallops.  ABDOMEN: Soft, nontender, nondistended. Bowel sounds present. No organomegaly or mass.  EXTREMITIES: No cyanosis, clubbing or edema b/l.    NEUROLOGIC: Cranial nerves II through XII are intact. No focal Motor or sensory deficits b/l.   PSYCHIATRIC: The patient is drowsy SKIN: No obvious rash, lesion, or ulcer.   LABORATORY PANEL:   CBC  Recent Labs Lab 12/04/16 0310  WBC 8.4  HGB 14.2  HCT 43.1  PLT 298   ------------------------------------------------------------------------------------------------------------------ Chemistries   Recent Labs Lab 12/04/16 0354  NA 140  K 5.4*  CL  98*  CO2 32  GLUCOSE 112*  BUN 32*  CREATININE 6.67*  CALCIUM 8.7*  AST 31  ALT 22  ALKPHOS 222*  BILITOT 0.6   ------------------------------------------------------------------------------------------------------------------  Cardiac Enzymes No results for input(s): TROPONINI in the last 168 hours. ------------------------------------------------------------------------------------------------------------------  RADIOLOGY:  Ct Chest Wo Contrast  Result Date: 12/04/2016 CLINICAL DATA:  Fall with right rib fractures. Right rib pain. Initial encounter. EXAM: CT CHEST WITHOUT CONTRAST TECHNIQUE: Multidetector CT imaging of the chest was performed following the standard protocol without IV contrast. COMPARISON:  Chest radiographs 12/04/2016 and CT 01/06/2015 FINDINGS: The study is mildly degraded by motion artifact. Cardiovascular: Thoracic aortic and three-vessel coronary artery atherosclerosis. Mild ectasia of the ascending aorta which measures up to 3.8 cm in diameter, stable to slightly increased from prior. Cardiomegaly. No pericardial effusion. Mediastinum/Nodes: No enlarged axillary, mediastinal, or hilar lymph nodes identified on this unenhanced study. Calcified thyroid nodules. Lungs/Pleura: No pleural effusion or pneumothorax. There is confluent opacity posteriorly in the basilar right lower lobe with evidence of volume loss. Mild dependent atelectasis is present in the left lower lobe. Scattered lung cysts are again noted. There is mild centrilobular emphysema. Scattered small bilateral lung nodules do not appear significantly changed from the prior CT within limitations of motion artifact on both examinations. The largest nodule in the left lung measures 4 x 3 mm in the apical upper lobe (series 3, image 20), while the largest on the right measures 3 x 3 mm in the anterior upper lobe (series 3, image 80). Upper Abdomen: Partially visualized bilateral renal atrophy  with small low-density  and hyperattenuating lesions in both upper poles, incompletely evaluated though similar in size to those demonstrated on a 05/24/2016 abdominal CT and likely reflecting cysts of varying complexity. Musculoskeletal: Posterior right eleventh and twelfth rib fractures demonstrate approximately 1 shaft width displacement. There is a nondisplaced fracture of the right posterolateral tenth rib. 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 IMPRESSION: 1. Displaced posterior right eleventh and twelfth rib fractures. Nondisplaced right tenth rib fracture. 2. Right greater than left lower lobe atelectasis. 3. Emphysema with unchanged small lung nodules. Electronically Signed   By: Logan Bores M.D.   On: 12/04/2016 14:31   Dg Chest Port 1 View  Result Date: 12/06/2016 CLINICAL DATA:  Acute respiratory failure EXAM: PORTABLE CHEST 1 VIEW COMPARISON:  12/05/2016 FINDINGS: Cardiac shadow is enlarged. The aorta is again tortuous in appearance. Increasing right basilar atelectasis with volume loss is noted. The left lung remains clear. No bony abnormality is seen. IMPRESSION: Increasing right basilar atelectasis. Electronically Signed   By: Inez Catalina M.D.   On: 12/06/2016 07:14   Dg Chest Port 1 View  Result Date: 12/05/2016 CLINICAL DATA:  Evaluation acute respiratory failure. EXAM: PORTABLE CHEST 1 VIEW COMPARISON:  12/04/2016 FINDINGS: Stable enlarged cardiac silhouette. No effusion, infiltrate pneumothorax. Interval decreased interstitial edema better seen on prior. IMPRESSION: 1. Persistent cardiomegaly. 2. Mild improvement in interstitial edema pattern. Electronically Signed   By: Suzy Bouchard M.D.   On: 12/05/2016 12:08     ASSESSMENT AND PLAN:   * Acute on chronic respiratory failure due to rib fractures Needs BiPAP at this time.  Patient is critically ill. Discussed with Dr. Alva Garnet of pulmonary  * Acute encephalopathy due to CO2 narcosis. Place patient  on BiPAP  * Right Posterolateral 10,11,12 ribs fracture No lung contusion or pneumothorax. Appreciate surgery input  * COPD.  Nebs and inhalers  * End-stage renal disease on hemodialysis with mild hyperkalemia.  Appreciate nephrology help with HD  * DVT prophylaxis with heparin  All the records are reviewed and case discussed with Care Management/Social Workerr. Management plans discussed with the patient, family and they are in agreement.  CODE STATUS: FULL CODE  DVT Prophylaxis: SCDs  TOTAL TIME TAKING CARE OF THIS PATIENT: 35 minutes.   POSSIBLE D/C IN 2-3 DAYS, DEPENDING ON CLINICAL CONDITION.  Hillary Bow R M.D on 12/06/2016 at 12:07 PM  Between 7am to 6pm - Pager - [phone]  After 6pm go to [email or web address] - password [email or web address] [facility]  Office  [phone]  CC: Primary care physician; [name]  Note: This dictation was prepared with Dragon dictation along with smaller phrase technology. Any transcriptional errors that result from this process are unintentional.

## 2016-12-07 ENCOUNTER — Inpatient Hospital Stay: Payer: Medicare Other

## 2016-12-07 LAB — BASIC METABOLIC PANEL
Anion gap: 14 (ref 5–15)
Anion gap: 14 (ref 5–15)
BUN: 65 mg/dL — AB (ref 6–20)
BUN: 64 mg/dL — ABNORMAL HIGH (ref 6–20)
CALCIUM: 8.1 mg/dL — AB (ref 8.9–10.3)
CO2: 27 mmol/L (ref 22–32)
CO2: 28 mmol/L (ref 22–32)
CREATININE: 9.42 mg/dL — AB (ref 0.44–1.00)
Calcium: 8.1 mg/dL — ABNORMAL LOW (ref 8.9–10.3)
Chloride: 94 mmol/L — ABNORMAL LOW (ref 101–111)
Chloride: 94 mmol/L — ABNORMAL LOW (ref 101–111)
Creatinine, Ser: 9.5 mg/dL — ABNORMAL HIGH (ref 0.44–1.00)
GFR calc Af Amer: 5 mL/min — ABNORMAL LOW (ref >60)
GFR calc non Af Amer: 4 mL/min — ABNORMAL LOW (ref >60)
GFR calc non Af Amer: 4 mL/min — ABNORMAL LOW (ref >60)
GFR, EST AFRICAN AMERICAN: 5 mL/min — AB (ref >60)
GLUCOSE: 138 mg/dL — AB (ref 65–99)
Glucose, Bld: 135 mg/dL — ABNORMAL HIGH (ref 65–99)
Potassium: 6.4 mmol/L (ref 3.5–5.1)
Potassium: 6.4 mmol/L (ref 3.5–5.1)
Sodium: 135 mmol/L (ref 135–145)
Sodium: 136 mmol/L (ref 135–145)

## 2016-12-07 LAB — PHOSPHORUS: Phosphorus: 6.5 mg/dL — ABNORMAL HIGH (ref 2.5–4.6)

## 2016-12-07 LAB — CBC
HEMATOCRIT: 40.2 % (ref 35.0–47.0)
Hemoglobin: 12.8 g/dL (ref 12.0–16.0)
MCH: 31.5 pg (ref 26.0–34.0)
MCHC: 31.9 g/dL — AB (ref 32.0–36.0)
MCV: 98.9 fL (ref 80.0–100.0)
Platelets: 259 10*3/uL (ref 150–440)
RBC: 4.06 MIL/uL (ref 3.80–5.20)
RDW: 17.8 % — AB (ref 11.5–14.5)
WBC: 10.7 10*3/uL (ref 3.6–11.0)

## 2016-12-07 LAB — POTASSIUM
Potassium: 5.4 mmol/L — ABNORMAL HIGH (ref 3.5–5.1)
Potassium: 5.3 mmol/L — ABNORMAL HIGH (ref 3.5–5.1)
Potassium: 4.2 mmol/L (ref 3.5–5.1)
Potassium: 5 mmol/L (ref 3.5–5.1)

## 2016-12-07 LAB — GLUCOSE, CAPILLARY: Glucose-Capillary: 267 mg/dL — ABNORMAL HIGH (ref 65–99)

## 2016-12-07 MED ORDER — IPRATROPIUM-ALBUTEROL 0.5-2.5 (3) MG/3ML IN SOLN
3.0000 mL | Freq: Four times a day (QID) | RESPIRATORY_TRACT | Status: DC
  Administered 2016-12-07 – 2016-12-08 (×4): 3 mL via RESPIRATORY_TRACT
  Filled 2016-12-07 (×4): qty 3

## 2016-12-07 MED ORDER — FUROSEMIDE 10 MG/ML IJ SOLN
40.0000 mg | Freq: Once | INTRAMUSCULAR | Status: AC
  Administered 2016-12-07: 40 mg via INTRAVENOUS
  Filled 2016-12-07: qty 4

## 2016-12-07 MED ORDER — SODIUM POLYSTYRENE SULFONATE 15 GM/60ML PO SUSP
15.0000 g | Freq: Once | ORAL | Status: AC
  Administered 2016-12-07: 15 g via ORAL
  Filled 2016-12-07: qty 60

## 2016-12-07 MED ORDER — KETOROLAC TROMETHAMINE 15 MG/ML IJ SOLN
15.0000 mg | Freq: Four times a day (QID) | INTRAMUSCULAR | Status: DC | PRN
  Administered 2016-12-07 – 2016-12-11 (×7): 15 mg via INTRAVENOUS
  Filled 2016-12-07 (×6): qty 1

## 2016-12-07 NOTE — Progress Notes (Signed)
Pre-hd tx 

## 2016-12-07 NOTE — Progress Notes (Signed)
Post hd tx 

## 2016-12-07 NOTE — Progress Notes (Signed)
Potassium level of 6.4 reported. Dr Marcille Blanco made aware.

## 2016-12-07 NOTE — Progress Notes (Signed)
Patient transferred from 2A today for bipap. Patient wore bipap about 2 hours after arriving to unit and requested to be back on [place]. Patient alert and oriented x4 and no longer lethargic, patient placed on 2L [place] by RT. Patient did well the rest of shift without bipap, and currently receiving HD. C/o rib pain, tramadol given. Vss. Will continue to assess. Wilnette Kales

## 2016-12-07 NOTE — Progress Notes (Addendum)
[facility] [place] NAME: Michele Nash    MR#:  [id number]  DATE OF BIRTH:  1956/04/11  SUBJECTIVE:  CHIEF COMPLAINT:   Chief Complaint  Patient presents with  . Fall  . Rib Injury   Admitted after a fall with right chest pain. Found to have multiple posterior right rib fracture  Lethargic today. Had hemodialysis, transferred to ICU for shortness of breath   REVIEW OF SYSTEMS:    Review of Systems  Unable to perform ROS: Mental status change    DRUG ALLERGIES:   Allergies  Allergen Reactions  . Chantix [Varenicline]     hallucinations  . Sulfa Antibiotics Itching, Swelling, Rash and Other (See Comments)    Reaction:  Facial/body swelling     VITALS:  Blood pressure 133/73, pulse 76, temperature 97.7 F (36.5 C), temperature source Oral, resp. rate 15, height 5\' 5"  (1.651 m), weight 57.4 kg (126 lb 9.6 oz), SpO2 91 %.  PHYSICAL EXAMINATION:   Physical Exam  GENERAL:  61 y.o.-year-old patient lying in the bed, BiPAP EYES: Pupils equal, round, reactive to light and accommodation. No scleral icterus. Extraocular muscles intact.  HEENT: Head atraumatic, normocephalic. Oropharynx and nasopharynx clear.  NECK:  Supple, no jugular venous distention. No thyroid enlargement, no tenderness.  LUNGS: Poor air entry bilaterally CARDIOVASCULAR: S1, S2 normal. No murmurs, rubs, or gallops.  ABDOMEN: Soft, nontender, nondistended. Bowel sounds present. No organomegaly or mass.  EXTREMITIES: No cyanosis, clubbing or edema b/l.    NEUROLOGIC: Cranial nerves II through XII are intact. No focal Motor or sensory deficits b/l.   PSYCHIATRIC: The patient is drowsy SKIN: No obvious rash, lesion, or ulcer.   LABORATORY PANEL:   CBC  Recent Labs Lab 12/07/16 0510  WBC 10.7  HGB 12.8  HCT 40.2  PLT 259   ------------------------------------------------------------------------------------------------------------------ Chemistries    Recent Labs Lab 12/04/16 0354  12/07/16 0615  12/07/16 1358  NA 140  < > 136  --   --   K 5.4*  < > 6.4*  < > 5.3*  CL 98*  < > 94*  --   --   CO2 32  < > 28  --   --   GLUCOSE 112*  < > 135*  --   --   BUN 32*  < > 64*  --   --   CREATININE 6.67*  < > 9.50*  --   --   CALCIUM 8.7*  < > 8.1*  --   --   AST 31  --   --   --   --   ALT 22  --   --   --   --   ALKPHOS 222*  --   --   --   --   BILITOT 0.6  --   --   --   --   < > = values in this interval not displayed. ------------------------------------------------------------------------------------------------------------------  Cardiac Enzymes No results for input(s): TROPONINI in the last 168 hours. ------------------------------------------------------------------------------------------------------------------  RADIOLOGY:  Dg Chest Port 1 View  Result Date: 12/06/2016 CLINICAL DATA:  Acute respiratory failure EXAM: PORTABLE CHEST 1 VIEW COMPARISON:  12/05/2016 FINDINGS: Cardiac shadow is enlarged. The aorta is again tortuous in appearance. Increasing right basilar atelectasis with volume loss is noted. The left lung remains clear. No bony abnormality is seen. IMPRESSION: Increasing right basilar atelectasis. Electronically Signed   By: Inez Catalina M.D.   On: 12/06/2016 07:14  ASSESSMENT AND PLAN:   * Acute on chronic respiratory failureWith hypoxia due to rib fractures BiPAP as needed Continue close monitoring Oxygen via nasal cannula Follow-up with pulmonology Check a.m. labs  * Acute encephalopathy due to CO2 narcosis. Place patient on BiPAP as needed  * Right Posterolateral 10,11,12 ribs fracture No lung contusion or pneumothorax. Appreciate surgery input  * COPD.  Nebs and inhalers  * End-stage renal disease on hemodialysis with hyperkalemia.  Patient had hemodialysis today and potassium better; potassium 6.4-5.4-5.3 Appreciate nephrology help with HD  * DVT prophylaxis with heparin  All  the records are reviewed and case discussed with Care Management/Social Workerr. Management plans discussed with the patient, family and they are in agreement.  CODE STATUS: FULL CODE  DVT Prophylaxis: SCDs  TOTAL critical care TIME TAKING CARE OF THIS PATIENT: 35 minutes.   POSSIBLE D/C IN 2-3 DAYS, DEPENDING ON CLINICAL CONDITION.  Nicholes Mango M.D on 12/07/2016 at 4:06 PM  Between 7am to 6pm - Pager - [phone]  After 6pm go to [email or web address] - password [email or web address] [facility]  Office  [phone]  CC: Primary care physician; [name]  Note: This dictation was prepared with Dragon dictation along with smaller phrase technology. Any transcriptional errors that result from this process are unintentional.

## 2016-12-07 NOTE — Progress Notes (Signed)
[facility]  ROUNDING NOTE   Subjective:  Patient's status worsened today. Patient is lethargic and falls asleep easily. Therefore she was transitioned back to the CCU for BiPAP. Hyperkalemia noted.  Objective:  Vital signs in last 24 hours:  Temp:  [97.9 F (36.6 C)-98.6 F (37 C)] 97.9 F (36.6 C) (01/12 1300) Pulse Rate:  [66-86] 86 (01/12 1400) Resp:  [15-23] 18 (01/12 1400) BP: (111-177)/(56-100) 144/77 (01/12 1400) SpO2:  [90 %-100 %] 94 % (01/12 1400) Weight:  [57.4 kg (126 lb 9.6 oz)] 57.4 kg (126 lb 9.6 oz) (01/11 2345)  Weight change:  Filed Weights   12/04/16 0231 12/04/16 1240 12/06/16 2345  Weight: 61.2 kg (135 lb) 56 kg (123 lb 7.3 oz) 57.4 kg (126 lb 9.6 oz)    Intake/Output: I/O last 3 completed shifts: In: 49 [P.O.:720; IV Piggyback:50] Out: 2000 [Other:2000]   Intake/Output this shift:  Total I/O In: 290 [P.O.:240; IV Piggyback:50] Out: 0   Physical Exam: General: Critically ill appearing   Head: Normocephalic, atraumatic. Moist oral mucosal membranes  Eyes: Anicteric  Neck: Supple, trachea midline  Lungs:  Basilar rales, on bipap  Heart: S1S2 no rubs  Abdomen:  Soft, nontener, bowel sounds present  Extremities: No peripheral edema.  Neurologic: Nonfocal, moving all four extremities  Skin: No lesions  Access: LUE AVF    Basic Metabolic Panel:  Recent Labs Lab 12/04/16 0354 12/05/16 1526 12/07/16 0510 12/07/16 0615 12/07/16 0940 12/07/16 1358  NA 140  --  135 136  --   --   K 5.4*  --  6.4* 6.4* 5.4* 5.3*  CL 98*  --  94* 94*  --   --   CO2 32  --  27 28  --   --   GLUCOSE 112*  --  138* 135*  --   --   BUN 32*  --  65* 64*  --   --   CREATININE 6.67*  --  9.42* 9.50*  --   --   CALCIUM 8.7*  --  8.1* 8.1*  --   --   PHOS  --  6.5*  --   --   --  6.5*    Liver Function Tests:  Recent Labs Lab 12/04/16 0354  AST 31  ALT 22  ALKPHOS 222*  BILITOT 0.6  PROT 7.9  ALBUMIN 3.9   No results for input(s):  LIPASE, AMYLASE in the last 168 hours. No results for input(s): AMMONIA in the last 168 hours.  CBC:  Recent Labs Lab 12/04/16 0310 12/07/16 0510  WBC 8.4 10.7  NEUTROABS 6.0  --   HGB 14.2 12.8  HCT 43.1 40.2  MCV 95.2 98.9  PLT 298 259    Cardiac Enzymes: No results for input(s): CKTOTAL, CKMB, CKMBINDEX, TROPONINI in the last 168 hours.  BNP: Invalid input(s): POCBNP  CBG:  Recent Labs Lab 12/04/16 1241 12/07/16 1024  GLUCAP 117* 54*    Microbiology: Results for orders placed or performed during the hospital encounter of 12/04/16  Culture, blood (Routine x 2)     Status: None (Preliminary result)   Collection Time: 12/04/16  3:11 AM  Result Value Ref Range Status   Specimen Description BLOOD R WRIST  Final   Special Requests   Final    BOTTLES DRAWN AEROBIC AND ANAEROBIC AER 5ML ANA 2ML   Culture NO GROWTH 3 DAYS  Final   Report Status PENDING  Incomplete  Culture, blood (Routine x 2)  Status: None (Preliminary result)   Collection Time: 12/04/16  3:37 AM  Result Value Ref Range Status   Specimen Description BLOOD R FA  Final   Special Requests   Final    BOTTLES DRAWN AEROBIC AND ANAEROBIC AER 8ML ANA 6ML   Culture NO GROWTH 3 DAYS  Final   Report Status PENDING  Incomplete  Urine culture     Status: None   Collection Time: 12/04/16  4:46 AM  Result Value Ref Range Status   Specimen Description URINE, RANDOM  Final   Special Requests NONE  Final   Culture NO GROWTH Performed at [facility]   Final   Report Status 12/05/2016 FINAL  Final    Coagulation Studies: No results for input(s): LABPROT, INR in the last 72 hours.  Urinalysis: No results for input(s): COLORURINE, LABSPEC, PHURINE, GLUCOSEU, HGBUR, BILIRUBINUR, KETONESUR, PROTEINUR, UROBILINOGEN, NITRITE, LEUKOCYTESUR in the last 72 hours.  Invalid input(s): APPERANCEUR    Imaging: Dg Chest Port 1 View  Result Date: 12/06/2016 CLINICAL DATA:  Acute respiratory failure EXAM:  PORTABLE CHEST 1 VIEW COMPARISON:  12/05/2016 FINDINGS: Cardiac shadow is enlarged. The aorta is again tortuous in appearance. Increasing right basilar atelectasis with volume loss is noted. The left lung remains clear. No bony abnormality is seen. IMPRESSION: Increasing right basilar atelectasis. Electronically Signed   By: Inez Catalina M.D.   On: 12/06/2016 07:14     Medications:    . amLODipine  10 mg Oral Daily  . calcium acetate  1,334 mg Oral TID WC  . carvedilol  12.5 mg Oral BID WC  . cefTRIAXone  1 g Intravenous Q24H  . chlorhexidine  15 mL Mouth Rinse BID  . cinacalcet  60 mg Oral Q breakfast  . feeding supplement (ENSURE ENLIVE)  237 mL Oral TID BM  . furosemide  40 mg Oral Q T,Th,S,Su  . heparin  5,000 Units Subcutaneous Q8H  . ipratropium-albuterol  3 mL Nebulization Q6H  . losartan  100 mg Oral Daily  . mouth rinse  15 mL Mouth Rinse q12n4p  . pantoprazole  40 mg Oral BID  . sertraline  50 mg Oral Daily   sodium chloride, sodium chloride, acetaminophen **OR** acetaminophen, ALPRAZolam, alteplase, bisacodyl, fluticasone, heparin, hydrALAZINE, ketorolac, lidocaine (PF), lidocaine-prilocaine, lidocaine-prilocaine, ondansetron **OR** ondansetron (ZOFRAN) IV, pentafluoroprop-tetrafluoroeth, senna-docusate, traMADol  Assessment/ Plan:  61 y.o. female with hypertension, hyperlipidemia, diastolic heart failure, hypertension, COPD, tobacco abuse, AOCD, SHPTH, LUE AVF, ESRD 02/2013, cocaine abuse, hx of recurrent falls, readmission for fall/posterior right rib fracture 12/05/16  [facility]/MWF  1. ESRD on HD MWF. Patient lethargic again and moved back to the critical care unit.  Therefore we will perform hemodialysis while the patient is in the critical care unit.  2. Hyperkalemia. Potassium was up to 6.4 earlier today.  Potassium now down to 5.3.  We will place the patient on 2K bath during dialysis.  3. Anemia of chronic kidney disease. Last hemoglobin was down a bit to 12.8.   Continue to monitor.  Hold off on Epogen.  4. Secondary hyperparathyroidism. Followup phosphorus with dialysis today.  5. Acute respiratory failure.  Patient back on BiPAP she was lethargic and it is suspected that her PCO2 has risen again.   LOS: 3 Lisl Slingerland 1/12/20182:48 PM

## 2016-12-07 NOTE — Progress Notes (Signed)
Pt placed back on bipap per Hinton Dyer NP due to increased lethargy. Orders to transfer back to ICU.

## 2016-12-07 NOTE — Progress Notes (Signed)
Pt complaining of midsternal chest pain/pressure. Pain  Increases on palpation, states 7/10 pain. Pt states pain began when bipap placed. NP notified.

## 2016-12-07 NOTE — Progress Notes (Signed)
Pt arrived from unit alert and oriented. No SOB, pain reported 5/10. PRN tylenol given at this time. Telemerty box and skin verified with Lexi Rn. No concerns offered.

## 2016-12-07 NOTE — Progress Notes (Signed)
PT Cancellation Note  Patient Details Name: TEMPRENCE BODY MRN: [id number] DOB: 08-19-1956   Cancelled Treatment:    Reason Eval/Treat Not Completed: Other (comment). Per chart review, pt with change in medical status and moved back to CCU this date. Pt lethargic, needing continuous bi-pap treatment. Pt also with elevated K+ at 6.4 this date. Pt will need new PT orders to resume care at this time. Will cancel current orders. Please re-order when pt appropriate.   Sharissa Brierley 12/07/2016, 2:12 PM  Greggory Stallion, PT, DPT [phone]

## 2016-12-07 NOTE — Progress Notes (Signed)
This note also relates to the following rows which could not be included: Pulse Rate - Cannot attach notes to unvalidated device data Resp - Cannot attach notes to unvalidated device data BP - Cannot attach notes to unvalidated device data SpO2 - Cannot attach notes to unvalidated device data  Hemodialysis treatment started

## 2016-12-07 NOTE — Progress Notes (Signed)
Post Hemodialysis treatment: Tolerated 3.5hours of treatment with 2.5liters net removal.Hemastasis achieved,sites dressed with gauze/taped.Patient sitting up watching television at discharge.

## 2016-12-07 NOTE — Progress Notes (Signed)
Hemodialysis completed. 

## 2016-12-07 NOTE — Progress Notes (Signed)
Name: Michele Nash MRN: [id number] DOB: [birth year]    ADMISSION DATE:  12/04/2016 CONSULTATION DATE: 12/05/2016  REFERRING MD :  Dr. Darvin Neighbours  CHIEF COMPLAINT:  Fall and Rib Injury  BRIEF PATIENT DESCRIPTION: This is a 61 yo female who presented to [facility] ER 01/9 post fall at home and found to have multiple posterior right rib fractures in acute on chronic hypoxic hypercapnic respiratory failure secondary to ?right lung contusion  SIGNIFICANT EVENTS  01/9-Pt admitted to [facility] ICU post fall at home in ER found to have multiple posterior right rib fractures and in acute respiratory failure 01/10-PCCM consulted for acute on chronic hypoxic hypercapnic respiratory failure secondary to ?right lung contusion requiring Bipap  01/11-Pt transferred to 2A 01/12-Pt transferred back to ICU due to increased lethargy secondary to hypercapnia requiring continuous Bipap  STUDIES:  CT Chest 01/9>>Displaced posterior right eleventh and twelfth rib fractures. Nondisplaced right tenth rib fracture. Right greater than left lower lobe atelectasis. Emphysema with unchanged small lung nodules. CT Head 01/9>>No acute abnormality noted  HISTORY OF PRESENT ILLNESS:   This is a 61 yo female with a PMH of ESRD on Hemodialysis, Obesity, Hypertension, Hyperlipidemia, GERD, Depression, COPD, Home O2 at 2L via nasal canula, Bronchitis, Arthritis, Sleep Apnea, and Anemia.  She presented to [facility] ER on 01/9 following an unwitnessed fall at home with c/o right sided rib pain after falling on the edge of a small table.  In the ER the pt was somnolent but arousable and hypoxic with O2 sats 70-80% on 4L O2 via nasal canula.  A CT Chest on 01/9 revealed the pt had displaced posterior right eleventh and twelfth rib fractures and nondisplaced right tenth rib fracture.  She was admitted to ICU due to acute on chronic hypoxic hypercapnic respiratory failure secondary to ?right lung contusion due to multiple rib fractures following a fall.   PCCM consulted 01/10 due to acute encephalopathy secondary to hypercapnia requiring intermittent Bipap.    REVIEW OF SYSTEMS:  Unable to assess pt lethargic and unable to answer questions  SUBJECTIVE: Per nursing staff states pt has been lethargic and difficult to arouse this am.  VITAL SIGNS: Temp:  [98 F (36.7 C)-98.9 F (37.2 C)] 98.2 F (36.8 C) (01/12 0524) Pulse Rate:  [66-79] 75 (01/12 0858) Resp:  [15-23] 18 (01/12 0524) BP: (111-177)/(56-100) 142/56 (01/12 0858) SpO2:  [90 %-100 %] 100 % (01/12 0858) Weight:  [57.4 kg (126 lb 9.6 oz)] 57.4 kg (126 lb 9.6 oz) (01/11 2345)  PHYSICAL EXAMINATION: General: AA female, well developed, well nourished  Neuro:  lethargic, follows commands, PERRLA  HEENT:  Supple, no JVD Cardiovascular: NSR, s1s2, rrr, no M/R/G Lungs:  Diminished throughout, even, non labored on nasal canula Abdomen:  +BS x, soft, non tender, non distended Musculoskeletal:  Normal bulk and tone no edema Skin:  Intact no rashes or lesions   Recent Labs Lab 12/04/16 0354 12/07/16 0510 12/07/16 0615  NA 140 135 136  K 5.4* 6.4* 6.4*  CL 98* 94* 94*  CO2 32 27 28  BUN 32* 65* 64*  CREATININE 6.67* 9.42* 9.50*  GLUCOSE 112* 138* 135*    Recent Labs Lab 12/04/16 0310 12/07/16 0510  HGB 14.2 12.8  HCT 43.1 40.2  WBC 8.4 10.7  PLT 298 259   Dg Chest Port 1 View  Result Date: 12/06/2016 CLINICAL DATA:  Acute respiratory failure EXAM: PORTABLE CHEST 1 VIEW COMPARISON:  12/05/2016 FINDINGS: Cardiac shadow is enlarged. The aorta is again tortuous in  appearance. Increasing right basilar atelectasis with volume loss is noted. The left lung remains clear. No bony abnormality is seen. IMPRESSION: Increasing right basilar atelectasis. Electronically Signed   By: Inez Catalina M.D.   On: 12/06/2016 07:14   Dg Chest Port 1 View  Result Date: 12/05/2016 CLINICAL DATA:  Evaluation acute respiratory failure. EXAM: PORTABLE CHEST 1 VIEW COMPARISON:  12/04/2016  FINDINGS: Stable enlarged cardiac silhouette. No effusion, infiltrate pneumothorax. Interval decreased interstitial edema better seen on prior. IMPRESSION: 1. Persistent cardiomegaly. 2. Mild improvement in interstitial edema pattern. Electronically Signed   By: Suzy Bouchard M.D.   On: 12/05/2016 12:08    ASSESSMENT / PLAN: Acute on chronic hypoxic hypercapnic respiratory failure secondary to ?right lung contusion Right rib fracture Acute encephalopathy secondary to hypercapnia  Hx: COPD, Bronchitis, Chronic Home O2 at 2L, Sleep Apnea, Obesity, and ESRD on hemodialysis  P: Will transfer pt back to ICU today for continuous Bipap  Continue bronchodilator therapy Intermittent CXR Prn ABG's Per surgery no need for surgical intervention or chest tube placement no pneumothorax Hemodialysis per nephrology recommendations  Avoid sedating medications Prn toradol and tylenol for pain management  Frequent reorientation  Lights on during the day  Early ambulation Once more alert aggressive pulmonary hygiene  Marda Stalker, [place] Pager [phone] (please enter 7 digits) PCCM Consult Pager [phone] (please enter 7 [phone])   PCCM ATTENDING ATTESTATION:  I have evaluated patient with the APP [place], reviewed database in its entirety and discussed care plan in detail. In addition, this patient was discussed on multidisciplinary rounds. The above assessment and plan has been formulated by me   Merton Border, MD PCCM service Mobile [phone] Pager [phone]

## 2016-12-07 NOTE — Progress Notes (Signed)
Patient arousable and able to answer questions appropriately but lethargic and falls asleep mid sentences. Patient placed back on bipap and orders received to transfer patient back to CCU. Report called to [facility] and patient transferred.

## 2016-12-07 NOTE — Progress Notes (Signed)
40 mg Lasix and 15 Kayexalate given per MD order.

## 2016-12-08 LAB — BASIC METABOLIC PANEL
Anion gap: 13 (ref 5–15)
BUN: 40 mg/dL — ABNORMAL HIGH (ref 6–20)
CO2: 29 mmol/L (ref 22–32)
Calcium: 8.6 mg/dL — ABNORMAL LOW (ref 8.9–10.3)
Chloride: 96 mmol/L — ABNORMAL LOW (ref 101–111)
Creatinine, Ser: 6.6 mg/dL — ABNORMAL HIGH (ref 0.44–1.00)
GFR calc Af Amer: 7 mL/min — ABNORMAL LOW (ref >60)
GFR, EST NON AFRICAN AMERICAN: 6 mL/min — AB (ref >60)
GLUCOSE: 186 mg/dL — AB (ref 65–99)
POTASSIUM: 4.6 mmol/L (ref 3.5–5.1)
Sodium: 138 mmol/L (ref 135–145)

## 2016-12-08 LAB — PHOSPHORUS: PHOSPHORUS: 5.6 mg/dL — AB (ref 2.5–4.6)

## 2016-12-08 LAB — MAGNESIUM: MAGNESIUM: 1.7 mg/dL (ref 1.7–2.4)

## 2016-12-08 LAB — CBC
HEMATOCRIT: 37.9 % (ref 35.0–47.0)
HEMOGLOBIN: 12.2 g/dL (ref 12.0–16.0)
MCH: 31.6 pg (ref 26.0–34.0)
MCHC: 32.1 g/dL (ref 32.0–36.0)
MCV: 98.4 fL (ref 80.0–100.0)
Platelets: 246 10*3/uL (ref 150–440)
RBC: 3.85 MIL/uL (ref 3.80–5.20)
RDW: 17.9 % — ABNORMAL HIGH (ref 11.5–14.5)
WBC: 7 10*3/uL (ref 3.6–11.0)

## 2016-12-08 LAB — POTASSIUM
POTASSIUM: 5.3 mmol/L — AB (ref 3.5–5.1)
Potassium: 5.3 mmol/L — ABNORMAL HIGH (ref 3.5–5.1)
Potassium: 5.1 mmol/L (ref 3.5–5.1)

## 2016-12-08 MED ORDER — IPRATROPIUM-ALBUTEROL 0.5-2.5 (3) MG/3ML IN SOLN
3.0000 mL | Freq: Four times a day (QID) | RESPIRATORY_TRACT | Status: DC
  Administered 2016-12-08 – 2016-12-11 (×10): 3 mL via RESPIRATORY_TRACT
  Filled 2016-12-08 (×12): qty 3

## 2016-12-08 MED ORDER — BUDESONIDE 0.25 MG/2ML IN SUSP
0.2500 mg | Freq: Two times a day (BID) | RESPIRATORY_TRACT | Status: DC
  Administered 2016-12-08 – 2016-12-11 (×6): 0.25 mg via RESPIRATORY_TRACT
  Filled 2016-12-08 (×6): qty 2

## 2016-12-08 MED ORDER — ALUM & MAG HYDROXIDE-SIMETH 200-200-20 MG/5ML PO SUSP
30.0000 mL | ORAL | Status: DC | PRN
  Administered 2016-12-08: 30 mL via ORAL
  Filled 2016-12-08: qty 30

## 2016-12-08 MED ORDER — PANTOPRAZOLE SODIUM 40 MG PO TBEC
40.0000 mg | DELAYED_RELEASE_TABLET | Freq: Every day | ORAL | Status: DC
  Administered 2016-12-08 – 2016-12-11 (×4): 40 mg via ORAL
  Filled 2016-12-08 (×4): qty 1

## 2016-12-08 NOTE — Progress Notes (Signed)
[facility]  ROUNDING NOTE   Subjective:  Patient seen at bedside. Now off of BiPAP. Much more awake and alert today. Patient also completed hemodialysis yesterday.  Objective:  Vital signs in last 24 hours:  Temp:  [97.6 F (36.4 C)-98.6 F (37 C)] 97.6 F (36.4 C) (01/13 0200) Pulse Rate:  [69-87] 82 (01/13 0500) Resp:  [13-24] 19 (01/13 0500) BP: (110-166)/(65-107) 134/69 (01/13 0500) SpO2:  [88 %-100 %] 94 % (01/13 0500) FiO2 (%):  [32 %] 32 % (01/13 0248)  Weight change:  Filed Weights   12/04/16 0231 12/04/16 1240 12/06/16 2345  Weight: 61.2 kg (135 lb) 56 kg (123 lb 7.3 oz) 57.4 kg (126 lb 9.6 oz)    Intake/Output: I/O last 3 completed shifts: In: 992.7 [P.O.:942.7; IV Piggyback:50] Out: 2500 [Other:2500]   Intake/Output this shift:  No intake/output data recorded.  Physical Exam: General: No acute distress   Head: Normocephalic, atraumatic. Moist oral mucosal membranes  Eyes: Anicteric  Neck: Supple, trachea midline  Lungs:  Scattered rhonchi, normal effort   Heart: S1S2 no rubs  Abdomen:  Soft, nontener, bowel sounds present  Extremities: No peripheral edema.  Neurologic: Nonfocal, moving all four extremities  Skin: No lesions  Access: LUE AVF    Basic Metabolic Panel:  Recent Labs Lab 12/04/16 0354 12/05/16 1526 12/07/16 0510 12/07/16 0615 12/07/16 0940 12/07/16 1358 12/07/16 1829 12/07/16 2217 12/08/16 0454  NA 140  --  135 136  --   --   --   --  138  K 5.4*  --  6.4* 6.4* 5.4* 5.3* 4.2 5.0 4.6  CL 98*  --  94* 94*  --   --   --   --  96*  CO2 32  --  27 28  --   --   --   --  29  GLUCOSE 112*  --  138* 135*  --   --   --   --  186*  BUN 32*  --  65* 64*  --   --   --   --  40*  CREATININE 6.67*  --  9.42* 9.50*  --   --   --   --  6.60*  CALCIUM 8.7*  --  8.1* 8.1*  --   --   --   --  8.6*  MG  --   --   --   --   --   --   --   --  1.7  PHOS  --  6.5*  --   --   --  6.5*  --   --  5.6*    Liver Function  Tests:  Recent Labs Lab 12/04/16 0354  AST 31  ALT 22  ALKPHOS 222*  BILITOT 0.6  PROT 7.9  ALBUMIN 3.9   No results for input(s): LIPASE, AMYLASE in the last 168 hours. No results for input(s): AMMONIA in the last 168 hours.  CBC:  Recent Labs Lab 12/04/16 0310 12/07/16 0510 12/08/16 0454  WBC 8.4 10.7 7.0  NEUTROABS 6.0  --   --   HGB 14.2 12.8 12.2  HCT 43.1 40.2 37.9  MCV 95.2 98.9 98.4  PLT 298 259 246    Cardiac Enzymes: No results for input(s): CKTOTAL, CKMB, CKMBINDEX, TROPONINI in the last 168 hours.  BNP: Invalid input(s): POCBNP  CBG:  Recent Labs Lab 12/04/16 1241 12/07/16 1024  GLUCAP 117* 267*    Microbiology: Results for orders placed or performed during  the hospital encounter of 12/04/16  Culture, blood (Routine x 2)     Status: None (Preliminary result)   Collection Time: 12/04/16  3:11 AM  Result Value Ref Range Status   Specimen Description BLOOD R WRIST  Final   Special Requests   Final    BOTTLES DRAWN AEROBIC AND ANAEROBIC AER 5ML ANA 2ML   Culture NO GROWTH 3 DAYS  Final   Report Status PENDING  Incomplete  Culture, blood (Routine x 2)     Status: None (Preliminary result)   Collection Time: 12/04/16  3:37 AM  Result Value Ref Range Status   Specimen Description BLOOD R FA  Final   Special Requests   Final    BOTTLES DRAWN AEROBIC AND ANAEROBIC AER 8ML ANA 6ML   Culture NO GROWTH 3 DAYS  Final   Report Status PENDING  Incomplete  Urine culture     Status: None   Collection Time: 12/04/16  4:46 AM  Result Value Ref Range Status   Specimen Description URINE, RANDOM  Final   Special Requests NONE  Final   Culture NO GROWTH Performed at [facility]   Final   Report Status 12/05/2016 FINAL  Final    Coagulation Studies: No results for input(s): LABPROT, INR in the last 72 hours.  Urinalysis: No results for input(s): COLORURINE, LABSPEC, PHURINE, GLUCOSEU, HGBUR, BILIRUBINUR, KETONESUR, PROTEINUR, UROBILINOGEN,  NITRITE, LEUKOCYTESUR in the last 72 hours.  Invalid input(s): APPERANCEUR    Imaging: Dg Chest Port 1 View  Result Date: 12/07/2016 CLINICAL DATA:  Acute respiratory failure. EXAM: PORTABLE CHEST 1 VIEW COMPARISON:  12/06/2016 FINDINGS: The cardio pericardial silhouette is enlarged. Right base atelectasis again noted. No pulmonary edema or substantial pleural effusion. Skin fold projects over the left lateral lung. Calcification at the right apex projected outside the lung on prior exam. The visualized bony structures of the thorax are intact. Telemetry leads overlie the chest. IMPRESSION: Cardiomegaly with progressive right basilar atelectasis. Electronically Signed   By: Misty Stanley M.D.   On: 12/07/2016 20:53     Medications:    . amLODipine  10 mg Oral Daily  . calcium acetate  1,334 mg Oral TID WC  . carvedilol  12.5 mg Oral BID WC  . cefTRIAXone  1 g Intravenous Q24H  . chlorhexidine  15 mL Mouth Rinse BID  . cinacalcet  60 mg Oral Q breakfast  . feeding supplement (ENSURE ENLIVE)  237 mL Oral TID BM  . furosemide  40 mg Oral Q T,Th,S,Su  . heparin  5,000 Units Subcutaneous Q8H  . ipratropium-albuterol  3 mL Nebulization Q6H  . losartan  100 mg Oral Daily  . mouth rinse  15 mL Mouth Rinse q12n4p  . pantoprazole  40 mg Oral BID  . sertraline  50 mg Oral Daily   sodium chloride, sodium chloride, acetaminophen **OR** acetaminophen, ALPRAZolam, alteplase, bisacodyl, fluticasone, heparin, hydrALAZINE, ketorolac, lidocaine (PF), lidocaine-prilocaine, lidocaine-prilocaine, ondansetron **OR** ondansetron (ZOFRAN) IV, pentafluoroprop-tetrafluoroeth, senna-docusate, traMADol  Assessment/ Plan:  61 y.o. female with hypertension, hyperlipidemia, diastolic heart failure, hypertension, COPD, tobacco abuse, AOCD, SHPTH, LUE AVF, ESRD 02/2013, cocaine abuse, hx of recurrent falls, readmission for fall/posterior right rib fracture 12/05/16  [facility] [place]/MWF  1. ESRD on HD MWF. Patient  now awake and alert. She completed hemodialysis yesterday. No urgent indication for dialysis today. We will plan for hemodialysis again on Monday.  2. Hyperkalemia. Potassium down to 4.6 today. Continue to monitor serum potassium.  3. Anemia of chronic kidney disease.  Continue to hold Epogen for now for hemoglobin of 12.2.  4. Secondary hyperparathyroidism. Phosphorus down to 5.6.  Continue calcium acetate 2 tablets by mouth 3 times a day with meals.  5. Acute respiratory failure.  Patient likely having nocturnal hypercarbia. Recommend CPAP nightly. Discussed with pulmonary/critical care.   LOS: 4 Chardai Gangemi 1/13/20189:19 AM

## 2016-12-08 NOTE — Progress Notes (Signed)
Pt to transfer to room 205. Report called to [facility] on [place].

## 2016-12-08 NOTE — Progress Notes (Signed)
Name: Michele Nash MRN: [id number] DOB: 1956/01/31    ADMISSION DATE:  12/04/2016 CONSULTATION DATE: 12/05/2016  REFERRING MD :  Dr. Darvin Neighbours  CHIEF COMPLAINT:  Fall and Rib Injury  BRIEF PATIENT DESCRIPTION: This is a 61 yo female who presented to [facility] ER 01/9 post fall at home and found to have multiple posterior right rib fractures in acute on chronic hypoxic hypercapnic respiratory failure secondary to ?right lung contusion  SIGNIFICANT EVENTS  01/9-Pt admitted to [facility] ICU post fall at home in ER found to have multiple posterior right rib fractures and in acute respiratory failure 01/10-PCCM consulted for acute on chronic hypoxic hypercapnic respiratory failure secondary to ?right lung contusion requiring Bipap  01/11-Pt transferred to 2A 01/12-Pt transferred back to ICU due to increased lethargy secondary to hypercapnia requiring continuous Bipap  STUDIES:  CT Chest 01/9>>Displaced posterior right eleventh and twelfth rib fractures. Nondisplaced right tenth rib fracture. Right greater than left lower lobe atelectasis. Emphysema with unchanged small lung nodules. CT Head 01/9>>No acute abnormality noted  HISTORY OF PRESENT ILLNESS:   This is a 61 yo female with a PMH of ESRD on Hemodialysis, Obesity, Hypertension, Hyperlipidemia, GERD, Depression, COPD, Home O2 at 2L via nasal canula, Bronchitis, Arthritis, Sleep Apnea, and Anemia.  She presented to [facility] ER on 01/9 following an unwitnessed fall at home with c/o right sided rib pain after falling on the edge of a small table.  In the ER the pt was somnolent but arousable and hypoxic with O2 sats 70-80% on 4L O2 via nasal canula.  A CT Chest on 01/9 revealed the pt had displaced posterior right eleventh and twelfth rib fractures and nondisplaced right tenth rib fracture.  She was admitted to ICU due to acute on chronic hypoxic hypercapnic respiratory failure secondary to ?right lung contusion due to multiple rib fractures following a fall.   PCCM consulted 01/10 due to acute encephalopathy secondary to hypercapnia requiring intermittent Bipap.    REVIEW OF SYSTEMS:  Unable to assess pt lethargic and unable to answer questions  SUBJECTIVE: Complained of right-sided chest pain early last night. Described pain as localized in the right chest wall, non-radicular, 7/10 in intensity, sharp, and worse with activity. Patient sustained a fall at home and her rib series showed displaced right posterolateral rib fractures. Denies associated dyspnea, nausea, vomiting and dizziness. Pain resolved with a dose of toradol. No other issues overnight. Transitioned to [place] this am and doing well  VITAL SIGNS: Temp:  [97.6 F (36.4 C)-98.6 F (37 C)] 97.6 F (36.4 C) (01/13 0200) Pulse Rate:  [69-87] 82 (01/13 0500) Resp:  [13-24] 19 (01/13 0500) BP: (110-166)/(56-107) 134/69 (01/13 0500) SpO2:  [88 %-100 %] 94 % (01/13 0500) FiO2 (%):  [32 %] 32 % (01/13 0248)  PHYSICAL EXAMINATION: General: AA female, well developed, well nourished, NAD Neuro: AAO X3, follows commands, no focal deficits HEENT: PERRLA, oral mucosa moist, neck is  Supple, no JVD Cardiovascular: NSR, s1s2, rrr, no M/R/G Lungs:  Diminished throughout, even, non labored on nasal canula Abdomen:  +BS x, soft, non tender, non distended Musculoskeletal:  Normal bulk and tone no edema Skin:  Intact no rashes or lesions   Recent Labs Lab 12/07/16 0510 12/07/16 0615  12/07/16 1829 12/07/16 2217 12/08/16 0454  NA 135 136  --   --   --  138  K 6.4* 6.4*  < > 4.2 5.0 4.6  CL 94* 94*  --   --   --  96*  CO2 27 28  --   --   --  29  BUN 65* 64*  --   --   --  40*  CREATININE 9.42* 9.50*  --   --   --  6.60*  GLUCOSE 138* 135*  --   --   --  186*  < > = values in this interval not displayed.  Recent Labs Lab 12/04/16 0310 12/07/16 0510 12/08/16 0454  HGB 14.2 12.8 12.2  HCT 43.1 40.2 37.9  WBC 8.4 10.7 7.0  PLT 298 259 246   Dg Chest Port 1 View  Result Date:  12/07/2016 CLINICAL DATA:  Acute respiratory failure. EXAM: PORTABLE CHEST 1 VIEW COMPARISON:  12/06/2016 FINDINGS: The cardio pericardial silhouette is enlarged. Right base atelectasis again noted. No pulmonary edema or substantial pleural effusion. Skin fold projects over the left lateral lung. Calcification at the right apex projected outside the lung on prior exam. The visualized bony structures of the thorax are intact. Telemetry leads overlie the chest. IMPRESSION: Cardiomegaly with progressive right basilar atelectasis. Electronically Signed   By: Misty Stanley M.D.   On: 12/07/2016 20:53    ASSESSMENT / PLAN: Acute on chronic hypoxic hypercapnic respiratory failure secondary to ?right lung contusion Right ribs fracture s/p fall Acute encephalopathy secondary to hypercapnia  Hx: COPD, Bronchitis, Chronic Home O2 at 2L, Sleep Apnea, Obesity, and ESRD on hemodialysis  P: Continue Bipap prn and transition to [place]  Continue bronchodilator therapy Intermittent CXR Prn ABG's Per surgery no need for surgical intervention for fractured displaced ribs Hemodialysis per nephrology recommendations  Avoid sedating medications Prn toradol and tylenol for pain management  Frequent reorientation  Lights on during the day  Early ambulation Aggressive pulmonary hygiene Patient is non-complaint with BiPAP and dietary restrictions. Could be transferred out of the ICU today if doing well on [place]  Plan of care discussed with Dr. Jonathon Jordan. [place] ANP-BC Pulmonary and [place] Pager [phone] or [phone]   PCCM ATTENDING ATTESTATION:  I have evaluated patient with the APP [place]ria Mane, reviewed database in its entirety and discussed care plan in detail.    Important exam findings: Much more alert No distress No wheezes Reg, no M No LE edema  Major problems addressed by PCCM team: Acute/chronic hypercarbic respiratory failure Severe  COPD ESRD   PLAN/REC: As above. Cont current COPD medical regimen Transfer to med-surg (again!) I think the key to keeping her from returning with recurrent decompensated hypercarbic failure is to begin mandatory nocturnal BiPAP and she should be discharged on this as well  PCCM will sign off. Please call if we can be of further assistance  Merton Border, MD PCCM service Mobile [phone] Pager [phone]

## 2016-12-08 NOTE — Progress Notes (Signed)
RT attempted to place pt on bipap at this time, pt states she has heart burn and does not want to wear bipap at this time. RN notified of pts heart burn

## 2016-12-08 NOTE — Clinical Social Work Note (Signed)
CSW received consult for nursing home placement. PT recommends HHPT. CSW not needed, but available for any other needs should they arise. CSW signing off.  Santiago Bumpers, MSW, Latanya Presser [phone]

## 2016-12-08 NOTE — Progress Notes (Signed)
Pt transferred to room 205.

## 2016-12-08 NOTE — Progress Notes (Signed)
Pt continued on 4 L per nasal cannula. Tolerating well. Complained of acid reflux earlier but has been eating hospital meal trays and food from home. Not compliant with 1200 ml fluid restrictions. Has a frequent productive cough. No complains of pain.

## 2016-12-08 NOTE — Progress Notes (Signed)
[place] at [place] NAME: Michele Nash    MR#:  [id number]  DATE OF BIRTH:  December 09, 1955  SUBJECTIVE:  CHIEF COMPLAINT:   Chief Complaint  Patient presents with  . Fall  . Rib Injury   Admitted after a fall with right chest pain. Found to have multiple posterior right rib fracture  Pt is lethargic still but arousable , mumbles and falling asleep quickly,not on Bipap    REVIEW OF SYSTEMS:    Review of Systems  Unable to perform ROS: Mental status change    DRUG ALLERGIES:   Allergies  Allergen Reactions  . Chantix [Varenicline]     hallucinations  . Sulfa Antibiotics Itching, Swelling, Rash and Other (See Comments)    Reaction:  Facial/body swelling     VITALS:  Blood pressure 134/69, pulse 82, temperature 97.6 F (36.4 C), temperature source Axillary, resp. rate 19, height 5\' 5"  (1.651 m), weight 57.4 kg (126 lb 9.6 oz), SpO2 94 %.  PHYSICAL EXAMINATION:   Physical Exam  GENERAL:  61 y.o.-year-old patient lying in the bed, BiPAP EYES: Pupils equal, round, reactive to light and accommodation. No scleral icterus. HEENT: Head atraumatic, normocephalic. Oropharynx and nasopharynx clear.  NECK:  Supple, no jugular venous distention. No thyroid enlargement, no tenderness.  LUNGS: Diminished air entry bilaterally, no wheezing  CARDIOVASCULAR: S1, S2 normal. No murmurs, rubs, or gallops.  ABDOMEN: Soft, nontender, nondistended. Bowel sounds present. No organomegaly or mass.  EXTREMITIES: No cyanosis, clubbing or edema b/l.    NEUROLOGIC: Lethargic, responding to verbal commands PSYCHIATRIC: The patient is drowsy SKIN: No obvious rash, lesion, or ulcer.   LABORATORY PANEL:   CBC  Recent Labs Lab 12/08/16 0454  WBC 7.0  HGB 12.2  HCT 37.9  PLT 246   ------------------------------------------------------------------------------------------------------------------ Chemistries   Recent Labs Lab 12/04/16 0354   12/08/16 0454  NA 140  < > 138  K 5.4*  < > 4.6  CL 98*  < > 96*  CO2 32  < > 29  GLUCOSE 112*  < > 186*  BUN 32*  < > 40*  CREATININE 6.67*  < > 6.60*  CALCIUM 8.7*  < > 8.6*  MG  --   --  1.7  AST 31  --   --   ALT 22  --   --   ALKPHOS 222*  --   --   BILITOT 0.6  --   --   < > = values in this interval not displayed. ------------------------------------------------------------------------------------------------------------------  Cardiac Enzymes No results for input(s): TROPONINI in the last 168 hours. ------------------------------------------------------------------------------------------------------------------  RADIOLOGY:  Dg Chest Port 1 View  Result Date: 12/07/2016 CLINICAL DATA:  Acute respiratory failure. EXAM: PORTABLE CHEST 1 VIEW COMPARISON:  12/06/2016 FINDINGS: The cardio pericardial silhouette is enlarged. Right base atelectasis again noted. No pulmonary edema or substantial pleural effusion. Skin fold projects over the left lateral lung. Calcification at the right apex projected outside the lung on prior exam. The visualized bony structures of the thorax are intact. Telemetry leads overlie the chest. IMPRESSION: Cardiomegaly with progressive right basilar atelectasis. Electronically Signed   By: Misty Stanley M.D.   On: 12/07/2016 20:53     ASSESSMENT AND PLAN:   * Acute on chronic respiratory failureWith hypoxia and hypercapnis due to  Multiple rib fractures BiPAP as needed Continue close monitoring Oxygen via nasal cannula Follow-up with pulmonology ABGS prn   * Acute encephalopathy due to CO2 narcosis. Place patient  on BiPAP as needed  * Right Posterolateral 10,11,12 ribs fracture No lung contusion or pneumothorax. Appreciate surgery input  * COPD. No exacerbation Nebs and inhalers  * End-stage renal disease on hemodialysis  Patient had hemodialysis 12/07/16  and potassium better; potassium 4.6 today Appreciate nephrology help with HD  *  DVT prophylaxis with heparin  All the records are reviewed and case discussed with Care Management/Social Workerr. Management plans discussed with the patient, family and they are in agreement.  CODE STATUS: FULL CODE   TOTAL TIME TAKING CARE OF THIS PATIENT: 35 minutes.   POSSIBLE D/C IN 2-3 DAYS, DEPENDING ON CLINICAL CONDITION.  Nicholes Mango M.D on 12/08/2016 at 8:18 AM  Between 7am to 6pm - Pager - [phone]  After 6pm go to [email or web address] - password [email or web address] [place] [facility]  Office  [phone]  CC: Primary care physician; [name]  Note: This dictation was prepared with Dragon dictation along with smaller phrase technology. Any transcriptional errors that result from this process are unintentional.

## 2016-12-09 LAB — CULTURE, BLOOD (ROUTINE X 2)
Culture: NO GROWTH
Culture: NO GROWTH

## 2016-12-09 MED ORDER — FLUCONAZOLE 50 MG PO TABS
150.0000 mg | ORAL_TABLET | Freq: Once | ORAL | Status: AC
  Administered 2016-12-09: 150 mg via ORAL
  Filled 2016-12-09: qty 1

## 2016-12-09 NOTE — Progress Notes (Signed)
Asked patient if she wanted to go back on bipap to rest but patient stated not at this time.

## 2016-12-09 NOTE — Progress Notes (Signed)
[facility] NAME: Michele Nash    MR#:  [id number]  DATE OF BIRTH:  07-29-1956  SUBJECTIVE:  CHIEF COMPLAINT:   Chief Complaint  Patient presents with  . Fall  . Rib Injury   Admitted after a fall with right chest pain. Found to have multiple posterior right rib fracture  Pt  Was  Placed on nocturnal bipap last night. Patient is more awake and alert today  REVIEW OF SYSTEMS:    Review of Systems  Constitutional: Negative for chills, fever and weight loss.  HENT: Negative for congestion, ear discharge and ear pain.   Respiratory: Negative for cough and sputum production.   Cardiovascular: Negative for chest pain, palpitations and orthopnea.  Gastrointestinal: Negative for abdominal pain, nausea and vomiting.  Genitourinary: Negative for frequency, hematuria and urgency.  Musculoskeletal: Negative for joint pain.  Neurological: Negative for tingling and headaches.  Psychiatric/Behavioral: Negative for hallucinations. The patient is not nervous/anxious.     DRUG ALLERGIES:   Allergies  Allergen Reactions  . Chantix [Varenicline]     hallucinations  . Sulfa Antibiotics Itching, Swelling, Rash and Other (See Comments)    Reaction:  Facial/body swelling     VITALS:  Blood pressure (!) 170/86, pulse 70, temperature 98 F (36.7 C), temperature source Oral, resp. rate 19, height 5\' 5"  (1.651 m), weight 57.4 kg (126 lb 9.6 oz), SpO2 97 %.  PHYSICAL EXAMINATION:   Physical Exam  GENERAL:  61 y.o.-year-old patient lying in the bed, BiPAP EYES: Pupils equal, round, reactive to light and accommodation. No scleral icterus. HEENT: Head atraumatic, normocephalic. Oropharynx and nasopharynx clear.  NECK:  Supple, no jugular venous distention. No thyroid enlargement, no tenderness.  LUNGS: Diminished air entry bilaterally, no wheezing  CARDIOVASCULAR: S1, S2 normal. No murmurs, rubs, or gallops.   antrior chest wall is tender to  palpation ABDOMEN: Soft, nontender, nondistended. Bowel sounds present. No organomegaly or mass.  EXTREMITIES: No cyanosis, clubbing or edema b/l.    NEUROLOGIC: Awake, alert and oriented 3 PSYCHIATRIC: The patient is oriented 3 SKIN: No obvious rash, lesion, or ulcer.   LABORATORY PANEL:   CBC  Recent Labs Lab 12/08/16 0454  WBC 7.0  HGB 12.2  HCT 37.9  PLT 246   ------------------------------------------------------------------------------------------------------------------ Chemistries   Recent Labs Lab 12/04/16 0354  12/08/16 0454  12/08/16 1753  NA 140  < > 138  --   --   K 5.4*  < > 4.6  < > 5.1  CL 98*  < > 96*  --   --   CO2 32  < > 29  --   --   GLUCOSE 112*  < > 186*  --   --   BUN 32*  < > 40*  --   --   CREATININE 6.67*  < > 6.60*  --   --   CALCIUM 8.7*  < > 8.6*  --   --   MG  --   --  1.7  --   --   AST 31  --   --   --   --   ALT 22  --   --   --   --   ALKPHOS 222*  --   --   --   --   BILITOT 0.6  --   --   --   --   < > = values in this interval not displayed. ------------------------------------------------------------------------------------------------------------------  Cardiac Enzymes  No results for input(s): TROPONINI in the last 168 hours. ------------------------------------------------------------------------------------------------------------------  RADIOLOGY:  Dg Chest Port 1 View  Result Date: 12/07/2016 CLINICAL DATA:  Acute respiratory failure. EXAM: PORTABLE CHEST 1 VIEW COMPARISON:  12/06/2016 FINDINGS: The cardio pericardial silhouette is enlarged. Right base atelectasis again noted. No pulmonary edema or substantial pleural effusion. Skin fold projects over the left lateral lung. Calcification at the right apex projected outside the lung on prior exam. The visualized bony structures of the thorax are intact. Telemetry leads overlie the chest. IMPRESSION: Cardiomegaly with progressive right basilar atelectasis. Electronically  Signed   By: Misty Stanley M.D.   On: 12/07/2016 20:53     ASSESSMENT AND PLAN:   * Acute on chronic respiratory failureWith hypoxia and hypercapnis due to  Multiple rib fractures BiPAP daily at bedtime Consult case management regarding nocturnal BiPAP arrangement Continue close monitoring Oxygen via nasal cannula Follow-up with pulmonology ABGS prn   * Acute encephalopathy due to CO2 narcosis. Resolved continue nocturnal BiPAP   * Right Posterolateral 10,11,12 ribs fracture No lung contusion or pneumothorax. Appreciate surgery input, no interventions needed at this time  * COPD. No exacerbation Nebs and inhalers  * End-stage renal disease on hemodialysis  Patient had hemodialysis 12/07/16  and potassium better; potassium 4.6 today Appreciate nephrology help with HD  * DVT prophylaxis with heparin  PT consult  All the records are reviewed and case discussed with Care Management/Social Workerr. Management plans discussed with the patient, family and they are in agreement.  CODE STATUS: FULL CODE   TOTAL TIME TAKING CARE OF THIS PATIENT: 35 minutes.   POSSIBLE D/C IN am  DAYS, DEPENDING ON CLINICAL CONDITION.  Nicholes Mango M.D on 12/09/2016 at 12:00 PM  Between 7am to 6pm - Pager - [phone]  After 6pm go to [email or web address] - password [email or web address] [facility]  Office  [phone]  CC: Primary care physician; [name]  Note: This dictation was prepared with Dragon dictation along with smaller phrase technology. Any transcriptional errors that result from this process are unintentional.

## 2016-12-09 NOTE — Evaluation (Signed)
Physical Therapy Re-Evaluation Patient Details Name: Michele Nash MRN: [id number] DOB: 08-30-1956 Today's Date: 12/09/2016   History of Present Illness  pt is a pleasant 61 yo female admitted to hospital due to fall and found to have acute respiratory failure, fall resulted in multiple rib fracures on right side (posterior lateral: 6,7 and Post: 11,12). She has a history of falls, anemia, apnea, arthritis, bronchitis, chronic kidney disease, COPD, depression and HTN. Patient just recently moved back to floor from CCU for critical potassium levels and need for O2 monitoring/BiPap  Clinical Impression  Patient re-evaluated after she had temporarily gone to CCU, now she has returned to floor. Patient has been independent at home prior to this admission, she was able to transfer in and out of bed without assistance as well as stand and ambulate through hallways with no loss of balance, no desaturation of O2 (>92% on 2L throughout). She was able to ascend/descend 4 steps with no loss of balance or fatigue. She appears to be returning to her baseline, and would benefit from additional acute PT services to keep her mobile, but does not appear to have any need for HHPT at this time.     Follow Up Recommendations No PT follow up    Equipment Recommendations       Recommendations for Other Services       Precautions / Restrictions Precautions Precautions: Fall Restrictions Weight Bearing Restrictions: No      Mobility  Bed Mobility Overal bed mobility: Independent             General bed mobility comments: No deficits in bed mobility noted.   Transfers Overall transfer level: Independent   Transfers: Sit to/from Stand           General transfer comment: No deficits or assistance in sit to stand needed.   Ambulation/Gait Ambulation/Gait assistance: Supervision Ambulation Distance (Feet): 300 Feet   Gait Pattern/deviations: WFL(Within Functional Limits)   Gait velocity  interpretation: at or above normal speed for age/gender General Gait Details: O2 sats remained above 92% on 2L, no loss of balance even with changes of direction or turning.   Stairs Stairs: Yes Stairs assistance: Modified independent (Device/Increase time) Stair Management: Two rails Number of Stairs: 5 General stair comments: Reciprocal gait pattern noted, no loss of balance   Wheelchair Mobility    Modified Rankin (Stroke Patients Only)       Balance Overall balance assessment: No apparent balance deficits (not formally assessed);History of Falls Sitting-balance support: No upper extremity supported Sitting balance-Leahy Scale: Normal     Standing balance support: No upper extremity supported Standing balance-Leahy Scale: Good                               Pertinent Vitals/Pain Pain Assessment:  (Patient reports mild chronic L ankle pain, but does not rate) Pain Intervention(s): Monitored during session    Home Living Family/patient expects to be discharged to:: Private residence Living Arrangements: Spouse/significant other Available Help at Discharge: Family Type of Home: Apartment Home Access: Stairs to enter Entrance Stairs-Rails: Chemical engineer of Steps: 14 Home Layout: One level Home Equipment: Cane - single point Additional Comments: husband works 2pm-10pm, pt can go to her friend's or sister's while her husband is at work    Prior Function Level of Independence: Independent         Comments: requires O2 at baseline 3L     Hand  Dominance   Dominant Hand: Right    Extremity/Trunk Assessment   Upper Extremity Assessment Upper Extremity Assessment: Overall WFL for tasks assessed    Lower Extremity Assessment Lower Extremity Assessment: Overall WFL for tasks assessed       Communication   Communication: No difficulties  Cognition Arousal/Alertness: Awake/alert Behavior During Therapy: WFL for tasks  assessed/performed;Impulsive Overall Cognitive Status: Within Functional Limits for tasks assessed                      General Comments      Exercises     Assessment/Plan    PT Assessment Patient needs continued PT services  PT Problem List Decreased balance;Decreased mobility;Cardiopulmonary status limiting activity;Decreased knowledge of use of DME;Decreased activity tolerance          PT Treatment Interventions Gait training;Stair training;Functional mobility training;DME instruction;Therapeutic activities;Therapeutic exercise;Balance training;Patient/family education    PT Goals (Current goals can be found in the Care Plan section)  Acute Rehab PT Goals Patient Stated Goal: Return home PT Goal Formulation: With patient Time For Goal Achievement: 12/23/16 Potential to Achieve Goals: Good    Frequency Min 2X/week   Barriers to discharge        Co-evaluation               End of Session Equipment Utilized During Treatment: Gait belt;Oxygen Activity Tolerance: Patient tolerated treatment well Patient left: with call bell/phone within reach;in bed;with bed alarm set Nurse Communication: Mobility status         Time: [id number] PT Time Calculation (min) (ACUTE ONLY): 17 min   Charges:   PT Evaluation $PT Re-evaluation: 1 Procedure     PT G Codes:       Kerman Passey, PT, DPT    12/09/2016, 1:43 PM

## 2016-12-09 NOTE — Progress Notes (Signed)
[facility]  ROUNDING NOTE   Subjective:  Patient awake and alert this a.m. She is been recommended to use nocturnal BiPAP but apparently declined last night. Resting comfortably in bed this morning.  Objective:  Vital signs in last 24 hours:  Temp:  [97.7 F (36.5 C)-98.2 F (36.8 C)] 98 F (36.7 C) (01/14 0548) Pulse Rate:  [70-90] 70 (01/14 0800) Resp:  [17-23] 19 (01/14 0548) BP: (114-170)/(46-98) 170/86 (01/14 0800) SpO2:  [91 %-100 %] 97 % (01/14 0800)  Weight change:  Filed Weights   12/04/16 0231 12/04/16 1240 12/06/16 2345  Weight: 61.2 kg (135 lb) 56 kg (123 lb 7.3 oz) 57.4 kg (126 lb 9.6 oz)    Intake/Output: I/O last 3 completed shifts: In: 702.7 [P.O.:702.7] Out: -    Intake/Output this shift:  Total I/O In: 240 [P.O.:240] Out: -   Physical Exam: General: No acute distress   Head: Normocephalic, atraumatic. Moist oral mucosal membranes  Eyes: Anicteric  Neck: Supple, trachea midline  Lungs:  Scattered rhonchi, normal effort   Heart: S1S2 no rubs  Abdomen:  Soft, nontener, bowel sounds present  Extremities: No peripheral edema.  Neurologic: Nonfocal, moving all four extremities  Skin: No lesions  Access: LUE AVF    Basic Metabolic Panel:  Recent Labs Lab 12/04/16 0354 12/05/16 1526 12/07/16 0510 12/07/16 0615  12/07/16 1358  12/07/16 2217 12/08/16 0454 12/08/16 1046 12/08/16 1417 12/08/16 1753  NA 140  --  135 136  --   --   --   --  138  --   --   --   K 5.4*  --  6.4* 6.4*  < > 5.3*  < > 5.0 4.6 5.3* 5.3* 5.1  CL 98*  --  94* 94*  --   --   --   --  96*  --   --   --   CO2 32  --  27 28  --   --   --   --  29  --   --   --   GLUCOSE 112*  --  138* 135*  --   --   --   --  186*  --   --   --   BUN 32*  --  65* 64*  --   --   --   --  40*  --   --   --   CREATININE 6.67*  --  9.42* 9.50*  --   --   --   --  6.60*  --   --   --   CALCIUM 8.7*  --  8.1* 8.1*  --   --   --   --  8.6*  --   --   --   MG  --   --   --   --    --   --   --   --  1.7  --   --   --   PHOS  --  6.5*  --   --   --  6.5*  --   --  5.6*  --   --   --   < > = values in this interval not displayed.  Liver Function Tests:  Recent Labs Lab 12/04/16 0354  AST 31  ALT 22  ALKPHOS 222*  BILITOT 0.6  PROT 7.9  ALBUMIN 3.9   No results for input(s): LIPASE, AMYLASE in the last 168 hours. No results for input(s): AMMONIA  in the last 168 hours.  CBC:  Recent Labs Lab 12/04/16 0310 12/07/16 0510 12/08/16 0454  WBC 8.4 10.7 7.0  NEUTROABS 6.0  --   --   HGB 14.2 12.8 12.2  HCT 43.1 40.2 37.9  MCV 95.2 98.9 98.4  PLT 298 259 246    Cardiac Enzymes: No results for input(s): CKTOTAL, CKMB, CKMBINDEX, TROPONINI in the last 168 hours.  BNP: Invalid input(s): POCBNP  CBG:  Recent Labs Lab 12/04/16 1241 12/07/16 1024  GLUCAP 117* 267*    Microbiology: Results for orders placed or performed during the hospital encounter of 12/04/16  Culture, blood (Routine x 2)     Status: None   Collection Time: 12/04/16  3:11 AM  Result Value Ref Range Status   Specimen Description BLOOD R WRIST  Final   Special Requests   Final    BOTTLES DRAWN AEROBIC AND ANAEROBIC AER 5ML ANA 2ML   Culture NO GROWTH 5 DAYS  Final   Report Status 12/09/2016 FINAL  Final  Culture, blood (Routine x 2)     Status: None   Collection Time: 12/04/16  3:37 AM  Result Value Ref Range Status   Specimen Description BLOOD R FA  Final   Special Requests   Final    BOTTLES DRAWN AEROBIC AND ANAEROBIC AER 8ML ANA 6ML   Culture NO GROWTH 5 DAYS  Final   Report Status 12/09/2016 FINAL  Final  Urine culture     Status: None   Collection Time: 12/04/16  4:46 AM  Result Value Ref Range Status   Specimen Description URINE, RANDOM  Final   Special Requests NONE  Final   Culture NO GROWTH Performed at [facility]   Final   Report Status 12/05/2016 FINAL  Final    Coagulation Studies: No results for input(s): LABPROT, INR in the last 72  hours.  Urinalysis: No results for input(s): COLORURINE, LABSPEC, PHURINE, GLUCOSEU, HGBUR, BILIRUBINUR, KETONESUR, PROTEINUR, UROBILINOGEN, NITRITE, LEUKOCYTESUR in the last 72 hours.  Invalid input(s): APPERANCEUR    Imaging: Dg Chest Port 1 View  Result Date: 12/07/2016 CLINICAL DATA:  Acute respiratory failure. EXAM: PORTABLE CHEST 1 VIEW COMPARISON:  12/06/2016 FINDINGS: The cardio pericardial silhouette is enlarged. Right base atelectasis again noted. No pulmonary edema or substantial pleural effusion. Skin fold projects over the left lateral lung. Calcification at the right apex projected outside the lung on prior exam. The visualized bony structures of the thorax are intact. Telemetry leads overlie the chest. IMPRESSION: Cardiomegaly with progressive right basilar atelectasis. Electronically Signed   By: Misty Stanley M.D.   On: 12/07/2016 20:53     Medications:    . amLODipine  10 mg Oral Daily  . budesonide (PULMICORT) nebulizer solution  0.25 mg Nebulization BID  . calcium acetate  1,334 mg Oral TID WC  . carvedilol  12.5 mg Oral BID WC  . cefTRIAXone  1 g Intravenous Q24H  . chlorhexidine  15 mL Mouth Rinse BID  . cinacalcet  60 mg Oral Q breakfast  . feeding supplement (ENSURE ENLIVE)  237 mL Oral TID BM  . furosemide  40 mg Oral Q T,Th,S,Su  . heparin  5,000 Units Subcutaneous Q8H  . ipratropium-albuterol  3 mL Nebulization QID  . losartan  100 mg Oral Daily  . mouth rinse  15 mL Mouth Rinse q12n4p  . pantoprazole  40 mg Oral Daily  . sertraline  50 mg Oral Daily   acetaminophen **OR** [DISCONTINUED] acetaminophen, ALPRAZolam, alteplase, alum &  mag hydroxide-simeth, bisacodyl, fluticasone, heparin, hydrALAZINE, ketorolac, lidocaine (PF), lidocaine-prilocaine, lidocaine-prilocaine, ondansetron **OR** ondansetron (ZOFRAN) IV, pentafluoroprop-tetrafluoroeth, senna-docusate, traMADol  Assessment/ Plan:  61 y.o. female with hypertension, hyperlipidemia, diastolic heart  failure, hypertension, COPD, tobacco abuse, AOCD, SHPTH, LUE AVF, ESRD 02/2013, cocaine abuse, hx of recurrent falls, readmission for fall/posterior right rib fracture 12/05/16  [facility]/[place] [place]/MWF  1. ESRD on HD MWF. No urgent indication for dialysis today. Patient will need hemodialysis tomorrow per her usual schedule.  2. Hyperkalemia. Improved with dialysis. Continue to monitor serum potassium.  3. Anemia of chronic kidney disease. Hemoglobin greater than 12 at the moment. Therefore we will continue to hold Epogen.  4. Secondary hyperparathyroidism. Last phosphorus was 5.6. Continue calcium acetate as ordered. Repeat phosphorus tomorrow.  5. Acute respiratory failure.  Patient likely having nocturnal hypercarbia. She has been advised to use nocturnal BiPAP. This will need to be ordered for use as an outpatient. Patient was offered nocturnal BiPAP last night but declined it. We counseled the patient on the importance of wearing the nocturnal BiPAP as she appears to have hallucinations at times during the day which is likely cubital to the underlying hypercarbia.  LOS: 5 Michele Nash 1/14/201810:49 AM

## 2016-12-10 MED ORDER — PRO-STAT SUGAR FREE PO LIQD
30.0000 mL | Freq: Every day | ORAL | Status: DC
  Administered 2016-12-10 – 2016-12-11 (×2): 30 mL via ORAL

## 2016-12-10 NOTE — Progress Notes (Signed)
HD TX end

## 2016-12-10 NOTE — Progress Notes (Signed)
Hemodialysis treatment started. 

## 2016-12-10 NOTE — Progress Notes (Signed)
Hemodialysis completed. 

## 2016-12-10 NOTE — Progress Notes (Signed)
Pre-hd tx 

## 2016-12-10 NOTE — Progress Notes (Signed)
[facility]  ROUNDING NOTE   Subjective:   Seen and examined on hemodialysis. Tolerating treatment well. UF goal of 2 litres    HEMODIALYSIS FLOWSHEET:  Blood Flow Rate (mL/min): 400 mL/min Arterial Pressure (mmHg): -180 mmHg Venous Pressure (mmHg): 240 mmHg Transmembrane Pressure (mmHg): 50 mmHg Ultrafiltration Rate (mL/min): 670 mL/min Dialysate Flow Rate (mL/min): 800 ml/min Conductivity: Machine : 14 Conductivity: Machine : 14 Dialysis Fluid Bolus: Normal Saline Bolus Amount (mL): 250 mL Dialysate Change: 2K Intra-Hemodialysis Comments: Tx started without complications.Access secured/visible.Dialyzing in bed.    Objective:  Vital signs in last 24 hours:  Temp:  [97.4 F (36.3 C)-98.2 F (36.8 C)] 97.7 F (36.5 C) (01/15 0920) Pulse Rate:  [64-72] 71 (01/15 0930) Resp:  [16-21] 20 (01/15 0930) BP: (127-168)/(64-101) 163/72 (01/15 0930) SpO2:  [97 %-100 %] 100 % (01/15 0930) FiO2 (%):  [28 %] 28 % (01/15 0748) Weight:  [62.1 kg (136 lb 12.8 oz)] 62.1 kg (136 lb 12.8 oz) (01/15 0920)  Weight change:  Filed Weights   12/06/16 2345 12/10/16 0545 12/10/16 0920  Weight: 57.4 kg (126 lb 9.6 oz) 62.1 kg (136 lb 12.8 oz) 62.1 kg (136 lb 12.8 oz)    Intake/Output: I/O last 3 completed shifts: In: 60 [P.O.:840; IV Piggyback:50] Out: -    Intake/Output this shift:  No intake/output data recorded.  Physical Exam: General: No acute distress   Head: Normocephalic, atraumatic. Moist oral mucosal membranes  Eyes: Anicteric  Neck: Supple, trachea midline  Lungs:  clear, normal effort, 2 L [place]   Heart: S1S2 no rubs  Abdomen:  Soft, nontener, bowel sounds present  Extremities: No peripheral edema.  Neurologic: Nonfocal, moving all four extremities  Skin: No lesions  Access: LUE AVF    Basic Metabolic Panel:  Recent Labs Lab 12/04/16 0354 12/05/16 1526 12/07/16 0510 12/07/16 0615  12/07/16 1358  12/07/16 2217 12/08/16 0454 12/08/16 1046  12/08/16 1417 12/08/16 1753  NA 140  --  135 136  --   --   --   --  138  --   --   --   K 5.4*  --  6.4* 6.4*  < > 5.3*  < > 5.0 4.6 5.3* 5.3* 5.1  CL 98*  --  94* 94*  --   --   --   --  96*  --   --   --   CO2 32  --  27 28  --   --   --   --  29  --   --   --   GLUCOSE 112*  --  138* 135*  --   --   --   --  186*  --   --   --   BUN 32*  --  65* 64*  --   --   --   --  40*  --   --   --   CREATININE 6.67*  --  9.42* 9.50*  --   --   --   --  6.60*  --   --   --   CALCIUM 8.7*  --  8.1* 8.1*  --   --   --   --  8.6*  --   --   --   MG  --   --   --   --   --   --   --   --  1.7  --   --   --   PHOS  --  6.5*  --   --   --  6.5*  --   --  5.6*  --   --   --   < > = values in this interval not displayed.  Liver Function Tests:  Recent Labs Lab 12/04/16 0354  AST 31  ALT 22  ALKPHOS 222*  BILITOT 0.6  PROT 7.9  ALBUMIN 3.9   No results for input(s): LIPASE, AMYLASE in the last 168 hours. No results for input(s): AMMONIA in the last 168 hours.  CBC:  Recent Labs Lab 12/04/16 0310 12/07/16 0510 12/08/16 0454  WBC 8.4 10.7 7.0  NEUTROABS 6.0  --   --   HGB 14.2 12.8 12.2  HCT 43.1 40.2 37.9  MCV 95.2 98.9 98.4  PLT 298 259 246    Cardiac Enzymes: No results for input(s): CKTOTAL, CKMB, CKMBINDEX, TROPONINI in the last 168 hours.  BNP: Invalid input(s): POCBNP  CBG:  Recent Labs Lab 12/04/16 1241 12/07/16 1024  GLUCAP 117* 267*    Microbiology: Results for orders placed or performed during the hospital encounter of 12/04/16  Culture, blood (Routine x 2)     Status: None   Collection Time: 12/04/16  3:11 AM  Result Value Ref Range Status   Specimen Description BLOOD R WRIST  Final   Special Requests   Final    BOTTLES DRAWN AEROBIC AND ANAEROBIC AER 5ML ANA 2ML   Culture NO GROWTH 5 DAYS  Final   Report Status 12/09/2016 FINAL  Final  Culture, blood (Routine x 2)     Status: None   Collection Time: 12/04/16  3:37 AM  Result Value Ref Range Status    Specimen Description BLOOD R FA  Final   Special Requests   Final    BOTTLES DRAWN AEROBIC AND ANAEROBIC AER 8ML ANA 6ML   Culture NO GROWTH 5 DAYS  Final   Report Status 12/09/2016 FINAL  Final  Urine culture     Status: None   Collection Time: 12/04/16  4:46 AM  Result Value Ref Range Status   Specimen Description URINE, RANDOM  Final   Special Requests NONE  Final   Culture NO GROWTH Performed at [facility]   Final   Report Status 12/05/2016 FINAL  Final    Coagulation Studies: No results for input(s): LABPROT, INR in the last 72 hours.  Urinalysis: No results for input(s): COLORURINE, LABSPEC, PHURINE, GLUCOSEU, HGBUR, BILIRUBINUR, KETONESUR, PROTEINUR, UROBILINOGEN, NITRITE, LEUKOCYTESUR in the last 72 hours.  Invalid input(s): APPERANCEUR    Imaging: No results found.   Medications:    . amLODipine  10 mg Oral Daily  . budesonide (PULMICORT) nebulizer solution  0.25 mg Nebulization BID  . calcium acetate  1,334 mg Oral TID WC  . carvedilol  12.5 mg Oral BID WC  . cefTRIAXone  1 g Intravenous Q24H  . chlorhexidine  15 mL Mouth Rinse BID  . cinacalcet  60 mg Oral Q breakfast  . feeding supplement (ENSURE ENLIVE)  237 mL Oral TID BM  . furosemide  40 mg Oral Q T,Th,S,Su  . heparin  5,000 Units Subcutaneous Q8H  . ipratropium-albuterol  3 mL Nebulization QID  . losartan  100 mg Oral Daily  . mouth rinse  15 mL Mouth Rinse q12n4p  . pantoprazole  40 mg Oral Daily  . sertraline  50 mg Oral Daily   acetaminophen **OR** [DISCONTINUED] acetaminophen, ALPRAZolam, alteplase, alum & mag hydroxide-simeth, bisacodyl, fluticasone, heparin, hydrALAZINE, ketorolac, lidocaine (PF), lidocaine-prilocaine, lidocaine-prilocaine, ondansetron **  OR** ondansetron (ZOFRAN) IV, pentafluoroprop-tetrafluoroeth, senna-docusate, traMADol  Assessment/ Plan:  61 y.o.black female with hypertension, hyperlipidemia, diastolic heart failure, hypertension, COPD, tobacco abuse, anemia,  SHPTH, cocaine abuse,   [facility]/ [address] /MWF/ left armAVF  1. ESRD on HD MWF with hyperkalemia: seen and examined on hemodialysis. Tolerating treatment well.  2. Hypertension: at goal.  - carvedilol, amlodipine and furosemide.   3. Anemia of chronic kidney disease. Hemoglobin 12.2 T - Hold ESA  4. Secondary hyperparathyroidism with hyperphosphatemia:  - Calcium acetate with meals - Cinacalcet   LOS: 6 Michele Nash 1/15/201810:00 AM

## 2016-12-10 NOTE — Progress Notes (Signed)
Post HD  

## 2016-12-10 NOTE — Progress Notes (Signed)
PT Cancellation Note  Patient Details Name: Michele Nash MRN: [id number] DOB: [birth year]   Cancelled Treatment:    Reason Eval/Treat Not Completed: Patient at procedure or test/unavailable   Attempted x 2 this am.  At dialysis.  Will continue as appropriate.   Chesley Noon 12/10/2016, 11:32 AM

## 2016-12-10 NOTE — Progress Notes (Signed)
Post hemodialysis treatment. 

## 2016-12-10 NOTE — Care Management (Signed)
HD info faxed to [facility] HD liaison  

## 2016-12-10 NOTE — Progress Notes (Signed)
Nutrition Follow-up  DOCUMENTATION CODES:   Not applicable  INTERVENTION:  Discontinued Ensure Enlive.  Provide Pro-Stat 30 ml once daily, each supplement provides 100 kcal and 15 grams of protein.  Encouraged use of phosphate binder with meals and snacks. Also encouraged ongoing adequate intake of calories and protein with meals.   NUTRITION DIAGNOSIS:   Inadequate oral intake related to acute illness as evidenced by meal completion < 25% (inability to take po).  Improved.  GOAL:   Patient will meet greater than or equal to 90% of their needs  Met.  MONITOR:   PO intake, Supplement acceptance, Labs, Weight trends  REASON FOR ASSESSMENT:   Malnutrition Screening Tool    ASSESSMENT:   62 yo female admitted s/p fall with rib fractures, acute on chronic respiratory failure. Pt with hx of COPD, chronic respiratory failures, ESRD on HD, HTN, HL.   Spoke with patient during HD session. She reports her appetite is much better. She has been completing 75-100% of meals and also eats part of the bedtime snack each night. Per chart patient did not drink any Ensure Enlive yesterday. Nephrologist reports patient is on Liquacel as protein supplement with outpatient HD (provides 90 kcal, 16 grams protein).   Meal Completion: 75-100% per chart and patient report In the past 24 hours patient has had 1923 kcal (100% minimum estimated needs) and 73 grams of protein (91% minimum estimated needs).   Medications reviewed and include: calcium acetate 1334 mg TID with meals, ceftriaxone, Lasix 40 mg every T-Th-S-Su, pantoprazole.  Labs reviewed (results from 1/13): Potassium 4.6-5.3, Chloride 96, Glucose 186, BUN 40, Creatinine 6.6, Phosphorus 5.6.   Diet Order:  Diet renal with fluid restriction Fluid restriction: 1200 mL Fluid; Room service appropriate? Yes; Fluid consistency: Thin  Skin:  Reviewed, no issues  Last BM:  12/09/2016  Height:   Ht Readings from Last 1 Encounters:   12/06/16 _0  (1.651 m)    Weight:   Wt Readings from Last 1 Encounters:  12/10/16 136 lb 12.8 oz (62.1 kg)    Ideal Body Weight:     BMI:  Body mass index is 22.76 kg/m.  Estimated Nutritional Needs:   Kcal:  1680-1960 kcals  Protein:  80-98 g   Fluid:  1000 mL plus UOP  EDUCATION NEEDS:   No education needs identified at this time  Willey Blade, MS, RD, LDN Pager: [phone] After Hours Pager: [phone]

## 2016-12-10 NOTE — Progress Notes (Signed)
[facility] [place] NAME: Michele Nash    MR#:  [id number]  DATE OF BIRTH:  March 25, 1956  SUBJECTIVE:  CHIEF COMPLAINT:   Chief Complaint  Patient presents with  . Fall  . Rib Injury   Admitted after a fall with right chest pain. Found to have multiple posterior right rib fracture  Pt Responded very well to nocturnal BiPAP plan is to discharge her home with nocturnal BiPAP Patient denies any shortness of breath. Seen and examined her in dialysis unit today  REVIEW OF SYSTEMS:    Review of Systems  Constitutional: Negative for chills, fever and weight loss.  HENT: Negative for congestion, ear discharge and ear pain.   Respiratory: Negative for cough and sputum production.   Cardiovascular: Negative for chest pain, palpitations and orthopnea.  Gastrointestinal: Negative for abdominal pain, nausea and vomiting.  Genitourinary: Negative for frequency, hematuria and urgency.  Musculoskeletal: Negative for joint pain.  Neurological: Negative for tingling and headaches.  Psychiatric/Behavioral: Negative for hallucinations. The patient is not nervous/anxious.     DRUG ALLERGIES:   Allergies  Allergen Reactions  . Chantix [Varenicline]     hallucinations  . Sulfa Antibiotics Itching, Swelling, Rash and Other (See Comments)    Reaction:  Facial/body swelling     VITALS:  Blood pressure (!) 164/87, pulse 88, temperature 98.1 F (36.7 C), temperature source Oral, resp. rate 15, height 5\' 5"  (1.651 m), weight 62.1 kg (136 lb 12.8 oz), SpO2 96 %.  PHYSICAL EXAMINATION:   Physical Exam  GENERAL:  61 y.o.-year-old patient lying in the bed, BiPAP EYES: Pupils equal, round, reactive to light and accommodation. No scleral icterus. HEENT: Head atraumatic, normocephalic. Oropharynx and nasopharynx clear.  NECK:  Supple, no jugular venous distention. No thyroid enlargement, no tenderness.  LUNGS: Diminished air entry bilaterally, no wheezing   CARDIOVASCULAR: S1, S2 normal. No murmurs, rubs, or gallops.   antrior chest wall is tender to palpation ABDOMEN: Soft, nontender, nondistended. Bowel sounds present. No organomegaly or mass.  EXTREMITIES: No cyanosis, clubbing or edema b/l.    NEUROLOGIC: Awake, alert and oriented 3 PSYCHIATRIC: The patient is oriented 3 SKIN: No obvious rash, lesion, or ulcer.   LABORATORY PANEL:   CBC  Recent Labs Lab 12/08/16 0454  WBC 7.0  HGB 12.2  HCT 37.9  PLT 246   ------------------------------------------------------------------------------------------------------------------ Chemistries   Recent Labs Lab 12/04/16 0354  12/08/16 0454  12/08/16 1753  NA 140  < > 138  --   --   K 5.4*  < > 4.6  < > 5.1  CL 98*  < > 96*  --   --   CO2 32  < > 29  --   --   GLUCOSE 112*  < > 186*  --   --   BUN 32*  < > 40*  --   --   CREATININE 6.67*  < > 6.60*  --   --   CALCIUM 8.7*  < > 8.6*  --   --   MG  --   --  1.7  --   --   AST 31  --   --   --   --   ALT 22  --   --   --   --   ALKPHOS 222*  --   --   --   --   BILITOT 0.6  --   --   --   --   < > =  values in this interval not displayed. ------------------------------------------------------------------------------------------------------------------  Cardiac Enzymes No results for input(s): TROPONINI in the last 168 hours. ------------------------------------------------------------------------------------------------------------------  RADIOLOGY:  No results found.   ASSESSMENT AND PLAN:   * Acute on chronic respiratory failureWith hypoxia and hypercapnis due to  Multiple rib fractures BiPAP daily at bedtime,Case management to arrange nocturnal BiPAP Oxygen via nasal cannula Follow-up with pulmonology [facility] prn  Patient needs outpatient sleep study and outpatient pulmonology follow-up after discharge  * Acute encephalopathy due to CO2 narcosis. Resolved continue nocturnal BiPAP   * Right Posterolateral 10,11,12  ribs fracture No lung contusion or pneumothorax. Appreciate surgery input, no interventions needed at this time  * COPD. No exacerbation Nebs and inhalers  * End-stage renal disease on hemodialysis  Patient had hemodialysis 12/07/16  and potassium better; potassium 4.6 today Appreciate nephrology help with HD. Patient had hemodialysis today 12/10/2016  * DVT prophylaxis with heparin  PT consult  All the records are reviewed and case discussed with Care Management/Social Workerr. Management plans discussed with the patient, family and they are in agreement.  CODE STATUS: FULL CODE   TOTAL TIME TAKING CARE OF THIS PATIENT: 33 minutes.   POSSIBLE D/C IN am  DAYS, DEPENDING ON CLINICAL CONDITION.  Nicholes Mango M.D on 12/10/2016 at 4:33 PM  Between 7am to 6pm - Pager - [phone]  After 6pm go to [email or web address] - password [email or web address] [facility]  Office  [phone]  CC: Primary care physician; [name]  Note: This dictation was prepared with Dragon dictation along with smaller phrase technology. Any transcriptional errors that result from this process are unintentional.

## 2016-12-10 NOTE — Care Management (Signed)
Results for EUPHA, SLAVEY (MRN [id number]) as of 12/10/2016 16:11  Ref. Range 12/04/2016 09:36 12/04/2016 16:11  Sample type Unknown VENOUS ARTERIAL DRAW  Delivery systems Unknown  BILEVEL POSITIVE .Marland KitchenMarland Kitchen  FIO2 Unknown  0.35  Inspiratory PAP Unknown  14  Expiratory PAP Unknown  6  pH, Arterial Latest Ref Range: 7.350 - 7.450   7.26 (L)  pCO2 arterial Latest Ref Range: 32.0 - 48.0 mmHg  63 (H)  pO2, Arterial Latest Ref Range: 83.0 - 108.0 mmHg  112 (H)  pH, Ven Latest Ref Range: 7.250 - 7.430  7.15 (LL)   pCO2, Ven Latest Ref Range: 44.0 - 60.0 mmHg 90 (HH)   pO2, Ven Latest Ref Range: 32.0 - 45.0 mmHg 37.0   Acid-base deficit Latest Ref Range: 0.0 - 2.0 mmol/L  0.4  Bicarbonate Latest Ref Range: 20.0 - 28.0 mmol/L  28.3 (H)  O2 Saturation Latest Units: %  97.7  Patient temperature Unknown 37.0 37.0  Collection site Unknown VENOUS RIGHT BRACHIAL  Allens test (pass/fail) Latest Ref Range: PASS   PASS

## 2016-12-10 NOTE — Care Management (Signed)
Spoke with patient and she still declines home health nursing services at discharge.  Patient does not qualify for BIPAP at discharge.  Per Dr. Rockne Menghini imperative that patient discharge with BIPAP due to high risk for readmission.  - Per [facility] patient would be required to pay $244 up front,  Follow up out patient for sleep study to see if she qualifies for outpatient BIPAP.  Patient declined  -  [facility] with [facility]  They no longer have a "loaner" BIPAP to provide patients for discharge  - Spoke with [facility] at [facility].  Patient can Sign AOB.  Not pay any up front out of pocket cost for BIPAP.  In this scenario patient would discharge with mask and tubing used in the hospital.  She would be responsible for purchasing water chamber $11.51, and filter which is $3.21 .  Patient will have to follow up with pulmonary outpatient and arrange outpatient sleep study in order to continue use of outpatient BIPAP.  Patient in agreement with this plan.  BIPAP to be delivered 12/10/16.  Order, demo, updated note, H&P and ABG faxed toe [phone]

## 2016-12-11 ENCOUNTER — Telehealth: Payer: Self-pay | Admitting: Internal Medicine

## 2016-12-11 LAB — POTASSIUM: POTASSIUM: 5.6 mmol/L — AB (ref 3.5–5.1)

## 2016-12-11 MED ORDER — ALBUTEROL SULFATE HFA 108 (90 BASE) MCG/ACT IN AERS: 2.0000 | INHALATION_SPRAY | Freq: Four times a day (QID) | RESPIRATORY_TRACT | 1 refills | Status: DC | PRN

## 2016-12-11 MED ORDER — TRAMADOL HCL 50 MG PO TABS: 50.0000 mg | ORAL_TABLET | Freq: Two times a day (BID) | ORAL | 0 refills | Status: DC | PRN

## 2016-12-11 MED ORDER — IPRATROPIUM-ALBUTEROL 0.5-2.5 (3) MG/3ML IN SOLN: 3.0000 mL | Freq: Four times a day (QID) | RESPIRATORY_TRACT | 0 refills | Status: AC | PRN

## 2016-12-11 MED ORDER — PRO-STAT SUGAR FREE PO LIQD: 30.0000 mL | Freq: Every day | ORAL | 0 refills | Status: AC

## 2016-12-11 MED ORDER — SENNOSIDES-DOCUSATE SODIUM 8.6-50 MG PO TABS: 1.0000 | ORAL_TABLET | Freq: Every evening | ORAL | Status: DC | PRN

## 2016-12-11 MED ORDER — FLUCONAZOLE 50 MG PO TABS
150.0000 mg | ORAL_TABLET | Freq: Once | ORAL | Status: AC
  Administered 2016-12-11: 150 mg via ORAL
  Filled 2016-12-11: qty 3

## 2016-12-11 MED ORDER — FLUCONAZOLE 50 MG PO TABS
150.0000 mg | ORAL_TABLET | Freq: Once | ORAL | Status: DC
  Filled 2016-12-11: qty 1

## 2016-12-11 MED ORDER — SODIUM POLYSTYRENE SULFONATE 15 GM/60ML PO SUSP: 15.0000 g | Freq: Once | ORAL | Status: DC

## 2016-12-11 MED ORDER — ACETAMINOPHEN 325 MG PO TABS: 650.0000 mg | ORAL_TABLET | Freq: Four times a day (QID) | ORAL | Status: DC | PRN

## 2016-12-11 NOTE — Discharge Instructions (Signed)
Follow-up with Primary care physician in a week Follow-up with pulmonology Dr. Jamal Collin in 2 weeks Continue nocturnal BiPAP and home oxygen 2 L via nasal cannula

## 2016-12-11 NOTE — Telephone Encounter (Signed)
Put them in a 10:30am to arrive at 10:15am on 12/24/16 with Ramachandran and double book it per me. Thanks

## 2016-12-11 NOTE — Progress Notes (Signed)
PT Cancellation Note  Patient Details Name: Michele Nash MRN: [id number] DOB: September 19, 1956   Cancelled Treatment:    Reason Eval/Treat Not Completed: Medical issues which prohibited therapy   Chart reviewed, Potassium level noted to be 5.6.  PT contraindicated >5.1.  Will continue as appropriate.   Chesley Noon 12/11/2016, 10:08 AM

## 2016-12-11 NOTE — Progress Notes (Signed)
[facility]  ROUNDING NOTE   Subjective:   Hemodialysis treatment yesterday. Tolerated treatment well. UF of 2 litres  Husband at bedside.   BIPAP overnight   Objective:  Vital signs in last 24 hours:  Temp:  [97.6 F (36.4 C)-98.4 F (36.9 C)] 98.4 F (36.9 C) (01/16 0456) Pulse Rate:  [38-88] 71 (01/16 0831) Resp:  [13-20] 20 (01/16 0456) BP: (112-192)/(70-99) 142/76 (01/16 1100) SpO2:  [92 %-100 %] 92 % (01/16 1030)  Weight change: 0 kg (0 lb) Filed Weights   12/06/16 2345 12/10/16 0545 12/10/16 0920  Weight: 57.4 kg (126 lb 9.6 oz) 62.1 kg (136 lb 12.8 oz) 62.1 kg (136 lb 12.8 oz)    Intake/Output: I/O last 3 completed shifts: In: 0  Out: 2000 [Other:2000]   Intake/Output this shift:  No intake/output data recorded.  Physical Exam: General: No acute distress   Head: Normocephalic, atraumatic. Moist oral mucosal membranes  Eyes: Anicteric  Neck: Supple, trachea midline  Lungs:  clear, normal effort, 2 L [place]   Heart: S1S2 no rubs  Abdomen:  Soft, nontener, bowel sounds present  Extremities: No peripheral edema.  Neurologic: Nonfocal, moving all four extremities  Skin: No lesions  Access: LUE AVF    Basic Metabolic Panel:  Recent Labs Lab 12/05/16 1526  12/07/16 0510 12/07/16 0615  12/07/16 1358  12/08/16 0454 12/08/16 1046 12/08/16 1417 12/08/16 1753 12/11/16 0013  NA  --   --  135 136  --   --   --  138  --   --   --   --   K  --   < > 6.4* 6.4*  < > 5.3*  < > 4.6 5.3* 5.3* 5.1 5.6*  CL  --   --  94* 94*  --   --   --  96*  --   --   --   --   CO2  --   --  27 28  --   --   --  29  --   --   --   --   GLUCOSE  --   --  138* 135*  --   --   --  186*  --   --   --   --   BUN  --   --  65* 64*  --   --   --  40*  --   --   --   --   CREATININE  --   --  9.42* 9.50*  --   --   --  6.60*  --   --   --   --   CALCIUM  --   --  8.1* 8.1*  --   --   --  8.6*  --   --   --   --   MG  --   --   --   --   --   --   --  1.7  --   --   --    --   PHOS 6.5*  --   --   --   --  6.5*  --  5.6*  --   --   --   --   < > = values in this interval not displayed.  Liver Function Tests: No results for input(s): AST, ALT, ALKPHOS, BILITOT, PROT, ALBUMIN in the last 168 hours. No results for input(s): LIPASE, AMYLASE in the last 168 hours. No results for  input(s): AMMONIA in the last 168 hours.  CBC:  Recent Labs Lab 12/07/16 0510 12/08/16 0454  WBC 10.7 7.0  HGB 12.8 12.2  HCT 40.2 37.9  MCV 98.9 98.4  PLT 259 246    Cardiac Enzymes: No results for input(s): CKTOTAL, CKMB, CKMBINDEX, TROPONINI in the last 168 hours.  BNP: Invalid input(s): POCBNP  CBG:  Recent Labs Lab 12/04/16 1241 12/07/16 1024  GLUCAP 117* 267*    Microbiology: Results for orders placed or performed during the hospital encounter of 12/04/16  Culture, blood (Routine x 2)     Status: None   Collection Time: 12/04/16  3:11 AM  Result Value Ref Range Status   Specimen Description BLOOD R WRIST  Final   Special Requests   Final    BOTTLES DRAWN AEROBIC AND ANAEROBIC AER 5ML ANA 2ML   Culture NO GROWTH 5 DAYS  Final   Report Status 12/09/2016 FINAL  Final  Culture, blood (Routine x 2)     Status: None   Collection Time: 12/04/16  3:37 AM  Result Value Ref Range Status   Specimen Description BLOOD R FA  Final   Special Requests   Final    BOTTLES DRAWN AEROBIC AND ANAEROBIC AER 8ML ANA 6ML   Culture NO GROWTH 5 DAYS  Final   Report Status 12/09/2016 FINAL  Final  Urine culture     Status: None   Collection Time: 12/04/16  4:46 AM  Result Value Ref Range Status   Specimen Description URINE, RANDOM  Final   Special Requests NONE  Final   Culture NO GROWTH Performed at [facility]   Final   Report Status 12/05/2016 FINAL  Final    Coagulation Studies: No results for input(s): LABPROT, INR in the last 72 hours.  Urinalysis: No results for input(s): COLORURINE, LABSPEC, PHURINE, GLUCOSEU, HGBUR, BILIRUBINUR, KETONESUR,  PROTEINUR, UROBILINOGEN, NITRITE, LEUKOCYTESUR in the last 72 hours.  Invalid input(s): APPERANCEUR    Imaging: No results found.   Medications:    . amLODipine  10 mg Oral Daily  . budesonide (PULMICORT) nebulizer solution  0.25 mg Nebulization BID  . calcium acetate  1,334 mg Oral TID WC  . carvedilol  12.5 mg Oral BID WC  . cefTRIAXone  1 g Intravenous Q24H  . chlorhexidine  15 mL Mouth Rinse BID  . cinacalcet  60 mg Oral Q breakfast  . feeding supplement (PRO-STAT SUGAR FREE 64)  30 mL Oral Daily  . furosemide  40 mg Oral Q T,Th,S,Su  . heparin  5,000 Units Subcutaneous Q8H  . ipratropium-albuterol  3 mL Nebulization QID  . mouth rinse  15 mL Mouth Rinse q12n4p  . pantoprazole  40 mg Oral Daily  . sertraline  50 mg Oral Daily   acetaminophen **OR** [DISCONTINUED] acetaminophen, ALPRAZolam, alteplase, alum & mag hydroxide-simeth, bisacodyl, fluticasone, heparin, hydrALAZINE, ketorolac, lidocaine (PF), lidocaine-prilocaine, lidocaine-prilocaine, ondansetron **OR** ondansetron (ZOFRAN) IV, pentafluoroprop-tetrafluoroeth, senna-docusate, traMADol  Assessment/ Plan:  61 y.o.black female with hypertension, hyperlipidemia, diastolic heart failure, hypertension, COPD, tobacco abuse, anemia, SHPTH, cocaine abuse,   [facility]/ [address] /MWF/ left armAVF  1. ESRD on HD MWF: tolerated treatment well.  Hyperkalemia this morning. - low K diet - next dialysis for tomorrow.   2. Hypertension: at goal.  - carvedilol, amlodipine and furosemide.   3. Anemia of chronic kidney disease. Hemoglobin 12.2  - Hold ESA  4. Secondary hyperparathyroidism with hyperphosphatemia:  - Calcium acetate with meals - Cinacalcet   LOS: Michele Nash,  [place] 1/16/201811:42 AM

## 2016-12-11 NOTE — Telephone Encounter (Signed)
[facility] called for hosp f/u in 2 weeks, please advise of availability.

## 2016-12-11 NOTE — Discharge Summary (Signed)
[facility] [place] [facility] NAME: Fleda Boga    MR#:  [id number]  DATE OF BIRTH:  [birth year]  DATE OF ADMISSION:  12/04/2016 ADMITTING PHYSICIAN: Fritzi Mandes, MD  DATE OF DISCHARGE: 12/11/16 PRIMARY CARE PHYSICIAN: [name]    ADMISSION DIAGNOSIS:  Hypoxia [R09.02] Contusion of right lung, initial encounter [S27.321A] Closed fracture of multiple ribs of right side, initial encounter [S22.41XA] Hypertension, unspecified type [I10]  DISCHARGE DIAGNOSIS:  Active Problems:   Respiratory failure, acute and chronic (HCC)   Multiple closed fractures of ribs of right side   SECONDARY DIAGNOSIS:   Past Medical History:  Diagnosis Date  . Anemia   . Apnea, sleep    for sleep study 10/17/15-no cpap yet  . Arthritis   . Bronchitis   . Chronic kidney disease   . COPD (chronic obstructive pulmonary disease) ([place])   . Depression   . Dialysis patient ([place]) 2014  . GERD (gastroesophageal reflux disease)   . Headache   . Hyperlipidemia   . Hypertension   . Obesity   . Renal dialysis device, implant, or graft complication    RIGHT CHEST CATH    HOSPITAL COURSE:  Shaleta Prestage  is a 61 y.o. female with a known history of COPD, Chronic resp failure on 2 L O2, ESRD on HD with recurrent falls here after she fell and hit chest on a table. CXR showed right posterolateral rib fractures 6,7 and posterior 11,12. Patient is drowsy but wakes up on calling her name and gives history. She mentions she has had recurrent falls recently. Patient was going to the bathroom and tripped and fell landing on her right chest on to an edge of the table. Acute pain caused her to come to the emergency room with EMS. She has received 1 dose of Percocet. Presently drowsy. Chest x-ray also raises concern that there is any confusion  Hospital course   * Acute on chronic respiratory failureWith hypoxia and hypercapnis due to  Multiple rib fractures BiPAP daily at  bedtime,Case management to arrange nocturnal BiPAP Oxygen via nasal cannula Follow-up with pulmonology-  Op  Patient needs outpatient sleep study and outpatient pulmonology follow-up after discharge Patient has received IV Rocephin and during the hospital course for a total of 7 days. Discontinue antibiotics Blood cultures and urine cultures are negative and  * Acute encephalopathy due to CO2 narcosis. Resolved continue nocturnal BiPAP   * Right Posterolateral 10,11,12 ribs fracture No lung contusion or pneumothorax. Appreciate surgery input, no interventions needed at this time  * COPD. No exacerbation Nebs and inhalers  * End-stage renal disease on hemodialysis  Patient had hemodialysis 12/07/16  and potassium  5.6 today and discussed with nephrology recommended low potassium diet and outpatient follow-up with hemodialysis center tomorrow for continuation of dialysis. Not recommending Kayexalate at this time. Patient is a symptomatic Appreciate nephrology help with HD. Patient had hemodialysis 12/10/2016  * DVT prophylaxis with heparin   DISCHARGE CONDITIONS:   stable  CONSULTS OBTAINED:  Treatment Team:  Anthonette Legato, MD   PROCEDURES bipap  DRUG ALLERGIES:   Allergies  Allergen Reactions  . Chantix [Varenicline]     hallucinations  . Sulfa Antibiotics Itching, Swelling, Rash and Other (See Comments)    Reaction:  Facial/body swelling     DISCHARGE MEDICATIONS:   Current Discharge Medication List    START taking these medications   Details  acetaminophen (TYLENOL) 325 MG tablet Take 2 tablets (650 mg  total) by mouth every 6 (six) hours as needed for mild pain (or Fever >/= 101).    Amino Acids-Protein Hydrolys (FEEDING SUPPLEMENT, PRO-STAT SUGAR FREE 64,) LIQD Take 30 mLs by mouth daily. Qty: 900 mL, Refills: 0    ipratropium-albuterol (DUONEB) 0.5-2.5 (3) MG/3ML SOLN Take 3 mLs by nebulization every 6 (six) hours as needed. Qty: 360 mL, Refills: 0     senna-docusate (SENOKOT-S) 8.6-50 MG tablet Take 1 tablet by mouth at bedtime as needed for mild constipation.    traMADol (ULTRAM) 50 MG tablet Take 1 tablet (50 mg total) by mouth every 12 (twelve) hours as needed for moderate pain. Qty: 25 tablet, Refills: 0      CONTINUE these medications which have CHANGED   Details  albuterol (PROVENTIL HFA;VENTOLIN HFA) 108 (90 Base) MCG/ACT inhaler Inhale 2 puffs into the lungs every 6 (six) hours as needed for wheezing or shortness of breath. Qty: 1 Inhaler, Refills: 1      CONTINUE these medications which have NOT CHANGED   Details  ALPRAZolam (XANAX) 0.5 MG tablet Take 0.5 mg by mouth 2 (two) times daily.    amLODipine (NORVASC) 10 MG tablet Take 10 mg by mouth daily.     calcium acetate (PHOSLO) 667 MG capsule Take 1,334 mg by mouth 3 (three) times daily.     carvedilol (COREG) 12.5 MG tablet Take 12.5 mg by mouth 2 (two) times daily with a meal.    cinacalcet (SENSIPAR) 30 MG tablet Take 60 mg by mouth daily with breakfast.     ezetimibe (ZETIA) 10 MG tablet Take 10 mg by mouth daily.     fluticasone (FLONASE) 50 MCG/ACT nasal spray Place 2 sprays into both nostrils daily as needed for rhinitis.    furosemide (LASIX) 40 MG tablet Take 40 mg by mouth every Tuesday, Thursday, Saturday, and Sunday.     lidocaine-prilocaine (EMLA) cream Apply 1 application topically as needed (prior to accessing port).    losartan (COZAAR) 100 MG tablet Take 100 mg by mouth daily.     pantoprazole (PROTONIX) 40 MG tablet Take 40 mg by mouth 2 (two) times daily.    feeding supplement, ENSURE ENLIVE, (ENSURE ENLIVE) LIQD Take 237 mLs by mouth 3 (three) times daily between meals. Qty: 237 mL, Refills: 12    multivitamin (RENA-VIT) TABS tablet Take 1 tablet by mouth at bedtime.     sertraline (ZOLOFT) 50 MG tablet Take 50 mg by mouth daily.       STOP taking these medications     omeprazole (PRILOSEC) 20 MG capsule          DISCHARGE  INSTRUCTIONS:  Follow-up with Primary care physician in a week Follow-up with pulmonology Dr. Jamal Collin in 2 weeks Continue nocturnal BiPAP and home oxygen 2 L via nasal cannula Continue hemodialysis on Monday, Wednesday and Friday Continue to follow up with nephrology   DIET:  Renal diet  DISCHARGE CONDITION:  Stable  ACTIVITY:  Activity as tolerated,  OXYGEN:  Home Oxygen: Yes.     Oxygen Delivery: 2 liters/min via Patient connected to nasal cannula oxygen  Continue nocturnal BiPAP  DISCHARGE LOCATION:  home   If you experience worsening of your admission symptoms, develop shortness of breath, life threatening emergency, suicidal or homicidal thoughts you must seek medical attention immediately by calling 911 or calling your MD immediately  if symptoms less severe.  You Must read complete instructions/literature along with all the possible adverse reactions/side effects for all the Medicines you  take and that have been prescribed to you. Take any new Medicines after you have completely understood and accpet all the possible adverse reactions/side effects.   Please note  You were cared for by a hospitalist during your hospital stay. If you have any questions about your discharge medications or the care you received while you were in the hospital after you are discharged, you can call the unit and asked to speak with the hospitalist on call if the hospitalist that took care of you is not available. Once you are discharged, your primary care physician will handle any further medical issues. Please note that NO REFILLS for any discharge medications will be authorized once you are discharged, as it is imperative that you return to your primary care physician (or establish a relationship with a primary care physician if you do not have one) for your aftercare needs so that they can reassess your need for medications and monitor your lab values.     Today  Chief Complaint  Patient  presents with  . Fall  . Rib Injury   Pt is doing fine today. Denies any pain or shortness of breath. Wants to go home   ROS:  CONSTITUTIONAL: Denies fevers, chills. Denies any fatigue, weakness.  EYES: Denies blurry vision, double vision, eye pain. EARS, NOSE, THROAT: Denies tinnitus, ear pain, hearing loss. RESPIRATORY: Denies cough, wheeze, shortness of breath.  CARDIOVASCULAR: Denies chest pain, palpitations, edema.  GASTROINTESTINAL: Denies nausea, vomiting, diarrhea, abdominal pain. Denies bright red blood per rectum. GENITOURINARY: Denies dysuria, hematuria. ENDOCRINE: Denies nocturia or thyroid problems. HEMATOLOGIC AND LYMPHATIC: Denies easy bruising or bleeding. SKIN: Denies rash or lesion. MUSCULOSKELETAL: Denies pain in neck, back, shoulder, knees, hips or arthritic symptoms.  NEUROLOGIC: Denies paralysis, paresthesias.  PSYCHIATRIC: Denies anxiety or depressive symptoms.   VITAL SIGNS:  Blood pressure (!) 142/76, pulse 71, temperature 98.4 F (36.9 C), resp. rate 20, height 5\' 5"  (1.651 m), weight 62.1 kg (136 lb 12.8 oz), SpO2 92 %.  I/O:    Intake/Output Summary (Last 24 hours) at 12/11/16 1221 Last data filed at 12/11/16 0456  Gross per 24 hour  Intake                0 ml  Output             2000 ml  Net            -2000 ml    PHYSICAL EXAMINATION:  GENERAL:  61 y.o.-year-old patient lying in the bed with no acute distress.  EYES: Pupils equal, round, reactive to light and accommodation. No scleral icterus. Extraocular muscles intact.  HEENT: Head atraumatic, normocephalic. Oropharynx and nasopharynx clear.  NECK:  Supple, no jugular venous distention. No thyroid enlargement, no tenderness.  LUNGS: Normal breath sounds bilaterally, no wheezing, rales,rhonchi or crepitation. No use of accessory muscles of respiration.  CARDIOVASCULAR: S1, S2 normal. No murmurs, rubs, or gallops.  ABDOMEN: Soft, non-tender, non-distended. Bowel sounds present. No  organomegaly or mass.  EXTREMITIES: No pedal edema, cyanosis, or clubbing.  NEUROLOGIC: Cranial nerves II through XII are intact. Muscle strength 5/5 in all extremities. Sensation intact. Gait not checked.  PSYCHIATRIC: The patient is alert and oriented x 3.  SKIN: No obvious rash, lesion, or ulcer.   DATA REVIEW:   CBC  Recent Labs Lab 12/08/16 0454  WBC 7.0  HGB 12.2  HCT 37.9  PLT 246    Chemistries   Recent Labs Lab 12/08/16 0454  12/11/16 0013  NA 138  --   --   K 4.6  < > 5.6*  CL 96*  --   --   CO2 29  --   --   GLUCOSE 186*  --   --   BUN 40*  --   --   CREATININE 6.60*  --   --   CALCIUM 8.6*  --   --   MG 1.7  --   --   < > = values in this interval not displayed.  Cardiac Enzymes No results for input(s): TROPONINI in the last 168 hours.  Microbiology Results  Results for orders placed or performed during the hospital encounter of 12/04/16  Culture, blood (Routine x 2)     Status: None   Collection Time: 12/04/16  3:11 AM  Result Value Ref Range Status   Specimen Description BLOOD R WRIST  Final   Special Requests   Final    BOTTLES DRAWN AEROBIC AND ANAEROBIC AER 5ML ANA 2ML   Culture NO GROWTH 5 DAYS  Final   Report Status 12/09/2016 FINAL  Final  Culture, blood (Routine x 2)     Status: None   Collection Time: 12/04/16  3:37 AM  Result Value Ref Range Status   Specimen Description BLOOD R FA  Final   Special Requests   Final    BOTTLES DRAWN AEROBIC AND ANAEROBIC AER 8ML ANA 6ML   Culture NO GROWTH 5 DAYS  Final   Report Status 12/09/2016 FINAL  Final  Urine culture     Status: None   Collection Time: 12/04/16  4:46 AM  Result Value Ref Range Status   Specimen Description URINE, RANDOM  Final   Special Requests NONE  Final   Culture NO GROWTH Performed at [facility]   Final   Report Status 12/05/2016 FINAL  Final    RADIOLOGY:  Dg Chest Port 1 View  Result Date: 12/07/2016 CLINICAL DATA:  Acute respiratory failure. EXAM:  PORTABLE CHEST 1 VIEW COMPARISON:  12/06/2016 FINDINGS: The cardio pericardial silhouette is enlarged. Right base atelectasis again noted. No pulmonary edema or substantial pleural effusion. Skin fold projects over the left lateral lung. Calcification at the right apex projected outside the lung on prior exam. The visualized bony structures of the thorax are intact. Telemetry leads overlie the chest. IMPRESSION: Cardiomegaly with progressive right basilar atelectasis. Electronically Signed   By: Misty Stanley M.D.   On: 12/07/2016 20:53    EKG:   Orders placed or performed during the hospital encounter of 12/04/16  . EKG 12-Lead  . EKG 12-Lead  . EKG 12-Lead  . EKG 12-Lead  . EKG 12-Lead  . EKG 12-Lead      Management plans discussed with the patient, family and they are in agreement.  More than 50% time was spent on coordination of care and counseling of the patient and family   CODE STATUS:     Code Status Orders        Start     Ordered   12/04/16 0956  Full code  Continuous     12/04/16 0955    Code Status History    Date Active Date Inactive Code Status Order ID Comments User Context   10/29/2016 12:14 PM 10/30/2016  7:43 PM Full Code [id number]  Laverle Hobby, MD ED   10/06/2016  8:33 PM 10/11/2016  3:54 PM Full Code [id number]  Mikael Spray, NP ED   09/14/2016  9:19 AM 09/16/2016  4:07  PM Full Code [id number]  Laverle Hobby, MD ED   07/23/2016 11:05 AM 07/24/2016 10:49 PM Full Code [id number]  Wilhelmina Mcardle, MD ED   07/09/2016  6:43 PM 07/13/2016  5:23 PM Full Code [id number]  Lytle Butte, MD ED   05/28/2016  2:54 PM 06/02/2016  2:51 PM Full Code [id number]  Theodoro Grist, MD Inpatient   04/25/2016  6:04 PM 04/26/2016  9:17 PM Full Code [id number]  Nicholes Mango, MD Inpatient   10/28/2015  6:50 PM 10/30/2015  6:09 PM Full Code [id number]  Theodoro Grist, MD Inpatient   03/30/2015 12:51 AM 03/31/2015  4:44 PM Full Code [id number]  Juluis Mire, MD Inpatient    Advance  Directive Documentation   Flowsheet Row Most Recent Value  Type of Advance Directive  Living will  Pre-existing out of facility DNR order (yellow form or pink MOST form)  No data  "MOST" Form in Place?  No data      TOTAL TIME TAKING CARE OF THIS PATIENT: 45  minutes.   Note: This dictation was prepared with Dragon dictation along with smaller phrase technology. Any transcriptional errors that result from this process are unintentional.   @[facility] @  on 12/11/2016 at 12:21 PM  Between 7am to 6pm - Pager - [phone]  After 6pm go to [email or web address] - password [email or web address] [facility]  [facility]  Office  [phone]  CC: Primary care physician; [name]

## 2016-12-11 NOTE — Progress Notes (Signed)
IV was removed. Discharge instructions, follow-up appointments, and prescriptions were provided to the pt and husband at bedside. The pt was delivered her BiPAP and Nebulizer machines. The pt was taken downstairs via wheelchair by NT.

## 2016-12-11 NOTE — Care Management (Signed)
[place]er BIPAP delivered to room by [facility].  Nebulizer delivered to room by [facility] from [facility]. Patient declined home health RN services.  RNCM signing off.

## 2016-12-11 NOTE — Care Management Important Message (Signed)
Important Message  Patient Details  Name: Michele Nash MRN: [id number] Date of Birth: 21-Dec-1955   Medicare Important Message Given:  Yes    Beverly Sessions, RN 12/11/2016, 2:19 PM

## 2016-12-23 NOTE — Progress Notes (Deleted)
* [facility]     Assessment and Plan:  AECOPD and volume overload-- now doing much better.  --Recently DC from hospital with steroid taper. Now doing better.  --Continue current regimen of advair once daily.  --Acute on chronic respiratory failure due to multiple factors. Continue oxygen.   --OSA  --Sleep study 10/17/15; AHI 60. --Not using CPAP, she did not like the electrodes in her head so did not go back for the titration; and refuses to go back for another sleep study.  --Will attempt auto-PAP if covered. If not will see if we can get an Home sleep study.   Nicotine Abuse.  --She has stopped smoking since getting out of the hospital.  --discussed the importance of continued smoking cessation, 3 min spent in discussion.   Acute on chronic respiratory failure. -Multifactorial due to COPD, volume overload, end-stage renal disease. -Continue oxygen at 3 L.  Date: 12/23/2016  MRN# [id number] Michele Nash 1956/09/27   Michele Nash is a 61 y.o. old female seen in follow up for chief complaint of  No chief complaint on file.    HPI:   This is a 61 year old African-American female with a past medical history of end-stage renal disease on dialysis Monday, Wednesdays and Fridays, obstructive sleep apnea on home O2 at 3 L, COPD, depression, and hypertension. She also has a history of chronic hypercapnic and hypoxic resp failure with baseline CO2 between 55-65.  She was seen in the hospital recently and discharged for rib fracture with pulmonary contusion. This was her 8th admission at [facility] in the past 7 months for various issues including AECOPD, pulmonary edema, GI bleeding, falls. At her last visit in the office it was noted that she should be on CPAP, but she had to be retested, therefore she was referred for a HST.     She is not on cpap. She had a sleep study but did not like having the electrodes in her hair so did not go back for the cpap titration  study.   Sleep study 10/17/15; AHI 60.   Medication:   Outpatient Encounter Prescriptions as of 12/24/2016  Medication Sig  . acetaminophen (TYLENOL) 325 MG tablet Take 2 tablets (650 mg total) by mouth every 6 (six) hours as needed for mild pain (or Fever >/= 101).  Marland Kitchen albuterol (PROVENTIL HFA;VENTOLIN HFA) 108 (90 Base) MCG/ACT inhaler Inhale 2 puffs into the lungs every 6 (six) hours as needed for wheezing or shortness of breath.  . ALPRAZolam (XANAX) 0.5 MG tablet Take 0.5 mg by mouth 2 (two) times daily.  . Amino Acids-Protein Hydrolys (FEEDING SUPPLEMENT, PRO-STAT SUGAR FREE 64,) LIQD Take 30 mLs by mouth daily.  Marland Kitchen amLODipine (NORVASC) 10 MG tablet Take 10 mg by mouth daily.   . calcium acetate (PHOSLO) 667 MG capsule Take 1,334 mg by mouth 3 (three) times daily.   . carvedilol (COREG) 12.5 MG tablet Take 12.5 mg by mouth 2 (two) times daily with a meal.  . cinacalcet (SENSIPAR) 30 MG tablet Take 60 mg by mouth daily with breakfast.   . ezetimibe (ZETIA) 10 MG tablet Take 10 mg by mouth daily.   . feeding supplement, ENSURE ENLIVE, (ENSURE ENLIVE) LIQD Take 237 mLs by mouth 3 (three) times daily between meals.  . fluticasone (FLONASE) 50 MCG/ACT nasal spray Place 2 sprays into both nostrils daily as needed for rhinitis.  . furosemide (LASIX) 40 MG tablet Take 40 mg by mouth every Tuesday, Thursday, Saturday, and  Sunday.   Marland Kitchen ipratropium-albuterol (DUONEB) 0.5-2.5 (3) MG/3ML SOLN Take 3 mLs by nebulization every 6 (six) hours as needed.  . lidocaine-prilocaine (EMLA) cream Apply 1 application topically as needed (prior to accessing port).  . losartan (COZAAR) 100 MG tablet Take 100 mg by mouth daily.   . multivitamin (RENA-VIT) TABS tablet Take 1 tablet by mouth at bedtime.   . pantoprazole (PROTONIX) 40 MG tablet Take 40 mg by mouth 2 (two) times daily.  Marland Kitchen senna-docusate (SENOKOT-S) 8.6-50 MG tablet Take 1 tablet by mouth at bedtime as needed for mild constipation.  . sertraline (ZOLOFT)  50 MG tablet Take 50 mg by mouth daily.   . traMADol (ULTRAM) 50 MG tablet Take 1 tablet (50 mg total) by mouth every 12 (twelve) hours as needed for moderate pain.   No facility-administered encounter medications on file as of 12/24/2016.      Allergies:  Chantix [varenicline] and Sulfa antibiotics  Review of Systems: Gen:  Denies  fever, sweats. HEENT: Denies blurred vision. Cvc:  No dizziness, chest pain or heaviness Resp:   Denies cough or sputum porduction. Gi: Denies swallowing difficulty, stomach pain. constipation, bowel incontinence Gu:  Denies bladder incontinence, burning urine Ext:   No Joint pain, stiffness. Skin: No skin rash, easy bruising. Endoc:  No polyuria, polydipsia. Psych: No depression, insomnia. Other:  All other systems were reviewed and found to be negative other than what is mentioned in the HPI.   Physical Examination:   VS: There were no vitals taken for this visit.  General Appearance: No distress  Neuro:without focal findings,  speech normal,  HEENT: PERRLA, EOM intact. Pulmonary: normal breath sounds, No wheezing.   CardiovascularNormal S1,S2.  No m/r/g.   Abdomen: Benign, Soft, non-tender. Renal:  No costovertebral tenderness  GU:  Not performed at this time. Endoc: No evident thyromegaly, no signs of acromegaly. Skin:   warm, no rash. Extremities: normal, no cyanosis, clubbing.   LABORATORY PANEL:   CBC No results for input(s): WBC, HGB, HCT, PLT in the last 168 hours. ------------------------------------------------------------------------------------------------------------------  Chemistries  No results for input(s): NA, K, CL, CO2, GLUCOSE, BUN, CREATININE, CALCIUM, MG, AST, ALT, ALKPHOS, BILITOT in the last 168 hours.  Invalid input(s): GFRCGP ------------------------------------------------------------------------------------------------------------------  Cardiac Enzymes No results for input(s): TROPONINI in the last 168  hours. ------------------------------------------------------------  RADIOLOGY:   No results found for this or any previous visit. Results for orders placed during the hospital encounter of 05/02/16  DG Chest 2 View   Narrative CLINICAL DATA:  61 year old female with history of shortness of breath and chest pain starting earlier this morning after waking. Pressure and heaviness in the chest.  EXAM: CHEST  2 VIEW  COMPARISON:  Chest x-ray 04/25/2016.  FINDINGS: Mild diffuse peribronchial cuffing. Lung volumes are within normal limits. No acute consolidative airspace disease. No pleural effusions. No pneumothorax. No definite suspicious appearing pulmonary nodules or masses. No cephalization of the pulmonary vasculature. Mild cardiomegaly. The patient is rotated to the right on today's exam, resulting in distortion of the mediastinal contours and reduced diagnostic sensitivity and specificity for mediastinal pathology. Atherosclerosis in the thoracic aorta.  IMPRESSION: 1. Mild diffuse peribronchial cuffing may suggest an acute bronchitis. 2. Mild cardiomegaly. 3. Atherosclerosis.   Electronically Signed   By: Vinnie Langton M.D.   On: 05/02/2016 11:45    ------------------------------------------------------------------------------------------------------------------  Thank  you for allowing [facility]  Pulmonary, Critical Care to assist in the care of your patient. Our recommendations are noted above.  Please contact  us if we can be of further service.   Marda Stalker, MD.  [facility] Office Number: [phone]  Patricia Pesa, M.D.  Vilinda Boehringer, M.D.  Merton Border, M.D  12/23/2016

## 2016-12-24 ENCOUNTER — Inpatient Hospital Stay: Payer: Medicare Other | Admitting: Internal Medicine

## 2016-12-24 ENCOUNTER — Encounter: Payer: Self-pay | Admitting: *Deleted

## 2016-12-25 ENCOUNTER — Institutional Professional Consult (permissible substitution): Payer: Medicare Other | Admitting: Internal Medicine

## 2017-03-19 ENCOUNTER — Encounter: Payer: Self-pay | Admitting: *Deleted

## 2017-03-21 ENCOUNTER — Ambulatory Visit: Payer: Medicare Other | Admitting: General Surgery

## 2017-03-24 ENCOUNTER — Emergency Department: Payer: Medicare Other

## 2017-03-24 ENCOUNTER — Encounter: Payer: Self-pay | Admitting: Intensive Care

## 2017-03-24 ENCOUNTER — Emergency Department
Admission: EM | Admit: 2017-03-24 | Discharge: 2017-03-24 | Disposition: A | Discharge status: Home / Self Care | Payer: Medicare Other | Attending: Emergency Medicine | Admitting: Emergency Medicine

## 2017-03-24 DIAGNOSIS — K8500 Idiopathic acute pancreatitis without necrosis or infection: Secondary | ICD-10-CM | POA: Diagnosis not present

## 2017-03-24 DIAGNOSIS — R1011 Right upper quadrant pain: Secondary | ICD-10-CM | POA: Diagnosis present

## 2017-03-24 DIAGNOSIS — Z992 Dependence on renal dialysis: Secondary | ICD-10-CM | POA: Insufficient documentation

## 2017-03-24 DIAGNOSIS — I120 Hypertensive chronic kidney disease with stage 5 chronic kidney disease or end stage renal disease: Secondary | ICD-10-CM | POA: Insufficient documentation

## 2017-03-24 DIAGNOSIS — J449 Chronic obstructive pulmonary disease, unspecified: Secondary | ICD-10-CM | POA: Insufficient documentation

## 2017-03-24 DIAGNOSIS — F17210 Nicotine dependence, cigarettes, uncomplicated: Secondary | ICD-10-CM | POA: Insufficient documentation

## 2017-03-24 DIAGNOSIS — Z79899 Other long term (current) drug therapy: Secondary | ICD-10-CM | POA: Insufficient documentation

## 2017-03-24 DIAGNOSIS — N186 End stage renal disease: Secondary | ICD-10-CM | POA: Insufficient documentation

## 2017-03-24 LAB — COMPREHENSIVE METABOLIC PANEL
ALT: 14 U/L (ref 14–54)
ANION GAP: 13 (ref 5–15)
AST: 17 U/L (ref 15–41)
Albumin: 3.8 g/dL (ref 3.5–5.0)
Alkaline Phosphatase: 183 U/L — ABNORMAL HIGH (ref 38–126)
BUN: 62 mg/dL — ABNORMAL HIGH (ref 6–20)
CHLORIDE: 99 mmol/L — AB (ref 101–111)
CO2: 26 mmol/L (ref 22–32)
CREATININE: 8.8 mg/dL — AB (ref 0.44–1.00)
Calcium: 7.6 mg/dL — ABNORMAL LOW (ref 8.9–10.3)
GFR, EST AFRICAN AMERICAN: 5 mL/min — AB (ref >60)
GFR, EST NON AFRICAN AMERICAN: 4 mL/min — AB (ref >60)
Glucose, Bld: 129 mg/dL — ABNORMAL HIGH (ref 65–99)
Potassium: 5.6 mmol/L — ABNORMAL HIGH (ref 3.5–5.1)
Sodium: 138 mmol/L (ref 135–145)
Total Bilirubin: 0.7 mg/dL (ref 0.3–1.2)
Total Protein: 7.2 g/dL (ref 6.5–8.1)

## 2017-03-24 LAB — CBC
HCT: 33.2 % — ABNORMAL LOW (ref 35.0–47.0)
Hemoglobin: 10.7 g/dL — ABNORMAL LOW (ref 12.0–16.0)
MCH: 31.8 pg (ref 26.0–34.0)
MCHC: 32.1 g/dL (ref 32.0–36.0)
MCV: 99.1 fL (ref 80.0–100.0)
PLATELETS: 266 10*3/uL (ref 150–440)
RBC: 3.35 MIL/uL — AB (ref 3.80–5.20)
RDW: 16.6 % — ABNORMAL HIGH (ref 11.5–14.5)
WBC: 7.3 10*3/uL (ref 3.6–11.0)

## 2017-03-24 LAB — TROPONIN I: TROPONIN I: 0.04 ng/mL — AB (ref <0.03)

## 2017-03-24 LAB — LIPASE, BLOOD: LIPASE: 90 U/L — AB (ref 11–51)

## 2017-03-24 MED ORDER — CYCLOBENZAPRINE HCL 10 MG PO TABS
10.0000 mg | ORAL_TABLET | Freq: Once | ORAL | Status: AC
  Administered 2017-03-24: 10 mg via ORAL
  Filled 2017-03-24: qty 1

## 2017-03-24 MED ORDER — HYDROCODONE-ACETAMINOPHEN 5-325 MG PO TABS: 1.0000 | ORAL_TABLET | Freq: Four times a day (QID) | ORAL | 0 refills | Status: DC | PRN

## 2017-03-24 MED ORDER — DIPHENHYDRAMINE HCL 50 MG/ML IJ SOLN
25.0000 mg | Freq: Once | INTRAMUSCULAR | Status: AC
  Administered 2017-03-24: 25 mg via INTRAVENOUS
  Filled 2017-03-24: qty 1

## 2017-03-24 MED ORDER — OXYCODONE-ACETAMINOPHEN 5-325 MG PO TABS
1.0000 | ORAL_TABLET | Freq: Once | ORAL | Status: AC
  Administered 2017-03-24: 1 via ORAL
  Filled 2017-03-24: qty 1

## 2017-03-24 MED ORDER — ONDANSETRON HCL 4 MG PO TABS: 4.0000 mg | ORAL_TABLET | Freq: Three times a day (TID) | ORAL | 0 refills | Status: AC | PRN

## 2017-03-24 NOTE — ED Notes (Signed)
Returned from XR 

## 2017-03-24 NOTE — ED Notes (Signed)
Pt is anuric due to dialysis, spoke with Dr. Reita Cliche, ok to DC order for UA

## 2017-03-24 NOTE — ED Notes (Signed)
Pt c/o itching and dry skin, no rash noticed, patient states she usually takes benadryl for itching and uses lotion. Will notified Dr. Reita Cliche.

## 2017-03-24 NOTE — Discharge Instructions (Signed)
You were evaluated for upper abdominal pain and found to have evidence of pancreatitis which is inflammation of the pancreas. As we discussed, you may have a gallstone, and if you develop any new or worsening pain you need to come back to the emergency department for evaluation of possible blockage.  Return to the the emergency department immediately for any worsening abdominal pain, vomiting and cannot keep medications down, fever, vomiting blood, black or bloody stools, or any other symptoms concerning to you.  You do need to go to dialysis tomorrow.

## 2017-03-24 NOTE — ED Triage Notes (Signed)
Patient presents to ER with abdominal pain all over that radiates to her back. Patient wears 3L continuously. A&O x4 in triage.

## 2017-03-24 NOTE — ED Provider Notes (Signed)
[facility] Emergency Department Provider Note ____________________________________________   I have reviewed the triage vital signs and the triage nursing note.  HISTORY  Chief Complaint Abdominal Pain   Historian Patient and her husband at the bedside  HPI Michele Nash is a 61 y.o. female who is on home oxygen 3 L for COPD chronically, denies any cardiac issues, is a dialysis patient, presents for right-sided lower chest or upper abdominal pain since yesterday. She was hanging curtains and noticed the pain on that side. Since then she has been having moderate to severe intermittent pains on the right side. It is made worse by turning towards the right side. She feels like her abdomen is a little swollen. She file she has some pain through to the back. No increased shortness of breath. No palpitations. No fever. No coughing. Pain is severe at worst, currently in mild to moderate when she is just sitting, worse if she moves her trunk.    Past Medical History:  Diagnosis Date  . Anemia   . Apnea, sleep    for sleep study 10/17/15-no cpap yet  . Arthritis   . Bronchitis   . Chronic kidney disease   . COPD (chronic obstructive pulmonary disease) ([place])   . Depression   . Dialysis patient ([place]) 2014  . GERD (gastroesophageal reflux disease)   . Headache   . Hyperlipidemia   . Hypertension   . Obesity   . Renal dialysis device, implant, or graft complication    RIGHT CHEST CATH    Patient Active Problem List   Diagnosis Date Noted  . Respiratory failure, acute and chronic ([place]) 12/04/2016  . Multiple closed fractures of ribs of right side   . Hyperlipidemia 11/28/2016  . COPD exacerbation ([place]) 10/29/2016  . Altered mental status   . Fall   . Traumatic hematoma of forehead   . Acute respiratory failure with hypercapnia ([place]) 10/06/2016  . Pulmonary edema 09/14/2016  . Respiratory failure ([place]) 07/23/2016  . GIB (gastrointestinal bleeding)  07/09/2016  . Protein-calorie malnutrition, severe 05/29/2016  . Hyperglycemia 05/28/2016  . Pain in limb 10/28/2015  . ESRD on dialysis ([place]) 10/28/2015  . Essential hypertension 10/28/2015  . ESRD on hemodialysis ([place]) 03/29/2015  . HTN (hypertension) 03/29/2015  . COPD (chronic obstructive pulmonary disease) ([place]) 03/29/2015  . GAD (generalized anxiety disorder) 03/29/2015    Past Surgical History:  Procedure Laterality Date  . ABDOMINAL HYSTERECTOMY    . COLONOSCOPY  2011  . COLONOSCOPY WITH PROPOFOL N/A 09/20/2015   Procedure: COLONOSCOPY WITH PROPOFOL;  Surgeon: Lucilla Lame, MD;  Location: [facility] ENDOSCOPY;  Service: Endoscopy;  Laterality: N/A;  . DIALYSIS FISTULA CREATION    . ESOPHAGOGASTRODUODENOSCOPY N/A 07/13/2016   Procedure: ESOPHAGOGASTRODUODENOSCOPY (EGD);  Surgeon: Lollie Sails, MD;  Location: [facility] ENDOSCOPY;  Service: Endoscopy;  Laterality: N/A;  . ESOPHAGOGASTRODUODENOSCOPY (EGD) WITH PROPOFOL N/A 06/02/2016   Procedure: ESOPHAGOGASTRODUODENOSCOPY (EGD) WITH PROPOFOL;  Surgeon: Manya Silvas, MD;  Location: [facility] ENDOSCOPY;  Service: Endoscopy;  Laterality: N/A;  . PERIPHERAL VASCULAR CATHETERIZATION N/A 05/10/2015   Procedure: A/V Shuntogram/Fistulagram;  Surgeon: Katha Cabal, MD;  Location: [place] CV LAB;  Service: Cardiovascular;  Laterality: N/A;  . PERIPHERAL VASCULAR CATHETERIZATION Left 05/10/2015   Procedure: A/V Shunt Intervention;  Surgeon: Katha Cabal, MD;  Location: [place] CV LAB;  Service: Cardiovascular;  Laterality: Left;  . PERIPHERAL VASCULAR CATHETERIZATION Left 08/23/2015   Procedure: A/V Shuntogram/Fistulagram;  Surgeon: Katha Cabal, MD;  Location: [facility]  INVASIVE CV LAB;  Service: Cardiovascular;  Laterality: Left;  . PERIPHERAL VASCULAR CATHETERIZATION N/A 08/23/2015   Procedure: A/V Shunt Intervention;  Surgeon: Katha Cabal, MD;  Location: [place] CV LAB;  Service: Cardiovascular;  Laterality: N/A;  .  PERIPHERAL VASCULAR CATHETERIZATION  08/23/2015   Procedure: Dialysis/Perma Catheter Insertion;  Surgeon: Katha Cabal, MD;  Location: [place] CV LAB;  Service: Cardiovascular;;  . PERIPHERAL VASCULAR CATHETERIZATION N/A 01/03/2016   Procedure: Dialysis/Perma Catheter Removal;  Surgeon: Katha Cabal, MD;  Location: [place] CV LAB;  Service: Cardiovascular;  Laterality: N/A;  . REVISON OF ARTERIOVENOUS FISTULA Left 10/28/2015   Procedure: REVISON OF ARTERIOVENOUS FISTULA WITH ARTEGRAFT;  Surgeon: Katha Cabal, MD;  Location: [facility] ORS;  Service: Vascular;  Laterality: Left;  . WOUND DEBRIDEMENT Left 10/28/2015   Procedure: Resection of shoulder cyst ( left );  Surgeon: Katha Cabal, MD;  Location: [facility] ORS;  Service: Vascular;  Laterality: Left;    Prior to Admission medications   Medication Sig Start Date End Date Taking? Authorizing Provider  acetaminophen (TYLENOL) 325 MG tablet Take 2 tablets (650 mg total) by mouth every 6 (six) hours as needed for mild pain (or Fever >/= 101). 12/11/16   Nicholes Mango, MD  albuterol (PROVENTIL HFA;VENTOLIN HFA) 108 (90 Base) MCG/ACT inhaler Inhale 2 puffs into the lungs every 6 (six) hours as needed for wheezing or shortness of breath. 12/11/16   Nicholes Mango, MD  ALPRAZolam Duanne Moron) 0.5 MG tablet Take 0.5 mg by mouth 2 (two) times daily.    Historical Provider, MD  Amino Acids-Protein Hydrolys (FEEDING SUPPLEMENT, PRO-STAT SUGAR FREE 64,) LIQD Take 30 mLs by mouth daily. 12/12/16   Nicholes Mango, MD  amLODipine (NORVASC) 10 MG tablet Take 10 mg by mouth daily.     Historical Provider, MD  calcium acetate (PHOSLO) 667 MG capsule Take 1,334 mg by mouth 3 (three) times daily.     Historical Provider, MD  carvedilol (COREG) 12.5 MG tablet Take 12.5 mg by mouth 2 (two) times daily with a meal.    Historical Provider, MD  cinacalcet (SENSIPAR) 30 MG tablet Take 60 mg by mouth daily with breakfast.     Historical Provider, MD  ezetimibe (ZETIA) 10  MG tablet Take 10 mg by mouth daily.     Historical Provider, MD  feeding supplement, ENSURE ENLIVE, (ENSURE ENLIVE) LIQD Take 237 mLs by mouth 3 (three) times daily between meals. 10/11/16   [name], MD  fluticasone (FLONASE) 50 MCG/ACT nasal spray Place 2 sprays into both nostrils daily as needed for rhinitis.    Historical Provider, MD  furosemide (LASIX) 40 MG tablet Take 40 mg by mouth every Tuesday, Thursday, Saturday, and Sunday.     Historical Provider, MD  HYDROcodone-acetaminophen (NORCO/VICODIN) 5-325 MG tablet Take 1 tablet by mouth every 6 (six) hours as needed for moderate pain. 03/24/17   Lisa Roca, MD  ipratropium-albuterol (DUONEB) 0.5-2.5 (3) MG/3ML SOLN Take 3 mLs by nebulization every 6 (six) hours as needed. 12/11/16   Nicholes Mango, MD  lidocaine-prilocaine (EMLA) cream Apply 1 application topically as needed (prior to accessing port).    Historical Provider, MD  losartan (COZAAR) 100 MG tablet Take 100 mg by mouth daily.     Historical Provider, MD  multivitamin (RENA-VIT) TABS tablet Take 1 tablet by mouth at bedtime.     Historical Provider, MD  ondansetron (ZOFRAN) 4 MG tablet Take 1 tablet (4 mg total) by mouth every 8 (eight) hours  as needed for nausea or vomiting. 03/24/17   Lisa Roca, MD  pantoprazole (PROTONIX) 40 MG tablet Take 40 mg by mouth 2 (two) times daily.    Historical Provider, MD  senna-docusate (SENOKOT-S) 8.6-50 MG tablet Take 1 tablet by mouth at bedtime as needed for mild constipation. 12/11/16   Nicholes Mango, MD  sertraline (ZOLOFT) 50 MG tablet Take 50 mg by mouth daily.     Historical Provider, MD  traMADol (ULTRAM) 50 MG tablet Take 1 tablet (50 mg total) by mouth every 12 (twelve) hours as needed for moderate pain. 12/11/16   Nicholes Mango, MD    Allergies  Allergen Reactions  . Chantix [Varenicline]     hallucinations  . Sulfa Antibiotics Itching, Swelling, Rash and Other (See Comments)    Reaction:  Facial/body swelling     Family  History  Problem Relation Age of Onset  . Heart disease Mother   . Cancer Father   . Cancer Sister     Social History Social History  Substance Use Topics  . Smoking status: Current Every Day Smoker    Packs/day: 0.50    Years: 40.00    Types: Cigarettes    Last attempt to quit: 10/21/2016  . Smokeless tobacco: Never Used  . Alcohol use No    Review of Systems  Constitutional: Negative for fever. Eyes: Negative for visual changes. ENT: Negative for sore throat. Cardiovascular: Positive as per history of present illness for chest pain. Respiratory: Negative for any acute shortness of breath. Gastrointestinal: Negative for vomiting and diarrhea. Genitourinary: Negative for dysuria. Musculoskeletal: Right-sided lower chest/upper flank discomfort with movement. Skin: Negative for rash. Neurological: Negative for headache. 10 point Review of Systems otherwise negative ____________________________________________   PHYSICAL EXAM:  VITAL SIGNS: ED Triage Vitals  Enc Vitals Group     BP 03/24/17 0823 (!) 172/96     Pulse Rate 03/24/17 0823 73     Resp 03/24/17 0823 19     Temp 03/24/17 0823 98.6 F (37 C)     Temp Source 03/24/17 0823 Oral     SpO2 03/24/17 0823 100 %     Weight 03/24/17 0824 125 lb (56.7 kg)     Height 03/24/17 0824 5\' 5"  (1.651 m)     Head Circumference --      Peak Flow --      Pain Score 03/24/17 0823 8     Pain Loc --      Pain Edu? --      Excl. in [place]? --      Constitutional: Alert and oriented. Well appearing and in no distress. HEENT   Head: Normocephalic and atraumatic.      Eyes: Conjunctivae are normal. PERRL. Normal extraocular movements.      Ears:         Nose: No congestion/rhinnorhea.  Wearing nasal cannula oxygen.   Mouth/Throat: Mucous membranes are moist.   Neck: No stridor. Cardiovascular/Chest: Normal rate, regular rhythm.  No murmurs, rubs, or gallops. Respiratory: Normal respiratory effort without tachypnea  nor retractions. Breath sounds are clear and equal bilaterally. No wheezes/rales/rhonchi. Gastrointestinal: Soft. No distention, no guarding, no rebound. Moderate tenderness epigastric and right upper quadrant.   Genitourinary/rectal:Deferred Musculoskeletal: Left upper arm fistula. Tender to AP compression of the chest wall over the right side. Pain when she turns her chest towards the right side of the lower chest or upper abdomen. Neurologic:  Normal speech and language. No gross or focal neurologic deficits are appreciated. Skin:  Skin is warm, dry and intact. No rash noted. Psychiatric: Mood and affect are normal. Speech and behavior are normal. Patient exhibits appropriate insight and judgment.   ____________________________________________  LABS (pertinent positives/negatives)  Labs Reviewed  LIPASE, BLOOD - Abnormal; Notable for the following:       Result Value   Lipase 90 (*)    All other components within normal limits  COMPREHENSIVE METABOLIC PANEL - Abnormal; Notable for the following:    Potassium 5.6 (*)    Chloride 99 (*)    Glucose, Bld 129 (*)    BUN 62 (*)    Creatinine, Ser 8.80 (*)    Calcium 7.6 (*)    Alkaline Phosphatase 183 (*)    GFR calc non Af Amer 4 (*)    GFR calc Af Amer 5 (*)    All other components within normal limits  CBC - Abnormal; Notable for the following:    RBC 3.35 (*)    Hemoglobin 10.7 (*)    HCT 33.2 (*)    RDW 16.6 (*)    All other components within normal limits  TROPONIN I - Abnormal; Notable for the following:    Troponin I 0.04 (*)    All other components within normal limits    ____________________________________________    EKG I, Lisa Roca, MD, the attending physician have personally viewed and interpreted all ECGs.  71 bpm. Normal sinus rhythm. Narrow QRS. Normal axis. Nonspecific ST and T-wave ____________________________________________  RADIOLOGY All Xrays were viewed by me. Imaging interpreted by  Radiologist.  Chest x-ray two-view:  IMPRESSION: Few densities at the left lung base may represent atelectasis. No large areas of airspace disease or pulmonary edema.  Mild cardiomegaly.   Ultrasound right upper quadrant: IMPRESSION: 1. No sonographic evidence of acute cholecystitis. 2. 7 mm echogenic nonmobile focus along the gallbladder wall which may reflect a gallbladder polyp versus a gallstone adherent to the wall.  __________________________________________  PROCEDURES  Procedure(s) performed: None  Critical Care performed: None  ____________________________________________   ED COURSE / ASSESSMENT AND PLAN  Pertinent labs & imaging results that were available during my care of the patient were reviewed by me and considered in my medical decision making (see chart for details).   Michele Nash is here for a lower right chest or upper right abdomen pain that is worse with movement and came on after a pain when she was hanging curtains. Some of these symptoms sound consistent with possible musculoskeletal cause. However given location, I am going to evaluate for right upper quadrant sources of discomfort as well as cardiac/pulmonary although symptoms seem less likely to be cardiac or pulmonary in nature.  Patient states that she takes Percocet at home and cannot take anti-inflammatories. I will give her Percocet here. We also discussed possible musculoskeletal nature and will treat also with muscle relaxer Flexeril for symptoms right now.   Patient feels much better and is requesting to go home at around 1:30.  I did review that her lipase is elevated and discussed the diagnosis of pancreatic tenderness. She states that she's had this before. Her ultrasound showed possible gallbladder polyp versus stone, without significant elevated LFTs, not concerned for obstructive gallstone pancreatitis at this point time. I did discuss with her observation management versus going home  and she is adamant that she wants to go home. She knows how to come back if she is getting any worse at all.  She is going onto a liquid diet and then advance  as tolerated.  I did prescribe her short-term course of Norco as well as Zofran for symptoms.  We discussed her potassium was slightly elevated and she is due for dialysis tomorrow, and she knows not to miss it tomorrow.      CONSULTATIONS:   None   Patient / Family / Caregiver informed of clinical course, medical decision-making process, and agree with plan.   I discussed return precautions, follow-up instructions, and discharge instructions with patient and/or family.   ___________________________________________   FINAL CLINICAL IMPRESSION(S) / ED DIAGNOSES   Final diagnoses:  RUQ pain  Idiopathic acute pancreatitis, unspecified complication status              Note: This dictation was prepared with Dragon dictation. Any transcriptional errors that result from this process are unintentional    Lisa Roca, MD 03/24/17 1359

## 2017-04-16 ENCOUNTER — Ambulatory Visit: Payer: Medicare Other | Admitting: Surgery

## 2017-04-24 ENCOUNTER — Encounter: Payer: Self-pay | Admitting: General Surgery

## 2017-04-25 ENCOUNTER — Telehealth: Payer: Self-pay | Admitting: General Practice

## 2017-04-25 ENCOUNTER — Ambulatory Visit: Payer: Medicare Other | Admitting: General Surgery

## 2017-04-25 NOTE — Telephone Encounter (Signed)
Left patient a message to call the office back to reschedule her no showed appointment for today 04/25/17. If patient calls back please reschedule no showed appointment.

## 2017-04-30 ENCOUNTER — Encounter: Payer: Self-pay | Admitting: Surgery

## 2017-04-30 ENCOUNTER — Ambulatory Visit (INDEPENDENT_AMBULATORY_CARE_PROVIDER_SITE_OTHER): Payer: Medicare Other | Admitting: Surgery

## 2017-04-30 VITALS — BP 208/109 | HR 109 | Temp 96.8°F | Ht 62.0 in | Wt 128.0 lb

## 2017-04-30 DIAGNOSIS — G8929 Other chronic pain: Secondary | ICD-10-CM

## 2017-04-30 DIAGNOSIS — R1011 Right upper quadrant pain: Secondary | ICD-10-CM | POA: Diagnosis not present

## 2017-04-30 NOTE — Patient Instructions (Signed)
Please call your primary care doctor and schedule an appointment with him. Please call our office if you have any questions or concerns.

## 2017-04-30 NOTE — Progress Notes (Signed)
Michele Nash is an 61 y.o. female.   Chief Complaint: Pancreatitis HPI: This is a patient who is in the emergency room recently with right upper quadrant and epigastric pain but no real gallstones seen on ultrasound. She had an elevated lipase at 90 which is not very elevated.  She is on dialysis and has multiple medical problems. She also has severe COPD and is on home oxygen. He actually has no abdominal pain today and no nausea or vomiting.  Past Medical History:  Diagnosis Date  . Altered mental status   . Anemia   . Apnea, sleep    for sleep study 10/17/15-no cpap yet  . Arthritis   . Bronchitis   . Chronic kidney disease   . COPD (chronic obstructive pulmonary disease) ([place])   . Depression   . Dialysis patient ([place]) 2014  . GERD (gastroesophageal reflux disease)   . GIB (gastrointestinal bleeding) 07/09/2016  . Headache   . Hyperlipidemia   . Hypertension   . Obesity   . Pulmonary edema 09/14/2016  . Renal dialysis device, implant, or graft complication    RIGHT CHEST CATH  . Respiratory failure ([place]) 07/23/2016  . Traumatic hematoma of forehead   . Type II diabetes mellitus ([place]) 09/21/2009    Past Surgical History:  Procedure Laterality Date  . ABDOMINAL HYSTERECTOMY    . COLONOSCOPY  2011  . COLONOSCOPY WITH PROPOFOL N/A 09/20/2015   Procedure: COLONOSCOPY WITH PROPOFOL;  Surgeon: Lucilla Lame, MD;  Location: [facility] ENDOSCOPY;  Service: Endoscopy;  Laterality: N/A;  . DIALYSIS FISTULA CREATION    . ESOPHAGOGASTRODUODENOSCOPY N/A 07/13/2016   Procedure: ESOPHAGOGASTRODUODENOSCOPY (EGD);  Surgeon: Lollie Sails, MD;  Location: [facility] ENDOSCOPY;  Service: Endoscopy;  Laterality: N/A;  . ESOPHAGOGASTRODUODENOSCOPY (EGD) WITH PROPOFOL N/A 06/02/2016   Procedure: ESOPHAGOGASTRODUODENOSCOPY (EGD) WITH PROPOFOL;  Surgeon: Manya Silvas, MD;  Location: [facility] ENDOSCOPY;  Service: Endoscopy;  Laterality: N/A;  . PERIPHERAL VASCULAR CATHETERIZATION N/A 05/10/2015   Procedure:  A/V Shuntogram/Fistulagram;  Surgeon: Katha Cabal, MD;  Location: [place] CV LAB;  Service: Cardiovascular;  Laterality: N/A;  . PERIPHERAL VASCULAR CATHETERIZATION Left 05/10/2015   Procedure: A/V Shunt Intervention;  Surgeon: Katha Cabal, MD;  Location: [place] CV LAB;  Service: Cardiovascular;  Laterality: Left;  . PERIPHERAL VASCULAR CATHETERIZATION Left 08/23/2015   Procedure: A/V Shuntogram/Fistulagram;  Surgeon: Katha Cabal, MD;  Location: [place] CV LAB;  Service: Cardiovascular;  Laterality: Left;  . PERIPHERAL VASCULAR CATHETERIZATION N/A 08/23/2015   Procedure: A/V Shunt Intervention;  Surgeon: Katha Cabal, MD;  Location: [place] CV LAB;  Service: Cardiovascular;  Laterality: N/A;  . PERIPHERAL VASCULAR CATHETERIZATION  08/23/2015   Procedure: Dialysis/Perma Catheter Insertion;  Surgeon: Katha Cabal, MD;  Location: [place] CV LAB;  Service: Cardiovascular;;  . PERIPHERAL VASCULAR CATHETERIZATION N/A 01/03/2016   Procedure: Dialysis/Perma Catheter Removal;  Surgeon: Katha Cabal, MD;  Location: [place] CV LAB;  Service: Cardiovascular;  Laterality: N/A;  . REVISON OF ARTERIOVENOUS FISTULA Left 10/28/2015   Procedure: REVISON OF ARTERIOVENOUS FISTULA WITH ARTEGRAFT;  Surgeon: Katha Cabal, MD;  Location: [facility] ORS;  Service: Vascular;  Laterality: Left;  . WOUND DEBRIDEMENT Left 10/28/2015   Procedure: Resection of shoulder cyst ( left );  Surgeon: Katha Cabal, MD;  Location: [facility] ORS;  Service: Vascular;  Laterality: Left;    Family History  Problem Relation Age of Onset  . Heart disease Mother   . Cancer Father   .  Cancer Sister    Social History:  reports that she has been smoking Cigarettes.  She has a 20.00 pack-year smoking history. She has never used smokeless tobacco. She reports that she does not drink alcohol or use drugs.  Allergies:  Allergies  Allergen Reactions  . Chantix [Varenicline]      hallucinations  . Sulfa Antibiotics Itching, Swelling, Rash and Other (See Comments)    Reaction:  Facial/body swelling      (Not in a hospital admission)   Review of Systems:   Review of Systems  Constitutional: Negative.   HENT: Negative.   Eyes: Negative.   Respiratory: Positive for cough, hemoptysis, sputum production, shortness of breath and wheezing.   Cardiovascular: Positive for chest pain and PND.       Right-sided chest pain  Gastrointestinal: Positive for abdominal pain and nausea. Negative for blood in stool, constipation, diarrhea, heartburn and vomiting.  Genitourinary: Negative.   Musculoskeletal: Negative.   Skin: Negative.   Neurological: Negative.   Endo/Heme/Allergies: Negative.   Psychiatric/Behavioral: Negative.     Physical Exam:  Physical Exam  Constitutional: She is oriented to person, place, and time. No distress.  Visibly short of breath on home oxygen she cannot complete a sentence. She has a left brachial AV fistula which is dressed Both diastolic and systolic blood pressures are well above normal  HENT:  Head: Normocephalic and atraumatic.  Eyes: Pupils are equal, round, and reactive to light. Right eye exhibits no discharge. Left eye exhibits no discharge. No scleral icterus.  Neck: Normal range of motion. JVD present.  Cardiovascular: Normal rate, regular rhythm and normal heart sounds.   Pulmonary/Chest: No respiratory distress. She has wheezes. She has no rales.  Abdominal: Soft. She exhibits distension. There is no tenderness. There is no rebound and no guarding.  Pfannenstiel scar. Murphy sign negative. No tenderness.  Musculoskeletal: Normal range of motion. She exhibits edema. She exhibits no tenderness.  Lymphadenopathy:    She has no cervical adenopathy.  Neurological: She is alert and oriented to person, place, and time.  Skin: Skin is warm and dry. No rash noted. She is not diaphoretic. No erythema.  Psychiatric: Affect normal.   Vitals reviewed.   There were no vitals taken for this visit.    No results found for this or any previous visit (from the past 48 hour(s)). No results found.   Assessment/Plan Ultrasound shows a fixed stone or a polyp in the gallbladder but no pericholecystic changes to suggest acute cholecystitis. This fixed stone or polyp is not in the neck of the gallbladder.  LFTs were normal but lipase was slightly elevated at 90 normal being 57 or below.  In the absence of clear signs of edema of the gallbladder or biliary ductal dilatation or multiple stones and in the absence of elevated LFTs it is unreasonable to think that this mild elevation in the lipase is related to her gallbladder. She also has extreme COPD on home oxygen and now has hemoptysis. I have recommended that she follow-up with her primary caregivers at this point for her other medical problems. I see no need to recommend surgical intervention for the gallbladder. I believe that she is at extreme risk for an anesthetic and in the absence of acute life-threatening problems of the gallbladder or abdomen I would not recommend a general anesthetic for any sort of surgical intervention at this time. This was reviewed with her.  Florene Glen, MD, FACS

## 2017-05-09 ENCOUNTER — Encounter: Payer: Self-pay | Admitting: General Surgery

## 2017-05-09 ENCOUNTER — Ambulatory Visit: Payer: Medicare Other | Admitting: Gastroenterology

## 2017-05-09 ENCOUNTER — Ambulatory Visit (INDEPENDENT_AMBULATORY_CARE_PROVIDER_SITE_OTHER): Payer: Medicare Other | Admitting: General Surgery

## 2017-05-09 ENCOUNTER — Encounter: Payer: Self-pay | Admitting: *Deleted

## 2017-05-09 VITALS — BP 164/76 | HR 70 | Resp 20 | Ht 64.0 in | Wt 128.0 lb

## 2017-05-09 DIAGNOSIS — R222 Localized swelling, mass and lump, trunk: Secondary | ICD-10-CM

## 2017-05-09 NOTE — Progress Notes (Signed)
Patient ID: Michele Nash, female   DOB: 21-Sep-1956, 61 y.o.   MRN: [id number]  Chief Complaint  Patient presents with  . Follow-up    HPI Michele Nash is a 61 y.o. female here today for a evaluation of a lipoma on her lower back. She noticed this about a month ago. No pain or change in size. No prior such lumps HPI  Past Medical History:  Diagnosis Date  . Altered mental status   . Anemia   . Apnea, sleep    for sleep study 10/17/15-no cpap yet  . Arthritis   . Bronchitis   . Chronic kidney disease   . COPD (chronic obstructive pulmonary disease) ([place])   . Depression   . Dialysis patient ([place]) 2014  . GERD (gastroesophageal reflux disease)   . GIB (gastrointestinal bleeding) 07/09/2016  . Headache   . Hyperlipidemia   . Hypertension   . Obesity   . Pulmonary edema 09/14/2016  . Renal dialysis device, implant, or graft complication    RIGHT CHEST CATH  . Respiratory failure ([place]) 07/23/2016  . Traumatic hematoma of forehead   . Type II diabetes mellitus ([place]) 09/21/2009    Past Surgical History:  Procedure Laterality Date  . ABDOMINAL HYSTERECTOMY    . COLONOSCOPY  2011  . COLONOSCOPY WITH PROPOFOL N/A 09/20/2015   Procedure: COLONOSCOPY WITH PROPOFOL;  Surgeon: Lucilla Lame, MD;  Location: [facility] ENDOSCOPY;  Service: Endoscopy;  Laterality: N/A;  . DIALYSIS FISTULA CREATION    . ESOPHAGOGASTRODUODENOSCOPY N/A 07/13/2016   Procedure: ESOPHAGOGASTRODUODENOSCOPY (EGD);  Surgeon: Lollie Sails, MD;  [facility] ENDOSCOPY;  Service: Endoscopy;  Laterality: N/A;  . ESOPHAGOGASTRODUODENOSCOPY (EGD) WITH PROPOFOL N/A 06/02/2016   Procedure: ESOPHAGOGASTRODUODENOSCOPY (EGD) WITH PROPOFOL;  Surgeon: Manya Silvas, MD;  Location: [facility] ENDOSCOPY;  Service: Endoscopy;  Laterality: N/A;  . PERIPHERAL VASCULAR CATHETERIZATION N/A 05/10/2015   Procedure: A/V Shuntogram/Fistulagram;  Surgeon: Katha Cabal, MD;  Location: [place] CV LAB;  Service: Cardiovascular;   Laterality: N/A;  . PERIPHERAL VASCULAR CATHETERIZATION Left 05/10/2015   Procedure: A/V Shunt Intervention;  Surgeon: Katha Cabal, MD;  Location: [place] CV LAB;  Service: Cardiovascular;  Laterality: Left;  . PERIPHERAL VASCULAR CATHETERIZATION Left 08/23/2015   Procedure: A/V Shuntogram/Fistulagram;  Surgeon: Katha Cabal, MD;  Location: [place] CV LAB;  Service: Cardiovascular;  Laterality: Left;  . PERIPHERAL VASCULAR CATHETERIZATION N/A 08/23/2015   Procedure: A/V Shunt Intervention;  Surgeon: Katha Cabal, MD;  Location: [place] CV LAB;  Service: Cardiovascular;  Laterality: N/A;  . PERIPHERAL VASCULAR CATHETERIZATION  08/23/2015   Procedure: Dialysis/Perma Catheter Insertion;  Surgeon: Katha Cabal, MD;  Location: [place] CV LAB;  Service: Cardiovascular;;  . PERIPHERAL VASCULAR CATHETERIZATION N/A 01/03/2016   Procedure: Dialysis/Perma Catheter Removal;  Surgeon: Katha Cabal, MD;  Location: [place] CV LAB;  Service: Cardiovascular;  Laterality: N/A;  . REVISON OF ARTERIOVENOUS FISTULA Left 10/28/2015   Procedure: REVISON OF ARTERIOVENOUS FISTULA WITH ARTEGRAFT;  Surgeon: Katha Cabal, MD;  Location: [facility] ORS;  Service: Vascular;  Laterality: Left;  . WOUND DEBRIDEMENT Left 10/28/2015   Procedure: Resection of shoulder cyst ( left );  Surgeon: Katha Cabal, MD;  Location: [facility] ORS;  Service: Vascular;  Laterality: Left;    Family History  Problem Relation Age of Onset  . Heart disease Mother   . Cancer Father   . Cancer Sister     Social History Social History  Substance Use Topics  .  Smoking status: Current Every Day Smoker    Packs/day: 0.50    Years: 40.00    Types: Cigarettes    Last attempt to quit: 10/21/2016  . Smokeless tobacco: Never Used  . Alcohol use No    Allergies  Allergen Reactions  . Chantix [Varenicline]     hallucinations  . Sulfa Antibiotics Itching, Swelling, Rash and Other (See Comments)      Reaction:  Facial/body swelling     Current Outpatient Prescriptions  Medication Sig Dispense Refill  . acetaminophen (TYLENOL) 325 MG tablet Take 2 tablets (650 mg total) by mouth every 6 (six) hours as needed for mild pain (or Fever >/= 101).    Marland Kitchen albuterol (PROVENTIL HFA;VENTOLIN HFA) 108 (90 Base) MCG/ACT inhaler Inhale 2 puffs into the lungs every 6 (six) hours as needed for wheezing or shortness of breath. 1 Inhaler 1  . ALPRAZolam (XANAX) 0.5 MG tablet Take 0.5 mg by mouth 2 (two) times daily.    . Amino Acids-Protein Hydrolys (FEEDING SUPPLEMENT, PRO-STAT SUGAR FREE 64,) LIQD Take 30 mLs by mouth daily. 900 mL 0  . amLODipine (NORVASC) 10 MG tablet Take 10 mg by mouth daily.     . calcium acetate (PHOSLO) 667 MG capsule Take 1,334 mg by mouth 3 (three) times daily.     . carvedilol (COREG) 12.5 MG tablet Take 12.5 mg by mouth 2 (two) times daily with a meal.    . cinacalcet (SENSIPAR) 30 MG tablet Take 60 mg by mouth daily with breakfast.     . ezetimibe (ZETIA) 10 MG tablet Take 10 mg by mouth daily.     . feeding supplement, ENSURE ENLIVE, (ENSURE ENLIVE) LIQD Take 237 mLs by mouth 3 (three) times daily between meals. 237 mL 12  . fluticasone (FLONASE) 50 MCG/ACT nasal spray Place 2 sprays into both nostrils daily as needed for rhinitis.    . furosemide (LASIX) 40 MG tablet Take 40 mg by mouth every Tuesday, Thursday, Saturday, and Sunday.     Marland Kitchen ipratropium-albuterol (DUONEB) 0.5-2.5 (3) MG/3ML SOLN Take 3 mLs by nebulization every 6 (six) hours as needed. 360 mL 0  . lidocaine-prilocaine (EMLA) cream Apply 1 application topically as needed (prior to accessing port).    . losartan (COZAAR) 100 MG tablet Take 100 mg by mouth daily.     . multivitamin (RENA-VIT) TABS tablet Take 1 tablet by mouth at bedtime.     . ondansetron (ZOFRAN) 4 MG tablet Take 1 tablet (4 mg total) by mouth every 8 (eight) hours as needed for nausea or vomiting. 10 tablet 0  . pantoprazole (PROTONIX) 40 MG  tablet Take 40 mg by mouth 2 (two) times daily.    Marland Kitchen senna-docusate (SENOKOT-S) 8.6-50 MG tablet Take 1 tablet by mouth at bedtime as needed for mild constipation.    . sertraline (ZOLOFT) 50 MG tablet Take 50 mg by mouth daily.     . traMADol (ULTRAM) 50 MG tablet Take 1 tablet (50 mg total) by mouth every 12 (twelve) hours as needed for moderate pain. 25 tablet 0   No current facility-administered medications for this visit.     Review of Systems Review of Systems  Constitutional: Negative.   Respiratory: Negative.   Cardiovascular: Negative.     Blood pressure (!) 164/76, pulse 70, resp. rate 20, height 5\' 4"  (1.626 m), weight 128 lb (58.1 kg), SpO2 90 %.  Physical Exam Physical Exam  Constitutional: She is oriented to person, place, and time. She appears  well-developed and well-nourished.  Neurological: She is alert and oriented to person, place, and time.  Skin: Skin is warm and dry.     Psychiatric: She has a normal mood and affect. Her behavior is normal.    Data Reviewed Notes reviewed   Assessment    Prominence of subcutaneous tissue- two soft, mobile masses found in either side of mid-lumbar region- patient is asymptomatic. Monitor for now, no intervention required. Stable exam otherwise.    Plan     Return as needed. Patient advised to call with any questions or concerns.  HPI, Physical Exam, Assessment and Plan have been scribed under the direction and in the presence of Mckinley Jewel, MD  Gaspar Cola, CMA     I have completed the exam and reviewed the above documentation for accuracy and completeness.  I agree with the above.  Haematologist has been used and any errors in dictation or transcription are unintentional.  Seeplaputhur G. Jamal Collin, M.D., F.A.C.S.  Junie Panning G 05/09/2017, 11:54 AM

## 2017-05-09 NOTE — Patient Instructions (Addendum)
Return as needed. Patient advised to call with any questions or concerns.

## 2017-05-20 ENCOUNTER — Other Ambulatory Visit (INDEPENDENT_AMBULATORY_CARE_PROVIDER_SITE_OTHER): Payer: Self-pay | Admitting: Vascular Surgery

## 2017-05-20 DIAGNOSIS — N185 Chronic kidney disease, stage 5: Secondary | ICD-10-CM

## 2017-05-21 ENCOUNTER — Encounter (INDEPENDENT_AMBULATORY_CARE_PROVIDER_SITE_OTHER): Payer: Self-pay | Admitting: Vascular Surgery

## 2017-05-21 ENCOUNTER — Ambulatory Visit (INDEPENDENT_AMBULATORY_CARE_PROVIDER_SITE_OTHER): Payer: Medicare Other | Admitting: Vascular Surgery

## 2017-05-21 ENCOUNTER — Ambulatory Visit (INDEPENDENT_AMBULATORY_CARE_PROVIDER_SITE_OTHER): Payer: Medicare Other

## 2017-05-21 ENCOUNTER — Encounter: Payer: Self-pay | Admitting: Emergency Medicine

## 2017-05-21 ENCOUNTER — Emergency Department: Payer: Medicare Other

## 2017-05-21 ENCOUNTER — Other Ambulatory Visit (INDEPENDENT_AMBULATORY_CARE_PROVIDER_SITE_OTHER): Payer: Self-pay | Admitting: Vascular Surgery

## 2017-05-21 ENCOUNTER — Inpatient Hospital Stay
Admission: EM | Admit: 2017-05-21 | Discharge: 2017-05-22 | DRG: 640 | Disposition: A | Discharge status: Home / Self Care | Payer: Medicare Other | Attending: Internal Medicine | Admitting: Internal Medicine

## 2017-05-21 VITALS — BP 189/90 | HR 68 | Resp 18 | Ht 64.5 in | Wt 137.0 lb

## 2017-05-21 DIAGNOSIS — I700 Atherosclerosis of aorta: Secondary | ICD-10-CM | POA: Diagnosis not present

## 2017-05-21 DIAGNOSIS — N185 Chronic kidney disease, stage 5: Secondary | ICD-10-CM

## 2017-05-21 DIAGNOSIS — Z794 Long term (current) use of insulin: Secondary | ICD-10-CM

## 2017-05-21 DIAGNOSIS — F1410 Cocaine abuse, uncomplicated: Secondary | ICD-10-CM | POA: Diagnosis not present

## 2017-05-21 DIAGNOSIS — E118 Type 2 diabetes mellitus with unspecified complications: Secondary | ICD-10-CM | POA: Diagnosis not present

## 2017-05-21 DIAGNOSIS — N186 End stage renal disease: Secondary | ICD-10-CM

## 2017-05-21 DIAGNOSIS — E669 Obesity, unspecified: Secondary | ICD-10-CM | POA: Diagnosis not present

## 2017-05-21 DIAGNOSIS — Z992 Dependence on renal dialysis: Secondary | ICD-10-CM

## 2017-05-21 DIAGNOSIS — Z888 Allergy status to other drugs, medicaments and biological substances status: Secondary | ICD-10-CM | POA: Diagnosis not present

## 2017-05-21 DIAGNOSIS — F17210 Nicotine dependence, cigarettes, uncomplicated: Secondary | ICD-10-CM | POA: Diagnosis present

## 2017-05-21 DIAGNOSIS — I10 Essential (primary) hypertension: Secondary | ICD-10-CM | POA: Diagnosis not present

## 2017-05-21 DIAGNOSIS — G4730 Sleep apnea, unspecified: Secondary | ICD-10-CM | POA: Diagnosis present

## 2017-05-21 DIAGNOSIS — J449 Chronic obstructive pulmonary disease, unspecified: Secondary | ICD-10-CM | POA: Diagnosis not present

## 2017-05-21 DIAGNOSIS — Z79899 Other long term (current) drug therapy: Secondary | ICD-10-CM

## 2017-05-21 DIAGNOSIS — Z9115 Patient's noncompliance with renal dialysis: Secondary | ICD-10-CM

## 2017-05-21 DIAGNOSIS — E875 Hyperkalemia: Secondary | ICD-10-CM | POA: Diagnosis present

## 2017-05-21 DIAGNOSIS — E782 Mixed hyperlipidemia: Secondary | ICD-10-CM | POA: Diagnosis not present

## 2017-05-21 DIAGNOSIS — E785 Hyperlipidemia, unspecified: Secondary | ICD-10-CM | POA: Diagnosis present

## 2017-05-21 DIAGNOSIS — Z7951 Long term (current) use of inhaled steroids: Secondary | ICD-10-CM

## 2017-05-21 DIAGNOSIS — K219 Gastro-esophageal reflux disease without esophagitis: Secondary | ICD-10-CM | POA: Diagnosis not present

## 2017-05-21 DIAGNOSIS — Z882 Allergy status to sulfonamides status: Secondary | ICD-10-CM

## 2017-05-21 DIAGNOSIS — I132 Hypertensive heart and chronic kidney disease with heart failure and with stage 5 chronic kidney disease, or end stage renal disease: Secondary | ICD-10-CM | POA: Diagnosis not present

## 2017-05-21 DIAGNOSIS — I5032 Chronic diastolic (congestive) heart failure: Secondary | ICD-10-CM | POA: Diagnosis present

## 2017-05-21 DIAGNOSIS — F329 Major depressive disorder, single episode, unspecified: Secondary | ICD-10-CM | POA: Diagnosis present

## 2017-05-21 DIAGNOSIS — N2581 Secondary hyperparathyroidism of renal origin: Secondary | ICD-10-CM | POA: Diagnosis not present

## 2017-05-21 DIAGNOSIS — E1122 Type 2 diabetes mellitus with diabetic chronic kidney disease: Secondary | ICD-10-CM | POA: Diagnosis present

## 2017-05-21 DIAGNOSIS — D631 Anemia in chronic kidney disease: Secondary | ICD-10-CM | POA: Diagnosis present

## 2017-05-21 LAB — BASIC METABOLIC PANEL
Anion gap: 14 (ref 5–15)
Anion gap: 11 (ref 5–15)
BUN: 96 mg/dL — ABNORMAL HIGH (ref 6–20)
BUN: 41 mg/dL — AB (ref 6–20)
CALCIUM: 7.3 mg/dL — AB (ref 8.9–10.3)
CALCIUM: 7.7 mg/dL — AB (ref 8.9–10.3)
CHLORIDE: 97 mmol/L — AB (ref 101–111)
CO2: 25 mmol/L (ref 22–32)
CO2: 31 mmol/L (ref 22–32)
CREATININE: 14.15 mg/dL — AB (ref 0.44–1.00)
CREATININE: 7.38 mg/dL — AB (ref 0.44–1.00)
Chloride: 98 mmol/L — ABNORMAL LOW (ref 101–111)
GFR calc non Af Amer: 5 mL/min — ABNORMAL LOW (ref >60)
GFR, EST AFRICAN AMERICAN: 3 mL/min — AB (ref >60)
GFR, EST AFRICAN AMERICAN: 6 mL/min — AB (ref >60)
GFR, EST NON AFRICAN AMERICAN: 2 mL/min — AB (ref >60)
GLUCOSE: 78 mg/dL (ref 65–99)
Glucose, Bld: 92 mg/dL (ref 65–99)
Potassium: 6.5 mmol/L (ref 3.5–5.1)
Potassium: 3.9 mmol/L (ref 3.5–5.1)
Sodium: 137 mmol/L (ref 135–145)
Sodium: 139 mmol/L (ref 135–145)

## 2017-05-21 LAB — GLUCOSE, CAPILLARY
GLUCOSE-CAPILLARY: 82 mg/dL (ref 65–99)
GLUCOSE-CAPILLARY: 164 mg/dL — AB (ref 65–99)
Glucose-Capillary: 65 mg/dL (ref 65–99)

## 2017-05-21 LAB — CBC WITH DIFFERENTIAL/PLATELET
BASOS ABS: 0.1 10*3/uL (ref 0–0.1)
BASOS PCT: 1 %
EOS ABS: 0.2 10*3/uL (ref 0–0.7)
Eosinophils Relative: 3 %
HCT: 33.8 % — ABNORMAL LOW (ref 35.0–47.0)
Hemoglobin: 11.1 g/dL — ABNORMAL LOW (ref 12.0–16.0)
Lymphocytes Relative: 13 %
Lymphs Abs: 0.9 10*3/uL — ABNORMAL LOW (ref 1.0–3.6)
MCH: 33.1 pg (ref 26.0–34.0)
MCHC: 32.7 g/dL (ref 32.0–36.0)
MCV: 101.2 fL — ABNORMAL HIGH (ref 80.0–100.0)
MONO ABS: 0.5 10*3/uL (ref 0.2–0.9)
MONOS PCT: 8 %
NEUTROS PCT: 75 %
Neutro Abs: 5.3 10*3/uL (ref 1.4–6.5)
Platelets: 233 10*3/uL (ref 150–440)
RBC: 3.34 MIL/uL — ABNORMAL LOW (ref 3.80–5.20)
RDW: 14.6 % — AB (ref 11.5–14.5)
WBC: 7.1 10*3/uL (ref 3.6–11.0)

## 2017-05-21 LAB — HEPATIC FUNCTION PANEL
ALBUMIN: 3.8 g/dL (ref 3.5–5.0)
ALT: 14 U/L (ref 14–54)
AST: 20 U/L (ref 15–41)
Alkaline Phosphatase: 193 U/L — ABNORMAL HIGH (ref 38–126)
Bilirubin, Direct: 0.1 mg/dL (ref 0.1–0.5)
Indirect Bilirubin: 0.8 mg/dL (ref 0.3–0.9)
TOTAL PROTEIN: 7.3 g/dL (ref 6.5–8.1)
Total Bilirubin: 0.9 mg/dL (ref 0.3–1.2)

## 2017-05-21 LAB — PHOSPHORUS: Phosphorus: 3.8 mg/dL (ref 2.5–4.6)

## 2017-05-21 LAB — MRSA PCR SCREENING: MRSA by PCR: NEGATIVE

## 2017-05-21 MED ORDER — ALBUTEROL SULFATE (2.5 MG/3ML) 0.083% IN NEBU: 2.5000 mg | INHALATION_SOLUTION | Freq: Four times a day (QID) | RESPIRATORY_TRACT | Status: DC | PRN

## 2017-05-21 MED ORDER — ONDANSETRON HCL 4 MG PO TABS: 4.0000 mg | ORAL_TABLET | Freq: Four times a day (QID) | ORAL | Status: DC | PRN

## 2017-05-21 MED ORDER — LIDOCAINE-PRILOCAINE 2.5-2.5 % EX CREA: 1.0000 "application " | TOPICAL_CREAM | CUTANEOUS | Status: DC | PRN

## 2017-05-21 MED ORDER — INSULIN ASPART 100 UNIT/ML ~~LOC~~ SOLN
5.0000 [IU] | Freq: Once | SUBCUTANEOUS | Status: AC
  Administered 2017-05-21: 5 [IU] via INTRAVENOUS
  Filled 2017-05-21: qty 1

## 2017-05-21 MED ORDER — AMLODIPINE BESYLATE 10 MG PO TABS
10.0000 mg | ORAL_TABLET | Freq: Every day | ORAL | Status: DC
  Administered 2017-05-22: 10 mg via ORAL
  Filled 2017-05-21: qty 1

## 2017-05-21 MED ORDER — DOCUSATE SODIUM 100 MG PO CAPS
100.0000 mg | ORAL_CAPSULE | Freq: Two times a day (BID) | ORAL | Status: DC
  Administered 2017-05-21 – 2017-05-22 (×2): 100 mg via ORAL
  Filled 2017-05-21 (×2): qty 1

## 2017-05-21 MED ORDER — ACETAMINOPHEN 325 MG PO TABS
650.0000 mg | ORAL_TABLET | Freq: Four times a day (QID) | ORAL | Status: DC | PRN
  Administered 2017-05-22: 650 mg via ORAL
  Filled 2017-05-21: qty 2

## 2017-05-21 MED ORDER — IPRATROPIUM-ALBUTEROL 0.5-2.5 (3) MG/3ML IN SOLN
3.0000 mL | Freq: Four times a day (QID) | RESPIRATORY_TRACT | Status: DC
  Administered 2017-05-21 – 2017-05-22 (×4): 3 mL via RESPIRATORY_TRACT
  Filled 2017-05-21 (×4): qty 3

## 2017-05-21 MED ORDER — SODIUM POLYSTYRENE SULFONATE 15 GM/60ML PO SUSP
15.0000 g | Freq: Once | ORAL | Status: AC
  Administered 2017-05-21: 15 g via ORAL
  Filled 2017-05-21: qty 60

## 2017-05-21 MED ORDER — ALPRAZOLAM 0.5 MG PO TABS
0.5000 mg | ORAL_TABLET | Freq: Two times a day (BID) | ORAL | Status: DC
  Administered 2017-05-21 – 2017-05-22 (×2): 0.5 mg via ORAL
  Filled 2017-05-21 (×2): qty 1

## 2017-05-21 MED ORDER — TRAMADOL HCL 50 MG PO TABS: 50.0000 mg | ORAL_TABLET | Freq: Two times a day (BID) | ORAL | Status: DC | PRN

## 2017-05-21 MED ORDER — PRO-STAT SUGAR FREE PO LIQD
30.0000 mL | Freq: Every day | ORAL | Status: DC
  Administered 2017-05-22: 30 mL via ORAL

## 2017-05-21 MED ORDER — HEPARIN SODIUM (PORCINE) 5000 UNIT/ML IJ SOLN
5000.0000 [IU] | Freq: Three times a day (TID) | INTRAMUSCULAR | Status: DC
  Filled 2017-05-21: qty 1

## 2017-05-21 MED ORDER — ENOXAPARIN SODIUM 30 MG/0.3ML ~~LOC~~ SOLN: 30.0000 mg | SUBCUTANEOUS | Status: DC

## 2017-05-21 MED ORDER — NEPHRO-VITE 0.8 MG PO TABS
1.0000 | ORAL_TABLET | Freq: Every day | ORAL | Status: DC
  Administered 2017-05-21: 1 via ORAL
  Filled 2017-05-21 (×2): qty 1

## 2017-05-21 MED ORDER — SODIUM CHLORIDE 0.9 % IV SOLN: 100.0000 mL | INTRAVENOUS | Status: DC | PRN

## 2017-05-21 MED ORDER — ONDANSETRON HCL 4 MG PO TABS: 4.0000 mg | ORAL_TABLET | Freq: Three times a day (TID) | ORAL | Status: DC | PRN

## 2017-05-21 MED ORDER — FUROSEMIDE 10 MG/ML IJ SOLN
80.0000 mg | Freq: Once | INTRAMUSCULAR | Status: AC
  Administered 2017-05-21: 80 mg via INTRAVENOUS
  Filled 2017-05-21: qty 8

## 2017-05-21 MED ORDER — CALCIUM ACETATE 667 MG PO CAPS
667.0000 mg | ORAL_CAPSULE | Freq: Two times a day (BID) | ORAL | Status: DC
  Administered 2017-05-21 – 2017-05-22 (×2): 667 mg via ORAL
  Filled 2017-05-21 (×4): qty 1

## 2017-05-21 MED ORDER — FLUTICASONE PROPIONATE 50 MCG/ACT NA SUSP
2.0000 | Freq: Every day | NASAL | Status: DC | PRN
Start: 2017-05-21 — End: 2017-05-22
  Filled 2017-05-21: qty 16

## 2017-05-21 MED ORDER — EZETIMIBE 10 MG PO TABS
10.0000 mg | ORAL_TABLET | Freq: Every day | ORAL | Status: DC
  Administered 2017-05-22: 10 mg via ORAL
  Filled 2017-05-21: qty 1

## 2017-05-21 MED ORDER — LIDOCAINE-PRILOCAINE 2.5-2.5 % EX CREA
1.0000 "application " | TOPICAL_CREAM | CUTANEOUS | Status: DC | PRN
  Filled 2017-05-21: qty 5

## 2017-05-21 MED ORDER — ENSURE ENLIVE PO LIQD
237.0000 mL | Freq: Three times a day (TID) | ORAL | Status: DC
  Administered 2017-05-21 – 2017-05-22 (×2): 237 mL via ORAL

## 2017-05-21 MED ORDER — ALTEPLASE 2 MG IJ SOLR: 2.0000 mg | Freq: Once | INTRAMUSCULAR | Status: DC | PRN

## 2017-05-21 MED ORDER — CARVEDILOL 25 MG PO TABS
25.0000 mg | ORAL_TABLET | Freq: Once | ORAL | Status: AC
  Administered 2017-05-21: 25 mg via ORAL
  Filled 2017-05-21: qty 1

## 2017-05-21 MED ORDER — FUROSEMIDE 40 MG PO TABS
40.0000 mg | ORAL_TABLET | ORAL | Status: DC
  Filled 2017-05-21: qty 1

## 2017-05-21 MED ORDER — INSULIN ASPART 100 UNIT/ML ~~LOC~~ SOLN: 0.0000 [IU] | Freq: Every day | SUBCUTANEOUS | Status: DC

## 2017-05-21 MED ORDER — LOSARTAN POTASSIUM 50 MG PO TABS
100.0000 mg | ORAL_TABLET | Freq: Every day | ORAL | Status: DC
  Administered 2017-05-22: 100 mg via ORAL
  Filled 2017-05-21: qty 2

## 2017-05-21 MED ORDER — DEXTROSE 50 % IV SOLN
1.0000 | Freq: Once | INTRAVENOUS | Status: AC
  Administered 2017-05-21: 50 mL via INTRAVENOUS
  Filled 2017-05-21: qty 50

## 2017-05-21 MED ORDER — CARVEDILOL 12.5 MG PO TABS
12.5000 mg | ORAL_TABLET | Freq: Two times a day (BID) | ORAL | Status: DC
  Administered 2017-05-22: 12.5 mg via ORAL
  Filled 2017-05-21: qty 1

## 2017-05-21 MED ORDER — SERTRALINE HCL 50 MG PO TABS
50.0000 mg | ORAL_TABLET | Freq: Every day | ORAL | Status: DC
  Administered 2017-05-22: 50 mg via ORAL
  Filled 2017-05-21: qty 1

## 2017-05-21 MED ORDER — CINACALCET HCL 30 MG PO TABS
60.0000 mg | ORAL_TABLET | Freq: Every day | ORAL | Status: DC
  Administered 2017-05-22: 60 mg via ORAL
  Filled 2017-05-21: qty 2

## 2017-05-21 MED ORDER — HEPARIN SODIUM (PORCINE) 1000 UNIT/ML DIALYSIS
1000.0000 [IU] | INTRAMUSCULAR | Status: DC | PRN
  Filled 2017-05-21: qty 1

## 2017-05-21 MED ORDER — HYDRALAZINE HCL 20 MG/ML IJ SOLN: 10.0000 mg | Freq: Four times a day (QID) | INTRAMUSCULAR | Status: DC | PRN

## 2017-05-21 MED ORDER — ONDANSETRON HCL 4 MG/2ML IJ SOLN: 4.0000 mg | Freq: Four times a day (QID) | INTRAMUSCULAR | Status: DC | PRN

## 2017-05-21 MED ORDER — SENNOSIDES-DOCUSATE SODIUM 8.6-50 MG PO TABS: 1.0000 | ORAL_TABLET | Freq: Every evening | ORAL | Status: DC | PRN

## 2017-05-21 MED ORDER — ALBUTEROL SULFATE (2.5 MG/3ML) 0.083% IN NEBU
15.0000 mg | INHALATION_SOLUTION | Freq: Once | RESPIRATORY_TRACT | Status: AC
  Administered 2017-05-21: 15 mg via RESPIRATORY_TRACT
  Filled 2017-05-21: qty 18

## 2017-05-21 MED ORDER — PANTOPRAZOLE SODIUM 40 MG PO TBEC
40.0000 mg | DELAYED_RELEASE_TABLET | Freq: Two times a day (BID) | ORAL | Status: DC
  Administered 2017-05-21 – 2017-05-22 (×2): 40 mg via ORAL
  Filled 2017-05-21 (×2): qty 1

## 2017-05-21 MED ORDER — LIDOCAINE HCL (PF) 1 % IJ SOLN
5.0000 mL | INTRAMUSCULAR | Status: DC | PRN
  Filled 2017-05-21: qty 5

## 2017-05-21 MED ORDER — ACETAMINOPHEN 650 MG RE SUPP: 650.0000 mg | Freq: Four times a day (QID) | RECTAL | Status: DC | PRN

## 2017-05-21 MED ORDER — ALBUTEROL SULFATE HFA 108 (90 BASE) MCG/ACT IN AERS: 2.0000 | INHALATION_SPRAY | Freq: Four times a day (QID) | RESPIRATORY_TRACT | Status: DC | PRN

## 2017-05-21 MED ORDER — INSULIN ASPART 100 UNIT/ML ~~LOC~~ SOLN: 0.0000 [IU] | Freq: Three times a day (TID) | SUBCUTANEOUS | Status: DC

## 2017-05-21 MED ORDER — ACETAMINOPHEN 325 MG PO TABS: 650.0000 mg | ORAL_TABLET | Freq: Four times a day (QID) | ORAL | Status: DC | PRN

## 2017-05-21 MED ORDER — PENTAFLUOROPROP-TETRAFLUOROETH EX AERO
1.0000 "application " | INHALATION_SPRAY | CUTANEOUS | Status: DC | PRN
  Filled 2017-05-21: qty 30

## 2017-05-21 MED ORDER — ALBUTEROL SULFATE (2.5 MG/3ML) 0.083% IN NEBU
INHALATION_SOLUTION | RESPIRATORY_TRACT | Status: AC
  Administered 2017-05-21: 15 mg via RESPIRATORY_TRACT
  Filled 2017-05-21: qty 18

## 2017-05-21 NOTE — ED Triage Notes (Signed)
Pt did not go to dialysis for last week because "i am tired of being stuck".  Called today to go and they won't take her unless she gets potassium checked. Denies CP/SHOB or other complaints r/t missing.  Only wants lab work and her ears checked as well since here because they have been hurting.

## 2017-05-21 NOTE — ED Notes (Signed)
Discussed pt with dr Mable Paris.

## 2017-05-21 NOTE — Progress Notes (Signed)
Pre hd assessment  

## 2017-05-21 NOTE — ED Notes (Signed)
Pt assisted the bathroom at this time, tolerated well. Pt remains alert and oriented. Will continue to monitor for further patient needs at this time. Explained to patient would be going to dialysis ASAP. Pt states understanding at this time.

## 2017-05-21 NOTE — Progress Notes (Signed)
Post hd assessment unchanged  

## 2017-05-21 NOTE — H&P (Signed)
[place] [facility] [place] NAME: Michele Nash    MR#:  [id number]  DATE OF BIRTH:  09/09/1956  DATE OF ADMISSION:  05/21/2017  PRIMARY CARE PHYSICIAN: [name], MD   REQUESTING/REFERRING PHYSICIAN: Darel Hong, MD  CHIEF COMPLAINT:   Hyperkalemia HISTORY OF PRESENT ILLNESS:  Michele Nash  is a 61 y.o. female with a known history of ESRD on HD ,COPD ,GERD and other medical problems, didn't get her HD 2 times, As she doesn't like getting dialysis through her fistula ,went for HD today, but she was sentover to the ED for hyperkalemia.Patient was given insulin, D50, albuterol nebulizer treatments and Kayexalate. Emergency department has discussed with the on-call nephrology who is taking the patient for dialysis as soon as possible. Patient denies any chest pain or shortness of breath. EKG did not reveal any T-wave changes PAST MEDICAL HISTORY:   Past Medical History:  Diagnosis Date  . Altered mental status   . Anemia   . Apnea, sleep    for sleep study 10/17/15-no cpap yet  . Arthritis   . Bronchitis   . Chronic kidney disease   . COPD (chronic obstructive pulmonary disease) ([place])   . Depression   . Dialysis patient ([place]) 2014  . GERD (gastroesophageal reflux disease)   . GIB (gastrointestinal bleeding) 07/09/2016  . Headache   . Hyperlipidemia   . Hypertension   . Obesity   . Pulmonary edema 09/14/2016  . Renal dialysis device, implant, or graft complication    RIGHT CHEST CATH  . Respiratory failure ([place]) 07/23/2016  . Traumatic hematoma of forehead   . Type II diabetes mellitus ([place]) 09/21/2009    PAST SURGICAL HISTOIRY:   Past Surgical History:  Procedure Laterality Date  . ABDOMINAL HYSTERECTOMY    . COLONOSCOPY  2011  . COLONOSCOPY WITH PROPOFOL N/A 09/20/2015   Procedure: COLONOSCOPY WITH PROPOFOL;  Surgeon: Lucilla Lame, MD;  Location: [facility] ENDOSCOPY;  Service: Endoscopy;  Laterality: N/A;  . DIALYSIS  FISTULA CREATION    . ESOPHAGOGASTRODUODENOSCOPY N/A 07/13/2016   Procedure: ESOPHAGOGASTRODUODENOSCOPY (EGD);  Surgeon: Lollie Sails, MD;  Location: [facility] ENDOSCOPY;  Service: Endoscopy;  Laterality: N/A;  . ESOPHAGOGASTRODUODENOSCOPY (EGD) WITH PROPOFOL N/A 06/02/2016   Procedure: ESOPHAGOGASTRODUODENOSCOPY (EGD) WITH PROPOFOL;  Surgeon: Manya Silvas, MD;  Location: [facility] ENDOSCOPY;  Service: Endoscopy;  Laterality: N/A;  . PERIPHERAL VASCULAR CATHETERIZATION N/A 05/10/2015   Procedure: A/V Shuntogram/Fistulagram;  Surgeon: Katha Cabal, MD;  Location: [place] CV LAB;  Service: Cardiovascular;  Laterality: N/A;  . PERIPHERAL VASCULAR CATHETERIZATION Left 05/10/2015   Procedure: A/V Shunt Intervention;  Surgeon: Katha Cabal, MD;  Location: [place] CV LAB;  Service: Cardiovascular;  Laterality: Left;  . PERIPHERAL VASCULAR CATHETERIZATION Left 08/23/2015   Procedure: A/V Shuntogram/Fistulagram;  Surgeon: Katha Cabal, MD;  Location: [place] CV LAB;  Service: Cardiovascular;  Laterality: Left;  . PERIPHERAL VASCULAR CATHETERIZATION N/A 08/23/2015   Procedure: A/V Shunt Intervention;  Surgeon: Katha Cabal, MD;  Location: [place] CV LAB;  Service: Cardiovascular;  Laterality: N/A;  . PERIPHERAL VASCULAR CATHETERIZATION  08/23/2015   Procedure: Dialysis/Perma Catheter Insertion;  Surgeon: Katha Cabal, MD;  Location: [place] CV LAB;  Service: Cardiovascular;;  . PERIPHERAL VASCULAR CATHETERIZATION N/A 01/03/2016   Procedure: Dialysis/Perma Catheter Removal;  Surgeon: Katha Cabal, MD;  Location: [place] CV LAB;  Service: Cardiovascular;  Laterality: N/A;  . REVISON OF ARTERIOVENOUS FISTULA Left 10/28/2015   Procedure:  REVISON OF ARTERIOVENOUS FISTULA WITH ARTEGRAFT;  Surgeon: Katha Cabal, MD;  Location: [facility] ORS;  Service: Vascular;  Laterality: Left;  . WOUND DEBRIDEMENT Left 10/28/2015   Procedure: Resection of shoulder cyst (  left );  Surgeon: Katha Cabal, MD;  Location: [facility] ORS;  Service: Vascular;  Laterality: Left;    SOCIAL HISTORY:   Social History  Substance Use Topics  . Smoking status: Current Every Day Smoker    Packs/day: 0.50    Years: 40.00    Types: Cigarettes    Last attempt to quit: 10/21/2016  . Smokeless tobacco: Never Used  . Alcohol use No    FAMILY HISTORY:   Family History  Problem Relation Age of Onset  . Heart disease Mother   . Cancer Father   . Cancer Sister     DRUG ALLERGIES:   Allergies  Allergen Reactions  . Chantix [Varenicline]     hallucinations  . Sulfa Antibiotics Itching, Swelling, Rash and Other (See Comments)    Reaction:  Facial/body swelling     REVIEW OF SYSTEMS:  CONSTITUTIONAL: No fever, fatigue or weakness.  EYES: No blurred or double vision.  EARS, NOSE, AND THROAT: No tinnitus or ear pain.  RESPIRATORY: No cough, shortness of breath, wheezing or hemoptysis.  CARDIOVASCULAR: No chest pain, orthopnea, edema.  GASTROINTESTINAL: No nausea, vomiting, diarrhea or abdominal pain.  GENITOURINARY: No dysuria, hematuria.  ENDOCRINE: No polyuria, nocturia,  HEMATOLOGY: No anemia, easy bruising or bleeding SKIN: No rash or lesion. MUSCULOSKELETAL: No joint pain or arthritis.   NEUROLOGIC: No tingling, numbness, weakness.  PSYCHIATRY: No anxiety or depression.   MEDICATIONS AT HOME:   Prior to Admission medications   Medication Sig Start Date End Date Taking? Authorizing Provider  albuterol (PROVENTIL HFA;VENTOLIN HFA) 108 (90 Base) MCG/ACT inhaler Inhale 2 puffs into the lungs every 6 (six) hours as needed for wheezing or shortness of breath. 12/11/16  Yes Yamna Mackel, Illene Silver, MD  ALPRAZolam Duanne Moron) 0.5 MG tablet Take 0.5 mg by mouth 2 (two) times daily.   Yes [provider]  Amino Acids-Protein Hydrolys (FEEDING SUPPLEMENT, PRO-STAT SUGAR FREE 64,) LIQD Take 30 mLs by mouth daily. 12/12/16  Yes Annalisse Minkoff, Illene Silver, MD  amLODipine (NORVASC) 10 MG  tablet Take 10 mg by mouth daily.    Yes [provider]  calcium acetate (PHOSLO) 667 MG capsule Take 667 mg by mouth 2 (two) times daily.    Yes [provider]  carvedilol (COREG) 12.5 MG tablet Take 12.5 mg by mouth 2 (two) times daily with a meal.   Yes [provider]  cinacalcet (SENSIPAR) 30 MG tablet Take 60 mg by mouth daily with breakfast.    Yes [provider]  ezetimibe (ZETIA) 10 MG tablet Take 10 mg by mouth daily.    Yes [provider]  feeding supplement, ENSURE ENLIVE, (ENSURE ENLIVE) LIQD Take 237 mLs by mouth 3 (three) times daily between meals. 10/11/16  Yes [name], MD  fluticasone (FLONASE) 50 MCG/ACT nasal spray Place 2 sprays into both nostrils daily as needed for rhinitis.   Yes [provider]  furosemide (LASIX) 40 MG tablet Take 40 mg by mouth every Tuesday, Thursday, Saturday, and Sunday.    Yes [provider]  ipratropium-albuterol (DUONEB) 0.5-2.5 (3) MG/3ML SOLN Take 3 mLs by nebulization every 6 (six) hours as needed. 12/11/16  Yes Theodoros Stjames, Illene Silver, MD  lidocaine-prilocaine (EMLA) cream Apply 1 application topically as needed (prior to accessing port).   Yes [provider]  losartan (COZAAR) 100 MG tablet Take 100 mg by mouth daily.    Yes [provider]  multivitamin (RENA-VIT) TABS tablet Take 1 tablet by mouth at bedtime.    Yes [provider]  ondansetron (ZOFRAN) 4 MG tablet Take 1 tablet (4 mg total) by mouth every 8 (eight) hours as needed for nausea or vomiting. 03/24/17  Yes Lisa Roca, MD  pantoprazole (PROTONIX) 40 MG tablet Take 40 mg by mouth 2 (two) times daily.   Yes [provider]  sertraline (ZOLOFT) 50 MG tablet Take 50 mg by mouth daily.    Yes [provider]  acetaminophen (TYLENOL) 325 MG tablet Take 2 tablets (650 mg total) by mouth every 6 (six) hours as needed for mild pain (or Fever >/= 101). Patient not taking:  Reported on 05/21/2017 12/11/16   Nicholes Mango, MD  senna-docusate (SENOKOT-S) 8.6-50 MG tablet Take 1 tablet by mouth at bedtime as needed for mild constipation. Patient not taking: Reported on 05/21/2017 12/11/16   Nicholes Mango, MD  traMADol (ULTRAM) 50 MG tablet Take 1 tablet (50 mg total) by mouth every 12 (twelve) hours as needed for moderate pain. Patient not taking: Reported on 05/21/2017 12/11/16   Nicholes Mango, MD      VITAL SIGNS:  Blood pressure (!) 189/98, pulse 73, temperature 97.8 F (36.6 C), temperature source Oral, resp. rate 20, height 5\' 5"  (1.651 m), weight 58.9 kg (129 lb 13.6 oz), SpO2 100 %.  PHYSICAL EXAMINATION:  GENERAL:  61 y.o.-year-old patient lying in the bed with no acute distress.  EYES: Pupils equal, round, reactive to light and accommodation. No scleral icterus. Extraocular muscles intact.  HEENT: Head atraumatic, normocephalic. Oropharynx and nasopharynx clear.  NECK:  Supple, no jugular venous distention. No thyroid enlargement, no tenderness.  LUNGS: Normal breath sounds bilaterally, no wheezing, rales,rhonchi or crepitation. No use of accessory muscles of respiration.  CARDIOVASCULAR: S1, S2 normal. No murmurs, rubs, or gallops.  ABDOMEN: Soft, nontender, nondistended. Bowel sounds present. No organomegaly or mass.  EXTREMITIES: No pedal edema, cyanosis, or clubbing.  NEUROLOGIC: Cranial nerves II through XII are intact. Muscle strength 5/5 in all extremities. Sensation intact. Gait not checked.  PSYCHIATRIC: The patient is alert and oriented x 3.  SKIN: No obvious rash, lesion, or ulcer.   LABORATORY PANEL:   CBC  Recent Labs Lab 05/21/17 1213  WBC 7.1  HGB 11.1*  HCT 33.8*  PLT 233   ------------------------------------------------------------------------------------------------------------------  Chemistries   Recent Labs Lab 05/21/17 1123 05/21/17 1213  NA 137  --   K 6.5*  --   CL 98*  --   CO2 25  --   GLUCOSE 92  --   BUN 96*   --   CREATININE 14.15*  --   CALCIUM 7.3*  --   AST  --  20  ALT  --  14  ALKPHOS  --  193*  BILITOT  --  0.9   ------------------------------------------------------------------------------------------------------------------  Cardiac Enzymes No results for input(s): TROPONINI in the last 168 hours. ------------------------------------------------------------------------------------------------------------------  RADIOLOGY:  Dg Chest Port 1 View  Result Date: 05/21/2017 CLINICAL DATA:  61 year old female with end-stage renal disease on hemodialysis. Needs evaluation prior to undergoing next hemodialysis session. EXAM: PORTABLE CHEST 1 VIEW COMPARISON:  Prior chest x-ray 03/25/2007 FINDINGS: Stable cardiomegaly with left ventricular enlargement. Atherosclerotic calcifications noted in the aorta. Mild pulmonary vascular congestion without overt edema. Probable small right pleural effusion. No focal airspace consolidation or pneumothorax. No acute  osseous abnormality. IMPRESSION: 1. Stable cardiomegaly and pulmonary vascular congestion without overt edema. 2. Small right pleural effusion. 3.  Aortic Atherosclerosis (ICD10-170.0) Electronically Signed   By: Jacqulynn Cadet M.D.   On: 05/21/2017 12:13    EKG:   Orders placed or performed during the hospital encounter of 05/21/17  . ED EKG  . ED EKG  . EKG 12-Lead  . EKG 12-Lead    IMPRESSION AND PLAN:  Michele Nash  is a 61 y.o. female with a known history of ESRD on HD ,COPD ,GERD and other medical problems, didn't get her HD 2 times, went for HD today, but she was sentover to the ED for hyperkalemia.Patient was given insulin, D50, albuterol nebulizer treatments and Kayexalate. Emergency department has discussed with the on-call nephrology   # Acute hyperkalemia admit to MedSurg unit after urgent dialysis Patient has received D50, insulin, albuterol neb laser treatments and Kayexalate For hemodialysis today as soon as possible  discussed with Dr. Holley Raring Repeat BMP after dialysis Hyperkalemia is secondary to noncompliance with dialysis as she doesn't like getting hemodialysis through AV fistula, she prefers getting permanent hemo-dialysis catheter, consider vascular surgery consult  #End stage renal disease continue hemodialysis Follow-up with nephrology  #Essential hypertension Continue home medications amlodipine, Coreg and Lasix   #COPD no exacerbation Provide nebulizer treatments DuoNeb's and albuterol as needed  #Tobacco abuse disorder Counseled patient to quit smoking for 5-6 minutes. Patient verbalized understanding. We will start her on nicotine patch 7 mg     All the records are reviewed and case discussed with ED provider. Management plans discussed with the patient, family and they are in agreement.  CODE STATUS: fc , husband HCPOA   TOTAL TIME TAKING CARE OF THIS PATIENT: 43  minutes.   Note: This dictation was prepared with Dragon dictation along with smaller phrase technology. Any transcriptional errors that result from this process are unintentional.  Nicholes Mango M.D on 05/21/2017 at 5:29 PM  Between 7am to 6pm - Pager - [phone]  After 6pm go to [email or web address] - password [email or web address] [facility]  Office  [phone]  CC: Primary care physician; [name], MD

## 2017-05-21 NOTE — Progress Notes (Signed)
Hemodialysis- Report received from ED. ED to transport patient to HD, will transport to floor post dialysis.

## 2017-05-21 NOTE — Progress Notes (Signed)
Having some lightheadedness, not feeling well.

## 2017-05-21 NOTE — ED Provider Notes (Signed)
[facility] Emergency Department Provider Note  ____________________________________________   First MD Initiated Contact with Patient 05/21/17 1156     (approximate)  I have reviewed the triage vital signs and the nursing notes.   HISTORY  Chief Complaint wants potassium checked  HPI Michele Nash is a 61 y.o. female who comes to the emergency department requesting that her potassium rechecked. She has a past medical history of end-stage renal disease for which she receives hemodialysis via a fistula in her left upper extremity Tuesday through Saturday. She last had dialysis one week ago. She did not go to dialysis the last 2 times because she does not like her fistula does not like needles. She is frustrated because she wants a permacath because he does not require being poked. She does report somewhat increasing shortness of breath. She attempted to go to dialysis today however they've refused her stating that she had to come to the emergency department instead.   Past Medical History:  Diagnosis Date  . Altered mental status   . Anemia   . Apnea, sleep    for sleep study 10/17/15-no cpap yet  . Arthritis   . Bronchitis   . Chronic kidney disease   . COPD (chronic obstructive pulmonary disease) ([place])   . Depression   . Dialysis patient ([place]) 2014  . GERD (gastroesophageal reflux disease)   . GIB (gastrointestinal bleeding) 07/09/2016  . Headache   . Hyperlipidemia   . Hypertension   . Obesity   . Pulmonary edema 09/14/2016  . Renal dialysis device, implant, or graft complication    RIGHT CHEST CATH  . Respiratory failure ([place]) 07/23/2016  . Traumatic hematoma of forehead   . Type II diabetes mellitus ([place]) 09/21/2009    Patient Active Problem List   Diagnosis Date Noted  . Respiratory failure, acute and chronic ([place]) 12/04/2016  . Multiple closed fractures of ribs of right side   . Hyperlipidemia 11/28/2016  . COPD exacerbation ([place])  10/29/2016  . Altered mental status   . Fall   . Traumatic hematoma of forehead   . Acute respiratory failure with hypercapnia ([place]) 10/06/2016  . Pulmonary edema 09/14/2016  . Respiratory failure ([place]) 07/23/2016  . GIB (gastrointestinal bleeding) 07/09/2016  . Protein-calorie malnutrition, severe 05/29/2016  . Hyperglycemia 05/28/2016  . Pain in limb 10/28/2015  . ESRD on dialysis ([place]) 10/28/2015  . Essential hypertension 10/28/2015  . ESRD on hemodialysis ([place]) 03/29/2015  . HTN (hypertension) 03/29/2015  . COPD (chronic obstructive pulmonary disease) ([place]) 03/29/2015  . GAD (generalized anxiety disorder) 03/29/2015  . Chronic kidney disease 10/18/2009  . Personal history of tobacco use, presenting hazards to health 10/18/2009  . Type II diabetes mellitus ([place]) 09/21/2009  . Disorder of uterus 02/14/1990    Past Surgical History:  Procedure Laterality Date  . ABDOMINAL HYSTERECTOMY    . COLONOSCOPY  2011  . COLONOSCOPY WITH PROPOFOL N/A 09/20/2015   Procedure: COLONOSCOPY WITH PROPOFOL;  Surgeon: Lucilla Lame, MD;  Location: [facility] ENDOSCOPY;  Service: Endoscopy;  Laterality: N/A;  . DIALYSIS FISTULA CREATION    . ESOPHAGOGASTRODUODENOSCOPY N/A 07/13/2016   Procedure: ESOPHAGOGASTRODUODENOSCOPY (EGD);  Surgeon: Lollie Sails, MD;  Location: [facility] ENDOSCOPY;  Service: Endoscopy;  Laterality: N/A;  . ESOPHAGOGASTRODUODENOSCOPY (EGD) WITH PROPOFOL N/A 06/02/2016   Procedure: ESOPHAGOGASTRODUODENOSCOPY (EGD) WITH PROPOFOL;  Surgeon: Manya Silvas, MD;  Location: [facility] ENDOSCOPY;  Service: Endoscopy;  Laterality: N/A;  . PERIPHERAL VASCULAR CATHETERIZATION N/A 05/10/2015   Procedure: A/V Shuntogram/Fistulagram;  Surgeon: Katha Cabal, MD;  Location: [place] CV LAB;  Service: Cardiovascular;  Laterality: N/A;  . PERIPHERAL VASCULAR CATHETERIZATION Left 05/10/2015   Procedure: A/V Shunt Intervention;  Surgeon: Katha Cabal, MD;  Location: [place] CV LAB;  Service:  Cardiovascular;  Laterality: Left;  . PERIPHERAL VASCULAR CATHETERIZATION Left 08/23/2015   Procedure: A/V Shuntogram/Fistulagram;  Surgeon: Katha Cabal, MD;  Location: [place] CV LAB;  Service: Cardiovascular;  Laterality: Left;  . PERIPHERAL VASCULAR CATHETERIZATION N/A 08/23/2015   Procedure: A/V Shunt Intervention;  Surgeon: Katha Cabal, MD;  Location: [place] CV LAB;  Service: Cardiovascular;  Laterality: N/A;  . PERIPHERAL VASCULAR CATHETERIZATION  08/23/2015   Procedure: Dialysis/Perma Catheter Insertion;  Surgeon: Katha Cabal, MD;  Location: [place] CV LAB;  Service: Cardiovascular;;  . PERIPHERAL VASCULAR CATHETERIZATION N/A 01/03/2016   Procedure: Dialysis/Perma Catheter Removal;  Surgeon: Katha Cabal, MD;  Location: [place] CV LAB;  Service: Cardiovascular;  Laterality: N/A;  . REVISON OF ARTERIOVENOUS FISTULA Left 10/28/2015   Procedure: REVISON OF ARTERIOVENOUS FISTULA WITH ARTEGRAFT;  Surgeon: Katha Cabal, MD;  Location: [facility] ORS;  Service: Vascular;  Laterality: Left;  . WOUND DEBRIDEMENT Left 10/28/2015   Procedure: Resection of shoulder cyst ( left );  Surgeon: Katha Cabal, MD;  Location: [facility] ORS;  Service: Vascular;  Laterality: Left;    Prior to Admission medications   Medication Sig Start Date End Date Taking? Authorizing Provider  acetaminophen (TYLENOL) 325 MG tablet Take 2 tablets (650 mg total) by mouth every 6 (six) hours as needed for mild pain (or Fever >/= 101). 12/11/16   Gouru, Illene Silver, MD  albuterol (PROVENTIL HFA;VENTOLIN HFA) 108 (90 Base) MCG/ACT inhaler Inhale 2 puffs into the lungs every 6 (six) hours as needed for wheezing or shortness of breath. 12/11/16   Gouru, Illene Silver, MD  ALPRAZolam Duanne Moron) 0.5 MG tablet Take 0.5 mg by mouth 2 (two) times daily.    [provider]  Amino Acids-Protein Hydrolys (FEEDING SUPPLEMENT, PRO-STAT SUGAR FREE 64,) LIQD Take 30 mLs by mouth daily. 12/12/16   Gouru, Illene Silver, MD    amLODipine (NORVASC) 10 MG tablet Take 10 mg by mouth daily.     [provider]  calcium acetate (PHOSLO) 667 MG capsule Take 1,334 mg by mouth 3 (three) times daily.     [provider]  carvedilol (COREG) 12.5 MG tablet Take 12.5 mg by mouth 2 (two) times daily with a meal.    [provider]  cinacalcet (SENSIPAR) 30 MG tablet Take 60 mg by mouth daily with breakfast.     [provider]  ezetimibe (ZETIA) 10 MG tablet Take 10 mg by mouth daily.     [provider]  feeding supplement, ENSURE ENLIVE, (ENSURE ENLIVE) LIQD Take 237 mLs by mouth 3 (three) times daily between meals. 10/11/16   [name], MD  fluticasone (FLONASE) 50 MCG/ACT nasal spray Place 2 sprays into both nostrils daily as needed for rhinitis.    [provider]  furosemide (LASIX) 40 MG tablet Take 40 mg by mouth every Tuesday, Thursday, Saturday, and Sunday.     [provider]  ipratropium-albuterol (DUONEB) 0.5-2.5 (3) MG/3ML SOLN Take 3 mLs by nebulization every 6 (six) hours as needed. 12/11/16   Nicholes Mango, MD  lidocaine-prilocaine (EMLA) cream Apply 1 application topically as needed (prior to accessing port).    [provider]  losartan (COZAAR) 100 MG tablet Take 100 mg by mouth daily.  [provider]  multivitamin (RENA-VIT) TABS tablet Take 1 tablet by mouth at bedtime.     [provider]  ondansetron (ZOFRAN) 4 MG tablet Take 1 tablet (4 mg total) by mouth every 8 (eight) hours as needed for nausea or vomiting. 03/24/17   Lisa Roca, MD  pantoprazole (PROTONIX) 40 MG tablet Take 40 mg by mouth 2 (two) times daily.    [provider]  senna-docusate (SENOKOT-S) 8.6-50 MG tablet Take 1 tablet by mouth at bedtime as needed for mild constipation. 12/11/16   Nicholes Mango, MD  sertraline (ZOLOFT) 50 MG tablet Take 50 mg by mouth daily.     [provider]  traMADol (ULTRAM) 50 MG tablet Take 1  tablet (50 mg total) by mouth every 12 (twelve) hours as needed for moderate pain. 12/11/16   Nicholes Mango, MD    Allergies Chantix [varenicline] and Sulfa antibiotics  Family History  Problem Relation Age of Onset  . Heart disease Mother   . Cancer Father   . Cancer Sister     Social History Social History  Substance Use Topics  . Smoking status: Current Every Day Smoker    Packs/day: 0.50    Years: 40.00    Types: Cigarettes    Last attempt to quit: 10/21/2016  . Smokeless tobacco: Never Used  . Alcohol use No    Review of Systems Constitutional: No fever/chills Eyes: No visual changes. ENT: No sore throat. Cardiovascular: Denies chest pain. Respiratory: Positive shortness of breath. Gastrointestinal: No abdominal pain.  No nausea, no vomiting.  No diarrhea.  No constipation. Genitourinary: Negative for dysuria. Musculoskeletal: Negative for back pain. Skin: Negative for rash. Neurological: Negative for headaches, focal weakness or numbness.   ____________________________________________   PHYSICAL EXAM:  VITAL SIGNS: ED Triage Vitals [05/21/17 1109]  Enc Vitals Group     BP      Pulse Rate 67     Resp      Temp 98.5 F (36.9 C)     Temp Source Oral     SpO2 100 %     Weight 130 lb (59 kg)     Height 5\' 5"  (1.651 m)     Head Circumference      Peak Flow      Pain Score      Pain Loc      Pain Edu?      Excl. in [place]?     Constitutional: Alert and oriented 4 somewhat flat affect noted diaphoresis using nasal cannula Eyes: PERRL EOMI. Head: Atraumatic. Nose: No congestion/rhinnorhea. Mouth/Throat: No trismus Neck: No stridor.   Cardiovascular: Normal rate, regular rhythm. Grossly normal heart sounds.  Good peripheral circulation. Respiratory: Mild increased respiratory effort with mild Gastrointestinal: Soft nontender Musculoskeletal: Fistula in place left upper extremity with good thrill  Neurologic:  Normal speech and language. No gross focal  neurologic deficits are appreciated. Skin:  Skin is warm, dry and intact. No rash noted. Psychiatric: Mood and affect are normal. Speech and behavior are normal.    ____________________________________________   DIFFERENTIAL includes but not limited to  Hyperkalemia, fluid overload, azotemia ____________________________________________   LABS (all labs ordered are listed, but only abnormal results are displayed)  Labs Reviewed  BASIC METABOLIC PANEL - Abnormal; Notable for the following:       Result Value   Potassium 6.5 (*)    Chloride 98 (*)    BUN 96 (*)    Creatinine, Ser 14.15 (*)    Calcium  7.3 (*)    GFR calc non Af Amer 2 (*)    GFR calc Af Amer 3 (*)    All other components within normal limits    Elevated potassium __________________________________________  EKG  ED ECG REPORT I, Darel Hong, the attending physician, personally viewed and interpreted this ECG.  Date: 05/21/2017 Rate: 65 Rhythm: normal sinus rhythm QRS Axis: normal Intervals: normal ST/T Wave abnormalities: Slightly peaked T waves Narrative Interpretation: Abnormal  ____________________________________________  RADIOLOGY  Chest x-ray with no acute disease with chronic pulmonary congestion ____________________________________________   PROCEDURES  Procedure(s) performed: no  Procedures  Critical Care performed:yes  CRITICAL CARE Performed by: Darel Hong   Total critical care time: 35 minutes  Critical care time was exclusive of separately billable procedures and treating other patients.  Critical care was necessary to treat or prevent imminent or life-threatening deterioration.  Critical care was time spent personally by me on the following activities: development of treatment plan with patient and/or surrogate as well as nursing, discussions with consultants, evaluation of patient's response to treatment, examination of patient, obtaining history from patient or  surrogate, ordering and performing treatments and interventions, ordering and review of laboratory studies, ordering and review of radiographic studies, pulse oximetry and re-evaluation of patient's condition.   Observation: no ____________________________________________   INITIAL IMPRESSION / ASSESSMENT AND PLAN / ED COURSE  Pertinent labs & imaging results that were available during my care of the patient were reviewed by me and considered in my medical decision making (see chart for details).  The patient arrives relatively well-appearing although with elevated potassium and early EKG changes of hyperkalemia. She does make urine so I will treat her with 80 mg of furosemide, 15 mg of albuterol, 5 mg of intravenous insulin, an amp of D50, as well as some Kayexalate. I discussed the case with her nephrologist Dr. Holley Raring who agrees with inpatient admission and emergent dialysis.      ____________________________________________   FINAL CLINICAL IMPRESSION(S) / ED DIAGNOSES  Final diagnoses:  None      NEW MEDICATIONS STARTED DURING THIS VISIT:  New Prescriptions   No medications on file     Note:  This document was prepared using Dragon voice recognition software and may include unintentional dictation errors.     Darel Hong, MD 05/21/17 1216

## 2017-05-21 NOTE — ED Notes (Signed)
Pt escorted to dialysis by this RN and and Kae Heller, Extern. This RN offered to take patient in stretcher, pt refused and wanted to go in wheelchair.

## 2017-05-21 NOTE — Progress Notes (Signed)
HD initiated via L AVF without issue. Patient had no complaints during cannulation. Patient states that "I just dont wanna get stuck no more. I see another patient with a catheter and I just want one of those." Discussed the importance of infection prevention with patient. 2k bath per orders.

## 2017-05-21 NOTE — Progress Notes (Signed)
Order for enoxaparin for DVT ppx was changed to heparin 5000 units subQ TID per protocol as patient has ESRD on HD.  Lenis Noon, PharmD 05/21/17 7:10 PM

## 2017-05-21 NOTE — Progress Notes (Signed)
Pre dialysis  

## 2017-05-21 NOTE — Progress Notes (Signed)
Tolerated treatment well. UF 1.5L as ordered. No complaints. Denies pain. Lungs diminished. No change from previous assessment. Report called to [facility] on Floor. Will transport to room.

## 2017-05-21 NOTE — Progress Notes (Signed)
Subjective:    Patient ID: Michele Nash, female    DOB: 1955/12/21, 61 y.o.   MRN: [id number] Chief Complaint  Patient presents with  . Re-evaluation    6 month HDA   Patient presents for a 6 month hemodialysis access follow-up. The patient underwent a duplex ultrasound of the AV access which was notable for a patent left brachiocephalic fistula with excessive dilatation of the proximal, middle and distal cephalic vein. 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 Left brachial artery distal to anastomosis demonstrates abnormal arterial flow. Patient reports her hemodialysis doppler flow is stable. The patient denies any issues with hemodialysis such as cannulation problems, increased bleeding, decrease in doppler flow or recirculation. The patient also denies any fistula skin breakdown, edema, pallor or ulceration of the arm / hand. The patient presents today very upset with dialysis staff and how they cannulate her access. She states she asks dialysis staff not to stick her in the same place however she states they do anyway. The patient does not want to dialyze through her access anymore patient is asking for a PermCath insertion. Patient states she understands the risks of a long term PermCath. She is refusing any creation of a new access. Patient states she has not gone to dialysis in almost a week because she refuses to be stuck anymore. The patient is coming go to the emergency department after this visit to have her potassium and drawn in order to start dialyzing again. She is complaining of left upper extremity pain with and without dialysis. Patient is on oxygen. Patient denies any fever, nausea or vomiting.   Review of Systems  Constitutional: Negative.   HENT: Negative.   Eyes: Negative.   Respiratory: Negative.   Cardiovascular: Negative.   Gastrointestinal: Negative.   Endocrine: Negative.     Genitourinary: Negative.   Musculoskeletal: Negative.   Skin: Negative.   Allergic/Immunologic: Negative.   Neurological: Negative.   Hematological: Negative.   Psychiatric/Behavioral: Negative.       Objective:   Physical Exam  Constitutional: She is oriented to person, place, and time. She appears well-developed and well-nourished. No distress.  On oxygen  HENT:  Head: Normocephalic and atraumatic.  Eyes: Conjunctivae are normal. Pupils are equal, round, and reactive to light.  Neck: Normal range of motion.  Cardiovascular: Normal rate, regular rhythm, normal heart sounds and intact distal pulses.   Pulses:      Radial pulses are 2+ on the right side, and 2+ on the left side.  Pulmonary/Chest: Effort normal.  Musculoskeletal: Normal range of motion. She exhibits edema (Mild lower extremity edema noted bilaterally).  Neurological: She is alert and oriented to person, place, and time.  Skin: Skin is warm and dry. She is not diaphoretic.     Left upper extremity access: Aneurysmal throughout the length of the access, skin threatening noted mid fistula area very pulsatile. Bruit and thrill noted  Psychiatric: She has a normal mood and affect. Her behavior is normal. Judgment and thought content normal.  Vitals reviewed.   BP (!) 189/90 (BP Location: Right Arm)   Pulse 68   Resp 18   Ht 5' 4.5" (1.638 m)   Wt 137 lb (62.1 kg)   BMI 23.15 kg/m   Past Medical History:  Diagnosis Date  . Altered mental status   . Anemia   . Apnea, sleep    for sleep study 10/17/15-no  cpap yet  . Arthritis   . Bronchitis   . Chronic kidney disease   . COPD (chronic obstructive pulmonary disease) ([place])   . Depression   . Dialysis patient ([place]) 2014  . GERD (gastroesophageal reflux disease)   . GIB (gastrointestinal bleeding) 07/09/2016  . Headache   . Hyperlipidemia   . Hypertension   . Obesity   . Pulmonary edema 09/14/2016  . Renal dialysis device, implant, or graft complication     RIGHT CHEST CATH  . Respiratory failure ([place]) 07/23/2016  . Traumatic hematoma of forehead   . Type II diabetes mellitus ([place]) 09/21/2009    Social History   Social History  . Marital status: Married    Spouse name: N/A  . Number of children: N/A  . Years of education: N/A   Occupational History  . Not on file.   Social History Main Topics  . Smoking status: Current Every Day Smoker    Packs/day: 0.50    Years: 40.00    Types: Cigarettes    Last attempt to quit: 10/21/2016  . Smokeless tobacco: Never Used  . Alcohol use No  . Drug use: No  . Sexual activity: Not on file   Other Topics Concern  . Not on file   Social History Narrative  . No narrative on file    Past Surgical History:  Procedure Laterality Date  . ABDOMINAL HYSTERECTOMY    . COLONOSCOPY  2011  . COLONOSCOPY WITH PROPOFOL N/A 09/20/2015   Procedure: COLONOSCOPY WITH PROPOFOL;  Surgeon: Lucilla Lame, MD;  Location: [facility] ENDOSCOPY;  Service: Endoscopy;  Laterality: N/A;  . DIALYSIS FISTULA CREATION    . ESOPHAGOGASTRODUODENOSCOPY N/A 07/13/2016   Procedure: ESOPHAGOGASTRODUODENOSCOPY (EGD);  Surgeon: Lollie Sails, MD;  Location: [facility] ENDOSCOPY;  Service: Endoscopy;  Laterality: N/A;  . ESOPHAGOGASTRODUODENOSCOPY (EGD) WITH PROPOFOL N/A 06/02/2016   Procedure: ESOPHAGOGASTRODUODENOSCOPY (EGD) WITH PROPOFOL;  Surgeon: Manya Silvas, MD;  Location: [facility] ENDOSCOPY;  Service: Endoscopy;  Laterality: N/A;  . PERIPHERAL VASCULAR CATHETERIZATION N/A 05/10/2015   Procedure: A/V Shuntogram/Fistulagram;  Surgeon: Katha Cabal, MD;  Location: [place] CV LAB;  Service: Cardiovascular;  Laterality: N/A;  . PERIPHERAL VASCULAR CATHETERIZATION Left 05/10/2015   Procedure: A/V Shunt Intervention;  Surgeon: Katha Cabal, MD;  Location: [place] CV LAB;  Service: Cardiovascular;  Laterality: Left;  . PERIPHERAL VASCULAR CATHETERIZATION Left 08/23/2015   Procedure: A/V Shuntogram/Fistulagram;   Surgeon: Katha Cabal, MD;  Location: [place] CV LAB;  Service: Cardiovascular;  Laterality: Left;  . PERIPHERAL VASCULAR CATHETERIZATION N/A 08/23/2015   Procedure: A/V Shunt Intervention;  Surgeon: Katha Cabal, MD;  Location: [place] CV LAB;  Service: Cardiovascular;  Laterality: N/A;  . PERIPHERAL VASCULAR CATHETERIZATION  08/23/2015   Procedure: Dialysis/Perma Catheter Insertion;  Surgeon: Katha Cabal, MD;  Location: [place] CV LAB;  Service: Cardiovascular;;  . PERIPHERAL VASCULAR CATHETERIZATION N/A 01/03/2016   Procedure: Dialysis/Perma Catheter Removal;  Surgeon: Katha Cabal, MD;  Location: [place] CV LAB;  Service: Cardiovascular;  Laterality: N/A;  . REVISON OF ARTERIOVENOUS FISTULA Left 10/28/2015   Procedure: REVISON OF ARTERIOVENOUS FISTULA WITH ARTEGRAFT;  Surgeon: Katha Cabal, MD;  Location: [facility] ORS;  Service: Vascular;  Laterality: Left;  . WOUND DEBRIDEMENT Left 10/28/2015   Procedure: Resection of shoulder cyst ( left );  Surgeon: Katha Cabal, MD;  Location: [facility] ORS;  Service: Vascular;  Laterality: Left;    Family History  Problem Relation Age of Onset  .  Heart disease Mother   . Cancer Father   . Cancer Sister     Allergies  Allergen Reactions  . Chantix [Varenicline]     hallucinations  . Sulfa Antibiotics Itching, Swelling, Rash and Other (See Comments)    Reaction:  Facial/body swelling        Assessment & Plan:  Patient presents for a 6 month hemodialysis access follow-up. The patient underwent a duplex ultrasound of the AV access which was notable for a patent left brachiocephalic fistula with excessive dilatation of the proximal, middle and distal cephalic vein. Aneurysmal segment of the proximal left upper arm cephalic vein measuring 2.50 cm x 2.48 cm with nonocclusive intraluminal thrombus. Left brachial artery distal to anastomosis demonstrates abnormal arterial flow. Patient reports her hemodialysis  doppler flow is stable. The patient denies any issues with hemodialysis such as cannulation problems, increased bleeding, decrease in doppler flow or recirculation. The patient also denies any fistula skin breakdown, edema, pallor or ulceration of the arm / hand. The patient presents today very upset with dialysis staff and how they cannulate her access. She states she asks dialysis staff not to stick her in the same place however she states they do anyway. The patient does not want to dialyze through her access anymore patient is asking for a PermCath insertion. Patient states she understands the risks of a long term PermCath. She is refusing any creation of a new access. Patient states she has not gone to dialysis in almost a week because she refuses to be stuck anymore. The patient is coming go to the emergency department after this visit to have her potassium and drawn in order to start dialyzing again. She is complaining of left upper extremity pain with and without dialysis. Patient is on oxygen. Patient denies any fever, nausea or vomiting.  1. ESRD on hemodialysis ([place]) - stable Studies reviewed with patient. The patient presents with a progressively aneurysmal access Patient now has skin threatening along with pain with and without access The patient is refusing creation of a new access Patient is asking for PermCath. The patient understands the risk of using a PermCath for long-term. She understands that she is at risk for bleeding, infection and possible death. She assured me that she understands the risks however she does not want creation of a new access and would like a PermCath placed for long-term. Patient is going to go to the ED after our visit, and have her potassium drawn in order to dialyze today. We discussed ligation of her access site to prevent any infection/bleeding. Spoke with Dr. Delana Meyer, we will place a PermCath for long-term dialysis access. The patient expressed their  understanding.  2. Essential hypertension - stable Encouraged good control as its slows the progression of atherosclerotic disease  3. Type 2 diabetes mellitus with complication, unspecified whether long term insulin use (HCC) - stable Encouraged good control as its slows the progression of atherosclerotic disease  4. Mixed hyperlipidemia - stable Encouraged good control as its slows the progression of atherosclerotic disease  Current Outpatient Prescriptions on File Prior to Visit  Medication Sig Dispense Refill  . acetaminophen (TYLENOL) 325 MG tablet Take 2 tablets (650 mg total) by mouth every 6 (six) hours as needed for mild pain (or Fever >/= 101).    Marland Kitchen albuterol (PROVENTIL HFA;VENTOLIN HFA) 108 (90 Base) MCG/ACT inhaler Inhale 2 puffs into the lungs every 6 (six) hours as needed for wheezing or shortness of breath. 1 Inhaler 1  . ALPRAZolam (  XANAX) 0.5 MG tablet Take 0.5 mg by mouth 2 (two) times daily.    . Amino Acids-Protein Hydrolys (FEEDING SUPPLEMENT, PRO-STAT SUGAR FREE 64,) LIQD Take 30 mLs by mouth daily. 900 mL 0  . amLODipine (NORVASC) 10 MG tablet Take 10 mg by mouth daily.     . calcium acetate (PHOSLO) 667 MG capsule Take 1,334 mg by mouth 3 (three) times daily.     . carvedilol (COREG) 12.5 MG tablet Take 12.5 mg by mouth 2 (two) times daily with a meal.    . cinacalcet (SENSIPAR) 30 MG tablet Take 60 mg by mouth daily with breakfast.     . ezetimibe (ZETIA) 10 MG tablet Take 10 mg by mouth daily.     . feeding supplement, ENSURE ENLIVE, (ENSURE ENLIVE) LIQD Take 237 mLs by mouth 3 (three) times daily between meals. 237 mL 12  . fluticasone (FLONASE) 50 MCG/ACT nasal spray Place 2 sprays into both nostrils daily as needed for rhinitis.    . furosemide (LASIX) 40 MG tablet Take 40 mg by mouth every Tuesday, Thursday, Saturday, and Sunday.     Marland Kitchen ipratropium-albuterol (DUONEB) 0.5-2.5 (3) MG/3ML SOLN Take 3 mLs by nebulization every 6 (six) hours as needed. 360 mL 0  .  lidocaine-prilocaine (EMLA) cream Apply 1 application topically as needed (prior to accessing port).    . losartan (COZAAR) 100 MG tablet Take 100 mg by mouth daily.     . multivitamin (RENA-VIT) TABS tablet Take 1 tablet by mouth at bedtime.     . ondansetron (ZOFRAN) 4 MG tablet Take 1 tablet (4 mg total) by mouth every 8 (eight) hours as needed for nausea or vomiting. 10 tablet 0  . pantoprazole (PROTONIX) 40 MG tablet Take 40 mg by mouth 2 (two) times daily.    Marland Kitchen senna-docusate (SENOKOT-S) 8.6-50 MG tablet Take 1 tablet by mouth at bedtime as needed for mild constipation.    . sertraline (ZOLOFT) 50 MG tablet Take 50 mg by mouth daily.     . traMADol (ULTRAM) 50 MG tablet Take 1 tablet (50 mg total) by mouth every 12 (twelve) hours as needed for moderate pain. 25 tablet 0   No current facility-administered medications on file prior to visit.     There are no Patient Instructions on file for this visit. No Follow-up on file.   Chloee Tena A Nealy Karapetian, PA-C

## 2017-05-21 NOTE — Progress Notes (Signed)
[facility]  ROUNDING NOTE   Subjective:  Patient well known to [place] from outpatient hemodialysis. She has missed her last 2 dialysis treatments as an outpatient. She presented to the outpatient unit today but was sent here as it was unknown what her serum potassium was. Serum potassium here was found to be 6.5. Patient states that she has been not going to hemodialysis given pain in her access.   Objective:  Vital signs in last 24 hours:  Temp:  [98.5 F (36.9 C)] 98.5 F (36.9 C) (06/26 1109) Pulse Rate:  [67-68] 67 (06/26 1109) Resp:  [18] 18 (06/26 1035) BP: (189)/(90) 189/90 (06/26 1035) SpO2:  [100 %] 100 % (06/26 1109) Weight:  [59 kg (130 lb)-62.1 kg (137 lb)] 59 kg (130 lb) (06/26 1109)  Weight change:  Filed Weights   05/21/17 1109  Weight: 59 kg (130 lb)    Intake/Output: No intake/output data recorded.   Intake/Output this shift:  No intake/output data recorded.  Physical Exam: General: No acute distress  Head: Normocephalic, atraumatic. Moist oral mucosal membranes  Eyes: Anicteric  Neck: Supple, trachea midline  Lungs:  Clear to auscultation, normal effort  Heart: S1S2 no rubs  Abdomen:  Soft, nontender, bowel sounds present  Extremities:  peripheral edema.  Neurologic: Awake, alert, following commands  Skin: No lesions  Access: Aneurysmal left upper extremity AV fistula     Basic Metabolic Panel:  Recent Labs Lab 05/21/17 1123  NA 137  K 6.5*  CL 98*  CO2 25  GLUCOSE 92  BUN 96*  CREATININE 14.15*  CALCIUM 7.3*    Liver Function Tests: No results for input(s): AST, ALT, ALKPHOS, BILITOT, PROT, ALBUMIN in the last 168 hours. No results for input(s): LIPASE, AMYLASE in the last 168 hours. No results for input(s): AMMONIA in the last 168 hours.  CBC: No results for input(s): WBC, NEUTROABS, HGB, HCT, MCV, PLT in the last 168 hours.  Cardiac Enzymes: No results for input(s): CKTOTAL, CKMB, CKMBINDEX, TROPONINI in the last  168 hours.  BNP: Invalid input(s): POCBNP  CBG: No results for input(s): GLUCAP in the last 168 hours.  Microbiology: Results for orders placed or performed during the hospital encounter of 12/04/16  Culture, blood (Routine x 2)     Status: None   Collection Time: 12/04/16  3:11 AM  Result Value Ref Range Status   Specimen Description BLOOD R WRIST  Final   Special Requests   Final    BOTTLES DRAWN AEROBIC AND ANAEROBIC AER 5ML ANA 2ML   Culture NO GROWTH 5 DAYS  Final   Report Status 12/09/2016 FINAL  Final  Culture, blood (Routine x 2)     Status: None   Collection Time: 12/04/16  3:37 AM  Result Value Ref Range Status   Specimen Description BLOOD R FA  Final   Special Requests   Final    BOTTLES DRAWN AEROBIC AND ANAEROBIC AER 8ML ANA 6ML   Culture NO GROWTH 5 DAYS  Final   Report Status 12/09/2016 FINAL  Final  Urine culture     Status: None   Collection Time: 12/04/16  4:46 AM  Result Value Ref Range Status   Specimen Description URINE, RANDOM  Final   Special Requests NONE  Final   Culture NO GROWTH Performed at [facility]   Final   Report Status 12/05/2016 FINAL  Final    Coagulation Studies: No results for input(s): LABPROT, INR in the last 72 hours.  Urinalysis:  No results for input(s): COLORURINE, LABSPEC, [place], GLUCOSEU, HGBUR, BILIRUBINUR, KETONESUR, PROTEINUR, UROBILINOGEN, NITRITE, LEUKOCYTESUR in the last 72 hours.  Invalid input(s): APPERANCEUR    Imaging: Dg Chest Port 1 View  Result Date: 05/21/2017 CLINICAL DATA:  61 year old female with end-stage renal disease on hemodialysis. Needs evaluation prior to undergoing next hemodialysis session. EXAM: PORTABLE CHEST 1 VIEW COMPARISON:  Prior chest x-ray 03/25/2007 FINDINGS: Stable cardiomegaly with left ventricular enlargement. Atherosclerotic calcifications noted in the aorta. Mild pulmonary vascular congestion without overt edema. Probable small right pleural effusion. No focal airspace  consolidation or pneumothorax. No acute osseous abnormality. IMPRESSION: 1. Stable cardiomegaly and pulmonary vascular congestion without overt edema. 2. Small right pleural effusion. 3.  Aortic Atherosclerosis (ICD10-170.0) Electronically Signed   By: Jacqulynn Cadet M.D.   On: 05/21/2017 12:13     Medications:    . albuterol  15 mg Nebulization Once  . albuterol      . dextrose  1 ampule Intravenous Once  . furosemide  80 mg Intravenous Once  . insulin aspart  5 Units Intravenous Once  . sodium polystyrene  15 g Oral Once     Assessment/ Plan:  61 y.o. female with hypertension, hyperlipidemia, diastolic heart failure, hypertension, COPD, tobacco abuse, anemia, SHPTH, cocaine abuse, admission for hyperkalemia 05/21/17  [place]. /TTHS/ left armAVF  1. ESRD on HD TTHS/painful cannulation of dialysis access:  Patient has missed her last 2 outpatient dialysis treatments. She went for treatment today but was told that she needed to come here to have her serum potassium checked. Serum potassium was quite high at 6.5. We will proceed with hemodialysis today. Ultrafiltration target 1.5 kg.  Patient has been having painful cannulation of her dialysis access. We will obtain vascular surgery consultation for this as well.  2. Hyperkalemia. Serum potassium high at 6.5.  We will dialyze the patient against a 2K bath today.  3. Anemia of chronic kidney disease. Recommend checking CBC to determine her hemoglobin now. Hold off on Epogen for now.  4. Secondary hyperparathyroidism:  Check intact PTH and phosphorus with dialysis treatment today. Further plan once these labs are available.   LOS: 0 Elenora Hawbaker 6/26/201812:18 PM

## 2017-05-22 DIAGNOSIS — E875 Hyperkalemia: Secondary | ICD-10-CM | POA: Diagnosis not present

## 2017-05-22 LAB — COMPREHENSIVE METABOLIC PANEL
ALBUMIN: 3.4 g/dL — AB (ref 3.5–5.0)
ALK PHOS: 171 U/L — AB (ref 38–126)
ALT: 15 U/L (ref 14–54)
AST: 21 U/L (ref 15–41)
Anion gap: 9 (ref 5–15)
BILIRUBIN TOTAL: 0.6 mg/dL (ref 0.3–1.2)
BUN: 53 mg/dL — AB (ref 6–20)
CALCIUM: 7.1 mg/dL — AB (ref 8.9–10.3)
CO2: 33 mmol/L — ABNORMAL HIGH (ref 22–32)
Chloride: 100 mmol/L — ABNORMAL LOW (ref 101–111)
Creatinine, Ser: 8.95 mg/dL — ABNORMAL HIGH (ref 0.44–1.00)
GFR calc Af Amer: 5 mL/min — ABNORMAL LOW (ref >60)
GFR calc non Af Amer: 4 mL/min — ABNORMAL LOW (ref >60)
GLUCOSE: 83 mg/dL (ref 65–99)
Potassium: 4.6 mmol/L (ref 3.5–5.1)
Sodium: 142 mmol/L (ref 135–145)
TOTAL PROTEIN: 6.7 g/dL (ref 6.5–8.1)

## 2017-05-22 LAB — GLUCOSE, CAPILLARY
GLUCOSE-CAPILLARY: 98 mg/dL (ref 65–99)
GLUCOSE-CAPILLARY: 96 mg/dL (ref 65–99)

## 2017-05-22 LAB — CBC
HEMATOCRIT: 29.7 % — AB (ref 35.0–47.0)
Hemoglobin: 9.7 g/dL — ABNORMAL LOW (ref 12.0–16.0)
MCH: 32.7 pg (ref 26.0–34.0)
MCHC: 32.7 g/dL (ref 32.0–36.0)
MCV: 100.1 fL — ABNORMAL HIGH (ref 80.0–100.0)
Platelets: 198 10*3/uL (ref 150–440)
RBC: 2.97 MIL/uL — ABNORMAL LOW (ref 3.80–5.20)
RDW: 14.6 % — AB (ref 11.5–14.5)
WBC: 6.4 10*3/uL (ref 3.6–11.0)

## 2017-05-22 LAB — HIV ANTIBODY (ROUTINE TESTING W REFLEX): HIV Screen 4th Generation wRfx: NONREACTIVE

## 2017-05-22 LAB — PARATHYROID HORMONE, INTACT (NO CA): PTH: 636 pg/mL — ABNORMAL HIGH (ref 15–65)

## 2017-05-22 NOTE — Progress Notes (Signed)
[facility]  ROUNDING NOTE   Subjective:  Patient seen at bedside. She tolerated dialysis quite well yesterday. She states that she did not have pain with cannulation yesterday.   Objective:  Vital signs in last 24 hours:  Temp:  [97.8 F (36.6 C)-98.9 F (37.2 C)] 98.4 F (36.9 C) (06/27 0357) Pulse Rate:  [65-73] 72 (06/27 0448) Resp:  [16-23] 18 (06/27 0357) BP: (154-214)/(67-109) 183/78 (06/27 0448) SpO2:  [97 %-100 %] 100 % (06/27 0724) Weight:  [58.1 kg (128 lb 1.4 oz)-60.1 kg (132 lb 8 oz)] 60.1 kg (132 lb 8 oz) (06/26 1757)  Weight change:  Filed Weights   05/21/17 1408 05/21/17 1715 05/21/17 1757  Weight: 58.9 kg (129 lb 13.6 oz) 58.1 kg (128 lb 1.4 oz) 60.1 kg (132 lb 8 oz)    Intake/Output: I/O last 3 completed shifts: In: -  Out: 1500 [Other:1500]   Intake/Output this shift:  Total I/O In: 240 [P.O.:240] Out: -   Physical Exam: General: No acute distress  Head: Normocephalic, atraumatic. Moist oral mucosal membranes  Eyes: Anicteric  Neck: Supple, trachea midline  Lungs:  Clear to auscultation, normal effort  Heart: S1S2 no rubs  Abdomen:  Soft, nontender, bowel sounds present  Extremities:  peripheral edema.  Neurologic: Awake, alert, following commands  Skin: No lesions  Access: Aneurysmal left upper extremity AV fistula     Basic Metabolic Panel:  Recent Labs Lab 05/21/17 1123 05/21/17 1802 05/22/17 0429  NA 137 139 142  K 6.5* 3.9 4.6  CL 98* 97* 100*  CO2 25 31 33*  GLUCOSE 92 78 83  BUN 96* 41* 53*  CREATININE 14.15* 7.38* 8.95*  CALCIUM 7.3* 7.7* 7.1*  PHOS  --  3.8  --     Liver Function Tests:  Recent Labs Lab 05/21/17 1213 05/22/17 0429  AST 20 21  ALT 14 15  ALKPHOS 193* 171*  BILITOT 0.9 0.6  PROT 7.3 6.7  ALBUMIN 3.8 3.4*   No results for input(s): LIPASE, AMYLASE in the last 168 hours. No results for input(s): AMMONIA in the last 168 hours.  CBC:  Recent Labs Lab 05/21/17 1213  05/22/17 0429  WBC 7.1 6.4  NEUTROABS 5.3  --   HGB 11.1* 9.7*  HCT 33.8* 29.7*  MCV 101.2* 100.1*  PLT 233 198    Cardiac Enzymes: No results for input(s): CKTOTAL, CKMB, CKMBINDEX, TROPONINI in the last 168 hours.  BNP: Invalid input(s): POCBNP  CBG:  Recent Labs Lab 05/21/17 1225 05/21/17 1831 05/21/17 2041 05/22/17 0738  GLUCAP 82 65 164* 98    Microbiology: Results for orders placed or performed during the hospital encounter of 05/21/17  MRSA PCR Screening     Status: None   Collection Time: 05/21/17  6:41 PM  Result Value Ref Range Status   MRSA by PCR NEGATIVE NEGATIVE Final    Comment:        The GeneXpert MRSA Assay (FDA approved for NASAL specimens only), is one component of a comprehensive MRSA colonization surveillance program. It is not intended to diagnose MRSA infection nor to guide or monitor treatment for MRSA infections.     Coagulation Studies: No results for input(s): LABPROT, INR in the last 72 hours.  Urinalysis: No results for input(s): COLORURINE, LABSPEC, PHURINE, GLUCOSEU, HGBUR, BILIRUBINUR, KETONESUR, PROTEINUR, UROBILINOGEN, NITRITE, LEUKOCYTESUR in the last 72 hours.  Invalid input(s): APPERANCEUR    Imaging: Dg Chest Port 1 View  Result Date: 05/21/2017 CLINICAL DATA:  61 year old female with  end-stage renal disease on hemodialysis. Needs evaluation prior to undergoing next hemodialysis session. EXAM: PORTABLE CHEST 1 VIEW COMPARISON:  Prior chest x-ray 03/25/2007 FINDINGS: Stable cardiomegaly with left ventricular enlargement. Atherosclerotic calcifications noted in the aorta. Mild pulmonary vascular congestion without overt edema. Probable small right pleural effusion. No focal airspace consolidation or pneumothorax. No acute osseous abnormality. IMPRESSION: 1. Stable cardiomegaly and pulmonary vascular congestion without overt edema. 2. Small right pleural effusion. 3.  Aortic Atherosclerosis (ICD10-170.0) Electronically  Signed   By: Jacqulynn Cadet M.D.   On: 05/21/2017 12:13     Medications:   . sodium chloride    . sodium chloride     . ALPRAZolam  0.5 mg Oral BID  . amLODipine  10 mg Oral Daily  . b complex-vitamin c-folic acid  1 tablet Oral QHS  . calcium acetate  667 mg Oral BID  . carvedilol  12.5 mg Oral BID WC  . cinacalcet  60 mg Oral Q breakfast  . docusate sodium  100 mg Oral BID  . ezetimibe  10 mg Oral Daily  . feeding supplement (ENSURE ENLIVE)  237 mL Oral TID BM  . feeding supplement (PRO-STAT SUGAR FREE 64)  30 mL Oral Daily  . furosemide  40 mg Oral Q T,Th,S,Su  . heparin subcutaneous  5,000 Units Subcutaneous Q8H  . insulin aspart  0-5 Units Subcutaneous QHS  . insulin aspart  0-9 Units Subcutaneous TID WC  . ipratropium-albuterol  3 mL Nebulization Q6H  . losartan  100 mg Oral Daily  . pantoprazole  40 mg Oral BID  . sertraline  50 mg Oral Daily     Assessment/ Plan:  61 y.o. female with hypertension, hyperlipidemia, diastolic heart failure, hypertension, COPD, tobacco abuse, anemia, SHPTH, cocaine abuse, admission for hyperkalemia 05/21/17  [place]. /TTHS/ left armAVF  1. ESRD on HD TTHS/painful cannulation of dialysis access:  Patient has missed her last 2 outpatient dialysis treatments.  - Patient had hemodialysis yesterday. No significant pain with cannulation of her access. We have asked basilar surgery to evaluate the patient however.  2. Hyperkalemia. Potassium currently 4.6. Improve with dialysis. Continue to monitor.  3. Anemia of chronic kidney disease. Restart Epogen with dialysis tomorrow.  4. Secondary hyperparathyroidism:  Phosphorus 3.8 and acceptable.  Continue calcium acetate and Sensipar.   LOS: 1 Michele Nash 6/27/201811:30 AM

## 2017-05-22 NOTE — Progress Notes (Signed)
Inpatient Diabetes Program Recommendations  AACE/ADA: New Consensus Statement on Inpatient Glycemic Control (2015)  Target Ranges:  Prepandial:   less than 140 mg/dL      Peak postprandial:   less than 180 mg/dL (1-2 hours)      Critically ill patients:  140 - 180 mg/dL  Results for Michele Nash, Michele Nash (MRN [id number]) as of 05/22/2017 11:49  Ref. Range 05/21/2017 12:25 05/21/2017 18:31 05/21/2017 20:41 05/22/2017 07:38 05/22/2017 11:28  Glucose-Capillary Latest Ref Range: 65 - 99 mg/dL 82 65 164 (H) 98 96   Results for Michele Nash, Michele Nash (MRN [id number]) as of 05/22/2017 11:49  Ref. Range 05/21/2017 11:23 05/21/2017 18:02 05/22/2017 04:29  Glucose Latest Ref Range: 65 - 99 mg/dL 92 78 83   Review of Glycemic Control  Diabetes history: No Outpatient Diabetes medications: NA Current orders for Inpatient glycemic control: Novolog 0-9 units TID with meals, Novolog 0-5 units QHS  Inpatient Diabetes Program Recommendations: Correction (SSI): Please consider discontinuing CBGs and Novolog correction scale.  NOTE: In reviewing chart, noted patient does not have DM hx. Patient was initially given insulin along with D50 for hyperkalemia on 05/21/17.  CBGs today are 98 mg/dl and 96 mg/dl.  Thanks, Barnie Alderman, RN, MSN, CDE Diabetes Coordinator Inpatient Diabetes Program [phone] (Team Pager from 8am to 5pm)

## 2017-05-22 NOTE — Progress Notes (Signed)
Patient alert and oriented, vss, no complaints of pain.  D/C telemetry and PIV.  No questions.  To be escorted out of hospital via wheelchair by volunteers.

## 2017-05-22 NOTE — Discharge Summary (Signed)
[facility] [place] [facility] NAME: Michele Nash    MR#:  [id number]  DATE OF BIRTH:  1956/10/18  DATE OF ADMISSION:  05/21/2017   ADMITTING PHYSICIAN: Nicholes Mango, MD  DATE OF DISCHARGE: 05/22/2017  2:19 PM  PRIMARY CARE PHYSICIAN: [name], MD   ADMISSION DIAGNOSIS:  Hyperkalemia [E87.5] DISCHARGE DIAGNOSIS:  Active Problems:   Hyperkalemia  SECONDARY DIAGNOSIS:   Past Medical History:  Diagnosis Date  . Altered mental status   . Anemia   . Apnea, sleep    for sleep study 10/17/15-no cpap yet  . Arthritis   . Bronchitis   . Chronic kidney disease   . COPD (chronic obstructive pulmonary disease) ([place])   . Depression   . Dialysis patient ([place]) 2014  . GERD (gastroesophageal reflux disease)   . GIB (gastrointestinal bleeding) 07/09/2016  . Headache   . Hyperlipidemia   . Hypertension   . Obesity   . Pulmonary edema 09/14/2016  . Renal dialysis device, implant, or graft complication    RIGHT CHEST CATH  . Respiratory failure ([place]) 07/23/2016  . Traumatic hematoma of forehead   . Type II diabetes mellitus ([place]) 09/21/2009   HOSPITAL COURSE:  Michele Nash  is a 61 y.o. female with a known history of ESRD on HD ,COPD ,GERD and other medical problems, didn't get her HD 2 times, went for HD on the day of admission, but she was sentover to the ED for hyperkalemia.Patient was given insulin, D50, albuterol nebulizer treatments and Kayexalate. They requested admission   # Acute hyperkalemia S/p Hemodialysis and resolved She is feeling back to baseline and was dc home in stable condition  DISCHARGE CONDITIONS:  stable CONSULTS OBTAINED:   DRUG ALLERGIES:   Allergies  Allergen Reactions  . Chantix [Varenicline]     hallucinations  . Sulfa Antibiotics Itching, Swelling, Rash and Other (See Comments)    Reaction:  Facial/body swelling    DISCHARGE MEDICATIONS:   Allergies as of 05/22/2017      Reactions   Chantix  [varenicline]    hallucinations   Sulfa Antibiotics Itching, Swelling, Rash, Other (See Comments)   Reaction:  Facial/body swelling       Medication List    TAKE these medications   acetaminophen 325 MG tablet Commonly known as:  TYLENOL Take 2 tablets (650 mg total) by mouth every 6 (six) hours as needed for mild pain (or Fever >/= 101).   albuterol 108 (90 Base) MCG/ACT inhaler Commonly known as:  PROVENTIL HFA;VENTOLIN HFA Inhale 2 puffs into the lungs every 6 (six) hours as needed for wheezing or shortness of breath.   ALPRAZolam 0.5 MG tablet Commonly known as:  XANAX Take 0.5 mg by mouth 2 (two) times daily.   amLODipine 10 MG tablet Commonly known as:  NORVASC Take 10 mg by mouth daily.   calcium acetate 667 MG capsule Commonly known as:  PHOSLO Take 667 mg by mouth 2 (two) times daily.   carvedilol 12.5 MG tablet Commonly known as:  COREG Take 12.5 mg by mouth 2 (two) times daily with a meal.   cinacalcet 30 MG tablet Commonly known as:  SENSIPAR Take 60 mg by mouth daily with breakfast.   ezetimibe 10 MG tablet Commonly known as:  ZETIA Take 10 mg by mouth daily.   feeding supplement (ENSURE ENLIVE) Liqd Take 237 mLs by mouth 3 (three) times daily between meals.   feeding supplement (PRO-STAT SUGAR FREE 64) Liqd  Take 30 mLs by mouth daily.   fluticasone 50 MCG/ACT nasal spray Commonly known as:  FLONASE Place 2 sprays into both nostrils daily as needed for rhinitis.   furosemide 40 MG tablet Commonly known as:  LASIX Take 40 mg by mouth every Tuesday, Thursday, Saturday, and Sunday.   ipratropium-albuterol 0.5-2.5 (3) MG/3ML Soln Commonly known as:  DUONEB Take 3 mLs by nebulization every 6 (six) hours as needed.   lidocaine-prilocaine cream Commonly known as:  EMLA Apply 1 application topically as needed (prior to accessing port).   losartan 100 MG tablet Commonly known as:  COZAAR Take 100 mg by mouth daily.   multivitamin Tabs  tablet Take 1 tablet by mouth at bedtime.   ondansetron 4 MG tablet Commonly known as:  ZOFRAN Take 1 tablet (4 mg total) by mouth every 8 (eight) hours as needed for nausea or vomiting.   pantoprazole 40 MG tablet Commonly known as:  PROTONIX Take 40 mg by mouth 2 (two) times daily.   senna-docusate 8.6-50 MG tablet Commonly known as:  Senokot-S Take 1 tablet by mouth at bedtime as needed for mild constipation.   sertraline 50 MG tablet Commonly known as:  ZOLOFT Take 50 mg by mouth daily.   traMADol 50 MG tablet Commonly known as:  ULTRAM Take 1 tablet (50 mg total) by mouth every 12 (twelve) hours as needed for moderate pain.        DISCHARGE INSTRUCTIONS:   DIET:  Regular diet DISCHARGE CONDITION:  Good ACTIVITY:  Activity as tolerated OXYGEN:  Home Oxygen: No.  Oxygen Delivery: room air DISCHARGE LOCATION:  home   If you experience worsening of your admission symptoms, develop shortness of breath, life threatening emergency, suicidal or homicidal thoughts you must seek medical attention immediately by calling 911 or calling your MD immediately  if symptoms less severe.  You Must read complete instructions/literature along with all the possible adverse reactions/side effects for all the Medicines you take and that have been prescribed to you. Take any new Medicines after you have completely understood and accpet all the possible adverse reactions/side effects.   Please note  You were cared for by a hospitalist during your hospital stay. If you have any questions about your discharge medications or the care you received while you were in the hospital after you are discharged, you can call the unit and asked to speak with the hospitalist on call if the hospitalist that took care of you is not available. Once you are discharged, your primary care physician will handle any further medical issues. Please note that NO REFILLS for any discharge medications will be  authorized once you are discharged, as it is imperative that you return to your primary care physician (or establish a relationship with a primary care physician if you do not have one) for your aftercare needs so that they can reassess your need for medications and monitor your lab values.    On the day of Discharge:  VITAL SIGNS:  Blood pressure (!) 175/76, pulse 67, temperature 97.6 F (36.4 C), temperature source Oral, resp. rate 18, height 5\' 5"  (1.651 m), weight 60.1 kg (132 lb 8 oz), SpO2 99 %. PHYSICAL EXAMINATION:  GENERAL:  61 y.o.-year-old patient lying in the bed with no acute distress.  EYES: Pupils equal, round, reactive to light and accommodation. No scleral icterus. Extraocular muscles intact.  HEENT: Head atraumatic, normocephalic. Oropharynx and nasopharynx clear.  NECK:  Supple, no jugular venous distention. No thyroid enlargement, no  tenderness.  LUNGS: Normal breath sounds bilaterally, no wheezing, rales,rhonchi or crepitation. No use of accessory muscles of respiration.  CARDIOVASCULAR: S1, S2 normal. No murmurs, rubs, or gallops.  ABDOMEN: Soft, non-tender, non-distended. Bowel sounds present. No organomegaly or mass.  EXTREMITIES: No pedal edema, cyanosis, or clubbing.  NEUROLOGIC: Cranial nerves II through XII are intact. Muscle strength 5/5 in all extremities. Sensation intact. Gait not checked.  PSYCHIATRIC: The patient is alert and oriented x 3.  SKIN: No obvious rash, lesion, or ulcer.  DATA REVIEW:   CBC  Recent Labs Lab 05/22/17 0429  WBC 6.4  HGB 9.7*  HCT 29.7*  PLT 198    Chemistries   Recent Labs Lab 05/22/17 0429  NA 142  K 4.6  CL 100*  CO2 33*  GLUCOSE 83  BUN 53*  CREATININE 8.95*  CALCIUM 7.1*  AST 21  ALT 15  ALKPHOS 171*  BILITOT 0.6     Follow-up Information    [name], MD. Schedule an appointment as soon as possible for a visit in 1 week(s).   Specialty:  Internal Medicine Contact information: [address] [place] [address] [phone]        Carloyn Manner, MD. Schedule an appointment as soon as possible for a visit in 1 week(s).   Specialty:  Otolaryngology Contact information: [place] [address] [phone]          Management plans discussed with the patient, family and they are in agreement.  CODE STATUS: Prior   TOTAL TIME TAKING CARE OF THIS PATIENT: 45 minutes.    Max Sane M.D on 05/22/2017 at 10:36 PM  Between 7am to 6pm - Pager - [phone]  After 6pm go to [email or web address] - Proofreader  [facility]  Office  [phone]  CC: Primary care physician; [name], MD   Note: This dictation was prepared with Dragon dictation along with smaller phrase technology. Any transcriptional errors that result from this process are unintentional.

## 2017-05-22 NOTE — Discharge Instructions (Signed)
Hyperkalemia °Hyperkalemia is when you have too much potassium in your blood. Potassium is normally removed (excreted) from your body by your kidneys. If there is too much potassium in your blood, it can affect how your heart works. °Follow these instructions at home: °· Take medicines only as told by your doctor. °· Do not take any supplements, natural products, herbs, or vitamins unless your doctor says it is okay. °· Limit your alcohol intake as told by your doctor. °· Stop illegal drug use. If you need help quitting, ask your doctor. °· Keep all follow-up visits as told by your doctor. This is important. °· If you have kidney disease, you may need to follow a low potassium diet. A food specialist (dietitian) can help you. °Contact a doctor if: °· Your heartbeat is not regular or very slow. °· You feel dizzy (light-headed). °· You feel weak. °· You feel sick to your stomach (nauseous). °· You have tingling in your hands or feet. °· You cannot feel your hands or feet. °Get help right away if: °· You are short of breath. °· You have chest pain. °· You pass out (faint). °· You cannot move your muscles. °This information is not intended to replace advice given to you by your health care provider. Make sure you discuss any questions you have with your health care provider. °Document Released: 11/12/2005 Document Revised: 04/19/2016 Document Reviewed: 02/17/2014 °Elsevier Interactive Patient Education © 2018 [employer]vier Inc[employer] ° °

## 2017-05-23 ENCOUNTER — Encounter (INDEPENDENT_AMBULATORY_CARE_PROVIDER_SITE_OTHER): Payer: Medicare Other

## 2017-05-23 ENCOUNTER — Encounter
Admit: 2017-05-23 | Discharge: 2017-05-23 | Disposition: A | Discharge status: Home / Self Care | Payer: Medicare Other | Attending: Vascular Surgery | Admitting: Vascular Surgery

## 2017-05-23 ENCOUNTER — Ambulatory Visit (INDEPENDENT_AMBULATORY_CARE_PROVIDER_SITE_OTHER): Payer: Medicare Other | Admitting: Vascular Surgery

## 2017-05-23 DIAGNOSIS — Z992 Dependence on renal dialysis: Secondary | ICD-10-CM | POA: Diagnosis not present

## 2017-05-23 DIAGNOSIS — Z79899 Other long term (current) drug therapy: Secondary | ICD-10-CM | POA: Diagnosis not present

## 2017-05-23 DIAGNOSIS — Z809 Family history of malignant neoplasm, unspecified: Secondary | ICD-10-CM | POA: Diagnosis not present

## 2017-05-23 DIAGNOSIS — E1122 Type 2 diabetes mellitus with diabetic chronic kidney disease: Secondary | ICD-10-CM | POA: Diagnosis not present

## 2017-05-23 DIAGNOSIS — N186 End stage renal disease: Secondary | ICD-10-CM | POA: Diagnosis present

## 2017-05-23 DIAGNOSIS — R4182 Altered mental status, unspecified: Secondary | ICD-10-CM | POA: Diagnosis not present

## 2017-05-23 DIAGNOSIS — Z888 Allergy status to other drugs, medicaments and biological substances status: Secondary | ICD-10-CM | POA: Diagnosis not present

## 2017-05-23 DIAGNOSIS — I120 Hypertensive chronic kidney disease with stage 5 chronic kidney disease or end stage renal disease: Secondary | ICD-10-CM | POA: Diagnosis not present

## 2017-05-23 DIAGNOSIS — J9610 Chronic respiratory failure, unspecified whether with hypoxia or hypercapnia: Secondary | ICD-10-CM | POA: Diagnosis not present

## 2017-05-23 DIAGNOSIS — E785 Hyperlipidemia, unspecified: Secondary | ICD-10-CM | POA: Diagnosis not present

## 2017-05-23 DIAGNOSIS — E43 Unspecified severe protein-calorie malnutrition: Secondary | ICD-10-CM | POA: Diagnosis not present

## 2017-05-23 DIAGNOSIS — F17200 Nicotine dependence, unspecified, uncomplicated: Secondary | ICD-10-CM | POA: Diagnosis not present

## 2017-05-23 DIAGNOSIS — Z90710 Acquired absence of both cervix and uterus: Secondary | ICD-10-CM | POA: Diagnosis not present

## 2017-05-23 DIAGNOSIS — Z8249 Family history of ischemic heart disease and other diseases of the circulatory system: Secondary | ICD-10-CM | POA: Diagnosis not present

## 2017-05-23 DIAGNOSIS — F411 Generalized anxiety disorder: Secondary | ICD-10-CM | POA: Diagnosis not present

## 2017-05-23 LAB — PROTIME-INR
INR: 1.06
Prothrombin Time: 13.8 seconds (ref 11.4–15.2)

## 2017-05-23 LAB — TYPE AND SCREEN
ABO/RH(D): O POS
ANTIBODY SCREEN: NEGATIVE

## 2017-05-23 LAB — APTT: aPTT: 30 seconds (ref 24–36)

## 2017-05-23 MED ORDER — CEFAZOLIN SODIUM-DEXTROSE 2-4 GM/100ML-% IV SOLN
2.0000 g | INTRAVENOUS | Status: AC
  Administered 2017-05-24: 2 g via INTRAVENOUS

## 2017-05-23 NOTE — Patient Instructions (Signed)
  Your procedure is scheduled [id number] Friday May 24, 2017. Report to Same Day Surgery at 10:30 AM.   Remember: Instructions that are not followed completely may result in serious medical risk, up to and including death, or upon the discretion of your surgeon and anesthesiologist your surgery may need to be rescheduled.    _x___ 1. Do not eat food or drink liquids after midnight. No gum chewing or hard candies.     ____ 2. No Alcohol for 24 hours before or after surgery.   ____ 3. Bring all medications with you on the day of surgery if instructed.    __x__ 4. Notify your doctor if there is any change in your medical condition     (cold, fever, infections).    __X___ 5. No smoking 24 hours prior to surgery.     Do not wear jewelry, make-up, hairpins, clips or nail polish.  Do not wear lotions, powders, or perfumes.   Do not shave 48 hours prior to surgery. Men may shave face and neck.  Do not bring valuables to the hospital.    [facility] is not responsible for any belongings or valuables.               Contacts, dentures or bridgework may not be worn into surgery.  Leave your suitcase in the car. After surgery it may be brought to your room.  For patients admitted to the hospital, discharge time is determined by your treatment team.   Patients discharged the day of surgery will not be allowed to drive home.    Please read over the following fact sheets that you were given:   [facility] Preparing for Surgery  _X___ Take these medicines the morning of surgery with A SIP OF WATER:    1. amLODipine (NORVASC)  2. carvedilol (COREG)  3. ezetimibe (ZETIA)  4. losartan (COZAAR)   5. pantoprazole (PROTONIX)    ____ Fleet Enema (as directed)   _X___ Use CHG Soap as directed on instruction sheet  __X__ Use inhalers on the day of surgery and bring to hospital day of surgery  ____ Stop metformin 2 days prior to surgery    ____ Take 1/2 of usual insulin dose the night before  surgery and none on the morning of surgery.   ____ Stop Coumadin/Plavix/aspirin on DOES NOT APPLY.  ____ Stop Anti-inflammatories such as Advil, Aleve, Ibuprofen, Motrin, Naproxen,  Naprosyn, Goodies powders or aspirin  products. OK to take Tylenol.   ____ Stop supplements until after surgery.    _X___ Bring C-Pap to the hospital.

## 2017-05-24 ENCOUNTER — Ambulatory Visit
Admission: RE | Admit: 2017-05-24 | Discharge: 2017-05-24 | Disposition: A | Discharge status: Home / Self Care | Payer: Medicare Other | Source: Ambulatory Visit | Attending: Vascular Surgery | Admitting: Vascular Surgery

## 2017-05-24 ENCOUNTER — Ambulatory Visit: Payer: Medicare Other | Admitting: Anesthesiology

## 2017-05-24 ENCOUNTER — Encounter
Admission: RE | Disposition: A | Discharge status: Home / Self Care | Payer: Self-pay | Source: Ambulatory Visit | Attending: Vascular Surgery

## 2017-05-24 ENCOUNTER — Ambulatory Visit: Payer: Medicare Other

## 2017-05-24 ENCOUNTER — Encounter: Payer: Self-pay | Admitting: Anesthesiology

## 2017-05-24 DIAGNOSIS — N186 End stage renal disease: Secondary | ICD-10-CM | POA: Insufficient documentation

## 2017-05-24 DIAGNOSIS — E43 Unspecified severe protein-calorie malnutrition: Secondary | ICD-10-CM | POA: Insufficient documentation

## 2017-05-24 DIAGNOSIS — F17200 Nicotine dependence, unspecified, uncomplicated: Secondary | ICD-10-CM | POA: Insufficient documentation

## 2017-05-24 DIAGNOSIS — Z90710 Acquired absence of both cervix and uterus: Secondary | ICD-10-CM | POA: Insufficient documentation

## 2017-05-24 DIAGNOSIS — I120 Hypertensive chronic kidney disease with stage 5 chronic kidney disease or end stage renal disease: Secondary | ICD-10-CM | POA: Insufficient documentation

## 2017-05-24 DIAGNOSIS — R4182 Altered mental status, unspecified: Secondary | ICD-10-CM | POA: Insufficient documentation

## 2017-05-24 DIAGNOSIS — F411 Generalized anxiety disorder: Secondary | ICD-10-CM | POA: Insufficient documentation

## 2017-05-24 DIAGNOSIS — Z888 Allergy status to other drugs, medicaments and biological substances status: Secondary | ICD-10-CM | POA: Insufficient documentation

## 2017-05-24 DIAGNOSIS — Z8249 Family history of ischemic heart disease and other diseases of the circulatory system: Secondary | ICD-10-CM | POA: Insufficient documentation

## 2017-05-24 DIAGNOSIS — Z79899 Other long term (current) drug therapy: Secondary | ICD-10-CM | POA: Insufficient documentation

## 2017-05-24 DIAGNOSIS — Z809 Family history of malignant neoplasm, unspecified: Secondary | ICD-10-CM | POA: Insufficient documentation

## 2017-05-24 DIAGNOSIS — Z992 Dependence on renal dialysis: Secondary | ICD-10-CM | POA: Insufficient documentation

## 2017-05-24 DIAGNOSIS — E1122 Type 2 diabetes mellitus with diabetic chronic kidney disease: Secondary | ICD-10-CM | POA: Diagnosis not present

## 2017-05-24 DIAGNOSIS — J9610 Chronic respiratory failure, unspecified whether with hypoxia or hypercapnia: Secondary | ICD-10-CM | POA: Insufficient documentation

## 2017-05-24 DIAGNOSIS — E785 Hyperlipidemia, unspecified: Secondary | ICD-10-CM | POA: Insufficient documentation

## 2017-05-24 HISTORY — PX: LIGATION OF ARTERIOVENOUS  FISTULA: SHX5948

## 2017-05-24 LAB — GLUCOSE, CAPILLARY
Glucose-Capillary: 93 mg/dL (ref 65–99)
Glucose-Capillary: 112 mg/dL — ABNORMAL HIGH (ref 65–99)

## 2017-05-24 LAB — POCT I-STAT 4, (NA,K, GLUC, HGB,HCT)
GLUCOSE: 92 mg/dL (ref 65–99)
HEMATOCRIT: 28 % — AB (ref 36.0–46.0)
HEMOGLOBIN: 9.5 g/dL — AB (ref 12.0–15.0)
Potassium: 4 mmol/L (ref 3.5–5.1)
Sodium: 136 mmol/L (ref 135–145)

## 2017-05-24 SURGERY — LIGATION OF ARTERIOVENOUS  FISTULA
Anesthesia: General | Laterality: Left | Wound class: Clean

## 2017-05-24 MED ORDER — PROPOFOL 10 MG/ML IV BOLUS
INTRAVENOUS | Status: AC
  Filled 2017-05-24: qty 20

## 2017-05-24 MED ORDER — FENTANYL CITRATE (PF) 100 MCG/2ML IJ SOLN
INTRAMUSCULAR | Status: AC
Start: 2017-05-24 — End: ?
  Filled 2017-05-24: qty 2

## 2017-05-24 MED ORDER — FENTANYL CITRATE (PF) 100 MCG/2ML IJ SOLN
INTRAMUSCULAR | Status: DC | PRN
  Administered 2017-05-24 (×2): 50 ug via INTRAVENOUS

## 2017-05-24 MED ORDER — HYDRALAZINE HCL 20 MG/ML IJ SOLN
10.0000 mg | Freq: Once | INTRAMUSCULAR | Status: AC
Start: 2017-05-24 — End: 2017-05-24
  Administered 2017-05-24: 10 mg via INTRAVENOUS

## 2017-05-24 MED ORDER — NEOSTIGMINE METHYLSULFATE 10 MG/10ML IV SOLN
INTRAVENOUS | Status: AC
  Filled 2017-05-24: qty 1

## 2017-05-24 MED ORDER — DEXAMETHASONE SODIUM PHOSPHATE 10 MG/ML IJ SOLN
10.0000 mg | Freq: Once | INTRAMUSCULAR | Status: AC
  Administered 2017-05-24: 10 mg via INTRAVENOUS

## 2017-05-24 MED ORDER — GLYCOPYRROLATE 0.2 MG/ML IJ SOLN
INTRAMUSCULAR | Status: DC | PRN
  Administered 2017-05-24: 0.6 mg via INTRAVENOUS

## 2017-05-24 MED ORDER — BUPIVACAINE-EPINEPHRINE 0.25% -1:200000 IJ SOLN
INTRAMUSCULAR | Status: DC | PRN
  Administered 2017-05-24: 5 mL

## 2017-05-24 MED ORDER — HYDROCODONE-ACETAMINOPHEN 5-325 MG PO TABS: 1.0000 | ORAL_TABLET | Freq: Four times a day (QID) | ORAL | 0 refills | Status: DC | PRN

## 2017-05-24 MED ORDER — OXYCODONE HCL 5 MG PO TABS
ORAL_TABLET | ORAL | Status: AC
  Administered 2017-05-24: 5 mg via ORAL
  Filled 2017-05-24: qty 1

## 2017-05-24 MED ORDER — LABETALOL HCL 5 MG/ML IV SOLN
10.0000 mg | Freq: Once | INTRAVENOUS | Status: AC
  Administered 2017-05-24: 10 mg via INTRAVENOUS

## 2017-05-24 MED ORDER — ONDANSETRON HCL 4 MG/2ML IJ SOLN
INTRAMUSCULAR | Status: AC
  Filled 2017-05-24: qty 2

## 2017-05-24 MED ORDER — PROPOFOL 10 MG/ML IV BOLUS
INTRAVENOUS | Status: DC | PRN
  Administered 2017-05-24: 30 mg via INTRAVENOUS
  Administered 2017-05-24: 90 mg via INTRAVENOUS

## 2017-05-24 MED ORDER — CHLORHEXIDINE GLUCONATE CLOTH 2 % EX PADS: 6.0000 | MEDICATED_PAD | Freq: Once | CUTANEOUS | Status: DC

## 2017-05-24 MED ORDER — SODIUM CHLORIDE 0.9 % IV SOLN
INTRAVENOUS | Status: DC | PRN
  Administered 2017-05-24: 50 ug/min via INTRAVENOUS

## 2017-05-24 MED ORDER — PHENYLEPHRINE HCL 10 MG/ML IJ SOLN
INTRAMUSCULAR | Status: DC | PRN
  Administered 2017-05-24 (×2): 100 ug via INTRAVENOUS

## 2017-05-24 MED ORDER — NEOSTIGMINE METHYLSULFATE 10 MG/10ML IV SOLN
INTRAVENOUS | Status: DC | PRN
  Administered 2017-05-24: 4 mg via INTRAVENOUS

## 2017-05-24 MED ORDER — HYDRALAZINE HCL 20 MG/ML IJ SOLN
INTRAMUSCULAR | Status: AC
  Filled 2017-05-24: qty 1

## 2017-05-24 MED ORDER — MIDAZOLAM HCL 2 MG/2ML IJ SOLN
INTRAMUSCULAR | Status: AC
  Filled 2017-05-24: qty 2

## 2017-05-24 MED ORDER — HYDRALAZINE HCL 20 MG/ML IJ SOLN
10.0000 mg | Freq: Once | INTRAMUSCULAR | Status: AC
  Administered 2017-05-24: 10 mg via INTRAVENOUS

## 2017-05-24 MED ORDER — LIDOCAINE HCL (PF) 2 % IJ SOLN
INTRAMUSCULAR | Status: AC
  Filled 2017-05-24: qty 2

## 2017-05-24 MED ORDER — ONDANSETRON HCL 4 MG/2ML IJ SOLN
4.0000 mg | Freq: Once | INTRAMUSCULAR | Status: AC
  Administered 2017-05-24: 4 mg via INTRAVENOUS

## 2017-05-24 MED ORDER — LABETALOL HCL 5 MG/ML IV SOLN
INTRAVENOUS | Status: AC
  Filled 2017-05-24: qty 4

## 2017-05-24 MED ORDER — OXYCODONE HCL 5 MG/5ML PO SOLN: 5.0000 mg | Freq: Once | ORAL | Status: AC | PRN

## 2017-05-24 MED ORDER — BUPIVACAINE-EPINEPHRINE (PF) 0.25% -1:200000 IJ SOLN
INTRAMUSCULAR | Status: AC
  Filled 2017-05-24: qty 30

## 2017-05-24 MED ORDER — FENTANYL CITRATE (PF) 100 MCG/2ML IJ SOLN: 25.0000 ug | INTRAMUSCULAR | Status: DC | PRN

## 2017-05-24 MED ORDER — GLYCOPYRROLATE 0.2 MG/ML IJ SOLN
INTRAMUSCULAR | Status: AC
  Filled 2017-05-24: qty 3

## 2017-05-24 MED ORDER — LIDOCAINE HCL (CARDIAC) 20 MG/ML IV SOLN
INTRAVENOUS | Status: DC | PRN
  Administered 2017-05-24: 100 mg via INTRAVENOUS

## 2017-05-24 MED ORDER — HEPARIN SODIUM (PORCINE) 10000 UNIT/ML IJ SOLN
INTRAMUSCULAR | Status: AC
  Filled 2017-05-24: qty 1

## 2017-05-24 MED ORDER — SODIUM CHLORIDE 0.9 % IV SOLN
INTRAVENOUS | Status: DC
  Administered 2017-05-24: 12:00:00 via INTRAVENOUS

## 2017-05-24 MED ORDER — ROCURONIUM BROMIDE 100 MG/10ML IV SOLN
INTRAVENOUS | Status: DC | PRN
  Administered 2017-05-24 (×2): 20 mg via INTRAVENOUS
  Administered 2017-05-24: 30 mg via INTRAVENOUS

## 2017-05-24 MED ORDER — ROCURONIUM BROMIDE 50 MG/5ML IV SOLN
INTRAVENOUS | Status: AC
  Filled 2017-05-24: qty 1

## 2017-05-24 MED ORDER — OXYCODONE HCL 5 MG PO TABS
5.0000 mg | ORAL_TABLET | Freq: Once | ORAL | Status: AC | PRN
  Administered 2017-05-24: 5 mg via ORAL

## 2017-05-24 SURGICAL SUPPLY — 48 items
BAG DECANTER FOR FLEXI CONT (MISCELLANEOUS) ×3 IMPLANT
BLADE SURG SZ11 CARB STEEL (BLADE) ×3 IMPLANT
BOOT SUTURE AID YELLOW STND (SUTURE) IMPLANT
CANISTER SUCT 1200ML W/VALVE (MISCELLANEOUS) ×3 IMPLANT
CHLORAPREP W/TINT 26ML (MISCELLANEOUS) ×3 IMPLANT
CLIP TI MEDIUM 6 (CLIP) IMPLANT
CLIP TI WIDE RED SMALL 6 (CLIP) IMPLANT
DERMABOND ADVANCED (GAUZE/BANDAGES/DRESSINGS) ×2
DERMABOND ADVANCED .7 DNX12 (GAUZE/BANDAGES/DRESSINGS) ×1 IMPLANT
DRESSING SURGICEL FIBRLLR 1X2 (HEMOSTASIS) IMPLANT
DRSG SURGICEL FIBRILLAR 1X2 (HEMOSTASIS)
ELECT CAUTERY BLADE 6.4 (BLADE) IMPLANT
ELECT REM PT RETURN 9FT ADLT (ELECTROSURGICAL) ×3
ELECTRODE REM PT RTRN 9FT ADLT (ELECTROSURGICAL) ×1 IMPLANT
GLOVE SURG SYN 8.0 (GLOVE) ×9 IMPLANT
GOWN STRL REUS W/ TWL LRG LVL3 (GOWN DISPOSABLE) ×1 IMPLANT
GOWN STRL REUS W/ TWL XL LVL3 (GOWN DISPOSABLE) ×1 IMPLANT
GOWN STRL REUS W/TWL LRG LVL3 (GOWN DISPOSABLE) ×2
GOWN STRL REUS W/TWL XL LVL3 (GOWN DISPOSABLE) ×2
IV NS 500ML (IV SOLUTION) ×2
IV NS 500ML BAXH (IV SOLUTION) ×1 IMPLANT
KIT RM TURNOVER STRD PROC AR (KITS) ×3 IMPLANT
LABEL OR SOLS (LABEL) ×3 IMPLANT
LOOP RED MAXI  1X406MM (MISCELLANEOUS)
LOOP VESSEL MAXI 1X406 RED (MISCELLANEOUS) IMPLANT
LOOP VESSEL MINI 0.8X406 BLUE (MISCELLANEOUS) ×1 IMPLANT
LOOPS BLUE MINI 0.8X406MM (MISCELLANEOUS) ×2
MICROPUNCTURE 5FR NT-U-SST (MISCELLANEOUS) ×3
NEEDLE FILTER BLUNT 18X 1/2SAF (NEEDLE)
NEEDLE FILTER BLUNT 18X1 1/2 (NEEDLE) IMPLANT
PACK EXTREMITY ARMC (MISCELLANEOUS) ×3 IMPLANT
PAD PREP 24X41 OB/GYN DISP (PERSONAL CARE ITEMS) IMPLANT
SET MICROPUNCTURE 5FR NT-U-SST (MISCELLANEOUS) ×1 IMPLANT
STOCKINETTE STRL 4IN 9604848 (GAUZE/BANDAGES/DRESSINGS) ×3 IMPLANT
SUT ETHIBOND 0 36 GRN (SUTURE) IMPLANT
SUT MNCRL+ 5-0 UNDYED PC-3 (SUTURE) ×1 IMPLANT
SUT MONOCRYL 5-0 (SUTURE) ×2
SUT PROLENE 6 0 BV (SUTURE) IMPLANT
SUT SILK 2 0 (SUTURE) ×2
SUT SILK 2-0 18XBRD TIE 12 (SUTURE) ×1 IMPLANT
SUT SILK 3 0 (SUTURE)
SUT SILK 3-0 18XBRD TIE 12 (SUTURE) IMPLANT
SUT SILK 4 0 (SUTURE)
SUT SILK 4-0 18XBRD TIE 12 (SUTURE) IMPLANT
SUT VIC AB 3-0 SH 27 (SUTURE) ×2
SUT VIC AB 3-0 SH 27X BRD (SUTURE) ×1 IMPLANT
SYR 20CC LL (SYRINGE) ×3 IMPLANT
SYR 3ML LL SCALE MARK (SYRINGE) IMPLANT

## 2017-05-24 NOTE — Transfer of Care (Signed)
Immediate Anesthesia Transfer of Care Note  Patient: Michele Nash  Procedure(s) Performed: Procedure(s): LIGATION OF ARTERIOVENOUS  FISTULA ( PERMCATH INSERTION ) (Left)  Patient Location: PACU  Anesthesia Type:General  Level of Consciousness: awake, oriented and patient cooperative  Airway & Oxygen Therapy: Patient Spontanous Breathing and Patient connected to face mask oxygen  Post-op Assessment: Report given to RN, Post -op Vital signs reviewed and stable and Patient moving all extremities X 4  Post vital signs: Reviewed and stable  Last Vitals:  Vitals:   05/24/17 1053  BP: (!) 168/81  Pulse: 67  Resp: 18  Temp: 36.8 C    Last Pain:  Vitals:   05/24/17 1053  TempSrc: Tympanic         Complications: No apparent anesthesia complications

## 2017-05-24 NOTE — Op Note (Signed)
[facility]   OPERATIVE NOTE     PROCEDURE: 1. Insertion of a right internal jugular tunneled dialysis catheter. 2. Catheter placement and cannulation under ultrasound and fluoroscopic guidance 3.  Ligation of left brachiocephalic fistula  PRE-OPERATIVE DIAGNOSIS: end-stage renal requiring hemodialysis  POST-OPERATIVE DIAGNOSIS: same as above  SURGEON: Hortencia Pilar  ANESTHESIA: Gen. by LMA  ESTIMATED BLOOD LOSS: Minimal  FINDING(S): 1.  Tips of the catheter in the right atrium on fluoroscopy 2.  No obvious pneumothorax on fluoroscopy  SPECIMEN(S):  none  INDICATIONS:   Michele Nash is a 61 y.o. female  presents with end stage renal disease.  Therefore, the patient requires a tunneled dialysis catheter placement.  The patient is informed of  the risks catheter placement include but are not limited to: bleeding, infection, central venous injury, pneumothorax, possible venous stenosis, possible malpositioning in the venous system, and possible infections related to long-term catheter presence.  The patient was aware of these risks and agreed to proceed.  DESCRIPTION: The patient was taken back to Special Procedure suite.  Prior to sedation, the patient was given IV antibiotics.  After obtaining adequate sedation, the patient was prepped and draped in the standard fashion for a chest or neck tunneled dialysis catheter placement.  Appropriate Time Out is called.     The the right neck and chest wall are then infiltrated with 1% Lidocaine with epinepherine.  A 19 cm tip to cuff palindrome catheter is then selected, opened on the back table and prepped. Ultrasound is placed in a sterile sleeve.  Under ultrasound guidance, the right internal jugular vein is examined and is noted to be echolucent and easily compressible indicating patency.   An image is recorded for the permanent record.  The right internal jugular vein is cannulated with the microneedle under  direct ultrasound vissualization.  A Microwire followed by a micro sheath is then inserted without difficulty.   A J-wire was then advanced under fluoroscopic guidance into the inferior vena cava and the wire was secured.  Small counter incision was then made at the wire insertion site. A small pocket was fashioned with blunt dissection to allow easier passage of the cuff.  The dilator and peel-away sheath are then advanced over the wire under fluoroscopic guidance. The catheters and advanced through the peel-away sheath. It is approximated to the chest wall after verifying the tips at the atrial caval junction and an exit site is selected.  Small incision is made at the selected exit site and the tunneling device was passed subcutaneously to the neck counter incision. Catheter is then pulled through the subcutaneous tunnel. The catheter is then verified for tip position under fluoroscopy, transected and the hub assembly connected.    Each port was tested by aspirating and flushing.  No resistance was noted.  Each port was then thoroughly flushed with heparinized saline.  The catheter was secured in placed with two interrupted stitches of 0 silk tied to the catheter.  The neck incision was closed with a U-stitch of 4-0 Monocryl.  The neck and chest incision were cleaned and sterile bandages applied including a Biopatch.  Each port was then packed with concentrated heparin (1000 Units/mL) at the manufacturer recommended volumes to each port.  Sterile caps were applied to each port.  On completion fluoroscopy, the tips of the catheter were in the right atrium, and there was no evidence of pneumothorax.  Attention is then turned to the left arm. Left arm was  prepped and draped in a circumferential fashion. Appropriate timeout is called again.  Quarter percent Marcaine is infiltrated into the soft tissues surrounding the origin of the fistula near the antecubital fossa. A linear incision is then created and  the dissection is carried down through the soft tissues to expose the fistula. Fistula is then dissected circumferentially. An 0 Ethibond is then used to ligate the fistula circumferentially. Palpation reveals the fistula has been decompressed and there is no longer a thrill or pulsations noted. The wound is then irrigated and closed in layers using 3-0 Vicryl followed by 4-0 Monocryl subcuticular and then Dermabond.  COMPLICATIONS: None  CONDITION: Michele Nash [facility] Office: [phone]   05/24/2017, 1:42 PM

## 2017-05-24 NOTE — Anesthesia Procedure Notes (Addendum)
Procedure Name: Intubation Date/Time: 05/24/2017 12:03 PM Performed by: Silvana Newness Pre-anesthesia Checklist: Patient identified, Emergency Drugs available, Suction available, Patient being monitored and Timeout performed Patient Re-evaluated:Patient Re-evaluated prior to inductionOxygen Delivery Method: Circle system utilized Preoxygenation: Pre-oxygenation with 100% oxygen Intubation Type: IV induction Ventilation: Mask ventilation without difficulty Laryngoscope Size: Mac and 3 Grade View: Grade I Tube type: Oral Tube size: 7.0 mm Number of attempts: 1 Airway Equipment and Method: Stylet Placement Confirmation: ETT inserted through vocal cords under direct vision,  positive ETCO2 and breath sounds checked- equal and bilateral Secured at: 20 cm Tube secured with: Tape Dental Injury: Teeth and Oropharynx as per pre-operative assessment  Comments: Partial dentures removed by Dr. Amie Critchley prior to intubation.  No damage during removal

## 2017-05-24 NOTE — Anesthesia Postprocedure Evaluation (Signed)
Anesthesia Post Note  Patient: Michele Nash  Procedure(s) Performed: Procedure(s) (LRB): LIGATION OF ARTERIOVENOUS  FISTULA ( PERMCATH INSERTION ) (Left)  Patient location during evaluation: PACU Anesthesia Type: General Level of consciousness: awake and alert and oriented Pain management: pain level controlled Vital Signs Assessment: post-procedure vital signs reviewed and stable Respiratory status: spontaneous breathing Cardiovascular status: blood pressure returned to baseline Anesthetic complications: no Comments: Surgeon examined the neck postop for some increased bleeding and pain and cleared the patient.  Patient looked much better at end of PACU stay with more reasonable BPs.       Last Vitals:  Vitals:   05/24/17 1600 05/24/17 1610  BP: (!) 190/88 (!) 189/86  Pulse: (!) 58 (!) 56  Resp: 12 (!) 22  Temp:      Last Pain:  Vitals:   05/24/17 1600  TempSrc:   PainSc: 3                  Monna Crean

## 2017-05-24 NOTE — Discharge Instructions (Addendum)
  AMBULATORY SURGERY  DISCHARGE INSTRUCTIONS   1) The drugs that you were given will stay in your system until tomorrow so for the next 24 hours you should not:  A) Drive an automobile B) Make any legal decisions C) Drink any alcoholic beverage   2) You may resume regular meals tomorrow.  Today it is better to start with liquids and gradually work up to solid foods.  You may eat anything you prefer, but it is better to start with liquids, then soup and crackers, and gradually work up to solid foods.   3) Please notify your doctor immediately if you have any unusual bleeding, trouble breathing, redness and pain at the surgery site, drainage, fever, or pain not relieved by medication.    4) Additional Instructions: TAKE A STOOL SOFTENER TWICE A DAY WHILE TAKING NARCOTIC PAIN MEDICINE TO PREVENT CONSTIPATION   Please contact your physician with any problems or Same Day Surgery at [phone], Monday through Friday 6 am to 4 pm, or [place] [facility] [place] Main number at [phone].   

## 2017-05-24 NOTE — H&P (Signed)
[facility] History & Physical Update  The patient was interviewed and re-examined.  The patient's previous History and Physical has been reviewed and is unchanged.  There is no change in the plan of care. We plan to proceed with the scheduled procedure.  Hortencia Pilar, MD  05/24/2017, 11:24 AM

## 2017-05-24 NOTE — Anesthesia Post-op Follow-up Note (Cosign Needed)
Anesthesia QCDR form completed.        

## 2017-05-24 NOTE — Anesthesia Preprocedure Evaluation (Addendum)
Anesthesia Evaluation  Patient identified by MRN, date of birth, ID band Patient awake    Reviewed: Allergy & Precautions, H&P , NPO status , Patient's Chart, lab work & pertinent test results  History of Anesthesia Complications Negative for: history of anesthetic complications  Airway Mallampati: III  TM Distance: <3 FB Neck ROM: limited    Dental  (+) Poor Dentition, Lower Dentures, Upper Dentures, Missing   Pulmonary shortness of breath, sleep apnea , COPD,  COPD inhaler and oxygen dependent, Current Smoker,           Cardiovascular Exercise Tolerance: Poor hypertension, (-) angina+ DOE  (-) Past MI      Neuro/Psych  Headaches, PSYCHIATRIC DISORDERS Depression    GI/Hepatic negative GI ROS, Neg liver ROS, GERD  Controlled,  Endo/Other  diabetes, Type 2  Renal/GU DialysisRenal disease     Musculoskeletal  (+) Arthritis ,   Abdominal   Peds  Hematology negative hematology ROS (+)   Anesthesia Other Findings Past Medical History: No date: Altered mental status No date: Anemia No date: Apnea, sleep     Comment: for sleep study 10/17/15-no cpap yet No date: Arthritis No date: Bronchitis No date: Chronic kidney disease No date: COPD (chronic obstructive pulmonary disease) (* No date: Depression 2014: Dialysis patient ([place]) No date: GERD (gastroesophageal reflux disease) 07/09/2016: GIB (gastrointestinal bleeding) No date: Headache No date: Hyperlipidemia No date: Hypertension No date: Obesity 09/14/2016: Pulmonary edema No date: Renal dialysis device, implant, or graft compl*     Comment: RIGHT CHEST CATH 07/23/2016: Respiratory failure ([place]) No date: Traumatic hematoma of forehead 09/21/2009: Type II diabetes mellitus ([place])     Comment: diet controlled  Past Surgical History: No date: ABDOMINAL HYSTERECTOMY 2011: COLONOSCOPY 09/20/2015: COLONOSCOPY WITH PROPOFOL N/A     Comment: Procedure:  COLONOSCOPY WITH PROPOFOL;                Surgeon: Lucilla Lame, MD;  Location: [facility]               ENDOSCOPY;  Service: Endoscopy;  Laterality:               N/A; No date: DIALYSIS FISTULA CREATION 07/13/2016: ESOPHAGOGASTRODUODENOSCOPY N/A     Comment: Procedure: ESOPHAGOGASTRODUODENOSCOPY (EGD);                Surgeon: Lollie Sails, MD;  [facility]: [facility]              ENDOSCOPY;  Service: Endoscopy;  Laterality:               N/A; 06/02/2016: ESOPHAGOGASTRODUODENOSCOPY (EGD) WITH PROPOFOL N/A     Comment: Procedure: ESOPHAGOGASTRODUODENOSCOPY (EGD)               WITH PROPOFOL;  Surgeon: Manya Silvas, MD;               Location: [facility] ENDOSCOPY;  Service: Endoscopy;               Laterality: N/A; 05/10/2015: PERIPHERAL VASCULAR CATHETERIZATION N/A     Comment: Procedure: A/V Shuntogram/Fistulagram;                Surgeon: Katha Cabal, MD;  Location: [place] CV LAB;  Service: Cardiovascular;                Laterality: N/A; 05/10/2015: PERIPHERAL VASCULAR CATHETERIZATION Left     Comment: Procedure: A/V Shunt Intervention;  Surgeon:               Katha Cabal, MD;  Location: [place]              CV LAB;  Service: Cardiovascular;  Laterality:               Left; 08/23/2015: PERIPHERAL VASCULAR CATHETERIZATION Left     Comment: Procedure: A/V Shuntogram/Fistulagram;                Surgeon: Katha Cabal, MD;  Location: [place] CV LAB;  Service: Cardiovascular;                Laterality: Left; 08/23/2015: PERIPHERAL VASCULAR CATHETERIZATION N/A     Comment: Procedure: A/V Shunt Intervention;  Surgeon:               Katha Cabal, MD;  Location: [place]              CV LAB;  Service: Cardiovascular;  Laterality:               N/A; 08/23/2015: PERIPHERAL VASCULAR CATHETERIZATION     Comment: Procedure: Dialysis/Perma Catheter Insertion;               Surgeon: Katha Cabal, MD;  Location: [place]  CV LAB;  Service: Cardiovascular;; 01/03/2016: PERIPHERAL VASCULAR CATHETERIZATION N/A     Comment: Procedure: Dialysis/Perma Catheter Removal;                Surgeon: Katha Cabal, MD;  Location: [place] CV LAB;  Service: Cardiovascular;                Laterality: N/A; 10/28/2015: REVISON OF ARTERIOVENOUS FISTULA Left     Comment: Procedure: REVISON OF ARTERIOVENOUS FISTULA               WITH ARTEGRAFT;  Surgeon: Katha Cabal,               MD;  Location: [facility] ORS;  Service: Vascular;                Laterality: Left; 10/28/2015: WOUND DEBRIDEMENT Left     Comment: Procedure: Resection of shoulder cyst ( left               );  Surgeon: Katha Cabal, MD;  Location:               [facility] ORS;  Service: Vascular;  Laterality:               Left;     Reproductive/Obstetrics negative OB ROS                            Anesthesia Physical Anesthesia Plan  ASA: IV  Anesthesia Plan: General ETT   Post-op Pain Management:    Induction: Intravenous  PONV Risk Score and Plan: 3 and Ondansetron, Dexamethasone, Propofol and Midazolam  Airway Management Planned: Oral ETT  Additional Equipment:   Intra-op Plan:   Post-operative Plan: Extubation in OR  Informed Consent: I have reviewed the patients History and Physical, chart, labs and discussed the procedure including the risks, benefits and alternatives for the proposed anesthesia with  the patient or authorized representative who has indicated his/her understanding and acceptance.   Dental Advisory Given  Plan Discussed with: Anesthesiologist, CRNA and Surgeon  Anesthesia Plan Comments: (Patient refusing to remove dentures.  She was consented for increased risk to dentures by leaving them in and she voiced understanding.  She was told that if they look like they may fall out during induction and intubation then we will remove them, she said that this would be ok.  Patient consented  for risks of anesthesia including but not limited to:  - adverse reactions to medications - damage to teeth, lips or other oral mucosa - sore throat or hoarseness - Damage to heart, brain, lungs or loss of life  Patient voiced understanding.)        Anesthesia Quick Evaluation

## 2017-05-25 ENCOUNTER — Encounter: Payer: Self-pay | Admitting: Vascular Surgery

## 2017-06-05 ENCOUNTER — Encounter: Payer: Self-pay | Admitting: Emergency Medicine

## 2017-06-05 ENCOUNTER — Emergency Department: Payer: Medicare Other

## 2017-06-05 ENCOUNTER — Emergency Department
Admission: EM | Admit: 2017-06-05 | Discharge: 2017-06-05 | Disposition: A | Discharge status: Home / Self Care | Payer: Medicare Other | Attending: Emergency Medicine | Admitting: Emergency Medicine

## 2017-06-05 DIAGNOSIS — N644 Mastodynia: Secondary | ICD-10-CM | POA: Diagnosis present

## 2017-06-05 DIAGNOSIS — E119 Type 2 diabetes mellitus without complications: Secondary | ICD-10-CM | POA: Diagnosis not present

## 2017-06-05 DIAGNOSIS — R4182 Altered mental status, unspecified: Secondary | ICD-10-CM | POA: Diagnosis not present

## 2017-06-05 DIAGNOSIS — J449 Chronic obstructive pulmonary disease, unspecified: Secondary | ICD-10-CM | POA: Insufficient documentation

## 2017-06-05 DIAGNOSIS — R233 Spontaneous ecchymoses: Secondary | ICD-10-CM | POA: Insufficient documentation

## 2017-06-05 DIAGNOSIS — F17210 Nicotine dependence, cigarettes, uncomplicated: Secondary | ICD-10-CM | POA: Insufficient documentation

## 2017-06-05 DIAGNOSIS — Z992 Dependence on renal dialysis: Secondary | ICD-10-CM | POA: Diagnosis not present

## 2017-06-05 DIAGNOSIS — R079 Chest pain, unspecified: Secondary | ICD-10-CM

## 2017-06-05 DIAGNOSIS — Z79899 Other long term (current) drug therapy: Secondary | ICD-10-CM | POA: Insufficient documentation

## 2017-06-05 DIAGNOSIS — I129 Hypertensive chronic kidney disease with stage 1 through stage 4 chronic kidney disease, or unspecified chronic kidney disease: Secondary | ICD-10-CM | POA: Diagnosis not present

## 2017-06-05 DIAGNOSIS — N189 Chronic kidney disease, unspecified: Secondary | ICD-10-CM | POA: Insufficient documentation

## 2017-06-05 DIAGNOSIS — R58 Hemorrhage, not elsewhere classified: Secondary | ICD-10-CM

## 2017-06-05 LAB — CBC
HCT: 28.5 % — ABNORMAL LOW (ref 35.0–47.0)
Hemoglobin: 9.2 g/dL — ABNORMAL LOW (ref 12.0–16.0)
MCH: 33.1 pg (ref 26.0–34.0)
MCHC: 32.3 g/dL (ref 32.0–36.0)
MCV: 102.6 fL — AB (ref 80.0–100.0)
PLATELETS: 309 10*3/uL (ref 150–440)
RBC: 2.78 MIL/uL — ABNORMAL LOW (ref 3.80–5.20)
RDW: 14.6 % — AB (ref 11.5–14.5)
WBC: 8.8 10*3/uL (ref 3.6–11.0)

## 2017-06-05 LAB — BASIC METABOLIC PANEL
Anion gap: 17 — ABNORMAL HIGH (ref 5–15)
BUN: 62 mg/dL — AB (ref 6–20)
CO2: 26 mmol/L (ref 22–32)
CREATININE: 10.28 mg/dL — AB (ref 0.44–1.00)
Calcium: 7 mg/dL — ABNORMAL LOW (ref 8.9–10.3)
Chloride: 96 mmol/L — ABNORMAL LOW (ref 101–111)
GFR calc non Af Amer: 4 mL/min — ABNORMAL LOW (ref >60)
GFR, EST AFRICAN AMERICAN: 4 mL/min — AB (ref >60)
GLUCOSE: 253 mg/dL — AB (ref 65–99)
Potassium: 4.8 mmol/L (ref 3.5–5.1)
Sodium: 139 mmol/L (ref 135–145)

## 2017-06-05 LAB — TROPONIN I: TROPONIN I: 0.05 ng/mL — AB (ref <0.03)

## 2017-06-05 LAB — POCT RAPID STREP A: Streptococcus, Group A Screen (Direct): NEGATIVE

## 2017-06-05 NOTE — ED Provider Notes (Signed)
[facility] Emergency Department Provider Note  Time seen: 8:32 AM  I have reviewed the triage vital signs and the nursing notes.   HISTORY  Chief Complaint Breast Pain    HPI Michele Nash is a 61 y.o. female with a past medical history of COPD, ESRD on HD, hypertension, hyperlipidemia, diabetes, who presents to the emergency department for right breast pain and discoloration. According to the patient over the past 2-3 days she has noted bruising and purple discoloration of the right breast she also states some pain if he touches the area. Husband states the patient's right chest catheter was inserted last week. Patient denies any shortness of breath nausea or diaphoresis. Patient was sent from the dialysis center for evaluation, did not receive dialysis today.  Past Medical History:  Diagnosis Date  . Altered mental status   . Anemia   . Apnea, sleep    for sleep study 10/17/15-no cpap yet  . Arthritis   . Bronchitis   . Chronic kidney disease   . COPD (chronic obstructive pulmonary disease) ([place])   . Depression   . Dialysis patient ([place]) 2014  . GERD (gastroesophageal reflux disease)   . GIB (gastrointestinal bleeding) 07/09/2016  . Headache   . Hyperlipidemia   . Hypertension   . Obesity   . Pulmonary edema 09/14/2016  . Renal dialysis device, implant, or graft complication    RIGHT CHEST CATH  . Respiratory failure ([place]) 07/23/2016  . Traumatic hematoma of forehead   . Type II diabetes mellitus ([place]) 09/21/2009   diet controlled    Patient Active Problem List   Diagnosis Date Noted  . Hyperkalemia 05/21/2017  . Respiratory failure, acute and chronic ([place]) 12/04/2016  . Multiple closed fractures of ribs of right side   . Hyperlipidemia 11/28/2016  . COPD exacerbation ([place]) 10/29/2016  . Altered mental status   . Fall   . Traumatic hematoma of forehead   . Acute respiratory failure with hypercapnia ([place]) 10/06/2016  . Pulmonary edema  09/14/2016  . Respiratory failure ([place]) 07/23/2016  . GIB (gastrointestinal bleeding) 07/09/2016  . Protein-calorie malnutrition, severe 05/29/2016  . Hyperglycemia 05/28/2016  . Pain in limb 10/28/2015  . ESRD on dialysis ([place]) 10/28/2015  . Essential hypertension 10/28/2015  . ESRD on hemodialysis ([place]) 03/29/2015  . HTN (hypertension) 03/29/2015  . COPD (chronic obstructive pulmonary disease) ([place]) 03/29/2015  . GAD (generalized anxiety disorder) 03/29/2015  . Chronic kidney disease 10/18/2009  . Personal history of tobacco use, presenting hazards to health 10/18/2009  . Type II diabetes mellitus ([place]) 09/21/2009  . Disorder of uterus 02/14/1990    Past Surgical History:  Procedure Laterality Date  . ABDOMINAL HYSTERECTOMY    . COLONOSCOPY  2011  . COLONOSCOPY WITH PROPOFOL N/A 09/20/2015   Procedure: COLONOSCOPY WITH PROPOFOL;  Surgeon: Lucilla Lame, MD;  Location: [facility] ENDOSCOPY;  Service: Endoscopy;  Laterality: N/A;  . DIALYSIS FISTULA CREATION    . ESOPHAGOGASTRODUODENOSCOPY N/A 07/13/2016   Procedure: ESOPHAGOGASTRODUODENOSCOPY (EGD);  Surgeon: Lollie Sails, MD;  Location: [facility] ENDOSCOPY;  Service: Endoscopy;  Laterality: N/A;  . ESOPHAGOGASTRODUODENOSCOPY (EGD) WITH PROPOFOL N/A 06/02/2016   Procedure: ESOPHAGOGASTRODUODENOSCOPY (EGD) WITH PROPOFOL;  Surgeon: Manya Silvas, MD;  Location: [facility] ENDOSCOPY;  Service: Endoscopy;  Laterality: N/A;  . LIGATION OF ARTERIOVENOUS  FISTULA Left 05/24/2017   Procedure: LIGATION OF ARTERIOVENOUS  FISTULA ( PERMCATH INSERTION );  Surgeon: Katha Cabal, MD;  Location: [facility] ORS;  Service: Vascular;  Laterality: Left;  .  PERIPHERAL VASCULAR CATHETERIZATION N/A 05/10/2015   Procedure: A/V Shuntogram/Fistulagram;  Surgeon: Katha Cabal, MD;  Location: [place] CV LAB;  Service: Cardiovascular;  Laterality: N/A;  . PERIPHERAL VASCULAR CATHETERIZATION Left 05/10/2015   Procedure: A/V Shunt Intervention;  Surgeon: Katha Cabal, MD;  Location: [place] CV LAB;  Service: Cardiovascular;  Laterality: Left;  . PERIPHERAL VASCULAR CATHETERIZATION Left 08/23/2015   Procedure: A/V Shuntogram/Fistulagram;  Surgeon: Katha Cabal, MD;  Location: [place] CV LAB;  Service: Cardiovascular;  Laterality: Left;  . PERIPHERAL VASCULAR CATHETERIZATION N/A 08/23/2015   Procedure: A/V Shunt Intervention;  Surgeon: Katha Cabal, MD;  Location: [place] CV LAB;  Service: Cardiovascular;  Laterality: N/A;  . PERIPHERAL VASCULAR CATHETERIZATION  08/23/2015   Procedure: Dialysis/Perma Catheter Insertion;  Surgeon: Katha Cabal, MD;  Location: [place] CV LAB;  Service: Cardiovascular;;  . PERIPHERAL VASCULAR CATHETERIZATION N/A 01/03/2016   Procedure: Dialysis/Perma Catheter Removal;  Surgeon: Katha Cabal, MD;  Location: [place] CV LAB;  Service: Cardiovascular;  Laterality: N/A;  . REVISON OF ARTERIOVENOUS FISTULA Left 10/28/2015   Procedure: REVISON OF ARTERIOVENOUS FISTULA WITH ARTEGRAFT;  Surgeon: Katha Cabal, MD;  Location: [facility] ORS;  Service: Vascular;  Laterality: Left;  . WOUND DEBRIDEMENT Left 10/28/2015   Procedure: Resection of shoulder cyst ( left );  Surgeon: Katha Cabal, MD;  Location: [facility] ORS;  Service: Vascular;  Laterality: Left;    Prior to Admission medications   Medication Sig Start Date End Date Taking? Authorizing Provider  acetaminophen (TYLENOL) 325 MG tablet Take 2 tablets (650 mg total) by mouth every 6 (six) hours as needed for mild pain (or Fever >/= 101). 12/11/16   Gouru, Illene Silver, MD  albuterol (PROVENTIL HFA;VENTOLIN HFA) 108 (90 Base) MCG/ACT inhaler Inhale 2 puffs into the lungs every 6 (six) hours as needed for wheezing or shortness of breath. 12/11/16   Gouru, Illene Silver, MD  ALPRAZolam Duanne Moron) 0.5 MG tablet Take 0.5 mg by mouth 2 (two) times daily.    [provider]  Amino Acids-Protein Hydrolys (FEEDING SUPPLEMENT, PRO-STAT SUGAR FREE 64,) LIQD  Take 30 mLs by mouth daily. 12/12/16   Gouru, Illene Silver, MD  amLODipine (NORVASC) 10 MG tablet Take 10 mg by mouth daily.     [provider]  calcium acetate (PHOSLO) 667 MG capsule Take 1,334 mg by mouth 3 (three) times daily with meals. 1 tablet with bedtime snack.    [provider]  carvedilol (COREG) 12.5 MG tablet Take 12.5 mg by mouth 2 (two) times daily with a meal.    [provider]  cinacalcet (SENSIPAR) 30 MG tablet Take 60 mg by mouth daily with breakfast.     [provider]  ezetimibe (ZETIA) 10 MG tablet Take 10 mg by mouth daily.     [provider]  feeding supplement, ENSURE ENLIVE, (ENSURE ENLIVE) LIQD Take 237 mLs by mouth 3 (three) times daily between meals. 10/11/16   [name], MD  fluticasone (FLONASE) 50 MCG/ACT nasal spray Place 2 sprays into both nostrils daily as needed for rhinitis.    [provider]  furosemide (LASIX) 40 MG tablet Take 40 mg by mouth every Tuesday, Thursday, Saturday, and Sunday.     [provider]  HYDROcodone-acetaminophen (NORCO) 5-325 MG tablet Take 1-2 tablets by mouth every 6 (six) hours as needed for moderate pain or severe pain. 05/24/17   Schnier, Dolores Lory, MD  ipratropium-albuterol (DUONEB) 0.5-2.5 (3) MG/3ML SOLN Take 3 mLs  by nebulization every 6 (six) hours as needed. 12/11/16   Nicholes Mango, MD  lidocaine-prilocaine (EMLA) cream Apply 1 application topically as needed (prior to accessing port).    [provider]  losartan (COZAAR) 100 MG tablet Take 100 mg by mouth daily.     [provider]  multivitamin (RENA-VIT) TABS tablet Take 1 tablet by mouth at bedtime.     [provider]  ondansetron (ZOFRAN) 4 MG tablet Take 1 tablet (4 mg total) by mouth every 8 (eight) hours as needed for nausea or vomiting. 03/24/17   Lisa Roca, MD  pantoprazole (PROTONIX) 40 MG tablet Take 40 mg by mouth 2 (two) times daily.    [provider]   senna-docusate (SENOKOT-S) 8.6-50 MG tablet Take 1 tablet by mouth at bedtime as needed for mild constipation. 12/11/16   Nicholes Mango, MD  sertraline (ZOLOFT) 50 MG tablet Take 50 mg by mouth daily.     [provider]  traMADol (ULTRAM) 50 MG tablet Take 1 tablet (50 mg total) by mouth every 12 (twelve) hours as needed for moderate pain. 12/11/16   Nicholes Mango, MD    Allergies  Allergen Reactions  . Chantix [Varenicline]     hallucinations  . Sulfa Antibiotics Itching, Swelling, Rash and Other (See Comments)    Reaction:  Facial/body swelling     Family History  Problem Relation Age of Onset  . Heart disease Mother   . Cancer Father   . Cancer Sister     Social History Social History  Substance Use Topics  . Smoking status: Light Tobacco Smoker    Years: 40.00    Types: Cigarettes  . Smokeless tobacco: Never Used  . Alcohol use No    Review of Systems Constitutional: Negative for fever. Eyes: Negative for visual changes. ENT: Does state some throat pain for the past one week Cardiovascular: Negative for chest pain. Positive for right breast discomfort. Respiratory: Negative for shortness of breath. Gastrointestinal: Negative for abdominal pain Musculoskeletal: Negative for back pain. Skin: Purple discoloration to the right breast Neurological: Negative for headache All other ROS negative  ____________________________________________   PHYSICAL EXAM:  VITAL SIGNS: ED Triage Vitals  Enc Vitals Group     BP 06/05/17 0756 (!) 160/85     Pulse Rate 06/05/17 0756 78     Resp 06/05/17 0756 (!) 24     Temp 06/05/17 0756 98 F (36.7 C)     Temp Source 06/05/17 0756 Oral     SpO2 06/05/17 0756 97 %     Weight 06/05/17 0757 130 lb (59 kg)     Height 06/05/17 0757 5\' 5"  (1.651 m)     Head Circumference --      Peak Flow --      Pain Score 06/05/17 0756 0     Pain Loc --      Pain Edu? --      Excl. in [place]? --     Constitutional: Alert and oriented.  Well appearing and in no distress. Eyes: Normal exam ENT   Head: Normocephalic and atraumatic.   Mouth/Throat: Mucous membranes are moist.Mild pharyngeal erythema with mild uvula edema. No exudate. Normal tympanic membranes. Cardiovascular: Normal rate, regular rhythm. No murmur Respiratory: Normal respiratory effort without tachypnea nor retractions. Breath sounds are clear. The patient does have ecchymosis, mild amount to the right breast most consistent with resolving hematoma/postoperative after right subclavian line insertion. Minimal tenderness to this area. No signs of cellulitis or  abscess. Gastrointestinal: Soft and nontender. No distention.   Musculoskeletal: Nontender with normal range of motion in all extremities.  Neurologic:  Normal speech and language. No gross focal neurologic deficits Skin:  Skin is warm, dry and intact.  Psychiatric: Mood and affect are normal.   ____________________________________________    EKG  EKG reviewed and interpreted by myself shows normal sinus rhythm at 74 bpm, narrow QRS, normal axis, normal intervals, no concerning ST changes noted.  ____________________________________________    RADIOLOGY  Chest x-ray shows mild central venous congestion  ____________________________________________   INITIAL IMPRESSION / ASSESSMENT AND PLAN / ED COURSE  Pertinent labs & imaging results that were available during my care of the patient were reviewed by me and considered in my medical decision making (see chart for details).  The patient presents to the emergency department for right breast discoloration and tenderness. Patient had a right subclavian line inserted last week. The right breast changes are most consistent with postoperative ecchymosis/gravity dependent resolving hematoma. Minimal tenderness to this area. No signs of cellulitis or abscess. Patient also complains on review of systems questioning of a sore throat for the past one  week. On exam she has mild erythema with mild uvula edema we will perform a rapid strep swab. Given the right breast discomfort we will check labs and an EKG as a precaution. Patient did not get dialysis today we will discuss with her dialysis center to see if she can be scheduled for this afternoon.  The patient's labs have resulted largely at her baseline. Slight troponin elevation which appears to be chronic. Chest x-ray shows mild vascular congestion. We will discharge the patient from the emergency department, we will contact the patient's dialysis center in an attempt to get the patient to dialysis today. Reassuringly her potassium is normal at this time.  ____________________________________________   FINAL CLINICAL IMPRESSION(S) / ED DIAGNOSES  Right breast ecchymosis    Harvest Dark, MD 06/05/17 1006

## 2017-06-05 NOTE — ED Notes (Signed)
AAOx3.  Skin warm and dry.  NAD 

## 2017-06-05 NOTE — ED Triage Notes (Addendum)
Pt reports right breast tightness since yesterday, denies any radiation or other complaints. Pt is dialysis patient, also is on 3L chronic oxygen and chronically short of breath. Pt was sent here from dialysis, did not receive treatment today.

## 2017-06-07 LAB — CULTURE, GROUP A STREP (THRC)

## 2017-06-13 ENCOUNTER — Encounter (INDEPENDENT_AMBULATORY_CARE_PROVIDER_SITE_OTHER): Payer: Self-pay

## 2017-06-13 ENCOUNTER — Ambulatory Visit (INDEPENDENT_AMBULATORY_CARE_PROVIDER_SITE_OTHER): Payer: Medicare Other | Admitting: Vascular Surgery

## 2017-06-15 ENCOUNTER — Emergency Department: Payer: Medicare Other

## 2017-06-15 ENCOUNTER — Inpatient Hospital Stay
Admission: EM | Admit: 2017-06-15 | Discharge: 2017-06-20 | DRG: 689 | Disposition: A | Discharge status: Home / Self Care | Payer: Medicare Other | Attending: Internal Medicine | Admitting: Internal Medicine

## 2017-06-15 DIAGNOSIS — N390 Urinary tract infection, site not specified: Secondary | ICD-10-CM | POA: Diagnosis not present

## 2017-06-15 DIAGNOSIS — R531 Weakness: Secondary | ICD-10-CM

## 2017-06-15 DIAGNOSIS — E785 Hyperlipidemia, unspecified: Secondary | ICD-10-CM | POA: Diagnosis present

## 2017-06-15 DIAGNOSIS — I132 Hypertensive heart and chronic kidney disease with heart failure and with stage 5 chronic kidney disease, or end stage renal disease: Secondary | ICD-10-CM | POA: Diagnosis present

## 2017-06-15 DIAGNOSIS — Z992 Dependence on renal dialysis: Secondary | ICD-10-CM

## 2017-06-15 DIAGNOSIS — I10 Essential (primary) hypertension: Secondary | ICD-10-CM | POA: Diagnosis present

## 2017-06-15 DIAGNOSIS — F419 Anxiety disorder, unspecified: Secondary | ICD-10-CM | POA: Diagnosis present

## 2017-06-15 DIAGNOSIS — N2581 Secondary hyperparathyroidism of renal origin: Secondary | ICD-10-CM | POA: Diagnosis present

## 2017-06-15 DIAGNOSIS — R042 Hemoptysis: Secondary | ICD-10-CM | POA: Diagnosis not present

## 2017-06-15 DIAGNOSIS — E1122 Type 2 diabetes mellitus with diabetic chronic kidney disease: Secondary | ICD-10-CM | POA: Diagnosis present

## 2017-06-15 DIAGNOSIS — I5032 Chronic diastolic (congestive) heart failure: Secondary | ICD-10-CM | POA: Diagnosis present

## 2017-06-15 DIAGNOSIS — F17210 Nicotine dependence, cigarettes, uncomplicated: Secondary | ICD-10-CM | POA: Diagnosis present

## 2017-06-15 DIAGNOSIS — F329 Major depressive disorder, single episode, unspecified: Secondary | ICD-10-CM | POA: Diagnosis present

## 2017-06-15 DIAGNOSIS — I509 Heart failure, unspecified: Secondary | ICD-10-CM

## 2017-06-15 DIAGNOSIS — R42 Dizziness and giddiness: Secondary | ICD-10-CM

## 2017-06-15 DIAGNOSIS — Z9981 Dependence on supplemental oxygen: Secondary | ICD-10-CM

## 2017-06-15 DIAGNOSIS — N186 End stage renal disease: Secondary | ICD-10-CM | POA: Diagnosis present

## 2017-06-15 DIAGNOSIS — K219 Gastro-esophageal reflux disease without esophagitis: Secondary | ICD-10-CM | POA: Diagnosis present

## 2017-06-15 DIAGNOSIS — W19XXXA Unspecified fall, initial encounter: Secondary | ICD-10-CM

## 2017-06-15 DIAGNOSIS — J9621 Acute and chronic respiratory failure with hypoxia: Secondary | ICD-10-CM

## 2017-06-15 DIAGNOSIS — M1990 Unspecified osteoarthritis, unspecified site: Secondary | ICD-10-CM | POA: Diagnosis present

## 2017-06-15 DIAGNOSIS — J449 Chronic obstructive pulmonary disease, unspecified: Secondary | ICD-10-CM | POA: Diagnosis present

## 2017-06-15 DIAGNOSIS — G4733 Obstructive sleep apnea (adult) (pediatric): Secondary | ICD-10-CM | POA: Diagnosis present

## 2017-06-15 DIAGNOSIS — D631 Anemia in chronic kidney disease: Secondary | ICD-10-CM | POA: Diagnosis present

## 2017-06-15 DIAGNOSIS — I700 Atherosclerosis of aorta: Secondary | ICD-10-CM | POA: Diagnosis present

## 2017-06-15 DIAGNOSIS — Z79899 Other long term (current) drug therapy: Secondary | ICD-10-CM

## 2017-06-15 DIAGNOSIS — J069 Acute upper respiratory infection, unspecified: Secondary | ICD-10-CM | POA: Diagnosis present

## 2017-06-15 LAB — BASIC METABOLIC PANEL
ANION GAP: 14 (ref 5–15)
BUN: 78 mg/dL — ABNORMAL HIGH (ref 6–20)
CALCIUM: 7.3 mg/dL — AB (ref 8.9–10.3)
CO2: 26 mmol/L (ref 22–32)
CREATININE: 9.89 mg/dL — AB (ref 0.44–1.00)
Chloride: 99 mmol/L — ABNORMAL LOW (ref 101–111)
GFR, EST AFRICAN AMERICAN: 4 mL/min — AB (ref >60)
GFR, EST NON AFRICAN AMERICAN: 4 mL/min — AB (ref >60)
GLUCOSE: 260 mg/dL — AB (ref 65–99)
Potassium: 5.4 mmol/L — ABNORMAL HIGH (ref 3.5–5.1)
Sodium: 139 mmol/L (ref 135–145)

## 2017-06-15 LAB — CBC
HCT: 26.8 % — ABNORMAL LOW (ref 35.0–47.0)
Hemoglobin: 8.8 g/dL — ABNORMAL LOW (ref 12.0–16.0)
MCH: 34 pg (ref 26.0–34.0)
MCHC: 32.9 g/dL (ref 32.0–36.0)
MCV: 103.4 fL — ABNORMAL HIGH (ref 80.0–100.0)
PLATELETS: 284 10*3/uL (ref 150–440)
RBC: 2.59 MIL/uL — ABNORMAL LOW (ref 3.80–5.20)
RDW: 14.6 % — ABNORMAL HIGH (ref 11.5–14.5)
WBC: 8.1 10*3/uL (ref 3.6–11.0)

## 2017-06-15 MED ORDER — IPRATROPIUM-ALBUTEROL 0.5-2.5 (3) MG/3ML IN SOLN
3.0000 mL | Freq: Four times a day (QID) | RESPIRATORY_TRACT | Status: DC
  Administered 2017-06-15 – 2017-06-20 (×13): 3 mL via RESPIRATORY_TRACT
  Filled 2017-06-15 (×17): qty 3

## 2017-06-15 MED ORDER — ONDANSETRON HCL 4 MG/2ML IJ SOLN
4.0000 mg | Freq: Four times a day (QID) | INTRAMUSCULAR | Status: DC | PRN
  Administered 2017-06-16: 4 mg via INTRAVENOUS
  Filled 2017-06-15: qty 2

## 2017-06-15 MED ORDER — SERTRALINE HCL 50 MG PO TABS
50.0000 mg | ORAL_TABLET | Freq: Every day | ORAL | Status: DC
  Administered 2017-06-15 – 2017-06-19 (×4): 50 mg via ORAL
  Filled 2017-06-15 (×4): qty 1

## 2017-06-15 MED ORDER — AMLODIPINE BESYLATE 10 MG PO TABS
10.0000 mg | ORAL_TABLET | Freq: Every day | ORAL | Status: DC
  Administered 2017-06-15 – 2017-06-19 (×3): 10 mg via ORAL
  Filled 2017-06-15 (×4): qty 1

## 2017-06-15 MED ORDER — HEPARIN SODIUM (PORCINE) 5000 UNIT/ML IJ SOLN
5000.0000 [IU] | Freq: Three times a day (TID) | INTRAMUSCULAR | Status: DC
  Administered 2017-06-16: 5000 [IU] via SUBCUTANEOUS
  Filled 2017-06-15 (×3): qty 1

## 2017-06-15 MED ORDER — PRO-STAT SUGAR FREE PO LIQD
30.0000 mL | Freq: Every day | ORAL | Status: DC
  Administered 2017-06-16 – 2017-06-19 (×3): 30 mL via ORAL

## 2017-06-15 MED ORDER — ACETAMINOPHEN 325 MG PO TABS
650.0000 mg | ORAL_TABLET | Freq: Four times a day (QID) | ORAL | Status: DC | PRN
  Administered 2017-06-17 – 2017-06-19 (×4): 650 mg via ORAL
  Filled 2017-06-15 (×5): qty 2

## 2017-06-15 MED ORDER — FLUTICASONE PROPIONATE 50 MCG/ACT NA SUSP
2.0000 | Freq: Every day | NASAL | Status: DC | PRN
  Administered 2017-06-16 – 2017-06-20 (×3): 2 via NASAL
  Filled 2017-06-15 (×2): qty 16

## 2017-06-15 MED ORDER — BISACODYL 10 MG RE SUPP: 10.0000 mg | Freq: Every day | RECTAL | Status: DC | PRN

## 2017-06-15 MED ORDER — ALBUTEROL SULFATE (2.5 MG/3ML) 0.083% IN NEBU: 2.5000 mg | INHALATION_SOLUTION | Freq: Four times a day (QID) | RESPIRATORY_TRACT | Status: DC | PRN

## 2017-06-15 MED ORDER — ONDANSETRON HCL 4 MG PO TABS: 4.0000 mg | ORAL_TABLET | Freq: Four times a day (QID) | ORAL | Status: DC | PRN

## 2017-06-15 MED ORDER — PANTOPRAZOLE SODIUM 40 MG PO TBEC
40.0000 mg | DELAYED_RELEASE_TABLET | Freq: Two times a day (BID) | ORAL | Status: DC
Start: 2017-06-15 — End: 2017-06-20
  Administered 2017-06-15 – 2017-06-19 (×8): 40 mg via ORAL
  Filled 2017-06-15 (×8): qty 1

## 2017-06-15 MED ORDER — HYDRALAZINE HCL 20 MG/ML IJ SOLN
10.0000 mg | Freq: Four times a day (QID) | INTRAMUSCULAR | Status: DC | PRN
  Administered 2017-06-15 – 2017-06-17 (×3): 10 mg via INTRAVENOUS
  Filled 2017-06-15 (×6): qty 1

## 2017-06-15 MED ORDER — FUROSEMIDE 40 MG PO TABS
40.0000 mg | ORAL_TABLET | ORAL | Status: DC
  Administered 2017-06-15 – 2017-06-18 (×2): 40 mg via ORAL
  Filled 2017-06-15 (×2): qty 1

## 2017-06-15 MED ORDER — HYDROCODONE-ACETAMINOPHEN 5-325 MG PO TABS
1.0000 | ORAL_TABLET | Freq: Four times a day (QID) | ORAL | Status: DC | PRN
  Administered 2017-06-16: 2 via ORAL
  Administered 2017-06-16 (×2): 1 via ORAL
  Administered 2017-06-17: 2 via ORAL
  Filled 2017-06-15: qty 2
  Filled 2017-06-15 (×2): qty 1
  Filled 2017-06-15: qty 2

## 2017-06-15 MED ORDER — ALBUTEROL SULFATE HFA 108 (90 BASE) MCG/ACT IN AERS: 2.0000 | INHALATION_SPRAY | Freq: Four times a day (QID) | RESPIRATORY_TRACT | Status: DC | PRN

## 2017-06-15 MED ORDER — EZETIMIBE 10 MG PO TABS
10.0000 mg | ORAL_TABLET | Freq: Every day | ORAL | Status: DC
  Administered 2017-06-15 – 2017-06-19 (×4): 10 mg via ORAL
  Filled 2017-06-15 (×5): qty 1

## 2017-06-15 MED ORDER — CALCIUM ACETATE (PHOS BINDER) 667 MG PO CAPS
1334.0000 mg | ORAL_CAPSULE | Freq: Three times a day (TID) | ORAL | Status: DC
  Administered 2017-06-16 – 2017-06-17 (×3): 1334 mg via ORAL
  Filled 2017-06-15 (×3): qty 2

## 2017-06-15 MED ORDER — LOSARTAN POTASSIUM 50 MG PO TABS
100.0000 mg | ORAL_TABLET | Freq: Every day | ORAL | Status: DC
  Administered 2017-06-15 – 2017-06-19 (×3): 100 mg via ORAL
  Filled 2017-06-15 (×3): qty 2

## 2017-06-15 MED ORDER — ACETAMINOPHEN 650 MG RE SUPP: 650.0000 mg | Freq: Four times a day (QID) | RECTAL | Status: DC | PRN

## 2017-06-15 MED ORDER — SENNOSIDES-DOCUSATE SODIUM 8.6-50 MG PO TABS: 1.0000 | ORAL_TABLET | Freq: Every evening | ORAL | Status: DC | PRN

## 2017-06-15 MED ORDER — CALCIUM ACETATE (PHOS BINDER) 667 MG PO CAPS: 667.0000 mg | ORAL_CAPSULE | Freq: Every evening | ORAL | Status: DC | PRN

## 2017-06-15 MED ORDER — CARVEDILOL 12.5 MG PO TABS
12.5000 mg | ORAL_TABLET | Freq: Two times a day (BID) | ORAL | Status: DC
  Administered 2017-06-16 – 2017-06-20 (×8): 12.5 mg via ORAL
  Filled 2017-06-15 (×8): qty 1

## 2017-06-15 MED ORDER — SODIUM POLYSTYRENE SULFONATE 15 GM/60ML PO SUSP: 30.0000 g | Freq: Once | ORAL | Status: DC

## 2017-06-15 MED ORDER — PATIROMER SORBITEX CALCIUM 8.4 G PO PACK
8.4000 g | PACK | Freq: Every day | ORAL | Status: DC
  Administered 2017-06-15: 8.4 g via ORAL
  Filled 2017-06-15 (×3): qty 4

## 2017-06-15 MED ORDER — ALPRAZOLAM 0.5 MG PO TABS
0.5000 mg | ORAL_TABLET | Freq: Two times a day (BID) | ORAL | Status: DC
  Administered 2017-06-15 – 2017-06-17 (×3): 0.5 mg via ORAL
  Filled 2017-06-15 (×3): qty 1

## 2017-06-15 MED ORDER — RENA-VITE PO TABS
1.0000 | ORAL_TABLET | Freq: Every day | ORAL | Status: DC
  Administered 2017-06-15 – 2017-06-19 (×5): 1 via ORAL
  Filled 2017-06-15 (×6): qty 1

## 2017-06-15 MED ORDER — SODIUM CHLORIDE 0.9 % IV SOLN
INTRAVENOUS | Status: DC
  Administered 2017-06-15: 18:00:00 via INTRAVENOUS

## 2017-06-15 NOTE — ED Triage Notes (Signed)
Pt came to ED via pov c/o falling this morning. Denies hitting head or loc. Reports feeling lightdeaded. Pt went to dialysis and they sent her over to ED without being dialyzed due to fall.

## 2017-06-15 NOTE — Progress Notes (Signed)
Primary Nurse notified Dr. Doy Hutching of pt increase BP along with IV fluids orders at 75 ml an hour. Orders received to decrease IV fluids to 20 ml an hour along with Hydralizine 10 mg IV q 6 hr PRN

## 2017-06-15 NOTE — ED Provider Notes (Signed)
[facility] Emergency Department Provider Note  ____________________________________________  Time seen: Approximately 3:57 PM  I have reviewed the triage vital signs and the nursing notes.   HISTORY  Chief Complaint Fall and Weakness    HPI Michele Nash is a 61 y.o. female sent to the ED from dialysis for evaluation due to a fall this morning. Dialysis was not performed prior to sending her to the ED. Patient reports that he was essentially in her usual state of health, and after using the bathroom she was leaning over trying to pull her pants up when she fell to the right. She thinks she lost her balance and did not pass out but she has poor recollection of the event. She does not think she hit her head but she does have a bilateral headache which she describes as acute on chronic. Denies any focal weakness or paresthesias. No neck pain or vision changes.  Has chronic shortness of breath and cough which she feels are at baseline. She does get very short of breath laying flat and feels more comfortable sitting upright. She has frequent wake ups at night despite using her CPAP machine.  She's been on dialysis for 4 years, but still produces urine. She denies any changes such as decreased urine output pain or frequency. Last had dialysis on Thursday 2 days ago.     Past Medical History:  Diagnosis Date  . Altered mental status   . Anemia   . Apnea, sleep    for sleep study 10/17/15-no cpap yet  . Arthritis   . Bronchitis   . Chronic kidney disease   . COPD (chronic obstructive pulmonary disease) ([place])   . Depression   . Dialysis patient ([place]) 2014  . GERD (gastroesophageal reflux disease)   . GIB (gastrointestinal bleeding) 07/09/2016  . Headache   . Hyperlipidemia   . Hypertension   . Obesity   . Pulmonary edema 09/14/2016  . Renal dialysis device, implant, or graft complication    RIGHT CHEST CATH  . Respiratory failure ([place]) 07/23/2016  .  Traumatic hematoma of forehead   . Type II diabetes mellitus ([place]) 09/21/2009   diet controlled     Patient Active Problem List   Diagnosis Date Noted  . Dizziness 06/15/2017  . Chronic diastolic CHF (congestive heart failure) ([place]) 06/15/2017  . Hyperkalemia 05/21/2017  . Respiratory failure, acute and chronic ([place]) 12/04/2016  . Multiple closed fractures of ribs of right side   . Hyperlipidemia 11/28/2016  . COPD exacerbation ([place]) 10/29/2016  . Altered mental status   . Fall   . Traumatic hematoma of forehead   . Acute respiratory failure with hypercapnia ([place]) 10/06/2016  . Pulmonary edema 09/14/2016  . Respiratory failure ([place]) 07/23/2016  . GIB (gastrointestinal bleeding) 07/09/2016  . Protein-calorie malnutrition, severe 05/29/2016  . Hyperglycemia 05/28/2016  . Pain in limb 10/28/2015  . ESRD on dialysis ([place]) 10/28/2015  . Essential hypertension 10/28/2015  . ESRD on hemodialysis ([place]) 03/29/2015  . HTN (hypertension) 03/29/2015  . COPD (chronic obstructive pulmonary disease) ([place]) 03/29/2015  . GAD (generalized anxiety disorder) 03/29/2015  . Chronic kidney disease 10/18/2009  . Personal history of tobacco use, presenting hazards to health 10/18/2009  . Type II diabetes mellitus ([place]) 09/21/2009  . Disorder of uterus 02/14/1990     Past Surgical History:  Procedure Laterality Date  . ABDOMINAL HYSTERECTOMY    . COLONOSCOPY  2011  . COLONOSCOPY WITH PROPOFOL N/A 09/20/2015   Procedure: COLONOSCOPY WITH  PROPOFOL;  Surgeon: Lucilla Lame, MD;  Location: [facility] ENDOSCOPY;  Service: Endoscopy;  Laterality: N/A;  . DIALYSIS FISTULA CREATION    . ESOPHAGOGASTRODUODENOSCOPY N/A 07/13/2016   Procedure: ESOPHAGOGASTRODUODENOSCOPY (EGD);  Surgeon: Lollie Sails, MD;  Location: [facility] ENDOSCOPY;  Service: Endoscopy;  Laterality: N/A;  . ESOPHAGOGASTRODUODENOSCOPY (EGD) WITH PROPOFOL N/A 06/02/2016   Procedure: ESOPHAGOGASTRODUODENOSCOPY (EGD) WITH PROPOFOL;  Surgeon: Manya Silvas, MD;  Location: [facility] ENDOSCOPY;  Service: Endoscopy;  Laterality: N/A;  . LIGATION OF ARTERIOVENOUS  FISTULA Left 05/24/2017   Procedure: LIGATION OF ARTERIOVENOUS  FISTULA ( PERMCATH INSERTION );  Surgeon: Katha Cabal, MD;  Location: [facility] ORS;  Service: Vascular;  Laterality: Left;  . PERIPHERAL VASCULAR CATHETERIZATION N/A 05/10/2015   Procedure: A/V Shuntogram/Fistulagram;  Surgeon: Katha Cabal, MD;  Location: [place] CV LAB;  Service: Cardiovascular;  Laterality: N/A;  . PERIPHERAL VASCULAR CATHETERIZATION Left 05/10/2015   Procedure: A/V Shunt Intervention;  Surgeon: Katha Cabal, MD;  Location: [place] CV LAB;  Service: Cardiovascular;  Laterality: Left;  . PERIPHERAL VASCULAR CATHETERIZATION Left 08/23/2015   Procedure: A/V Shuntogram/Fistulagram;  Surgeon: Katha Cabal, MD;  Location: [place] CV LAB;  Service: Cardiovascular;  Laterality: Left;  . PERIPHERAL VASCULAR CATHETERIZATION N/A 08/23/2015   Procedure: A/V Shunt Intervention;  Surgeon: Katha Cabal, MD;  Location: [place] CV LAB;  Service: Cardiovascular;  Laterality: N/A;  . PERIPHERAL VASCULAR CATHETERIZATION  08/23/2015   Procedure: Dialysis/Perma Catheter Insertion;  Surgeon: Katha Cabal, MD;  Location: [place] CV LAB;  Service: Cardiovascular;;  . PERIPHERAL VASCULAR CATHETERIZATION N/A 01/03/2016   Procedure: Dialysis/Perma Catheter Removal;  Surgeon: Katha Cabal, MD;  Location: [place] CV LAB;  Service: Cardiovascular;  Laterality: N/A;  . REVISON OF ARTERIOVENOUS FISTULA Left 10/28/2015   Procedure: REVISON OF ARTERIOVENOUS FISTULA WITH ARTEGRAFT;  Surgeon: Katha Cabal, MD;  Location: [facility] ORS;  Service: Vascular;  Laterality: Left;  . WOUND DEBRIDEMENT Left 10/28/2015   Procedure: Resection of shoulder cyst ( left );  Surgeon: Katha Cabal, MD;  Location: [facility] ORS;  Service: Vascular;  Laterality: Left;     Prior to Admission  medications   Medication Sig Start Date End Date Taking? Authorizing Provider  acetaminophen (TYLENOL) 325 MG tablet Take 2 tablets (650 mg total) by mouth every 6 (six) hours as needed for mild pain (or Fever >/= 101). 12/11/16   Gouru, Illene Silver, MD  albuterol (PROVENTIL HFA;VENTOLIN HFA) 108 (90 Base) MCG/ACT inhaler Inhale 2 puffs into the lungs every 6 (six) hours as needed for wheezing or shortness of breath. 12/11/16   Gouru, Illene Silver, MD  ALPRAZolam Duanne Moron) 0.5 MG tablet Take 0.5 mg by mouth 2 (two) times daily.    [provider]  Amino Acids-Protein Hydrolys (FEEDING SUPPLEMENT, PRO-STAT SUGAR FREE 64,) LIQD Take 30 mLs by mouth daily. 12/12/16   Gouru, Illene Silver, MD  amLODipine (NORVASC) 10 MG tablet Take 10 mg by mouth daily.     [provider]  calcium acetate (PHOSLO) 667 MG capsule Take 1,334 mg by mouth 3 (three) times daily with meals. 1 tablet with bedtime snack.    [provider]  carvedilol (COREG) 12.5 MG tablet Take 12.5 mg by mouth 2 (two) times daily with a meal.    [provider]  ezetimibe (ZETIA) 10 MG tablet Take 10 mg by mouth daily.     [provider]  feeding supplement, ENSURE ENLIVE, (ENSURE ENLIVE) LIQD Take 237 mLs by mouth 3 (  three) times daily between meals. 10/11/16   [name], MD  fluticasone (FLONASE) 50 MCG/ACT nasal spray Place 2 sprays into both nostrils daily as needed for rhinitis.    [provider]  furosemide (LASIX) 40 MG tablet Take 40 mg by mouth every Tuesday, Thursday, Saturday, and Sunday.     [provider]  HYDROcodone-acetaminophen (NORCO) 5-325 MG tablet Take 1-2 tablets by mouth every 6 (six) hours as needed for moderate pain or severe pain. 05/24/17   Schnier, Dolores Lory, MD  ipratropium-albuterol (DUONEB) 0.5-2.5 (3) MG/3ML SOLN Take 3 mLs by nebulization every 6 (six) hours as needed. 12/11/16   Nicholes Mango, MD  lidocaine-prilocaine (EMLA) cream Apply 1 application topically as  needed (prior to accessing port).    [provider]  losartan (COZAAR) 100 MG tablet Take 100 mg by mouth daily.     [provider]  multivitamin (RENA-VIT) TABS tablet Take 1 tablet by mouth at bedtime.     [provider]  ondansetron (ZOFRAN) 4 MG tablet Take 1 tablet (4 mg total) by mouth every 8 (eight) hours as needed for nausea or vomiting. 03/24/17   Lisa Roca, MD  pantoprazole (PROTONIX) 40 MG tablet Take 40 mg by mouth 2 (two) times daily.    [provider]  senna-docusate (SENOKOT-S) 8.6-50 MG tablet Take 1 tablet by mouth at bedtime as needed for mild constipation. 12/11/16   Nicholes Mango, MD  sertraline (ZOLOFT) 50 MG tablet Take 50 mg by mouth daily.     [provider]  traMADol (ULTRAM) 50 MG tablet Take 1 tablet (50 mg total) by mouth every 12 (twelve) hours as needed for moderate pain. 12/11/16   Nicholes Mango, MD     Allergies Chantix [varenicline] and Sulfa antibiotics   Family History  Problem Relation Age of Onset  . Heart disease Mother   . Cancer Father   . Cancer Sister     Social History Social History  Substance Use Topics  . Smoking status: Light Tobacco Smoker    Years: 40.00    Types: Cigarettes  . Smokeless tobacco: Never Used  . Alcohol use No    Review of Systems  Constitutional:   No fever or chills.  ENT:   No sore throat. No rhinorrhea.Positive bilateral ear pain for several months Cardiovascular:   No chest pain or syncope. Respiratory:   Chronic shortness of breath and nonproductive cough, unchanged from baseline. Gastrointestinal:   Negative for abdominal pain, vomiting and diarrhea.  Musculoskeletal:   Negative for focal pain or swelling All other systems reviewed and are negative except as documented above in ROS and HPI.  ____________________________________________   PHYSICAL EXAM:  VITAL SIGNS: ED Triage Vitals  Enc Vitals Group     BP 06/15/17 1126 (!) 174/90     Pulse Rate  06/15/17 1126 81     Resp 06/15/17 1126 18     Temp 06/15/17 1126 98.3 F (36.8 C)     Temp Source 06/15/17 1126 Oral     SpO2 06/15/17 1126 100 %     Weight 06/15/17 1123 130 lb (59 kg)     Height 06/15/17 1123 5\' 5"  (1.651 m)     Head Circumference --      Peak Flow --      Pain Score --      Pain Loc --      Pain Edu? --      Excl. in GC? --   83%  on room air. Patient uses 3 L nasal cannula at home at all times. 98-100% on 3 L nasal cannula.  Vital signs reviewed, nursing assessments reviewed.   Constitutional:   Alert and oriented. Not in distress Eyes:   No scleral icterus.  EOMI. No nystagmus. No conjunctival pallor. PERRL. ENT   Head:   Normocephalic and atraumatic. Bilateral mild otitis externa with injected left tympanic membrane   Nose:   No congestion/rhinnorhea.    Mouth/Throat:   MMM, no pharyngeal erythema. No peritonsillar mass.    Neck:   No meningismus. Full ROM. Significantly distended neck veins while sitting upright Hematological/Lymphatic/Immunilogical:   No cervical lymphadenopathy. Cardiovascular:   RRR. Symmetric bilateral radial and DP pulses.  No murmurs.  Respiratory:   Normal respiratory effort without tachypnea/retractions. Diminished air entry in all lung fields, bibasilar crackles. No wheezing. Normal expiratory phase. Breath sounds are present and symmetric in all fields. Gastrointestinal:   Soft and nontender. Non distended. There is no CVA tenderness.  No rebound, rigidity, or guarding. Genitourinary:   deferred Musculoskeletal:   Normal range of motion in all extremities. No joint effusions.  No lower extremity tenderness.  No edema. Neurologic:   Normal speech and language.  Motor grossly intact. Normal finger to nose. No pronator drift. No gross focal neurologic deficits are appreciated.  Skin:    Skin is warm, dry and intact. No rash noted.  No petechiae, purpura, or bullae.  ____________________________________________     LABS (pertinent positives/negatives) (all labs ordered are listed, but only abnormal results are displayed) Labs Reviewed  BASIC METABOLIC PANEL - Abnormal; Notable for the following:       Result Value   Potassium 5.4 (*)    Chloride 99 (*)    Glucose, Bld 260 (*)    BUN 78 (*)    Creatinine, Ser 9.89 (*)    Calcium 7.3 (*)    GFR calc non Af Amer 4 (*)    GFR calc Af Amer 4 (*)    All other components within normal limits  CBC - Abnormal; Notable for the following:    RBC 2.59 (*)    Hemoglobin 8.8 (*)    HCT 26.8 (*)    MCV 103.4 (*)    RDW 14.6 (*)    All other components within normal limits  URINALYSIS, COMPLETE (UACMP) WITH MICROSCOPIC  CBG MONITORING, ED   ____________________________________________   EKG  Interpreted by me Normal sinus rhythm rate of 73, normal axis and intervals. Poor R-wave progression in anterior precordial leads. Normal ST segments and T waves. No signs of electrical instability.  ____________________________________________    RADIOLOGY  Dg Chest 2 View  Result Date: 06/15/2017 CLINICAL DATA:  COPD, end-stage renal disease, fell this morning, lightheaded smoker, hypertension EXAM: CHEST  2 VIEW COMPARISON:  06/05/2017 FINDINGS: RIGHT jugular central venous catheter with tip projecting over SVC. Enlargement of cardiac silhouette with pulmonary vascular congestion. Tortuous aorta with atherosclerotic calcification. Diffuse interstitial infiltrates likely pulmonary edema, question CHF versus fluid overload. Subsegmental atelectasis RIGHT base. No pleural effusion or pneumothorax. Bones demineralized. IMPRESSION: Enlargement of cardiac silhouette with pulmonary vascular congestion and diffuse interstitial edema question fluid overload versus CHF. Aortic Atherosclerosis (ICD10-I70.0). Electronically Signed   By: [name] M.D.   On: 06/15/2017 16:39   Ct Head Wo Contrast  Result Date: 06/15/2017 CLINICAL DATA:  [place] this morning, lightheaded  EXAM: CT HEAD WITHOUT CONTRAST TECHNIQUE: Contiguous axial images were obtained from the base of the skull through  the vertex without intravenous contrast. COMPARISON:  12/04/2016, 10/06/2016 MRI FINDINGS: Brain: No acute territorial infarction, hemorrhage, or intracranial mass is seen. Minimal atrophy. Mild hypodensity in the periventricular white matter consistent with small vessel ischemic changes. Nonenlarged ventricles. Vascular: No hyperdense vessels. Carotid artery and vertebral artery calcifications. Skull: No fracture or suspicious bone lesion. Sinuses/Orbits: No acute finding. Other: None IMPRESSION: No CT evidence for acute intracranial abnormality. Electronically Signed   By: Donavan Foil M.D.   On: 06/15/2017 16:32    ____________________________________________   PROCEDURES Procedures  ____________________________________________   INITIAL IMPRESSION / ASSESSMENT AND PLAN / ED COURSE  Pertinent labs & imaging results that were available during my care of the patient were reviewed by me and considered in my medical decision making (see chart for details).  Patient presents after fall, mechanical from tripping over clothing or possibly neurologic. Patient has poor recollection of the event. Labs nonacute today. Check urinalysis chest x-ray CT head. Check stability with ambulation, will reassess.  Clinical Course as of Jun 15 1738  Sat Jun 15, 2017  1657 Cxr c/w volume overload. Sx and exam c/w volume overload with pulm edema/chf as well.  Considering tomorrow is Sunday and outpt dialysis not possible for 2 days, pt will need to receive dialysis in hospital. Will d/w nephrology.  [PS]    Clinical Course User Index [PS] Carrie Mew, MD     ____________________________________________   FINAL CLINICAL IMPRESSION(S) / ED DIAGNOSES  Final diagnoses:  Generalized weakness  Fall, initial encounter  Acute on chronic congestive heart failure, unspecified heart failure  type ([place])  Acute on chronic respiratory failure with hypoxia ([place])      New Prescriptions   No medications on file     Portions of this note were generated with dragon dictation software. Dictation errors may occur despite best attempts at proofreading.    Carrie Mew, MD 06/15/17 1740

## 2017-06-15 NOTE — H&P (Signed)
History and Physical    Michele Nash QIW:[id number] DOB: Sep 26, 1956 DOA: 06/15/2017  Referring physician: Dr. Joni Fears PCP: [name], MD  Specialists: none  Chief Complaint: dizziness  HPI: Michele Nash is a 61 y.o. female has a past medical history significant for ESRD on HD with chronic HCF who fell at dialysis earlier today and was sent to ER for evaluation without getting her HD. In ER, w/u negative except pt slightly volume overloaded, in need of HD. She is now admitted. No fever. VSS. Denies CP but does c/o SOB. Head CT OK  Review of Systems: The patient denies anorexia, fever, weight loss,, vision loss, decreased hearing, hoarseness, chest pain, syncope, balance deficits, hemoptysis, abdominal pain, melena, hematochezia, severe indigestion/heartburn, hematuria, incontinence, genital sores, muscle weakness, suspicious skin lesions, transient blindness, difficulty walking, depression, unusual weight change, abnormal bleeding, enlarged lymph nodes, angioedema, and breast masses.   Past Medical History:  Diagnosis Date  . Altered mental status   . Anemia   . Apnea, sleep    for sleep study 10/17/15-no cpap yet  . Arthritis   . Bronchitis   . Chronic kidney disease   . COPD (chronic obstructive pulmonary disease) ([place])   . Depression   . Dialysis patient ([place]) 2014  . GERD (gastroesophageal reflux disease)   . GIB (gastrointestinal bleeding) 07/09/2016  . Headache   . Hyperlipidemia   . Hypertension   . Obesity   . Pulmonary edema 09/14/2016  . Renal dialysis device, implant, or graft complication    RIGHT CHEST CATH  . Respiratory failure ([place]) 07/23/2016  . Traumatic hematoma of forehead   . Type II diabetes mellitus ([place]) 09/21/2009   diet controlled   Past Surgical History:  Procedure Laterality Date  . ABDOMINAL HYSTERECTOMY    . COLONOSCOPY  2011  . COLONOSCOPY WITH PROPOFOL N/A 09/20/2015   Procedure: COLONOSCOPY WITH PROPOFOL;  Surgeon: Lucilla Lame,  MD;  Location: [facility] ENDOSCOPY;  Service: Endoscopy;  Laterality: N/A;  . DIALYSIS FISTULA CREATION    . ESOPHAGOGASTRODUODENOSCOPY N/A 07/13/2016   Procedure: ESOPHAGOGASTRODUODENOSCOPY (EGD);  Surgeon: Lollie Sails, MD;  Location[facility] ENDOSCOPY;  Service: Endoscopy;  Laterality: N/A;  . ESOPHAGOGASTRODUODENOSCOPY (EGD) WITH PROPOFOL N/A 06/02/2016   Procedure: ESOPHAGOGASTRODUODENOSCOPY (EGD) WITH PROPOFOL;  Surgeon: Manya Silvas, MD;  Location: [facility] ENDOSCOPY;  Service: Endoscopy;  Laterality: N/A;  . LIGATION OF ARTERIOVENOUS  FISTULA Left 05/24/2017   Procedure: LIGATION OF ARTERIOVENOUS  FISTULA ( PERMCATH INSERTION );  Surgeon: Katha Cabal, MD;  Location: [facility] ORS;  Service: Vascular;  Laterality: Left;  . PERIPHERAL VASCULAR CATHETERIZATION N/A 05/10/2015   Procedure: A/V Shuntogram/Fistulagram;  Surgeon: Katha Cabal, MD;  Location: [place] CV LAB;  Service: Cardiovascular;  Laterality: N/A;  . PERIPHERAL VASCULAR CATHETERIZATION Left 05/10/2015   Procedure: A/V Shunt Intervention;  Surgeon: Katha Cabal, MD;  Location: [place] CV LAB;  Service: Cardiovascular;  Laterality: Left;  . PERIPHERAL VASCULAR CATHETERIZATION Left 08/23/2015   Procedure: A/V Shuntogram/Fistulagram;  Surgeon: Katha Cabal, MD;  Location: [place] CV LAB;  Service: Cardiovascular;  Laterality: Left;  . PERIPHERAL VASCULAR CATHETERIZATION N/A 08/23/2015   Procedure: A/V Shunt Intervention;  Surgeon: Katha Cabal, MD;  Location: [place] CV LAB;  Service: Cardiovascular;  Laterality: N/A;  . PERIPHERAL VASCULAR CATHETERIZATION  08/23/2015   Procedure: Dialysis/Perma Catheter Insertion;  Surgeon: Katha Cabal, MD;  Location: [place] CV LAB;  Service: Cardiovascular;;  . PERIPHERAL VASCULAR CATHETERIZATION N/A 01/03/2016  Procedure: Dialysis/Perma Catheter Removal;  Surgeon: Katha Cabal, MD;  Location: [place] CV LAB;  Service: Cardiovascular;   Laterality: N/A;  . REVISON OF ARTERIOVENOUS FISTULA Left 10/28/2015   Procedure: REVISON OF ARTERIOVENOUS FISTULA WITH ARTEGRAFT;  Surgeon: Katha Cabal, MD;  Location: [facility] ORS;  Service: Vascular;  Laterality: Left;  . WOUND DEBRIDEMENT Left 10/28/2015   Procedure: Resection of shoulder cyst ( left );  Surgeon: Katha Cabal, MD;  Location: [facility] ORS;  Service: Vascular;  Laterality: Left;   Social History:  reports that she has been smoking Cigarettes.  She has smoked for the past 40.00 years. She has never used smokeless tobacco. She reports that she does not drink alcohol or use drugs.  Allergies  Allergen Reactions  . Chantix [Varenicline]     hallucinations  . Sulfa Antibiotics Itching, Swelling, Rash and Other (See Comments)    Reaction:  Facial/body swelling     Family History  Problem Relation Age of Onset  . Heart disease Mother   . Cancer Father   . Cancer Sister     Prior to Admission medications   Medication Sig Start Date End Date Taking? Authorizing Provider  acetaminophen (TYLENOL) 325 MG tablet Take 2 tablets (650 mg total) by mouth every 6 (six) hours as needed for mild pain (or Fever >/= 101). 12/11/16   Gouru, Illene Silver, MD  albuterol (PROVENTIL HFA;VENTOLIN HFA) 108 (90 Base) MCG/ACT inhaler Inhale 2 puffs into the lungs every 6 (six) hours as needed for wheezing or shortness of breath. 12/11/16   Gouru, Illene Silver, MD  ALPRAZolam Duanne Moron) 0.5 MG tablet Take 0.5 mg by mouth 2 (two) times daily.    [provider]  Amino Acids-Protein Hydrolys (FEEDING SUPPLEMENT, PRO-STAT SUGAR FREE 64,) LIQD Take 30 mLs by mouth daily. 12/12/16   Gouru, Illene Silver, MD  amLODipine (NORVASC) 10 MG tablet Take 10 mg by mouth daily.     [provider]  calcium acetate (PHOSLO) 667 MG capsule Take 1,334 mg by mouth 3 (three) times daily with meals. 1 tablet with bedtime snack.    [provider]  carvedilol (COREG) 12.5 MG tablet Take 12.5 mg by mouth 2 (two) times  daily with a meal.    [provider]  ezetimibe (ZETIA) 10 MG tablet Take 10 mg by mouth daily.     [provider]  feeding supplement, ENSURE ENLIVE, (ENSURE ENLIVE) LIQD Take 237 mLs by mouth 3 (three) times daily between meals. 10/11/16   [name], MD  fluticasone (FLONASE) 50 MCG/ACT nasal spray Place 2 sprays into both nostrils daily as needed for rhinitis.    [provider]  furosemide (LASIX) 40 MG tablet Take 40 mg by mouth every Tuesday, Thursday, Saturday, and Sunday.     [provider]  HYDROcodone-acetaminophen (NORCO) 5-325 MG tablet Take 1-2 tablets by mouth every 6 (six) hours as needed for moderate pain or severe pain. 05/24/17   Schnier, Dolores Lory, MD  ipratropium-albuterol (DUONEB) 0.5-2.5 (3) MG/3ML SOLN Take 3 mLs by nebulization every 6 (six) hours as needed. 12/11/16   Nicholes Mango, MD  lidocaine-prilocaine (EMLA) cream Apply 1 application topically as needed (prior to accessing port).    [provider]  losartan (COZAAR) 100 MG tablet Take 100 mg by mouth daily.     [provider]  multivitamin (RENA-VIT) TABS tablet Take 1 tablet by mouth at bedtime.     [provider]  ondansetron (ZOFRAN) 4 MG tablet Take 1  tablet (4 mg total) by mouth every 8 (eight) hours as needed for nausea or vomiting. 03/24/17   Lisa Roca, MD  pantoprazole (PROTONIX) 40 MG tablet Take 40 mg by mouth 2 (two) times daily.    [provider]  senna-docusate (SENOKOT-S) 8.6-50 MG tablet Take 1 tablet by mouth at bedtime as needed for mild constipation. 12/11/16   Nicholes Mango, MD  sertraline (ZOLOFT) 50 MG tablet Take 50 mg by mouth daily.     [provider]  traMADol (ULTRAM) 50 MG tablet Take 1 tablet (50 mg total) by mouth every 12 (twelve) hours as needed for moderate pain. 12/11/16   Nicholes Mango, MD   Physical Exam: Vitals:   06/15/17 1123 06/15/17 1126  BP:  (!) 174/90  Pulse:  81  Resp:  18   Temp:  98.3 F (36.8 C)  TempSrc:  Oral  SpO2:  100%  Weight: 59 kg (130 lb)   Height: 5\' 5"  (1.651 m)      General:  No apparent distress, [place]/AT, WDWN  Eyes: PERRL, EOMI, no scleral icterus. Conjunctiva clear  ENT: moist oropharynx without exudate, TM's benign, dentition fair  Neck: supple, no lymphadenopathy. No bruits or thyromegaly  Cardiovascular: regular rate without MRG; 2+ peripheral pulses, no JVD, 1+ peripheral edema  Respiratory: basilar rales without wheezes or rhonchi. No dullness. Respiratory effort normal  Abdomen: soft, non tender to palpation, positive bowel sounds, no guarding, no rebound  Skin: no rashes or lesions  Musculoskeletal: normal bulk and tone, no joint swelling  Psychiatric: normal mood and affect, A&OX3  Neurologic: CN 2-12 grossly intact, Motor strength 5/5 in all 4 groups with symmetric DTR's and non-focal sensory exam  Labs on Admission:  Basic Metabolic Panel:  Recent Labs Lab 06/15/17 1127  NA 139  K 5.4*  CL 99*  CO2 26  GLUCOSE 260*  BUN 78*  CREATININE 9.89*  CALCIUM 7.3*   Liver Function Tests: No results for input(s): AST, ALT, ALKPHOS, BILITOT, PROT, ALBUMIN in the last 168 hours. No results for input(s): LIPASE, AMYLASE in the last 168 hours. No results for input(s): AMMONIA in the last 168 hours. CBC:  Recent Labs Lab 06/15/17 1127  WBC 8.1  HGB 8.8*  HCT 26.8*  MCV 103.4*  PLT 284   Cardiac Enzymes: No results for input(s): CKTOTAL, CKMB, CKMBINDEX, TROPONINI in the last 168 hours.  BNP (last 3 results)  Recent Labs  09/14/16 0707 09/14/16 0926 10/29/16 0940  BNP 457.0* 329.0* 216.0*    ProBNP (last 3 results) No results for input(s): PROBNP in the last 8760 hours.  CBG: No results for input(s): GLUCAP in the last 168 hours.  Radiological Exams on Admission: Dg Chest 2 View  Result Date: 06/15/2017 CLINICAL DATA:  COPD, end-stage renal disease, fell this morning, lightheaded smoker,  hypertension EXAM: CHEST  2 VIEW COMPARISON:  06/05/2017 FINDINGS: RIGHT jugular central venous catheter with tip projecting over SVC. Enlargement of cardiac silhouette with pulmonary vascular congestion. Tortuous aorta with atherosclerotic calcification. Diffuse interstitial infiltrates likely pulmonary edema, question CHF versus fluid overload. Subsegmental atelectasis RIGHT base. No pleural effusion or pneumothorax. Bones demineralized. IMPRESSION: Enlargement of cardiac silhouette with pulmonary vascular congestion and diffuse interstitial edema question fluid overload versus CHF. Aortic Atherosclerosis (ICD10-I70.0). Electronically Signed   By: [name] M.D.   On: 06/15/2017 16:39   Ct Head Wo Contrast  Result Date: 06/15/2017 CLINICAL DATA:  [place] this morning, lightheaded EXAM: CT HEAD WITHOUT CONTRAST TECHNIQUE: Contiguous axial images  were obtained from the base of the skull through the vertex without intravenous contrast. COMPARISON:  12/04/2016, 10/06/2016 MRI FINDINGS: Brain: No acute territorial infarction, hemorrhage, or intracranial mass is seen. Minimal atrophy. Mild hypodensity in the periventricular white matter consistent with small vessel ischemic changes. Nonenlarged ventricles. Vascular: No hyperdense vessels. Carotid artery and vertebral artery calcifications. Skull: No fracture or suspicious bone lesion. Sinuses/Orbits: No acute finding. Other: None IMPRESSION: No CT evidence for acute intracranial abnormality. Electronically Signed   By: Donavan Foil M.D.   On: 06/15/2017 16:32    EKG: Independently reviewed.  Assessment/Plan Principal Problem:   Dizziness Active Problems:   ESRD on hemodialysis (HCC)   HTN (hypertension)   Chronic diastolic CHF (congestive heart failure) ([place])   Will observe on floor. Consult Nephrology for HD. Repeat labs in AM  Diet: renal Fluids: NS@75  DVT Prophylaxis: SQ Heparin  Code Status: FULL  Family Communication: none  Disposition  Plan: home  Time spent: 50 min

## 2017-06-15 NOTE — Progress Notes (Signed)
187/91 Primary nurse paged prime Dr. to notify of pt BP remaining increased after PRN medication given. Awaiting Callback. Primary nurse to continue to monitor pt.

## 2017-06-15 NOTE — ED Notes (Signed)
Primary RN with critical patient, this RN calling report at this time.

## 2017-06-16 LAB — URINALYSIS, COMPLETE (UACMP) WITH MICROSCOPIC
BILIRUBIN URINE: NEGATIVE
GLUCOSE, UA: 150 mg/dL — AB
KETONES UR: NEGATIVE mg/dL
Nitrite: NEGATIVE
PH: 8 (ref 5.0–8.0)
PROTEIN: 30 mg/dL — AB
Specific Gravity, Urine: 1.006 (ref 1.005–1.030)

## 2017-06-16 LAB — CBC
HEMATOCRIT: 25 % — AB (ref 35.0–47.0)
HEMOGLOBIN: 8.2 g/dL — AB (ref 12.0–16.0)
MCH: 34 pg (ref 26.0–34.0)
MCHC: 32.9 g/dL (ref 32.0–36.0)
MCV: 103.5 fL — ABNORMAL HIGH (ref 80.0–100.0)
Platelets: 251 10*3/uL (ref 150–440)
RBC: 2.41 MIL/uL — AB (ref 3.80–5.20)
RDW: 14.7 % — ABNORMAL HIGH (ref 11.5–14.5)
WBC: 3.1 10*3/uL — ABNORMAL LOW (ref 3.6–11.0)

## 2017-06-16 LAB — PHOSPHORUS: PHOSPHORUS: 7.4 mg/dL — AB (ref 2.5–4.6)

## 2017-06-16 LAB — COMPREHENSIVE METABOLIC PANEL
ALBUMIN: 3.2 g/dL — AB (ref 3.5–5.0)
ALK PHOS: 153 U/L — AB (ref 38–126)
ALT: 12 U/L — ABNORMAL LOW (ref 14–54)
ANION GAP: 13 (ref 5–15)
AST: 18 U/L (ref 15–41)
BUN: 88 mg/dL — ABNORMAL HIGH (ref 6–20)
CALCIUM: 8.1 mg/dL — AB (ref 8.9–10.3)
CO2: 24 mmol/L (ref 22–32)
Chloride: 99 mmol/L — ABNORMAL LOW (ref 101–111)
Creatinine, Ser: 10.61 mg/dL — ABNORMAL HIGH (ref 0.44–1.00)
GFR calc non Af Amer: 3 mL/min — ABNORMAL LOW (ref >60)
GFR, EST AFRICAN AMERICAN: 4 mL/min — AB (ref >60)
GLUCOSE: 114 mg/dL — AB (ref 65–99)
POTASSIUM: 5.4 mmol/L — AB (ref 3.5–5.1)
SODIUM: 136 mmol/L (ref 135–145)
TOTAL PROTEIN: 6.3 g/dL — AB (ref 6.5–8.1)
Total Bilirubin: 0.7 mg/dL (ref 0.3–1.2)

## 2017-06-16 LAB — MRSA PCR SCREENING: MRSA BY PCR: NEGATIVE

## 2017-06-16 MED ORDER — SODIUM CHLORIDE 0.9% FLUSH
3.0000 mL | Freq: Two times a day (BID) | INTRAVENOUS | Status: DC
  Administered 2017-06-16 – 2017-06-19 (×7): 3 mL via INTRAVENOUS

## 2017-06-16 MED ORDER — HYDRALAZINE HCL 20 MG/ML IJ SOLN
10.0000 mg | Freq: Once | INTRAMUSCULAR | Status: AC
  Administered 2017-06-16: 10 mg via INTRAVENOUS

## 2017-06-16 MED ORDER — METOPROLOL TARTRATE 5 MG/5ML IV SOLN: 5.0000 mg | Freq: Once | INTRAVENOUS | Status: DC

## 2017-06-16 MED ORDER — METOPROLOL TARTRATE 5 MG/5ML IV SOLN
5.0000 mg | Freq: Once | INTRAVENOUS | Status: AC
  Administered 2017-06-16: 5 mg via INTRAVENOUS
  Filled 2017-06-16: qty 5

## 2017-06-16 NOTE — Progress Notes (Signed)
Patient ID: Michele Nash, female   DOB: 1955-12-02, 61 y.o.   MRN: [id number]  Called several times by nursing regarding elevated BP. Patient has rec'd hydralazine 10 x 2, metoprolol 5 IV x2.  Will transfer to tele and start IV drip if not responding.  Monitor closely.

## 2017-06-16 NOTE — Progress Notes (Signed)
This note also relates to the following rows which could not be included: Pulse Rate - Cannot attach notes to unvalidated device data Resp - Cannot attach notes to unvalidated device data BP - Cannot attach notes to unvalidated device data  HD STARTED  

## 2017-06-16 NOTE — Progress Notes (Signed)
Pt tx to 2A RM 257 from [place]. Pressure currently 180's/80's. Zofran given for pain and Vicodin for generalized pain and HA. Will continue to assess per MD request until pt is able to receive HD tomorrow. Pt currently resting in bed with bed alarm and call bell in reach.

## 2017-06-16 NOTE — Progress Notes (Signed)
POST DIALYSIS ASSESSMENT 

## 2017-06-16 NOTE — Progress Notes (Signed)
PT Cancellation Note  Patient Details Name: Michele Nash MRN: [id number] DOB: 11-Apr-1956   Cancelled Treatment:    Reason Eval/Treat Not Completed: Medical issues which prohibited therapy. PT attempted evaluation at 1305. Patient reportedly on 3L O2 at home, living with husband in apartment with 14 steps to enter. Patient's BP resting in bed 210/107 mmHg and 198/88 mmHg. RN notified. Performed supine to sit transfer independently with BP increasing to 200/84 mmHg. Mobility assessment being held due to increased BP levels. Will check back later if time permits.   Dorice Lamas, PT, DPT 06/16/2017, 1:24 PM

## 2017-06-16 NOTE — Discharge Summary (Signed)
[facility] NAME: Michele Nash    MR#:  [id number]  DATE OF BIRTH:  17-Feb-1956  DATE OF ADMISSION:  06/15/2017 ADMITTING PHYSICIAN: Idelle Crouch, MD  DATE OF DISCHARGE: 06/16/2017  PRIMARY CARE PHYSICIAN: [name], MD    ADMISSION DIAGNOSIS:  Generalized weakness [R53.1] Fall, initial encounter [W19.XXXA] Acute on chronic respiratory failure with hypoxia (HCC) [J96.21] Acute on chronic congestive heart failure, unspecified heart failure type ([place]) [I50.9]  DISCHARGE DIAGNOSIS:  Principal Problem:   Dizziness Active Problems:   ESRD on hemodialysis (HCC)   HTN (hypertension)   Chronic diastolic CHF (congestive heart failure) ([place])   SECONDARY DIAGNOSIS:   Past Medical History:  Diagnosis Date  . Altered mental status   . Anemia   . Apnea, sleep    for sleep study 10/17/15-no cpap yet  . Arthritis   . Bronchitis   . Chronic kidney disease   . COPD (chronic obstructive pulmonary disease) ([place])   . Depression   . Dialysis patient ([place]) 2014  . GERD (gastroesophageal reflux disease)   . GIB (gastrointestinal bleeding) 07/09/2016  . Headache   . Hyperlipidemia   . Hypertension   . Obesity   . Pulmonary edema 09/14/2016  . Renal dialysis device, implant, or graft complication    RIGHT CHEST CATH  . Respiratory failure ([place]) 07/23/2016  . Traumatic hematoma of forehead   . Type II diabetes mellitus ([place]) 09/21/2009   diet controlled    HOSPITAL COURSE:   61 year old female with past medical history of end-stage renal disease on hemodialysis, hypertension, hyperlipidemia, previous history of GI bleed, COPD, obstructive sleep apnea, depression who presented to the hospital due to falls/dizziness.  1. Dizziness/falls-etiology unclear presently. Patient's orthostatic vital signs are negative. -Her CT head was negative for acute pathology. Patient was seen by physical therapy who did not recommend any services  but outpatient follow-up. Patient presently is not complaining any dizziness and has had no falls or ablating while in the hospital and therefore being discharged home.  2. End-stage renal disease on hemodialysis-patient missed her hemodialysis yesterday and will get hemodialysis today prior to discharge.  3. Essential HTN - patient will continue Coreg, monitor pain.  4. Secondary hyperparathyroidism-continue patient's PhosLo.  5. History of anxiety-patient will continue his Xanax.  6. Depression-patient will continue her Zoloft.   7. GERD - pt. Will cont. Her Protonix.   Stable to be discharged home after HD.    DISCHARGE CONDITIONS:   Stable.   CONSULTS OBTAINED:  Treatment Team:  Anthonette Legato, MD  DRUG ALLERGIES:   Allergies  Allergen Reactions  . Chantix [Varenicline]     hallucinations  . Sulfa Antibiotics Itching, Swelling, Rash and Other (See Comments)    Reaction:  Facial/body swelling     DISCHARGE MEDICATIONS:   Allergies as of 06/16/2017      Reactions   Chantix [varenicline]    hallucinations   Sulfa Antibiotics Itching, Swelling, Rash, Other (See Comments)   Reaction:  Facial/body swelling       Medication List    TAKE these medications   acetaminophen 325 MG tablet Commonly known as:  TYLENOL Take 2 tablets (650 mg total) by mouth every 6 (six) hours as needed for mild pain (or Fever >/= 101).   albuterol 108 (90 Base) MCG/ACT inhaler Commonly known as:  PROVENTIL HFA;VENTOLIN HFA Inhale 2 puffs into the lungs every 6 (six) hours as needed for wheezing or shortness of breath.  ALPRAZolam 0.5 MG tablet Commonly known as:  XANAX Take 0.5 mg by mouth 2 (two) times daily.   amLODipine 10 MG tablet Commonly known as:  NORVASC Take 10 mg by mouth daily.   calcium acetate 667 MG capsule Commonly known as:  PHOSLO Take 1,334 mg by mouth 3 (three) times daily with meals. 1 tablet with bedtime snack.   carvedilol 12.5 MG tablet Commonly  known as:  COREG Take 12.5 mg by mouth 2 (two) times daily with a meal.   ezetimibe 10 MG tablet Commonly known as:  ZETIA Take 10 mg by mouth daily.   feeding supplement (ENSURE ENLIVE) Liqd Take 237 mLs by mouth 3 (three) times daily between meals.   feeding supplement (PRO-STAT SUGAR FREE 64) Liqd Take 30 mLs by mouth daily.   fluticasone 50 MCG/ACT nasal spray Commonly known as:  FLONASE Place 2 sprays into both nostrils daily as needed for rhinitis.   furosemide 40 MG tablet Commonly known as:  LASIX Take 40 mg by mouth every Tuesday, Thursday, Saturday, and Sunday.   HYDROcodone-acetaminophen 5-325 MG tablet Commonly known as:  NORCO Take 1-2 tablets by mouth every 6 (six) hours as needed for moderate pain or severe pain.   ipratropium-albuterol 0.5-2.5 (3) MG/3ML Soln Commonly known as:  DUONEB Take 3 mLs by nebulization every 6 (six) hours as needed.   lidocaine-prilocaine cream Commonly known as:  EMLA Apply 1 application topically as needed (prior to accessing port).   losartan 100 MG tablet Commonly known as:  COZAAR Take 100 mg by mouth daily.   multivitamin Tabs tablet Take 1 tablet by mouth at bedtime.   ondansetron 4 MG tablet Commonly known as:  ZOFRAN Take 1 tablet (4 mg total) by mouth every 8 (eight) hours as needed for nausea or vomiting.   pantoprazole 40 MG tablet Commonly known as:  PROTONIX Take 40 mg by mouth 2 (two) times daily.   senna-docusate 8.6-50 MG tablet Commonly known as:  Senokot-S Take 1 tablet by mouth at bedtime as needed for mild constipation.   sertraline 50 MG tablet Commonly known as:  ZOLOFT Take 50 mg by mouth daily.   traMADol 50 MG tablet Commonly known as:  ULTRAM Take 1 tablet (50 mg total) by mouth every 12 (twelve) hours as needed for moderate pain.         DISCHARGE INSTRUCTIONS:   DIET:  Cardiac diet and Renal diet  DISCHARGE CONDITION:  Stable  ACTIVITY:  Activity as tolerated  OXYGEN:   Home Oxygen: No.   Oxygen Delivery: room air  DISCHARGE LOCATION:  home   If you experience worsening of your admission symptoms, develop shortness of breath, life threatening emergency, suicidal or homicidal thoughts you must seek medical attention immediately by calling 911 or calling your MD immediately  if symptoms less severe.  You Must read complete instructions/literature along with all the possible adverse reactions/side effects for all the Medicines you take and that have been prescribed to you. Take any new Medicines after you have completely understood and accpet all the possible adverse reactions/side effects.   Please note  You were cared for by a hospitalist during your hospital stay. If you have any questions about your discharge medications or the care you received while you were in the hospital after you are discharged, you can call the unit and asked to speak with the hospitalist on call if the hospitalist that took care of you is not available. Once you are discharged, your  primary care physician will handle any further medical issues. Please note that NO REFILLS for any discharge medications will be authorized once you are discharged, as it is imperative that you return to your primary care physician (or establish a relationship with a primary care physician if you do not have one) for your aftercare needs so that they can reassess your need for medications and monitor your lab values.     Today   No further falls overnight. Orthostatics are negative. Seen by physical therapy who recommended outpatient PT. Patient clinically denies any complaints presently. We'll discharge home after hemodialysis today.  VITAL SIGNS:  Blood pressure (!) 152/81, pulse 71, temperature 98.8 F (37.1 C), temperature source Oral, resp. rate (!) 22, height 5\' 5"  (1.651 m), weight 62.8 kg (138 lb 6.4 oz), SpO2 94 %.  I/O:   Intake/Output Summary (Last 24 hours) at 06/16/17 1537 Last data  filed at 06/16/17 1500  Gross per 24 hour  Intake           571.58 ml  Output              820 ml  Net          -248.42 ml    PHYSICAL EXAMINATION:  GENERAL:  61 y.o.-year-old patient lying in the bed with no acute distress.  EYES: Pupils equal, round, reactive to light and accommodation. No scleral icterus. Extraocular muscles intact.  HEENT: Head atraumatic, normocephalic. Oropharynx and nasopharynx clear.  NECK:  Supple, no jugular venous distention. No thyroid enlargement, no tenderness.  LUNGS: Normal breath sounds bilaterally, no wheezing, rales,rhonchi. No use of accessory muscles of respiration.  CARDIOVASCULAR: S1, S2 normal. No murmurs, rubs, or gallops.  ABDOMEN: Soft, non-tender, non-distended. Bowel sounds present. No organomegaly or mass.  EXTREMITIES: No pedal edema, cyanosis, or clubbing.  NEUROLOGIC: Cranial nerves II through XII are intact. No focal motor or sensory defecits b/l.  PSYCHIATRIC: The patient is alert and oriented x 3. SKIN: No obvious rash, lesion, or ulcer.   Left upper extremity AV fistula with good bruit and thrill  DATA REVIEW:   CBC  Recent Labs Lab 06/15/17 1127  WBC 8.1  HGB 8.8*  HCT 26.8*  PLT 284    Chemistries   Recent Labs Lab 06/15/17 1127  NA 139  K 5.4*  CL 99*  CO2 26  GLUCOSE 260*  BUN 78*  CREATININE 9.89*  CALCIUM 7.3*    Cardiac Enzymes No results for input(s): TROPONINI in the last 168 hours.  Microbiology Results  Results for orders placed or performed during the hospital encounter of 06/15/17  MRSA PCR Screening     Status: None   Collection Time: 06/16/17 12:32 AM  Result Value Ref Range Status   MRSA by PCR NEGATIVE NEGATIVE Final    Comment:        The GeneXpert MRSA Assay (FDA approved for NASAL specimens only), is one component of a comprehensive MRSA colonization surveillance program. It is not intended to diagnose MRSA infection nor to guide or monitor treatment for MRSA infections.      RADIOLOGY:  Dg Chest 2 View  Result Date: 06/15/2017 CLINICAL DATA:  COPD, end-stage renal disease, fell this morning, lightheaded smoker, hypertension EXAM: CHEST  2 VIEW COMPARISON:  06/05/2017 FINDINGS: RIGHT jugular central venous catheter with tip projecting over SVC. Enlargement of cardiac silhouette with pulmonary vascular congestion. Tortuous aorta with atherosclerotic calcification. Diffuse interstitial infiltrates likely pulmonary edema, question CHF versus fluid overload. Subsegmental atelectasis RIGHT  base. No pleural effusion or pneumothorax. Bones demineralized. IMPRESSION: Enlargement of cardiac silhouette with pulmonary vascular congestion and diffuse interstitial edema question fluid overload versus CHF. Aortic Atherosclerosis (ICD10-I70.0). Electronically Signed   By: [name] M.D.   On: 06/15/2017 16:39   Ct Head Wo Contrast  Result Date: 06/15/2017 CLINICAL DATA:  [place] this morning, lightheaded EXAM: CT HEAD WITHOUT CONTRAST TECHNIQUE: Contiguous axial images were obtained from the base of the skull through the vertex without intravenous contrast. COMPARISON:  12/04/2016, 10/06/2016 MRI FINDINGS: Brain: No acute territorial infarction, hemorrhage, or intracranial mass is seen. Minimal atrophy. Mild hypodensity in the periventricular white matter consistent with small vessel ischemic changes. Nonenlarged ventricles. Vascular: No hyperdense vessels. Carotid artery and vertebral artery calcifications. Skull: No fracture or suspicious bone lesion. Sinuses/Orbits: No acute finding. Other: None IMPRESSION: No CT evidence for acute intracranial abnormality. Electronically Signed   By: Donavan Foil M.D.   On: 06/15/2017 16:32      Management plans discussed with the patient, family and they are in agreement.  CODE STATUS:     Code Status Orders        Start     Ordered   06/15/17 1943  Full code  Continuous     06/15/17 1942     TOTAL TIME TAKING CARE OF THIS PATIENT:  40 minutes.    Henreitta Leber M.D on 06/16/2017 at 3:37 PM  Between 7am to 6pm - Pager - [phone]  After 6pm go to [email or web address] - Proofreader  [facility]  Office  [phone]  CC: Primary care physician; [name], MD

## 2017-06-16 NOTE — Progress Notes (Signed)
Patient had a fall prior to hemodialysis yesterday. She was instructed to come here for evaluation. Her initial workup was negative however patient ended up missing hemodialysis. As such we will schedule her for dialysis today and she may be discharged thereafter.

## 2017-06-16 NOTE — Progress Notes (Signed)
Primary Nurse notified Dr. Ara Kussmaul of pt Increased BP after Metoprolol given. Orders received for 10 mg Hydralizine IV once. Primary nurse to continue to monitor.

## 2017-06-16 NOTE — Progress Notes (Signed)
Primary nurse spoke to Dr. Almyra Free in regard to pt increased BP after PRN given. Orders received for Metoprolol 5 mg IV once. Primary nurse to continue to monitor.

## 2017-06-16 NOTE — Progress Notes (Signed)
Physical Therapy Evaluation Patient Details Name: Michele Nash MRN: [id number] DOB: Apr 14, 1956 Today's Date: 06/16/2017   History of Present Illness  Patient is a 61 y.o. female admitted on 73 JUL for increasing dizziness. PMH includes ESRD on HD and chronic CHF on 3L home O2.  Clinical Impression  Patient is a pleasant female admitted for above listed reasons. Upon initial attempted evaluation, patient was limited by high blood pressure. Per RN, BP had normalized, so PT returned to complete evaluation. Patient demonstrates independence in all aspects of bed mobility, transfers, and gait; however, she is limited by R ankle pain and decreased ROM/strength. Patient will benefit from f/u at OP PT with an emphasis in manual therapy to restore ROM and reduce pain, allowing for improved gait mechanics.    Follow Up Recommendations Outpatient PT    Equipment Recommendations  None recommended by PT    Recommendations for Other Services       Precautions / Restrictions Precautions Precautions: None Restrictions Weight Bearing Restrictions: No      Mobility  Bed Mobility Overal bed mobility: Independent             General bed mobility comments: Patient performs bed mobility independently.  Transfers Overall transfer level: Independent Equipment used: None             General transfer comment: Patient moves from sit to stand and stand to sit with good control.  Ambulation/Gait Ambulation/Gait assistance: Min guard Ambulation Distance (Feet): 200 Feet Assistive device: None       General Gait Details: Patient ambulates independently with no LOB and slight antalgic gait on R LE with decreased dorsiflexion on R.  Stairs            Wheelchair Mobility    Modified Rankin (Stroke Patients Only)       Balance Overall balance assessment: Modified Independent                                           Pertinent Vitals/Pain Pain Assessment:  Faces Faces Pain Scale: Hurts a little bit Pain Location: R ankle Pain Descriptors / Indicators: Aching Pain Intervention(s): Limited activity within patient's tolerance;Monitored during session    Home Living Family/patient expects to be discharged to:: Private residence Living Arrangements: Spouse/significant other Available Help at Discharge: Family;Available PRN/intermittently Type of Home: Apartment Home Access: Stairs to enter Entrance Stairs-Rails: Can reach both Entrance Stairs-Number of Steps: 14 Home Layout: One level Home Equipment: None      Prior Function Level of Independence: Independent               Hand Dominance        Extremity/Trunk Assessment   Upper Extremity Assessment Upper Extremity Assessment: Generalized weakness;Overall WFL for tasks assessed    Lower Extremity Assessment Lower Extremity Assessment: Overall WFL for tasks assessed;RLE deficits/detail RLE Deficits / Details: Limited dorsiflexion and ankle strength, secondary to pain RLE: Unable to fully assess due to pain       Communication   Communication: No difficulties  Cognition Arousal/Alertness: Awake/alert Behavior During Therapy: WFL for tasks assessed/performed Overall Cognitive Status: Within Functional Limits for tasks assessed                                        General  Comments      Exercises     Assessment/Plan    PT Assessment Patent does not need any further PT services  PT Problem List         PT Treatment Interventions      PT Goals (Current goals can be found in the Care Plan section)  Acute Rehab PT Goals Patient Stated Goal: To go home PT Goal Formulation: With patient Time For Goal Achievement: 06/30/17 Potential to Achieve Goals: Good    Frequency     Barriers to discharge        Co-evaluation               AM-PAC PT "6 Clicks" Daily Activity  Outcome Measure Difficulty turning over in bed (including  adjusting bedclothes, sheets and blankets)?: None Difficulty moving from lying on back to sitting on the side of the bed? : None Difficulty sitting down on and standing up from a chair with arms (e.g., wheelchair, bedside commode, etc,.)?: None Help needed moving to and from a bed to chair (including a wheelchair)?: None Help needed walking in hospital room?: A Little Help needed climbing 3-5 steps with a railing? : A Little 6 Click Score: 22    End of Session Equipment Utilized During Treatment: Gait belt;Oxygen Activity Tolerance: Patient tolerated treatment well Patient left: in bed;with call bell/phone within reach   PT Visit Diagnosis: Difficulty in walking, not elsewhere classified (R26.2);Pain Pain - Right/Left: Right Pain - part of body: Ankle and joints of foot    Time: 4481-8563 PT Time Calculation (min) (ACUTE ONLY): 12 min   Charges:   PT Evaluation $PT Eval Low Complexity: 1 Procedure     PT G Codes:   PT G-Codes **NOT FOR INPATIENT CLASS** Functional Assessment Tool Used: AM-PAC 6 Clicks Basic Mobility;Clinical judgement Functional Limitation: Mobility: Walking and moving around Mobility: Walking and Moving Around Current Status (J4970): At least 20 percent but less than 40 percent impaired, limited or restricted Mobility: Walking and Moving Around Goal Status [phone]): At least 1 percent but less than 20 percent impaired, limited or restricted Mobility: Walking and Moving Around Discharge Status [phone]): At least 1 percent but less than 20 percent impaired, limited or restricted      Dorice Lamas, PT, DPT 06/16/2017, 3:28 PM

## 2017-06-16 NOTE — Progress Notes (Signed)
Pt BP remained elevated. Dr. Kela Millin notified. Orders received to given Metoprolol 5 mg IV push and transfer to 2A. Pt informed of being transferred to another unit.

## 2017-06-16 NOTE — Care Management Obs Status (Signed)
[facility] NOTIFICATION   Patient Details  Name: Michele Nash MRN: [id number] Date of Birth: January 07, 1956   Medicare Observation Status Notification Given:  Yes [facility] letter)    Mardene Speak, RN 06/16/2017, 3:47 PM

## 2017-06-16 NOTE — Progress Notes (Signed)
Patient does not appear to be prepared for discharge. Patient has complaints about ears hurting, that she forgot to discuss with physician. Family was not prepared to pick up patient tonight. Patient will require oxygen for transport to house. Notified Dr. Baruch Merl about concerns and to discontinue discharge.

## 2017-06-16 NOTE — Progress Notes (Signed)
PRE DIALYSIS ASSESSMENT 

## 2017-06-16 NOTE — Progress Notes (Signed)
HD COMPLETED  

## 2017-06-16 NOTE — Progress Notes (Signed)
Spoke with Dr. Estanislado Pandy, new order to stop fluids and contact nephrology in morning for pt. To receive HD related to being in fluid overload related to missing HD on Saturday.

## 2017-06-17 DIAGNOSIS — J9621 Acute and chronic respiratory failure with hypoxia: Secondary | ICD-10-CM | POA: Diagnosis present

## 2017-06-17 DIAGNOSIS — N186 End stage renal disease: Secondary | ICD-10-CM | POA: Diagnosis present

## 2017-06-17 DIAGNOSIS — F17210 Nicotine dependence, cigarettes, uncomplicated: Secondary | ICD-10-CM | POA: Diagnosis present

## 2017-06-17 DIAGNOSIS — R042 Hemoptysis: Secondary | ICD-10-CM | POA: Diagnosis not present

## 2017-06-17 DIAGNOSIS — I700 Atherosclerosis of aorta: Secondary | ICD-10-CM | POA: Diagnosis present

## 2017-06-17 DIAGNOSIS — N390 Urinary tract infection, site not specified: Secondary | ICD-10-CM | POA: Diagnosis present

## 2017-06-17 DIAGNOSIS — Z9981 Dependence on supplemental oxygen: Secondary | ICD-10-CM | POA: Diagnosis not present

## 2017-06-17 DIAGNOSIS — D631 Anemia in chronic kidney disease: Secondary | ICD-10-CM | POA: Diagnosis present

## 2017-06-17 DIAGNOSIS — Z79899 Other long term (current) drug therapy: Secondary | ICD-10-CM | POA: Diagnosis not present

## 2017-06-17 DIAGNOSIS — F329 Major depressive disorder, single episode, unspecified: Secondary | ICD-10-CM | POA: Diagnosis present

## 2017-06-17 DIAGNOSIS — R42 Dizziness and giddiness: Secondary | ICD-10-CM | POA: Diagnosis not present

## 2017-06-17 DIAGNOSIS — K219 Gastro-esophageal reflux disease without esophagitis: Secondary | ICD-10-CM | POA: Diagnosis present

## 2017-06-17 DIAGNOSIS — R531 Weakness: Secondary | ICD-10-CM | POA: Diagnosis present

## 2017-06-17 DIAGNOSIS — J069 Acute upper respiratory infection, unspecified: Secondary | ICD-10-CM | POA: Diagnosis present

## 2017-06-17 DIAGNOSIS — F419 Anxiety disorder, unspecified: Secondary | ICD-10-CM | POA: Diagnosis present

## 2017-06-17 DIAGNOSIS — I5032 Chronic diastolic (congestive) heart failure: Secondary | ICD-10-CM | POA: Diagnosis present

## 2017-06-17 DIAGNOSIS — J449 Chronic obstructive pulmonary disease, unspecified: Secondary | ICD-10-CM | POA: Diagnosis present

## 2017-06-17 DIAGNOSIS — N2581 Secondary hyperparathyroidism of renal origin: Secondary | ICD-10-CM | POA: Diagnosis present

## 2017-06-17 DIAGNOSIS — Z992 Dependence on renal dialysis: Secondary | ICD-10-CM | POA: Diagnosis not present

## 2017-06-17 DIAGNOSIS — G4733 Obstructive sleep apnea (adult) (pediatric): Secondary | ICD-10-CM | POA: Diagnosis present

## 2017-06-17 DIAGNOSIS — M1990 Unspecified osteoarthritis, unspecified site: Secondary | ICD-10-CM | POA: Diagnosis present

## 2017-06-17 DIAGNOSIS — E1122 Type 2 diabetes mellitus with diabetic chronic kidney disease: Secondary | ICD-10-CM | POA: Diagnosis present

## 2017-06-17 DIAGNOSIS — E785 Hyperlipidemia, unspecified: Secondary | ICD-10-CM | POA: Diagnosis present

## 2017-06-17 DIAGNOSIS — I132 Hypertensive heart and chronic kidney disease with heart failure and with stage 5 chronic kidney disease, or end stage renal disease: Secondary | ICD-10-CM | POA: Diagnosis present

## 2017-06-17 LAB — PHOSPHORUS: PHOSPHORUS: 6.3 mg/dL — AB (ref 2.5–4.6)

## 2017-06-17 MED ORDER — CALCIUM ACETATE (PHOS BINDER) 667 MG PO CAPS
2001.0000 mg | ORAL_CAPSULE | Freq: Three times a day (TID) | ORAL | Status: DC
  Administered 2017-06-17 – 2017-06-20 (×8): 2001 mg via ORAL
  Filled 2017-06-17 (×8): qty 3

## 2017-06-17 MED ORDER — DEXTROSE 5 % IV SOLN
1.0000 g | INTRAVENOUS | Status: AC
  Administered 2017-06-17 – 2017-06-19 (×3): 1 g via INTRAVENOUS
  Filled 2017-06-17 (×3): qty 10

## 2017-06-17 MED ORDER — HYDRALAZINE HCL 25 MG PO TABS
25.0000 mg | ORAL_TABLET | Freq: Three times a day (TID) | ORAL | Status: DC
  Administered 2017-06-17 – 2017-06-18 (×3): 25 mg via ORAL
  Filled 2017-06-17 (×3): qty 1

## 2017-06-17 MED ORDER — DIPHENHYDRAMINE HCL 25 MG PO CAPS
25.0000 mg | ORAL_CAPSULE | Freq: Once | ORAL | Status: AC
  Administered 2017-06-17: 25 mg via ORAL
  Filled 2017-06-17: qty 1

## 2017-06-17 MED ORDER — EPOETIN ALFA 10000 UNIT/ML IJ SOLN
10000.0000 [IU] | INTRAMUSCULAR | Status: DC
  Administered 2017-06-17 – 2017-06-19 (×2): 10000 [IU] via INTRAVENOUS

## 2017-06-17 MED ORDER — HYDROCODONE-ACETAMINOPHEN 5-325 MG PO TABS
1.0000 | ORAL_TABLET | Freq: Four times a day (QID) | ORAL | Status: DC | PRN
  Administered 2017-06-17: 1 via ORAL
  Filled 2017-06-17: qty 1

## 2017-06-17 MED ORDER — IBUPROFEN 400 MG PO TABS
400.0000 mg | ORAL_TABLET | Freq: Four times a day (QID) | ORAL | Status: DC
  Administered 2017-06-17 (×2): 400 mg via ORAL
  Filled 2017-06-17 (×4): qty 1

## 2017-06-17 MED ORDER — ALPRAZOLAM 0.5 MG PO TABS
0.5000 mg | ORAL_TABLET | Freq: Three times a day (TID) | ORAL | Status: DC
  Administered 2017-06-17 – 2017-06-18 (×2): 0.5 mg via ORAL
  Filled 2017-06-17 (×2): qty 1

## 2017-06-17 NOTE — Progress Notes (Signed)
To dialysis via bed.

## 2017-06-17 NOTE — Progress Notes (Signed)
Pre hd info 

## 2017-06-17 NOTE — Progress Notes (Signed)
[facility] [place] NAME: Michele Nash    MR#:  [id number]  DATE OF BIRTH:  [birth year]  SUBJECTIVE:   Pt. Here due to dizziness and falls.  No prodromal symptoms prior to her falls.  No other complaints presently.  Missed dialysis yesterday and to get HD today.   REVIEW OF SYSTEMS:    Review of Systems  Constitutional: Negative for chills and fever.  HENT: Negative for congestion and tinnitus.   Eyes: Negative for blurred vision and double vision.  Respiratory: Negative for cough, shortness of breath and wheezing.   Cardiovascular: Negative for chest pain, orthopnea and PND.  Gastrointestinal: Negative for abdominal pain, diarrhea, nausea and vomiting.  Genitourinary: Negative for dysuria and hematuria.  Musculoskeletal: Positive for falls.  Neurological: Positive for weakness. Negative for dizziness, sensory change and focal weakness.  All other systems reviewed and are negative.   Nutrition: Renal diet Tolerating Diet: Yes Tolerating PT: Await Eval  DRUG ALLERGIES:   Allergies  Allergen Reactions  . Chantix [Varenicline]     hallucinations  . Sulfa Antibiotics Itching, Swelling, Rash and Other (See Comments)    Reaction:  Facial/body swelling     VITALS:  Blood pressure (!) 121/54, pulse 79, temperature 99.7 F (37.6 C), temperature source Oral, resp. rate 19, height 5\' 5"  (1.651 m), weight 59.5 kg (131 lb 2.8 oz), SpO2 95 %.  PHYSICAL EXAMINATION:   Physical Exam  GENERAL:  61 y.o.-year-old patient lying in bed in no acute distress.  EYES: Pupils equal, round, reactive to light and accommodation. No scleral icterus. Extraocular muscles intact.  HEENT: Head atraumatic, normocephalic. Oropharynx and nasopharynx clear.  NECK:  Supple, no jugular venous distention. No thyroid enlargement, no tenderness.  LUNGS: Normal breath sounds bilaterally, no wheezing, rales, rhonchi. No use of accessory muscles of respiration.   CARDIOVASCULAR: S1, S2 normal. No murmurs, rubs, or gallops.  ABDOMEN: Soft, nontender, nondistended. Bowel sounds present. No organomegaly or mass.  EXTREMITIES: No cyanosis, clubbing or edema b/l.    NEUROLOGIC: Cranial nerves II through XII are intact. No focal Motor or sensory deficits b/l.   PSYCHIATRIC: The patient is alert and oriented x 3.  SKIN: No obvious rash, lesion, or ulcer.   Left upper extremity AV fistula with good bruit & thrill.  LABORATORY PANEL:   CBC  Recent Labs Lab 06/16/17 1604  WBC 3.1*  HGB 8.2*  HCT 25.0*  PLT 251   ------------------------------------------------------------------------------------------------------------------  Chemistries   Recent Labs Lab 06/16/17 1604  NA 136  K 5.4*  CL 99*  CO2 24  GLUCOSE 114*  BUN 88*  CREATININE 10.61*  CALCIUM 8.1*  AST 18  ALT 12*  ALKPHOS 153*  BILITOT 0.7   ------------------------------------------------------------------------------------------------------------------  Cardiac Enzymes No results for input(s): TROPONINI in the last 168 hours. ------------------------------------------------------------------------------------------------------------------  RADIOLOGY:  No results found.   ASSESSMENT AND PLAN:   61 year old female with past medical history of end-stage renal disease on hemodialysis, hypertension, hyperlipidemia, previous history of GI bleed, COPD, obstructive sleep apnea, depression who presented to the hospital due to falls/dizziness.  1. Dizziness/falls-etiology unclear presently. Patient's orthostatic vital signs are negative. -Her CT head was negative for acute pathology.  - Await PT eval.  No prodromal symptoms of chest pain, SOB, palpitations, prior to her falls.    2. End-stage renal disease on hemodialysis-patient missed her hemodialysis yesterday and will get hemodialysis today - cont. Further care as per Nephrology  3. Essential HTN - continue  Coreg.  4. Secondary hyperparathyroidism-continue PhosLo.  5. History of anxiety- cont Xanax.  6. Depression- continue her Zoloft.   7. GERD - cont Protonix     All the records are reviewed and case discussed with Care Management/Social Worker. Management plans discussed with the patient, family and they are in agreement.  CODE STATUS: full code  DVT Prophylaxis: Hep. SQ  TOTAL TIME TAKING CARE OF THIS PATIENT: 25 minutes.   POSSIBLE D/C IN 1-2 DAYS, DEPENDING ON CLINICAL CONDITION.   Henreitta Leber M.D on 06/17/2017 at 9:02 PM  Between 7am to 6pm - Pager - [phone]  After 6pm go to [email or web address] - Proofreader  [facility]  Office  [phone]  CC: Primary care physician; [name], MD

## 2017-06-17 NOTE — Progress Notes (Signed)
Pt insistent on husband helping her to bathroom while he is here.  Explained to her I would rather [place] help her due to her recent fall.  Pt very adamant and agitated regarding this stating it is "ridiculous"  I compromised with pt and allowed husband to ambulate her to br while I was present in room.

## 2017-06-17 NOTE — Progress Notes (Signed)
Dialysis called to have me bring pt pain medication for her back, upon arrival, pt sleeping, had to wake pt up to administer pain medications, pt states her pain is 9/10

## 2017-06-17 NOTE — Care Management (Signed)
Patient placed in observation after falling at her outpatient dialysis clinic 06/15/2017.  She did not receive her dialysis treatment.  During stay, she has required some IV doses of medication to control blood pressure.    Notified [name] with [facility] [employer] of observation. Receives her dialysis treatments at [facility] on [address] There was a discharge order for 06/16/2017 but was cancelled due to complaints of "not feeling well."  She  had a temp up to 102.9 around 3:30  this morning.

## 2017-06-17 NOTE — Progress Notes (Signed)
Post hd assessment 

## 2017-06-17 NOTE — Progress Notes (Signed)
[place] at [place] NAME: Michele Nash    MR#:  [id number]  DATE OF BIRTH:  09-28-1956  SUBJECTIVE:  CHIEF COMPLAINT:   Chief Complaint  Patient presents with  . Fall  . Weakness  discharge had to be cancelled y'day as she was c/o ear pain and had high fever, When I saw her she was very sleepy and barely arousable, per nursing she received norco earlier for pain. dialysed earlier today. BP very high REVIEW OF SYSTEMS:  Review of Systems  Unable to perform ROS: Acuity of condition   DRUG ALLERGIES:   Allergies  Allergen Reactions  . Chantix [Varenicline]     hallucinations  . Sulfa Antibiotics Itching, Swelling, Rash and Other (See Comments)    Reaction:  Facial/body swelling    VITALS:  Blood pressure (!) 180/77, pulse 84, temperature 99.1 F (37.3 C), temperature source Oral, resp. rate 19, height 5\' 5"  (1.651 m), weight 59.5 kg (131 lb 2.8 oz), SpO2 97 %. PHYSICAL EXAMINATION:  Physical Exam  Constitutional: She appears lethargic and malnourished.  HENT:  Head: Normocephalic and atraumatic.  Eyes: Pupils are equal, round, and reactive to light. Conjunctivae and EOM are normal.  Neck: Normal range of motion. Neck supple. No tracheal deviation present. No thyromegaly present.  Cardiovascular: Normal rate, regular rhythm and normal heart sounds.   Pulmonary/Chest: Effort normal and breath sounds normal. No respiratory distress. She has no wheezes. She exhibits no tenderness.  Abdominal: Soft. Bowel sounds are normal. She exhibits no distension. There is no tenderness.  Musculoskeletal: Normal range of motion.  Neurological: She appears lethargic. No cranial nerve deficit.  Difficult to evaluate due to her mental state  Skin: Skin is warm and dry. No rash noted.  Psychiatric: Mood and affect normal. She exhibits disordered thought content.  sleepy   LABORATORY PANEL:  Female CBC  Recent Labs Lab 06/16/17 1604  WBC 3.1*    HGB 8.2*  HCT 25.0*  PLT 251   ------------------------------------------------------------------------------------------------------------------ Chemistries   Recent Labs Lab 06/16/17 1604  NA 136  K 5.4*  CL 99*  CO2 24  GLUCOSE 114*  BUN 88*  CREATININE 10.61*  CALCIUM 8.1*  AST 18  ALT 12*  ALKPHOS 153*  BILITOT 0.7   RADIOLOGY:  No results found. ASSESSMENT AND PLAN:  61 year old female with past medical history of end-stage renal disease on hemodialysis, hypertension, hyperlipidemia, previous history of GI bleed, COPD, obstructive sleep apnea, depression who presented to the hospital due to falls/dizziness.  1. Dizziness/falls-etiology unclear presently. Patient's orthostatic vital signs are negative. -Her CT head was negative for acute pathology.   * UTI: based on UA, await urine c/s - start rocephin  2. End-stage renal disease on hemodialysis-HD per nephro  3. Essential HTN - continue Coreg, monitor pain.  4. Secondary hyperparathyroidism-continuePhosLo.  5. History of anxiety-continue Xanax.  6. Depression-continue her Zoloft.   7. GERD -  cont.  Protonix.      All the records are reviewed and case discussed with Care Management/Social Worker. Management plans discussed with the patient, nursing and they are in agreement.  CODE STATUS: Full Code  TOTAL TIME TAKING CARE OF THIS PATIENT: 25 minutes.   More than 50% of the time was spent in counseling/coordination of care: YES  POSSIBLE D/C IN 1-2 DAYS, DEPENDING ON CLINICAL CONDITION.   Max Sane M.D on 06/17/2017 at 7:44 PM  Between 7am to 6pm - Pager - [phone]  After 6pm  go to [email or web address] - Proofreader  [facility]  Office  [phone]  CC: Primary care physician; [name], MD  Note: This dictation was prepared with Dragon dictation along with smaller phrase technology. Any transcriptional errors that result from this process are  unintentional.

## 2017-06-17 NOTE — Progress Notes (Signed)
Pt up to [facility], complaining of burning when urinating, will inform MD

## 2017-06-17 NOTE — Progress Notes (Signed)
Pre hd assessment  

## 2017-06-17 NOTE — Progress Notes (Signed)
End of hd, goal met, no c/o, pt stable

## 2017-06-17 NOTE — Progress Notes (Addendum)
[facility]  ROUNDING NOTE   Subjective:   Seen and examined on hemodialysis. Tolerating treatment well.     HEMODIALYSIS FLOWSHEET:  Blood Flow Rate (mL/min): 350 mL/min Arterial Pressure (mmHg): -170 mmHg Venous Pressure (mmHg): 110 mmHg Transmembrane Pressure (mmHg): 70 mmHg Ultrafiltration Rate (mL/min): 1000 mL/min Dialysate Flow Rate (mL/min): 800 ml/min Conductivity: Machine : 13.5 Conductivity: Machine : 13.5 Dialysis Fluid Bolus: Normal Saline Bolus Amount (mL): 250 mL Dialysate Change: 2K    Objective:  Vital signs in last 24 hours:  Temp:  [98.4 F (36.9 C)-102.9 F (39.4 C)] 99.1 F (37.3 C) (07/23 1020) Pulse Rate:  [66-98] 73 (07/23 1230) Resp:  [16-23] 20 (07/23 1230) BP: (152-243)/(73-192) 168/76 (07/23 1230) SpO2:  [93 %-100 %] 96 % (07/23 1230) Weight:  [57.9 kg (127 lb 11.2 oz)-63.8 kg (140 lb 10.5 oz)] 59.5 kg (131 lb 2.8 oz) (07/23 1020)  Weight change: 4.833 kg (10 lb 10.5 oz) Filed Weights   06/16/17 2030 06/17/17 0500 06/17/17 1020  Weight: 57.9 kg (127 lb 11.2 oz) 58.3 kg (128 lb 9.6 oz) 59.5 kg (131 lb 2.8 oz)    Intake/Output: I/O last 3 completed shifts: In: 571.6 [P.O.:238; I.V.:333.6] Out: 2820 [Urine:820; Other:2000]   Intake/Output this shift:  Total I/O In: 50 [IV Piggyback:50] Out: 200 [Urine:200]  Physical Exam: General: NAD, laying in bed  Head: Normocephalic, atraumatic. Moist oral mucosal membranes  Eyes: Anicteric, PERRL  Neck: Supple, trachea midline  Lungs:  Clear to auscultation  Heart: Regular rate and rhythm  Abdomen:  Soft, nontender,   Extremities: no peripheral edema.  Neurologic: Nonfocal, moving all four extremities  Skin: No lesions  Access: Left AVF    Basic Metabolic Panel:  Recent Labs Lab 06/15/17 1127 06/16/17 1600 06/16/17 1604 06/17/17 1057  NA 139  --  136  --   K 5.4*  --  5.4*  --   CL 99*  --  99*  --   CO2 26  --  24  --   GLUCOSE 260*  --  114*  --   BUN 78*  --   88*  --   CREATININE 9.89*  --  10.61*  --   CALCIUM 7.3*  --  8.1*  --   PHOS  --  7.4*  --  6.3*    Liver Function Tests:  Recent Labs Lab 06/16/17 1604  AST 18  ALT 12*  ALKPHOS 153*  BILITOT 0.7  PROT 6.3*  ALBUMIN 3.2*   No results for input(s): LIPASE, AMYLASE in the last 168 hours. No results for input(s): AMMONIA in the last 168 hours.  CBC:  Recent Labs Lab 06/15/17 1127 06/16/17 1604  WBC 8.1 3.1*  HGB 8.8* 8.2*  HCT 26.8* 25.0*  MCV 103.4* 103.5*  PLT 284 251    Cardiac Enzymes: No results for input(s): CKTOTAL, CKMB, CKMBINDEX, TROPONINI in the last 168 hours.  BNP: Invalid input(s): POCBNP  CBG: No results for input(s): GLUCAP in the last 168 hours.  Microbiology: Results for orders placed or performed during the hospital encounter of 06/15/17  MRSA PCR Screening     Status: None   Collection Time: 06/16/17 12:32 AM  Result Value Ref Range Status   MRSA by PCR NEGATIVE NEGATIVE Final    Comment:        The GeneXpert MRSA Assay (FDA approved for NASAL specimens only), is one component of a comprehensive MRSA colonization surveillance program. It is not intended to diagnose MRSA infection nor  to guide or monitor treatment for MRSA infections.     Coagulation Studies: No results for input(s): LABPROT, INR in the last 72 hours.  Urinalysis:  Recent Labs  06/16/17 0032  COLORURINE YELLOW*  LABSPEC 1.006  PHURINE 8.0  GLUCOSEU 150*  HGBUR SMALL*  BILIRUBINUR NEGATIVE  KETONESUR NEGATIVE  PROTEINUR 30*  NITRITE NEGATIVE  LEUKOCYTESUR LARGE*      Imaging: Dg Chest 2 View  Result Date: 06/15/2017 CLINICAL DATA:  COPD, end-stage renal disease, fell this morning, lightheaded smoker, hypertension EXAM: CHEST  2 VIEW COMPARISON:  06/05/2017 FINDINGS: RIGHT jugular central venous catheter with tip projecting over SVC. Enlargement of cardiac silhouette with pulmonary vascular congestion. Tortuous aorta with atherosclerotic  calcification. Diffuse interstitial infiltrates likely pulmonary edema, question CHF versus fluid overload. Subsegmental atelectasis RIGHT base. No pleural effusion or pneumothorax. Bones demineralized. IMPRESSION: Enlargement of cardiac silhouette with pulmonary vascular congestion and diffuse interstitial edema question fluid overload versus CHF. Aortic Atherosclerosis (ICD10-I70.0). Electronically Signed   By: [name] M.D.   On: 06/15/2017 16:39   Ct Head Wo Contrast  Result Date: 06/15/2017 CLINICAL DATA:  [place] this morning, lightheaded EXAM: CT HEAD WITHOUT CONTRAST TECHNIQUE: Contiguous axial images were obtained from the base of the skull through the vertex without intravenous contrast. COMPARISON:  12/04/2016, 10/06/2016 MRI FINDINGS: Brain: No acute territorial infarction, hemorrhage, or intracranial mass is seen. Minimal atrophy. Mild hypodensity in the periventricular white matter consistent with small vessel ischemic changes. Nonenlarged ventricles. Vascular: No hyperdense vessels. Carotid artery and vertebral artery calcifications. Skull: No fracture or suspicious bone lesion. Sinuses/Orbits: No acute finding. Other: None IMPRESSION: No CT evidence for acute intracranial abnormality. Electronically Signed   By: Donavan Foil M.D.   On: 06/15/2017 16:32     Medications:   . sodium chloride Stopped (06/16/17 0258)  . cefTRIAXone (ROCEPHIN)  IV Stopped (06/17/17 1005)   . ALPRAZolam  0.5 mg Oral BID  . amLODipine  10 mg Oral Daily  . calcium acetate  1,334 mg Oral TID WC  . carvedilol  12.5 mg Oral BID WC  . ezetimibe  10 mg Oral Daily  . feeding supplement (PRO-STAT SUGAR FREE 64)  30 mL Oral Daily  . furosemide  40 mg Oral Q T,Th,S,Su  . heparin  5,000 Units Subcutaneous Q8H  . ipratropium-albuterol  3 mL Nebulization QID  . losartan  100 mg Oral Daily  . multivitamin  1 tablet Oral QHS  . pantoprazole  40 mg Oral BID  . patiromer  8.4 g Oral Daily  . sertraline  50 mg Oral  Daily  . sodium chloride flush  3 mL Intravenous Q12H   acetaminophen **OR** acetaminophen, albuterol, bisacodyl, calcium acetate, fluticasone, hydrALAZINE, HYDROcodone-acetaminophen, ondansetron **OR** ondansetron (ZOFRAN) IV, senna-docusate  Assessment/ Plan:  [name] is a 61 y.o. black female  with hypertension, hyperlipidemia, diastolic heart failure, hypertension, COPD, tobacco abuse, anemia, SHPTH, cocaine abuse  [place]. MWF   1. ESRD: seen and examined on hemodialysis. Tolerating treatment well. No complains.  - Continue MWF schedule.   2. Hypertension: elevated during treatment.  - IV hydralazine as needed.  - amlodipine, furosemide, losartan  3. Anemia of chronic kidney disease. - EPO with HD treatment  4. Secondary hyperparathyroidism: with hypocalcemia and hyperphosphatemia - Calcium acetate with meals.  - Holding cinacalcet due to hypocalcemia.    LOS: 0 Alban Marucci 7/23/20181:05 PM

## 2017-06-17 NOTE — Progress Notes (Signed)
Patient complained of generalized itching. Dr.Hugelmayer gave orders for an one time dose of benadryl.

## 2017-06-17 NOTE — Progress Notes (Signed)
Post hd vitals 

## 2017-06-17 NOTE — Progress Notes (Signed)
Hd start 

## 2017-06-17 NOTE — Plan of Care (Signed)
Problem: Pain Managment: Goal: General experience of comfort will improve Outcome: Progressing Prn medications  Problem: Physical Regulation: Goal: Will remain free from infection Outcome: Not Progressing + UTI, IV rocephin started today  Problem: Fluid Volume: Goal: Compliance with measures to maintain balanced fluid volume will improve Outcome: Progressing Daily weights, intake and output, Dialysis scheduled for today

## 2017-06-17 NOTE — Progress Notes (Signed)
Pt. back from dialysis

## 2017-06-18 ENCOUNTER — Inpatient Hospital Stay: Payer: Medicare Other

## 2017-06-18 DIAGNOSIS — R42 Dizziness and giddiness: Secondary | ICD-10-CM

## 2017-06-18 LAB — CBC
HCT: 26.9 % — ABNORMAL LOW (ref 35.0–47.0)
HEMOGLOBIN: 9 g/dL — AB (ref 12.0–16.0)
MCH: 34.3 pg — AB (ref 26.0–34.0)
MCHC: 33.6 g/dL (ref 32.0–36.0)
MCV: 101.9 fL — ABNORMAL HIGH (ref 80.0–100.0)
PLATELETS: 237 10*3/uL (ref 150–440)
RBC: 2.63 MIL/uL — AB (ref 3.80–5.20)
RDW: 14.8 % — ABNORMAL HIGH (ref 11.5–14.5)
WBC: 7.5 10*3/uL (ref 3.6–11.0)

## 2017-06-18 LAB — URINE CULTURE

## 2017-06-18 LAB — BASIC METABOLIC PANEL
ANION GAP: 14 (ref 5–15)
BUN: 39 mg/dL — AB (ref 6–20)
CHLORIDE: 94 mmol/L — AB (ref 101–111)
CO2: 29 mmol/L (ref 22–32)
Calcium: 8.2 mg/dL — ABNORMAL LOW (ref 8.9–10.3)
Creatinine, Ser: 5.93 mg/dL — ABNORMAL HIGH (ref 0.44–1.00)
GFR, EST AFRICAN AMERICAN: 8 mL/min — AB (ref >60)
GFR, EST NON AFRICAN AMERICAN: 7 mL/min — AB (ref >60)
Glucose, Bld: 119 mg/dL — ABNORMAL HIGH (ref 65–99)
POTASSIUM: 4.5 mmol/L (ref 3.5–5.1)
SODIUM: 137 mmol/L (ref 135–145)

## 2017-06-18 MED ORDER — GUAIFENESIN-DM 100-10 MG/5ML PO SYRP
5.0000 mL | ORAL_SOLUTION | ORAL | Status: DC | PRN
Start: 2017-06-18 — End: 2017-06-20
  Administered 2017-06-18: 5 mL via ORAL
  Filled 2017-06-18: qty 5

## 2017-06-18 MED ORDER — HYDRALAZINE HCL 25 MG PO TABS
25.0000 mg | ORAL_TABLET | Freq: Four times a day (QID) | ORAL | Status: DC
  Administered 2017-06-18 – 2017-06-20 (×8): 25 mg via ORAL
  Filled 2017-06-18 (×8): qty 1

## 2017-06-18 MED ORDER — HYDROCODONE-ACETAMINOPHEN 5-325 MG PO TABS: 1.0000 | ORAL_TABLET | Freq: Four times a day (QID) | ORAL | Status: DC | PRN

## 2017-06-18 MED ORDER — HYDROCODONE-ACETAMINOPHEN 5-325 MG PO TABS
1.0000 | ORAL_TABLET | Freq: Four times a day (QID) | ORAL | Status: DC | PRN
  Administered 2017-06-18 – 2017-06-19 (×2): 1 via ORAL
  Filled 2017-06-18 (×2): qty 1

## 2017-06-18 MED ORDER — IBUPROFEN 400 MG PO TABS
400.0000 mg | ORAL_TABLET | Freq: Four times a day (QID) | ORAL | Status: DC | PRN
Start: 2017-06-18 — End: 2017-06-18
  Administered 2017-06-18: 400 mg via ORAL

## 2017-06-18 NOTE — Progress Notes (Signed)
[place] at [place] NAME: Michele Nash    MR#:  [id number]  DATE OF BIRTH:  1956-01-03  SUBJECTIVE:  CHIEF COMPLAINT:   Chief Complaint  Patient presents with  . Fall  . Weakness  c/o b/l ear pains, chest pains and new onset hemoptysis REVIEW OF SYSTEMS:  Review of Systems  Constitutional: Positive for malaise/fatigue. Negative for chills, fever and weight loss.  HENT: Positive for ear pain. Negative for nosebleeds and sore throat.   Eyes: Negative for blurred vision.  Respiratory: Positive for hemoptysis. Negative for cough, shortness of breath and wheezing.   Cardiovascular: Positive for chest pain. Negative for orthopnea, leg swelling and PND.  Gastrointestinal: Negative for abdominal pain, constipation, diarrhea, heartburn, nausea and vomiting.  Genitourinary: Negative for dysuria and urgency.  Musculoskeletal: Negative for back pain.  Skin: Negative for rash.  Neurological: Positive for weakness. Negative for dizziness, speech change, focal weakness and headaches.  Endo/Heme/Allergies: Does not bruise/bleed easily.  Psychiatric/Behavioral: Negative for depression.   DRUG ALLERGIES:   Allergies  Allergen Reactions  . Chantix [Varenicline]     hallucinations  . Sulfa Antibiotics Itching, Swelling, Rash and Other (See Comments)    Reaction:  Facial/body swelling    VITALS:  Blood pressure 137/75, pulse 74, temperature 97.7 F (36.5 C), temperature source Oral, resp. rate 20, height 5\' 5"  (1.651 m), weight 58.2 kg (128 lb 4.8 oz), SpO2 100 %. PHYSICAL EXAMINATION:  Physical Exam  Constitutional: She appears lethargic and malnourished.  HENT:  Head: Normocephalic and atraumatic.  I checked her ears with otoscope and looks clean  Eyes: Pupils are equal, round, and reactive to light. Conjunctivae and EOM are normal.  Neck: Normal range of motion. Neck supple. No tracheal deviation present. No thyromegaly present.    Cardiovascular: Normal rate, regular rhythm and normal heart sounds.   Pulmonary/Chest: Effort normal and breath sounds normal. No respiratory distress. She has no wheezes. She exhibits no tenderness.  Abdominal: Soft. Bowel sounds are normal. She exhibits no distension. There is no tenderness.  Musculoskeletal: Normal range of motion.  Neurological: She appears lethargic. No cranial nerve deficit.  Difficult to evaluate due to her mental state  Skin: Skin is warm and dry. No rash noted.  Psychiatric: Mood and affect normal. She exhibits disordered thought content.  sleepy   LABORATORY PANEL:  Female CBC  Recent Labs Lab 06/18/17 0635  WBC 7.5  HGB 9.0*  HCT 26.9*  PLT 237   ------------------------------------------------------------------------------------------------------------------ Chemistries   Recent Labs Lab 06/16/17 1604 06/18/17 0635  NA 136 137  K 5.4* 4.5  CL 99* 94*  CO2 24 29  GLUCOSE 114* 119*  BUN 88* 39*  CREATININE 10.61* 5.93*  CALCIUM 8.1* 8.2*  AST 18  --   ALT 12*  --   ALKPHOS 153*  --   BILITOT 0.7  --    RADIOLOGY:  Ct Chest Wo Contrast  Result Date: 06/18/2017 CLINICAL DATA:  Chest pain, hemoptysis. EXAM: CT CHEST WITHOUT CONTRAST TECHNIQUE: Multidetector CT imaging of the chest was performed following the standard protocol without IV contrast. COMPARISON:  CT scan of December 04, 2016. FINDINGS: Cardiovascular: Atherosclerosis of thoracic aorta is noted. 4.2 cm ascending thoracic aortic aneurysm is noted. Mild cardiomegaly is noted. Coronary artery calcifications are noted. No pericardial effusion is noted. Right internal jugular dialysis catheter is noted. Mediastinum/Nodes: No enlarged mediastinal or axillary lymph nodes. Trachea and esophagus demonstrate no significant findings. Stable calcified thyroid nodules  are noted. Lungs/Pleura: No pneumothorax or pleural effusion is noted. There is interval development of reticular and airspace  opacities involving the upper and lower lobes bilaterally which may represent acute inflammation or edema. Mild emphysematous disease is noted in the upper lobes. Upper Abdomen: Bilateral renal atrophy is noted consistent with history of end-stage renal disease. Stable hyperdense and hypodense cysts are noted. Musculoskeletal: Diffuse sclerosis of visualized skeleton is noted consistent with renal osteodystrophy. Stable old right rib fractures are noted. IMPRESSION: Coronary calcifications are noted suggesting coronary artery disease. 4.2 cm ascending thoracic aortic aneurysm. Recommend annual imaging followup by CTA or MRA. This recommendation follows 2010 ACCF/AHA/AATS/ACR/ASA/SCA/SCAI/SIR/STS/SVM Guidelines for the Diagnosis and Management of Patients with Thoracic Aortic Disease. Circulation. 2010; 121: G920-F007. Bilateral renal atrophy consistent with history of end-stage renal disease. Interval development of reticular and airspace opacities involving the upper and lower lobes bilaterally which may represent acute pulmonary inflammation or edema. Aortic Atherosclerosis (ICD10-I70.0) and Emphysema (ICD10-J43.9). Electronically Signed   By: Marijo Conception, M.D.   On: 06/18/2017 10:41   ASSESSMENT AND PLAN:  61 year old female with past medical history of end-stage renal disease on hemodialysis, hypertension, hyperlipidemia, previous history of GI bleed, COPD, obstructive sleep apnea, depression who presented to the hospital due to falls/dizziness.  * Dizziness/falls-etiology unclear presently. Patient's orthostatic vital signs are negative. -Her CT head was negative for acute pathology.   * hemoptysis - new symptom - will get CT chest - pulmo c/s - could be from dry O2 and coughing  * UTI: based on UA, await urine c/s - start rocephin  * End-stage renal disease on hemodialysis-HD per nephro  * Uncontrolled HTN - continue Coreg, norvasc,cozaar - increase hydralazine to 25 mg PO qid  *  Secondary hyperparathyroidism-continuePhosLo.  * History of anxiety-continue Xanax.  * Depression-continue her Zoloft.   * GERD -  cont.  Protonix.      All the records are reviewed and case discussed with Care Management/Social Worker. Management plans discussed with the patient, nursing and they are in agreement.  CODE STATUS: Full Code  TOTAL TIME TAKING CARE OF THIS PATIENT: 25 minutes.   More than 50% of the time was spent in counseling/coordination of care: YES  POSSIBLE D/C IN 1-2 DAYS, DEPENDING ON CLINICAL CONDITION.   Max Sane M.D on 06/18/2017 at 6:30 PM  Between 7am to 6pm - Pager - [phone]  After 6pm go to [email or web address] - Proofreader  [facility]  Office  [phone]  CC: Primary care physician; [name], MD  Note: This dictation was prepared with Dragon dictation along with smaller phrase technology. Any transcriptional errors that result from this process are unintentional.

## 2017-06-18 NOTE — Consult Note (Signed)
[facility] Consultation      Assessment and Plan:  Acute hemoptysis.  --Does not appear severe. May be secondary to severe COPD and airway drying from [place] oxygen.  --Asked RN to have respiratory add humidification, which may help. No evidence of nasal bleeding.   Chronic respiratory failure.  --Appears controlled, continue baseline regimen for COPD.  --Advance activity as tolerated.   Date: 06/18/2017  MRN# [id number] Michele Nash 27-Jun-1956  Referring Physician: Dr. Manuella Ghazi for weakness.   Michele Nash is a 61 y.o. old female seen in consultation for chief complaint of:    Chief Complaint  Patient presents with  . Fall  . Weakness    HPI:   The patient is a 61 yo female with ESRD, severe COPD, with several hospital admissions. She was admitted to the hospital on 06/15/17 after a fall. She had improved and was planned for discharge on 7/22 but then had to be kept in the hospital due to severe lethargy.  We are consulted for hemoptysis. Currently the patient can give little history due to lethargy, she thinks that it started yesterday. She motions to a cup at the bedside with some tissue paper inside, this contains some old looking blood that does not appear bright red.     PMHX:   Past Medical History:  Diagnosis Date  . Altered mental status   . Anemia   . Apnea, sleep    for sleep study 10/17/15-no cpap yet  . Arthritis   . Bronchitis   . Chronic kidney disease   . COPD (chronic obstructive pulmonary disease) ([place])   . Depression   . Dialysis patient ([place]) 2014  . GERD (gastroesophageal reflux disease)   . GIB (gastrointestinal bleeding) 07/09/2016  . Headache   . Hyperlipidemia   . Hypertension   . Obesity   . Pulmonary edema 09/14/2016  . Renal dialysis device, implant, or graft complication    RIGHT CHEST CATH  . Respiratory failure ([place]) 07/23/2016  . Traumatic hematoma of forehead   . Type II diabetes mellitus ([place]) 09/21/2009   diet  controlled   Surgical Hx:  Past Surgical History:  Procedure Laterality Date  . ABDOMINAL HYSTERECTOMY    . COLONOSCOPY  2011  . COLONOSCOPY WITH PROPOFOL N/A 09/20/2015   Procedure: COLONOSCOPY WITH PROPOFOL;  Surgeon: Lucilla Lame, MD;  Location: [facility] ENDOSCOPY;  Service: Endoscopy;  Laterality: N/A;  . DIALYSIS FISTULA CREATION    . ESOPHAGOGASTRODUODENOSCOPY N/A 07/13/2016   Procedure: ESOPHAGOGASTRODUODENOSCOPY (EGD);  Surgeon: Lollie Sails, MD;  Location[facility] ENDOSCOPY;  Service: Endoscopy;  Laterality: N/A;  . ESOPHAGOGASTRODUODENOSCOPY (EGD) WITH PROPOFOL N/A 06/02/2016   Procedure: ESOPHAGOGASTRODUODENOSCOPY (EGD) WITH PROPOFOL;  Surgeon: Manya Silvas, MD;  Location: [facility] ENDOSCOPY;  Service: Endoscopy;  Laterality: N/A;  . LIGATION OF ARTERIOVENOUS  FISTULA Left 05/24/2017   Procedure: LIGATION OF ARTERIOVENOUS  FISTULA ( PERMCATH INSERTION );  Surgeon: Katha Cabal, MD;  Location: [facility] ORS;  Service: Vascular;  Laterality: Left;  . PERIPHERAL VASCULAR CATHETERIZATION N/A 05/10/2015   Procedure: A/V Shuntogram/Fistulagram;  Surgeon: Katha Cabal, MD;  Location: [place] CV LAB;  Service: Cardiovascular;  Laterality: N/A;  . PERIPHERAL VASCULAR CATHETERIZATION Left 05/10/2015   Procedure: A/V Shunt Intervention;  Surgeon: Katha Cabal, MD;  Location: [place] CV LAB;  Service: Cardiovascular;  Laterality: Left;  . PERIPHERAL VASCULAR CATHETERIZATION Left 08/23/2015   Procedure: A/V Shuntogram/Fistulagram;  Surgeon: Katha Cabal, MD;  Location: [facility] INVASIVE CV  LAB;  Service: Cardiovascular;  Laterality: Left;  . PERIPHERAL VASCULAR CATHETERIZATION N/A 08/23/2015   Procedure: A/V Shunt Intervention;  Surgeon: Katha Cabal, MD;  Location: [place] CV LAB;  Service: Cardiovascular;  Laterality: N/A;  . PERIPHERAL VASCULAR CATHETERIZATION  08/23/2015   Procedure: Dialysis/Perma Catheter Insertion;  Surgeon: Katha Cabal, MD;  Location: [place] CV LAB;  Service: Cardiovascular;;  . PERIPHERAL VASCULAR CATHETERIZATION N/A 01/03/2016   Procedure: Dialysis/Perma Catheter Removal;  Surgeon: Katha Cabal, MD;  Location: [place] CV LAB;  Service: Cardiovascular;  Laterality: N/A;  . REVISON OF ARTERIOVENOUS FISTULA Left 10/28/2015   Procedure: REVISON OF ARTERIOVENOUS FISTULA WITH ARTEGRAFT;  Surgeon: Katha Cabal, MD;  Location: [facility] ORS;  Service: Vascular;  Laterality: Left;  . WOUND DEBRIDEMENT Left 10/28/2015   Procedure: Resection of shoulder cyst ( left );  Surgeon: Katha Cabal, MD;  Location: [facility] ORS;  Service: Vascular;  Laterality: Left;   Family Hx:  Family History  Problem Relation Age of Onset  . Heart disease Mother   . Cancer Father   . Cancer Sister    Social Hx:   Social History  Substance Use Topics  . Smoking status: Light Tobacco Smoker    Years: 40.00    Types: Cigarettes  . Smokeless tobacco: Never Used  . Alcohol use No   Medication:    Current Facility-Administered Medications:  .  0.9 %  sodium chloride infusion, , Intravenous, Continuous, Sparks, Leonie Douglas, MD, Stopped at 06/16/17 0258 .  acetaminophen (TYLENOL) tablet 650 mg, 650 mg, Oral, Q6H PRN, 650 mg at 06/18/17 0527 **OR** acetaminophen (TYLENOL) suppository 650 mg, 650 mg, Rectal, Q6H PRN, Idelle Crouch, MD .  albuterol (PROVENTIL) (2.5 MG/3ML) 0.083% nebulizer solution 2.5 mg, 2.5 mg, Nebulization, Q6H PRN, Idelle Crouch, MD .  amLODipine (NORVASC) tablet 10 mg, 10 mg, Oral, Daily, Idelle Crouch, MD, 10 mg at 06/18/17 0859 .  bisacodyl (DULCOLAX) suppository 10 mg, 10 mg, Rectal, Daily PRN, Idelle Crouch, MD .  calcium acetate (PHOSLO) capsule 2,001 mg, 2,001 mg, Oral, TID WC, Kolluru, Sarath, MD, 2,001 mg at 06/18/17 0858 .  calcium acetate (PHOSLO) capsule 667 mg, 667 mg, Oral, QHS PRN, Idelle Crouch, MD .  carvedilol (COREG) tablet 12.5 mg, 12.5 mg, Oral, BID WC, Idelle Crouch, MD, 12.5 mg  at 06/18/17 0858 .  cefTRIAXone (ROCEPHIN) 1 g in dextrose 5 % 50 mL IVPB, 1 g, Intravenous, Q24H, Max Sane, MD, Stopped at 06/18/17 [phone] .  [START ON 06/19/2017] epoetin alfa (EPOGEN,PROCRIT) injection 10,000 Units, 10,000 Units, Intravenous, Q M,W,F-HD, Kolluru, Sarath, MD, 10,000 Units at 06/17/17 1332 .  ezetimibe (ZETIA) tablet 10 mg, 10 mg, Oral, Daily, Idelle Crouch, MD, 10 mg at 06/18/17 0858 .  feeding supplement (PRO-STAT SUGAR FREE 64) liquid 30 mL, 30 mL, Oral, Daily, Idelle Crouch, MD, 30 mL at 06/18/17 0904 .  fluticasone (FLONASE) 50 MCG/ACT nasal spray 2 spray, 2 spray, Each Nare, Daily PRN, Idelle Crouch, MD, 2 spray at 06/16/17 1508 .  furosemide (LASIX) tablet 40 mg, 40 mg, Oral, Q T,Th,S,Su, Idelle Crouch, MD, 40 mg at 06/18/17 0858 .  hydrALAZINE (APRESOLINE) injection 10 mg, 10 mg, Intravenous, Q6H PRN, Idelle Crouch, MD, 10 mg at 06/17/17 0448 .  hydrALAZINE (APRESOLINE) tablet 25 mg, 25 mg, Oral, Q6H, Manuella Ghazi, Vipul, MD .  HYDROcodone-acetaminophen (NORCO/VICODIN) 5-325 MG per tablet 1 tablet, 1 tablet, Oral, Q6H PRN, Max Sane, MD .  ibuprofen (ADVIL,MOTRIN) tablet 400 mg, 400 mg, Oral, Q6H PRN, Max Sane, MD, 400 mg at 06/18/17 0900 .  ipratropium-albuterol (DUONEB) 0.5-2.5 (3) MG/3ML nebulizer solution 3 mL, 3 mL, Nebulization, QID, Sparks, Leonie Douglas, MD, 3 mL at 06/18/17 0803 .  losartan (COZAAR) tablet 100 mg, 100 mg, Oral, Daily, Idelle Crouch, MD, 100 mg at 06/18/17 0859 .  multivitamin (RENA-VIT) tablet 1 tablet, 1 tablet, Oral, QHS, Idelle Crouch, MD, 1 tablet at 06/17/17 2153 .  ondansetron (ZOFRAN) tablet 4 mg, 4 mg, Oral, Q6H PRN **OR** ondansetron (ZOFRAN) injection 4 mg, 4 mg, Intravenous, Q6H PRN, Idelle Crouch, MD, 4 mg at 06/16/17 0259 .  pantoprazole (PROTONIX) EC tablet 40 mg, 40 mg, Oral, BID, Idelle Crouch, MD, 40 mg at 06/18/17 0859 .  senna-docusate (Senokot-S) tablet 1 tablet, 1 tablet, Oral, QHS PRN, Idelle Crouch, MD .  sertraline (ZOLOFT) tablet 50 mg, 50 mg, Oral, Daily, Idelle Crouch, MD, 50 mg at 06/18/17 0859 .  sodium chloride flush (NS) 0.9 % injection 3 mL, 3 mL, Intravenous, Q12H, Manuella Ghazi, Vipul, MD, 3 mL at 06/18/17 0900   Allergies:  Chantix [varenicline] and Sulfa antibiotics  Review of Systems: Could not be obtained due to lethargy.   Physical Examination:   VS: BP (!) 167/72   Pulse 81   Temp 97.7 F (36.5 C) (Oral)   Resp 18   Ht 5\' 5"  (1.651 m)   Wt 124 lb 3.2 oz (56.3 kg)   SpO2 96%   BMI 20.67 kg/m   General Appearance: No distress, l;ethargic.  Neuro:without focal findings,  speech slurred.  HEENT: PERRLA, EOM intact.  No blood seen in nares or oral cavity.  Pulmonary: normal breath sounds, No wheezing.  CardiovascularNormal S1,S2.  No m/r/g.   Abdomen: Benign, Soft, non-tender. Renal:  No costovertebral tenderness  GU:  No performed at this time. Endoc: No evident thyromegaly, no signs of acromegaly. Skin:   warm, no rashes, no ecchymosis  Extremities: normal, no cyanosis, clubbing.  Other findings:    LABORATORY PANEL:   CBC  Recent Labs Lab 06/18/17 0635  WBC 7.5  HGB 9.0*  HCT 26.9*  PLT 237   ------------------------------------------------------------------------------------------------------------------  Chemistries   Recent Labs Lab 06/16/17 1604 06/18/17 0635  NA 136 137  K 5.4* 4.5  CL 99* 94*  CO2 24 29  GLUCOSE 114* 119*  BUN 88* 39*  CREATININE 10.61* 5.93*  CALCIUM 8.1* 8.2*  AST 18  --   ALT 12*  --   ALKPHOS 153*  --   BILITOT 0.7  --    ------------------------------------------------------------------------------------------------------------------  Cardiac Enzymes No results for input(s): TROPONINI in the last 168 hours. ------------------------------------------------------------  RADIOLOGY:  Ct Chest Wo Contrast  Result Date: 06/18/2017 CLINICAL DATA:  Chest pain, hemoptysis. EXAM: CT CHEST  WITHOUT CONTRAST TECHNIQUE: Multidetector CT imaging of the chest was performed following the standard protocol without IV contrast. COMPARISON:  CT scan of December 04, 2016. FINDINGS: Cardiovascular: Atherosclerosis of thoracic aorta is noted. 4.2 cm ascending thoracic aortic aneurysm is noted. Mild cardiomegaly is noted. Coronary artery calcifications are noted. No pericardial effusion is noted. Right internal jugular dialysis catheter is noted. Mediastinum/Nodes: No enlarged mediastinal or axillary lymph nodes. Trachea and esophagus demonstrate no significant findings. Stable calcified thyroid nodules are noted. Lungs/Pleura: No pneumothorax or pleural effusion is noted. There is interval development of reticular and airspace opacities involving the upper and lower lobes bilaterally which may represent acute inflammation or edema.  Mild emphysematous disease is noted in the upper lobes. Upper Abdomen: Bilateral renal atrophy is noted consistent with history of end-stage renal disease. Stable hyperdense and hypodense cysts are noted. Musculoskeletal: Diffuse sclerosis of visualized skeleton is noted consistent with renal osteodystrophy. Stable old right rib fractures are noted. IMPRESSION: Coronary calcifications are noted suggesting coronary artery disease. 4.2 cm ascending thoracic aortic aneurysm. Recommend annual imaging followup by CTA or MRA. This recommendation follows 2010 ACCF/AHA/AATS/ACR/ASA/SCA/SCAI/SIR/STS/SVM Guidelines for the Diagnosis and Management of Patients with Thoracic Aortic Disease. Circulation. 2010; 121: Q595-G387. Bilateral renal atrophy consistent with history of end-stage renal disease. Interval development of reticular and airspace opacities involving the upper and lower lobes bilaterally which may represent acute pulmonary inflammation or edema. Aortic Atherosclerosis (ICD10-I70.0) and Emphysema (ICD10-J43.9). Electronically Signed   By: Marijo Conception, M.D.   On: 06/18/2017 10:41        Thank  you for the consultation and for allowing [facility] to assist in the care of your patient. Our recommendations are noted above.  Please contact us if we can be of further service.   Marda Stalker, MD.  Board Certified in Internal Medicine, Pulmonary Medicine, [place], and Sleep Medicine.  [facility] Office Number: [phone]  Patricia Pesa, M.D.  Merton Border, M.D  06/18/2017

## 2017-06-18 NOTE — Progress Notes (Signed)
[facility]  ROUNDING NOTE   Subjective:   Afebrile  Hemodialysis treatment yesterday. Tolerated treatment well. UF of 2.5 litres.   hgb 9  ceftriaxone   Objective:  Vital signs in last 24 hours:  Temp:  [97.7 F (36.5 C)-99.7 F (37.6 C)] 97.7 F (36.5 C) (07/24 0808) Pulse Rate:  [60-88] 81 (07/24 0854) Resp:  [16-23] 18 (07/24 0854) BP: (121-243)/(54-113) 167/72 (07/24 0854) SpO2:  [93 %-100 %] 96 % (07/24 0854) Weight:  [56.3 kg (124 lb 3.2 oz)-59.5 kg (131 lb 2.8 oz)] 56.3 kg (124 lb 3.2 oz) (07/24 0443)  Weight change: -4.3 kg (-9 lb 7.7 oz) Filed Weights   06/17/17 0500 06/17/17 1020 06/18/17 0443  Weight: 58.3 kg (128 lb 9.6 oz) 59.5 kg (131 lb 2.8 oz) 56.3 kg (124 lb 3.2 oz)    Intake/Output: I/O last 3 completed shifts: In: 68 [IV Piggyback:50] Out: 2700 [Urine:200; Other:2500]   Intake/Output this shift:  No intake/output data recorded.  Physical Exam: General: NAD, sitting in bed  Head: Normocephalic, atraumatic. Moist oral mucosal membranes  Eyes: Anicteric, PERRL  Neck: Supple, trachea midline  Lungs:  Clear to auscultation  Heart: Regular rate and rhythm  Abdomen:  Soft, nontender,   Extremities: no peripheral edema.  Neurologic: Nonfocal, moving all four extremities  Skin: No lesions  Access: Left AVF    Basic Metabolic Panel:  Recent Labs Lab 06/15/17 1127 06/16/17 1600 06/16/17 1604 06/17/17 1057 06/18/17 0635  NA 139  --  136  --  137  K 5.4*  --  5.4*  --  4.5  CL 99*  --  99*  --  94*  CO2 26  --  24  --  29  GLUCOSE 260*  --  114*  --  119*  BUN 78*  --  88*  --  39*  CREATININE 9.89*  --  10.61*  --  5.93*  CALCIUM 7.3*  --  8.1*  --  8.2*  PHOS  --  7.4*  --  6.3*  --     Liver Function Tests:  Recent Labs Lab 06/16/17 1604  AST 18  ALT 12*  ALKPHOS 153*  BILITOT 0.7  PROT 6.3*  ALBUMIN 3.2*   No results for input(s): LIPASE, AMYLASE in the last 168 hours. No results for input(s): AMMONIA in  the last 168 hours.  CBC:  Recent Labs Lab 06/15/17 1127 06/16/17 1604 06/18/17 0635  WBC 8.1 3.1* 7.5  HGB 8.8* 8.2* 9.0*  HCT 26.8* 25.0* 26.9*  MCV 103.4* 103.5* 101.9*  PLT 284 251 237    Cardiac Enzymes: No results for input(s): CKTOTAL, CKMB, CKMBINDEX, TROPONINI in the last 168 hours.  BNP: Invalid input(s): POCBNP  CBG: No results for input(s): GLUCAP in the last 168 hours.  Microbiology: Results for orders placed or performed during the hospital encounter of 06/15/17  MRSA PCR Screening     Status: None   Collection Time: 06/16/17 12:32 AM  Result Value Ref Range Status   MRSA by PCR NEGATIVE NEGATIVE Final    Comment:        The GeneXpert MRSA Assay (FDA approved for NASAL specimens only), is one component of a comprehensive MRSA colonization surveillance program. It is not intended to diagnose MRSA infection nor to guide or monitor treatment for MRSA infections.     Coagulation Studies: No results for input(s): LABPROT, INR in the last 72 hours.  Urinalysis:  Recent Labs  06/16/17 0032  COLORURINE YELLOW*  LABSPEC 1.006  PHURINE 8.0  GLUCOSEU 150*  HGBUR SMALL*  BILIRUBINUR NEGATIVE  KETONESUR NEGATIVE  PROTEINUR 30*  NITRITE NEGATIVE  LEUKOCYTESUR LARGE*      Imaging: No results found.   Medications:   . sodium chloride Stopped (06/16/17 0258)  . cefTRIAXone (ROCEPHIN)  IV 1 g (06/18/17 0904)   . ALPRAZolam  0.5 mg Oral TID  . amLODipine  10 mg Oral Daily  . calcium acetate  2,001 mg Oral TID WC  . carvedilol  12.5 mg Oral BID WC  . [START ON 06/19/2017] epoetin (EPOGEN/PROCRIT) injection  10,000 Units Intravenous Q M,W,F-HD  . ezetimibe  10 mg Oral Daily  . feeding supplement (PRO-STAT SUGAR FREE 64)  30 mL Oral Daily  . furosemide  40 mg Oral Q T,Th,S,Su  . hydrALAZINE  25 mg Oral Q6H  . ipratropium-albuterol  3 mL Nebulization QID  . losartan  100 mg Oral Daily  . multivitamin  1 tablet Oral QHS  . pantoprazole  40 mg  Oral BID  . sertraline  50 mg Oral Daily  . sodium chloride flush  3 mL Intravenous Q12H   acetaminophen **OR** acetaminophen, albuterol, bisacodyl, calcium acetate, fluticasone, hydrALAZINE, HYDROcodone-acetaminophen, ibuprofen, ondansetron **OR** ondansetron (ZOFRAN) IV, senna-docusate  Assessment/ Plan:  [name] is a 61 y.o. black female  with hypertension, hyperlipidemia, diastolic heart failure, hypertension, COPD, tobacco abuse, anemia, SHPTH, cocaine abuse  [place]. MWF   1. ESRD: hemodialysis treatment yesterday. Tolerated treatment well.   - Continue MWF schedule.   2. Hypertension: elevated - IV hydralazine as needed.  - amlodipine, furosemide, losartan, carvedilol  3. Anemia of chronic kidney disease. - EPO with HD treatment  4. Secondary hyperparathyroidism: with hypocalcemia and hyperphosphatemia - Calcium acetate with meals.  - Holding cinacalcet due to hypocalcemia.   5. Urinary Tract Infection: empiric ceftriaxone.    LOS: [place], Michele Nash 7/24/20189:53 AM

## 2017-06-19 LAB — CBC
HEMATOCRIT: 27 % — AB (ref 35.0–47.0)
Hemoglobin: 8.9 g/dL — ABNORMAL LOW (ref 12.0–16.0)
MCH: 34.4 pg — ABNORMAL HIGH (ref 26.0–34.0)
MCHC: 33 g/dL (ref 32.0–36.0)
MCV: 104.2 fL — ABNORMAL HIGH (ref 80.0–100.0)
PLATELETS: 221 10*3/uL (ref 150–440)
RBC: 2.59 MIL/uL — ABNORMAL LOW (ref 3.80–5.20)
RDW: 14.3 % (ref 11.5–14.5)
WBC: 9.5 10*3/uL (ref 3.6–11.0)

## 2017-06-19 LAB — BASIC METABOLIC PANEL
ANION GAP: 14 (ref 5–15)
BUN: 59 mg/dL — ABNORMAL HIGH (ref 6–20)
CALCIUM: 8.1 mg/dL — AB (ref 8.9–10.3)
CO2: 28 mmol/L (ref 22–32)
CREATININE: 7.7 mg/dL — AB (ref 0.44–1.00)
Chloride: 95 mmol/L — ABNORMAL LOW (ref 101–111)
GFR, EST AFRICAN AMERICAN: 6 mL/min — AB (ref >60)
GFR, EST NON AFRICAN AMERICAN: 5 mL/min — AB (ref >60)
Glucose, Bld: 142 mg/dL — ABNORMAL HIGH (ref 65–99)
Potassium: 4.6 mmol/L (ref 3.5–5.1)
SODIUM: 137 mmol/L (ref 135–145)

## 2017-06-19 MED ORDER — MAGNESIUM SULFATE 2 GM/50ML IV SOLN
2.0000 g | Freq: Once | INTRAVENOUS | Status: AC
  Administered 2017-06-19: 2 g via INTRAVENOUS
  Filled 2017-06-19: qty 50

## 2017-06-19 MED ORDER — DEXAMETHASONE SODIUM PHOSPHATE 10 MG/ML IJ SOLN
10.0000 mg | Freq: Once | INTRAMUSCULAR | Status: AC
  Administered 2017-06-19: 10 mg via INTRAVENOUS
  Filled 2017-06-19: qty 1

## 2017-06-19 NOTE — Progress Notes (Signed)
Post hd vitals 

## 2017-06-19 NOTE — Progress Notes (Signed)
[facility]  ROUNDING NOTE   Subjective:   Seen and examined on hemodialysis. Tolerating treatment well. Using RIJ permcath. UF goal 2 litres.     HEMODIALYSIS FLOWSHEET:  Blood Flow Rate (mL/min): 400 mL/min Arterial Pressure (mmHg): -160 mmHg Venous Pressure (mmHg): 140 mmHg Transmembrane Pressure (mmHg): 60 mmHg Ultrafiltration Rate (mL/min): 710 mL/min Dialysate Flow Rate (mL/min): 600 ml/min Conductivity: Machine : 13.8 Conductivity: Machine : 13.8 Dialysis Fluid Bolus: Normal Saline Bolus Amount (mL): 250 mL Dialysate Change:  (3k 2.5ca)    Objective:  Vital signs in last 24 hours:  Temp:  [98.3 F (36.8 C)-98.7 F (37.1 C)] 98.3 F (36.8 C) (07/25 0854) Pulse Rate:  [62-76] 66 (07/25 0859) Resp:  [15-20] 16 (07/25 0859) BP: (124-179)/(70-98) 179/98 (07/25 0859) SpO2:  [96 %-100 %] 98 % (07/25 0859) Weight:  [57.1 kg (125 lb 12.8 oz)-59 kg (130 lb)] 59 kg (130 lb) (07/25 0854)  Weight change: -1.304 kg (-2 lb 14 oz) Filed Weights   06/18/17 1242 06/19/17 0358 06/19/17 0854  Weight: 58.2 kg (128 lb 4.8 oz) 57.1 kg (125 lb 12.8 oz) 59 kg (130 lb)    Intake/Output: I/O last 3 completed shifts: In: 360 [P.O.:360] Out: 250 [Urine:250]   Intake/Output this shift:  No intake/output data recorded.  Physical Exam: General: NAD, sitting in bed  Head: Normocephalic, atraumatic. Moist oral mucosal membranes  Eyes: Anicteric, PERRL  Neck: Supple, trachea midline  Lungs:  Clear to auscultation  Heart: Regular rate and rhythm  Abdomen:  Soft, nontender,   Extremities: no peripheral edema.  Neurologic: Nonfocal, moving all four extremities  Skin: No lesions  Access: Left AVF, RIJ permcath    Basic Metabolic Panel:  Recent Labs Lab 06/15/17 1127 06/16/17 1600 06/16/17 1604 06/17/17 1057 06/18/17 0635 06/19/17 0440  NA 139  --  136  --  137 137  K 5.4*  --  5.4*  --  4.5 4.6  CL 99*  --  99*  --  94* 95*  CO2 26  --  24  --  29 28   GLUCOSE 260*  --  114*  --  119* 142*  BUN 78*  --  88*  --  39* 59*  CREATININE 9.89*  --  10.61*  --  5.93* 7.70*  CALCIUM 7.3*  --  8.1*  --  8.2* 8.1*  PHOS  --  7.4*  --  6.3*  --   --     Liver Function Tests:  Recent Labs Lab 06/16/17 1604  AST 18  ALT 12*  ALKPHOS 153*  BILITOT 0.7  PROT 6.3*  ALBUMIN 3.2*   No results for input(s): LIPASE, AMYLASE in the last 168 hours. No results for input(s): AMMONIA in the last 168 hours.  CBC:  Recent Labs Lab 06/15/17 1127 06/16/17 1604 06/18/17 0635 06/19/17 0440  WBC 8.1 3.1* 7.5 9.5  HGB 8.8* 8.2* 9.0* 8.9*  HCT 26.8* 25.0* 26.9* 27.0*  MCV 103.4* 103.5* 101.9* 104.2*  PLT 284 251 237 221    Cardiac Enzymes: No results for input(s): CKTOTAL, CKMB, CKMBINDEX, TROPONINI in the last 168 hours.  BNP: Invalid input(s): POCBNP  CBG: No results for input(s): GLUCAP in the last 168 hours.  Microbiology: Results for orders placed or performed during the hospital encounter of 06/15/17  MRSA PCR Screening     Status: None   Collection Time: 06/16/17 12:32 AM  Result Value Ref Range Status   MRSA by PCR NEGATIVE NEGATIVE Final  Comment:        The GeneXpert MRSA Assay (FDA approved for NASAL specimens only), is one component of a comprehensive MRSA colonization surveillance program. It is not intended to diagnose MRSA infection nor to guide or monitor treatment for MRSA infections.   Urine Culture     Status: Abnormal   Collection Time: 06/16/17 12:32 AM  Result Value Ref Range Status   Specimen Description URINE, RANDOM  Final   Special Requests NONE  Final   Culture (A)  Final    <10,000 COLONIES/mL INSIGNIFICANT GROWTH Performed at [facility], [place] [address], [place], [place] [address]    Report Status 06/18/2017 FINAL  Final    Coagulation Studies: No results for input(s): LABPROT, INR in the last 72 hours.  Urinalysis: No results for input(s): COLORURINE, LABSPEC, PHURINE, GLUCOSEU,  HGBUR, BILIRUBINUR, KETONESUR, PROTEINUR, UROBILINOGEN, NITRITE, LEUKOCYTESUR in the last 72 hours.  Invalid input(s): APPERANCEUR    Imaging: Ct Chest Wo Contrast  Result Date: 06/18/2017 CLINICAL DATA:  Chest pain, hemoptysis. EXAM: CT CHEST WITHOUT CONTRAST TECHNIQUE: Multidetector CT imaging of the chest was performed following the standard protocol without IV contrast. COMPARISON:  CT scan of December 04, 2016. FINDINGS: Cardiovascular: Atherosclerosis of thoracic aorta is noted. 4.2 cm ascending thoracic aortic aneurysm is noted. Mild cardiomegaly is noted. Coronary artery calcifications are noted. No pericardial effusion is noted. Right internal jugular dialysis catheter is noted. Mediastinum/Nodes: No enlarged mediastinal or axillary lymph nodes. Trachea and esophagus demonstrate no significant findings. Stable calcified thyroid nodules are noted. Lungs/Pleura: No pneumothorax or pleural effusion is noted. There is interval development of reticular and airspace opacities involving the upper and lower lobes bilaterally which may represent acute inflammation or edema. Mild emphysematous disease is noted in the upper lobes. Upper Abdomen: Bilateral renal atrophy is noted consistent with history of end-stage renal disease. Stable hyperdense and hypodense cysts are noted. Musculoskeletal: Diffuse sclerosis of visualized skeleton is noted consistent with renal osteodystrophy. Stable old right rib fractures are noted. IMPRESSION: Coronary calcifications are noted suggesting coronary artery disease. 4.2 cm ascending thoracic aortic aneurysm. Recommend annual imaging followup by CTA or MRA. This recommendation follows 2010 ACCF/AHA/AATS/ACR/ASA/SCA/SCAI/SIR/STS/SVM Guidelines for the Diagnosis and Management of Patients with Thoracic Aortic Disease. Circulation. 2010; 121: S923-R007. Bilateral renal atrophy consistent with history of end-stage renal disease. Interval development of reticular and airspace  opacities involving the upper and lower lobes bilaterally which may represent acute pulmonary inflammation or edema. Aortic Atherosclerosis (ICD10-I70.0) and Emphysema (ICD10-J43.9). Electronically Signed   By: Marijo Conception, M.D.   On: 06/18/2017 10:41     Medications:   . sodium chloride Stopped (06/16/17 0258)  . cefTRIAXone (ROCEPHIN)  IV Stopped (06/18/17 0934)   . amLODipine  10 mg Oral Daily  . calcium acetate  2,001 mg Oral TID WC  . carvedilol  12.5 mg Oral BID WC  . epoetin (EPOGEN/PROCRIT) injection  10,000 Units Intravenous Q M,W,F-HD  . ezetimibe  10 mg Oral Daily  . feeding supplement (PRO-STAT SUGAR FREE 64)  30 mL Oral Daily  . furosemide  40 mg Oral Q T,Th,S,Su  . hydrALAZINE  25 mg Oral Q6H  . ipratropium-albuterol  3 mL Nebulization QID  . losartan  100 mg Oral Daily  . multivitamin  1 tablet Oral QHS  . pantoprazole  40 mg Oral BID  . sertraline  50 mg Oral Daily  . sodium chloride flush  3 mL Intravenous Q12H   acetaminophen **OR** acetaminophen, albuterol, bisacodyl,  calcium acetate, fluticasone, guaiFENesin-dextromethorphan, hydrALAZINE, HYDROcodone-acetaminophen, ondansetron **OR** ondansetron (ZOFRAN) IV, senna-docusate  Assessment/ Plan:  Ms. HENLEE DONOVAN is a 61 y.o. black female  with hypertension, hyperlipidemia, diastolic heart failure, hypertension, COPD, tobacco abuse, anemia, SHPTH, cocaine abuse  [place]. MWF   1. ESRD: seen and examined on hemodialysis. Tolerating treatment well. Not using AVF currently. Using RIJ permcath.  2. Hypertension: elevated - IV hydralazine as needed.  - amlodipine, furosemide, losartan, carvedilol  3. Anemia of chronic kidney disease. - EPO with HD treatment  4. Secondary hyperparathyroidism: with hypocalcemia and hyperphosphatemia - Calcium acetate with meals.  - Holding cinacalcet due to hypocalcemia.   5. Urinary Tract Infection: empiric ceftriaxone. Afebrile.    LOS: 2 Kartel Wolbert,  Shammond Arave 7/25/20189:41 AM

## 2017-06-19 NOTE — Progress Notes (Signed)
Patient ID: Michele Nash, female   DOB: [birth year], 61 y.o.   MRN: [id number]  [facility] PROGRESS NOTE  Michele Nash:[id number] DOB: 02-19-1956 DOA: 06/15/2017 PCP: [name], MD  HPI/Subjective: Patient's major complaint is headache today. She states she's had it for 3 days.some coughing up greenish phlegm.  Objective: Vitals:   06/19/17 1208 06/19/17 1230  BP: (!) 179/84 (!) 159/77  Pulse: 74 77  Resp: 17 18  Temp:      Filed Weights   06/19/17 0358 06/19/17 0854 06/19/17 1230  Weight: 57.1 kg (125 lb 12.8 oz) 59 kg (130 lb) 58.2 kg (128 lb 6.4 oz)    ROS: Review of Systems  Constitutional: Negative for chills and fever.  Eyes: Negative for blurred vision.  Respiratory: Negative for cough and shortness of breath.   Cardiovascular: Negative for chest pain.  Gastrointestinal: Negative for abdominal pain, constipation, diarrhea, nausea and vomiting.  Genitourinary: Negative for dysuria.  Musculoskeletal: Negative for joint pain.  Neurological: Positive for headaches. Negative for dizziness.   Exam: Physical Exam  Constitutional: She is oriented to person, place, and time.  HENT:  Nose: No mucosal edema.  Mouth/Throat: No oropharyngeal exudate or posterior oropharyngeal edema.  Eyes: Pupils are equal, round, and reactive to light. Conjunctivae, EOM and lids are normal.  Neck: No JVD present. Carotid bruit is not present. No edema present. No thyroid mass and no thyromegaly present.  Cardiovascular: S1 normal and S2 normal.  Exam reveals no gallop.   Murmur heard.  Systolic murmur is present with a grade of 3/6  Pulses:      Dorsalis pedis pulses are 2+ on the right side, and 2+ on the left side.  Respiratory: No respiratory distress. She has decreased breath sounds in the right lower field and the left lower field. She has no wheezes. She has rhonchi in the right lower field and the left lower field. She has no rales.  GI: Soft. Bowel sounds are normal.  There is no tenderness.  Musculoskeletal:       Right ankle: She exhibits no swelling.  Lymphadenopathy:    She has no cervical adenopathy.  Neurological: She is alert and oriented to person, place, and time. No cranial nerve deficit.  Skin: Skin is warm. No rash noted. Nails show no clubbing.  Psychiatric: She has a normal mood and affect.      Data Reviewed: Basic Metabolic Panel:  Recent Labs Lab 06/15/17 1127 06/16/17 1600 06/16/17 1604 06/17/17 1057 06/18/17 0635 06/19/17 0440  NA 139  --  136  --  137 137  K 5.4*  --  5.4*  --  4.5 4.6  CL 99*  --  99*  --  94* 95*  CO2 26  --  24  --  29 28  GLUCOSE 260*  --  114*  --  119* 142*  BUN 78*  --  88*  --  39* 59*  CREATININE 9.89*  --  10.61*  --  5.93* 7.70*  CALCIUM 7.3*  --  8.1*  --  8.2* 8.1*  PHOS  --  7.4*  --  6.3*  --   --    Liver Function Tests:  Recent Labs Lab 06/16/17 1604  AST 18  ALT 12*  ALKPHOS 153*  BILITOT 0.7  PROT 6.3*  ALBUMIN 3.2*   CBC:  Recent Labs Lab 06/15/17 1127 06/16/17 1604 06/18/17 0635 06/19/17 0440  WBC 8.1 3.1* 7.5 9.5  HGB 8.8* 8.2* 9.0*  8.9*  HCT 26.8* 25.0* 26.9* 27.0*  MCV 103.4* 103.5* 101.9* 104.2*  PLT 284 251 237 221   BNP (last 3 results)  Recent Labs  09/14/16 0707 09/14/16 0926 10/29/16 0940  BNP 457.0* 329.0* 216.0*      Recent Results (from the past 240 hour(s))  MRSA PCR Screening     Status: None   Collection Time: 06/16/17 12:32 AM  Result Value Ref Range Status   MRSA by PCR NEGATIVE NEGATIVE Final    Comment:        The GeneXpert MRSA Assay (FDA approved for NASAL specimens only), is one component of a comprehensive MRSA colonization surveillance program. It is not intended to diagnose MRSA infection nor to guide or monitor treatment for MRSA infections.   Urine Culture     Status: Abnormal   Collection Time: 06/16/17 12:32 AM  Result Value Ref Range Status   Specimen Description URINE, RANDOM  Final   Special Requests  NONE  Final   Culture (A)  Final    <10,000 COLONIES/mL INSIGNIFICANT GROWTH Performed at [facility], [place] [address], [place], [place] [address]    Report Status 06/18/2017 FINAL  Final     Studies: Ct Chest Wo Contrast  Result Date: 06/18/2017 CLINICAL DATA:  Chest pain, hemoptysis. EXAM: CT CHEST WITHOUT CONTRAST TECHNIQUE: Multidetector CT imaging of the chest was performed following the standard protocol without IV contrast. COMPARISON:  CT scan of December 04, 2016. FINDINGS: Cardiovascular: Atherosclerosis of thoracic aorta is noted. 4.2 cm ascending thoracic aortic aneurysm is noted. Mild cardiomegaly is noted. Coronary artery calcifications are noted. No pericardial effusion is noted. Right internal jugular dialysis catheter is noted. Mediastinum/Nodes: No enlarged mediastinal or axillary lymph nodes. Trachea and esophagus demonstrate no significant findings. Stable calcified thyroid nodules are noted. Lungs/Pleura: No pneumothorax or pleural effusion is noted. There is interval development of reticular and airspace opacities involving the upper and lower lobes bilaterally which may represent acute inflammation or edema. Mild emphysematous disease is noted in the upper lobes. Upper Abdomen: Bilateral renal atrophy is noted consistent with history of end-stage renal disease. Stable hyperdense and hypodense cysts are noted. Musculoskeletal: Diffuse sclerosis of visualized skeleton is noted consistent with renal osteodystrophy. Stable old right rib fractures are noted. IMPRESSION: Coronary calcifications are noted suggesting coronary artery disease. 4.2 cm ascending thoracic aortic aneurysm. Recommend annual imaging followup by CTA or MRA. This recommendation follows 2010 ACCF/AHA/AATS/ACR/ASA/SCA/SCAI/SIR/STS/SVM Guidelines for the Diagnosis and Management of Patients with Thoracic Aortic Disease. Circulation. 2010; 121: F751-W258. Bilateral renal atrophy consistent with history of end-stage  renal disease. Interval development of reticular and airspace opacities involving the upper and lower lobes bilaterally which may represent acute pulmonary inflammation or edema. Aortic Atherosclerosis (ICD10-I70.0) and Emphysema (ICD10-J43.9). Electronically Signed   By: Marijo Conception, M.D.   On: 06/18/2017 10:41    Scheduled Meds: . amLODipine  10 mg Oral Daily  . calcium acetate  2,001 mg Oral TID WC  . carvedilol  12.5 mg Oral BID WC  . epoetin (EPOGEN/PROCRIT) injection  10,000 Units Intravenous Q M,W,F-HD  . ezetimibe  10 mg Oral Daily  . feeding supplement (PRO-STAT SUGAR FREE 64)  30 mL Oral Daily  . furosemide  40 mg Oral Q T,Th,S,Su  . hydrALAZINE  25 mg Oral Q6H  . ipratropium-albuterol  3 mL Nebulization QID  . losartan  100 mg Oral Daily  . multivitamin  1 tablet Oral QHS  . pantoprazole  40 mg Oral  BID  . sertraline  50 mg Oral Daily  . sodium chloride flush  3 mL Intravenous Q12H    Assessment/Plan:  1. Uncontrolled hypertension on Coreg, Norvasc Cozaar and hydralazine. Lasix on nondialysis days 2. Headache. Give IV magnesium IV Decadron. This also could be secondary to uncontrolled hypertension 3. End-stage renal disease on dialysis 4. Fever. I don't think this is a urinary tract infection. Could be respiratory in nature. Continue Rocephin 5. Secondary hyperparathyroidism on PhosLo 6. anxiety on Xanax  Code Status:     Code Status Orders        Start     Ordered   06/15/17 1943  Full code  Continuous     06/15/17 1942    Code Status History    Date Active Date Inactive Code Status Order ID Comments User Context   05/21/2017  5:58 PM 05/22/2017  5:24 PM Full Code [id number]  Nicholes Mango, MD Inpatient   12/04/2016  9:55 AM 12/11/2016  5:41 PM Full Code [id number]  Fritzi Mandes, MD Inpatient   10/29/2016 12:14 PM 10/30/2016  7:43 PM Full Code [id number]  Laverle Hobby, MD ED   10/06/2016  8:33 PM 10/11/2016  3:54 PM Full Code [id number]  Mikael Spray,  NP ED   09/14/2016  9:19 AM 09/16/2016  4:07 PM Full Code [id number]  Laverle Hobby, MD ED   07/23/2016 11:05 AM 07/24/2016 10:49 PM Full Code [id number]  Wilhelmina Mcardle, MD ED   07/09/2016  6:43 PM 07/13/2016  5:23 PM Full Code [id number]  Hower, Aaron Mose, MD ED   05/28/2016  2:54 PM 06/02/2016  2:51 PM Full Code [id number]  Theodoro Grist, MD Inpatient   04/25/2016  6:04 PM 04/26/2016  9:17 PM Full Code [id number]  Nicholes Mango, MD Inpatient   10/28/2015  6:50 PM 10/30/2015  6:09 PM Full Code [id number]  Theodoro Grist, MD Inpatient   03/30/2015 12:51 AM 03/31/2015  4:44 PM Full Code [id number]  Juluis Mire, MD Inpatient    Advance Directive Documentation     Most Recent Value  Type of Advance Directive  Living will  Pre-existing out of facility DNR order (yellow form or pink MOST form)  -  "MOST" Form in Place?  -      Disposition Plan: potentially home tomorrow  Consultants:  nephrology  Antibiotics:  Rocephin  Time spent: 28 minutes  [place], [facility]

## 2017-06-19 NOTE — Progress Notes (Signed)
Pre dialysis  

## 2017-06-19 NOTE — Progress Notes (Signed)
Pre HD assessment  

## 2017-06-19 NOTE — Progress Notes (Signed)
  End of hd 

## 2017-06-19 NOTE — Progress Notes (Signed)
Post hd assessment 

## 2017-06-19 NOTE — Progress Notes (Signed)
* [facility]     Assessment and Plan:   Acute hemoptysis.  --Appears improved, with humidified oxygen, will continue.   Pulmonary edema/volume overload.  --I personally reviewed CT images, shows central pulm edema, dilated pulmonary vessels and biatrial dilation, overall c/w volume overload.  --Continue HD per nephrology.   Chronic respiratory failure.  --Appears controlled, continue baseline regimen for COPD.  --Advance activity as tolerated.   Pulmonary service will sign off for now, please call if there are any further questions or concerns.   Date: 06/19/2017  MRN# [id number] Michele Nash [birth year]   Michele Nash is a 61 y.o. old female seen in follow up for chief complaint of  Chief Complaint  Patient presents with  . Fall  . Weakness     Subjective.  Feels that cough and hemoptysis are better today.     Medication:    Current Facility-Administered Medications:  .  acetaminophen (TYLENOL) tablet 650 mg, 650 mg, Oral, Q6H PRN, 650 mg at 06/19/17 0837 **OR** acetaminophen (TYLENOL) suppository 650 mg, 650 mg, Rectal, Q6H PRN, Idelle Crouch, MD .  albuterol (PROVENTIL) (2.5 MG/3ML) 0.083% nebulizer solution 2.5 mg, 2.5 mg, Nebulization, Q6H PRN, Idelle Crouch, MD .  amLODipine (NORVASC) tablet 10 mg, 10 mg, Oral, Daily, Idelle Crouch, MD, 10 mg at 06/19/17 1232 .  bisacodyl (DULCOLAX) suppository 10 mg, 10 mg, Rectal, Daily PRN, Idelle Crouch, MD .  calcium acetate (PHOSLO) capsule 2,001 mg, 2,001 mg, Oral, TID WC, Kolluru, Sarath, MD, 2,001 mg at 06/19/17 1232 .  calcium acetate (PHOSLO) capsule 667 mg, 667 mg, Oral, QHS PRN, Idelle Crouch, MD .  carvedilol (COREG) tablet 12.5 mg, 12.5 mg, Oral, BID WC, Idelle Crouch, MD, 12.5 mg at 06/19/17 1238 .  epoetin alfa (EPOGEN,PROCRIT) injection 10,000 Units, 10,000 Units, Intravenous, Q M,W,F-HD, Kolluru, Sarath, MD, 10,000 Units at 06/19/17 1003 .  ezetimibe (ZETIA)  tablet 10 mg, 10 mg, Oral, Daily, Idelle Crouch, MD, 10 mg at 06/19/17 1232 .  feeding supplement (PRO-STAT SUGAR FREE 64) liquid 30 mL, 30 mL, Oral, Daily, Idelle Crouch, MD, 30 mL at 06/19/17 0840 .  fluticasone (FLONASE) 50 MCG/ACT nasal spray 2 spray, 2 spray, Each Nare, Daily PRN, Idelle Crouch, MD, 2 spray at 06/19/17 0301 .  furosemide (LASIX) tablet 40 mg, 40 mg, Oral, Q T,Th,S,Su, Idelle Crouch, MD, 40 mg at 06/18/17 0858 .  guaiFENesin-dextromethorphan (ROBITUSSIN DM) 100-10 MG/5ML syrup 5 mL, 5 mL, Oral, Q4H PRN, Max Sane, MD, 5 mL at 06/18/17 2123 .  hydrALAZINE (APRESOLINE) injection 10 mg, 10 mg, Intravenous, Q6H PRN, Idelle Crouch, MD, 10 mg at 06/17/17 0448 .  hydrALAZINE (APRESOLINE) tablet 25 mg, 25 mg, Oral, Q6H, Max Sane, MD, 25 mg at 06/19/17 1232 .  HYDROcodone-acetaminophen (NORCO/VICODIN) 5-325 MG per tablet 1 tablet, 1 tablet, Oral, Q6H PRN, Max Sane, MD, 1 tablet at 06/19/17 1238 .  ipratropium-albuterol (DUONEB) 0.5-2.5 (3) MG/3ML nebulizer solution 3 mL, 3 mL, Nebulization, QID, Sparks, Leonie Douglas, MD, 3 mL at 06/19/17 1554 .  losartan (COZAAR) tablet 100 mg, 100 mg, Oral, Daily, Idelle Crouch, MD, 100 mg at 06/19/17 1232 .  multivitamin (RENA-VIT) tablet 1 tablet, 1 tablet, Oral, QHS, Idelle Crouch, MD, 1 tablet at 06/18/17 2123 .  ondansetron (ZOFRAN) tablet 4 mg, 4 mg, Oral, Q6H PRN **OR** ondansetron (ZOFRAN) injection 4 mg, 4 mg, Intravenous, Q6H PRN, Idelle Crouch, MD, 4 mg at 06/16/17  0259 .  pantoprazole (PROTONIX) EC tablet 40 mg, 40 mg, Oral, BID, Idelle Crouch, MD, 40 mg at 06/19/17 0837 .  senna-docusate (Senokot-S) tablet 1 tablet, 1 tablet, Oral, QHS PRN, Idelle Crouch, MD .  sertraline (ZOLOFT) tablet 50 mg, 50 mg, Oral, Daily, Idelle Crouch, MD, 50 mg at 06/19/17 0837 .  sodium chloride flush (NS) 0.9 % injection 3 mL, 3 mL, Intravenous, Q12H, Max Sane, MD, 3 mL at 06/19/17 5329   Allergies:  Chantix  [varenicline] and Sulfa antibiotics  Review of Systems: Gen:  Denies  fever, sweats. HEENT: Denies blurred vision. Cvc:  No dizziness, chest pain or heaviness Resp:   Denies cough or sputum porduction. Gi: Denies swallowing difficulty, stomach pain. constipation, bowel incontinence Gu:  Denies bladder incontinence, burning urine Ext:   No Joint pain, stiffness. Skin: No skin rash, easy bruising. Endoc:  No polyuria, polydipsia. Psych: No depression, insomnia. Other:  All other systems were reviewed and found to be negative other than what is mentioned in the HPI.   Physical Examination:   VS: BP (!) 159/77   Pulse 77   Temp 98.6 F (37 C) (Oral)   Resp 18   Ht 5\' 5"  (1.651 m)   Wt 128 lb 6.4 oz (58.2 kg)   SpO2 96%   BMI 21.37 kg/m    General Appearance: No distress  Neuro:without focal findings,  speech normal,  HEENT: PERRLA, EOM intact. Pulmonary: normal breath sounds, No wheezing.   CardiovascularNormal S1,S2.  No m/r/g.   Abdomen: Benign, Soft, non-tender. Renal:  No costovertebral tenderness  GU:  Not performed at this time. Endoc: No evident thyromegaly, no signs of acromegaly. Skin:   warm, no rash. Extremities: normal, no cyanosis, clubbing.   LABORATORY PANEL:   CBC  Recent Labs Lab 06/19/17 0440  WBC 9.5  HGB 8.9*  HCT 27.0*  PLT 221   ------------------------------------------------------------------------------------------------------------------  Chemistries   Recent Labs Lab 06/16/17 1604  06/19/17 0440  NA 136  < > 137  K 5.4*  < > 4.6  CL 99*  < > 95*  CO2 24  < > 28  GLUCOSE 114*  < > 142*  BUN 88*  < > 59*  CREATININE 10.61*  < > 7.70*  CALCIUM 8.1*  < > 8.1*  AST 18  --   --   ALT 12*  --   --   ALKPHOS 153*  --   --   BILITOT 0.7  --   --   < > = values in this interval not displayed. ------------------------------------------------------------------------------------------------------------------  Cardiac Enzymes No  results for input(s): TROPONINI in the last 168 hours. ------------------------------------------------------------  RADIOLOGY:   No results found for this or any previous visit. Results for orders placed during the hospital encounter of 06/15/17  DG Chest 2 View   Narrative CLINICAL DATA:  COPD, end-stage renal disease, fell this morning, lightheaded smoker, hypertension  EXAM: CHEST  2 VIEW  COMPARISON:  06/05/2017  FINDINGS: RIGHT jugular central venous catheter with tip projecting over SVC.  Enlargement of cardiac silhouette with pulmonary vascular congestion.  Tortuous aorta with atherosclerotic calcification.  Diffuse interstitial infiltrates likely pulmonary edema, question CHF versus fluid overload.  Subsegmental atelectasis RIGHT base.  No pleural effusion or pneumothorax.  Bones demineralized.  IMPRESSION: Enlargement of cardiac silhouette with pulmonary vascular congestion and diffuse interstitial edema question fluid overload versus CHF.  Aortic Atherosclerosis (ICD10-I70.0).   Electronically Signed   By: Crist Infante.D.  On: 06/15/2017 16:39    ------------------------------------------------------------------------------------------------------------------  Thank  you for allowing [facility] to assist in the care of your patient. Our recommendations are noted above.  Please contact us if we can be of further service.   Marda Stalker, MD.  [facility] Office Number: [phone]  Patricia Pesa, M.D.  Merton Border, M.D  06/19/2017

## 2017-06-19 NOTE — Progress Notes (Signed)
HD initiated via R Chest HD cath without issue. Patient will not allow cannulation of avf. No heparin treatment. Patient currently in no distress. Wheezing noted. No edema noted. Continue to  monitor

## 2017-06-20 LAB — HEPATITIS B SURFACE ANTIGEN: Hepatitis B Surface Ag: NEGATIVE

## 2017-06-20 MED ORDER — AMOXICILLIN-POT CLAVULANATE 500-125 MG PO TABS: 1.0000 | ORAL_TABLET | Freq: Every day | ORAL | 0 refills | Status: DC

## 2017-06-20 MED ORDER — FLUCONAZOLE 150 MG PO TABS: 150.0000 mg | ORAL_TABLET | Freq: Once | ORAL | 0 refills | Status: AC

## 2017-06-20 MED ORDER — HYDRALAZINE HCL 50 MG PO TABS: 50.0000 mg | ORAL_TABLET | Freq: Three times a day (TID) | ORAL | 0 refills | Status: DC

## 2017-06-20 MED ORDER — CALCIUM ACETATE (PHOS BINDER) 667 MG PO CAPS: 2001.0000 mg | ORAL_CAPSULE | Freq: Three times a day (TID) | ORAL | 0 refills | Status: AC

## 2017-06-20 MED ORDER — AMOXICILLIN-POT CLAVULANATE 500-125 MG PO TABS
1.0000 | ORAL_TABLET | Freq: Every day | ORAL | Status: DC
  Filled 2017-06-20: qty 1

## 2017-06-20 MED ORDER — FLUCONAZOLE 100 MG PO TABS
150.0000 mg | ORAL_TABLET | Freq: Once | ORAL | Status: AC
  Administered 2017-06-20: 150 mg via ORAL
  Filled 2017-06-20: qty 1.5

## 2017-06-20 MED ORDER — HYDRALAZINE HCL 50 MG PO TABS: 50.0000 mg | ORAL_TABLET | Freq: Three times a day (TID) | ORAL | Status: DC

## 2017-06-20 MED ORDER — HYDRALAZINE HCL 25 MG PO TABS
25.0000 mg | ORAL_TABLET | Freq: Once | ORAL | Status: AC
  Administered 2017-06-20: 25 mg via ORAL
  Filled 2017-06-20: qty 1

## 2017-06-20 NOTE — Care Management Important Message (Signed)
Important Message  Patient Details  Name: Michele Nash MRN: [id number] Date of Birth: 1956/03/18   Medicare Important Message Given:  Yes  Signed IM notice given     Katrina Stack, RN 06/20/2017, 10:10 AM

## 2017-06-20 NOTE — Discharge Instructions (Signed)

## 2017-06-20 NOTE — Discharge Summary (Signed)
[facility] [place] NAME: Michele Nash    MR#:  [id number]  DATE OF BIRTH:  20-Mar-1956  DATE OF ADMISSION:  06/15/2017 ADMITTING PHYSICIAN: Idelle Crouch, MD  DATE OF DISCHARGE: 06/20/2017  1:46 PM  PRIMARY CARE PHYSICIAN: [name], MD    ADMISSION DIAGNOSIS:  Generalized weakness [R53.1] Fall, initial encounter [W19.XXXA] Acute on chronic respiratory failure with hypoxia (HCC) [J96.21] Acute on chronic congestive heart failure, unspecified heart failure type ([place]) [I50.9]  DISCHARGE DIAGNOSIS:  Principal Problem:   Dizziness Active Problems:   ESRD on hemodialysis (HCC)   HTN (hypertension)   Chronic diastolic CHF (congestive heart failure) (HCC)   UTI (urinary tract infection)   SECONDARY DIAGNOSIS:   Past Medical History:  Diagnosis Date  . Altered mental status   . Anemia   . Apnea, sleep    for sleep study 10/17/15-no cpap yet  . Arthritis   . Bronchitis   . Chronic kidney disease   . COPD (chronic obstructive pulmonary disease) ([place])   . Depression   . Dialysis patient ([place]) 2014  . GERD (gastroesophageal reflux disease)   . GIB (gastrointestinal bleeding) 07/09/2016  . Headache   . Hyperlipidemia   . Hypertension   . Obesity   . Pulmonary edema 09/14/2016  . Renal dialysis device, implant, or graft complication    RIGHT CHEST CATH  . Respiratory failure ([place]) 07/23/2016  . Traumatic hematoma of forehead   . Type II diabetes mellitus ([place]) 09/21/2009   diet controlled    HOSPITAL COURSE:   1.  Uncontrolled hypertension. Patient's blood pressures been high during the entire hospitalization. Patient is on Coreg, Norvasc, Cozaar, Lasix on nondialysis days and started on hydralazine. Continue monitor blood pressure closely as outpatient. 2. Headache. This has resolved after giving IV magnesium and Decadron yesterday. 3. End-stage renal disease on dialysis. Continue dialysis unusual schedule Monday  Wednesday Friday 4. Upper respiratory tract infection and fever. I do not believe this is a urinary tract infection. Patient given Rocephin while here. A few more days of Augmentin upon going home 5. 5. Secondary hyperparathyroidism on PhosLo. 5. Anxiety on Xanax 6. Chronic respiratory failure on 3 L of oxygen at home with history of COPD  7. Anemia of chronic renal disease   DISCHARGE CONDITIONS:  Satisfactory  CONSULTS OBTAINED:  Treatment Team:  Anthonette Legato, MD  DRUG ALLERGIES:   Allergies  Allergen Reactions  . Chantix [Varenicline]     hallucinations  . Sulfa Antibiotics Itching, Swelling, Rash and Other (See Comments)    Reaction:  Facial/body swelling     DISCHARGE MEDICATIONS:   Discharge Medication List as of 06/20/2017 10:23 AM    START taking these medications   Details  amoxicillin-clavulanate (AUGMENTIN) 500-125 MG tablet Take 1 tablet (500 mg total) by mouth at bedtime., Starting Thu 06/20/2017, Print    fluconazole (DIFLUCAN) 150 MG tablet Take 1 tablet (150 mg total) by mouth once., Starting Thu 06/20/2017, Print    hydrALAZINE (APRESOLINE) 50 MG tablet Take 1 tablet (50 mg total) by mouth 3 (three) times daily., Starting Thu 06/20/2017, Print      CONTINUE these medications which have CHANGED   Details  calcium acetate (PHOSLO) 667 MG capsule Take 3 capsules (2,001 mg total) by mouth 3 (three) times daily with meals., Starting Thu 06/20/2017, Print      CONTINUE these medications which have NOT CHANGED   Details  acetaminophen (TYLENOL) 325 MG tablet Take 2  tablets (650 mg total) by mouth every 6 (six) hours as needed for mild pain (or Fever >/= 101)., Starting Tue 12/11/2016, OTC    albuterol (PROVENTIL HFA;VENTOLIN HFA) 108 (90 Base) MCG/ACT inhaler Inhale 2 puffs into the lungs every 6 (six) hours as needed for wheezing or shortness of breath., Starting Tue 12/11/2016, Print    ALPRAZolam (XANAX) 0.5 MG tablet Take 0.5 mg by mouth 2 (two) times daily.,  Historical Med    amLODipine (NORVASC) 10 MG tablet Take 10 mg by mouth daily. , Historical Med    carvedilol (COREG) 12.5 MG tablet Take 12.5 mg by mouth 2 (two) times daily with a meal., Historical Med    ezetimibe (ZETIA) 10 MG tablet Take 10 mg by mouth daily. , Historical Med    fluticasone (FLONASE) 50 MCG/ACT nasal spray Place 2 sprays into both nostrils daily as needed for rhinitis., Historical Med    furosemide (LASIX) 40 MG tablet Take 40 mg by mouth every Tuesday, Thursday, Saturday, and Sunday. , Historical Med    HYDROcodone-acetaminophen (NORCO) 5-325 MG tablet Take 1-2 tablets by mouth every 6 (six) hours as needed for moderate pain or severe pain., Starting Fri 05/24/2017, Print    ipratropium-albuterol (DUONEB) 0.5-2.5 (3) MG/3ML SOLN Take 3 mLs by nebulization every 6 (six) hours as needed., Starting Tue 12/11/2016, Print    lidocaine-prilocaine (EMLA) cream Apply 1 application topically as needed (prior to accessing port)., Historical Med    losartan (COZAAR) 100 MG tablet Take 100 mg by mouth daily. , Historical Med    multivitamin (RENA-VIT) TABS tablet Take 1 tablet by mouth at bedtime. , Historical Med    ondansetron (ZOFRAN) 4 MG tablet Take 1 tablet (4 mg total) by mouth every 8 (eight) hours as needed for nausea or vomiting., Starting Sun 03/24/2017, Print    pantoprazole (PROTONIX) 40 MG tablet Take 40 mg by mouth 2 (two) times daily., Historical Med    senna-docusate (SENOKOT-S) 8.6-50 MG tablet Take 1 tablet by mouth at bedtime as needed for mild constipation., Starting Tue 12/11/2016, OTC    sertraline (ZOLOFT) 50 MG tablet Take 50 mg by mouth daily. , Historical Med    traMADol (ULTRAM) 50 MG tablet Take 1 tablet (50 mg total) by mouth every 12 (twelve) hours as needed for moderate pain., Starting Tue 12/11/2016, Print    Amino Acids-Protein Hydrolys (FEEDING SUPPLEMENT, PRO-STAT SUGAR FREE 64,) LIQD Take 30 mLs by mouth daily., Starting Wed 12/12/2016, Normal     feeding supplement, ENSURE ENLIVE, (ENSURE ENLIVE) LIQD Take 237 mLs by mouth 3 (three) times daily between meals., Starting Thu 10/11/2016, Normal         DISCHARGE INSTRUCTIONS:   Follow-up dialysis as scheduled.   follow-up PMD one week  If you experience worsening of your admission symptoms, develop shortness of breath, life threatening emergency, suicidal or homicidal thoughts you must seek medical attention immediately by calling 911 or calling your MD immediately  if symptoms less severe.  You Must read complete instructions/literature along with all the possible adverse reactions/side effects for all the Medicines you take and that have been prescribed to you. Take any new Medicines after you have completely understood and accept all the possible adverse reactions/side effects.   Please note  You were cared for by a hospitalist during your hospital stay. If you have any questions about your discharge medications or the care you received while you were in the hospital after you are discharged, you can call the unit  and asked to speak with the hospitalist on call if the hospitalist that took care of you is not available. Once you are discharged, your primary care physician will handle any further medical issues. Please note that NO REFILLS for any discharge medications will be authorized once you are discharged, as it is imperative that you return to your primary care physician (or establish a relationship with a primary care physician if you do not have one) for your aftercare needs so that they can reassess your need for medications and monitor your lab values.    Today   CHIEF COMPLAINT:   Chief Complaint  Patient presents with  . Fall  . Weakness    HISTORY OF PRESENT ILLNESS:  Michele Nash  is a 61 y.o. female Presented with fall and weakness   VITAL SIGNS:  Blood pressure (!) 177/83, pulse 64, temperature 98 F (36.7 C), temperature source Oral, resp. rate 16,  height 5\' 5"  (1.651 m), weight 57.3 kg (126 lb 6.4 oz), SpO2 98 %.   PHYSICAL EXAMINATION:  GENERAL:  61 y.o.-year-old patient lying in the bed with no acute distress.  EYES: Pupils equal, round, reactive to light and accommodation. No scleral icterus. Extraocular muscles intact.  HEENT: Head atraumatic, normocephalic. Oropharynx and nasopharynx clear.  NECK:  Supple, no jugular venous distention. No thyroid enlargement, no tenderness.  LUNGS: Normal breath sounds bilaterally, no wheezing, rales,rhonchi or crepitation. No use of accessory muscles of respiration.  CARDIOVASCULAR: S1, S2 normal. No murmurs, rubs, or gallops.  ABDOMEN: Soft, non-tender, non-distended. Bowel sounds present. No organomegaly or mass.  EXTREMITIES: No pedal edema, cyanosis, or clubbing.  NEUROLOGIC: Cranial nerves II through XII are intact. Muscle strength 5/5 in all extremities. Sensation intact. Gait not checked.  PSYCHIATRIC: The patient is alert and oriented x 3.  SKIN: No obvious rash, lesion, or ulcer.   DATA REVIEW:   CBC  Recent Labs Lab 06/19/17 0440  WBC 9.5  HGB 8.9*  HCT 27.0*  PLT 221    Chemistries   Recent Labs Lab 06/16/17 1604  06/19/17 0440  NA 136  < > 137  K 5.4*  < > 4.6  CL 99*  < > 95*  CO2 24  < > 28  GLUCOSE 114*  < > 142*  BUN 88*  < > 59*  CREATININE 10.61*  < > 7.70*  CALCIUM 8.1*  < > 8.1*  AST 18  --   --   ALT 12*  --   --   ALKPHOS 153*  --   --   BILITOT 0.7  --   --   < > = values in this interval not displayed.   Microbiology Results  Results for orders placed or performed during the hospital encounter of 06/15/17  MRSA PCR Screening     Status: None   Collection Time: 06/16/17 12:32 AM  Result Value Ref Range Status   MRSA by PCR NEGATIVE NEGATIVE Final    Comment:        The GeneXpert MRSA Assay (FDA approved for NASAL specimens only), is one component of a comprehensive MRSA colonization surveillance program. It is not intended to diagnose  MRSA infection nor to guide or monitor treatment for MRSA infections.   Urine Culture     Status: Abnormal   Collection Time: 06/16/17 12:32 AM  Result Value Ref Range Status   Specimen Description URINE, RANDOM  Final   Special Requests NONE  Final   Culture (A)  Final    <  10,000 COLONIES/mL INSIGNIFICANT GROWTH Performed at [facility], [place] [address] [place], [place] [address]    Report Status 06/18/2017 FINAL  Final     Management plans discussed with the patient, And she is  in agreement.  CODE STATUS:     Code Status Orders        Start     Ordered   06/15/17 1943  Full code  Continuous     06/15/17 1942    Code Status History    Date Active Date Inactive Code Status Order ID Comments User Context   05/21/2017  5:58 PM 05/22/2017  5:24 PM Full Code [id number]  Nicholes Mango, MD Inpatient   12/04/2016  9:55 AM 12/11/2016  5:41 PM Full Code [id number]  Fritzi Mandes, MD Inpatient   10/29/2016 12:14 PM 10/30/2016  7:43 PM Full Code [id number]  Laverle Hobby, MD ED   10/06/2016  8:33 PM 10/11/2016  3:54 PM Full Code [id number]  Mikael Spray, NP ED   09/14/2016  9:19 AM 09/16/2016  4:07 PM Full Code [id number]  Laverle Hobby, MD ED   07/23/2016 11:05 AM 07/24/2016 10:49 PM Full Code [id number]  Wilhelmina Mcardle, MD ED   07/09/2016  6:43 PM 07/13/2016  5:23 PM Full Code [id number]  Hower, Aaron Mose, MD ED   05/28/2016  2:54 PM 06/02/2016  2:51 PM Full Code [id number]  Theodoro Grist, MD Inpatient   04/25/2016  6:04 PM 04/26/2016  9:17 PM Full Code [id number]  Nicholes Mango, MD Inpatient   10/28/2015  6:50 PM 10/30/2015  6:09 PM Full Code [id number]  Theodoro Grist, MD Inpatient   03/30/2015 12:51 AM 03/31/2015  4:44 PM Full Code [id number]  Juluis Mire, MD Inpatient    Advance Directive Documentation     Most Recent Value  Type of Advance Directive  Living will  Pre-existing out of facility DNR order (yellow form or pink MOST form)  -  "MOST" Form in Place?  -       TOTAL TIME TAKING CARE OF THIS PATIENT: 35 minutes.    Loletha Grayer M.D on 06/20/2017 at 3:45 PM  Between 7am to 6pm - Pager - [phone]  After 6pm go to [email or web address] - password [email or web address] [facility]  [phone]  CC: Primary care physician; [name], MD

## 2017-06-20 NOTE — Progress Notes (Signed)
[facility]  ROUNDING NOTE   Subjective:   Hemodialysis treatment yesterday. Tolerated treatment well. UF 1.7 litres.   Husband at bedside.   Objective:  Vital signs in last 24 hours:  Temp:  [98 F (36.7 C)-98.6 F (37 C)] 98 F (36.7 C) (07/26 0440) Pulse Rate:  [64-77] 64 (07/26 0440) Resp:  [16-18] 16 (07/26 0440) BP: (154-179)/(67-86) 177/83 (07/26 0440) SpO2:  [95 %-100 %] 98 % (07/26 0440) Weight:  [57.3 kg (126 lb 6.4 oz)-58.2 kg (128 lb 6.4 oz)] 57.3 kg (126 lb 6.4 oz) (07/26 0440)  Weight change: 0.771 kg (1 lb 11.2 oz) Filed Weights   06/19/17 0854 06/19/17 1230 06/20/17 0440  Weight: 59 kg (130 lb) 58.2 kg (128 lb 6.4 oz) 57.3 kg (126 lb 6.4 oz)    Intake/Output: I/O last 3 completed shifts: In: 1080 [P.O.:1077; I.V.:3] Out: 2350 [Urine:650; Other:1700]   Intake/Output this shift:  Total I/O In: 240 [P.O.:240] Out: 100 [Urine:100]  Physical Exam: General: NAD, sitting in bed  Head: Normocephalic, atraumatic. Moist oral mucosal membranes  Eyes: Anicteric, PERRL  Neck: Supple, trachea midline  Lungs:  Clear to auscultation  Heart: Regular rate and rhythm  Abdomen:  Soft, nontender,   Extremities: no peripheral edema.  Neurologic: Nonfocal, moving all four extremities  Skin: No lesions  Access: Left AVF, RIJ permcath    Basic Metabolic Panel:  Recent Labs Lab 06/15/17 1127 06/16/17 1600 06/16/17 1604 06/17/17 1057 06/18/17 0635 06/19/17 0440  NA 139  --  136  --  137 137  K 5.4*  --  5.4*  --  4.5 4.6  CL 99*  --  99*  --  94* 95*  CO2 26  --  24  --  29 28  GLUCOSE 260*  --  114*  --  119* 142*  BUN 78*  --  88*  --  39* 59*  CREATININE 9.89*  --  10.61*  --  5.93* 7.70*  CALCIUM 7.3*  --  8.1*  --  8.2* 8.1*  PHOS  --  7.4*  --  6.3*  --   --     Liver Function Tests:  Recent Labs Lab 06/16/17 1604  AST 18  ALT 12*  ALKPHOS 153*  BILITOT 0.7  PROT 6.3*  ALBUMIN 3.2*   No results for input(s): LIPASE, AMYLASE  in the last 168 hours. No results for input(s): AMMONIA in the last 168 hours.  CBC:  Recent Labs Lab 06/15/17 1127 06/16/17 1604 06/18/17 0635 06/19/17 0440  WBC 8.1 3.1* 7.5 9.5  HGB 8.8* 8.2* 9.0* 8.9*  HCT 26.8* 25.0* 26.9* 27.0*  MCV 103.4* 103.5* 101.9* 104.2*  PLT 284 251 237 221    Cardiac Enzymes: No results for input(s): CKTOTAL, CKMB, CKMBINDEX, TROPONINI in the last 168 hours.  BNP: Invalid input(s): POCBNP  CBG: No results for input(s): GLUCAP in the last 168 hours.  Microbiology: Results for orders placed or performed during the hospital encounter of 06/15/17  MRSA PCR Screening     Status: None   Collection Time: 06/16/17 12:32 AM  Result Value Ref Range Status   MRSA by PCR NEGATIVE NEGATIVE Final    Comment:        The GeneXpert MRSA Assay (FDA approved for NASAL specimens only), is one component of a comprehensive MRSA colonization surveillance program. It is not intended to diagnose MRSA infection nor to guide or monitor treatment for MRSA infections.   Urine Culture  Status: Abnormal   Collection Time: 06/16/17 12:32 AM  Result Value Ref Range Status   Specimen Description URINE, RANDOM  Final   Special Requests NONE  Final   Culture (A)  Final    <10,000 COLONIES/mL INSIGNIFICANT GROWTH Performed at [facility], [address], [place], [place] [address]    Report Status 06/18/2017 FINAL  Final    Coagulation Studies: No results for input(s): LABPROT, INR in the last 72 hours.  Urinalysis: No results for input(s): COLORURINE, LABSPEC, PHURINE, GLUCOSEU, HGBUR, BILIRUBINUR, KETONESUR, PROTEINUR, UROBILINOGEN, NITRITE, LEUKOCYTESUR in the last 72 hours.  Invalid input(s): APPERANCEUR    Imaging: No results found.   Medications:    . amLODipine  10 mg Oral Daily  . amoxicillin-clavulanate  1 tablet Oral QHS  . calcium acetate  2,001 mg Oral TID WC  . carvedilol  12.5 mg Oral BID WC  . epoetin (EPOGEN/PROCRIT)  injection  10,000 Units Intravenous Q M,W,F-HD  . ezetimibe  10 mg Oral Daily  . feeding supplement (PRO-STAT SUGAR FREE 64)  30 mL Oral Daily  . furosemide  40 mg Oral Q T,Th,S,Su  . hydrALAZINE  50 mg Oral TID WC & HS  . ipratropium-albuterol  3 mL Nebulization QID  . losartan  100 mg Oral Daily  . multivitamin  1 tablet Oral QHS  . pantoprazole  40 mg Oral BID  . sertraline  50 mg Oral Daily  . sodium chloride flush  3 mL Intravenous Q12H   acetaminophen **OR** acetaminophen, albuterol, bisacodyl, calcium acetate, fluticasone, guaiFENesin-dextromethorphan, hydrALAZINE, HYDROcodone-acetaminophen, ondansetron **OR** ondansetron (ZOFRAN) IV, senna-docusate  Assessment/ Plan:  [name] is a 61 y.o. black female  with hypertension, hyperlipidemia, diastolic heart failure, hypertension, COPD, tobacco abuse, anemia, SHPTH, cocaine abuse  [place]. MWF   1. ESRD: tolerated hemodialysis well yesterday.  Not using AVF currently. Using RIJ permcath.  2. Hypertension:elevated - IV hydralazine as needed.  - amlodipine, furosemide, losartan, carvedilol  3. Anemia of chronic kidney disease: hemoglobin 8.9 - EPO with HD treatment  4. Secondary hyperparathyroidism: with hypocalcemia and hyperphosphatemia - Calcium acetate with meals.  - Holding cinacalcet due to hypocalcemia.   5. Urinary Tract Infection: completed course of ceftriaxone.     LOS: 3 Ty [place] 7/26/201811:54 AM

## 2017-06-20 NOTE — Care Management (Signed)
Patient is for discharge home today and declines any home health follow up.  Has portable oxygen for transport home.  Notified [name] with [facility] of discharge.  [facility] .

## 2017-06-21 LAB — HEPATITIS B SURFACE ANTIGEN: Hepatitis B Surface Ag: NEGATIVE

## 2017-06-27 ENCOUNTER — Encounter: Payer: Self-pay | Admitting: Surgery

## 2017-06-27 ENCOUNTER — Ambulatory Visit (INDEPENDENT_AMBULATORY_CARE_PROVIDER_SITE_OTHER): Payer: Medicare Other | Admitting: Surgery

## 2017-06-27 ENCOUNTER — Telehealth: Payer: Self-pay

## 2017-06-27 VITALS — BP 167/78 | HR 89 | Temp 98.1°F | Ht 65.0 in | Wt 127.2 lb

## 2017-06-27 DIAGNOSIS — N644 Mastodynia: Secondary | ICD-10-CM

## 2017-06-27 NOTE — Progress Notes (Signed)
Michele Nash is an 61 y.o. female.   Chief Complaint: swelling in breast, rt HPI: Patient has been experiencing right breast swelling and pain since yesterday she states the pain is throbbing in nature and comes and goes. She's never had an episode like this before. She has noticed yellow to clear discharge from both breasts over several years. Not had a mammogram in several years. She does have a strong family history of breast cancer in her sister.  See below she has multiple medical problems in fact she was recently hospitalized.  Past Medical History:  Diagnosis Date  . Altered mental status   . Anemia   . Apnea, sleep    for sleep study 10/17/15-no cpap yet  . Arthritis   . Bronchitis   . Chronic kidney disease   . COPD (chronic obstructive pulmonary disease) ([place])   . Depression   . Dialysis patient ([place]) 2014  . GERD (gastroesophageal reflux disease)   . GIB (gastrointestinal bleeding) 07/09/2016  . Headache   . Hyperlipidemia   . Hypertension   . Obesity   . Pulmonary edema 09/14/2016  . Renal dialysis device, implant, or graft complication    RIGHT CHEST CATH  . Respiratory failure ([place]) 07/23/2016  . Traumatic hematoma of forehead   . Type II diabetes mellitus ([place]) 09/21/2009   diet controlled    Past Surgical History:  Procedure Laterality Date  . ABDOMINAL HYSTERECTOMY    . COLONOSCOPY  2011  . COLONOSCOPY WITH PROPOFOL N/A 09/20/2015   Procedure: COLONOSCOPY WITH PROPOFOL;  Surgeon: Lucilla Lame, MD;  Location: [facility] ENDOSCOPY;  Service: Endoscopy;  Laterality: N/A;  . DIALYSIS FISTULA CREATION    . ESOPHAGOGASTRODUODENOSCOPY N/A 07/13/2016   Procedure: ESOPHAGOGASTRODUODENOSCOPY (EGD);  Surgeon: Lollie Sails, MD;  Location: [facility] ENDOSCOPY;  Service: Endoscopy;  Laterality: N/A;  . ESOPHAGOGASTRODUODENOSCOPY (EGD) WITH PROPOFOL N/A 06/02/2016   Procedure: ESOPHAGOGASTRODUODENOSCOPY (EGD) WITH PROPOFOL;  Surgeon: Manya Silvas, MD;  Location[facility] ENDOSCOPY;   Service: Endoscopy;  Laterality: N/A;  . LIGATION OF ARTERIOVENOUS  FISTULA Left 05/24/2017   Procedure: LIGATION OF ARTERIOVENOUS  FISTULA ( PERMCATH INSERTION );  Surgeon: Katha Cabal, MD;  Location: [facility] ORS;  Service: Vascular;  Laterality: Left;  . PERIPHERAL VASCULAR CATHETERIZATION N/A 05/10/2015   Procedure: A/V Shuntogram/Fistulagram;  Surgeon: Katha Cabal, MD;  Location: [facility] CV LAB;  Service: Cardiovascular;  Laterality: N/A;  . PERIPHERAL VASCULAR CATHETERIZATION Left 05/10/2015   Procedure: A/V Shunt Intervention;  Surgeon: Katha Cabal, MD;  Location: [place] CV LAB;  Service: Cardiovascular;  Laterality: Left;  . PERIPHERAL VASCULAR CATHETERIZATION Left 08/23/2015   Procedure: A/V Shuntogram/Fistulagram;  Surgeon: Katha Cabal, MD;  Location: [place] CV LAB;  Service: Cardiovascular;  Laterality: Left;  . PERIPHERAL VASCULAR CATHETERIZATION N/A 08/23/2015   Procedure: A/V Shunt Intervention;  Surgeon: Katha Cabal, MD;  Location: [place] CV LAB;  Service: Cardiovascular;  Laterality: N/A;  . PERIPHERAL VASCULAR CATHETERIZATION  08/23/2015   Procedure: Dialysis/Perma Catheter Insertion;  Surgeon: Katha Cabal, MD;  Location: [place] CV LAB;  Service: Cardiovascular;;  . PERIPHERAL VASCULAR CATHETERIZATION N/A 01/03/2016   Procedure: Dialysis/Perma Catheter Removal;  Surgeon: Katha Cabal, MD;  Location: [place] [facility] LAB;  Service: Cardiovascular;  Laterality: N/A;  . REVISON OF ARTERIOVENOUS FISTULA Left 10/28/2015   Procedure: REVISON OF ARTERIOVENOUS FISTULA WITH ARTEGRAFT;  Surgeon: Katha Cabal, MD;  Location: [facility] ORS;  Service: Vascular;  Laterality: Left;  . WOUND  DEBRIDEMENT Left 10/28/2015   Procedure: Resection of shoulder cyst ( left );  Surgeon: Katha Cabal, MD;  Location: [facility] ORS;  Service: Vascular;  Laterality: Left;    Family History  Problem Relation Age of Onset  . Heart disease Mother    . Cancer Father   . Cancer Sister    Social History:  reports that she has been smoking Cigarettes.  She has smoked for the past 40.00 years. She has never used smokeless tobacco. She reports that she does not drink alcohol or use drugs.  Allergies:  Allergies  Allergen Reactions  . Chantix [Varenicline]     hallucinations  . Sulfa Antibiotics Itching, Swelling, Rash and Other (See Comments)    Reaction:  Facial/body swelling      (Not in a hospital admission)   Review of Systems:   Review of Systems  Unable to perform ROS: Mental acuity    Physical Exam:  Physical Exam  Constitutional: She is oriented to person, place, and time. No distress.  Chronically ill-appearing female patient in home oxygen by nasal cannula she also has a dialysis catheter in the right subclavian position.  HENT:  Head: Normocephalic and atraumatic.  Eyes: Pupils are equal, round, and reactive to light. Right eye exhibits no discharge. Left eye exhibits no discharge. No scleral icterus.  Neck: Normal range of motion.  Cardiovascular: Normal rate and regular rhythm.   Pulmonary/Chest: Effort normal. No respiratory distress.  Oxygen via nasal cannula  Abdominal: Soft. She exhibits no distension. There is no tenderness.  Musculoskeletal: Normal range of motion. She exhibits no edema or tenderness.  Lymphadenopathy:    She has no cervical adenopathy.  Neurological: She is alert and oriented to person, place, and time.  Skin: Skin is warm and dry. No rash noted. No erythema.  Psychiatric: Mood and affect normal.  Vitals reviewed.  Bilateral lateral breast exam is performed. There are multiple periareolar scars no mass in either breast no axillary adenopathy no erythema no tenderness. In the right breast more so than the left is expressible clear fluid from a single duct with no tenderness or purulence.  Blood pressure (!) 167/78, pulse 89, temperature 98.1 F (36.7 C), temperature source Oral,  height 5\' 5"  (1.651 m), weight 127 lb 3.2 oz (57.7 kg).    No results found for this or any previous visit (from the past 48 hour(s)). No results found.   Assessment/Plan A search of the chart fails identify a recent mammography available. We will evaluate with a new mammogram and follow-up next week.  Patient describes fairly acute onset of right breast pain. I see no sign of infection and no sign of abnormality of the right breast to identify or suggest a cause for this pain. We will obtain a mammogram and reevaluate next week.  Florene Glen, MD, FACS

## 2017-06-27 NOTE — Patient Instructions (Signed)
We have scheduled you for a Mammogram and Breast ultrasound today. This will be done at [facility] at the front of [place] office will call you with the details of this appointment as soon as it has been received. If you wish to reschedule your appointment, you may do so by calling [phone].  We will see you back after your testing has been completed. Please see appointment below.   If you have any questions or concerns prior to this appointment, please call our [phone].

## 2017-06-27 NOTE — Telephone Encounter (Signed)
Call made to [facility] at this time. I spoke with [place] and was told that they would have to get the images from patient's last mammogram visit in order to schedule her an appointment. I verbalized understanding and asked if she could call me back with that appointment so that I can contact the patient.

## 2017-06-28 ENCOUNTER — Other Ambulatory Visit: Payer: Self-pay | Admitting: *Deleted

## 2017-06-28 ENCOUNTER — Inpatient Hospital Stay
Admission: RE | Admit: 2017-06-28 | Discharge: 2017-06-28 | Disposition: A | Discharge status: Home / Self Care | Payer: Self-pay | Source: Ambulatory Visit | Attending: *Deleted | Admitting: *Deleted

## 2017-06-28 DIAGNOSIS — Z9289 Personal history of other medical treatment: Secondary | ICD-10-CM

## 2017-07-01 ENCOUNTER — Telehealth: Payer: Self-pay

## 2017-07-01 NOTE — Telephone Encounter (Signed)
Spoke with [place] at [facility] and she was able to get an appointment for the patient for Tuesday the 14th at [place]. I will contact the patient with this information.    Spoke with Ms. Gullett and informed her of her appointment next Tuesday at [place]. I told her that I would call her with a follow up appointment to be seen in our office. Patient verbalized understanding.

## 2017-07-09 ENCOUNTER — Ambulatory Visit
Admission: RE | Admit: 2017-07-09 | Discharge: 2017-07-09 | Disposition: A | Discharge status: Home / Self Care | Payer: Medicare Other | Source: Ambulatory Visit | Attending: Surgery | Admitting: Surgery

## 2017-07-09 DIAGNOSIS — N644 Mastodynia: Secondary | ICD-10-CM

## 2017-07-09 DIAGNOSIS — R921 Mammographic calcification found on diagnostic imaging of breast: Secondary | ICD-10-CM | POA: Diagnosis not present

## 2017-07-10 ENCOUNTER — Ambulatory Visit: Payer: Self-pay | Admitting: Surgery

## 2017-07-11 ENCOUNTER — Encounter: Payer: Self-pay | Admitting: Surgery

## 2017-07-11 ENCOUNTER — Ambulatory Visit (INDEPENDENT_AMBULATORY_CARE_PROVIDER_SITE_OTHER): Payer: Medicare Other | Admitting: Surgery

## 2017-07-11 VITALS — BP 140/90 | HR 78 | Temp 98.0°F | Wt 130.0 lb

## 2017-07-11 DIAGNOSIS — N644 Mastodynia: Secondary | ICD-10-CM

## 2017-07-11 NOTE — Patient Instructions (Addendum)
We will call you in 6 months to make sure that you get a mammogram.

## 2017-07-11 NOTE — Progress Notes (Signed)
Outpatient Surgical Follow Up  07/11/2017  Michele Nash is an 61 y.o. female.   CC: Mastodynia  HPI: This a patient with a history of mastodynia. She was sent for mammography with a BI-RADS 3 which is been personally reviewed. Her breast pain and swelling is completely resolved and she has no complaints at this time.  Past Medical History:  Diagnosis Date  . Altered mental status   . Anemia   . Apnea, sleep    for sleep study 10/17/15-no cpap yet  . Arthritis   . Bronchitis   . Chronic kidney disease   . COPD (chronic obstructive pulmonary disease) ([place])   . Depression   . Dialysis patient ([place]) 2014  . GERD (gastroesophageal reflux disease)   . GIB (gastrointestinal bleeding) 07/09/2016  . Headache   . Hyperlipidemia   . Hypertension   . Obesity   . Pulmonary edema 09/14/2016  . Renal dialysis device, implant, or graft complication    RIGHT CHEST CATH  . Respiratory failure ([place]) 07/23/2016  . Traumatic hematoma of forehead   . Type II diabetes mellitus ([place]) 09/21/2009   diet controlled    Past Surgical History:  Procedure Laterality Date  . ABDOMINAL HYSTERECTOMY    . COLONOSCOPY  2011  . COLONOSCOPY WITH PROPOFOL N/A 09/20/2015   Procedure: COLONOSCOPY WITH PROPOFOL;  Surgeon: Lucilla Lame, MD;  Location: [facility] ENDOSCOPY;  Service: Endoscopy;  Laterality: N/A;  . DIALYSIS FISTULA CREATION    . ESOPHAGOGASTRODUODENOSCOPY N/A 07/13/2016   Procedure: ESOPHAGOGASTRODUODENOSCOPY (EGD);  Surgeon: Lollie Sails, MD;  Location: [facility] ENDOSCOPY;  Service: Endoscopy;  Laterality: N/A;  . ESOPHAGOGASTRODUODENOSCOPY (EGD) WITH PROPOFOL N/A 06/02/2016   Procedure: ESOPHAGOGASTRODUODENOSCOPY (EGD) WITH PROPOFOL;  Surgeon: Manya Silvas, MD;  Location: [facility] ENDOSCOPY;  Service: Endoscopy;  Laterality: N/A;  . LIGATION OF ARTERIOVENOUS  FISTULA Left 05/24/2017   Procedure: LIGATION OF ARTERIOVENOUS  FISTULA ( PERMCATH INSERTION );  Surgeon: Katha Cabal, MD;  Location: [facility]  ORS;  Service: Vascular;  Laterality: Left;  . PERIPHERAL VASCULAR CATHETERIZATION N/A 05/10/2015   Procedure: A/V Shuntogram/Fistulagram;  Surgeon: Katha Cabal, MD;  Location: [place] CV LAB;  Service: Cardiovascular;  Laterality: N/A;  . PERIPHERAL VASCULAR CATHETERIZATION Left 05/10/2015   Procedure: A/V Shunt Intervention;  Surgeon: Katha Cabal, MD;  Location: [place] CV LAB;  Service: Cardiovascular;  Laterality: Left;  . PERIPHERAL VASCULAR CATHETERIZATION Left 08/23/2015   Procedure: A/V Shuntogram/Fistulagram;  Surgeon: Katha Cabal, MD;  Location: [place] CV LAB;  Service: Cardiovascular;  Laterality: Left;  . PERIPHERAL VASCULAR CATHETERIZATION N/A 08/23/2015   Procedure: A/V Shunt Intervention;  Surgeon: Katha Cabal, MD;  Location: [place] CV LAB;  Service: Cardiovascular;  Laterality: N/A;  . PERIPHERAL VASCULAR CATHETERIZATION  08/23/2015   Procedure: Dialysis/Perma Catheter Insertion;  Surgeon: Katha Cabal, MD;  Location: [place] CV LAB;  Service: Cardiovascular;;  . PERIPHERAL VASCULAR CATHETERIZATION N/A 01/03/2016   Procedure: Dialysis/Perma Catheter Removal;  Surgeon: Katha Cabal, MD;  Location: [place] CV LAB;  Service: Cardiovascular;  Laterality: N/A;  . REVISON OF ARTERIOVENOUS FISTULA Left 10/28/2015   Procedure: REVISON OF ARTERIOVENOUS FISTULA WITH ARTEGRAFT;  Surgeon: Katha Cabal, MD;  Location: [facility] ORS;  Service: Vascular;  Laterality: Left;  . WOUND DEBRIDEMENT Left 10/28/2015   Procedure: Resection of shoulder cyst ( left );  Surgeon: Katha Cabal, MD;  Location: [facility] ORS;  Service: Vascular;  Laterality: Left;    Family History  Problem  Relation Age of Onset  . Heart disease Mother   . Cancer Father   . Cancer Sister   . Breast cancer Sister     Social History:  reports that she has been smoking Cigarettes.  She has smoked for the past 40.00 years. She has never used smokeless tobacco.  She reports that she does not drink alcohol or use drugs.  Allergies:  Allergies  Allergen Reactions  . Chantix [Varenicline]     hallucinations  . Sulfa Antibiotics Itching, Swelling, Rash and Other (See Comments)    Reaction:  Facial/body swelling     Medications reviewed.   Review of Systems:   Review of Systems  Constitutional: Negative.   HENT: Negative.   Eyes: Negative.   Respiratory: Negative.   Cardiovascular: Negative.   Gastrointestinal: Negative.   Genitourinary: Negative.   Musculoskeletal: Negative.   Skin: Negative.   Neurological: Negative.   Endo/Heme/Allergies: Negative.   Psychiatric/Behavioral: Negative.      Physical Exam:  There were no vitals taken for this visit.  Physical Exam  Constitutional: She is oriented to person, place, and time and well-developed, well-nourished, and in no distress. No distress.  HENT:  Head: Normocephalic and atraumatic.  Eyes: Pupils are equal, round, and reactive to light. Right eye exhibits no discharge. Left eye exhibits no discharge. No scleral icterus.  Neck: Normal range of motion. No JVD present.  Cardiovascular: Normal rate, regular rhythm and normal heart sounds.   Pulmonary/Chest: Effort normal and breath sounds normal. No respiratory distress. She has no wheezes. She has no rales.  Dialysis catheter right side  Abdominal: Soft. She exhibits no distension. There is no tenderness. There is no rebound and no guarding.  Musculoskeletal: Normal range of motion. She exhibits no edema or tenderness.  Lymphadenopathy:    She has no cervical adenopathy.  Neurological: She is alert and oriented to person, place, and time.  Skin: Skin is warm and dry. No rash noted. She is not diaphoretic. No erythema.  Psychiatric: Mood and affect normal.  Vitals reviewed.   Bilateral breast exam is performed. Scars are noted but no palpable mass and no tenderness. Mammogram is personally reviewed as his ultrasound. There is  no sign of malignancy. No results found for this or any previous visit (from the past 48 hour(s)). US Breast Ltd Uni Left Inc Axilla  Result Date: 07/09/2017 CLINICAL DATA:  Bilateral milky nipple discharge. Fluid-filled nodules in the right retroareolar breast noted by the patient. EXAM: 2D DIGITAL DIAGNOSTIC BILATERAL MAMMOGRAM WITH CAD AND ADJUNCT TOMO ULTRASOUND BILATERAL BREAST COMPARISON:  Previous exam(s). ACR Breast Density Category d: The breast tissue is extremely dense, which lowers the sensitivity of mammography. FINDINGS: Mammographically, there are no suspicious masses or areas of architectural distortion. A faint group of calcifications is seen in the right breast slightly upper outer quadrant, middle to posterior depth. These calcifications are best seen on the 3D images and could not be reproduced on the magnification views, despite multiple attempts. Mammographic images were processed with CAD. On physical exam, no suspicious masses are palpated. Prominent glands of Kinnie Scales are seen bilaterally. Targeted bilateral breast ultrasound is performed, showing no suspicious masses or shadowing lesions. No evidence of significant duct ectasia. IMPRESSION: No mammographic evidence of malignancy in the left breast. Extremely faint calcifications in the right breast slightly upper outer quadrant, for which six-month follow-up is recommended. RECOMMENDATION: Further management of patient's bilateral nipple discharge should be based on clinical grounds. Otherwise, Diagnostic mammogram of the right  breast in 6 months. (Code:DM-R-62M) I have discussed the findings and recommendations with the patient. Results were also provided in writing at the conclusion of the visit. If applicable, a reminder letter will be sent to the patient regarding the next appointment. BI-RADS CATEGORY  3: Probably benign. Electronically Signed   By: Fidela Salisbury M.D.   On: 07/09/2017 14:01   US Breast Ltd Uni Right Inc  Axilla  Result Date: 07/09/2017 CLINICAL DATA:  Bilateral milky nipple discharge. Fluid-filled nodules in the right retroareolar breast noted by the patient. EXAM: 2D DIGITAL DIAGNOSTIC BILATERAL MAMMOGRAM WITH CAD AND ADJUNCT TOMO ULTRASOUND BILATERAL BREAST COMPARISON:  Previous exam(s). ACR Breast Density Category d: The breast tissue is extremely dense, which lowers the sensitivity of mammography. FINDINGS: Mammographically, there are no suspicious masses or areas of architectural distortion. A faint group of calcifications is seen in the right breast slightly upper outer quadrant, middle to posterior depth. These calcifications are best seen on the 3D images and could not be reproduced on the magnification views, despite multiple attempts. Mammographic images were processed with CAD. On physical exam, no suspicious masses are palpated. Prominent glands of Kinnie Scales are seen bilaterally. Targeted bilateral breast ultrasound is performed, showing no suspicious masses or shadowing lesions. No evidence of significant duct ectasia. IMPRESSION: No mammographic evidence of malignancy in the left breast. Extremely faint calcifications in the right breast slightly upper outer quadrant, for which six-month follow-up is recommended. RECOMMENDATION: Further management of patient's bilateral nipple discharge should be based on clinical grounds. Otherwise, Diagnostic mammogram of the right breast in 6 months. (Code:[id number]-62M) I have discussed the findings and recommendations with the patient. Results were also provided in writing at the conclusion of the visit. If applicable, a reminder letter will be sent to the patient regarding the next appointment. BI-RADS CATEGORY  3: Probably benign. Electronically Signed   By: Fidela Salisbury M.D.   On: 07/09/2017 14:01    Assessment/Plan:  This patient with a history of mastodynia but her pain is completely gone and she has no further swelling. Studies are been personally  reviewed. Again recommend follow-up in 6 months with new mammogram. This is in agreement with the radiology recommendations. Patient understands return sooner should she have any changes to her self exams.  Florene Glen, MD, FACS

## 2017-08-04 ENCOUNTER — Emergency Department
Admission: EM | Admit: 2017-08-04 | Discharge: 2017-08-04 | Disposition: A | Discharge status: Home / Self Care | Payer: Medicare Other | Attending: Emergency Medicine | Admitting: Emergency Medicine

## 2017-08-04 ENCOUNTER — Emergency Department: Payer: Medicare Other

## 2017-08-04 DIAGNOSIS — J449 Chronic obstructive pulmonary disease, unspecified: Secondary | ICD-10-CM | POA: Insufficient documentation

## 2017-08-04 DIAGNOSIS — I509 Heart failure, unspecified: Secondary | ICD-10-CM | POA: Diagnosis not present

## 2017-08-04 DIAGNOSIS — N189 Chronic kidney disease, unspecified: Secondary | ICD-10-CM | POA: Insufficient documentation

## 2017-08-04 DIAGNOSIS — F17210 Nicotine dependence, cigarettes, uncomplicated: Secondary | ICD-10-CM | POA: Diagnosis not present

## 2017-08-04 DIAGNOSIS — Z79899 Other long term (current) drug therapy: Secondary | ICD-10-CM | POA: Insufficient documentation

## 2017-08-04 DIAGNOSIS — R202 Paresthesia of skin: Secondary | ICD-10-CM | POA: Insufficient documentation

## 2017-08-04 DIAGNOSIS — E119 Type 2 diabetes mellitus without complications: Secondary | ICD-10-CM | POA: Insufficient documentation

## 2017-08-04 DIAGNOSIS — I130 Hypertensive heart and chronic kidney disease with heart failure and stage 1 through stage 4 chronic kidney disease, or unspecified chronic kidney disease: Secondary | ICD-10-CM | POA: Diagnosis not present

## 2017-08-04 DIAGNOSIS — M79605 Pain in left leg: Secondary | ICD-10-CM | POA: Diagnosis not present

## 2017-08-04 LAB — CBC
HCT: 31.1 % — ABNORMAL LOW (ref 35.0–47.0)
HEMOGLOBIN: 10.4 g/dL — AB (ref 12.0–16.0)
MCH: 34.3 pg — ABNORMAL HIGH (ref 26.0–34.0)
MCHC: 33.4 g/dL (ref 32.0–36.0)
MCV: 102.4 fL — ABNORMAL HIGH (ref 80.0–100.0)
Platelets: 259 10*3/uL (ref 150–440)
RBC: 3.04 MIL/uL — AB (ref 3.80–5.20)
RDW: 14.1 % (ref 11.5–14.5)
WBC: 7.1 10*3/uL (ref 3.6–11.0)

## 2017-08-04 LAB — COMPREHENSIVE METABOLIC PANEL
ALBUMIN: 3.6 g/dL (ref 3.5–5.0)
ALT: 12 U/L — AB (ref 14–54)
ANION GAP: 11 (ref 5–15)
AST: 17 U/L (ref 15–41)
Alkaline Phosphatase: 131 U/L — ABNORMAL HIGH (ref 38–126)
BILIRUBIN TOTAL: 0.6 mg/dL (ref 0.3–1.2)
BUN: 51 mg/dL — ABNORMAL HIGH (ref 6–20)
CHLORIDE: 101 mmol/L (ref 101–111)
CO2: 27 mmol/L (ref 22–32)
Calcium: 7.8 mg/dL — ABNORMAL LOW (ref 8.9–10.3)
Creatinine, Ser: 8.28 mg/dL — ABNORMAL HIGH (ref 0.44–1.00)
GFR calc non Af Amer: 5 mL/min — ABNORMAL LOW (ref >60)
GFR, EST AFRICAN AMERICAN: 5 mL/min — AB (ref >60)
GLUCOSE: 96 mg/dL (ref 65–99)
Potassium: 4.5 mmol/L (ref 3.5–5.1)
SODIUM: 139 mmol/L (ref 135–145)
Total Protein: 6.6 g/dL (ref 6.5–8.1)

## 2017-08-04 MED ORDER — GABAPENTIN 100 MG PO CAPS
200.0000 mg | ORAL_CAPSULE | Freq: Once | ORAL | Status: AC
  Administered 2017-08-04: 200 mg via ORAL
  Filled 2017-08-04 (×2): qty 2

## 2017-08-04 MED ORDER — TRAMADOL HCL 50 MG PO TABS: 50.0000 mg | ORAL_TABLET | Freq: Two times a day (BID) | ORAL | 0 refills | Status: AC | PRN

## 2017-08-04 MED ORDER — TRAMADOL HCL 50 MG PO TABS
50.0000 mg | ORAL_TABLET | Freq: Once | ORAL | Status: AC
  Administered 2017-08-04: 50 mg via ORAL
  Filled 2017-08-04: qty 1

## 2017-08-04 NOTE — ED Triage Notes (Signed)
Patient c/o left leg pain beginning yesterday. Patient reports pain radiates down entire leg. Pt describes pain as cramping, sharp, pins and needles.  Pt reports similar episode episode with shoulder pain in the past and being diagnosed with severe electrolyte imbalance requiring emergency dialysis.

## 2017-08-04 NOTE — ED Notes (Signed)
Pt. States when she woke up yesterday morning she felt pins and needle sensation down lt. Leg.  Pt. Is a dialysis pt.  Pt. States she had last dialysis on Friday and it went well.  Pt. Denies similar pain in the past.

## 2017-08-04 NOTE — ED Provider Notes (Signed)
[facility] Emergency Department Provider Note ____________________________________________   First MD Initiated Contact with Patient 08/04/17 [phone]     (approximate)  I have reviewed the triage vital signs and the nursing notes.   HISTORY  Chief Complaint Leg Pain    HPI Michele Nash is a 61 y.o. female with a history of end-stage renal disease on dialysis, other past medical history as noted who presents with left leg pain for 1 day, acute onset, mainly in the thigh and sometimes radiating down to the lower leg, described as cramping and sharp, and associated with tingling. Patient denies numbness or weakness. No proceeding trauma or injury. No prior history of this pain. Patient states that she did have shoulder pain in the past related to electrolyte abnormalities but that was different. Patient last had her dialysis on Friday 2 days ago.    Past Medical History:  Diagnosis Date  . Altered mental status   . Anemia   . Apnea, sleep    for sleep study 10/17/15-no cpap yet  . Arthritis   . Bronchitis   . Chronic kidney disease   . COPD (chronic obstructive pulmonary disease) ([place])   . Depression   . Dialysis patient ([place]) 2014  . GERD (gastroesophageal reflux disease)   . GIB (gastrointestinal bleeding) 07/09/2016  . Headache   . Hyperlipidemia   . Hypertension   . Obesity   . Pulmonary edema 09/14/2016  . Renal dialysis device, implant, or graft complication    RIGHT CHEST CATH  . Respiratory failure ([place]) 07/23/2016  . Traumatic hematoma of forehead   . Type II diabetes mellitus ([place]) 09/21/2009   diet controlled    Patient Active Problem List   Diagnosis Date Noted  . UTI (urinary tract infection) 06/17/2017  . Dizziness 06/15/2017  . Chronic diastolic CHF (congestive heart failure) ([place]) 06/15/2017  . Hyperkalemia 05/21/2017  . Respiratory failure, acute and chronic ([place]) 12/04/2016  . Multiple closed fractures of ribs of right  side   . Hyperlipidemia 11/28/2016  . COPD exacerbation ([place]) 10/29/2016  . Altered mental status   . Fall   . Traumatic hematoma of forehead   . Acute respiratory failure with hypercapnia ([place]) 10/06/2016  . Pulmonary edema 09/14/2016  . Respiratory failure ([place]) 07/23/2016  . GIB (gastrointestinal bleeding) 07/09/2016  . Protein-calorie malnutrition, severe 05/29/2016  . Hyperglycemia 05/28/2016  . Pain in limb 10/28/2015  . ESRD on dialysis ([place]) 10/28/2015  . Essential hypertension 10/28/2015  . ESRD on hemodialysis ([place]) 03/29/2015  . HTN (hypertension) 03/29/2015  . COPD (chronic obstructive pulmonary disease) ([place]) 03/29/2015  . GAD (generalized anxiety disorder) 03/29/2015  . Chronic kidney disease 10/18/2009  . Personal history of tobacco use, presenting hazards to health 10/18/2009  . Type II diabetes mellitus ([place]) 09/21/2009  . Disorder of uterus 02/14/1990    Past Surgical History:  Procedure Laterality Date  . ABDOMINAL HYSTERECTOMY    . COLONOSCOPY  2011  . COLONOSCOPY WITH PROPOFOL N/A 09/20/2015   Procedure: COLONOSCOPY WITH PROPOFOL;  Surgeon: Lucilla Lame, MD;  Location: [facility] ENDOSCOPY;  Service: Endoscopy;  Laterality: N/A;  . DIALYSIS FISTULA CREATION    . ESOPHAGOGASTRODUODENOSCOPY N/A 07/13/2016   Procedure: ESOPHAGOGASTRODUODENOSCOPY (EGD);  Surgeon: Lollie Sails, MD;  Location: [facility] ENDOSCOPY;  Service: Endoscopy;  Laterality: N/A;  . ESOPHAGOGASTRODUODENOSCOPY (EGD) WITH PROPOFOL N/A 06/02/2016   Procedure: ESOPHAGOGASTRODUODENOSCOPY (EGD) WITH PROPOFOL;  Surgeon: Manya Silvas, MD;  Location: [facility] ENDOSCOPY;  Service: Endoscopy;  Laterality: N/A;  .  LIGATION OF ARTERIOVENOUS  FISTULA Left 05/24/2017   Procedure: LIGATION OF ARTERIOVENOUS  FISTULA ( PERMCATH INSERTION );  Surgeon: Katha Cabal, MD;  Location: [facility] ORS;  Service: Vascular;  Laterality: Left;  . PERIPHERAL VASCULAR CATHETERIZATION N/A 05/10/2015   Procedure: A/V  Shuntogram/Fistulagram;  Surgeon: Katha Cabal, MD;  Location: [place] CV LAB;  Service: Cardiovascular;  Laterality: N/A;  . PERIPHERAL VASCULAR CATHETERIZATION Left 05/10/2015   Procedure: A/V Shunt Intervention;  Surgeon: Katha Cabal, MD;  Location: [place] CV LAB;  Service: Cardiovascular;  Laterality: Left;  . PERIPHERAL VASCULAR CATHETERIZATION Left 08/23/2015   Procedure: A/V Shuntogram/Fistulagram;  Surgeon: Katha Cabal, MD;  Location: [place] CV LAB;  Service: Cardiovascular;  Laterality: Left;  . PERIPHERAL VASCULAR CATHETERIZATION N/A 08/23/2015   Procedure: A/V Shunt Intervention;  Surgeon: Katha Cabal, MD;  Location: [place] CV LAB;  Service: Cardiovascular;  Laterality: N/A;  . PERIPHERAL VASCULAR CATHETERIZATION  08/23/2015   Procedure: Dialysis/Perma Catheter Insertion;  Surgeon: Katha Cabal, MD;  Location: [place] CV LAB;  Service: Cardiovascular;;  . PERIPHERAL VASCULAR CATHETERIZATION N/A 01/03/2016   Procedure: Dialysis/Perma Catheter Removal;  Surgeon: Katha Cabal, MD;  Location: [place] CV LAB;  Service: Cardiovascular;  Laterality: N/A;  . REVISON OF ARTERIOVENOUS FISTULA Left 10/28/2015   Procedure: REVISON OF ARTERIOVENOUS FISTULA WITH ARTEGRAFT;  Surgeon: Katha Cabal, MD;  Location: [facility] ORS;  Service: Vascular;  Laterality: Left;  . WOUND DEBRIDEMENT Left 10/28/2015   Procedure: Resection of shoulder cyst ( left );  Surgeon: Katha Cabal, MD;  Location: [facility] ORS;  Service: Vascular;  Laterality: Left;    Prior to Admission medications   Medication Sig Start Date End Date Taking? Authorizing Provider  albuterol (PROVENTIL HFA;VENTOLIN HFA) 108 (90 Base) MCG/ACT inhaler Inhale 2 puffs into the lungs every 6 (six) hours as needed for wheezing or shortness of breath. 12/11/16   Gouru, Illene Silver, MD  ALPRAZolam Duanne Moron) 0.5 MG tablet Take 0.5 mg by mouth 2 (two) times daily.    [provider]    Amino Acids-Protein Hydrolys (FEEDING SUPPLEMENT, PRO-STAT SUGAR FREE 64,) LIQD Take 30 mLs by mouth daily. 12/12/16   Gouru, Illene Silver, MD  amLODipine (NORVASC) 10 MG tablet Take 10 mg by mouth daily.     [provider]  amoxicillin-clavulanate (AUGMENTIN) 500-125 MG tablet Take 1 tablet (500 mg total) by mouth at bedtime. Patient not taking: Reported on 08/04/2017 06/20/17   Loletha Grayer, MD  calcium acetate (PHOSLO) 667 MG capsule Take 3 capsules (2,001 mg total) by mouth 3 (three) times daily with meals. 06/20/17   Loletha Grayer, MD  carvedilol (COREG) 12.5 MG tablet Take 12.5 mg by mouth 2 (two) times daily with a meal.    [provider]  ezetimibe (ZETIA) 10 MG tablet Take 10 mg by mouth daily.     [provider]  feeding supplement, ENSURE ENLIVE, (ENSURE ENLIVE) LIQD Take 237 mLs by mouth 3 (three) times daily between meals. 10/11/16   [name], MD  fluticasone (FLONASE) 50 MCG/ACT nasal spray Place 2 sprays into both nostrils daily as needed for rhinitis.    [provider]  furosemide (LASIX) 40 MG tablet Take 40 mg by mouth every Tuesday, Thursday, Saturday, and Sunday.     [provider]  hydrALAZINE (APRESOLINE) 50 MG tablet Take 1 tablet (50 mg total) by mouth 3 (three) times daily. 06/20/17   Loletha Grayer, MD  ipratropium-albuterol (DUONEB) 0.5-2.5 (3) MG/3ML  SOLN Take 3 mLs by nebulization every 6 (six) hours as needed. 12/11/16   Nicholes Mango, MD  lidocaine-prilocaine (EMLA) cream Apply 1 application topically as needed (prior to accessing port).    [provider]  losartan (COZAAR) 100 MG tablet Take 100 mg by mouth daily.     [provider]  multivitamin (RENA-VIT) TABS tablet Take 1 tablet by mouth at bedtime.     [provider]  ondansetron (ZOFRAN) 4 MG tablet Take 1 tablet (4 mg total) by mouth every 8 (eight) hours as needed for nausea or vomiting. 03/24/17   Lisa Roca, MD   pantoprazole (PROTONIX) 40 MG tablet Take 40 mg by mouth 2 (two) times daily.    [provider]  sertraline (ZOLOFT) 50 MG tablet Take 50 mg by mouth daily.     [provider]  traMADol (ULTRAM) 50 MG tablet Take 1 tablet (50 mg total) by mouth every 12 (twelve) hours as needed for severe pain. 08/04/17 08/04/18  Arta Silence, MD    Allergies Chantix [varenicline] and Sulfa antibiotics  Family History  Problem Relation Age of Onset  . Heart disease Mother   . Cancer Father   . Cancer Sister   . Breast cancer Sister     Social History Social History  Substance Use Topics  . Smoking status: Light Tobacco Smoker    Years: 40.00    Types: Cigarettes  . Smokeless tobacco: Never Used  . Alcohol use No    Review of Systems  Constitutional: No fever/chills Eyes: No visual changes. ENT: No sore throat. Cardiovascular: Denies chest pain. Respiratory: Denies shortness of breath. Gastrointestinal: No nausea, no vomiting.  No diarrhea.  Genitourinary: Negative for dysuria.  Musculoskeletal: Negative for back pain. Positive for left leg pain. Skin: Negative for rash. Neurological: Negative for headaches, focal weakness or numbness.   ____________________________________________   PHYSICAL EXAM:  VITAL SIGNS: ED Triage Vitals  Enc Vitals Group     BP 08/04/17 0635 (!) 196/95     Pulse Rate 08/04/17 0635 68     Resp 08/04/17 0635 20     Temp 08/04/17 0635 98.1 F (36.7 C)     Temp Source 08/04/17 0635 Oral     SpO2 08/04/17 0635 99 %     Weight 08/04/17 0637 130 lb (59 kg)     Height 08/04/17 0637 5\' 5"  (1.651 m)     Head Circumference --      Peak Flow --      Pain Score 08/04/17 0636 9     Pain Loc --      Pain Edu? --      Excl. in [place]? --     Constitutional: Alert and oriented. Well appearing and in no acute distress. Eyes: Conjunctivae are normal.  Head: Atraumatic. Nose: No congestion/rhinnorhea. Mouth/Throat: Mucous membranes are  moist.   Neck: Normal range of motion.  Cardiovascular: Good peripheral circulation. Respiratory: Normal respiratory effort.  No retractions. Gastrointestinal: No distention.  Genitourinary: No CVA tenderness. Musculoskeletal: No lower extremity edema.  Extremities warm and well perfused. left lower extremity with tenderness to the anterior thigh, no tenderness to the calf or posterior thigh. Full range of motion all joints. 2+ DP pulse and cap refill less than 2 seconds. I'm touch sensory and motor intact throughout the leg. No rash, erythema, induration, warmth, or swelling. Neurologic:  Normal speech and language. No gross focal neurologic deficits are appreciated.  Skin:  Skin is warm and dry.  No rash noted. Psychiatric: Mood and affect are normal. Speech and behavior are normal.  ____________________________________________   LABS (all labs ordered are listed, but only abnormal results are displayed)  Labs Reviewed  CBC - Abnormal; Notable for the following:       Result Value   RBC 3.04 (*)    Hemoglobin 10.4 (*)    HCT 31.1 (*)    MCV 102.4 (*)    MCH 34.3 (*)    All other components within normal limits  COMPREHENSIVE METABOLIC PANEL - Abnormal; Notable for the following:    BUN 51 (*)    Creatinine, Ser 8.28 (*)    Calcium 7.8 (*)    ALT 12 (*)    Alkaline Phosphatase 131 (*)    GFR calc non Af Amer 5 (*)    GFR calc Af Amer 5 (*)    All other components within normal limits   ____________________________________________  EKG  ED ECG REPORT I, Arta Silence, the attending physician, personally viewed and interpreted this ECG.  Date: 08/04/2017 EKG Time: 7:42 Rate: 60 Rhythm: normal sinus rhythm QRS Axis: normal Intervals: normal ST/T Wave abnormalities: normal, no peaked T waves Narrative Interpretation: no evidence of acute  ischemia  ____________________________________________  RADIOLOGY    ____________________________________________   PROCEDURES  Procedure(s) performed: No    Critical Care performed: No ____________________________________________   INITIAL IMPRESSION / ASSESSMENT AND PLAN / ED COURSE  Pertinent labs & imaging results that were available during my care of the patient were reviewed by me and considered in my medical decision making (see chart for details).  61 year old female with history of end-stage renal disease on dialysis presents with atraumatic left leg pain for 1 day, mainly in the thigh but radiating down the leg, sharp and associated with intermittent paresthesias. No weakness or numbness. Vital signs are normal except for hypertension, patient is comfortable appearing, and exam is as described. There is tenderness to the anterior thigh but otherwise exam of the left lower extremity is normal. Overall suspect most likely neuropathic pain versus sciatica or radiculopathy.  Low suspicion for DVT given that there is no swelling. Also consider cramping from electrolyte abnormality. No e/o infectious cause.  plan: Basic labs, EKG, DVT study, and symptomatic treatment with gabapentin then reassess.    ----------------------------------------- 10:11 AM on 08/04/2017 -----------------------------------------  Patient states pain is minimally improved after the gabapentin, however she feels comfortable to go home.sound is negative. Patient's lab work reveals hypocalcemia however patient runs at approximately 8.1 normally based on prior labs, so this is not a significant change from her baseline.  I do not suspect that this isolated leg pain is due to symptomatic hypocalcemia.  patient has used tramadol in the past successfully for pain so I will order a dose here and write her prescription for same.  Will avoid NSAIDs given patient's ESRD.  She is instructed to follow up with her  primary care doctor as well as with a neurologist if her symptoms continue.  Return precautions given.  Patient will have her dialysis tomorrow.  ____________________________________________   FINAL CLINICAL IMPRESSION(S) / ED DIAGNOSES  Final diagnoses:  Left leg pain      NEW MEDICATIONS STARTED DURING THIS VISIT:  New Prescriptions   TRAMADOL (ULTRAM) 50 MG TABLET    Take 1 tablet (50 mg total) by mouth every 12 (twelve) hours as needed for severe pain.     Note:  This document was prepared using Systems analyst and  may include unintentional dictation errors.    Arta Silence, MD 08/04/17 1023

## 2017-08-04 NOTE — Discharge Instructions (Signed)
Your pain is most likely due to neuropathy, or other nerve related pain. Return to the ER immediately for new or worsening pain, weakness or numbness, inability to walk or bear weight on the leg, swelling, rash, fever or any other new or worsening symptoms that concern you. You should follow up with your primary care doctor and if the pain persists should follow-up with a neurologist.

## 2017-08-04 NOTE — ED Notes (Signed)
Patient transported to CT 

## 2017-08-11 ENCOUNTER — Encounter: Payer: Self-pay | Admitting: Emergency Medicine

## 2017-08-11 ENCOUNTER — Emergency Department: Payer: Medicare Other

## 2017-08-11 ENCOUNTER — Inpatient Hospital Stay
Admission: EM | Admit: 2017-08-11 | Discharge: 2017-08-13 | DRG: 291 | Disposition: A | Discharge status: Home / Self Care | Payer: Medicare Other | Attending: Internal Medicine | Admitting: Internal Medicine

## 2017-08-11 DIAGNOSIS — J9601 Acute respiratory failure with hypoxia: Secondary | ICD-10-CM | POA: Diagnosis present

## 2017-08-11 DIAGNOSIS — J810 Acute pulmonary edema: Secondary | ICD-10-CM | POA: Diagnosis present

## 2017-08-11 DIAGNOSIS — E1122 Type 2 diabetes mellitus with diabetic chronic kidney disease: Secondary | ICD-10-CM | POA: Diagnosis present

## 2017-08-11 DIAGNOSIS — F329 Major depressive disorder, single episode, unspecified: Secondary | ICD-10-CM | POA: Diagnosis present

## 2017-08-11 DIAGNOSIS — E785 Hyperlipidemia, unspecified: Secondary | ICD-10-CM | POA: Diagnosis present

## 2017-08-11 DIAGNOSIS — I132 Hypertensive heart and chronic kidney disease with heart failure and with stage 5 chronic kidney disease, or end stage renal disease: Secondary | ICD-10-CM | POA: Diagnosis present

## 2017-08-11 DIAGNOSIS — Z992 Dependence on renal dialysis: Secondary | ICD-10-CM | POA: Diagnosis not present

## 2017-08-11 DIAGNOSIS — J811 Chronic pulmonary edema: Secondary | ICD-10-CM | POA: Diagnosis present

## 2017-08-11 DIAGNOSIS — J449 Chronic obstructive pulmonary disease, unspecified: Secondary | ICD-10-CM | POA: Diagnosis present

## 2017-08-11 DIAGNOSIS — F17210 Nicotine dependence, cigarettes, uncomplicated: Secondary | ICD-10-CM | POA: Diagnosis present

## 2017-08-11 DIAGNOSIS — K219 Gastro-esophageal reflux disease without esophagitis: Secondary | ICD-10-CM | POA: Diagnosis present

## 2017-08-11 DIAGNOSIS — D631 Anemia in chronic kidney disease: Secondary | ICD-10-CM | POA: Diagnosis present

## 2017-08-11 DIAGNOSIS — Z79899 Other long term (current) drug therapy: Secondary | ICD-10-CM

## 2017-08-11 DIAGNOSIS — N2581 Secondary hyperparathyroidism of renal origin: Secondary | ICD-10-CM | POA: Diagnosis present

## 2017-08-11 DIAGNOSIS — I5033 Acute on chronic diastolic (congestive) heart failure: Secondary | ICD-10-CM

## 2017-08-11 DIAGNOSIS — I5043 Acute on chronic combined systolic (congestive) and diastolic (congestive) heart failure: Secondary | ICD-10-CM | POA: Diagnosis present

## 2017-08-11 DIAGNOSIS — N186 End stage renal disease: Secondary | ICD-10-CM | POA: Diagnosis present

## 2017-08-11 DIAGNOSIS — G4730 Sleep apnea, unspecified: Secondary | ICD-10-CM | POA: Diagnosis present

## 2017-08-11 DIAGNOSIS — I160 Hypertensive urgency: Secondary | ICD-10-CM | POA: Diagnosis present

## 2017-08-11 DIAGNOSIS — J9602 Acute respiratory failure with hypercapnia: Secondary | ICD-10-CM | POA: Diagnosis present

## 2017-08-11 HISTORY — DX: Heart failure, unspecified: I50.9

## 2017-08-11 LAB — BLOOD GAS, VENOUS
ACID-BASE DEFICIT: 3.9 mmol/L — AB (ref 0.0–2.0)
BICARBONATE: 27 mmol/L (ref 20.0–28.0)
Delivery systems: POSITIVE
Expiratory PAP: 6
FIO2: 0.4
INSPIRATORY PAP: 12
LHR: 10 {breaths}/min
O2 SAT: 63.5 %
PATIENT TEMPERATURE: 37
pCO2, Ven: 83 mmHg (ref 44.0–60.0)
pH, Ven: 7.12 — CL (ref 7.250–7.430)
pO2, Ven: 45 mmHg (ref 32.0–45.0)

## 2017-08-11 LAB — BASIC METABOLIC PANEL
Anion gap: 14 (ref 5–15)
BUN: 67 mg/dL — ABNORMAL HIGH (ref 6–20)
CO2: 26 mmol/L (ref 22–32)
CREATININE: 8.07 mg/dL — AB (ref 0.44–1.00)
Calcium: 8.4 mg/dL — ABNORMAL LOW (ref 8.9–10.3)
Chloride: 101 mmol/L (ref 101–111)
GFR calc Af Amer: 5 mL/min — ABNORMAL LOW (ref >60)
GFR calc non Af Amer: 5 mL/min — ABNORMAL LOW (ref >60)
Glucose, Bld: 161 mg/dL — ABNORMAL HIGH (ref 65–99)
Potassium: 5.1 mmol/L (ref 3.5–5.1)
SODIUM: 141 mmol/L (ref 135–145)

## 2017-08-11 LAB — PHOSPHORUS: Phosphorus: 4.6 mg/dL (ref 2.5–4.6)

## 2017-08-11 LAB — CBC WITH DIFFERENTIAL/PLATELET
Basophils Absolute: 0.1 10*3/uL (ref 0–0.1)
Basophils Relative: 1 %
Eosinophils Absolute: 0.4 10*3/uL (ref 0–0.7)
Eosinophils Relative: 5 %
HCT: 35.2 % (ref 35.0–47.0)
HEMOGLOBIN: 11.6 g/dL — AB (ref 12.0–16.0)
LYMPHS ABS: 2.2 10*3/uL (ref 1.0–3.6)
LYMPHS PCT: 27 %
MCH: 33.7 pg (ref 26.0–34.0)
MCHC: 32.9 g/dL (ref 32.0–36.0)
MCV: 102.6 fL — AB (ref 80.0–100.0)
Monocytes Absolute: 0.8 10*3/uL (ref 0.2–0.9)
Monocytes Relative: 10 %
Neutro Abs: 4.7 10*3/uL (ref 1.4–6.5)
Neutrophils Relative %: 57 %
Platelets: 360 10*3/uL (ref 150–440)
RBC: 3.43 MIL/uL — AB (ref 3.80–5.20)
RDW: 14.8 % — ABNORMAL HIGH (ref 11.5–14.5)
WBC: 8.2 10*3/uL (ref 3.6–11.0)

## 2017-08-11 LAB — MRSA PCR SCREENING: MRSA by PCR: NEGATIVE

## 2017-08-11 LAB — TROPONIN I: Troponin I: 0.06 ng/mL (ref <0.03)

## 2017-08-11 MED ORDER — ORAL CARE MOUTH RINSE
15.0000 mL | Freq: Two times a day (BID) | OROMUCOSAL | Status: DC
  Administered 2017-08-12: 15 mL via OROMUCOSAL

## 2017-08-11 MED ORDER — ALPRAZOLAM 0.5 MG PO TABS
0.5000 mg | ORAL_TABLET | Freq: Two times a day (BID) | ORAL | Status: DC
  Administered 2017-08-11 – 2017-08-12 (×2): 0.5 mg via ORAL
  Filled 2017-08-11 (×2): qty 1

## 2017-08-11 MED ORDER — ALBUTEROL SULFATE (2.5 MG/3ML) 0.083% IN NEBU: 3.0000 mL | INHALATION_SOLUTION | Freq: Four times a day (QID) | RESPIRATORY_TRACT | Status: DC | PRN

## 2017-08-11 MED ORDER — FUROSEMIDE 40 MG PO TABS
80.0000 mg | ORAL_TABLET | Freq: Every day | ORAL | Status: DC
  Administered 2017-08-12 – 2017-08-13 (×2): 80 mg via ORAL
  Filled 2017-08-11: qty 2
  Filled 2017-08-11: qty 4

## 2017-08-11 MED ORDER — CHLORHEXIDINE GLUCONATE 0.12 % MT SOLN
15.0000 mL | Freq: Two times a day (BID) | OROMUCOSAL | Status: DC
  Administered 2017-08-12 – 2017-08-13 (×2): 15 mL via OROMUCOSAL
  Filled 2017-08-11 (×3): qty 15

## 2017-08-11 MED ORDER — RENA-VITE PO TABS
1.0000 | ORAL_TABLET | Freq: Every day | ORAL | Status: DC
  Administered 2017-08-12 – 2017-08-13 (×2): 1 via ORAL
  Filled 2017-08-11 (×2): qty 1

## 2017-08-11 MED ORDER — HYDRALAZINE HCL 20 MG/ML IJ SOLN
10.0000 mg | INTRAMUSCULAR | Status: DC | PRN
  Administered 2017-08-12 (×2): 10 mg via INTRAVENOUS
  Filled 2017-08-11 (×3): qty 1

## 2017-08-11 MED ORDER — HYDRALAZINE HCL 50 MG PO TABS
50.0000 mg | ORAL_TABLET | Freq: Three times a day (TID) | ORAL | Status: DC
  Administered 2017-08-12 – 2017-08-13 (×4): 50 mg via ORAL
  Filled 2017-08-11 (×4): qty 1

## 2017-08-11 MED ORDER — ONDANSETRON HCL 4 MG/2ML IJ SOLN
4.0000 mg | Freq: Three times a day (TID) | INTRAMUSCULAR | Status: DC | PRN
Start: 2017-08-11 — End: 2017-08-13
  Administered 2017-08-11: 4 mg via INTRAVENOUS

## 2017-08-11 MED ORDER — CALCIUM CARBONATE ANTACID 500 MG PO CHEW
1.0000 | CHEWABLE_TABLET | Freq: Every day | ORAL | Status: DC
  Administered 2017-08-12 – 2017-08-13 (×2): 200 mg via ORAL
  Filled 2017-08-11 (×2): qty 1

## 2017-08-11 MED ORDER — EZETIMIBE 10 MG PO TABS
10.0000 mg | ORAL_TABLET | Freq: Every day | ORAL | Status: DC
  Administered 2017-08-12 – 2017-08-13 (×2): 10 mg via ORAL
  Filled 2017-08-11 (×2): qty 1

## 2017-08-11 MED ORDER — ONDANSETRON HCL 4 MG/2ML IJ SOLN
INTRAMUSCULAR | Status: AC
  Administered 2017-08-11: 4 mg via INTRAVENOUS
  Filled 2017-08-11: qty 2

## 2017-08-11 MED ORDER — ENSURE ENLIVE PO LIQD
237.0000 mL | Freq: Three times a day (TID) | ORAL | Status: DC
  Administered 2017-08-11 – 2017-08-13 (×5): 237 mL via ORAL

## 2017-08-11 MED ORDER — CALCIUM ACETATE (PHOS BINDER) 667 MG PO CAPS
2001.0000 mg | ORAL_CAPSULE | Freq: Three times a day (TID) | ORAL | Status: DC
  Administered 2017-08-12 – 2017-08-13 (×4): 2001 mg via ORAL
  Filled 2017-08-11 (×5): qty 3

## 2017-08-11 MED ORDER — LOSARTAN POTASSIUM 50 MG PO TABS
100.0000 mg | ORAL_TABLET | Freq: Every day | ORAL | Status: DC
  Administered 2017-08-12 – 2017-08-13 (×3): 100 mg via ORAL
  Filled 2017-08-11 (×3): qty 2

## 2017-08-11 MED ORDER — HEPARIN SODIUM (PORCINE) 5000 UNIT/ML IJ SOLN
5000.0000 [IU] | Freq: Three times a day (TID) | INTRAMUSCULAR | Status: DC
  Filled 2017-08-11 (×2): qty 1

## 2017-08-11 MED ORDER — TRAMADOL HCL 50 MG PO TABS
50.0000 mg | ORAL_TABLET | Freq: Two times a day (BID) | ORAL | Status: DC | PRN
  Administered 2017-08-11 – 2017-08-13 (×2): 50 mg via ORAL
  Filled 2017-08-11 (×2): qty 1

## 2017-08-11 MED ORDER — CARVEDILOL 12.5 MG PO TABS
12.5000 mg | ORAL_TABLET | Freq: Two times a day (BID) | ORAL | Status: DC
  Administered 2017-08-12 – 2017-08-13 (×2): 12.5 mg via ORAL
  Filled 2017-08-11 (×2): qty 1

## 2017-08-11 MED ORDER — DOCUSATE SODIUM 100 MG PO CAPS: 100.0000 mg | ORAL_CAPSULE | Freq: Two times a day (BID) | ORAL | Status: DC | PRN

## 2017-08-11 MED ORDER — IPRATROPIUM-ALBUTEROL 0.5-2.5 (3) MG/3ML IN SOLN
9.0000 mL | Freq: Once | RESPIRATORY_TRACT | Status: AC
  Administered 2017-08-11: 9 mL via RESPIRATORY_TRACT

## 2017-08-11 MED ORDER — PANTOPRAZOLE SODIUM 40 MG PO TBEC
40.0000 mg | DELAYED_RELEASE_TABLET | Freq: Two times a day (BID) | ORAL | Status: DC
  Administered 2017-08-11 – 2017-08-13 (×4): 40 mg via ORAL
  Filled 2017-08-11 (×4): qty 1

## 2017-08-11 MED ORDER — IPRATROPIUM-ALBUTEROL 0.5-2.5 (3) MG/3ML IN SOLN: 3.0000 mL | Freq: Four times a day (QID) | RESPIRATORY_TRACT | Status: DC | PRN

## 2017-08-11 MED ORDER — NITROGLYCERIN 0.4 MG SL SUBL
0.4000 mg | SUBLINGUAL_TABLET | SUBLINGUAL | Status: DC | PRN
  Administered 2017-08-11: 0.4 mg via SUBLINGUAL

## 2017-08-11 MED ORDER — NITROGLYCERIN IN D5W 200-5 MCG/ML-% IV SOLN
0.0000 ug/min | Freq: Once | INTRAVENOUS | Status: AC
  Administered 2017-08-11: 80 ug/min via INTRAVENOUS

## 2017-08-11 MED ORDER — SERTRALINE HCL 50 MG PO TABS
50.0000 mg | ORAL_TABLET | Freq: Every evening | ORAL | Status: DC
  Administered 2017-08-11 – 2017-08-12 (×2): 50 mg via ORAL
  Filled 2017-08-11 (×2): qty 1

## 2017-08-11 MED ORDER — LOSARTAN POTASSIUM 50 MG PO TABS: 100.0000 mg | ORAL_TABLET | Freq: Every day | ORAL | Status: DC

## 2017-08-11 MED ORDER — HEPARIN SODIUM (PORCINE) 1000 UNIT/ML DIALYSIS
50.0000 [IU]/kg | INTRAMUSCULAR | Status: DC | PRN
  Administered 2017-08-12: 3000 [IU] via INTRAVENOUS_CENTRAL
  Filled 2017-08-11 (×2): qty 3

## 2017-08-11 MED ORDER — AMLODIPINE BESYLATE 10 MG PO TABS
10.0000 mg | ORAL_TABLET | Freq: Every day | ORAL | Status: DC
  Administered 2017-08-12 – 2017-08-13 (×2): 10 mg via ORAL
  Filled 2017-08-11 (×2): qty 1

## 2017-08-11 MED ORDER — METHYLPREDNISOLONE SODIUM SUCC 125 MG IJ SOLR
125.0000 mg | Freq: Once | INTRAMUSCULAR | Status: AC
  Administered 2017-08-11: 125 mg via INTRAVENOUS
  Filled 2017-08-11: qty 2

## 2017-08-11 MED ORDER — FUROSEMIDE 10 MG/ML IJ SOLN
40.0000 mg | Freq: Once | INTRAMUSCULAR | Status: AC
  Administered 2017-08-11: 40 mg via INTRAVENOUS
  Filled 2017-08-11: qty 4

## 2017-08-11 NOTE — Progress Notes (Signed)
eLink Physician-Brief Progress Note Patient Name: Michele Nash DOB: Nov 12, 1956 MRN: [id number]   Date of Service  08/11/2017  HPI/Events of Note  ESRD on HD , flash pulmonary edema, ? Demand ischemia  eICU Interventions  Camera check >> BP ok on NTG gtt  RR stable on bipap 12/6 , 40% Urgent HD planned     Intervention Category Evaluation Type: New Patient Evaluation  Everlyn Farabaugh V. 08/11/2017, 6:03 PM

## 2017-08-11 NOTE — Progress Notes (Signed)
Hd started  

## 2017-08-11 NOTE — Progress Notes (Signed)
Assisted with patient transfer from Er to ICU room 15. Patient transferred on V60 Bipap with no complications.

## 2017-08-11 NOTE — Consult Note (Signed)
PULMONARY / CRITICAL CARE MEDICINE   Name: Michele Nash MRN: [id number] DOB: 05-07-1956    ADMISSION DATE:  08/11/2017   CONSULTATION DATE:  08/11/2017  REFERRING MD:  Dr. Anselm Jungling  Reason: Acute pulmonary edema, volume overload, need for emergent hemodialysis  CHIEF COMPLAINT:  Acute dyspnea  HISTORY OF PRESENT ILLNESS:   Michele Nash is a 61 year old African-American female with a history of end-stage renal disease and multiple comorbidities as indicated below; well known to our service who presented to the ED with complaints of acute dyspnea. Patient states that she was at a particular day before and did not realize that she had consumed a lot of fluids. This afternoon. Has symptoms got worse and hence she decided to call EMS. Upon EMS arrival, patient was hypoxic with SPO2 in the 70s. At the ED, had chest x-ray showed severe pulmonary edema. She is usually goes for hemodialysis Monday, Wednesday and Fridays and states that she did go to all her SESSIONS this week.  Reports improvement in dyspnea with BiPAP. She is in the ICU for emergent hemodialysis.  PAST MEDICAL HISTORY :  She  has a past medical history of Altered mental status; Anemia; Apnea, sleep; Arthritis; Bronchitis; CHF (congestive heart failure) ([place]); Chronic kidney disease; COPD (chronic obstructive pulmonary disease) ([place]); Depression; Dialysis patient [facility]) (2014); GERD (gastroesophageal reflux disease); GIB (gastrointestinal bleeding) (07/09/2016); Headache; Hyperlipidemia; Hypertension; Obesity; Pulmonary edema (09/14/2016); Renal dialysis device, implant, or graft complication; Respiratory failure ([place]) (07/23/2016); Traumatic hematoma of forehead; and Type II diabetes mellitus ([place]) (09/21/2009).  PAST SURGICAL HISTORY: She  has a past surgical history that includes Abdominal hysterectomy; Dialysis fistula creation; Colonoscopy (2011); Cardiac catheterization (N/A, 05/10/2015); Cardiac catheterization (Left, 05/10/2015);  Cardiac catheterization (Left, 08/23/2015); Cardiac catheterization (N/A, 08/23/2015); Cardiac catheterization (08/23/2015); Colonoscopy with propofol (N/A, 09/20/2015); Revison of arteriovenous fistula (Left, 10/28/2015); Wound debridement (Left, 10/28/2015); Cardiac catheterization (N/A, 01/03/2016); Esophagogastroduodenoscopy (egd) with propofol (N/A, 06/02/2016); Esophagogastroduodenoscopy (N/A, 07/13/2016); and Ligation of arteriovenous  fistula (Left, 05/24/2017).  Allergies  Allergen Reactions  . Sulfa Antibiotics Itching, Swelling, Rash and Other (See Comments)    Reaction:  Facial/body swelling     No current facility-administered medications on file prior to encounter.    Current Outpatient Prescriptions on File Prior to Encounter  Medication Sig  . albuterol (PROVENTIL HFA;VENTOLIN HFA) 108 (90 Base) MCG/ACT inhaler Inhale 2 puffs into the lungs every 6 (six) hours as needed for wheezing or shortness of breath.  Marland Kitchen amLODipine (NORVASC) 10 MG tablet Take 10 mg by mouth daily.   . calcium acetate (PHOSLO) 667 MG capsule Take 3 capsules (2,001 mg total) by mouth 3 (three) times daily with meals.  . carvedilol (COREG) 12.5 MG tablet Take 12.5 mg by mouth 2 (two) times daily with a meal.  . ezetimibe (ZETIA) 10 MG tablet Take 10 mg by mouth daily.   . fluticasone (FLONASE) 50 MCG/ACT nasal spray Place 2 sprays into both nostrils daily as needed for rhinitis.  Marland Kitchen ipratropium-albuterol (DUONEB) 0.5-2.5 (3) MG/3ML SOLN Take 3 mLs by nebulization every 6 (six) hours as needed.  . lidocaine-prilocaine (EMLA) cream Apply 1 application topically as needed (prior to accessing port).  . losartan (COZAAR) 100 MG tablet Take 100 mg by mouth daily.   . multivitamin (RENA-VIT) TABS tablet Take 1 tablet by mouth at bedtime.   . ondansetron (ZOFRAN) 4 MG tablet Take 1 tablet (4 mg total) by mouth every 8 (eight) hours as needed for nausea or vomiting.  . pantoprazole (PROTONIX) 40 MG  tablet Take 40 mg by mouth 2  (two) times daily.  . sertraline (ZOLOFT) 50 MG tablet Take 50 mg by mouth daily.   . traMADol (ULTRAM) 50 MG tablet Take 1 tablet (50 mg total) by mouth every 12 (twelve) hours as needed for severe pain.  Marland Kitchen ALPRAZolam (XANAX) 0.5 MG tablet Take 0.5 mg by mouth 2 (two) times daily.  . Amino Acids-Protein Hydrolys (FEEDING SUPPLEMENT, PRO-STAT SUGAR FREE 64,) LIQD Take 30 mLs by mouth daily.  Marland Kitchen amoxicillin-clavulanate (AUGMENTIN) 500-125 MG tablet Take 1 tablet (500 mg total) by mouth at bedtime. (Patient not taking: Reported on 08/04/2017)  . feeding supplement, ENSURE ENLIVE, (ENSURE ENLIVE) LIQD Take 237 mLs by mouth 3 (three) times daily between meals.  . furosemide (LASIX) 40 MG tablet Take 40 mg by mouth every Tuesday, Thursday, Saturday, and Sunday.   . hydrALAZINE (APRESOLINE) 50 MG tablet Take 1 tablet (50 mg total) by mouth 3 (three) times daily. (Patient not taking: Reported on 08/11/2017)    FAMILY HISTORY:  Her indicated that her mother is deceased. She indicated that her father is deceased. She indicated that her sister is deceased.    SOCIAL HISTORY: She  reports that she has been smoking Cigarettes.  She has smoked for the past 40.00 years. She has never used smokeless tobacco. She reports that she does not drink alcohol or use drugs.  REVIEW OF SYSTEMS:   Constitutional: Negative for fever and chills.  HENT: Negative for congestion and rhinorrhea.  Eyes: Negative for redness and visual disturbance.  Respiratory: Positive for shortness of breath and wheezing.  Cardiovascular: Negative for chest pain and palpitations.  Gastrointestinal: Negative  for nausea , vomiting and abdominal pain and  Loose stools Genitourinary: Negative for dysuria and urgency.  Endocrine: Denies polyuria, polyphagia and heat intolerance Musculoskeletal: Negative for myalgias and arthralgias.  Skin: Negative for pallor and wound.  Neurological: Negative for dizziness and headaches   SUBJECTIVE:    VITAL SIGNS: BP (!) 164/86   Pulse 84   Temp (!) 97.4 F (36.3 C) (Axillary)   Resp (!) 21   Ht 5\' 5"  (1.651 m)   Wt 135 lb 12.9 oz (61.6 kg)   SpO2 99%   BMI 22.60 kg/m   HEMODYNAMICS:    VENTILATOR SETTINGS:    INTAKE / OUTPUT: No intake/output data recorded.  PHYSICAL EXAMINATION: General: Chronically ill-looking Neuro: Awake, speech is normal, no focal deficits HEENT:  PERRLA, trachea midline Cardiovascular:  RRR, S1, S2, no murmur, regurg or gallop; anasarca Lungs: Moderate increase in work of breathing, bilateral breath sounds with positive rhonchi and crackles in all lung fields Abdomen:  Soft, nontender, nondistended, normal bowel sounds Musculoskeletal:  Positive range of motion in upper and lower extremities, no deformities Skin: Skin is warm and dry  LABS:  BMET  Recent Labs Lab 08/11/17 1540  NA 141  K 5.1  CL 101  CO2 26  BUN 67*  CREATININE 8.07*  GLUCOSE 161*    Electrolytes  Recent Labs Lab 08/11/17 1540 08/11/17 1832  CALCIUM 8.4*  --   PHOS  --  4.6    CBC  Recent Labs Lab 08/11/17 1540  WBC 8.2  HGB 11.6*  HCT 35.2  PLT 360    Coag's No results for input(s): APTT, INR in the last 168 hours.  Sepsis Markers No results for input(s): LATICACIDVEN, PROCALCITON, O2SATVEN in the last 168 hours.  ABG No results for input(s): PHART, PCO2ART, PO2ART in the last 168 hours.  Liver Enzymes No results for input(s): AST, ALT, ALKPHOS, BILITOT, ALBUMIN in the last 168 hours.  Cardiac Enzymes  Recent Labs Lab 08/11/17 1540  TROPONINI 0.06*    Glucose No results for input(s): GLUCAP in the last 168 hours.  Imaging Dg Chest Portable 1 View  Result Date: 08/11/2017 CLINICAL DATA:  Acute shortness of breath. History of end-stage renal disease and CHF. EXAM: PORTABLE CHEST 1 VIEW COMPARISON:  06/15/2017 and prior radiographs FINDINGS: Cardiomegaly and right central venous catheter with tip overlying the mid-lower SVC  again noted. Moderate bilateral interstitial and airspace opacities are compatible with edema. Trace bilateral pleural effusions are noted. There is no evidence of pneumothorax or acute bony abnormality. IMPRESSION: Cardiomegaly with moderate pulmonary edema and trace bilateral pleural effusions. Electronically Signed   By: Margarette Canada M.D.   On: 08/11/2017 15:51    SIGNIFICANT EVENTS: 08/12/17  LINES/TUBES: PIVs Right chest HD catheter  DISCUSSION: 61 year old African-American female presenting with volume overload and pulmonary edema from nonadherence with fluid intake, hypertensive crisis and acute hypoxemic respiratory failure secondary to pulmonary edema  ASSESSMENT  Acute hypoxemic respiratory failure. Acute CHF exacerbation Acute pulmonary edema. Hypertensive crisis History of type 2 diabetes, end-stage renal disease on hemodialysis, hypertension and hyperlipidemia  PLAN Continuous BiPAP as needed and titrate off as tolerated Emergent hemodialysis-performed; 2.5 L out Nephrology consulted and following Nitroglycerin infusion for hypertensive crisis and titrate off as tolerated Resume all home  Medications Monitor and correct all electrolyte imbalances GI and DVT prophylaxis   FAMILY  - Updates: Patient updated on current treatment plan  - Inter-disciplinary family meet or Palliative Care meeting due by:  day [place]. [facility] ANP-BC Pulmonary and Critical Care Medicine [facility] Pager [phone] or [phone]  08/11/2017, 8:57 PM  Merton Border, MD PCCM service Mobile [phone] Pager [phone] 08/12/2017 12:09 PM

## 2017-08-11 NOTE — ED Notes (Signed)
Spoke with Dr. Anselm Jungling re: blood pressure goal for NTG drip.  Informed him the EDP's goal was to be a systolic <482.  Dr. Marthann Schiller stated he would put in an order for goal for NTG for the unit.

## 2017-08-11 NOTE — ED Notes (Signed)
Nephrology at bedside

## 2017-08-11 NOTE — ED Notes (Signed)
Hospitalist in room.

## 2017-08-11 NOTE — ED Triage Notes (Addendum)
Pt arrived via EMS from home with reports of shortness of breath, pt called neighbor due to shortness of breath. Pt has hx of COPD, CHF. Pt RA sats were in the 70s on EMS arrival, Pt placed on CPAP. Pt has edema to face and mouth. Pt is alert on arrival.  Pt is on  HD and had last treatment on Friday.

## 2017-08-11 NOTE — Progress Notes (Signed)
[place], [place] 08/11/17  Subjective:   Patient known to our practice from outpatient dialysis. She presents for acute onset of shortness of breath. She was brought in by EMS. Blood pressure in the ER noted to be 247/150. Patient acutely short of breath therefore placed on BiPAP. She states that she has not missed any dialysis treatment. Her last treatment was Friday. She also states that she has not missed any of her medications. She is currently getting admitted to ICU for flash pulmonary edema. Currently being managed with intravenous nitroglycerin continuous infusion. Blood pressure has improved to 162/99  She denies any nausea, vomiting, diarrhea, fever, chills, cough or sputum production  Objective:  Vital signs in last 24 hours:  Temp:  [97.4 F (36.3 C)] 97.4 F (36.3 C) (09/16 1553) Pulse Rate:  [93-112] 93 (09/16 1600) Resp:  [18-40] 18 (09/16 1600) BP: (162-247)/(98-150) 162/99 (09/16 1600) SpO2:  [92 %-100 %] 93 % (09/16 1600) Weight:  [59 kg (130 lb)] 59 kg (130 lb) (09/16 1543)  Weight change:  Filed Weights   08/11/17 1543  Weight: 59 kg (130 lb)    Intake/Output:   No intake or output data in the 24 hours ending 08/11/17 1614   Physical Exam: General: Ill-appearing, lying in the bed   HEENT NIPPV Mask in place   Neck + JVD and distended neck veins   Pulm/lungs Diffuse pulmonary crackles bilaterally   CVS/Heart Regular, tachycardic   Abdomen:  Soft, nontender   Extremities: 1+ right ankle edema   Neurologic: Alert, able to follow commands   Skin: No acute rashes   Access: Right IJ PermCath, left upper extremity aneurysmal AV fistula        Basic Metabolic Panel:  No results for input(s): NA, K, CL, CO2, GLUCOSE, BUN, CREATININE, CALCIUM, MG, PHOS in the last 168 hours.   CBC:  Recent Labs Lab 08/11/17 1540  WBC 8.2  NEUTROABS 4.7  HGB 11.6*  HCT 35.2  MCV 102.6*  PLT 360     Lab Results  Component Value  Date   HEPBSAG Negative 06/19/2017   HEPBSAB Reactive 05/28/2016      Microbiology:  No results found for this or any previous visit (from the past 240 hour(s)).  Coagulation Studies: No results for input(s): LABPROT, INR in the last 72 hours.  Urinalysis: No results for input(s): COLORURINE, LABSPEC, PHURINE, GLUCOSEU, HGBUR, BILIRUBINUR, KETONESUR, PROTEINUR, UROBILINOGEN, NITRITE, LEUKOCYTESUR in the last 72 hours.  Invalid input(s): APPERANCEUR    Imaging: Dg Chest Portable 1 View  Result Date: 08/11/2017 CLINICAL DATA:  Acute shortness of breath. History of end-stage renal disease and CHF. EXAM: PORTABLE CHEST 1 VIEW COMPARISON:  06/15/2017 and prior radiographs FINDINGS: Cardiomegaly and right central venous catheter with tip overlying the mid-lower SVC again noted. Moderate bilateral interstitial and airspace opacities are compatible with edema. Trace bilateral pleural effusions are noted. There is no evidence of pneumothorax or acute bony abnormality. IMPRESSION: Cardiomegaly with moderate pulmonary edema and trace bilateral pleural effusions. Electronically Signed   By: Margarette Canada M.D.   On: 08/11/2017 15:51     Medications:     nitroGLYCERIN  Assessment/ Plan:  61 y.o. African-American female with hypertension, hyperlipidemia, diastolic heart failure, hypertension, COPD, tobacco abuse, anemia, SHPTH, cocaine abuse  [place]. MWF. 3 hrs. 58.5 kg  1. End-stage renal disease 2. Acute pulmonary edema 3. Accelerated/Malignant hypertension 4. Anemia of chronic kidney disease 5. Secondary hyperparathyroidism  Patient presents with accelerated  hypertension and flash pulmonary edema. Currently being managed with noninvasive positive pressure ventilation and IV nitroglycerin infusion. She was given iv lasix 40 mg x 1. She is being admitted to ICU. Her outpatient estimated dry weight is 58.5 kg.  I have contacted the dialysis nurse. We will get her dialyzed  urgently. May resume calcium acetate for phos binding once she is able to eat a normal diet We will hold EPO administration because of accelerated hypertension Urine tox screen Further plan as hospital course progresses    LOS: 0 [facility] 9/16/20184:[id number] [facility] [place], [place]

## 2017-08-11 NOTE — ED Notes (Addendum)
Husband at bedside, pt states she is feeling much better, NITRO dripped decreased to 164mcg/min at this time. Per Dr. Alfred Levins. Nitro drip should be titrated to make BP a close to 376 systolic as possible.

## 2017-08-11 NOTE — ED Provider Notes (Signed)
[facility] Emergency Department Provider Note  ____________________________________________  Time seen: Approximately 3:43 PM  I have reviewed the triage vital signs and the nursing notes.   HISTORY  Chief Complaint Respiratory Distress  Level 5 caveat:  Portions of the history and physical were unable to be obtained due to sever respiratory distress   HPI Michele Nash is a 61 y.o. female with a history of ESRD on HD (MWF), COPD, hypertension, hyperlipidemia, CHF, diabetes who presents for evaluation of shortness of breath. EMS was called for severe shortness of breath that started this morning. Patient has not missed dialysis. She denies chest pain, fever, cough, congestion. She endorses compliance with her medications. Patient was found hypoxic to the low 70s per EMS and started on CPAP and transported to the emergency department. Patient arrives in severe respiratory distress.  Past Medical History:  Diagnosis Date  . Altered mental status   . Anemia   . Apnea, sleep    for sleep study 10/17/15-no cpap yet  . Arthritis   . Bronchitis   . Chronic kidney disease   . COPD (chronic obstructive pulmonary disease) ([place])   . Depression   . Dialysis patient ([place]) 2014  . GERD (gastroesophageal reflux disease)   . GIB (gastrointestinal bleeding) 07/09/2016  . Headache   . Hyperlipidemia   . Hypertension   . Obesity   . Pulmonary edema 09/14/2016  . Renal dialysis device, implant, or graft complication    RIGHT CHEST CATH  . Respiratory failure ([place]) 07/23/2016  . Traumatic hematoma of forehead   . Type II diabetes mellitus ([place]) 09/21/2009   diet controlled    Patient Active Problem List   Diagnosis Date Noted  . UTI (urinary tract infection) 06/17/2017  . Dizziness 06/15/2017  . Chronic diastolic CHF (congestive heart failure) ([place]) 06/15/2017  . Hyperkalemia 05/21/2017  . Respiratory failure, acute and chronic ([place]) 12/04/2016  . Multiple  closed fractures of ribs of right side   . Hyperlipidemia 11/28/2016  . COPD exacerbation ([place]) 10/29/2016  . Altered mental status   . Fall   . Traumatic hematoma of forehead   . Acute respiratory failure with hypercapnia ([place]) 10/06/2016  . Pulmonary edema 09/14/2016  . Respiratory failure ([place]) 07/23/2016  . GIB (gastrointestinal bleeding) 07/09/2016  . Protein-calorie malnutrition, severe 05/29/2016  . Hyperglycemia 05/28/2016  . Pain in limb 10/28/2015  . ESRD on dialysis ([place]) 10/28/2015  . Essential hypertension 10/28/2015  . ESRD on hemodialysis ([place]) 03/29/2015  . HTN (hypertension) 03/29/2015  . COPD (chronic obstructive pulmonary disease) ([place]) 03/29/2015  . GAD (generalized anxiety disorder) 03/29/2015  . Chronic kidney disease 10/18/2009  . Personal history of tobacco use, presenting hazards to health 10/18/2009  . Type II diabetes mellitus ([place]) 09/21/2009  . Disorder of uterus 02/14/1990    Past Surgical History:  Procedure Laterality Date  . ABDOMINAL HYSTERECTOMY    . COLONOSCOPY  2011  . COLONOSCOPY WITH PROPOFOL N/A 09/20/2015   Procedure: COLONOSCOPY WITH PROPOFOL;  Surgeon: Lucilla Lame, MD;  Location: [facility] ENDOSCOPY;  Service: Endoscopy;  Laterality: N/A;  . DIALYSIS FISTULA CREATION    . ESOPHAGOGASTRODUODENOSCOPY N/A 07/13/2016   Procedure: ESOPHAGOGASTRODUODENOSCOPY (EGD);  Surgeon: Lollie Sails, MD;  Location: [facility] ENDOSCOPY;  Service: Endoscopy;  Laterality: N/A;  . ESOPHAGOGASTRODUODENOSCOPY (EGD) WITH PROPOFOL N/A 06/02/2016   Procedure: ESOPHAGOGASTRODUODENOSCOPY (EGD) WITH PROPOFOL;  Surgeon: Manya Silvas, MD;  Location: [facility] ENDOSCOPY;  Service: Endoscopy;  Laterality: N/A;  . LIGATION OF ARTERIOVENOUS  FISTULA Left 05/24/2017   Procedure: LIGATION OF ARTERIOVENOUS  FISTULA ( PERMCATH INSERTION );  Surgeon: Katha Cabal, MD;  Location: [facility] ORS;  Service: Vascular;  Laterality: Left;  . PERIPHERAL VASCULAR CATHETERIZATION N/A 05/10/2015     Procedure: A/V Shuntogram/Fistulagram;  Surgeon: Katha Cabal, MD;  Location: [place] CV LAB;  Service: Cardiovascular;  Laterality: N/A;  . PERIPHERAL VASCULAR CATHETERIZATION Left 05/10/2015   Procedure: A/V Shunt Intervention;  Surgeon: Katha Cabal, MD;  Location: [place] CV LAB;  Service: Cardiovascular;  Laterality: Left;  . PERIPHERAL VASCULAR CATHETERIZATION Left 08/23/2015   Procedure: A/V Shuntogram/Fistulagram;  Surgeon: Katha Cabal, MD;  Location: [place] CV LAB;  Service: Cardiovascular;  Laterality: Left;  . PERIPHERAL VASCULAR CATHETERIZATION N/A 08/23/2015   Procedure: A/V Shunt Intervention;  Surgeon: Katha Cabal, MD;  Location: [place] CV LAB;  Service: Cardiovascular;  Laterality: N/A;  . PERIPHERAL VASCULAR CATHETERIZATION  08/23/2015   Procedure: Dialysis/Perma Catheter Insertion;  Surgeon: Katha Cabal, MD;  Location: [place] CV LAB;  Service: Cardiovascular;;  . PERIPHERAL VASCULAR CATHETERIZATION N/A 01/03/2016   Procedure: Dialysis/Perma Catheter Removal;  Surgeon: Katha Cabal, MD;  Location: [place] CV LAB;  Service: Cardiovascular;  Laterality: N/A;  . REVISON OF ARTERIOVENOUS FISTULA Left 10/28/2015   Procedure: REVISON OF ARTERIOVENOUS FISTULA WITH ARTEGRAFT;  Surgeon: Katha Cabal, MD;  Location: [facility] ORS;  Service: Vascular;  Laterality: Left;  . WOUND DEBRIDEMENT Left 10/28/2015   Procedure: Resection of shoulder cyst ( left );  Surgeon: Katha Cabal, MD;  Location: [facility] ORS;  Service: Vascular;  Laterality: Left;    Prior to Admission medications   Medication Sig Start Date End Date Taking? Authorizing Provider  albuterol (PROVENTIL HFA;VENTOLIN HFA) 108 (90 Base) MCG/ACT inhaler Inhale 2 puffs into the lungs every 6 (six) hours as needed for wheezing or shortness of breath. 12/11/16   Gouru, Illene Silver, MD  ALPRAZolam Duanne Moron) 0.5 MG tablet Take 0.5 mg by mouth 2 (two) times daily.    [provider]  Amino Acids-Protein Hydrolys (FEEDING SUPPLEMENT, PRO-STAT SUGAR FREE 64,) LIQD Take 30 mLs by mouth daily. 12/12/16   Gouru, Illene Silver, MD  amLODipine (NORVASC) 10 MG tablet Take 10 mg by mouth daily.     [provider]  amoxicillin-clavulanate (AUGMENTIN) 500-125 MG tablet Take 1 tablet (500 mg total) by mouth at bedtime. Patient not taking: Reported on 08/04/2017 06/20/17   Loletha Grayer, MD  calcium acetate (PHOSLO) 667 MG capsule Take 3 capsules (2,001 mg total) by mouth 3 (three) times daily with meals. 06/20/17   Loletha Grayer, MD  carvedilol (COREG) 12.5 MG tablet Take 12.5 mg by mouth 2 (two) times daily with a meal.    [provider]  ezetimibe (ZETIA) 10 MG tablet Take 10 mg by mouth daily.     [provider]  feeding supplement, ENSURE ENLIVE, (ENSURE ENLIVE) LIQD Take 237 mLs by mouth 3 (three) times daily between meals. 10/11/16   [name], MD  fluticasone (FLONASE) 50 MCG/ACT nasal spray Place 2 sprays into both nostrils daily as needed for rhinitis.    [provider]  furosemide (LASIX) 40 MG tablet Take 40 mg by mouth every Tuesday, Thursday, Saturday, and Sunday.     [provider]  hydrALAZINE (APRESOLINE) 50 MG tablet Take 1 tablet (50 mg total) by mouth 3 (three) times daily. 06/20/17   Loletha Grayer, MD  ipratropium-albuterol (DUONEB) 0.5-2.5 (3) MG/3ML SOLN Take 3 mLs  by nebulization every 6 (six) hours as needed. 12/11/16   Nicholes Mango, MD  lidocaine-prilocaine (EMLA) cream Apply 1 application topically as needed (prior to accessing port).    [provider]  losartan (COZAAR) 100 MG tablet Take 100 mg by mouth daily.     [provider]  multivitamin (RENA-VIT) TABS tablet Take 1 tablet by mouth at bedtime.     [provider]  ondansetron (ZOFRAN) 4 MG tablet Take 1 tablet (4 mg total) by mouth every 8 (eight) hours as needed for nausea or vomiting. 03/24/17   Lisa Roca, MD  pantoprazole (PROTONIX) 40 MG tablet Take 40 mg by mouth 2 (two) times daily.    [provider]  sertraline (ZOLOFT) 50 MG tablet Take 50 mg by mouth daily.     [provider]  traMADol (ULTRAM) 50 MG tablet Take 1 tablet (50 mg total) by mouth every 12 (twelve) hours as needed for severe pain. 08/04/17 08/04/18  Arta Silence, MD    Allergies Chantix [varenicline] and Sulfa antibiotics  Family History  Problem Relation Age of Onset  . Heart disease Mother   . Cancer Father   . Cancer Sister   . Breast cancer Sister     Social History Social History  Substance Use Topics  . Smoking status: Light Tobacco Smoker    Years: 40.00    Types: Cigarettes  . Smokeless tobacco: Never Used  . Alcohol use No    Review of Systems  Constitutional: Negative for fever. Eyes: Negative for visual changes. ENT: Negative for sore throat. Neck: No neck pain  Cardiovascular: Negative for chest pain. Respiratory: + shortness of breath. Gastrointestinal: Negative for abdominal pain, vomiting or diarrhea. Genitourinary: Negative for dysuria. Musculoskeletal: Negative for back pain. Skin: Negative for rash. Neurological: Negative for headaches, weakness or numbness.  ____________________________________________   PHYSICAL EXAM:  VITAL SIGNS: ED Triage Vitals  Enc Vitals Group     BP 08/11/17 1530 (!) 247/150     Pulse Rate 08/11/17 1530 99     Resp 08/11/17 1530 (!) 36     Temp --      Temp src --      SpO2 08/11/17 1530 99 %     Weight 08/11/17 1543 130 lb (59 kg)     Height 08/11/17 1543 5\' 5"  (1.651 m)     Head Circumference --      Peak Flow --      Pain Score --      Pain Loc --      Pain Edu? --      Excl. in [place]? --     Constitutional: Severe respiratory distress, awake and alert HEENT:      Head: Normocephalic and atraumatic.         Eyes: Conjunctivae are normal. Sclera is non-icteric.       Mouth/Throat: Mucous membranes are dry.         Neck: Supple with no signs of meningismus. Cardiovascular: Regular rate and rhythm. No murmurs, gallops, or rubs. 2+ symmetrical distal pulses are present in all extremities. No JVD. Respiratory: Severe respiratory distress, hypoxic on room air, decreased air movement, coarse rhonchi and expiratory wheezes bilaterally  Gastrointestinal: Soft, non tender, and non distended with positive bowel sounds. No rebound or guarding. Musculoskeletal: Trace edema on bilateral lower extremities Neurologic: Normal speech and language. Face is symmetric. Moving all extremities. No gross focal neurologic deficits are appreciated. Skin: Skin is warm, dry and intact.  No rash noted.  ____________________________________________   LABS (all labs ordered are listed, but only abnormal results are displayed)  Labs Reviewed  CBC WITH DIFFERENTIAL/PLATELET - Abnormal; Notable for the following:       Result Value   RBC 3.43 (*)    Hemoglobin 11.6 (*)    MCV 102.6 (*)    RDW 14.8 (*)    All other components within normal limits  BLOOD GAS, VENOUS - Abnormal; Notable for the following:    pH, Ven 7.12 (*)    pCO2, Ven 83 (*)    Acid-base deficit 3.9 (*)    All other components within normal limits  BASIC METABOLIC PANEL  TROPONIN I   ____________________________________________  EKG  ED ECG REPORT I, Rudene Re, the attending physician, personally viewed and interpreted this ECG.  Sinus tachycardia, rate of 109, normal intervals, right axis deviation, no ST elevations or depressions.  ____________________________________________  RADIOLOGY  CXR: Cardiomegaly with moderate pulmonary edema and trace bilateral pleural effusions. ____________________________________________   PROCEDURES  Procedure(s) performed: None Procedures Critical Care performed: yes  CRITICAL CARE Performed by: Rudene Re  ?  Total critical care time: 40 min  Critical care time was exclusive of  separately billable procedures and treating other patients.  Critical care was necessary to treat or prevent imminent or life-threatening deterioration.  Critical care was time spent personally by me on the following activities: development of treatment plan with patient and/or surrogate as well as nursing, discussions with consultants, evaluation of patient's response to treatment, examination of patient, obtaining history from patient or surrogate, ordering and performing treatments and interventions, ordering and review of laboratory studies, ordering and review of radiographic studies, pulse oximetry and re-evaluation of patient's condition.  ____________________________________________   INITIAL IMPRESSION / ASSESSMENT AND PLAN / ED COURSE   61 y.o. female with a history of ESRD on HD (MWF), COPD, hypertension, hyperlipidemia, CHF, diabetes who presents for evaluation of shortness of breath. Patient arrives in severe respiratory distress, hypoxic, hypertensive and tachycardic, coarse rhonchi, wheezing, decreased air movement bilaterally. Patient was given sublingual nitroglycerin and started on nitro drip, she was started on BiPAP, she was given 40 mg of Lasix, 3 duo nebs, and 125 mg of Solu-Medrol. ABG showing a pH of 7.12 and a PCO2 of 83. Chest x-ray concerning for pulmonary edema. Nephrology was consult and for emergent dialysis and has evaluated patient in the ER. EKG with no ischemic changes. Patient looks markedly improved after 30 minutes on nitro drip and BiPAP. She is going to be admitted to the ICU for further management.     Pertinent labs & imaging results that were available during my care of the patient were reviewed by me and considered in my medical decision making (see chart for details).    ____________________________________________   FINAL CLINICAL IMPRESSION(S) / ED DIAGNOSES  Final diagnoses:  Acute respiratory failure with hypoxia and hypercapnia (HCC)  Flash  pulmonary edema (HCC)      NEW MEDICATIONS STARTED DURING THIS VISIT:  New Prescriptions   No medications on file     Note:  This document was prepared using Dragon voice recognition software and may include unintentional dictation errors.    Rudene Re, MD 08/11/17 [phone]

## 2017-08-11 NOTE — Progress Notes (Signed)
Pre dialysis [id number]

## 2017-08-11 NOTE — H&P (Signed)
[facility] NAME: Michele Nash    MR#:  [id number]  DATE OF BIRTH:  June 20, 1956  DATE OF ADMISSION:  08/11/2017  PRIMARY CARE PHYSICIAN: [name], MD   REQUESTING/REFERRING PHYSICIAN: Alfred Levins  CHIEF COMPLAINT:   Chief Complaint  Patient presents with  . Respiratory Distress    HISTORY OF PRESENT ILLNESS: Michele Nash  is a 61 y.o. female with a known history of CHF, ESRD on HD< Htn, HLD, DM- last HD was on Friday, compliant with meds- sudden onset SOB today afternoon, noted to have Systolic BP 409, and pulm edema on Xray. Started on nitro drip, and Bipap, called Nephro- plan for urgent HD tonight. No chest pain. Claims to be complaint with low salt diet.  PAST MEDICAL HISTORY:   Past Medical History:  Diagnosis Date  . Altered mental status   . Anemia   . Apnea, sleep    for sleep study 10/17/15-no cpap yet  . Arthritis   . Bronchitis   . CHF (congestive heart failure) ([place])   . Chronic kidney disease   . COPD (chronic obstructive pulmonary disease) ([place])   . Depression   . Dialysis patient ([place]) 2014  . GERD (gastroesophageal reflux disease)   . GIB (gastrointestinal bleeding) 07/09/2016  . Headache   . Hyperlipidemia   . Hypertension   . Obesity   . Pulmonary edema 09/14/2016  . Renal dialysis device, implant, or graft complication    RIGHT CHEST CATH  . Respiratory failure ([place]) 07/23/2016  . Traumatic hematoma of forehead   . Type II diabetes mellitus ([place]) 09/21/2009   diet controlled    PAST SURGICAL HISTORY: Past Surgical History:  Procedure Laterality Date  . ABDOMINAL HYSTERECTOMY    . COLONOSCOPY  2011  . COLONOSCOPY WITH PROPOFOL N/A 09/20/2015   Procedure: COLONOSCOPY WITH PROPOFOL;  Surgeon: Lucilla Lame, MD;  Location: [facility] ENDOSCOPY;  Service: Endoscopy;  Laterality: N/A;  . DIALYSIS FISTULA CREATION    . ESOPHAGOGASTRODUODENOSCOPY N/A 07/13/2016   Procedure: ESOPHAGOGASTRODUODENOSCOPY (EGD);   Surgeon: Lollie Sails, MD;  Location: [facility] ENDOSCOPY;  Service: Endoscopy;  Laterality: N/A;  . ESOPHAGOGASTRODUODENOSCOPY (EGD) WITH PROPOFOL N/A 06/02/2016   Procedure: ESOPHAGOGASTRODUODENOSCOPY (EGD) WITH PROPOFOL;  Surgeon: Manya Silvas, MD;  Location: [facility] ENDOSCOPY;  Service: Endoscopy;  Laterality: N/A;  . LIGATION OF ARTERIOVENOUS  FISTULA Left 05/24/2017   Procedure: LIGATION OF ARTERIOVENOUS  FISTULA ( PERMCATH INSERTION );  Surgeon: Katha Cabal, MD;  Location: [facility] ORS;  Service: Vascular;  Laterality: Left;  . PERIPHERAL VASCULAR CATHETERIZATION N/A 05/10/2015   Procedure: A/V Shuntogram/Fistulagram;  Surgeon: Katha Cabal, MD;  Location: [place] CV LAB;  Service: Cardiovascular;  Laterality: N/A;  . PERIPHERAL VASCULAR CATHETERIZATION Left 05/10/2015   Procedure: A/V Shunt Intervention;  Surgeon: Katha Cabal, MD;  Location: [place] CV LAB;  Service: Cardiovascular;  Laterality: Left;  . PERIPHERAL VASCULAR CATHETERIZATION Left 08/23/2015   Procedure: A/V Shuntogram/Fistulagram;  Surgeon: Katha Cabal, MD;  Location: [place] CV LAB;  Service: Cardiovascular;  Laterality: Left;  . PERIPHERAL VASCULAR CATHETERIZATION N/A 08/23/2015   Procedure: A/V Shunt Intervention;  Surgeon: Katha Cabal, MD;  Location: [place] CV LAB;  Service: Cardiovascular;  Laterality: N/A;  . PERIPHERAL VASCULAR CATHETERIZATION  08/23/2015   Procedure: Dialysis/Perma Catheter Insertion;  Surgeon: Katha Cabal, MD;  Location: [place] CV LAB;  Service: Cardiovascular;;  . PERIPHERAL VASCULAR CATHETERIZATION N/A 01/03/2016   Procedure: Dialysis/Perma Catheter  Removal;  Surgeon: Katha Cabal, MD;  Location: [place] CV LAB;  Service: Cardiovascular;  Laterality: N/A;  . REVISON OF ARTERIOVENOUS FISTULA Left 10/28/2015   Procedure: REVISON OF ARTERIOVENOUS FISTULA WITH ARTEGRAFT;  Surgeon: Katha Cabal, MD;  Location: [facility] ORS;  Service:  Vascular;  Laterality: Left;  . WOUND DEBRIDEMENT Left 10/28/2015   Procedure: Resection of shoulder cyst ( left );  Surgeon: Katha Cabal, MD;  Location: [facility] ORS;  Service: Vascular;  Laterality: Left;    SOCIAL HISTORY:  Social History  Substance Use Topics  . Smoking status: Light Tobacco Smoker    Years: 40.00    Types: Cigarettes  . Smokeless tobacco: Never Used  . Alcohol use No    FAMILY HISTORY:  Family History  Problem Relation Age of Onset  . Heart disease Mother   . Cancer Father   . Cancer Sister   . Breast cancer Sister     DRUG ALLERGIES:  Allergies  Allergen Reactions  . Sulfa Antibiotics Itching, Swelling, Rash and Other (See Comments)    Reaction:  Facial/body swelling     REVIEW OF SYSTEMS:   CONSTITUTIONAL: No fever, fatigue or weakness.  EYES: No blurred or double vision.  EARS, NOSE, AND THROAT: No tinnitus or ear pain.  RESPIRATORY: No cough,positive for shortness of breath, wheezing or hemoptysis.  CARDIOVASCULAR: No chest pain, orthopnea, have edema.  GASTROINTESTINAL: No nausea, vomiting, diarrhea or abdominal pain.  GENITOURINARY: No dysuria, hematuria.  ENDOCRINE: No polyuria, nocturia,  HEMATOLOGY: No anemia, easy bruising or bleeding SKIN: No rash or lesion. MUSCULOSKELETAL: No joint pain or arthritis.   NEUROLOGIC: No tingling, numbness, weakness.  PSYCHIATRY: No anxiety or depression.   MEDICATIONS AT HOME:  Prior to Admission medications   Medication Sig Start Date End Date Taking? Authorizing Provider  albuterol (PROVENTIL HFA;VENTOLIN HFA) 108 (90 Base) MCG/ACT inhaler Inhale 2 puffs into the lungs every 6 (six) hours as needed for wheezing or shortness of breath. 12/11/16  Yes Gouru, Illene Silver, MD  amLODipine (NORVASC) 10 MG tablet Take 10 mg by mouth daily.    Yes [provider]  calcium acetate (PHOSLO) 667 MG capsule Take 3 capsules (2,001 mg total) by mouth 3 (three) times daily with meals. 06/20/17  Yes Wieting,  Richard, MD  calcium carbonate (TUMS - DOSED IN MG ELEMENTAL CALCIUM) 500 MG chewable tablet Chew 1 tablet by mouth daily.   Yes [provider]  carvedilol (COREG) 12.5 MG tablet Take 12.5 mg by mouth 2 (two) times daily with a meal.   Yes [provider]  ezetimibe (ZETIA) 10 MG tablet Take 10 mg by mouth daily.    Yes [provider]  fluticasone (FLONASE) 50 MCG/ACT nasal spray Place 2 sprays into both nostrils daily as needed for rhinitis.   Yes [provider]  ipratropium-albuterol (DUONEB) 0.5-2.5 (3) MG/3ML SOLN Take 3 mLs by nebulization every 6 (six) hours as needed. 12/11/16  Yes Gouru, Illene Silver, MD  lidocaine-prilocaine (EMLA) cream Apply 1 application topically as needed (prior to accessing port).   Yes [provider]  losartan (COZAAR) 100 MG tablet Take 100 mg by mouth daily.    Yes [provider]  multivitamin (RENA-VIT) TABS tablet Take 1 tablet by mouth at bedtime.    Yes [provider]  ondansetron (ZOFRAN) 4 MG tablet Take 1 tablet (4 mg total) by mouth every 8 (eight) hours as needed for nausea or vomiting. 03/24/17  Yes Lisa Roca, MD  pantoprazole (  PROTONIX) 40 MG tablet Take 40 mg by mouth 2 (two) times daily.   Yes [provider]  sertraline (ZOLOFT) 50 MG tablet Take 50 mg by mouth daily.    Yes [provider]  traMADol (ULTRAM) 50 MG tablet Take 1 tablet (50 mg total) by mouth every 12 (twelve) hours as needed for severe pain. 08/04/17 08/04/18 Yes Arta Silence, MD  ALPRAZolam Duanne Moron) 0.5 MG tablet Take 0.5 mg by mouth 2 (two) times daily.    [provider]  Amino Acids-Protein Hydrolys (FEEDING SUPPLEMENT, PRO-STAT SUGAR FREE 64,) LIQD Take 30 mLs by mouth daily. 12/12/16   Nicholes Mango, MD  amoxicillin-clavulanate (AUGMENTIN) 500-125 MG tablet Take 1 tablet (500 mg total) by mouth at bedtime. Patient not taking: Reported on 08/04/2017 06/20/17   Loletha Grayer, MD  feeding  supplement, ENSURE ENLIVE, (ENSURE ENLIVE) LIQD Take 237 mLs by mouth 3 (three) times daily between meals. 10/11/16   [name], MD  furosemide (LASIX) 40 MG tablet Take 40 mg by mouth every Tuesday, Thursday, Saturday, and Sunday.     [provider]  hydrALAZINE (APRESOLINE) 50 MG tablet Take 1 tablet (50 mg total) by mouth 3 (three) times daily. Patient not taking: Reported on 08/11/2017 06/20/17   Loletha Grayer, MD      PHYSICAL EXAMINATION:   VITAL SIGNS: Blood pressure (!) 152/92, pulse 81, temperature (!) 97.4 F (36.3 C), temperature source Axillary, resp. rate (!) 22, height 5\' 5"  (1.651 m), weight 59 kg (130 lb), SpO2 98 %.  GENERAL:  61 y.o.-year-old patient lying in the bed with no acute distress.  EYES: Pupils equal, round, reactive to light and accommodation. No scleral icterus. Extraocular muscles intact. Some eyelid puffiness. HEENT: Head atraumatic, normocephalic. Oropharynx and nasopharynx clear.  NECK:  Supple, no jugular venous distention. No thyroid enlargement, no tenderness.  LUNGS: Normal breath sounds bilaterally, no wheezing, b/l crepitation. No use of accessory muscles of respiration. On bipap use. CARDIOVASCULAR: S1, S2 normal. No murmurs, rubs, or gallops.  ABDOMEN: Soft, nontender, nondistended. Bowel sounds present. No organomegaly or mass.  EXTREMITIES: No pedal edema, cyanosis, or clubbing.  NEUROLOGIC: Cranial nerves II through XII are intact. Muscle strength 5/5 in all extremities. Sensation intact. Gait not checked.  PSYCHIATRIC: The patient is alert and oriented x 3.  SKIN: No obvious rash, lesion, or ulcer.   LABORATORY PANEL:   CBC  Recent Labs Lab 08/11/17 1540  WBC 8.2  HGB 11.6*  HCT 35.2  PLT 360  MCV 102.6*  MCH 33.7  MCHC 32.9  RDW 14.8*  LYMPHSABS 2.2  MONOABS 0.8  EOSABS 0.4  BASOSABS 0.1    ------------------------------------------------------------------------------------------------------------------  Chemistries   Recent Labs Lab 08/11/17 1540  NA 141  K 5.1  CL 101  CO2 26  GLUCOSE 161*  BUN 67*  CREATININE 8.07*  CALCIUM 8.4*   ------------------------------------------------------------------------------------------------------------------ estimated creatinine clearance is 6.4 mL/min (A) (by C-G formula based on SCr of 8.07 mg/dL (H)). ------------------------------------------------------------------------------------------------------------------ No results for input(s): TSH, T4TOTAL, T3FREE, THYROIDAB in the last 72 hours.  Invalid input(s): FREET3   Coagulation profile No results for input(s): INR, PROTIME in the last 168 hours. ------------------------------------------------------------------------------------------------------------------- No results for input(s): DDIMER in the last 72 hours. -------------------------------------------------------------------------------------------------------------------  Cardiac Enzymes  Recent Labs Lab 08/11/17 1540  TROPONINI 0.06*   ------------------------------------------------------------------------------------------------------------------ Invalid input(s): POCBNP  ---------------------------------------------------------------------------------------------------------------  Urinalysis    Component Value Date/Time   COLORURINE YELLOW (A) 06/16/2017 0032   APPEARANCEUR CLOUDY (A) 06/16/2017 0032   APPEARANCEUR Cloudy  06/04/2013 0324   LABSPEC 1.006 06/16/2017 0032   LABSPEC 1.006 06/04/2013 0324   PHURINE 8.0 06/16/2017 0032   GLUCOSEU 150 (A) 06/16/2017 0032   GLUCOSEU >=500 06/04/2013 0324   HGBUR SMALL (A) 06/16/2017 0032   BILIRUBINUR NEGATIVE 06/16/2017 0032   BILIRUBINUR Negative 06/04/2013 0324   KETONESUR NEGATIVE 06/16/2017 0032   PROTEINUR 30 (A) 06/16/2017 0032   NITRITE  NEGATIVE 06/16/2017 0032   LEUKOCYTESUR LARGE (A) 06/16/2017 0032   LEUKOCYTESUR 3+ 06/04/2013 0324     RADIOLOGY: Dg Chest Portable 1 View  Result Date: 08/11/2017 CLINICAL DATA:  Acute shortness of breath. History of end-stage renal disease and CHF. EXAM: PORTABLE CHEST 1 VIEW COMPARISON:  06/15/2017 and prior radiographs FINDINGS: Cardiomegaly and right central venous catheter with tip overlying the mid-lower SVC again noted. Moderate bilateral interstitial and airspace opacities are compatible with edema. Trace bilateral pleural effusions are noted. There is no evidence of pneumothorax or acute bony abnormality. IMPRESSION: Cardiomegaly with moderate pulmonary edema and trace bilateral pleural effusions. Electronically Signed   By: Margarette Canada M.D.   On: 08/11/2017 15:51    EKG: Orders placed or performed during the hospital encounter of 08/11/17  . ED EKG  . ED EKG  . EKG 12-Lead  . EKG 12-Lead    IMPRESSION AND PLAN:  * Ac respi failure   Flash pulm edema'   Ac diastolic CHF   Hypertensive urgency    On Bipap    Nitro drip   Monitor in ICU   Urgent HD today by nephro.    Resume all home meds for Htn  * COPD   COnt inhalers, no wheezing.  * ESRD on HD   Nephro on case.  All the records are reviewed and case discussed with ED provider. Management plans discussed with the patient, family and they are in agreement.  CODE STATUS: Full. Code Status History    Date Active Date Inactive Code Status Order ID Comments User Context   06/15/2017  7:42 PM 06/20/2017  4:52 PM Full Code [id number]  Idelle Crouch, MD Inpatient   05/21/2017  5:58 PM 05/22/2017  5:24 PM Full Code [id number]  Nicholes Mango, MD Inpatient   12/04/2016  9:55 AM 12/11/2016  5:41 PM Full Code [id number]  Fritzi Mandes, MD Inpatient   10/29/2016 12:14 PM 10/30/2016  7:43 PM Full Code [id number]  Laverle Hobby, MD ED   10/06/2016  8:33 PM 10/11/2016  3:54 PM Full Code [id number]  Mikael Spray, NP ED    09/14/2016  9:19 AM 09/16/2016  4:07 PM Full Code [id number]  Laverle Hobby, MD ED   07/23/2016 11:05 AM 07/24/2016 10:49 PM Full Code [id number]  Wilhelmina Mcardle, MD ED   07/09/2016  6:43 PM 07/13/2016  5:23 PM Full Code [id number]  Hower, Aaron Mose, MD ED   05/28/2016  2:54 PM 06/02/2016  2:51 PM Full Code [id number]  Theodoro Grist, MD Inpatient   04/25/2016  6:04 PM 04/26/2016  9:17 PM Full Code [id number]  Nicholes Mango, MD Inpatient   10/28/2015  6:50 PM 10/30/2015  6:09 PM Full Code [id number]  Theodoro Grist, MD Inpatient   03/30/2015 12:51 AM 03/31/2015  4:44 PM Full Code [id number]  Juluis Mire, MD Inpatient    Advance Directive Documentation     Most Recent Value  Type of Advance Directive  Healthcare Power of Attorney  Pre-existing out of facility DNR order (yellow form or pink MOST form)  -  "MOST" Form in  Place?  -       TOTAL TIME TAKING CARE OF THIS PATIENT: 50 critical care minutes.  Spoke to Pt's husband in room and Dr. Elsworth Soho ( [facility] ICU physician ) on phone.  Vaughan Basta M.D on 08/11/2017   Between 7am to 6pm - Pager - [phone]  After 6pm go to [email or web address] - password [email or web address] [facility]  Office  [phone]  CC: Primary care physician; [name], MD   Note: This dictation was prepared with Dragon dictation along with smaller phrase technology. Any transcriptional errors that result from this process are unintentional.

## 2017-08-11 NOTE — Progress Notes (Signed)
[place] responded to a referral to ED-06. Pt was having difficulty breathing. Husband is bedside. Pt stated that she is breathing better and asked for prayer, which was provided.    08/11/17 1700  Clinical Encounter Type  Visited With Patient;Patient and family together;Health care provider  Visit Type Initial;Spiritual support;ED  Referral From Nurse  Consult/Referral To Chaplain  Spiritual Encounters  Spiritual Needs Prayer

## 2017-08-12 DIAGNOSIS — J9602 Acute respiratory failure with hypercapnia: Secondary | ICD-10-CM

## 2017-08-12 DIAGNOSIS — I160 Hypertensive urgency: Secondary | ICD-10-CM

## 2017-08-12 LAB — CBC
HCT: 29.9 % — ABNORMAL LOW (ref 35.0–47.0)
Hemoglobin: 10 g/dL — ABNORMAL LOW (ref 12.0–16.0)
MCH: 34.1 pg — ABNORMAL HIGH (ref 26.0–34.0)
MCHC: 33.5 g/dL (ref 32.0–36.0)
MCV: 101.9 fL — ABNORMAL HIGH (ref 80.0–100.0)
Platelets: 278 10*3/uL (ref 150–440)
RBC: 2.94 MIL/uL — ABNORMAL LOW (ref 3.80–5.20)
RDW: 15 % — ABNORMAL HIGH (ref 11.5–14.5)
WBC: 8.2 10*3/uL (ref 3.6–11.0)

## 2017-08-12 LAB — BASIC METABOLIC PANEL WITH GFR
Anion gap: 8 (ref 5–15)
BUN: 28 mg/dL — ABNORMAL HIGH (ref 6–20)
CO2: 29 mmol/L (ref 22–32)
Calcium: 8.2 mg/dL — ABNORMAL LOW (ref 8.9–10.3)
Chloride: 99 mmol/L — ABNORMAL LOW (ref 101–111)
Creatinine, Ser: 4 mg/dL — ABNORMAL HIGH (ref 0.44–1.00)
GFR calc Af Amer: 13 mL/min — ABNORMAL LOW
GFR calc non Af Amer: 11 mL/min — ABNORMAL LOW
Glucose, Bld: 143 mg/dL — ABNORMAL HIGH (ref 65–99)
Potassium: 4.4 mmol/L (ref 3.5–5.1)
Sodium: 136 mmol/L (ref 135–145)

## 2017-08-12 LAB — PHOSPHORUS: Phosphorus: 4 mg/dL (ref 2.5–4.6)

## 2017-08-12 LAB — GLUCOSE, CAPILLARY
GLUCOSE-CAPILLARY: 100 mg/dL — AB (ref 65–99)
Glucose-Capillary: 114 mg/dL — ABNORMAL HIGH (ref 65–99)

## 2017-08-12 LAB — MAGNESIUM: MAGNESIUM: 1.7 mg/dL (ref 1.7–2.4)

## 2017-08-12 MED ORDER — ALPRAZOLAM 0.5 MG PO TABS
0.5000 mg | ORAL_TABLET | Freq: Two times a day (BID) | ORAL | Status: DC | PRN
  Filled 2017-08-12: qty 1

## 2017-08-12 MED ORDER — NICOTINE 14 MG/24HR TD PT24
14.0000 mg | MEDICATED_PATCH | Freq: Every day | TRANSDERMAL | Status: DC
  Administered 2017-08-12 – 2017-08-13 (×2): 14 mg via TRANSDERMAL
  Filled 2017-08-12 (×2): qty 1

## 2017-08-12 MED ORDER — NEOMYCIN-POLYMYXIN-HC 3.5-10000-1 OT SUSP
3.0000 [drp] | Freq: Three times a day (TID) | OTIC | Status: DC
  Administered 2017-08-12 – 2017-08-13 (×5): 3 [drp] via OTIC
  Filled 2017-08-12: qty 10

## 2017-08-12 MED ORDER — HEPARIN SODIUM (PORCINE) 1000 UNIT/ML IJ SOLN
3000.0000 [IU] | INTRAMUSCULAR | Status: DC | PRN
  Administered 2017-08-12: 3000 [IU]
  Filled 2017-08-12: qty 3

## 2017-08-12 NOTE — Evaluation (Signed)
Physical Therapy Evaluation Patient Details Name: Michele Nash MRN: [id number] DOB: 1956-07-17 Today's Date: 08/12/2017   History of Present Illness  Pt is a 61 y.o. female presenting to hospital with severe SOB (pt hypoxic in low 70's per EMS and in ED pt's BP 247/150).  Pt placed on BiPAP and transferred to ICU initially for flash pulmonary edema, acute respiratory failure, acute on chronic diastolic CHF, and accelerated htn.  PMH includes ESRD (MWF), COPD, htn, CHF, DM.  Clinical Impression  Prior to hospital admission, pt was modified independent with functional mobility (no use of AD; uses 3-4 L home O2).  Pt plans to discharge to sister's home.  Currently pt is modified independent with bed mobility, independent with transfers, and SBA with ambulation around nursing loop and navigating 5 stairs with railing (SBA required only for O2 tank management).  O2 90% or greater on 4 L supplemental O2 during session's activities and pt appeared to tolerate activities well.  Pt appears to be at baseline level functional mobility; no acute PT needs identified.  Will discharge pt in house and complete current PT order.  Please re-consult PT if pt's status changes and acute PT needs are identified.    Follow Up Recommendations No PT follow up    Equipment Recommendations  None recommended by PT    Recommendations for Other Services       Precautions / Restrictions Precautions Precautions: Fall Precaution Comments: L UE AV fistula; R IJ permcath Restrictions Weight Bearing Restrictions: No      Mobility  Bed Mobility Overal bed mobility: Modified Independent             General bed mobility comments: Supine to/from sit with HOB elevated no difficulties.  Transfers Overall transfer level: Independent Equipment used: None             General transfer comment: steady strong sit to/from stand; no difficulties noted  Ambulation/Gait Ambulation/Gait assistance: Supervision (SBA  for O2 tank) Ambulation Distance (Feet):  (200 feet) Assistive device: None Gait Pattern/deviations: WFL(Within Functional Limits)   Gait velocity interpretation: at or above normal speed for age/gender General Gait Details: steady without loss of balance  Stairs Stairs: Yes Stairs assistance: Supervision (SBA for O2 tank management) Stair Management: One rail Left;Step to pattern;Forwards Number of Stairs: 5 General stair comments: steady; no difficulties noted  Wheelchair Mobility    Modified Rankin (Stroke Patients Only)       Balance Overall balance assessment: No apparent balance deficits (not formally assessed) (No loss of balance with ambulation and head turns R/L/up/down, increasing/decreasing speed, and turning 180 degrees and stopping)                                           Pertinent Vitals/Pain Pain Assessment: No/denies pain  HR WFL during session.    Home Living Family/patient expects to be discharged to:: Private residence Living Arrangements:  (Splits time between home of husband and sister (plans to discharge to sister's home)) Available Help at Discharge: Family;Available PRN/intermittently Type of Home: House Home Access: Stairs to enter Entrance Stairs-Rails: Left Entrance Stairs-Number of Steps: 5 Home Layout: One level Home Equipment: None Additional Comments: Above information is sister's home set-up (pt plans to discharge to sister's home)    Prior Function Level of Independence: Independent         Comments: Uses 3 L home O2  at rest but increases to 4 L with activity.  Pt denies any falls in past 6 months.  Takes sponge baths (d/t R IJ HD cath).     Hand Dominance   Dominant Hand: Right    Extremity/Trunk Assessment   Upper Extremity Assessment Upper Extremity Assessment: Overall WFL for tasks assessed    Lower Extremity Assessment Lower Extremity Assessment: Overall WFL for tasks assessed    Cervical /  Trunk Assessment Cervical / Trunk Assessment: Normal  Communication   Communication: No difficulties  Cognition Arousal/Alertness: Awake/alert Behavior During Therapy: WFL for tasks assessed/performed Overall Cognitive Status: Within Functional Limits for tasks assessed                                 General Comments: Pt sitting up in bed eating upon PT arrival.      General Comments   Nursing cleared pt for participation in physical therapy.  Pt agreeable to PT session.    Exercises     Assessment/Plan    PT Assessment Patent does not need any further PT services  PT Problem List         PT Treatment Interventions      PT Goals (Current goals can be found in the Care Plan section)  Acute Rehab PT Goals Patient Stated Goal: to go home PT Goal Formulation: With patient Time For Goal Achievement: 08/26/17 Potential to Achieve Goals: Good    Frequency     Barriers to discharge        Co-evaluation               AM-PAC PT "6 Clicks" Daily Activity  Outcome Measure Difficulty turning over in bed (including adjusting bedclothes, sheets and blankets)?: None Difficulty moving from lying on back to sitting on the side of the bed? : None Difficulty sitting down on and standing up from a chair with arms (e.g., wheelchair, bedside commode, etc,.)?: None Help needed moving to and from a bed to chair (including a wheelchair)?: None Help needed walking in hospital room?: A Little Help needed climbing 3-5 steps with a railing? : A Little 6 Click Score: 22    End of Session Equipment Utilized During Treatment: Gait belt;Oxygen (4 L O2 via nasal cannula) Activity Tolerance: Patient tolerated treatment well Patient left: in bed;with call bell/phone within reach (pt low fall risk) Nurse Communication: Mobility status;Precautions PT Visit Diagnosis: Muscle weakness (generalized) (M62.81)    Time: 1062-6948 PT Time Calculation (min) (ACUTE ONLY): 19  min   Charges:   PT Evaluation $PT Eval Low Complexity: 1 Low     PT G Codes:   PT G-Codes **NOT FOR INPATIENT CLASS** Functional Assessment Tool Used: AM-PAC 6 Clicks Basic Mobility Functional Limitation: Mobility: Walking and moving around Mobility: Walking and Moving Around Current Status (N4627): At least 20 percent but less than 40 percent impaired, limited or restricted Mobility: Walking and Moving Around Goal Status [phone]): At least 20 percent but less than 40 percent impaired, limited or restricted Mobility: Walking and Moving Around Discharge Status [phone]): At least 20 percent but less than 40 percent impaired, limited or restricted    Leitha Bleak, PT 08/12/17, 2:32 PM [phone]

## 2017-08-12 NOTE — Progress Notes (Signed)
POST DIALYSIS ASSESSMENT 

## 2017-08-12 NOTE — Progress Notes (Signed)
Patient on bipap 10/5 with 4 liter bleed in.  Noted originally on 12/6 however, now patient states that's too much air and she will not be able to wear. So, titrated down so patient could tolerate

## 2017-08-12 NOTE — Progress Notes (Signed)
[facility] NAME: Michele Nash    MR#:  [id number]  DATE OF BIRTH:  Nov 22, 1956  SUBJECTIVE:   Sleepy this am SOB but  Better then yesterday Underwent HD yesterday  REVIEW OF SYSTEMS:    Review of Systems  Constitutional: Negative for fever, chills weight loss HENT: Negative for ear pain, nosebleeds, congestion, facial swelling, rhinorrhea, neck pain, neck stiffness and ear discharge.   Respiratory: Negative for cough, +++ shortness of breath, NOwheezing  Cardiovascular: Negative for chest pain, palpitations and leg swelling.  Gastrointestinal: Negative for heartburn, abdominal pain, vomiting, diarrhea or consitpation Genitourinary: Negative for dysuria, urgency, frequency, hematuria Musculoskeletal: Negative for back pain or joint pain Neurological: Negative for dizziness, seizures, syncope, focal weakness,  numbness and headaches.  Hematological: Does not bruise/bleed easily.  Psychiatric/Behavioral: Negative for hallucinations, confusion, dysphoric mood    Tolerating Diet:yes      DRUG ALLERGIES:   Allergies  Allergen Reactions  . Sulfa Antibiotics Itching, Swelling, Rash and Other (See Comments)    Reaction:  Facial/body swelling     VITALS:  Blood pressure (!) 161/71, pulse 86, temperature 97.7 F (36.5 C), temperature source Axillary, resp. rate (!) 25, height 5\' 5"  (1.651 m), weight 65.4 kg (144 lb 2.9 oz), SpO2 97 %.  PHYSICAL EXAMINATION:  Constitutional: Appears well-developed and well-nourished. No distress. HENT: Normocephalic. Marland Kitchen Oropharynx is clear and moist.  Eyes: Conjunctivae and EOM are normal. PERRLA, no scleral icterus.  Neck: Normal ROM. Neck supple. No JVD. No tracheal deviation. CVS: RRR, S1/S2 +, no murmurs, no gallops, no carotid bruit.  Pulmonary: diminished breath sounds with some faint crackles at the bases Abdominal: Soft. BS +,  no distension, tenderness, rebound or guarding.   Musculoskeletal: Normal range of motion. No edema and no tenderness.  Neuro: Alert. CN 2-12 grossly intact. No focal deficits. Skin: Skin is warm and dry. No rash noted. Psychiatric: Normal mood and affect.      LABORATORY PANEL:   CBC  Recent Labs Lab 08/12/17 0427  WBC 8.2  HGB 10.0*  HCT 29.9*  PLT 278   ------------------------------------------------------------------------------------------------------------------  Chemistries   Recent Labs Lab 08/12/17 0427  NA 136  K 4.4  CL 99*  CO2 29  GLUCOSE 143*  BUN 28*  CREATININE 4.00*  CALCIUM 8.2*  MG 1.7   ------------------------------------------------------------------------------------------------------------------  Cardiac Enzymes  Recent Labs Lab 08/11/17 1540  TROPONINI 0.06*   ------------------------------------------------------------------------------------------------------------------  RADIOLOGY:  Dg Chest Portable 1 View  Result Date: 08/11/2017 CLINICAL DATA:  Acute shortness of breath. History of end-stage renal disease and CHF. EXAM: PORTABLE CHEST 1 VIEW COMPARISON:  06/15/2017 and prior radiographs FINDINGS: Cardiomegaly and right central venous catheter with tip overlying the mid-lower SVC again noted. Moderate bilateral interstitial and airspace opacities are compatible with edema. Trace bilateral pleural effusions are noted. There is no evidence of pneumothorax or acute bony abnormality. IMPRESSION: Cardiomegaly with moderate pulmonary edema and trace bilateral pleural effusions. Electronically Signed   By: Margarette Canada M.D.   On: 08/11/2017 15:51     ASSESSMENT AND PLAN:   61 year old female with history of end-stage renal disease on hemodialysis Monday, Wednesday and Friday and chronic diastolic heart failure who presented with shortness of breath.  1.acute hypoxic respiratory failure in the setting of pulmonary edema due to fluid overload from underlying kidney disease in addition  to acute on chronic diastolic heart failure Wean oxygen as tolerated Patient is now off BiPAP  2. ESRD on  hemodialysis requiring urgent hemodialysis Patient will undergo her routine dialysis Today.  3. Acute on chronic diastolic heart failure with preserved ejection fraction: Patient will continue with dialysis as this would help with fluid removal.  4. Accelerated essential hypertension: Blood pressure is better controlled:  Continue hydralazine, losartan, Norvasc and Coreg  5. Hyperlipidemia: Continue study a  6.depression: Continue Zoloft  7. COPD without signs of exacerbation Continue when necessary DuoNeb's    Management plans discussed with the patient and she is in agreement.  CODE STATUS: full  TOTAL TIME TAKING CARE OF THIS PATIENT: 30 minutes.   Physical therapy consultation for disposition  POSSIBLE D/C tomorrow, DEPENDING ON CLINICAL CONDITION.   Michele Nash M.D on 08/12/2017 at 11:17 AM  Between 7am to 6pm - Pager - [phone] After 6pm go to [email or web address] - password [email or web address] [facility]  Office  [phone]  CC: Primary care physician; [name], MD  Note: This dictation was prepared with Dragon dictation along with smaller phrase technology. Any transcriptional errors that result from this process are unintentional.

## 2017-08-12 NOTE — Progress Notes (Signed)
PULMONARY / CRITICAL CARE MEDICINE   Name: Michele Nash MRN: [id number] DOB: 1956/01/31    ADMISSION DATE:  08/11/2017   CONSULTATION DATE:  08/11/2017  REFERRING MD:  Dr. Anselm Jungling  Reason: Acute pulmonary edema, volume overload, need for emergent hemodialysis  CHIEF COMPLAINT:  Acute dyspnea  HISTORY OF PRESENT ILLNESS:   Mrs. Michele Nash is a 61 year old African-American female with a history of end-stage renal disease and multiple comorbidities as indicated below; well known to our service who presented to the ED with complaints of acute dyspnea. Patient states that she was at a particular day before and did not realize that she had consumed a lot of fluids. This afternoon. Has symptoms got worse and hence she decided to call EMS. Upon EMS arrival, patient was hypoxic with SPO2 in the 70s. At the ED, had chest x-ray showed severe pulmonary edema. She is usually goes for hemodialysis Monday, Wednesday and Fridays and states that she did go to all her SESSIONS this week.  Reports improvement in dyspnea with BiPAP. She is in the ICU for emergent hemodialysis.  SUBJECTIVE: Doing well post HD. Offers no complaints  VITAL SIGNS: BP (!) 166/79   Pulse 71   Temp 98 F (36.7 C) (Oral)   Resp 18   Ht 5\' 5"  (1.651 m)   Wt 144 lb 2.9 oz (65.4 kg)   SpO2 98%   BMI 23.99 kg/m   HEMODYNAMICS:    VENTILATOR SETTINGS:    INTAKE / OUTPUT: I/O last 3 completed shifts: In: 180 [P.O.:180] Out: 2580 [Urine:80; Other:2500]  PHYSICAL EXAMINATION: General: Chronically ill-looking Neuro: Awake, speech is normal, no focal deficits HEENT:  PERRLA, trachea midline Cardiovascular:  RRR, S1, S2, no murmur, regurg or gallop; anasarca Lungs: Moderate increase in work of breathing, bilateral breath sounds with positive rhonchi and crackles in all lung fields Abdomen:  Soft, nontender, nondistended, normal bowel sounds Musculoskeletal:  Positive range of motion in upper and lower extremities, no  deformities Skin: Skin is warm and dry  LABS:  BMET  Recent Labs Lab 08/11/17 1540 08/12/17 0427  NA 141 136  K 5.1 4.4  CL 101 99*  CO2 26 29  BUN 67* 28*  CREATININE 8.07* 4.00*  GLUCOSE 161* 143*    Electrolytes  Recent Labs Lab 08/11/17 1540 08/11/17 1832 08/12/17 0427  CALCIUM 8.4*  --  8.2*  MG  --   --  1.7  PHOS  --  4.6 4.0    CBC  Recent Labs Lab 08/11/17 1540 08/12/17 0427  WBC 8.2 8.2  HGB 11.6* 10.0*  HCT 35.2 29.9*  PLT 360 278    Coag's No results for input(s): APTT, INR in the last 168 hours.  Sepsis Markers No results for input(s): LATICACIDVEN, PROCALCITON, O2SATVEN in the last 168 hours.  ABG No results for input(s): PHART, PCO2ART, PO2ART in the last 168 hours.  Liver Enzymes No results for input(s): AST, ALT, ALKPHOS, BILITOT, ALBUMIN in the last 168 hours.  Cardiac Enzymes  Recent Labs Lab 08/11/17 1540  TROPONINI 0.06*    Glucose No results for input(s): GLUCAP in the last 168 hours.  Imaging Dg Chest Portable 1 View  Result Date: 08/11/2017 CLINICAL DATA:  Acute shortness of breath. History of end-stage renal disease and CHF. EXAM: PORTABLE CHEST 1 VIEW COMPARISON:  06/15/2017 and prior radiographs FINDINGS: Cardiomegaly and right central venous catheter with tip overlying the mid-lower SVC again noted. Moderate bilateral interstitial and airspace opacities are compatible with edema. Trace bilateral  pleural effusions are noted. There is no evidence of pneumothorax or acute bony abnormality. IMPRESSION: Cardiomegaly with moderate pulmonary edema and trace bilateral pleural effusions. Electronically Signed   By: Margarette Canada M.D.   On: 08/11/2017 15:51    SIGNIFICANT EVENTS: 08/12/17  LINES/TUBES: PIVs Right chest HD catheter  DISCUSSION: 61 year old African-American female presenting with volume overload and pulmonary edema from nonadherence with fluid intake, hypertensive crisis and acute hypoxemic respiratory  failure secondary to pulmonary edema  ASSESSMENT  Acute hypoxemic respiratory failure. Acute CHF exacerbation Acute pulmonary edema. Hypertensive crisis History of type 2 diabetes, end-stage renal disease on hemodialysis, hypertension and hyperlipidemia  PLAN Continuous BiPAP as needed and titrate off as tolerated Emergent hemodialysis-performed; 2.5 L out Nephrology consulted and following Nitroglycerin infusion for hypertensive crisis and titrate off as tolerated Resume all home  Medications Monitor and correct all electrolyte imbalances GI and DVT prophylaxis  Patient is stable for floor transfer  FAMILY  - Updates: Patient updated on current treatment plan  - Inter-disciplinary family meet or Palliative Care meeting due by:  day 7   Magdalene S. Navos ANP-BC Pulmonary and Critical Care Medicine [facility] Pager [phone] or [phone]  08/12/2017, 7:23 AM   Merton Border, MD PCCM service Mobile [phone] Pager [phone] 08/12/2017 12:10 PM

## 2017-08-12 NOTE — Care Management (Signed)
RNCM has notified Michele Nash with [facility]/dialysis liaison of patient admission.

## 2017-08-12 NOTE — Progress Notes (Signed)
[facility], [place] 08/12/17  Subjective:  Patient did complete hemodialysis yesterday. Ultrafiltration achieved was 2.5 kg. Patient is due for dialysis today as well per her usual schedule. Feeling better overall. Less short of breath than she was yesterday.   Objective:  Vital signs in last 24 hours:  Temp:  [97.4 F (36.3 C)-98.5 F (36.9 C)] 97.7 F (36.5 C) (09/17 0800) Pulse Rate:  [71-112] 86 (09/17 0900) Resp:  [13-40] 25 (09/17 0900) BP: (132-247)/(71-150) 161/71 (09/17 0900) SpO2:  [89 %-100 %] 97 % (09/17 0900) Weight:  [59 kg (130 lb)-65.4 kg (144 lb 2.9 oz)] 65.4 kg (144 lb 2.9 oz) (09/16 2055)  Weight change:  Filed Weights   08/11/17 1543 08/11/17 1753 08/11/17 2055  Weight: 59 kg (130 lb) 61.6 kg (135 lb 12.9 oz) 65.4 kg (144 lb 2.9 oz)    Intake/Output:    Intake/Output Summary (Last 24 hours) at 08/12/17 0942 Last data filed at 08/12/17 0940  Gross per 24 hour  Intake              420 ml  Output             2655 ml  Net            -2235 ml     Physical Exam: General: No acute distress  HEENT Nasal canula on, hearing intact, OM moist  Neck Mild distension of neck veins  Pulm/lungs Minimal rales at bases  CVS/Heart Regular, no rubs  Abdomen:  Soft, nontender   Extremities: 1+ right ankle edema   Neurologic: Alert, able to follow commands   Skin: No acute rashes   Access: Right IJ PermCath, left upper extremity aneurysmal AV fistula        Basic Metabolic Panel:   Recent Labs Lab 08/11/17 1540 08/11/17 1832 08/12/17 0427  NA 141  --  136  K 5.1  --  4.4  CL 101  --  99*  CO2 26  --  29  GLUCOSE 161*  --  143*  BUN 67*  --  28*  CREATININE 8.07*  --  4.00*  CALCIUM 8.4*  --  8.2*  MG  --   --  1.7  PHOS  --  4.6 4.0     CBC:  Recent Labs Lab 08/11/17 1540 08/12/17 0427  WBC 8.2 8.2  NEUTROABS 4.7  --   HGB 11.6* 10.0*  HCT 35.2 29.9*  MCV 102.6* 101.9*  PLT 360 278      Lab Results   Component Value Date   HEPBSAG Negative 06/19/2017   HEPBSAB Reactive 05/28/2016      Microbiology:  Recent Results (from the past 240 hour(s))  MRSA PCR Screening     Status: None   Collection Time: 08/11/17  5:51 PM  Result Value Ref Range Status   MRSA by PCR NEGATIVE NEGATIVE Final    Comment:        The GeneXpert MRSA Assay (FDA approved for NASAL specimens only), is one component of a comprehensive MRSA colonization surveillance program. It is not intended to diagnose MRSA infection nor to guide or monitor treatment for MRSA infections.     Coagulation Studies: No results for input(s): LABPROT, INR in the last 72 hours.  Urinalysis: No results for input(s): COLORURINE, LABSPEC, PHURINE, GLUCOSEU, HGBUR, BILIRUBINUR, KETONESUR, PROTEINUR, UROBILINOGEN, NITRITE, LEUKOCYTESUR in the last 72 hours.  Invalid input(s): APPERANCEUR    Imaging: Dg Chest Portable 1 View  Result Date: 08/11/2017  CLINICAL DATA:  Acute shortness of breath. History of end-stage renal disease and CHF. EXAM: PORTABLE CHEST 1 VIEW COMPARISON:  06/15/2017 and prior radiographs FINDINGS: Cardiomegaly and right central venous catheter with tip overlying the mid-lower SVC again noted. Moderate bilateral interstitial and airspace opacities are compatible with edema. Trace bilateral pleural effusions are noted. There is no evidence of pneumothorax or acute bony abnormality. IMPRESSION: Cardiomegaly with moderate pulmonary edema and trace bilateral pleural effusions. Electronically Signed   By: Margarette Canada M.D.   On: 08/11/2017 15:51     Medications:    . ALPRAZolam  0.5 mg Oral BID  . amLODipine  10 mg Oral Daily  . calcium acetate  2,001 mg Oral TID WC  . calcium carbonate  1 tablet Oral Daily  . carvedilol  12.5 mg Oral BID WC  . chlorhexidine  15 mL Mouth Rinse BID  . ezetimibe  10 mg Oral Daily  . feeding supplement (ENSURE ENLIVE)  237 mL Oral TID BM  . furosemide  80 mg Oral Daily  .  heparin  5,000 Units Subcutaneous Q8H  . hydrALAZINE  50 mg Oral TID  . losartan  100 mg Oral Daily  . mouth rinse  15 mL Mouth Rinse q12n4p  . multivitamin  1 tablet Oral Daily  . neomycin-polymyxin-hydrocortisone  3 drop Right EAR Q8H  . pantoprazole  40 mg Oral BID  . sertraline  50 mg Oral QPM   albuterol, docusate sodium, heparin, hydrALAZINE, ipratropium-albuterol, nitroGLYCERIN, ondansetron (ZOFRAN) IV, traMADol  Assessment/ Plan:  61 y.o. African-American female with hypertension, hyperlipidemia, diastolic heart failure, hypertension, COPD, tobacco abuse, anemia, SHPTH, cocaine abuse  [place]. MWF. 3 hrs. 58.5 kg  1. End-stage renal disease on HD MWF:  - Patient had hemodialysis yesterday. She is due for another dialysis session today. Orders have been prepared.  2. Acute pulmonary edema:  Clinically significantly improved. Ultrafiltration achieved was 2.5 kg yesterday. We will plan for another 2 kg of ultrafiltration today.  3. Accelerated/Malignant hypertension:  Blood pressure currently 161/71. This should come down with additional ultrafiltration.  Otherwise continue amlodipine, carvedilol, hydralazine, and losartan.  4. Anemia of chronic kidney disease:  Hemoglobin currently 10.0. Hold off on Epogen for now.  5. Secondary hyperparathyroism:  Phosphorus currently 4.0 and at target. Continue to monitor bone mineral metabolism parameters.      LOS: 1 Michele Nash, [facility] 9/17/20189:42 AM  [facility] [place], [place]

## 2017-08-12 NOTE — Progress Notes (Signed)
HD COMPETED

## 2017-08-13 LAB — BASIC METABOLIC PANEL
Anion gap: 9 (ref 5–15)
BUN: 33 mg/dL — AB (ref 6–20)
CHLORIDE: 98 mmol/L — AB (ref 101–111)
CO2: 31 mmol/L (ref 22–32)
Calcium: 8.2 mg/dL — ABNORMAL LOW (ref 8.9–10.3)
Creatinine, Ser: 3.46 mg/dL — ABNORMAL HIGH (ref 0.44–1.00)
GFR calc Af Amer: 15 mL/min — ABNORMAL LOW (ref >60)
GFR calc non Af Amer: 13 mL/min — ABNORMAL LOW (ref >60)
GLUCOSE: 96 mg/dL (ref 65–99)
POTASSIUM: 3.9 mmol/L (ref 3.5–5.1)
Sodium: 138 mmol/L (ref 135–145)

## 2017-08-13 LAB — HEPATITIS B SURFACE ANTIGEN: Hepatitis B Surface Ag: NEGATIVE

## 2017-08-13 LAB — GLUCOSE, CAPILLARY: Glucose-Capillary: 134 mg/dL — ABNORMAL HIGH (ref 65–99)

## 2017-08-13 MED ORDER — HYDRALAZINE HCL 50 MG PO TABS: 50.0000 mg | ORAL_TABLET | Freq: Three times a day (TID) | ORAL | 0 refills | Status: DC

## 2017-08-13 MED ORDER — NICOTINE 14 MG/24HR TD PT24: 14.0000 mg | MEDICATED_PATCH | Freq: Every day | TRANSDERMAL | 0 refills | Status: DC

## 2017-08-13 NOTE — Discharge Instructions (Signed)
Heart Failure Clinic appointment on August 20 2017 at 11:40am with Darylene Price, [place]. Please call [phone] to reschedule.

## 2017-08-13 NOTE — Progress Notes (Signed)
Patient discharge teaching given, including activity, diet, follow-up appoints, and medications. Patient verbalized understanding of all discharge instructions. Hemodialysis catheter in right subclavian is clean and intact. IV access was d/c'd. Vitals are stable. Skin is intact except as charted in most recent assessments. Pt to be escorted out by volunteer, to be driven home by family.  Elizabeth Paulsen [employer]

## 2017-08-13 NOTE — Care Management (Signed)
Patient to discharge home today.  [name] liaison notified of discharge.  PT has assessed patient and does not recommend any follow up.  Patient has been provided with the option of home health nursing services by this [facility]  and other CM's previous admissions, and each time the patient declines.  Patient has chronic O2, and plan to discharge on her portable tank.

## 2017-08-13 NOTE — Progress Notes (Signed)
[place], [place] 08/13/17  Subjective:  Patient completed hemodialysis yesterday. She tolerated this quite well. She states that she's back to her baseline.    Objective:  Vital signs in last 24 hours:  Temp:  [97.7 F (36.5 C)-98.9 F (37.2 C)] 98.4 F (36.9 C) (09/18 0425) Pulse Rate:  [69-87] 75 (09/18 0425) Resp:  [13-24] 20 (09/18 0425) BP: (129-175)/(65-90) 174/76 (09/18 1016) SpO2:  [93 %-100 %] 100 % (09/18 0425)  Weight change:  Filed Weights   08/11/17 1543 08/11/17 1753 08/11/17 2055  Weight: 59 kg (130 lb) 61.6 kg (135 lb 12.9 oz) 65.4 kg (144 lb 2.9 oz)    Intake/Output:    Intake/Output Summary (Last 24 hours) at 08/13/17 1022 Last data filed at 08/13/17 1017  Gross per 24 hour  Intake             1200 ml  Output             2250 ml  Net            -1050 ml     Physical Exam: General: No acute distress  HEENT hearing intact, OM moist  Neck Mild distension of neck veins  Pulm/lungs Clear to auscultation bilateral, normal effort   CVS/Heart Regular, no rubs  Abdomen:  Soft, nontender   Extremities: No lower extremity edema   Neurologic: Alert, able to follow commands   Skin: No acute rashes   Access: Right IJ PermCath, left upper extremity aneurysmal AV fistula        Basic Metabolic Panel:   Recent Labs Lab 08/11/17 1540 08/11/17 1832 08/12/17 0427 08/13/17 0426  NA 141  --  136 138  K 5.1  --  4.4 3.9  CL 101  --  99* 98*  CO2 26  --  29 31  GLUCOSE 161*  --  143* 96  BUN 67*  --  28* 33*  CREATININE 8.07*  --  4.00* 3.46*  CALCIUM 8.4*  --  8.2* 8.2*  MG  --   --  1.7  --   PHOS  --  4.6 4.0  --      CBC:  Recent Labs Lab 08/11/17 1540 08/12/17 0427  WBC 8.2 8.2  NEUTROABS 4.7  --   HGB 11.6* 10.0*  HCT 35.2 29.9*  MCV 102.6* 101.9*  PLT 360 278      Lab Results  Component Value Date   HEPBSAG Negative 08/11/2017   HEPBSAB Reactive 05/28/2016      Microbiology:  Recent  Results (from the past 240 hour(s))  MRSA PCR Screening     Status: None   Collection Time: 08/11/17  5:51 PM  Result Value Ref Range Status   MRSA by PCR NEGATIVE NEGATIVE Final    Comment:        The GeneXpert MRSA Assay (FDA approved for NASAL specimens only), is one component of a comprehensive MRSA colonization surveillance program. It is not intended to diagnose MRSA infection nor to guide or monitor treatment for MRSA infections.     Coagulation Studies: No results for input(s): LABPROT, INR in the last 72 hours.  Urinalysis: No results for input(s): COLORURINE, LABSPEC, PHURINE, GLUCOSEU, HGBUR, BILIRUBINUR, KETONESUR, PROTEINUR, UROBILINOGEN, NITRITE, LEUKOCYTESUR in the last 72 hours.  Invalid input(s): APPERANCEUR    Imaging: Dg Chest Portable 1 View  Result Date: 08/11/2017 CLINICAL DATA:  Acute shortness of breath. History of end-stage renal disease and CHF. EXAM: PORTABLE CHEST 1 VIEW  COMPARISON:  06/15/2017 and prior radiographs FINDINGS: Cardiomegaly and right central venous catheter with tip overlying the mid-lower SVC again noted. Moderate bilateral interstitial and airspace opacities are compatible with edema. Trace bilateral pleural effusions are noted. There is no evidence of pneumothorax or acute bony abnormality. IMPRESSION: Cardiomegaly with moderate pulmonary edema and trace bilateral pleural effusions. Electronically Signed   By: Margarette Canada M.D.   On: 08/11/2017 15:51     Medications:    . amLODipine  10 mg Oral Daily  . calcium acetate  2,001 mg Oral TID WC  . calcium carbonate  1 tablet Oral Daily  . carvedilol  12.5 mg Oral BID WC  . chlorhexidine  15 mL Mouth Rinse BID  . ezetimibe  10 mg Oral Daily  . feeding supplement (ENSURE ENLIVE)  237 mL Oral TID BM  . furosemide  80 mg Oral Daily  . heparin  5,000 Units Subcutaneous Q8H  . hydrALAZINE  50 mg Oral TID  . losartan  100 mg Oral Daily  . mouth rinse  15 mL Mouth Rinse q12n4p  .  multivitamin  1 tablet Oral Daily  . neomycin-polymyxin-hydrocortisone  3 drop Right EAR Q8H  . nicotine  14 mg Transdermal Daily  . pantoprazole  40 mg Oral BID  . sertraline  50 mg Oral QPM   albuterol, ALPRAZolam, docusate sodium, heparin, heparin, hydrALAZINE, ipratropium-albuterol, nitroGLYCERIN, ondansetron (ZOFRAN) IV, traMADol  Assessment/ Plan:  61 y.o. African-American female with hypertension, hyperlipidemia, diastolic heart failure, hypertension, COPD, tobacco abuse, anemia, SHPTH, cocaine abuse  [place]. MWF. 3 hrs. 58.5 kg  1. End-stage renal disease on HD MWF:  - Patient significantly improved with dialysis over the past 2 consecutive days. No urgent indication for dialysis today. She will resume her normal outpatient schedule tomorrow.  2. Acute pulmonary edema:  As above patient significantly improved with 2 consecutive days of dialysis. No further need for dialysis at the moment.  3. Accelerated/Malignant hypertension:  Blood pressure continues to be labile. Continue amlodipine, carvedilol, hydralazine, and losartan.  4. Anemia of chronic kidney disease: Resume Epogen as an outpatient.  5. Secondary hyperparathyroism: Continue calcium acetate upon discharge. Phosphorous control is reasonable.      LOS: 2 Isabeau Mccalla, [facility] 9/18/201810:22 AM  [facility] [place], [place]

## 2017-08-13 NOTE — Care Management Important Message (Signed)
Important Message  Patient Details  Name: Michele Nash MRN: [id number] Date of Birth: [birth year]   Medicare Important Message Given:  N/A - LOS <3 / Initial given by admissions    Beverly Sessions, RN 08/13/2017, 12:04 PM

## 2017-08-13 NOTE — Discharge Summary (Signed)
[facility] NAME: Michele Nash    MR#:  [id number]  DATE OF BIRTH:  04/02/1956  DATE OF ADMISSION:  08/11/2017 ADMITTING PHYSICIAN: Vaughan Basta, MD  DATE OF DISCHARGE: 08/13/2017  PRIMARY CARE PHYSICIAN: [name], MD    ADMISSION DIAGNOSIS:  Flash pulmonary edema (HCC) [J81.0] Acute respiratory failure with hypoxia and hypercapnia (HCC) [J96.01, J96.02]  DISCHARGE DIAGNOSIS:  Active Problems:   Pulmonary edema   Acute respiratory failure with hypercapnia (HCC)   Acute on chronic diastolic CHF (congestive heart failure) (HCC)   Hypertensive urgency   Flash pulmonary edema (HCC)   SECONDARY DIAGNOSIS:   Past Medical History:  Diagnosis Date  . Altered mental status   . Anemia   . Apnea, sleep    for sleep study 10/17/15-no cpap yet  . Arthritis   . Bronchitis   . CHF (congestive heart failure) ([place])   . Chronic kidney disease   . COPD (chronic obstructive pulmonary disease) ([place])   . Depression   . Dialysis patient ([place]) 2014  . GERD (gastroesophageal reflux disease)   . GIB (gastrointestinal bleeding) 07/09/2016  . Headache   . Hyperlipidemia   . Hypertension   . Obesity   . Pulmonary edema 09/14/2016  . Renal dialysis device, implant, or graft complication    RIGHT CHEST CATH  . Respiratory failure ([place]) 07/23/2016  . Traumatic hematoma of forehead   . Type II diabetes mellitus ([place]) 09/21/2009   diet controlled    HOSPITAL COURSE:  61 -year-old female with history of end-stage renal disease on hemodialysis Monday, Wednesday and Friday and chronic diastolic heart failure who presented with shortness of breath.  1.acute hypoxic respiratory failure in the setting of pulmonary edema due to fluid overload from underlying kidney disease in addition to acute on chronic diastolic heart failure Patient was initially placed on BiPAP. She has weaned her baseline 4 L of oxygen and doing well.  2. ESRD  on hemodialysis requiring urgent hemodialysis on day of admission. Patient underwent dialysis for 2 sessions and is back to baseline. She will continue outpatient regimen of dialysis.   3. Acute on chronic diastolic heart failure with preserved ejection fraction: This is resolved after receiving dialysis.   4. Accelerated essential hypertension: Blood pressure is better controlled:  Continue hydralazine, losartan, Norvasc and Coreg  5. Hyperlipidemia: Continue statin 6.depression: Continue Zoloft  7. COPD without signs of exacerbation Continue when necessary DuoNeb's   DISCHARGE CONDITIONS AND DIET:   Stable for discharge and renal diet  CONSULTS OBTAINED:  Treatment Team:  Murlean Iba, MD  DRUG ALLERGIES:   Allergies  Allergen Reactions  . Sulfa Antibiotics Itching, Swelling, Rash and Other (See Comments)    Reaction:  Facial/body swelling     DISCHARGE MEDICATIONS:   Current Discharge Medication List    START taking these medications   Details  nicotine (NICODERM CQ - DOSED IN MG/24 HOURS) 14 mg/24hr patch Place 1 patch (14 mg total) onto the skin daily. Qty: 28 patch, Refills: 0      CONTINUE these medications which have CHANGED   Details  hydrALAZINE (APRESOLINE) 50 MG tablet Take 1 tablet (50 mg total) by mouth 3 (three) times daily. Qty: 90 tablet, Refills: 0      CONTINUE these medications which have NOT CHANGED   Details  albuterol (PROVENTIL HFA;VENTOLIN HFA) 108 (90 Base) MCG/ACT inhaler Inhale 2 puffs into the lungs every 6 (six) hours as needed for wheezing  or shortness of breath. Qty: 1 Inhaler, Refills: 1    amLODipine (NORVASC) 10 MG tablet Take 10 mg by mouth daily.     calcium acetate (PHOSLO) 667 MG capsule Take 3 capsules (2,001 mg total) by mouth 3 (three) times daily with meals. Qty: 270 capsule, Refills: 0    calcium carbonate (TUMS - DOSED IN MG ELEMENTAL CALCIUM) 500 MG chewable tablet Chew 1 tablet by mouth daily.     carvedilol (COREG) 12.5 MG tablet Take 12.5 mg by mouth 2 (two) times daily with a meal.    ezetimibe (ZETIA) 10 MG tablet Take 10 mg by mouth daily.     fluticasone (FLONASE) 50 MCG/ACT nasal spray Place 2 sprays into both nostrils daily as needed for rhinitis.    ipratropium-albuterol (DUONEB) 0.5-2.5 (3) MG/3ML SOLN Take 3 mLs by nebulization every 6 (six) hours as needed. Qty: 360 mL, Refills: 0    lidocaine-prilocaine (EMLA) cream Apply 1 application topically as needed (prior to accessing port).    losartan (COZAAR) 100 MG tablet Take 100 mg by mouth daily.     multivitamin (RENA-VIT) TABS tablet Take 1 tablet by mouth at bedtime.     ondansetron (ZOFRAN) 4 MG tablet Take 1 tablet (4 mg total) by mouth every 8 (eight) hours as needed for nausea or vomiting. Qty: 10 tablet, Refills: 0    pantoprazole (PROTONIX) 40 MG tablet Take 40 mg by mouth 2 (two) times daily.    sertraline (ZOLOFT) 50 MG tablet Take 50 mg by mouth daily.     traMADol (ULTRAM) 50 MG tablet Take 1 tablet (50 mg total) by mouth every 12 (twelve) hours as needed for severe pain. Qty: 20 tablet, Refills: 0    ALPRAZolam (XANAX) 0.5 MG tablet Take 0.5 mg by mouth 2 (two) times daily.    Amino Acids-Protein Hydrolys (FEEDING SUPPLEMENT, PRO-STAT SUGAR FREE 64,) LIQD Take 30 mLs by mouth daily. Qty: 900 mL, Refills: 0    feeding supplement, ENSURE ENLIVE, (ENSURE ENLIVE) LIQD Take 237 mLs by mouth 3 (three) times daily between meals. Qty: 237 mL, Refills: 12    furosemide (LASIX) 40 MG tablet Take 40 mg by mouth every Tuesday, Thursday, Saturday, and Sunday.       STOP taking these medications     amoxicillin-clavulanate (AUGMENTIN) 500-125 MG tablet           Today   CHIEF COMPLAINT:   Doing very well. Ready for discharge today.   VITAL SIGNS:  Blood pressure (!) 144/65, pulse 75, temperature 98.4 F (36.9 C), temperature source Oral, resp. rate 20, height 5\' 5"  (1.651 m), weight 65.4 kg  (144 lb 2.9 oz), SpO2 100 %.   REVIEW OF SYSTEMS:  Review of Systems  Constitutional: Negative.  Negative for chills, fever and malaise/fatigue.  HENT: Negative.  Negative for ear discharge, ear pain, hearing loss, nosebleeds and sore throat.   Eyes: Negative.  Negative for blurred vision and pain.  Respiratory: Negative.  Negative for cough, hemoptysis, shortness of breath and wheezing.   Cardiovascular: Negative.  Negative for chest pain, palpitations and leg swelling.  Gastrointestinal: Negative.  Negative for abdominal pain, blood in stool, diarrhea, nausea and vomiting.  Genitourinary: Negative.  Negative for dysuria.  Musculoskeletal: Negative.  Negative for back pain.  Skin: Negative.   Neurological: Negative for dizziness, tremors, speech change, focal weakness, seizures and headaches.  Endo/Heme/Allergies: Negative.  Does not bruise/bleed easily.  Psychiatric/Behavioral: Negative.  Negative for depression, hallucinations and suicidal ideas.  PHYSICAL EXAMINATION:  GENERAL:  61 y.o.-year-old patient lying in the bed with no acute distress.  NECK:  Supple, no jugular venous distention. No thyroid enlargement, no tenderness.  LUNGS: Normal breath sounds bilaterally, no wheezing, rales,rhonchi  No use of accessory muscles of respiration.  CARDIOVASCULAR: S1, S2 normal. No murmurs, rubs, or gallops.  ABDOMEN: Soft, non-tender, non-distended. Bowel sounds present. No organomegaly or mass.  EXTREMITIES: No pedal edema, cyanosis, or clubbing.  PSYCHIATRIC: The patient is alert and oriented x 3.  SKIN: No obvious rash, lesion, or ulcer.   DATA REVIEW:   CBC  Recent Labs Lab 08/12/17 0427  WBC 8.2  HGB 10.0*  HCT 29.9*  PLT 278    Chemistries   Recent Labs Lab 08/12/17 0427 08/13/17 0426  NA 136 138  K 4.4 3.9  CL 99* 98*  CO2 29 31  GLUCOSE 143* 96  BUN 28* 33*  CREATININE 4.00* 3.46*  CALCIUM 8.2* 8.2*  MG 1.7  --     Cardiac Enzymes  Recent  Labs Lab 08/11/17 1540  TROPONINI 0.06*    Microbiology Results  @[email or web address] @  RADIOLOGY:  Dg Chest Portable 1 View  Result Date: 08/11/2017 CLINICAL DATA:  Acute shortness of breath. History of end-stage renal disease and CHF. EXAM: PORTABLE CHEST 1 VIEW COMPARISON:  06/15/2017 and prior radiographs FINDINGS: Cardiomegaly and right central venous catheter with tip overlying the mid-lower SVC again noted. Moderate bilateral interstitial and airspace opacities are compatible with edema. Trace bilateral pleural effusions are noted. There is no evidence of pneumothorax or acute bony abnormality. IMPRESSION: Cardiomegaly with moderate pulmonary edema and trace bilateral pleural effusions. Electronically Signed   By: Margarette Canada M.D.   On: 08/11/2017 15:51      Current Discharge Medication List    START taking these medications   Details  nicotine (NICODERM CQ - DOSED IN MG/24 HOURS) 14 mg/24hr patch Place 1 patch (14 mg total) onto the skin daily. Qty: 28 patch, Refills: 0      CONTINUE these medications which have CHANGED   Details  hydrALAZINE (APRESOLINE) 50 MG tablet Take 1 tablet (50 mg total) by mouth 3 (three) times daily. Qty: 90 tablet, Refills: 0      CONTINUE these medications which have NOT CHANGED   Details  albuterol (PROVENTIL HFA;VENTOLIN HFA) 108 (90 Base) MCG/ACT inhaler Inhale 2 puffs into the lungs every 6 (six) hours as needed for wheezing or shortness of breath. Qty: 1 Inhaler, Refills: 1    amLODipine (NORVASC) 10 MG tablet Take 10 mg by mouth daily.     calcium acetate (PHOSLO) 667 MG capsule Take 3 capsules (2,001 mg total) by mouth 3 (three) times daily with meals. Qty: 270 capsule, Refills: 0    calcium carbonate (TUMS - DOSED IN MG ELEMENTAL CALCIUM) 500 MG chewable tablet Chew 1 tablet by mouth daily.    carvedilol (COREG) 12.5 MG tablet Take 12.5 mg by mouth 2 (two) times daily with a meal.    ezetimibe (ZETIA) 10 MG tablet Take 10 mg by mouth  daily.     fluticasone (FLONASE) 50 MCG/ACT nasal spray Place 2 sprays into both nostrils daily as needed for rhinitis.    ipratropium-albuterol (DUONEB) 0.5-2.5 (3) MG/3ML SOLN Take 3 mLs by nebulization every 6 (six) hours as needed. Qty: 360 mL, Refills: 0    lidocaine-prilocaine (EMLA) cream Apply 1 application topically as needed (prior to accessing port).    losartan (COZAAR) 100 MG tablet Take 100 mg by  mouth daily.     multivitamin (RENA-VIT) TABS tablet Take 1 tablet by mouth at bedtime.     ondansetron (ZOFRAN) 4 MG tablet Take 1 tablet (4 mg total) by mouth every 8 (eight) hours as needed for nausea or vomiting. Qty: 10 tablet, Refills: 0    pantoprazole (PROTONIX) 40 MG tablet Take 40 mg by mouth 2 (two) times daily.    sertraline (ZOLOFT) 50 MG tablet Take 50 mg by mouth daily.     traMADol (ULTRAM) 50 MG tablet Take 1 tablet (50 mg total) by mouth every 12 (twelve) hours as needed for severe pain. Qty: 20 tablet, Refills: 0    ALPRAZolam (XANAX) 0.5 MG tablet Take 0.5 mg by mouth 2 (two) times daily.    Amino Acids-Protein Hydrolys (FEEDING SUPPLEMENT, PRO-STAT SUGAR FREE 64,) LIQD Take 30 mLs by mouth daily. Qty: 900 mL, Refills: 0    feeding supplement, ENSURE ENLIVE, (ENSURE ENLIVE) LIQD Take 237 mLs by mouth 3 (three) times daily between meals. Qty: 237 mL, Refills: 12    furosemide (LASIX) 40 MG tablet Take 40 mg by mouth every Tuesday, Thursday, Saturday, and Sunday.       STOP taking these medications     amoxicillin-clavulanate (AUGMENTIN) 500-125 MG tablet            Management plans discussed with the patient and she is in agreement. Stable for discharge home  Patient should follow up with pcp  CODE STATUS:     Code Status Orders        Start     Ordered   08/11/17 1757  Full code  Continuous     09 /16/18 1756    Code Status History    Date Active Date Inactive Code Status Order ID Comments User Context   06/15/2017  7:42 PM  06/20/2017  4:52 PM Full Code [id number]  Idelle Crouch, MD Inpatient   05/21/2017  5:58 PM 05/22/2017  5:24 PM Full Code [id number]  Nicholes Mango, MD Inpatient   12/04/2016  9:55 AM 12/11/2016  5:41 PM Full Code [id number]  Fritzi Mandes, MD Inpatient   10/29/2016 12:14 PM 10/30/2016  7:43 PM Full Code [id number]  Laverle Hobby, MD ED   10/06/2016  8:33 PM 10/11/2016  3:54 PM Full Code [id number]  Mikael Spray, NP ED   09/14/2016  9:19 AM 09/16/2016  4:07 PM Full Code [id number]  Laverle Hobby, MD ED   07/23/2016 11:05 AM 07/24/2016 10:49 PM Full Code [id number]  Wilhelmina Mcardle, MD ED   07/09/2016  6:43 PM 07/13/2016  5:23 PM Full Code [id number]  Hower, Aaron Mose, MD ED   05/28/2016  2:54 PM 06/02/2016  2:51 PM Full Code [id number]  Theodoro Grist, MD Inpatient   04/25/2016  6:04 PM 04/26/2016  9:17 PM Full Code [id number]  Nicholes Mango, MD Inpatient   10/28/2015  6:50 PM 10/30/2015  6:09 PM Full Code [id number]  Theodoro Grist, MD Inpatient   03/30/2015 12:51 AM 03/31/2015  4:44 PM Full Code [id number]  Juluis Mire, MD Inpatient    Advance Directive Documentation     Most Recent Value  Type of Advance Directive  Healthcare Power of Attorney  Pre-existing out of facility DNR order (yellow form or pink MOST form)  -  "MOST" Form in Place?  -      TOTAL TIME TAKING CARE OF THIS PATIENT: 62minutes.    Note: This dictation was prepared with Dragon dictation along with smaller phrase technology. Any  transcriptional errors that result from this process are unintentional.  Durrell Barajas M.D on 08/13/2017 at 9:13 AM  Between 7am to 6pm - Pager - [phone] After 6pm go to [email or web address] - password [email or web address] [facility]  Office  [phone]  CC: Primary care physician; [name], MD

## 2017-08-20 ENCOUNTER — Ambulatory Visit: Payer: Medicare Other | Admitting: Family

## 2017-08-27 ENCOUNTER — Ambulatory Visit: Payer: Medicare Other | Attending: Family | Admitting: Family

## 2017-08-27 ENCOUNTER — Encounter: Payer: Self-pay | Admitting: Family

## 2017-08-27 VITALS — BP 155/96 | HR 70 | Resp 20 | Ht 65.0 in | Wt 128.0 lb

## 2017-08-27 DIAGNOSIS — I132 Hypertensive heart and chronic kidney disease with heart failure and with stage 5 chronic kidney disease, or end stage renal disease: Secondary | ICD-10-CM | POA: Insufficient documentation

## 2017-08-27 DIAGNOSIS — J449 Chronic obstructive pulmonary disease, unspecified: Secondary | ICD-10-CM | POA: Insufficient documentation

## 2017-08-27 DIAGNOSIS — Z992 Dependence on renal dialysis: Secondary | ICD-10-CM | POA: Insufficient documentation

## 2017-08-27 DIAGNOSIS — E1122 Type 2 diabetes mellitus with diabetic chronic kidney disease: Secondary | ICD-10-CM | POA: Insufficient documentation

## 2017-08-27 DIAGNOSIS — M79605 Pain in left leg: Secondary | ICD-10-CM | POA: Insufficient documentation

## 2017-08-27 DIAGNOSIS — E785 Hyperlipidemia, unspecified: Secondary | ICD-10-CM | POA: Diagnosis not present

## 2017-08-27 DIAGNOSIS — F329 Major depressive disorder, single episode, unspecified: Secondary | ICD-10-CM | POA: Diagnosis not present

## 2017-08-27 DIAGNOSIS — E669 Obesity, unspecified: Secondary | ICD-10-CM | POA: Insufficient documentation

## 2017-08-27 DIAGNOSIS — N186 End stage renal disease: Secondary | ICD-10-CM | POA: Diagnosis not present

## 2017-08-27 DIAGNOSIS — E875 Hyperkalemia: Secondary | ICD-10-CM | POA: Diagnosis not present

## 2017-08-27 DIAGNOSIS — Z87891 Personal history of nicotine dependence: Secondary | ICD-10-CM | POA: Insufficient documentation

## 2017-08-27 DIAGNOSIS — I5032 Chronic diastolic (congestive) heart failure: Secondary | ICD-10-CM | POA: Diagnosis not present

## 2017-08-27 DIAGNOSIS — K219 Gastro-esophageal reflux disease without esophagitis: Secondary | ICD-10-CM | POA: Insufficient documentation

## 2017-08-27 DIAGNOSIS — R0609 Other forms of dyspnea: Secondary | ICD-10-CM | POA: Insufficient documentation

## 2017-08-27 DIAGNOSIS — I10 Essential (primary) hypertension: Secondary | ICD-10-CM

## 2017-08-27 NOTE — Progress Notes (Signed)
Patient ID: Michele Nash, female    DOB: [birth year], 61 y.o.   MRN: [id number]  HPI  Michele Nash is a 61 y/o female with a history of   Reviewed echo report from 04/25/16 which showed an EF of 70% along with trivial MR/TR.   Admitted 08/11/17 due to pulmonary edema from underlying renal disease. Initially needed bipap and then weaned to her baseline 4L of oxygen. Dialysis for ESRD. Discharged home after 2 days. Was in the ED 08/04/17 due to left leg pain. Evaluated and released home. Admitted 06/15/17 due to uncontrolled HTN and HF. Medications were adjusted. Discharged after 5 days with antibiotics. Was in the ED 06/05/17 due to chest pain. Was treated and released. Admitted 05/21/17 due to hyperkalemia. Treated with kayexalate and hemodialysis and was discharged the next day.   She presents today for her initial visit with a chief complaint of mild shortness of breath upon moderate exertion. She describes this as chronic in nature over the last few months. She has associated fatigue, nasal congestion and difficulty sleeping. She denies chest pain, edema, palpitations, dizziness or weight gain.   Past Medical History:  Diagnosis Date  . Altered mental status   . Anemia   . Apnea, sleep    for sleep study 10/17/15-no cpap yet  . Arthritis   . Bronchitis   . CHF (congestive heart failure) ([place])   . Chronic kidney disease   . COPD (chronic obstructive pulmonary disease) ([place])   . Depression   . Dialysis patient ([place]) 2014  . GERD (gastroesophageal reflux disease)   . GIB (gastrointestinal bleeding) 07/09/2016  . Headache   . Hyperlipidemia   . Hypertension   . Obesity   . Pulmonary edema 09/14/2016  . Renal dialysis device, implant, or graft complication    RIGHT CHEST CATH  . Respiratory failure ([place]) 07/23/2016  . Traumatic hematoma of forehead   . Type II diabetes mellitus ([place]) 09/21/2009   diet controlled   Past Surgical History:  Procedure Laterality Date  . ABDOMINAL HYSTERECTOMY     . COLONOSCOPY  2011  . COLONOSCOPY WITH PROPOFOL N/A 09/20/2015   Procedure: COLONOSCOPY WITH PROPOFOL;  Surgeon: Lucilla Lame, MD;  Location: [facility] ENDOSCOPY;  Service: Endoscopy;  Laterality: N/A;  . DIALYSIS FISTULA CREATION    . ESOPHAGOGASTRODUODENOSCOPY N/A 07/13/2016   Procedure: ESOPHAGOGASTRODUODENOSCOPY (EGD);  Surgeon: Lollie Sails, MD;  Location[facility] ENDOSCOPY;  Service: Endoscopy;  Laterality: N/A;  . ESOPHAGOGASTRODUODENOSCOPY (EGD) WITH PROPOFOL N/A 06/02/2016   Procedure: ESOPHAGOGASTRODUODENOSCOPY (EGD) WITH PROPOFOL;  Surgeon: Manya Silvas, MD;  [facility]COPY;  Service: Endoscopy;  Laterality: N/A;  . LIGATION OF ARTERIOVENOUS  FISTULA Left 05/24/2017   Procedure: LIGATION OF ARTERIOVENOUS  FISTULA ( PERMCATH INSERTION );  Surgeon: Katha Cabal, MD;  Location: [facility] ORS;  Service: Vascular;  Laterality: Left;  . PERIPHERAL VASCULAR CATHETERIZATION N/A 05/10/2015   Procedure: A/V Shuntogram/Fistulagram;  Surgeon: Katha Cabal, MD;  Location: [place] CV LAB;  Service: Cardiovascular;  Laterality: N/A;  . PERIPHERAL VASCULAR CATHETERIZATION Left 05/10/2015   Procedure: A/V Shunt Intervention;  Surgeon: Katha Cabal, MD;  Location: [place] CV LAB;  Service: Cardiovascular;  Laterality: Left;  . PERIPHERAL VASCULAR CATHETERIZATION Left 08/23/2015   Procedure: A/V Shuntogram/Fistulagram;  Surgeon: Katha Cabal, MD;  Location: [place] CV LAB;  Service: Cardiovascular;  Laterality: Left;  . PERIPHERAL VASCULAR CATHETERIZATION N/A 08/23/2015   Procedure: A/V Shunt Intervention;  Surgeon: Katha Cabal, MD;  Location: [place] CV LAB;  Service: Cardiovascular;  Laterality: N/A;  . PERIPHERAL VASCULAR CATHETERIZATION  08/23/2015   Procedure: Dialysis/Perma Catheter Insertion;  Surgeon: Katha Cabal, MD;  Location: [place] CV LAB;  Service: Cardiovascular;;  . PERIPHERAL VASCULAR CATHETERIZATION N/A 01/03/2016    Procedure: Dialysis/Perma Catheter Removal;  Surgeon: Katha Cabal, MD;  Location: [place] CV LAB;  Service: Cardiovascular;  Laterality: N/A;  . REVISON OF ARTERIOVENOUS FISTULA Left 10/28/2015   Procedure: REVISON OF ARTERIOVENOUS FISTULA WITH ARTEGRAFT;  Surgeon: Katha Cabal, MD;  Location: [facility] ORS;  Service: Vascular;  Laterality: Left;  . WOUND DEBRIDEMENT Left 10/28/2015   Procedure: Resection of shoulder cyst ( left );  Surgeon: Katha Cabal, MD;  Location: [facility] ORS;  Service: Vascular;  Laterality: Left;   Family History  Problem Relation Age of Onset  . Heart disease Mother   . Cancer Father   . Cancer Sister   . Breast cancer Sister    Social History  Substance Use Topics  . Smoking status: Former Smoker    Years: 40.00    Types: Cigarettes    Quit date: 07/28/2017  . Smokeless tobacco: Never Used  . Alcohol use No   Allergies  Allergen Reactions  . Sulfa Antibiotics Itching, Swelling, Rash and Other (See Comments)    Reaction:  Facial/body swelling    Prior to Admission medications   Medication Sig Start Date End Date Taking? Authorizing Provider  albuterol (PROVENTIL HFA;VENTOLIN HFA) 108 (90 Base) MCG/ACT inhaler Inhale 2 puffs into the lungs every 6 (six) hours as needed for wheezing or shortness of breath. 12/11/16  Yes Gouru, Illene Silver, MD  ALPRAZolam Duanne Moron) 0.5 MG tablet Take 0.5 mg by mouth 2 (two) times daily.   Yes [provider]  Amino Acids-Protein Hydrolys (FEEDING SUPPLEMENT, PRO-STAT SUGAR FREE 64,) LIQD Take 30 mLs by mouth daily. 12/12/16  Yes Gouru, Illene Silver, MD  amLODipine (NORVASC) 10 MG tablet Take 10 mg by mouth daily.    Yes [provider]  calcium acetate (PHOSLO) 667 MG capsule Take 3 capsules (2,001 mg total) by mouth 3 (three) times daily with meals. 06/20/17  Yes Wieting, Richard, MD  calcium carbonate (TUMS - DOSED IN MG ELEMENTAL CALCIUM) 500 MG chewable tablet Chew 1 tablet by mouth daily.   Yes [provider]  carvedilol (COREG) 12.5 MG tablet Take 12.5 mg by mouth 2 (two) times daily with a meal.   Yes [provider]  cinacalcet (SENSIPAR) 30 MG tablet Take 30 mg by mouth daily.   Yes [provider]  ezetimibe (ZETIA) 10 MG tablet Take 10 mg by mouth daily.    Yes [provider]  fluticasone (FLONASE) 50 MCG/ACT nasal spray Place 2 sprays into both nostrils daily as needed for rhinitis.   Yes [provider]  furosemide (LASIX) 40 MG tablet Take 40 mg by mouth daily.    Yes [provider]  hydrALAZINE (APRESOLINE) 50 MG tablet Take 1 tablet (50 mg total) by mouth 3 (three) times daily. 08/13/17  Yes Mody, Sital, MD  ipratropium-albuterol (DUONEB) 0.5-2.5 (3) MG/3ML SOLN Take 3 mLs by nebulization every 6 (six) hours as needed. 12/11/16  Yes Gouru, Illene Silver, MD  lidocaine-prilocaine (EMLA) cream Apply 1 application topically as needed (prior to accessing port).   Yes [provider]  losartan (COZAAR) 100 MG tablet Take 100 mg by mouth daily.    Yes [provider]  multivitamin (RENA-VIT) TABS tablet Take 1  tablet by mouth at bedtime.    Yes [provider]  ondansetron (ZOFRAN) 4 MG tablet Take 1 tablet (4 mg total) by mouth every 8 (eight) hours as needed for nausea or vomiting. 03/24/17  Yes Lisa Roca, MD  pantoprazole (PROTONIX) 40 MG tablet Take 40 mg by mouth 2 (two) times daily.   Yes [provider]  sertraline (ZOLOFT) 50 MG tablet Take 50 mg by mouth daily.    Yes [provider]  traMADol (ULTRAM) 50 MG tablet Take 1 tablet (50 mg total) by mouth every 12 (twelve) hours as needed for severe pain. 08/04/17 08/04/18 Yes Arta Silence, MD    Review of Systems  Constitutional: Positive for fatigue. Negative for appetite change.  HENT: Positive for congestion, ear pain (left ear) and postnasal drip. Negative for sore throat.   Eyes: Negative.   Respiratory: Positive for shortness of  breath. Negative for chest tightness.   Cardiovascular: Negative for chest pain, palpitations and leg swelling.  Gastrointestinal: Negative for abdominal distention and abdominal pain.  Endocrine: Negative.   Genitourinary: Negative.   Musculoskeletal: Negative for back pain and neck pain.  Skin: Negative.   Allergic/Immunologic: Negative.   Neurological: Negative for dizziness and light-headedness.  Hematological: Negative for adenopathy. Does not bruise/bleed easily.  Psychiatric/Behavioral: Positive for sleep disturbance (waking up frequently; wearing CPAP and oxygen). Negative for dysphoric mood. The patient is not nervous/anxious.    Vitals:   08/27/17 1203  BP: (!) 155/96  Pulse: 70  Resp: 20  SpO2: 98%  Weight: 128 lb (58.1 kg)  Height: 5\' 5"  (1.651 m)   Wt Readings from Last 3 Encounters:  08/27/17 128 lb (58.1 kg)  08/11/17 144 lb 2.9 oz (65.4 kg)  08/04/17 130 lb (59 kg)   Lab Results  Component Value Date   CREATININE 3.46 (H) 08/13/2017   CREATININE 4.00 (H) 08/12/2017   CREATININE 8.07 (H) 08/11/2017   Physical Exam  Constitutional: She is oriented to person, place, and time. She appears well-developed and well-nourished.  HENT:  Head: Normocephalic and atraumatic.  Neck: Normal range of motion. Neck supple. No JVD present.  Cardiovascular: Normal rate and regular rhythm.   Pulmonary/Chest: Effort normal. She has no wheezes. She has no rales.  Abdominal: Soft. She exhibits no distension. There is no tenderness.  Musculoskeletal: She exhibits no edema or tenderness.  Neurological: She is alert and oriented to person, place, and time.  Skin: Skin is warm and dry.  Psychiatric: She has a normal mood and affect. Her behavior is normal. Thought content normal.  Nursing note and vitals reviewed.  Assessment & Plan:  1:Chronic heart failure with preserved ejection fraction- - NYHA class II - euvolemic - weighing daily. Instructed to call for an overnight  weight gain of >2 pounds or a weekly weight gain of >5 pounds - does add salt to her foods at times; discussed the importance of closely following a 2000mg  sodium diet and written dietary information was given to her about this - trying to remain active and does allow for frequent rest periods - BMP from 08/13/17 reviewed and showed sodium 138, potassium 3.9 and GFR 15.   2: HTN- - BP slightly elevated today but she hasn't taken her medications yet; will do so as soon as she returns home - follows with PCP Michele Nash) about her BP  3: ESRD on dialysis- - received dialysis on M, W, F - has left upper arm fistula that is enlarged and patient says is  tender when it's accessed; + thrill present - she is using a port-a-cath for her dialysis until they can figure out what is wrong with the fistula  4: Tobacco use- - recently quit smoking on 07/28/17 - congratulated her on that and encouraged to continue smoking cessation. This was discussed for 3 minutes with the patient.   Patient did not bring her medications nor a list. Each medication was verbally reviewed with the patient and she was encouraged to bring the bottles to every visit to confirm accuracy of list.  Return in 1 month or sooner for any questions/problems before then

## 2017-08-27 NOTE — Patient Instructions (Signed)
Begin weighing daily and call for an overnight weight gain of > 2 pounds or a weekly weight gain of >5 pounds. 

## 2017-08-29 ENCOUNTER — Encounter: Payer: Self-pay | Admitting: Family

## 2017-09-11 ENCOUNTER — Emergency Department: Payer: Medicare Other

## 2017-09-11 ENCOUNTER — Encounter: Payer: Self-pay | Admitting: Emergency Medicine

## 2017-09-11 ENCOUNTER — Emergency Department
Admission: EM | Admit: 2017-09-11 | Discharge: 2017-09-11 | Disposition: A | Discharge status: Home / Self Care | Payer: Medicare Other | Attending: Emergency Medicine | Admitting: Emergency Medicine

## 2017-09-11 DIAGNOSIS — E1122 Type 2 diabetes mellitus with diabetic chronic kidney disease: Secondary | ICD-10-CM | POA: Insufficient documentation

## 2017-09-11 DIAGNOSIS — N186 End stage renal disease: Secondary | ICD-10-CM | POA: Diagnosis not present

## 2017-09-11 DIAGNOSIS — J449 Chronic obstructive pulmonary disease, unspecified: Secondary | ICD-10-CM | POA: Diagnosis not present

## 2017-09-11 DIAGNOSIS — I5032 Chronic diastolic (congestive) heart failure: Secondary | ICD-10-CM | POA: Diagnosis not present

## 2017-09-11 DIAGNOSIS — Z79899 Other long term (current) drug therapy: Secondary | ICD-10-CM | POA: Diagnosis not present

## 2017-09-11 DIAGNOSIS — Z992 Dependence on renal dialysis: Secondary | ICD-10-CM | POA: Diagnosis not present

## 2017-09-11 DIAGNOSIS — Z87891 Personal history of nicotine dependence: Secondary | ICD-10-CM | POA: Insufficient documentation

## 2017-09-11 DIAGNOSIS — R600 Localized edema: Secondary | ICD-10-CM

## 2017-09-11 DIAGNOSIS — I132 Hypertensive heart and chronic kidney disease with heart failure and with stage 5 chronic kidney disease, or end stage renal disease: Secondary | ICD-10-CM | POA: Insufficient documentation

## 2017-09-11 DIAGNOSIS — R2241 Localized swelling, mass and lump, right lower limb: Secondary | ICD-10-CM | POA: Diagnosis present

## 2017-09-11 MED ORDER — HYDROCHLOROTHIAZIDE 25 MG PO TABS: 25.0000 mg | ORAL_TABLET | Freq: Every day | ORAL | 0 refills | Status: DC

## 2017-09-11 MED ORDER — HYDROCHLOROTHIAZIDE 25 MG PO TABS
25.0000 mg | ORAL_TABLET | Freq: Once | ORAL | Status: AC
  Administered 2017-09-11: 25 mg via ORAL
  Filled 2017-09-11: qty 1

## 2017-09-11 NOTE — ED Triage Notes (Signed)
C/O left foot pain and swelling x 1 week.  Denies injury.

## 2017-09-11 NOTE — ED Notes (Signed)
Patient is complaining of right foot pain and swelling x 2 weeks.  Patient states she has noticed increased pain to her right lower leg as well.  Patient is a dialysis patient and is on home oxygen at 4L [place] all the time.  Patient is alert and oriented x 4 at this time.

## 2017-09-11 NOTE — ED Notes (Signed)
Patient ambulatory to restroom without assistance. 

## 2017-09-11 NOTE — ED Provider Notes (Signed)
[facility] Emergency Department Provider Note ____________________________________________  Time seen: 1214  I have reviewed the triage vital signs and the nursing notes.  HISTORY  Chief Complaint  Foot Pain  HPI Michele Nash is a 61 y.o. female pain and swelling in the right leg and foot for the last few days. She noted a similar, self-limited episode 3 months prior. She is ambulatory, but notes pain. No fevers, chills, sweats, or trauma. She denies a history of gout or OA. She denies any alleviating measures. She notes a "pins & needles" sensation to the dorsolateral aspect of the right foot & ankle. She had her last dialysis on yesterday for ESRD. She also has CHF, COPD, DM, and oxygen therapy.   Past Medical History:  Diagnosis Date  . Altered mental status   . Anemia   . Apnea, sleep    for sleep study 10/17/15-no cpap yet  . Arthritis   . Bronchitis   . CHF (congestive heart failure) ([place])   . Chronic kidney disease   . COPD (chronic obstructive pulmonary disease) ([place])   . Depression   . Dialysis patient ([place]) 2014  . GERD (gastroesophageal reflux disease)   . GIB (gastrointestinal bleeding) 07/09/2016  . Headache   . Hyperlipidemia   . Hypertension   . Obesity   . Pulmonary edema 09/14/2016  . Renal dialysis device, implant, or graft complication    RIGHT CHEST CATH  . Respiratory failure ([place]) 07/23/2016  . Traumatic hematoma of forehead   . Type II diabetes mellitus ([place]) 09/21/2009   diet controlled    Patient Active Problem List   Diagnosis Date Noted  . Hypertensive urgency 08/11/2017  . UTI (urinary tract infection) 06/17/2017  . Dizziness 06/15/2017  . Chronic diastolic CHF (congestive heart failure) ([place]) 06/15/2017  . Hyperkalemia 05/21/2017  . Multiple closed fractures of ribs of right side   . Hyperlipidemia 11/28/2016  . Altered mental status   . Fall   . Traumatic hematoma of forehead   . Protein-calorie malnutrition,  severe 05/29/2016  . Hyperglycemia 05/28/2016  . Pain in limb 10/28/2015  . ESRD on dialysis ([place]) 10/28/2015  . HTN (hypertension) 03/29/2015  . COPD (chronic obstructive pulmonary disease) ([place]) 03/29/2015  . GAD (generalized anxiety disorder) 03/29/2015  . Chronic kidney disease 10/18/2009  . Personal history of tobacco use, presenting hazards to health 10/18/2009  . Type II diabetes mellitus ([place]) 09/21/2009  . Disorder of uterus 02/14/1990    Past Surgical History:  Procedure Laterality Date  . ABDOMINAL HYSTERECTOMY    . COLONOSCOPY  2011  . COLONOSCOPY WITH PROPOFOL N/A 09/20/2015   Procedure: COLONOSCOPY WITH PROPOFOL;  Surgeon: Lucilla Lame, MD;  Location: [facility] ENDOSCOPY;  Service: Endoscopy;  Laterality: N/A;  . DIALYSIS FISTULA CREATION    . ESOPHAGOGASTRODUODENOSCOPY N/A 07/13/2016   Procedure: ESOPHAGOGASTRODUODENOSCOPY (EGD);  Surgeon: Lollie Sails, MD;  [facility] ENDOSCOPY;  Service: Endoscopy;  Laterality: N/A;  . ESOPHAGOGASTRODUODENOSCOPY (EGD) WITH PROPOFOL N/A 06/02/2016   Procedure: ESOPHAGOGASTRODUODENOSCOPY (EGD) WITH PROPOFOL;  Surgeon: Manya Silvas, MD;  Location: [facility] ENDOSCOPY;  Service: Endoscopy;  Laterality: N/A;  . LIGATION OF ARTERIOVENOUS  FISTULA Left 05/24/2017   Procedure: LIGATION OF ARTERIOVENOUS  FISTULA ( PERMCATH INSERTION );  Surgeon: Katha Cabal, MD;  Location: [facility] ORS;  Service: Vascular;  Laterality: Left;  . PERIPHERAL VASCULAR CATHETERIZATION N/A 05/10/2015   Procedure: A/V Shuntogram/Fistulagram;  Surgeon: Katha Cabal, MD;  Location: [place] CV LAB;  Service: Cardiovascular;  Laterality: N/A;  . PERIPHERAL VASCULAR CATHETERIZATION Left 05/10/2015   Procedure: A/V Shunt Intervention;  Surgeon: Katha Cabal, MD;  Location: [place] CV LAB;  Service: Cardiovascular;  Laterality: Left;  . PERIPHERAL VASCULAR CATHETERIZATION Left 08/23/2015   Procedure: A/V Shuntogram/Fistulagram;  Surgeon: Katha Cabal, MD;  Location: [place] CV LAB;  Service: Cardiovascular;  Laterality: Left;  . PERIPHERAL VASCULAR CATHETERIZATION N/A 08/23/2015   Procedure: A/V Shunt Intervention;  Surgeon: Katha Cabal, MD;  Location: [place] CV LAB;  Service: Cardiovascular;  Laterality: N/A;  . PERIPHERAL VASCULAR CATHETERIZATION  08/23/2015   Procedure: Dialysis/Perma Catheter Insertion;  Surgeon: Katha Cabal, MD;  Location: [place] CV LAB;  Service: Cardiovascular;;  . PERIPHERAL VASCULAR CATHETERIZATION N/A 01/03/2016   Procedure: Dialysis/Perma Catheter Removal;  Surgeon: Katha Cabal, MD;  Location: [place] CV LAB;  Service: Cardiovascular;  Laterality: N/A;  . REVISON OF ARTERIOVENOUS FISTULA Left 10/28/2015   Procedure: REVISON OF ARTERIOVENOUS FISTULA WITH ARTEGRAFT;  Surgeon: Katha Cabal, MD;  Location: [facility] ORS;  Service: Vascular;  Laterality: Left;  . WOUND DEBRIDEMENT Left 10/28/2015   Procedure: Resection of shoulder cyst ( left );  Surgeon: Katha Cabal, MD;  Location: [facility] ORS;  Service: Vascular;  Laterality: Left;    Prior to Admission medications   Medication Sig Start Date End Date Taking? Authorizing Provider  ALPRAZolam Duanne Moron) 0.5 MG tablet Take 0.5 mg by mouth 2 (two) times daily.   Yes [provider]  amLODipine (NORVASC) 10 MG tablet Take 10 mg by mouth daily.    Yes [provider]  calcium acetate (PHOSLO) 667 MG capsule Take 3 capsules (2,001 mg total) by mouth 3 (three) times daily with meals. 06/20/17  Yes Wieting, Richard, MD  carvedilol (COREG) 12.5 MG tablet Take 12.5 mg by mouth 2 (two) times daily with a meal.   Yes [provider]  cinacalcet (SENSIPAR) 30 MG tablet Take 60 mg by mouth daily.    Yes [provider]  ezetimibe (ZETIA) 10 MG tablet Take 10 mg by mouth daily.    Yes [provider]  furosemide (LASIX) 40 MG tablet Take 40 mg by mouth daily.    Yes [provider]   losartan (COZAAR) 100 MG tablet Take 100 mg by mouth daily.    Yes [provider]  multivitamin (RENA-VIT) TABS tablet Take 1 tablet by mouth at bedtime.    Yes [provider]  pantoprazole (PROTONIX) 40 MG tablet Take 40 mg by mouth 2 (two) times daily.   Yes [provider]  sennosides-docusate sodium (SENOKOT-S) 8.6-50 MG tablet Take 1 tablet by mouth at bedtime as needed for constipation.   Yes [provider]  sertraline (ZOLOFT) 50 MG tablet Take 50 mg by mouth daily.    Yes [provider]  albuterol (PROVENTIL HFA;VENTOLIN HFA) 108 (90 Base) MCG/ACT inhaler Inhale 2 puffs into the lungs every 6 (six) hours as needed for wheezing or shortness of breath. 12/11/16   Gouru, Illene Silver, MD  Amino Acids-Protein Hydrolys (FEEDING SUPPLEMENT, PRO-STAT SUGAR FREE 64,) LIQD Take 30 mLs by mouth daily. 12/12/16   Gouru, Illene Silver, MD  fluticasone (FLONASE) 50 MCG/ACT nasal spray Place 2 sprays into both nostrils daily as needed for rhinitis.    [provider]  hydrALAZINE (APRESOLINE) 50 MG tablet Take 1 tablet (50 mg total) by mouth 3 (three) times daily. Patient not taking: Reported on 09/11/2017 08/13/17   Bettey Costa, MD  hydrochlorothiazide (HYDRODIURIL) 25 MG tablet Take 1 tablet (25 mg total) by mouth daily. 09/11/17   Rukaya Kleinschmidt, Dannielle Karvonen, PA-C  ipratropium-albuterol (DUONEB) 0.5-2.5 (3) MG/3ML SOLN Take 3 mLs by nebulization every 6 (six) hours as needed. 12/11/16   Nicholes Mango, MD  lidocaine-prilocaine (EMLA) cream Apply 1 application topically as needed (prior to accessing port).    [provider]  ondansetron (ZOFRAN) 4 MG tablet Take 1 tablet (4 mg total) by mouth every 8 (eight) hours as needed for nausea or vomiting. 03/24/17   Lisa Roca, MD  traMADol (ULTRAM) 50 MG tablet Take 1 tablet (50 mg total) by mouth every 12 (twelve) hours as needed for severe pain. 08/04/17 08/04/18  Arta Silence, MD    Allergies Sulfa  antibiotics  Family History  Problem Relation Age of Onset  . Heart disease Mother   . Cancer Father   . Cancer Sister   . Breast cancer Sister     Social History Social History  Substance Use Topics  . Smoking status: Former Smoker    Years: 40.00    Types: Cigarettes    Quit date: 07/28/2017  . Smokeless tobacco: Never Used  . Alcohol use No    Review of Systems  Constitutional: Negative for fever. Cardiovascular: Negative for chest pain. Respiratory: Negative for shortness of breath. Musculoskeletal: Negative for back pain. Right leg swelling as above. Skin: Negative for rash. Neurological: Negative for headaches, focal weakness or numbness. ____________________________________________  PHYSICAL EXAM:  VITAL SIGNS: ED Triage Vitals  Enc Vitals Group     BP 09/11/17 1136 (!) 158/74     Pulse Rate 09/11/17 1136 62     Resp 09/11/17 1136 18     Temp 09/11/17 1136 97.8 F (36.6 C)     Temp Source 09/11/17 1136 Oral     SpO2 09/11/17 1136 99 %     Weight 09/11/17 1135 128 lb (58.1 kg)     Height 09/11/17 1135 5\' 5"  (1.651 m)     Head Circumference --      Peak Flow --      Pain Score 09/11/17 1134 7     Pain Loc --      Pain Edu? --      Excl. in [place]? --     Constitutional: Alert and oriented. Well appearing and in no distress. Head: Normocephalic and atraumatic. Cardiovascular: Normal rate, regular rhythm. Normal distal pulses and cap refill.  2+ pitting edema from the knee to the foot, on the left. Respiratory: Normal respiratory effort. No wheezes/rales/rhonchi. Gastrointestinal: Soft and nontender. No distention. Musculoskeletal: right LE with obvious, moderated edema. Pitting edema without tenderness noted from the knee to the foot. Nontender with normal range of motion in all extremities.  Neurologic:  Normal speech and language. No gross focal neurologic deficits are appreciated. Skin:  Skin is warm, dry and intact. No rash, bruise, ecchymosis, erythema,  ulcerations or hematoma noted. ____________________________________________   RADIOLOGY  RLE Ultrasound/Doppler   IMPRESSION: No evidence of RIGHT lower extremity deep venous thrombosis. ____________________________________________  PROCEDURES  HCTZ 25 mg PO ____________________________________________  INITIAL IMPRESSION / ASSESSMENT AND PLAN / ED COURSE  Patient with ED evaluation of a one-week complaint of right leg swelling and edema. Patient also has some complaints of paresthesias that she describes as "pins and needles." Exam is overall benign today with the exception of some 2+ pitting edema to the left lower extremity. No skin temperature or color changes are reported. Patient is reassured  by her negative ultrasound study. She is discharged follow-up with primary care provider for ongoing symptoms. She is given instructions on management of peripheral edema at this time. Prescription for HCTZ splattered at patient request. She will follow up with primary care provider or vascular surgeon as discussed. Return to the ED as needed. ____________________________________________  FINAL CLINICAL IMPRESSION(S) / ED DIAGNOSES  Final diagnoses:  Edema leg      Carmie End, Dannielle Karvonen, PA-C 09/11/17 1849    Carrie Mew, MD 09/12/17 2036

## 2017-09-11 NOTE — Discharge Instructions (Signed)
Your exam and ultrasound do not reveal a DVT (clot) in your right leg. It is unclear of the cause of your right leg edema. Rest with the leg elevated when seated. Follow-up with Dr. Rosario Jacks for ongoing symptoms. Take the fluid pill as directed. Return to the ED as needed.

## 2017-09-24 ENCOUNTER — Ambulatory Visit: Payer: Medicare Other | Attending: Family | Admitting: Family

## 2017-09-24 VITALS — BP 195/87 | HR 59 | Resp 18 | Ht 65.0 in | Wt 129.1 lb

## 2017-09-24 DIAGNOSIS — E669 Obesity, unspecified: Secondary | ICD-10-CM | POA: Insufficient documentation

## 2017-09-24 DIAGNOSIS — Z803 Family history of malignant neoplasm of breast: Secondary | ICD-10-CM | POA: Diagnosis not present

## 2017-09-24 DIAGNOSIS — N186 End stage renal disease: Secondary | ICD-10-CM | POA: Diagnosis not present

## 2017-09-24 DIAGNOSIS — Z8249 Family history of ischemic heart disease and other diseases of the circulatory system: Secondary | ICD-10-CM | POA: Diagnosis not present

## 2017-09-24 DIAGNOSIS — Z79899 Other long term (current) drug therapy: Secondary | ICD-10-CM | POA: Diagnosis not present

## 2017-09-24 DIAGNOSIS — Z90710 Acquired absence of both cervix and uterus: Secondary | ICD-10-CM | POA: Diagnosis not present

## 2017-09-24 DIAGNOSIS — E875 Hyperkalemia: Secondary | ICD-10-CM | POA: Insufficient documentation

## 2017-09-24 DIAGNOSIS — E785 Hyperlipidemia, unspecified: Secondary | ICD-10-CM | POA: Diagnosis not present

## 2017-09-24 DIAGNOSIS — I5032 Chronic diastolic (congestive) heart failure: Secondary | ICD-10-CM | POA: Diagnosis present

## 2017-09-24 DIAGNOSIS — Z809 Family history of malignant neoplasm, unspecified: Secondary | ICD-10-CM | POA: Insufficient documentation

## 2017-09-24 DIAGNOSIS — Z882 Allergy status to sulfonamides status: Secondary | ICD-10-CM | POA: Diagnosis not present

## 2017-09-24 DIAGNOSIS — E1122 Type 2 diabetes mellitus with diabetic chronic kidney disease: Secondary | ICD-10-CM | POA: Insufficient documentation

## 2017-09-24 DIAGNOSIS — Z9889 Other specified postprocedural states: Secondary | ICD-10-CM | POA: Diagnosis not present

## 2017-09-24 DIAGNOSIS — J449 Chronic obstructive pulmonary disease, unspecified: Secondary | ICD-10-CM | POA: Diagnosis not present

## 2017-09-24 DIAGNOSIS — K219 Gastro-esophageal reflux disease without esophagitis: Secondary | ICD-10-CM | POA: Diagnosis not present

## 2017-09-24 DIAGNOSIS — Z87891 Personal history of nicotine dependence: Secondary | ICD-10-CM | POA: Insufficient documentation

## 2017-09-24 DIAGNOSIS — G4733 Obstructive sleep apnea (adult) (pediatric): Secondary | ICD-10-CM | POA: Insufficient documentation

## 2017-09-24 DIAGNOSIS — Z992 Dependence on renal dialysis: Secondary | ICD-10-CM | POA: Diagnosis not present

## 2017-09-24 DIAGNOSIS — I132 Hypertensive heart and chronic kidney disease with heart failure and with stage 5 chronic kidney disease, or end stage renal disease: Secondary | ICD-10-CM | POA: Diagnosis not present

## 2017-09-24 DIAGNOSIS — I10 Essential (primary) hypertension: Secondary | ICD-10-CM

## 2017-09-24 NOTE — Patient Instructions (Signed)
Continue weighing daily and call for an overnight weight gain of > 2 pounds or a weekly weight gain of >5 pounds. 

## 2017-09-24 NOTE — Progress Notes (Signed)
Patient ID: Michele Nash, female    DOB: August 14, 1956, 61 y.o.   MRN: [id number]  HPI  Michele Nash is a 61 y/o female with a history of DM, hyperlipidemia, HTN, CKD on dialysis, COPD, depression, GERD, anemia, obstructive sleep apnea, GIB, recent tobacco use and chronic heart failure.   Reviewed echo report from 04/25/16 which showed an EF of 70% along with trivial MR/TR.   Was in the ED 09/11/17 due to pain/swelling in the right leg/foot. She was treated and released. Admitted 08/11/17 due to pulmonary edema from underlying renal disease. Initially needed bipap and then weaned to her baseline 4L of oxygen. Dialysis for ESRD. Discharged home after 2 days. Was in the ED 08/04/17 due to left leg pain. Evaluated and released home. Admitted 06/15/17 due to uncontrolled HTN and HF. Medications were adjusted. Discharged after 5 days with antibiotics. Was in the ED 06/05/17 due to chest pain. Was treated and released. Admitted 05/21/17 due to hyperkalemia. Treated with kayexalate and hemodialysis and was discharged the next day.   She presents today for her follow-up visit with a chief complaint of mild fatigue upon moderate exertion. She describes this as chronic in nature having been present for several years with varying levels of severity. She has associated shortness of breath and difficulty sleeping along with this. She denies any chest pain, edema, palpitations, dizziness or weight gain.   Past Medical History:  Diagnosis Date  . Altered mental status   . Anemia   . Apnea, sleep    for sleep study 10/17/15-no cpap yet  . Arthritis   . Bronchitis   . CHF (congestive heart failure) ([place])   . Chronic kidney disease   . COPD (chronic obstructive pulmonary disease) ([place])   . Depression   . Dialysis patient ([place]) 2014  . GERD (gastroesophageal reflux disease)   . GIB (gastrointestinal bleeding) 07/09/2016  . Headache   . Hyperlipidemia   . Hypertension   . Obesity   . Pulmonary edema 09/14/2016  .  Renal dialysis device, implant, or graft complication    RIGHT CHEST CATH  . Respiratory failure ([place]) 07/23/2016  . Traumatic hematoma of forehead   . Type II diabetes mellitus ([place]) 09/21/2009   diet controlled   Past Surgical History:  Procedure Laterality Date  . ABDOMINAL HYSTERECTOMY    . COLONOSCOPY  2011  . COLONOSCOPY WITH PROPOFOL N/A 09/20/2015   Procedure: COLONOSCOPY WITH PROPOFOL;  Surgeon: Lucilla Lame, MD;  Location: [facility] ENDOSCOPY;  Service: Endoscopy;  Laterality: N/A;  . DIALYSIS FISTULA CREATION    . ESOPHAGOGASTRODUODENOSCOPY N/A 07/13/2016   Procedure: ESOPHAGOGASTRODUODENOSCOPY (EGD);  Surgeon: Lollie Sails, MD;  Location: [facility] ENDOSCOPY;  Service: Endoscopy;  Laterality: N/A;  . ESOPHAGOGASTRODUODENOSCOPY (EGD) WITH PROPOFOL N/A 06/02/2016   Procedure: ESOPHAGOGASTRODUODENOSCOPY (EGD) WITH PROPOFOL;  Surgeon: Manya Silvas, MD;  Location: [facility] ENDOSCOPY;  Service: Endoscopy;  Laterality: N/A;  . LIGATION OF ARTERIOVENOUS  FISTULA Left 05/24/2017   Procedure: LIGATION OF ARTERIOVENOUS  FISTULA ( PERMCATH INSERTION );  Surgeon: Katha Cabal, MD;  Location: [facility] ORS;  Service: Vascular;  Laterality: Left;  . PERIPHERAL VASCULAR CATHETERIZATION N/A 05/10/2015   Procedure: A/V Shuntogram/Fistulagram;  Surgeon: Katha Cabal, MD;  Location: [place] CV LAB;  Service: Cardiovascular;  Laterality: N/A;  . PERIPHERAL VASCULAR CATHETERIZATION Left 05/10/2015   Procedure: A/V Shunt Intervention;  Surgeon: Katha Cabal, MD;  Location: [place] CV LAB;  Service: Cardiovascular;  Laterality: Left;  . PERIPHERAL VASCULAR  CATHETERIZATION Left 08/23/2015   Procedure: A/V Shuntogram/Fistulagram;  Surgeon: Katha Cabal, MD;  Location: [place] CV LAB;  Service: Cardiovascular;  Laterality: Left;  . PERIPHERAL VASCULAR CATHETERIZATION N/A 08/23/2015   Procedure: A/V Shunt Intervention;  Surgeon: Katha Cabal, MD;  Location: [place] CV LAB;   Service: Cardiovascular;  Laterality: N/A;  . PERIPHERAL VASCULAR CATHETERIZATION  08/23/2015   Procedure: Dialysis/Perma Catheter Insertion;  Surgeon: Katha Cabal, MD;  Location: [place] CV LAB;  Service: Cardiovascular;;  . PERIPHERAL VASCULAR CATHETERIZATION N/A 01/03/2016   Procedure: Dialysis/Perma Catheter Removal;  Surgeon: Katha Cabal, MD;  Location: [place] CV LAB;  Service: Cardiovascular;  Laterality: N/A;  . REVISON OF ARTERIOVENOUS FISTULA Left 10/28/2015   Procedure: REVISON OF ARTERIOVENOUS FISTULA WITH ARTEGRAFT;  Surgeon: Katha Cabal, MD;  Location: [facility] ORS;  Service: Vascular;  Laterality: Left;  . WOUND DEBRIDEMENT Left 10/28/2015   Procedure: Resection of shoulder cyst ( left );  Surgeon: Katha Cabal, MD;  Location: [facility] ORS;  Service: Vascular;  Laterality: Left;   Family History  Problem Relation Age of Onset  . Heart disease Mother   . Cancer Father   . Cancer Sister   . Breast cancer Sister    Social History  Substance Use Topics  . Smoking status: Former Smoker    Years: 40.00    Types: Cigarettes    Quit date: 07/28/2017  . Smokeless tobacco: Never Used  . Alcohol use No   Allergies  Allergen Reactions  . Sulfa Antibiotics Itching, Swelling, Rash and Other (See Comments)    Reaction:  Facial/body swelling    Prior to Admission medications   Medication Sig Start Date End Date Taking? Authorizing Provider  albuterol (PROVENTIL HFA;VENTOLIN HFA) 108 (90 Base) MCG/ACT inhaler Inhale 2 puffs into the lungs every 6 (six) hours as needed for wheezing or shortness of breath. 12/11/16  Yes Gouru, Illene Silver, MD  ALPRAZolam Duanne Moron) 0.5 MG tablet Take 0.5 mg by mouth 2 (two) times daily.   Yes [provider]  Amino Acids-Protein Hydrolys (FEEDING SUPPLEMENT, PRO-STAT SUGAR FREE 64,) LIQD Take 30 mLs by mouth daily. 12/12/16  Yes Gouru, Illene Silver, MD  amLODipine (NORVASC) 10 MG tablet Take 10 mg by mouth daily.    Yes [provider]  calcium acetate (PHOSLO) 667 MG capsule Take 3 capsules (2,001 mg total) by mouth 3 (three) times daily with meals. 06/20/17  Yes Wieting, Richard, MD  carvedilol (COREG) 12.5 MG tablet Take 12.5 mg by mouth 2 (two) times daily with a meal.   Yes [provider]  cinacalcet (SENSIPAR) 30 MG tablet Take 60 mg by mouth daily.    Yes [provider]  ezetimibe (ZETIA) 10 MG tablet Take 10 mg by mouth daily.    Yes [provider]  fluticasone (FLONASE) 50 MCG/ACT nasal spray Place 2 sprays into both nostrils daily as needed for rhinitis.   Yes [provider]  furosemide (LASIX) 40 MG tablet Take 40 mg by mouth daily.    Yes [provider]  hydrALAZINE (APRESOLINE) 50 MG tablet Take 1 tablet (50 mg total) by mouth 3 (three) times daily. 08/13/17  Yes Mody, Ulice Bold, MD  hydrochlorothiazide (HYDRODIURIL) 25 MG tablet Take 1 tablet (25 mg total) by mouth daily. 09/11/17  Yes Menshew, Dannielle Karvonen, PA-C  ipratropium-albuterol (DUONEB) 0.5-2.5 (3) MG/3ML SOLN Take 3 mLs by nebulization every 6 (six) hours as needed. 12/11/16  Yes Nicholes Mango, MD  lidocaine-prilocaine (  EMLA) cream Apply 1 application topically as needed (prior to accessing port).   Yes [provider]  losartan (COZAAR) 100 MG tablet Take 100 mg by mouth daily.    Yes [provider]  multivitamin (RENA-VIT) TABS tablet Take 1 tablet by mouth at bedtime.    Yes [provider]  ondansetron (ZOFRAN) 4 MG tablet Take 1 tablet (4 mg total) by mouth every 8 (eight) hours as needed for nausea or vomiting. 03/24/17  Yes Lisa Roca, MD  pantoprazole (PROTONIX) 40 MG tablet Take 40 mg by mouth 2 (two) times daily.   Yes [provider]  sennosides-docusate sodium (SENOKOT-S) 8.6-50 MG tablet Take 1 tablet by mouth at bedtime as needed for constipation.   Yes [provider]  sertraline (ZOLOFT) 50 MG tablet Take 50 mg by mouth daily.    Yes  [provider]  traMADol (ULTRAM) 50 MG tablet Take 1 tablet (50 mg total) by mouth every 12 (twelve) hours as needed for severe pain. 08/04/17 08/04/18 Yes Arta Silence, MD   Review of Systems  Constitutional: Positive for fatigue. Negative for appetite change.  HENT: Positive for congestion and postnasal drip. Negative for ear pain and sore throat.   Eyes: Negative.   Respiratory: Positive for shortness of breath. Negative for chest tightness.   Cardiovascular: Negative for chest pain, palpitations and leg swelling.  Gastrointestinal: Negative for abdominal distention and abdominal pain.  Endocrine: Negative.   Genitourinary: Negative.   Musculoskeletal: Negative for back pain and neck pain.  Skin: Negative.   Allergic/Immunologic: Negative.   Neurological: Negative for dizziness and light-headedness.  Hematological: Negative for adenopathy. Does not bruise/bleed easily.  Psychiatric/Behavioral: Positive for sleep disturbance (waking up frequently; wearing CPAP and oxygen). Negative for dysphoric mood. The patient is not nervous/anxious.    Vitals:   09/24/17 1156  BP: (!) 195/87  Pulse: (!) 59  Resp: 18  SpO2: 96%  Weight: 129 lb 2 oz (58.6 kg)  Height: 5\' 5"  (1.651 m)   Wt Readings from Last 3 Encounters:  09/24/17 129 lb 2 oz (58.6 kg)  09/11/17 128 lb (58.1 kg)  08/27/17 128 lb (58.1 kg)    Lab Results  Component Value Date   CREATININE 3.46 (H) 08/13/2017   CREATININE 4.00 (H) 08/12/2017   CREATININE 8.07 (H) 08/11/2017   Physical Exam  Constitutional: She is oriented to person, place, and time. She appears well-developed and well-nourished.  HENT:  Head: Normocephalic and atraumatic.  Neck: Normal range of motion. Neck supple. No JVD present.  Cardiovascular: Normal rate and regular rhythm.   Pulmonary/Chest: Effort normal. She has no wheezes. She has no rales.  Abdominal: Soft. She exhibits no distension. There is no tenderness.  Musculoskeletal:  She exhibits no edema or tenderness.  Neurological: She is alert and oriented to person, place, and time.  Skin: Skin is warm and dry.  Psychiatric: She has a normal mood and affect. Her behavior is normal. Thought content normal.  Nursing note and vitals reviewed.  Assessment & Plan:  1:Chronic heart failure with preserved ejection fraction- - NYHA class II - euvolemic - weighing daily. Reminded to call for an overnight weight gain of >2 pounds or a weekly weight gain of >5 pounds - does add salt to her foods at times; discussed the importance of closely following a 2000mg  sodium diet  - trying to remain active and does allow for frequent rest periods - BMP from 08/13/17 reviewed and showed sodium 138, potassium 3.9  and GFR 15.  - patient reports receiving her flu vaccine for this season   2: HTN- - BP elevated today but she hasn't taken her medications yet; will do so as soon as she returns home - follows with PCP Rosario Jacks) about her BP  3: ESRD on dialysis- - received dialysis on M, W, F - has left upper arm fistula that is enlarged and patient says is tender when it's accessed; + thrill present - she is using a port-a-cath for her dialysis until they can figure out what is wrong with the fistula  Patient did not bring her medications nor a list. Each medication was verbally reviewed with the patient and she was encouraged to bring the bottles to every visit to confirm accuracy of list.  Return in 6 months or sooner for any questions/problems before then.

## 2017-09-25 ENCOUNTER — Encounter: Payer: Self-pay | Admitting: Family

## 2017-10-05 ENCOUNTER — Other Ambulatory Visit: Payer: Self-pay

## 2017-10-05 ENCOUNTER — Inpatient Hospital Stay
Admission: EM | Admit: 2017-10-05 | Discharge: 2017-10-07 | DRG: 291 | Disposition: A | Discharge status: Home / Self Care | Payer: Medicare Other | Attending: Internal Medicine | Admitting: Internal Medicine

## 2017-10-05 ENCOUNTER — Emergency Department: Payer: Medicare Other

## 2017-10-05 DIAGNOSIS — Z8249 Family history of ischemic heart disease and other diseases of the circulatory system: Secondary | ICD-10-CM | POA: Diagnosis not present

## 2017-10-05 DIAGNOSIS — J9602 Acute respiratory failure with hypercapnia: Secondary | ICD-10-CM

## 2017-10-05 DIAGNOSIS — I161 Hypertensive emergency: Secondary | ICD-10-CM | POA: Diagnosis present

## 2017-10-05 DIAGNOSIS — I5033 Acute on chronic diastolic (congestive) heart failure: Secondary | ICD-10-CM | POA: Diagnosis present

## 2017-10-05 DIAGNOSIS — J441 Chronic obstructive pulmonary disease with (acute) exacerbation: Secondary | ICD-10-CM | POA: Diagnosis present

## 2017-10-05 DIAGNOSIS — I132 Hypertensive heart and chronic kidney disease with heart failure and with stage 5 chronic kidney disease, or end stage renal disease: Principal | ICD-10-CM | POA: Diagnosis present

## 2017-10-05 DIAGNOSIS — E875 Hyperkalemia: Secondary | ICD-10-CM | POA: Diagnosis present

## 2017-10-05 DIAGNOSIS — J9621 Acute and chronic respiratory failure with hypoxia: Secondary | ICD-10-CM | POA: Diagnosis present

## 2017-10-05 DIAGNOSIS — Z87891 Personal history of nicotine dependence: Secondary | ICD-10-CM

## 2017-10-05 DIAGNOSIS — F411 Generalized anxiety disorder: Secondary | ICD-10-CM | POA: Diagnosis present

## 2017-10-05 DIAGNOSIS — G4733 Obstructive sleep apnea (adult) (pediatric): Secondary | ICD-10-CM | POA: Diagnosis present

## 2017-10-05 DIAGNOSIS — E872 Acidosis: Secondary | ICD-10-CM | POA: Diagnosis present

## 2017-10-05 DIAGNOSIS — K219 Gastro-esophageal reflux disease without esophagitis: Secondary | ICD-10-CM | POA: Diagnosis present

## 2017-10-05 DIAGNOSIS — E1151 Type 2 diabetes mellitus with diabetic peripheral angiopathy without gangrene: Secondary | ICD-10-CM | POA: Diagnosis present

## 2017-10-05 DIAGNOSIS — Z992 Dependence on renal dialysis: Secondary | ICD-10-CM

## 2017-10-05 DIAGNOSIS — J9622 Acute and chronic respiratory failure with hypercapnia: Secondary | ICD-10-CM | POA: Diagnosis present

## 2017-10-05 DIAGNOSIS — J9601 Acute respiratory failure with hypoxia: Secondary | ICD-10-CM | POA: Diagnosis not present

## 2017-10-05 DIAGNOSIS — N2581 Secondary hyperparathyroidism of renal origin: Secondary | ICD-10-CM | POA: Diagnosis present

## 2017-10-05 DIAGNOSIS — N186 End stage renal disease: Secondary | ICD-10-CM | POA: Diagnosis present

## 2017-10-05 DIAGNOSIS — J9600 Acute respiratory failure, unspecified whether with hypoxia or hypercapnia: Secondary | ICD-10-CM | POA: Diagnosis present

## 2017-10-05 DIAGNOSIS — E1122 Type 2 diabetes mellitus with diabetic chronic kidney disease: Secondary | ICD-10-CM | POA: Diagnosis present

## 2017-10-05 DIAGNOSIS — D631 Anemia in chronic kidney disease: Secondary | ICD-10-CM | POA: Diagnosis present

## 2017-10-05 DIAGNOSIS — E785 Hyperlipidemia, unspecified: Secondary | ICD-10-CM | POA: Diagnosis present

## 2017-10-05 DIAGNOSIS — R0603 Acute respiratory distress: Secondary | ICD-10-CM | POA: Diagnosis present

## 2017-10-05 DIAGNOSIS — J810 Acute pulmonary edema: Secondary | ICD-10-CM

## 2017-10-05 LAB — BLOOD GAS, VENOUS
Acid-base deficit: 0.2 mmol/L (ref 0.0–2.0)
Bicarbonate: 30.9 mmol/L — ABNORMAL HIGH (ref 20.0–28.0)
DELIVERY SYSTEMS: POSITIVE
PATIENT TEMPERATURE: 37
pCO2, Ven: 95 mmHg (ref 44.0–60.0)
pH, Ven: 7.12 — CL (ref 7.250–7.430)

## 2017-10-05 LAB — CBC WITH DIFFERENTIAL/PLATELET
BASOS ABS: 0.1 10*3/uL (ref 0–0.1)
Basophils Relative: 1 %
EOS ABS: 0.4 10*3/uL (ref 0–0.7)
EOS PCT: 3 %
HCT: 37.6 % (ref 35.0–47.0)
Hemoglobin: 11.3 g/dL — ABNORMAL LOW (ref 12.0–16.0)
LYMPHS ABS: 2.8 10*3/uL (ref 1.0–3.6)
LYMPHS PCT: 24 %
MCH: 31.9 pg (ref 26.0–34.0)
MCHC: 30.1 g/dL — AB (ref 32.0–36.0)
MCV: 106 fL — ABNORMAL HIGH (ref 80.0–100.0)
MONO ABS: 1 10*3/uL — AB (ref 0.2–0.9)
Monocytes Relative: 9 %
Neutro Abs: 7.4 10*3/uL — ABNORMAL HIGH (ref 1.4–6.5)
Neutrophils Relative %: 63 %
PLATELETS: 294 10*3/uL (ref 150–440)
RBC: 3.55 MIL/uL — ABNORMAL LOW (ref 3.80–5.20)
RDW: 15.6 % — AB (ref 11.5–14.5)
WBC: 11.8 10*3/uL — ABNORMAL HIGH (ref 3.6–11.0)

## 2017-10-05 LAB — BASIC METABOLIC PANEL
Anion gap: 12 (ref 5–15)
Anion gap: 12 (ref 5–15)
BUN: 55 mg/dL — AB (ref 6–20)
BUN: 59 mg/dL — AB (ref 6–20)
CALCIUM: 8.1 mg/dL — AB (ref 8.9–10.3)
CHLORIDE: 103 mmol/L (ref 101–111)
CO2: 23 mmol/L (ref 22–32)
CO2: 26 mmol/L (ref 22–32)
CREATININE: 6.72 mg/dL — AB (ref 0.44–1.00)
Calcium: 8.4 mg/dL — ABNORMAL LOW (ref 8.9–10.3)
Chloride: 100 mmol/L — ABNORMAL LOW (ref 101–111)
Creatinine, Ser: 6.76 mg/dL — ABNORMAL HIGH (ref 0.44–1.00)
GFR calc Af Amer: 7 mL/min — ABNORMAL LOW (ref >60)
GFR calc Af Amer: 7 mL/min — ABNORMAL LOW (ref >60)
GFR calc non Af Amer: 6 mL/min — ABNORMAL LOW (ref >60)
GFR, EST NON AFRICAN AMERICAN: 6 mL/min — AB (ref >60)
GLUCOSE: 228 mg/dL — AB (ref 65–99)
GLUCOSE: 223 mg/dL — AB (ref 65–99)
POTASSIUM: 7.4 mmol/L — AB (ref 3.5–5.1)
POTASSIUM: 7 mmol/L — AB (ref 3.5–5.1)
SODIUM: 138 mmol/L (ref 135–145)
Sodium: 138 mmol/L (ref 135–145)

## 2017-10-05 LAB — TROPONIN I: Troponin I: 0.05 ng/mL (ref <0.03)

## 2017-10-05 LAB — GLUCOSE, CAPILLARY: Glucose-Capillary: 103 mg/dL — ABNORMAL HIGH (ref 65–99)

## 2017-10-05 LAB — BRAIN NATRIURETIC PEPTIDE: B NATRIURETIC PEPTIDE 5: 350 pg/mL — AB (ref 0.0–100.0)

## 2017-10-05 MED ORDER — INSULIN ASPART 100 UNIT/ML ~~LOC~~ SOLN
8.0000 [IU] | Freq: Once | SUBCUTANEOUS | Status: AC
  Administered 2017-10-05: 8 [IU] via INTRAVENOUS
  Filled 2017-10-05: qty 1

## 2017-10-05 MED ORDER — SODIUM CHLORIDE 0.9% FLUSH
3.0000 mL | Freq: Two times a day (BID) | INTRAVENOUS | Status: DC
  Administered 2017-10-06 (×2): 3 mL via INTRAVENOUS

## 2017-10-05 MED ORDER — MAGNESIUM SULFATE 2 GM/50ML IV SOLN
2.0000 g | Freq: Once | INTRAVENOUS | Status: AC
  Administered 2017-10-05: 2 g via INTRAVENOUS

## 2017-10-05 MED ORDER — METOPROLOL TARTRATE 5 MG/5ML IV SOLN: 2.5000 mg | Freq: Four times a day (QID) | INTRAVENOUS | Status: DC | PRN

## 2017-10-05 MED ORDER — SODIUM CHLORIDE 0.9% FLUSH: 3.0000 mL | INTRAVENOUS | Status: DC | PRN

## 2017-10-05 MED ORDER — SODIUM CHLORIDE 0.9 % IV SOLN
1.0000 g | Freq: Once | INTRAVENOUS | Status: AC
  Administered 2017-10-05: 1 g via INTRAVENOUS
  Filled 2017-10-05: qty 10

## 2017-10-05 MED ORDER — ONDANSETRON HCL 4 MG/2ML IJ SOLN: 4.0000 mg | Freq: Four times a day (QID) | INTRAMUSCULAR | Status: DC | PRN

## 2017-10-05 MED ORDER — HYDROCODONE-ACETAMINOPHEN 5-325 MG PO TABS: 1.0000 | ORAL_TABLET | ORAL | Status: DC | PRN

## 2017-10-05 MED ORDER — INSULIN ASPART 100 UNIT/ML ~~LOC~~ SOLN
0.0000 [IU] | Freq: Three times a day (TID) | SUBCUTANEOUS | Status: DC
  Administered 2017-10-06: 15 [IU] via SUBCUTANEOUS
  Administered 2017-10-06: 8 [IU] via SUBCUTANEOUS
  Administered 2017-10-07 (×2): 3 [IU] via SUBCUTANEOUS
  Filled 2017-10-05 (×5): qty 1

## 2017-10-05 MED ORDER — SODIUM CHLORIDE 0.9% FLUSH
3.0000 mL | Freq: Two times a day (BID) | INTRAVENOUS | Status: DC
  Administered 2017-10-06: 3 mL via INTRAVENOUS

## 2017-10-05 MED ORDER — BUDESONIDE 0.5 MG/2ML IN SUSP
0.5000 mg | Freq: Two times a day (BID) | RESPIRATORY_TRACT | Status: DC
  Administered 2017-10-05 – 2017-10-07 (×4): 0.5 mg via RESPIRATORY_TRACT
  Filled 2017-10-05 (×4): qty 2

## 2017-10-05 MED ORDER — SODIUM CHLORIDE 0.9 % IV SOLN: 250.0000 mL | INTRAVENOUS | Status: DC | PRN

## 2017-10-05 MED ORDER — INSULIN ASPART 100 UNIT/ML ~~LOC~~ SOLN: 0.0000 [IU] | Freq: Every day | SUBCUTANEOUS | Status: DC

## 2017-10-05 MED ORDER — FUROSEMIDE 10 MG/ML IJ SOLN
40.0000 mg | Freq: Three times a day (TID) | INTRAMUSCULAR | Status: DC
  Administered 2017-10-05 – 2017-10-06 (×2): 40 mg via INTRAVENOUS
  Filled 2017-10-05: qty 4

## 2017-10-05 MED ORDER — NICOTINE 14 MG/24HR TD PT24
14.0000 mg | MEDICATED_PATCH | Freq: Every day | TRANSDERMAL | Status: DC
  Administered 2017-10-06 – 2017-10-07 (×2): 14 mg via TRANSDERMAL
  Filled 2017-10-05 (×2): qty 1

## 2017-10-05 MED ORDER — PANTOPRAZOLE SODIUM 40 MG IV SOLR
40.0000 mg | INTRAVENOUS | Status: DC
  Administered 2017-10-05 – 2017-10-06 (×2): 40 mg via INTRAVENOUS
  Filled 2017-10-05 (×2): qty 40

## 2017-10-05 MED ORDER — FUROSEMIDE 10 MG/ML IJ SOLN
INTRAMUSCULAR | Status: AC
  Filled 2017-10-05: qty 4

## 2017-10-05 MED ORDER — ACETAMINOPHEN 650 MG RE SUPP: 650.0000 mg | Freq: Four times a day (QID) | RECTAL | Status: DC | PRN

## 2017-10-05 MED ORDER — IPRATROPIUM-ALBUTEROL 0.5-2.5 (3) MG/3ML IN SOLN
3.0000 mL | Freq: Once | RESPIRATORY_TRACT | Status: AC
  Administered 2017-10-05: 3 mL via RESPIRATORY_TRACT

## 2017-10-05 MED ORDER — ACETAMINOPHEN 325 MG PO TABS
650.0000 mg | ORAL_TABLET | Freq: Four times a day (QID) | ORAL | Status: DC | PRN
  Administered 2017-10-06: 650 mg via ORAL
  Filled 2017-10-05: qty 2

## 2017-10-05 MED ORDER — POLYETHYLENE GLYCOL 3350 17 G PO PACK: 17.0000 g | PACK | Freq: Every day | ORAL | Status: DC | PRN

## 2017-10-05 MED ORDER — HEPARIN SODIUM (PORCINE) 5000 UNIT/ML IJ SOLN
5000.0000 [IU] | Freq: Three times a day (TID) | INTRAMUSCULAR | Status: DC
  Administered 2017-10-06 – 2017-10-07 (×3): 5000 [IU] via SUBCUTANEOUS
  Filled 2017-10-05 (×4): qty 1

## 2017-10-05 MED ORDER — DEXTROSE 50 % IV SOLN
1.0000 | Freq: Once | INTRAVENOUS | Status: AC
  Administered 2017-10-05: 50 mL via INTRAVENOUS
  Filled 2017-10-05: qty 50

## 2017-10-05 MED ORDER — IPRATROPIUM-ALBUTEROL 0.5-2.5 (3) MG/3ML IN SOLN: 3.0000 mL | RESPIRATORY_TRACT | Status: DC | PRN

## 2017-10-05 MED ORDER — METHYLPREDNISOLONE SODIUM SUCC 125 MG IJ SOLR
60.0000 mg | Freq: Four times a day (QID) | INTRAMUSCULAR | Status: DC
  Administered 2017-10-06 (×3): 60 mg via INTRAVENOUS
  Filled 2017-10-05 (×3): qty 2

## 2017-10-05 MED ORDER — NITROGLYCERIN IN D5W 200-5 MCG/ML-% IV SOLN
INTRAVENOUS | Status: AC
  Administered 2017-10-05: 100 ug/min via INTRAVENOUS
  Filled 2017-10-05: qty 250

## 2017-10-05 MED ORDER — NITROGLYCERIN IN D5W 200-5 MCG/ML-% IV SOLN
0.0000 ug/min | Freq: Once | INTRAVENOUS | Status: AC
  Administered 2017-10-05: 100 ug/min via INTRAVENOUS

## 2017-10-05 MED ORDER — ONDANSETRON HCL 4 MG PO TABS: 4.0000 mg | ORAL_TABLET | Freq: Four times a day (QID) | ORAL | Status: DC | PRN

## 2017-10-05 NOTE — ED Triage Notes (Signed)
Per EMS, pt from home with resp distress, states hx of COPD and CHF. EMS reports pt was given 1 albuterol and 1 duoneb as well as 125 solumedrol. Pt was "in the 60's o2 saturation on our arrival and with cpap she came to 86%" per EMS. Pt is dialysis pt with fistula on left arm and access on chest. Pt placed on bipap on arrival. EDP in rm.

## 2017-10-05 NOTE — ED Notes (Addendum)
This RN tried to get another IV on pt, pt unable to tolerate insertion and moves and jerks her arm away when attempting to insert IV. IV was able to penetrate skin however pt pulled arm away and IV came right back out. Pt states to "try again later." Pt does have one patent IV at this time. Pt's husband at bedside holding pt's hand.

## 2017-10-05 NOTE — ED Notes (Signed)
Attempt additional iv initiation x2 without success.

## 2017-10-05 NOTE — H&P (Signed)
[facility] [place] NAME: Michele Nash    MR#:  [id number]  DATE OF BIRTH:  15-May-1956  DATE OF ADMISSION:  10/05/2017  PRIMARY CARE PHYSICIAN: [name], MD   REQUESTING/REFERRING PHYSICIAN:   CHIEF COMPLAINT:   Chief Complaint  Patient presents with  . Respiratory Distress    HISTORY OF PRESENT ILLNESS: Michele Nash  is a 61 y.o. female with a known history per below, presenting to the emergency room with acute shortness of breath, worsening over the last 1-2 days, noted O2 saturation in the 60s by EMS-treated with breathing treatments/Solu-Medrol/CPAP, in the emergency room patient was placed on BiPAP immediately for acute COPD and fluid overload from CHF, noted severe respiratory acidosis with pH of 7.0, PCO2 of 116, BNP 350, potassium 7.4, creatinine 6.7-has end-stage renal disease for which patient receives hemodialysis Monday/Wednesday/Fridays, glucose 228, EKG noted for tachycardia heart rate 118/anterior infarct/lateral ST segment depression, white count 11.8, patient evaluated in the emergency room, in moderate respiratory distress, inability to speak in full sentences, noted JVD, diffuse Rales/severely diminished breath sounds bilaterally, patient is now been admitted for acute fluid overload state, acute on chronic congestive heart failure exacerbation, and COPD.  PAST MEDICAL HISTORY:   Past Medical History:  Diagnosis Date  . Altered mental status   . Anemia   . Apnea, sleep    for sleep study 10/17/15-no cpap yet  . Arthritis   . Bronchitis   . CHF (congestive heart failure) ([place])   . Chronic kidney disease   . COPD (chronic obstructive pulmonary disease) ([place])   . Depression   . Dialysis patient ([place]) 2014  . GERD (gastroesophageal reflux disease)   . GIB (gastrointestinal bleeding) 07/09/2016  . Headache   . Hyperlipidemia   . Hypertension   . Obesity   . Pulmonary edema 09/14/2016  . Renal dialysis device,  implant, or graft complication    RIGHT CHEST CATH  . Respiratory failure ([place]) 07/23/2016  . Traumatic hematoma of forehead   . Type II diabetes mellitus ([place]) 09/21/2009   diet controlled    PAST SURGICAL HISTORY:  Past Surgical History:  Procedure Laterality Date  . ABDOMINAL HYSTERECTOMY    . COLONOSCOPY  2011  . DIALYSIS FISTULA CREATION      SOCIAL HISTORY:  Social History   Tobacco Use  . Smoking status: Former Smoker    Years: 40.00    Types: Cigarettes    Last attempt to quit: 07/28/2017    Years since quitting: 0.1  . Smokeless tobacco: Never Used  Substance Use Topics  . Alcohol use: No    FAMILY HISTORY:  Family History  Problem Relation Age of Onset  . Heart disease Mother   . Cancer Father   . Cancer Sister   . Breast cancer Sister     DRUG ALLERGIES:  Allergies  Allergen Reactions  . Sulfa Antibiotics Itching, Swelling, Rash and Other (See Comments)    Reaction:  Facial/body swelling     REVIEW OF SYSTEMS: Inability to obtain fully given acute respiratory distress  CONSTITUTIONAL: No fever, +fatigue/weakness.  EYES: No blurred or double vision.  EARS, NOSE, AND THROAT: No tinnitus or ear pain.  RESPIRATORY: + cough/shortness of breath,no wheezing or hemoptysis.  CARDIOVASCULAR: No chest pain, orthopnea, right leg edema.  GASTROINTESTINAL: No nausea, vomiting, diarrhea or abdominal pain.  GENITOURINARY: No dysuria, hematuria.  ENDOCRINE: No polyuria, nocturia,  HEMATOLOGY: No anemia, easy bruising or bleeding SKIN: No rash  or lesion. MUSCULOSKELETAL: No joint pain or arthritis.   NEUROLOGIC: No tingling, numbness, weakness.  PSYCHIATRY: No anxiety or depression.   MEDICATIONS AT HOME:  Prior to Admission medications   Medication Sig Start Date End Date Taking? Authorizing Provider  albuterol (PROVENTIL HFA;VENTOLIN HFA) 108 (90 Base) MCG/ACT inhaler Inhale 2 puffs into the lungs every 6 (six) hours as needed for wheezing or shortness of  breath. 12/11/16  Yes Gouru, Illene Silver, MD  ALPRAZolam Duanne Moron) 0.5 MG tablet Take 0.5 mg by mouth 2 (two) times daily.   Yes [provider]  amLODipine (NORVASC) 10 MG tablet Take 10 mg by mouth daily.    Yes [provider]  calcium acetate (PHOSLO) 667 MG capsule Take 3 capsules (2,001 mg total) by mouth 3 (three) times daily with meals. 06/20/17  Yes Wieting, Richard, MD  carvedilol (COREG) 12.5 MG tablet Take 12.5 mg by mouth 2 (two) times daily with a meal.   Yes [provider]  cinacalcet (SENSIPAR) 30 MG tablet Take 60 mg by mouth daily.    Yes [provider]  ezetimibe (ZETIA) 10 MG tablet Take 10 mg by mouth daily.    Yes [provider]  fluticasone (FLONASE) 50 MCG/ACT nasal spray Place 2 sprays into both nostrils daily as needed for rhinitis.   Yes [provider]  furosemide (LASIX) 40 MG tablet Take 40 mg by mouth daily.    Yes [provider]  hydrALAZINE (APRESOLINE) 50 MG tablet Take 1 tablet (50 mg total) by mouth 3 (three) times daily. 08/13/17  Yes Mody, Ulice Bold, MD  hydrochlorothiazide (HYDRODIURIL) 25 MG tablet Take 1 tablet (25 mg total) by mouth daily. 09/11/17  Yes Menshew, Dannielle Karvonen, PA-C  ipratropium-albuterol (DUONEB) 0.5-2.5 (3) MG/3ML SOLN Take 3 mLs by nebulization every 6 (six) hours as needed. 12/11/16  Yes Gouru, Illene Silver, MD  lidocaine-prilocaine (EMLA) cream Apply 1 application topically as needed (prior to accessing port).   Yes [provider]  losartan (COZAAR) 100 MG tablet Take 100 mg by mouth daily.    Yes [provider]  multivitamin (RENA-VIT) TABS tablet Take 1 tablet by mouth at bedtime.    Yes [provider]  ondansetron (ZOFRAN) 4 MG tablet Take 1 tablet (4 mg total) by mouth every 8 (eight) hours as needed for nausea or vomiting. 03/24/17  Yes Lisa Roca, MD  pantoprazole (PROTONIX) 40 MG tablet Take 40 mg by mouth 2 (two) times daily.   Yes [provider]  sennosides-docusate sodium (SENOKOT-S) 8.6-50 MG tablet Take 1 tablet by mouth at bedtime as needed for constipation.   Yes [provider]  sertraline (ZOLOFT) 50 MG tablet Take 50 mg by mouth daily.    Yes [provider]  traMADol (ULTRAM) 50 MG tablet Take 1 tablet (50 mg total) by mouth every 12 (twelve) hours as needed for severe pain. 08/04/17 08/04/18 Yes Arta Silence, MD  Amino Acids-Protein Hydrolys (FEEDING SUPPLEMENT, PRO-STAT SUGAR FREE 64,) LIQD Take 30 mLs by mouth daily. 12/12/16   Gouru, Illene Silver, MD      PHYSICAL EXAMINATION:   VITAL SIGNS: Blood pressure (!) 142/77, pulse 75, temperature 97.9 F (36.6 C), temperature source Rectal, resp. rate (!) 25, height 5\' 7"  (1.702 m), weight 59 kg (130 lb), SpO2 98 %.  GENERAL:  61 y.o.-year-old patient lying in the bed with moderate respiratory acute distress.  Frail appearing eYES: Pupils equal, round, reactive to light and accommodation. No scleral icterus. Extraocular muscles intact.  HEENT: Head atraumatic, normocephalic. Oropharynx and nasopharynx clear.  NECK:  Supple, no jugular venous distention. No thyroid enlargement, no tenderness.  LUNGS: Severely diminished sounds bilaterally, + wheezing/rales more so at bases.  Increased work of breathing.  CARDIOVASCULAR: S1, S2 normal. SEM, rubs, or gallops.  ABDOMEN: Soft, nontender, nondistended. Bowel sounds present. No organomegaly or mass.  EXTREMITIES: Right lower extremity mild edema, cyanosis, or clubbing.  NEUROLOGIC: Cranial nerves II through XII are intact. MAES. Gait not checked.  PSYCHIATRIC: The patient is alert and oriented x 3.  SKIN: No obvious rash, lesion, or ulcer.   LABORATORY PANEL:   CBC Recent Labs  Lab 10/05/17 1922  WBC 11.8*  HGB 11.3*  HCT 37.6  PLT 294  MCV 106.0*  MCH 31.9  MCHC 30.1*  RDW 15.6*  LYMPHSABS 2.8  MONOABS 1.0*  EOSABS 0.4  BASOSABS 0.1    ------------------------------------------------------------------------------------------------------------------  Chemistries  Recent Labs  Lab 10/05/17 1922 10/05/17 2040  NA 138 138  K 7.4* 7.0*  CL 103 100*  CO2 23 26  GLUCOSE 228* 223*  BUN 55* 59*  CREATININE 6.72* 6.76*  CALCIUM 8.4* 8.1*   ------------------------------------------------------------------------------------------------------------------ estimated creatinine clearance is 7.9 mL/min (A) (by C-G formula based on SCr of 6.76 mg/dL (H)). ------------------------------------------------------------------------------------------------------------------ No results for input(s): TSH, T4TOTAL, T3FREE, THYROIDAB in the last 72 hours.  Invalid input(s): FREET3   Coagulation profile No results for input(s): INR, PROTIME in the last 168 hours. ------------------------------------------------------------------------------------------------------------------- No results for input(s): DDIMER in the last 72 hours. -------------------------------------------------------------------------------------------------------------------  Cardiac Enzymes Recent Labs  Lab 10/05/17 1922  TROPONINI 0.05*   ------------------------------------------------------------------------------------------------------------------ Invalid input(s): POCBNP  ---------------------------------------------------------------------------------------------------------------  Urinalysis    Component Value Date/Time   COLORURINE YELLOW (A) 06/16/2017 0032   APPEARANCEUR CLOUDY (A) 06/16/2017 0032   APPEARANCEUR Cloudy 06/04/2013 0324   LABSPEC 1.006 06/16/2017 0032   LABSPEC 1.006 06/04/2013 0324   PHURINE 8.0 06/16/2017 0032   GLUCOSEU 150 (A) 06/16/2017 0032   GLUCOSEU >=500 06/04/2013 0324   HGBUR SMALL (A) 06/16/2017 0032   BILIRUBINUR NEGATIVE 06/16/2017 0032   BILIRUBINUR Negative 06/04/2013 0324   KETONESUR NEGATIVE 06/16/2017  0032   PROTEINUR 30 (A) 06/16/2017 0032   NITRITE NEGATIVE 06/16/2017 0032   LEUKOCYTESUR LARGE (A) 06/16/2017 0032   LEUKOCYTESUR 3+ 06/04/2013 0324     RADIOLOGY: Dg Chest Portable 1 View  Result Date: 10/05/2017 CLINICAL DATA:  Dyspnea EXAM: PORTABLE CHEST 1 VIEW COMPARISON:  08/11/2017 chest radiograph. FINDINGS: Right internal jugular central venous catheter terminates in the middle third of the superior vena cava. Stable cardiomediastinal silhouette with mild cardiomegaly and aortic atherosclerosis. No pneumothorax. Trace bilateral pleural effusions. Hazy and linear parahilar lung opacities in both lungs, compatible with mild-to-moderate pulmonary edema. IMPRESSION: Mild-to-moderate congestive heart failure with trace bilateral pleural effusions. Electronically Signed   By: Ilona Sorrel M.D.   On: 10/05/2017 19:53    EKG: Orders placed or performed during the hospital encounter of 10/05/17  . EKG 12-Lead  . EKG 12-Lead    IMPRESSION AND PLAN: 1 acute on chronic recurrent back/hypoxic respiratory failure Most likely secondary to multifactorial process which includes acute on chronic diastolic congestive heart failure exacerbation, acute fluid overload state with end-stage renal disease on hemodialysis, and probable COPD exacerbation Admit to ICU, discussed with critical care attending and nephrology given need for probable urgent hemodialysis/possible need for intubation, continue BiPAP for now, supplemental oxygen as needed, IV Lasix twice daily, strict I&O monitoring, daily weights, rule out acute coronary syndrome with cardiac enzymes  x3 sets, IV Solu-Medrol with tapering as tolerated, inhaled corticosteroids twice daily, aggressive pulmonary toilet with bronchodilator therapy, follow-up on cultures, and continue close medical monitoring  2 acute on chronic diastolic congestive heart failure exacerbation Compounded by acute fluid overload state and COPD exacerbation Plan of care  as stated above, complete MAR when available, IV Lopressor for now  3 acute fluid overload state Secondary to end-stage renal disease-on hemodialysis Mondays/Wednesdays/Fridays  Patient is confused as to when she received last dialysis  Plan of care as stated above  4 acute on COPD exacerbation Plan of care as stated above  5 acute on chronic hypoxic/hypercarbic respiratory failure Plan of care as stated above  6 history of obstructive sleep apnea On CPAP at home  7 chronic tobacco smoking abuse/dependency Patient states that she quit 2 weeks ago, yet smells like smoke Nicotine patch daily and cessation counseling ordered  8 chronic GERD without esophagitis PPI daily  9 chronic diabetes mellitus type 2 Sliding scale insulin and Accu-Cheks per routine for now  10 incomplete MAR Please complete when available  Full code Condition stable Prognosis fair DVT prophylaxis with heparin subcu Disposition Home in 2-3 days    All the records are reviewed and case discussed with ED provider. Management plans discussed with the patient, family and they are in agreement.  CODE STATUS: Code Status History    Date Active Date Inactive Code Status Order ID Comments User Context   08/11/2017 17:56 08/13/2017 16:15 Full Code [id number]  Vaughan Basta, MD Inpatient   06/15/2017 19:42 06/20/2017 16:52 Full Code [id number]  Idelle Crouch, MD Inpatient   05/21/2017 17:58 05/22/2017 17:24 Full Code [id number]  Nicholes Mango, MD Inpatient   12/04/2016 09:55 12/11/2016 17:41 Full Code [id number]  Fritzi Mandes, MD Inpatient   10/29/2016 12:14 10/30/2016 19:43 Full Code [id number]  Laverle Hobby, MD ED   10/06/2016 20:33 10/11/2016 15:54 Full Code [id number]  Mikael Spray, NP ED   09/14/2016 09:19 09/16/2016 16:07 Full Code [id number]  Laverle Hobby, MD ED   07/23/2016 11:05 07/24/2016 22:49 Full Code [id number]  Wilhelmina Mcardle, MD ED   07/09/2016 18:43 07/13/2016 17:23 Full Code  [id number]  Lytle Butte, MD ED   05/28/2016 14:54 06/02/2016 14:51 Full Code [id number]  Theodoro Grist, MD Inpatient   04/25/2016 18:04 04/26/2016 21:17 Full Code [id number]  Nicholes Mango, MD Inpatient   10/28/2015 18:50 10/30/2015 18:09 Full Code [id number]  Theodoro Grist, MD Inpatient   03/30/2015 00:51 03/31/2015 16:44 Full Code [id number]  Juluis Mire, MD Inpatient       TOTAL TIME TAKING CARE OF THIS PATIENT: 45 minutes.    Avel Peace Marley Pakula M.D on 10/05/2017   Between 7am to 6pm - Pager - [phone]  After 6pm go to [email or web address] - password [email or web address] [facility]  Office  [phone]  CC: Primary care physician; [name], MD   Note: This dictation was prepared with Dragon dictation along with smaller phrase technology. Any transcriptional errors that result from this process are unintentional.

## 2017-10-05 NOTE — ED Notes (Addendum)
Pt's husband states pt wears 4L of oxygen at home chronically. Pt states she quit smoking 2 weeks ago.

## 2017-10-05 NOTE — ED Provider Notes (Signed)
[facility] Emergency Department Provider Note ____________________________________________   First MD Initiated Contact with Patient 10/05/17 1915     (approximate)  I have reviewed the triage vital signs and the nursing notes.   HISTORY  Chief Complaint Respiratory Distress  History of present illness severely limited by acute respiratory distress.  HPI Michele Nash is a 61 y.o. female with a history of CHF, COPD, and other past medical history as noted below who presents with shortness of breath, acute onset over the last several hours.  Past Medical History:  Diagnosis Date  . Altered mental status   . Anemia   . Apnea, sleep    for sleep study 10/17/15-no cpap yet  . Arthritis   . Bronchitis   . CHF (congestive heart failure) ([place])   . Chronic kidney disease   . COPD (chronic obstructive pulmonary disease) ([place])   . Depression   . Dialysis patient ([place]) 2014  . GERD (gastroesophageal reflux disease)   . GIB (gastrointestinal bleeding) 07/09/2016  . Headache   . Hyperlipidemia   . Hypertension   . Obesity   . Pulmonary edema 09/14/2016  . Renal dialysis device, implant, or graft complication    RIGHT CHEST CATH  . Respiratory failure ([place]) 07/23/2016  . Traumatic hematoma of forehead   . Type II diabetes mellitus ([place]) 09/21/2009   diet controlled    Patient Active Problem List   Diagnosis Date Noted  . Hypertensive urgency 08/11/2017  . UTI (urinary tract infection) 06/17/2017  . Dizziness 06/15/2017  . Chronic diastolic CHF (congestive heart failure) ([place]) 06/15/2017  . Hyperkalemia 05/21/2017  . Multiple closed fractures of ribs of right side   . Hyperlipidemia 11/28/2016  . Altered mental status   . Fall   . Traumatic hematoma of forehead   . Protein-calorie malnutrition, severe 05/29/2016  . Hyperglycemia 05/28/2016  . Pain in limb 10/28/2015  . ESRD on dialysis ([place]) 10/28/2015  . HTN (hypertension) 03/29/2015  . COPD  (chronic obstructive pulmonary disease) ([place]) 03/29/2015  . GAD (generalized anxiety disorder) 03/29/2015  . Chronic kidney disease 10/18/2009  . Personal history of tobacco use, presenting hazards to health 10/18/2009  . Type II diabetes mellitus ([place]) 09/21/2009  . Disorder of uterus 02/14/1990    Past Surgical History:  Procedure Laterality Date  . ABDOMINAL HYSTERECTOMY    . COLONOSCOPY  2011  . DIALYSIS FISTULA CREATION      Prior to Admission medications   Medication Sig Start Date End Date Taking? Authorizing Provider  albuterol (PROVENTIL HFA;VENTOLIN HFA) 108 (90 Base) MCG/ACT inhaler Inhale 2 puffs into the lungs every 6 (six) hours as needed for wheezing or shortness of breath. 12/11/16   Gouru, Illene Silver, MD  ALPRAZolam Duanne Moron) 0.5 MG tablet Take 0.5 mg by mouth 2 (two) times daily.    [provider]  Amino Acids-Protein Hydrolys (FEEDING SUPPLEMENT, PRO-STAT SUGAR FREE 64,) LIQD Take 30 mLs by mouth daily. 12/12/16   Gouru, Illene Silver, MD  amLODipine (NORVASC) 10 MG tablet Take 10 mg by mouth daily.     [provider]  calcium acetate (PHOSLO) 667 MG capsule Take 3 capsules (2,001 mg total) by mouth 3 (three) times daily with meals. 06/20/17   Loletha Grayer, MD  carvedilol (COREG) 12.5 MG tablet Take 12.5 mg by mouth 2 (two) times daily with a meal.    [provider]  cinacalcet (SENSIPAR) 30 MG tablet Take 60 mg by mouth daily.     [provider]  ezetimibe (ZETIA) 10 MG tablet Take 10 mg by mouth daily.     [provider]  fluticasone (FLONASE) 50 MCG/ACT nasal spray Place 2 sprays into both nostrils daily as needed for rhinitis.    [provider]  furosemide (LASIX) 40 MG tablet Take 40 mg by mouth daily.     [provider]  hydrALAZINE (APRESOLINE) 50 MG tablet Take 1 tablet (50 mg total) by mouth 3 (three) times daily. 08/13/17   Bettey Costa, MD  hydrochlorothiazide (HYDRODIURIL) 25 MG tablet Take 1 tablet (25  mg total) by mouth daily. 09/11/17   Menshew, Dannielle Karvonen, PA-C  ipratropium-albuterol (DUONEB) 0.5-2.5 (3) MG/3ML SOLN Take 3 mLs by nebulization every 6 (six) hours as needed. 12/11/16   Nicholes Mango, MD  lidocaine-prilocaine (EMLA) cream Apply 1 application topically as needed (prior to accessing port).    [provider]  losartan (COZAAR) 100 MG tablet Take 100 mg by mouth daily.     [provider]  multivitamin (RENA-VIT) TABS tablet Take 1 tablet by mouth at bedtime.     [provider]  ondansetron (ZOFRAN) 4 MG tablet Take 1 tablet (4 mg total) by mouth every 8 (eight) hours as needed for nausea or vomiting. 03/24/17   Lisa Roca, MD  pantoprazole (PROTONIX) 40 MG tablet Take 40 mg by mouth 2 (two) times daily.    [provider]  sennosides-docusate sodium (SENOKOT-S) 8.6-50 MG tablet Take 1 tablet by mouth at bedtime as needed for constipation.    [provider]  sertraline (ZOLOFT) 50 MG tablet Take 50 mg by mouth daily.     [provider]  traMADol (ULTRAM) 50 MG tablet Take 1 tablet (50 mg total) by mouth every 12 (twelve) hours as needed for severe pain. 08/04/17 08/04/18  Arta Silence, MD    Allergies Sulfa antibiotics  Family History  Problem Relation Age of Onset  . Heart disease Mother   . Cancer Father   . Cancer Sister   . Breast cancer Sister     Social History Social History   Tobacco Use  . Smoking status: Former Smoker    Years: 40.00    Types: Cigarettes    Last attempt to quit: 07/28/2017    Years since quitting: 0.1  . Smokeless tobacco: Never Used  Substance Use Topics  . Alcohol use: No  . Drug use: No    Review of Systems Level V caveat: Unable to obtain review of systems due to acute respiratory distress.    ____________________________________________   PHYSICAL EXAM:  VITAL SIGNS: ED Triage Vitals  Enc Vitals Group     BP 10/05/17 1915 (!) 227/148     Pulse Rate 10/05/17  1915 (!) 117     Resp 10/05/17 1915 (!) 33     Temp 10/05/17 1958 97.9 F (36.6 C)     Temp Source 10/05/17 1958 Rectal     SpO2 10/05/17 1915 100 %     Weight 10/05/17 1916 130 lb (59 kg)     Height 10/05/17 1916 5\' 7"  (1.702 m)     Head Circumference --      Peak Flow --      Pain Score --      Pain Loc --      Pain Edu? --      Excl. in [place]? --     Constitutional: Tired appearing but arousable to loud voice.  Eyes: Conjunctivae are normal.  EOMI.   Head: Atraumatic. Nose: No congestion/rhinnorhea. Mouth/Throat: Mucous membranes are moist.   Neck: Normal range of motion.  Cardiovascular: Tachycardic, regular rhythm. Grossly normal heart sounds.  Good peripheral circulation. Respiratory: Respiratory distress.  Bilateral ronchi/coarse breath sounds.   Gastrointestinal: No distention.  Genitourinary: No flank tenderness. Musculoskeletal: No lower extremity edema.  Extremities warm and well perfused.  Neurologic:  Normal speech and language. Motor intact in all exremities.  No gross focal neurologic deficits are appreciated.  Skin:  Skin is warm and dry. No rash noted. Psychiatric: Mood and affect are normal. Speech and behavior are normal.  ____________________________________________   LABS (all labs ordered are listed, but only abnormal results are displayed)  Labs Reviewed  BASIC METABOLIC PANEL - Abnormal; Notable for the following components:      Result Value   Potassium 7.4 (*)    Glucose, Bld 228 (*)    BUN 55 (*)    Creatinine, Ser 6.72 (*)    Calcium 8.4 (*)    GFR calc non Af Amer 6 (*)    GFR calc Af Amer 7 (*)    All other components within normal limits  CBC WITH DIFFERENTIAL/PLATELET - Abnormal; Notable for the following components:   WBC 11.8 (*)    RBC 3.55 (*)    Hemoglobin 11.3 (*)    MCV 106.0 (*)    MCHC 30.1 (*)    RDW 15.6 (*)    Neutro Abs 7.4 (*)    Monocytes Absolute 1.0 (*)    All other components within normal limits  TROPONIN I -  Abnormal; Notable for the following components:   Troponin I 0.05 (*)    All other components within normal limits  BRAIN NATRIURETIC PEPTIDE - Abnormal; Notable for the following components:   B Natriuretic Peptide 350.0 (*)    All other components within normal limits  BLOOD GAS, VENOUS - Abnormal; Notable for the following components:   pH, Ven 7.01 (*)    pCO2, Ven 116 (*)    Bicarbonate 29.3 (*)    Acid-base deficit 3.9 (*)    All other components within normal limits  BLOOD GAS, VENOUS - Abnormal; Notable for the following components:   pH, Ven 7.12 (*)    pCO2, Ven 95 (*)    pO2, Ven <31.0 (*)    Bicarbonate 30.9 (*)    All other components within normal limits  BASIC METABOLIC PANEL   ____________________________________________  EKG  ED ECG REPORT I, Arta Silence, the attending physician, personally viewed and interpreted this ECG.  Date: 10/05/2017 EKG Time: 1913 Rate: 118 Rhythm: Sinus tachycardia QRS Axis: normal Intervals: normal ST/T Wave abnormalities: normal Narrative Interpretation: Lateral ST depression and T wave inversion, changed from prior EKG; possible acute ischemia.  No STEMI.   ____________________________________________  RADIOLOGY  CXR: Mild to moderate acute CHF  ____________________________________________   PROCEDURES  Procedure(s) performed: No    Critical Care performed: Yes  CRITICAL CARE Performed by: Arta Silence   Total critical care time: 50 minutes  Critical care time was exclusive of separately billable procedures and treating other patients.  Critical care was necessary to treat or prevent imminent or life-threatening deterioration.  Critical care was time spent personally by me on the following activities: development of treatment plan with patient and/or surrogate as well as nursing, discussions with consultants, evaluation of patient's response to treatment, examination of patient, obtaining history  from patient or surrogate, ordering and performing treatments and interventions, ordering and  review of laboratory studies, ordering and review of radiographic studies, pulse oximetry and re-evaluation of patient's condition. ____________________________________________   INITIAL IMPRESSION / ASSESSMENT AND PLAN / ED COURSE  Pertinent labs & imaging results that were available during my care of the patient were reviewed by me and considered in my medical decision making (see chart for details).  61 year old female with history of COPD and CHF presents with acute respiratory distress from home, developing apparently over the last several hours.  She got albuterol nebs, steroid and CPAP in the field by EMS for presumed COPD exacerbation.  Review of past medical records in epic reveals diastolic CHF, with last echo in 2017, and admission 2 months ago for acute respiratory failure and pulmonary edema.  On exam, patient was tired appearing, but arousable to voice and responding appropriately to questions and commands.  He was in acute respiratory distress.  She was placed on BiPAP, and on the monitor.  Lung exam revealed coarse breath sounds, unclear if more consistent with COPD or pulmonary edema.  However patient's blood pressure was significantly elevated, raising suspicion for acute CHF.  Patient was given multiple rounds of duonebs, IV magnesium, and started on a nitroglycerin drip.  VBG showed elevated CO2.  Based on these findings, presentation is consistent with acute respiratory failure from likely mixed picture of acute COPD and CHF.  Lower suspicion for pneumonia, or ACS.  Patient displayed rapid improvement after approximately 20-30 minutes on BiPAP and with these medications.  Blood pressure now relatively well controlled.  And patient was able to joke to me that she would like to go home.  She reports significantly improved symptoms.  Plan: Continue BiPAP, cardiac monitoring, repeat VBG,  and plan for admission.    ----------------------------------------- 9:28 PM on 10/05/2017 -----------------------------------------  Patient is now no longer respiratory distress, blood pressure is well controlled, and she states she feels better.  Potassium was 7.4 on BMP; it was noted to be hemolyzed, but patient's T waves are slightly peaked, so we will treat.  ----------------------------------------- 9:48 PM on 10/05/2017 -----------------------------------------  Patient admitted, signed out to Dr. Clyda Hurdle.  ____________________________________________   FINAL CLINICAL IMPRESSION(S) / ED DIAGNOSES  Final diagnoses:  Acute respiratory failure with hypoxia and hypercapnia (HCC)  COPD exacerbation (HCC)  Acute pulmonary edema (HCC)  Hyperkalemia      NEW MEDICATIONS STARTED DURING THIS VISIT:  This SmartLink is deprecated. Use AVSMEDLIST instead to display the medication list for a patient.   Note:  This document was prepared using Dragon voice recognition software and may include unintentional dictation errors.    Arta Silence, MD 10/05/17 2148

## 2017-10-05 NOTE — Progress Notes (Signed)
eLink Physician-Brief Progress Note Patient Name: Michele Nash DOB: 1956-07-24 MRN: [id number]   Date of Service  10/05/2017  HPI/Events of Note  61 yo female with PMH of CHF, COPD and ESRD. Presents with hypoxia and ABG = 7.0/116/. Now on BiPAP. K+ = 7.4 --> 7.0 s/p Rx with Ca++, insulin/D50 and B-Agonist.  Will need HD and nephrology is being consulted. PCCM asked to assume care in the ICU.  VSS.  eICU Interventions  No new orders.     Intervention Category Evaluation Type: New Patient Evaluation  Lysle Dingwall 10/05/2017, 11:56 PM

## 2017-10-06 ENCOUNTER — Other Ambulatory Visit: Payer: Self-pay

## 2017-10-06 DIAGNOSIS — J9601 Acute respiratory failure with hypoxia: Secondary | ICD-10-CM

## 2017-10-06 LAB — CBC
HEMATOCRIT: 33.6 % — AB (ref 35.0–47.0)
Hemoglobin: 10.5 g/dL — ABNORMAL LOW (ref 12.0–16.0)
MCH: 31.6 pg (ref 26.0–34.0)
MCHC: 31.3 g/dL — AB (ref 32.0–36.0)
MCV: 100.8 fL — AB (ref 80.0–100.0)
Platelets: 248 10*3/uL (ref 150–440)
RBC: 3.34 MIL/uL — ABNORMAL LOW (ref 3.80–5.20)
RDW: 14.4 % (ref 11.5–14.5)
WBC: 7 10*3/uL (ref 3.6–11.0)

## 2017-10-06 LAB — MRSA PCR SCREENING: MRSA by PCR: POSITIVE — AB

## 2017-10-06 LAB — BASIC METABOLIC PANEL
Anion gap: 13 (ref 5–15)
BUN: 29 mg/dL — AB (ref 6–20)
CHLORIDE: 96 mmol/L — AB (ref 101–111)
CO2: 28 mmol/L (ref 22–32)
Calcium: 8.8 mg/dL — ABNORMAL LOW (ref 8.9–10.3)
Creatinine, Ser: 4.33 mg/dL — ABNORMAL HIGH (ref 0.44–1.00)
GFR calc Af Amer: 12 mL/min — ABNORMAL LOW (ref >60)
GFR calc non Af Amer: 10 mL/min — ABNORMAL LOW (ref >60)
GLUCOSE: 138 mg/dL — AB (ref 65–99)
POTASSIUM: 5 mmol/L (ref 3.5–5.1)
Sodium: 137 mmol/L (ref 135–145)

## 2017-10-06 LAB — TROPONIN I
TROPONIN I: 0.08 ng/mL — AB (ref <0.03)
TROPONIN I: 0.09 ng/mL — AB (ref <0.03)
TROPONIN I: 0.08 ng/mL — AB (ref <0.03)

## 2017-10-06 LAB — PHOSPHORUS: PHOSPHORUS: 3.6 mg/dL (ref 2.5–4.6)

## 2017-10-06 LAB — GLUCOSE, CAPILLARY
GLUCOSE-CAPILLARY: 127 mg/dL — AB (ref 65–99)
GLUCOSE-CAPILLARY: 419 mg/dL — AB (ref 65–99)
GLUCOSE-CAPILLARY: 272 mg/dL — AB (ref 65–99)
Glucose-Capillary: 102 mg/dL — ABNORMAL HIGH (ref 65–99)
Glucose-Capillary: 111 mg/dL — ABNORMAL HIGH (ref 65–99)
Glucose-Capillary: 191 mg/dL — ABNORMAL HIGH (ref 65–99)

## 2017-10-06 MED ORDER — FAMOTIDINE 20 MG PO TABS
20.0000 mg | ORAL_TABLET | Freq: Once | ORAL | Status: AC
  Administered 2017-10-06: 20 mg via ORAL
  Filled 2017-10-06: qty 1

## 2017-10-06 MED ORDER — AMLODIPINE BESYLATE 10 MG PO TABS
10.0000 mg | ORAL_TABLET | Freq: Every day | ORAL | Status: DC
  Administered 2017-10-06 – 2017-10-07 (×2): 10 mg via ORAL
  Filled 2017-10-06 (×2): qty 1

## 2017-10-06 MED ORDER — CALCIUM ACETATE (PHOS BINDER) 667 MG PO CAPS
2001.0000 mg | ORAL_CAPSULE | Freq: Three times a day (TID) | ORAL | Status: DC
  Administered 2017-10-06 – 2017-10-07 (×4): 2001 mg via ORAL
  Filled 2017-10-06 (×5): qty 3

## 2017-10-06 MED ORDER — CHLORHEXIDINE GLUCONATE CLOTH 2 % EX PADS
6.0000 | MEDICATED_PAD | Freq: Every day | CUTANEOUS | Status: DC
  Administered 2017-10-07: 6 via TOPICAL

## 2017-10-06 MED ORDER — FUROSEMIDE 40 MG PO TABS
40.0000 mg | ORAL_TABLET | Freq: Every day | ORAL | Status: DC
  Administered 2017-10-06 – 2017-10-07 (×2): 40 mg via ORAL
  Filled 2017-10-06 (×2): qty 1

## 2017-10-06 MED ORDER — CARVEDILOL 12.5 MG PO TABS
12.5000 mg | ORAL_TABLET | Freq: Two times a day (BID) | ORAL | Status: DC
Start: 2017-10-06 — End: 2017-10-07
  Administered 2017-10-06 – 2017-10-07 (×2): 12.5 mg via ORAL
  Filled 2017-10-06 (×2): qty 1

## 2017-10-06 MED ORDER — MUPIROCIN 2 % EX OINT
1.0000 "application " | TOPICAL_OINTMENT | Freq: Two times a day (BID) | CUTANEOUS | Status: DC
  Administered 2017-10-06: 1 via NASAL
  Filled 2017-10-06: qty 22

## 2017-10-06 MED ORDER — CINACALCET HCL 30 MG PO TABS
60.0000 mg | ORAL_TABLET | Freq: Every day | ORAL | Status: DC
  Administered 2017-10-06: 60 mg via ORAL
  Filled 2017-10-06 (×2): qty 2

## 2017-10-06 MED ORDER — LOSARTAN POTASSIUM 50 MG PO TABS
100.0000 mg | ORAL_TABLET | Freq: Every day | ORAL | Status: DC
  Administered 2017-10-06 – 2017-10-07 (×2): 100 mg via ORAL
  Filled 2017-10-06 (×2): qty 2

## 2017-10-06 NOTE — Progress Notes (Signed)
Moved to 201 via bed. Report called to [facility].

## 2017-10-06 NOTE — Consult Note (Signed)
PULMONARY / CRITICAL CARE MEDICINE   Name: Michele Nash MRN: [id number] DOB: 09-11-1956    ADMISSION DATE:  10/05/2017   CONSULTATION DATE:  10/06/2017  REFERRING MD:  Dr. Jerelyn Charles  Reason: Acute pulmonary edema and acute CHF exacerbation needing BiPAP  CHIEF COMPLAINT: Acute respiratory distress  HISTORY OF PRESENT ILLNESS:   Michele Nash is a patient who is well-known to our service. She has a past medical history as indicated below. She presented today with complaints of acute respiratory distress. She was given albuterol and at 1 minute and Solu-Medrol by EMS with mild improvement. At the ED, her SPO2 was 86% on CPAP.Marland Kitchen Her chest x-ray in the ED showed acute pulmonary edema. She was transitioned to BiPAP and is being admitted to the ICU for emergent hemodialysis. . It is unclear if patient had hemodialysis on Friday or not. She denies skipping any dialysis sessions, but on further questioning, patient states that she doesn't know  PAST MEDICAL HISTORY :  She  has a past medical history of Altered mental status, Anemia, Apnea, sleep, Arthritis, Bronchitis, CHF (congestive heart failure) ([place]), Chronic kidney disease, COPD (chronic obstructive pulmonary disease) ([place]), Depression, Dialysis patient ([place]) (2014), GERD (gastroesophageal reflux disease), GIB (gastrointestinal bleeding) (07/09/2016), Headache, Hyperlipidemia, Hypertension, Obesity, Pulmonary edema (09/14/2016), Renal dialysis device, implant, or graft complication, Respiratory failure ([place]) (07/23/2016), Traumatic hematoma of forehead, and Type II diabetes mellitus ([place]) (09/21/2009).  PAST SURGICAL HISTORY: She  has a past surgical history that includes Abdominal hysterectomy; Dialysis fistula creation; Colonoscopy (2011); LIGATION OF ARTERIOVENOUS  FISTULA ( PERMCATH INSERTION ) (Left, 05/24/2017); ESOPHAGOGASTRODUODENOSCOPY (EGD) (N/A, 07/13/2016); ESOPHAGOGASTRODUODENOSCOPY (EGD) WITH PROPOFOL (N/A, 06/02/2016); Dialysis/Perma  Catheter Removal (N/A, 01/03/2016); REVISON OF ARTERIOVENOUS FISTULA WITH ARTEGRAFT (Left, 10/28/2015); Resection of shoulder cyst ( left ) (Left, 10/28/2015); COLONOSCOPY WITH PROPOFOL (N/A, 09/20/2015); A/V Shuntogram/Fistulagram (Left, 08/23/2015); A/V Shunt Intervention (N/A, 08/23/2015); Dialysis/Perma Catheter Insertion (08/23/2015); A/V Shuntogram/Fistulagram (N/A, 05/10/2015); and A/V Shunt Intervention (Left, 05/10/2015).  Allergies  Allergen Reactions  . Sulfa Antibiotics Itching, Swelling, Rash and Other (See Comments)    Reaction:  Facial/body swelling     No current facility-administered medications on file prior to encounter.    Current Outpatient Medications on File Prior to Encounter  Medication Sig  . albuterol (PROVENTIL HFA;VENTOLIN HFA) 108 (90 Base) MCG/ACT inhaler Inhale 2 puffs into the lungs every 6 (six) hours as needed for wheezing or shortness of breath.  . ALPRAZolam (XANAX) 0.5 MG tablet Take 0.5 mg by mouth 2 (two) times daily.  Marland Kitchen amLODipine (NORVASC) 10 MG tablet Take 10 mg by mouth daily.   . calcium acetate (PHOSLO) 667 MG capsule Take 3 capsules (2,001 mg total) by mouth 3 (three) times daily with meals.  . carvedilol (COREG) 12.5 MG tablet Take 12.5 mg by mouth 2 (two) times daily with a meal.  . cinacalcet (SENSIPAR) 30 MG tablet Take 60 mg by mouth daily.   Marland Kitchen ezetimibe (ZETIA) 10 MG tablet Take 10 mg by mouth daily.   . fluticasone (FLONASE) 50 MCG/ACT nasal spray Place 2 sprays into both nostrils daily as needed for rhinitis.  . furosemide (LASIX) 40 MG tablet Take 40 mg by mouth daily.   . hydrALAZINE (APRESOLINE) 50 MG tablet Take 1 tablet (50 mg total) by mouth 3 (three) times daily.  . hydrochlorothiazide (HYDRODIURIL) 25 MG tablet Take 1 tablet (25 mg total) by mouth daily.  Marland Kitchen ipratropium-albuterol (DUONEB) 0.5-2.5 (3) MG/3ML SOLN Take 3 mLs by nebulization every 6 (six) hours as needed.  Marland Kitchen  lidocaine-prilocaine (EMLA) cream Apply 1 application topically as  needed (prior to accessing port).  . losartan (COZAAR) 100 MG tablet Take 100 mg by mouth daily.   . multivitamin (RENA-VIT) TABS tablet Take 1 tablet by mouth at bedtime.   . ondansetron (ZOFRAN) 4 MG tablet Take 1 tablet (4 mg total) by mouth every 8 (eight) hours as needed for nausea or vomiting.  . pantoprazole (PROTONIX) 40 MG tablet Take 40 mg by mouth 2 (two) times daily.  . sennosides-docusate sodium (SENOKOT-S) 8.6-50 MG tablet Take 1 tablet by mouth at bedtime as needed for constipation.  . sertraline (ZOLOFT) 50 MG tablet Take 50 mg by mouth daily.   . traMADol (ULTRAM) 50 MG tablet Take 1 tablet (50 mg total) by mouth every 12 (twelve) hours as needed for severe pain.  . Amino Acids-Protein Hydrolys (FEEDING SUPPLEMENT, PRO-STAT SUGAR FREE 64,) LIQD Take 30 mLs by mouth daily.    FAMILY HISTORY:  Her indicated that her mother is deceased. She indicated that her father is deceased. She indicated that her sister is deceased.   SOCIAL HISTORY: She  reports that she quit smoking about 2 months ago. Her smoking use included cigarettes. She quit after 40.00 years of use. she has never used smokeless tobacco. She reports that she does not drink alcohol or use drugs.  REVIEW OF SYSTEMS:   Unable to obtain as patient is currently on continuous BiPAP  SUBJECTIVE:   VITAL SIGNS: BP (!) 155/95   Pulse 73   Temp 97.8 F (36.6 C) (Axillary)   Resp 20   Ht 5\' 7"  (1.702 m)   Wt 134 lb 11.2 oz (61.1 kg)   SpO2 99%   BMI 21.10 kg/m   HEMODYNAMICS:    VENTILATOR SETTINGS:    INTAKE / OUTPUT: No intake/output data recorded.  PHYSICAL EXAMINATION: General:  Moderate respiratory distress Neuro:  Alert and oriented to person, place and time, no deficits HEENT:  PERRLA, trachea midline Cardiovascular:  Apical pulse regular, S1, S2, no murmur reculture gallop Lungs:  Increased work of breathing, bilateral breath sounds, diminished in the bases, rhonchi in anterior lung  fields Abdomen:  Normal bowel sounds in all 4 quadrants, palpation reveals no organomegaly Musculoskeletal:  Active range of motion in upper and lower extremities. No joint deformities Skin:  Warm and dry  LABS:  BMET Recent Labs  Lab 10/05/17 1922 10/05/17 2040  NA 138 138  K 7.4* 7.0*  CL 103 100*  CO2 23 26  BUN 55* 59*  CREATININE 6.72* 6.76*  GLUCOSE 228* 223*    Electrolytes Recent Labs  Lab 10/05/17 1922 10/05/17 2040  CALCIUM 8.4* 8.1*    CBC Recent Labs  Lab 10/05/17 1922  WBC 11.8*  HGB 11.3*  HCT 37.6  PLT 294    Coag's No results for input(s): APTT, INR in the last 168 hours.  Sepsis Markers No results for input(s): LATICACIDVEN, PROCALCITON, O2SATVEN in the last 168 hours.  ABG No results for input(s): PHART, PCO2ART, PO2ART in the last 168 hours.  Liver Enzymes No results for input(s): AST, ALT, ALKPHOS, BILITOT, ALBUMIN in the last 168 hours.  Cardiac Enzymes Recent Labs  Lab 10/05/17 1922 10/06/17 0115  TROPONINI 0.05* 0.08*    Glucose Recent Labs  Lab 10/05/17 2327 10/05/17 2357 10/06/17 0023  GLUCAP 103* 102* 111*    Imaging Dg Chest Portable 1 View  Result Date: 10/05/2017 CLINICAL DATA:  Dyspnea EXAM: PORTABLE CHEST 1 VIEW COMPARISON:  08/11/2017 chest radiograph.  FINDINGS: Right internal jugular central venous catheter terminates in the middle third of the superior vena cava. Stable cardiomediastinal silhouette with mild cardiomegaly and aortic atherosclerosis. No pneumothorax. Trace bilateral pleural effusions. Hazy and linear parahilar lung opacities in both lungs, compatible with mild-to-moderate pulmonary edema. IMPRESSION: Mild-to-moderate congestive heart failure with trace bilateral pleural effusions. Electronically Signed   By: Ilona Sorrel M.D.   On: 10/05/2017 19:53   SIGNIFICANT EVENTS: 10/06/2017: Admitted  LINES/TUBES: Peripheral IVs Dialysis shunt  DISCUSSION: 61 year old African-American female with  a history of end-stage renal disease presenting with acute CHF exacerbation, acute pulmonary edema and volume overload secondary to nonadherence to fluid restrictions  ASSESSMENT Acute pulmonary edema. Acute hypoxemic respiratory failure Acute CHF exacerbation Acute COPD exacerbation  PLAN Treatment plan pending nephrology Monitor electrolytes and replace as needed. Hemodynamic monitoring per ICU. IV fluids Keep nothing by mouth while on BiPAP Resume all home medications  FAMILY  - Updates: Patient updated on current treatment plan. No family at bedside  - Inter-disciplinary family meet or Palliative Care meeting due by:  day 7    Loyce Flaming S. [facility] ANP-BC Pulmonary and Critical Care Medicine [facility] Pager [phone] or [phone]  10/06/2017, 3:04 AM

## 2017-10-06 NOTE — Progress Notes (Signed)
This note also relates to the following rows which could not be included: Pulse Rate - Cannot attach notes to unvalidated device data Resp - Cannot attach notes to unvalidated device data SpO2 - Cannot attach notes to unvalidated device data  HD STARTED  

## 2017-10-06 NOTE — Progress Notes (Signed)
[facility]  ROUNDING NOTE   Subjective:   [name] admitted to [facility] on 10/05/2017 for Hyperkalemia [E87.5] Acute pulmonary edema (HCC) [J81.0] COPD exacerbation ([place]) [J44.1] Acute respiratory failure with hypoxia and hypercapnia ([place]) [J96.01, J96.02]  Patient had her last dialysis Friday. Achieved her estimated dry weight of 58.5kg. She developed shortness of breath last night and presented to ED.   Patient states she quit smoking two weeks ago.   Objective:  Vital signs in last 24 hours:  Temp:  [97.8 F (36.6 C)-98.6 F (37 C)] 98.6 F (37 C) (11/11 0530) Pulse Rate:  [66-117] 82 (11/11 0900) Resp:  [13-40] 24 (11/11 0900) BP: (122-227)/(69-148) 169/106 (11/11 0900) SpO2:  [85 %-100 %] 85 % (11/11 0900) Weight:  [59 kg (130 lb)-61.1 kg (134 lb 11.2 oz)] 61.1 kg (134 lb 11.2 oz) (11/11 0225)  Weight change:  Filed Weights   10/05/17 1916 10/06/17 0225  Weight: 59 kg (130 lb) 61.1 kg (134 lb 11.2 oz)    Intake/Output: I/O last 3 completed shifts: In: 103 [I.V.:3; IV Piggyback:100] Out: 2500 [Other:2500]   Intake/Output this shift:  No intake/output data recorded.  Physical Exam: General: NAD, sitting up in bed  Head: Normocephalic, atraumatic. Moist oral mucosal membranes  Eyes: Anicteric, PERRL  Neck: Supple, trachea midline  Lungs:  Clear to auscultation  Heart: Regular rate and rhythm  Abdomen:  Soft, nontender  Extremities:  no peripheral edema.  Neurologic: Nonfocal, moving all four extremities  Skin: No lesions  Access: RIJ permcath, left arm AVF    Basic Metabolic Panel: Recent Labs  Lab 10/05/17 1922 10/05/17 2040 10/06/17 0115 10/06/17 0754  NA 138 138  --  137  K 7.4* 7.0*  --  5.0  CL 103 100*  --  96*  CO2 23 26  --  28  GLUCOSE 228* 223*  --  138*  BUN 55* 59*  --  29*  CREATININE 6.72* 6.76*  --  4.33*  CALCIUM 8.4* 8.1*  --  8.8*  PHOS  --   --  3.6  --     Liver Function Tests: No results for input(s):  AST, ALT, ALKPHOS, BILITOT, PROT, ALBUMIN in the last 168 hours. No results for input(s): LIPASE, AMYLASE in the last 168 hours. No results for input(s): AMMONIA in the last 168 hours.  CBC: Recent Labs  Lab 10/05/17 1922 10/06/17 0754  WBC 11.8* 7.0  NEUTROABS 7.4*  --   HGB 11.3* 10.5*  HCT 37.6 33.6*  MCV 106.0* 100.8*  PLT 294 248    Cardiac Enzymes: Recent Labs  Lab 10/05/17 1922 10/06/17 0115 10/06/17 0754  TROPONINI 0.05* 0.08* 0.09*    BNP: Invalid input(s): POCBNP  CBG: Recent Labs  Lab 10/05/17 2327 10/05/17 2357 10/06/17 0023 10/06/17 0813  GLUCAP 103* 102* 111* 127*    Microbiology: Results for orders placed or performed during the hospital encounter of 10/05/17  MRSA PCR Screening     Status: Abnormal   Collection Time: 10/06/17 12:00 AM  Result Value Ref Range Status   MRSA by PCR POSITIVE (A) NEGATIVE Final    Comment:        The GeneXpert MRSA Assay (FDA approved for NASAL specimens only), is one component of a comprehensive MRSA colonization surveillance program. It is not intended to diagnose MRSA infection nor to guide or monitor treatment for MRSA infections. RESULT CALLED TO, READ BACK BY AND VERIFIED WITH: ERICA TAYLOR AT 0204 10/06/17.PMH  Coagulation Studies: No results for input(s): LABPROT, INR in the last 72 hours.  Urinalysis: No results for input(s): COLORURINE, LABSPEC, PHURINE, GLUCOSEU, HGBUR, BILIRUBINUR, KETONESUR, PROTEINUR, UROBILINOGEN, NITRITE, LEUKOCYTESUR in the last 72 hours.  Invalid input(s): APPERANCEUR    Imaging: Dg Chest Portable 1 View  Result Date: 10/05/2017 CLINICAL DATA:  Dyspnea EXAM: PORTABLE CHEST 1 VIEW COMPARISON:  08/11/2017 chest radiograph. FINDINGS: Right internal jugular central venous catheter terminates in the middle third of the superior vena cava. Stable cardiomediastinal silhouette with mild cardiomegaly and aortic atherosclerosis. No pneumothorax. Trace bilateral pleural  effusions. Hazy and linear parahilar lung opacities in both lungs, compatible with mild-to-moderate pulmonary edema. IMPRESSION: Mild-to-moderate congestive heart failure with trace bilateral pleural effusions. Electronically Signed   By: Ilona Sorrel M.D.   On: 10/05/2017 19:53     Medications:   . sodium chloride     . budesonide (PULMICORT) nebulizer solution  0.5 mg Nebulization BID  . furosemide  40 mg Intravenous TID  . heparin  5,000 Units Subcutaneous Q8H  . insulin aspart  0-15 Units Subcutaneous TID WC  . insulin aspart  0-5 Units Subcutaneous QHS  . methylPREDNISolone (SOLU-MEDROL) injection  60 mg Intravenous Q6H  . nicotine  14 mg Transdermal Daily  . pantoprazole (PROTONIX) IV  40 mg Intravenous Q24H  . sodium chloride flush  3 mL Intravenous Q12H   sodium chloride, acetaminophen **OR** acetaminophen, HYDROcodone-acetaminophen, ipratropium-albuterol, metoprolol tartrate, ondansetron **OR** ondansetron (ZOFRAN) IV, polyethylene glycol, sodium chloride flush  Assessment/ Plan:  [name] is a 61 y.o. black female with end stage renal disease on hemodialysis, hypertension, congestive heart failure, COPD/tobacco abuse, diabetes mellitus type II, depression, obstructive sleep apnea, peripheral vascular disease, who was admitted 10/05/2017 on Hyperkalemia [E87.5] Acute pulmonary edema (HCC) [J81.0] COPD exacerbation (HCC) [J44.1] Acute respiratory failure with hypoxia and hypercapnia ([place]) [J96.01, J96.02]  MWF [place].   1. End Stage Renal Disease with hyperkalemia, pulmonary edema/respiratory failure requiring noninvasive ventilation:  Emergent hemodialysis last night. 2K bath. UF of 2.5 liters. Through RIJ permcath.  Now breathing [place] Potassium corrected.  Discussed low potassium diet with patient. Discussed fluid restriction with patient.  No acute indication for dialysis today.  Resume MWF schedule. Next treatment for tomorrow.   2. Hypertension:  hypertensive emergency on admission. Volume driven.  - Resume home regimen of carvedilol, amlodipine, losartan, furosemide - Switch furosemide back to PO.   3. Anemia of chronic kidney disease: hemoglobin 10.5. Macrocytic.  - EPO on MWF HD treatments.  - Discuss vitamin B12 and folate levels with outpatient dialysis dietician.   4. Secondary Hyperparathyroidism: outpatient labs from 10/8 show PTH elevated at 607. Phosphorus and corrected calcium at goal.  - restart home dose of cinacalcet - calcium acetate with meals.     LOS: [place], Fernando Salinas 11/11/20189:37 AM

## 2017-10-06 NOTE — Progress Notes (Signed)
Patient is alert and orientation is questionable. Patient is not cooperative in answering most questions. Patient denies pain. Patient received dialysis and it was completed around 0615. Per dialysis RN, 2.5L was removed. Patient removed her Bi-Pap briefly to call her husband.

## 2017-10-06 NOTE — Progress Notes (Signed)
[place] [place] [facility] [place] NAME: Michele Nash    MR#:  [id number]  DATE OF BIRTH:  07/05/1956  SUBJECTIVE:  CHIEF COMPLAINT:   Chief Complaint  Patient presents with  . Respiratory Distress    came with respi distress, had fluid overload and Hyperkalemia- urgent HD done last night. Much improved now.  REVIEW OF SYSTEMS:   CONSTITUTIONAL: No fever, +fatigue/weakness.  EYES: No blurred or double vision.  EARS, NOSE, AND THROAT: No tinnitus or ear pain.  RESPIRATORY: + cough/shortness of breath,no wheezing or hemoptysis.  CARDIOVASCULAR: No chest pain, orthopnea, right leg edema.  GASTROINTESTINAL: No nausea, vomiting, diarrhea or abdominal pain.  GENITOURINARY: No dysuria, hematuria.  ENDOCRINE: No polyuria, nocturia,  HEMATOLOGY: No anemia, easy bruising or bleeding SKIN: No rash or lesion. MUSCULOSKELETAL: No joint pain or arthritis.   NEUROLOGIC: No tingling, numbness, weakness.  PSYCHIATRY: No anxiety or depression.      ROS  DRUG ALLERGIES:   Allergies  Allergen Reactions  . Sulfa Antibiotics Itching, Swelling, Rash and Other (See Comments)    Reaction:  Facial/body swelling     VITALS:  Blood pressure (!) 157/77, pulse 91, temperature 98.8 F (37.1 C), temperature source Oral, resp. rate 18, height 5\' 7"  (1.702 m), weight 61.1 kg (134 lb 11.2 oz), SpO2 100 %.  PHYSICAL EXAMINATION:  GENERAL:  61 y.o.-year-old patient lying in the bed with no acute distress.  EYES: Pupils equal, round, reactive to light and accommodation. No scleral icterus. Extraocular muscles intact.  HEENT: Head atraumatic, normocephalic. Oropharynx and nasopharynx clear.  NECK:  Supple, no jugular venous distention. No thyroid enlargement, no tenderness.  LUNGS: Normal breath sounds bilaterally, minimal wheezing, no crepitation. No use of accessory muscles of respiration. On supplemental oxygen. CARDIOVASCULAR: S1, S2 normal. No murmurs, rubs, or gallops.   ABDOMEN: Soft, nontender, nondistended. Bowel sounds present. No organomegaly or mass.  EXTREMITIES: No pedal edema, cyanosis, or clubbing.  NEUROLOGIC: Cranial nerves II through XII are intact. Muscle strength 5/5 in all extremities. Sensation intact. Gait not checked.  PSYCHIATRIC: The patient is alert and oriented x 3.  SKIN: No obvious rash, lesion, or ulcer.   Physical Exam LABORATORY PANEL:   CBC Recent Labs  Lab 10/06/17 0754  WBC 7.0  HGB 10.5*  HCT 33.6*  PLT 248   ------------------------------------------------------------------------------------------------------------------  Chemistries  Recent Labs  Lab 10/06/17 0754  NA 137  K 5.0  CL 96*  CO2 28  GLUCOSE 138*  BUN 29*  CREATININE 4.33*  CALCIUM 8.8*   ------------------------------------------------------------------------------------------------------------------  Cardiac Enzymes Recent Labs  Lab 10/06/17 0115 10/06/17 0754  TROPONINI 0.08* 0.09*   ------------------------------------------------------------------------------------------------------------------  RADIOLOGY:  Dg Chest Portable 1 View  Result Date: 10/05/2017 CLINICAL DATA:  Dyspnea EXAM: PORTABLE CHEST 1 VIEW COMPARISON:  08/11/2017 chest radiograph. FINDINGS: Right internal jugular central venous catheter terminates in the middle third of the superior vena cava. Stable cardiomediastinal silhouette with mild cardiomegaly and aortic atherosclerosis. No pneumothorax. Trace bilateral pleural effusions. Hazy and linear parahilar lung opacities in both lungs, compatible with mild-to-moderate pulmonary edema. IMPRESSION: Mild-to-moderate congestive heart failure with trace bilateral pleural effusions. Electronically Signed   By: Ilona Sorrel M.D.   On: 10/05/2017 19:53    ASSESSMENT AND PLAN:   Active Problems:   Acute respiratory failure (HCC)   1 acute on chronic recurrent back/hypoxic respiratory failure Most likely secondary to  multifactorial process which includes acute on chronic diastolic congestive heart failure exacerbation, acute fluid overload state  with end-stage renal disease on hemodialysis, and probable COPD exacerbation Admitted to ICU,  Appreciated help by nephrology - urgent hemodialysis  supplemental oxygen as needed, IV Lasix twice daily, strict I&O monitoring, daily weights, rule out acute coronary syndrome with cardiac enzymes x3 sets, IV Solu-Medrol with tapering as tolerated, inhaled corticosteroids twice daily, aggressive pulmonary toilet with bronchodilator therapy, follow-up on cultures, and continue close medical monitoring  Much improved now.  2 acute on chronic diastolic congestive heart failure exacerbation Compounded by acute fluid overload state and COPD exacerbation As above.  3 acute fluid overload state- Hyperkalemia Secondary to end-stage renal disease-on hemodialysis Mondays/Wednesdays/Fridays  Both improved after HD.  4 acute on COPD exacerbation Plan of care as stated above  No much wheezing, stop IV steroids now.  5 acute on chronic hypoxic/hypercarbic respiratory failure Improving now.  6 history of obstructive sleep apnea On CPAP at home  7 chronic tobacco smoking abuse/dependency Patient states that she quit 2 weeks ago, yet smells like smoke Nicotine patch daily and cessation counseling ordered Time spent for counceling is 4 min.  8 chronic GERD without esophagitis PPI daily  9 chronic diabetes mellitus type 2 Sliding scale insulin and Accu-Cheks per routine for now    All the records are reviewed and case discussed with Care Management/Social Workerr. Management plans discussed with the patient, family and they are in agreement.  CODE STATUS: Full.  TOTAL TIME TAKING CARE OF THIS PATIENT: 35 minutes.   POSSIBLE D/C IN 1-2 DAYS, DEPENDING ON CLINICAL CONDITION.   Vaughan Basta M.D on 10/06/2017   Between 7am to 6pm - Pager -  [phone]  After 6pm go to [email or web address] - password [email or web address] [place]  Office  [phone]  CC: Primary care physician; [name], MD  Note: This dictation was prepared with Dragon dictation along with smaller phrase technology. Any transcriptional errors that result from this process are unintentional.

## 2017-10-06 NOTE — Progress Notes (Signed)
This note also relates to the following rows which could not be included: Pulse Rate - Cannot attach notes to unvalidated device data BP - Cannot attach notes to unvalidated device data  Hd completed

## 2017-10-06 NOTE — Progress Notes (Signed)
Post dialysis assessment 

## 2017-10-06 NOTE — Discharge Instructions (Signed)
Heart Failure Clinic appointment on October 22 2017 at 1:20pm with Darylene Price, [place]. Please call [phone] to reschedule.

## 2017-10-06 NOTE — Progress Notes (Signed)
Pre dialysis assessment 

## 2017-10-07 LAB — BLOOD GAS, VENOUS
Acid-base deficit: 3.9 mmol/L — ABNORMAL HIGH (ref 0.0–2.0)
Bicarbonate: 29.3 mmol/L — ABNORMAL HIGH (ref 20.0–28.0)
PATIENT TEMPERATURE: 37
PH VEN: 7.01 — AB (ref 7.250–7.430)
pCO2, Ven: 116 mmHg (ref 44.0–60.0)

## 2017-10-07 LAB — GLUCOSE, CAPILLARY
GLUCOSE-CAPILLARY: 173 mg/dL — AB (ref 65–99)
Glucose-Capillary: 181 mg/dL — ABNORMAL HIGH (ref 65–99)

## 2017-10-07 MED ORDER — LIDOCAINE-PRILOCAINE 2.5-2.5 % EX CREA
TOPICAL_CREAM | Freq: Once | CUTANEOUS | Status: DC
  Filled 2017-10-07: qty 5

## 2017-10-07 MED ORDER — ALBUTEROL SULFATE HFA 108 (90 BASE) MCG/ACT IN AERS: 2.0000 | INHALATION_SPRAY | Freq: Four times a day (QID) | RESPIRATORY_TRACT | 1 refills | Status: AC | PRN

## 2017-10-07 NOTE — Progress Notes (Signed)
10/07/2017  3:56 PM  Michele Nash to be D/C'd Home per MD order.  Discussed prescriptions and follow up appointments with the patient. Prescriptions given to patient, medication list explained in detail. Pt verbalized understanding.  Allergies as of 10/07/2017      Reactions   Sulfa Antibiotics Itching, Swelling, Rash, Other (See Comments)   Reaction:  Facial/body swelling       Medication List    TAKE these medications   albuterol 108 (90 Base) MCG/ACT inhaler Commonly known as:  PROVENTIL HFA;VENTOLIN HFA Inhale 2 puffs every 6 (six) hours as needed into the lungs for wheezing or shortness of breath.   ALPRAZolam 0.5 MG tablet Commonly known as:  XANAX Take 0.5 mg by mouth 2 (two) times daily.   amLODipine 10 MG tablet Commonly known as:  NORVASC Take 10 mg by mouth daily.   calcium acetate 667 MG capsule Commonly known as:  PHOSLO Take 3 capsules (2,001 mg total) by mouth 3 (three) times daily with meals.   carvedilol 12.5 MG tablet Commonly known as:  COREG Take 12.5 mg by mouth 2 (two) times daily with a meal.   cinacalcet 30 MG tablet Commonly known as:  SENSIPAR Take 60 mg by mouth daily.   ezetimibe 10 MG tablet Commonly known as:  ZETIA Take 10 mg by mouth daily.   feeding supplement (PRO-STAT SUGAR FREE 64) Liqd Take 30 mLs by mouth daily.   fluticasone 50 MCG/ACT nasal spray Commonly known as:  FLONASE Place 2 sprays into both nostrils daily as needed for rhinitis.   furosemide 40 MG tablet Commonly known as:  LASIX Take 40 mg by mouth daily.   hydrALAZINE 50 MG tablet Commonly known as:  APRESOLINE Take 1 tablet (50 mg total) by mouth 3 (three) times daily.   hydrochlorothiazide 25 MG tablet Commonly known as:  HYDRODIURIL Take 1 tablet (25 mg total) by mouth daily.   ipratropium-albuterol 0.5-2.5 (3) MG/3ML Soln Commonly known as:  DUONEB Take 3 mLs by nebulization every 6 (six) hours as needed.   lidocaine-prilocaine cream Commonly  known as:  EMLA Apply 1 application topically as needed (prior to accessing port).   losartan 100 MG tablet Commonly known as:  COZAAR Take 100 mg by mouth daily.   multivitamin Tabs tablet Take 1 tablet by mouth at bedtime.   ondansetron 4 MG tablet Commonly known as:  ZOFRAN Take 1 tablet (4 mg total) by mouth every 8 (eight) hours as needed for nausea or vomiting.   pantoprazole 40 MG tablet Commonly known as:  PROTONIX Take 40 mg by mouth 2 (two) times daily.   sennosides-docusate sodium 8.6-50 MG tablet Commonly known as:  SENOKOT-S Take 1 tablet by mouth at bedtime as needed for constipation.   sertraline 50 MG tablet Commonly known as:  ZOLOFT Take 50 mg by mouth daily.   traMADol 50 MG tablet Commonly known as:  ULTRAM Take 1 tablet (50 mg total) by mouth every 12 (twelve) hours as needed for severe pain.       Vitals:   10/07/17 1246 10/07/17 1316  BP: (!) 169/96 (!) 169/91  Pulse: 73 73  Resp: 20 (!) 22  Temp:  99 F (37.2 C)  SpO2: 100% 97%    Skin clean, dry and intact without evidence of skin break down, no evidence of skin tears noted. IV catheter discontinued intact. Site without signs and symptoms of complications. Dressing and pressure applied. Pt denies pain at this time. No complaints  noted.  An After Visit Summary was printed and given to the patient. Patient escorted via [place], and D/C home via private auto.  Dola Argyle

## 2017-10-07 NOTE — Progress Notes (Signed)
HD initiated via 1:1 using AVF and HD catheter. AVF prepped with lidocaine prior to cannulating and using 17g needles per patient request. Dr. Candiss Norse aware. Patient currently has no complaints. Lungs diminished with fine crackles. 3L oxygen with sats >96%. 2.5L goal as ordered. Continue to monitor patient. Labs sent.

## 2017-10-07 NOTE — Progress Notes (Signed)
HD completed without issue. Patient tolerated well. Goal met  

## 2017-10-07 NOTE — Progress Notes (Signed)
Pre hd 

## 2017-10-07 NOTE — Care Management (Signed)
[name] HD liaison notified of admission.  Patient Michele Nash.

## 2017-10-07 NOTE — Discharge Summary (Signed)
[facility] NAME: Michele Nash    MR#:  [id number]  DATE OF BIRTH:  06-18-1956  DATE OF ADMISSION:  10/05/2017 ADMITTING PHYSICIAN: Gorden Harms, MD  DATE OF DISCHARGE: 10/07/2017  PRIMARY CARE PHYSICIAN: [name], MD    ADMISSION DIAGNOSIS:  Hyperkalemia [E87.5] Acute pulmonary edema (HCC) [J81.0] COPD exacerbation ([place]) [J44.1] Acute respiratory failure with hypoxia and hypercapnia ([place]) [J96.01, J96.02]  DISCHARGE DIAGNOSIS:  Active Problems:   Acute respiratory failure ([place])   SECONDARY DIAGNOSIS:   Past Medical History:  Diagnosis Date  . Altered mental status   . Anemia   . Apnea, sleep    for sleep study 10/17/15-no cpap yet  . Arthritis   . Bronchitis   . CHF (congestive heart failure) ([place])   . Chronic kidney disease   . COPD (chronic obstructive pulmonary disease) ([place])   . Depression   . Dialysis patient ([place]) 2014  . GERD (gastroesophageal reflux disease)   . GIB (gastrointestinal bleeding) 07/09/2016  . Headache   . Hyperlipidemia   . Hypertension   . Obesity   . Pulmonary edema 09/14/2016  . Renal dialysis device, implant, or graft complication    RIGHT CHEST CATH  . Respiratory failure ([place]) 07/23/2016  . Traumatic hematoma of forehead   . Type II diabetes mellitus ([place]) 09/21/2009   diet controlled    HOSPITAL COURSE:   1acute on chronic recurrent back/hypoxic respiratory failure Most likely secondary to multifactorial process which includes acute on chronic diastolic congestive heart failure exacerbation, acute fluid overload state with end-stage renal disease on hemodialysis, and probable COPD exacerbation Admitted to ICU,  Appreciated help by nephrology - urgent hemodialysis  supplemental oxygen as needed, IV Lasix twice daily, strict I&O monitoring, daily weights, rule out acute coronary syndrome with cardiac enzymes x3 sets, IV Solu-Medrol with tapering as tolerated,  inhaled corticosteroids twice daily, aggressive pulmonary toilet with bronchodilator therapy, follow-up on cultures, and continue close medical monitoring  Much improved now.  2acute on chronic diastolic congestive heart failure exacerbation Compounded by acute fluid overload state and COPD exacerbation As above.  3acute fluid overload state- Hyperkalemia Secondary to end-stage renal disease-on hemodialysis Mondays/Wednesdays/Fridays  Both improved after HD.  4acute on COPD exacerbation Plan of care as stated above  No much wheezing, stop IV steroids now.  5acute on chronic hypoxic/hypercarbic respiratory failure Improving now.  6history of obstructive sleep apnea On CPAP at home  7chronic tobacco smoking abuse/dependency Patient states that she quit 2 weeks ago, yet smells like smoke Nicotine patch daily and cessation counseling ordered Time spent for counceling is 4 min.  8chronic GERD without esophagitis PPI daily  9chronic diabetes mellitus type 2 Sliding scale insulin and Accu-Cheks per routine for now   DISCHARGE CONDITIONS:   Stable.  CONSULTS OBTAINED:  Treatment Team:  [name], MD  DRUG ALLERGIES:   Allergies  Allergen Reactions  . Sulfa Antibiotics Itching, Swelling, Rash and Other (See Comments)    Reaction:  Facial/body swelling     DISCHARGE MEDICATIONS:   Current Discharge Medication List    CONTINUE these medications which have NOT CHANGED   Details  albuterol (PROVENTIL HFA;VENTOLIN HFA) 108 (90 Base) MCG/ACT inhaler Inhale 2 puffs into the lungs every 6 (six) hours as needed for wheezing or shortness of breath. Qty: 1 Inhaler, Refills: 1    ALPRAZolam (XANAX) 0.5 MG tablet Take 0.5 mg by mouth 2 (two) times daily.    amLODipine (  NORVASC) 10 MG tablet Take 10 mg by mouth daily.     calcium acetate (PHOSLO) 667 MG capsule Take 3 capsules (2,001 mg total) by mouth 3 (three) times daily with meals. Qty: 270 capsule,  Refills: 0    carvedilol (COREG) 12.5 MG tablet Take 12.5 mg by mouth 2 (two) times daily with a meal.    cinacalcet (SENSIPAR) 30 MG tablet Take 60 mg by mouth daily.     ezetimibe (ZETIA) 10 MG tablet Take 10 mg by mouth daily.     fluticasone (FLONASE) 50 MCG/ACT nasal spray Place 2 sprays into both nostrils daily as needed for rhinitis.    furosemide (LASIX) 40 MG tablet Take 40 mg by mouth daily.     hydrALAZINE (APRESOLINE) 50 MG tablet Take 1 tablet (50 mg total) by mouth 3 (three) times daily. Qty: 90 tablet, Refills: 0    hydrochlorothiazide (HYDRODIURIL) 25 MG tablet Take 1 tablet (25 mg total) by mouth daily. Qty: 15 tablet, Refills: 0    ipratropium-albuterol (DUONEB) 0.5-2.5 (3) MG/3ML SOLN Take 3 mLs by nebulization every 6 (six) hours as needed. Qty: 360 mL, Refills: 0    lidocaine-prilocaine (EMLA) cream Apply 1 application topically as needed (prior to accessing port).    losartan (COZAAR) 100 MG tablet Take 100 mg by mouth daily.     multivitamin (RENA-VIT) TABS tablet Take 1 tablet by mouth at bedtime.     ondansetron (ZOFRAN) 4 MG tablet Take 1 tablet (4 mg total) by mouth every 8 (eight) hours as needed for nausea or vomiting. Qty: 10 tablet, Refills: 0    pantoprazole (PROTONIX) 40 MG tablet Take 40 mg by mouth 2 (two) times daily.    sennosides-docusate sodium (SENOKOT-S) 8.6-50 MG tablet Take 1 tablet by mouth at bedtime as needed for constipation.    sertraline (ZOLOFT) 50 MG tablet Take 50 mg by mouth daily.     traMADol (ULTRAM) 50 MG tablet Take 1 tablet (50 mg total) by mouth every 12 (twelve) hours as needed for severe pain. Qty: 20 tablet, Refills: 0    Amino Acids-Protein Hydrolys (FEEDING SUPPLEMENT, PRO-STAT SUGAR FREE 64,) LIQD Take 30 mLs by mouth daily. Qty: 900 mL, Refills: 0         DISCHARGE INSTRUCTIONS:    Follow with PMD in 1-2 weeks.  If you experience worsening of your admission symptoms, develop shortness of breath, life  threatening emergency, suicidal or homicidal thoughts you must seek medical attention immediately by calling 911 or calling your MD immediately  if symptoms less severe.  You Must read complete instructions/literature along with all the possible adverse reactions/side effects for all the Medicines you take and that have been prescribed to you. Take any new Medicines after you have completely understood and accept all the possible adverse reactions/side effects.   Please note  You were cared for by a hospitalist during your hospital stay. If you have any questions about your discharge medications or the care you received while you were in the hospital after you are discharged, you can call the unit and asked to speak with the hospitalist on call if the hospitalist that took care of you is not available. Once you are discharged, your primary care physician will handle any further medical issues. Please note that NO REFILLS for any discharge medications will be authorized once you are discharged, as it is imperative that you return to your primary care physician (or establish a relationship with a primary care physician if  you do not have one) for your aftercare needs so that they can reassess your need for medications and monitor your lab values.    Today   CHIEF COMPLAINT:   Chief Complaint  Patient presents with  . Respiratory Distress    HISTORY OF PRESENT ILLNESS:  Michele Nash  is a 61 y.o. female presenting to the emergency room with acute shortness of breath, worsening over the last 1-2 days, noted O2 saturation in the 60s by EMS-treated with breathing treatments/Solu-Medrol/CPAP, in the emergency room patient was placed on BiPAP immediately for acute COPD and fluid overload from CHF, noted severe respiratory acidosis with pH of 7.0, PCO2 of 116, BNP 350, potassium 7.4, creatinine 6.7-has end-stage renal disease for which patient receives hemodialysis Monday/Wednesday/Fridays, glucose 228,  EKG noted for tachycardia heart rate 118/anterior infarct/lateral ST segment depression, white count 11.8, patient evaluated in the emergency room, in moderate respiratory distress, inability to speak in full sentences, noted JVD, diffuse Rales/severely diminished breath sounds bilaterally, patient is now been admitted for acute fluid overload state, acute on chronic congestive heart failure exacerbation, and COPD.   VITAL SIGNS:  Blood pressure (!) 181/122, pulse 65, temperature (!) 97.3 F (36.3 C), temperature source Axillary, resp. rate 20, height 5\' 7"  (1.702 m), weight 63 kg (138 lb 14.2 oz), SpO2 100 %.  I/O:    Intake/Output Summary (Last 24 hours) at 10/07/2017 1233 Last data filed at 10/06/2017 1841 Gross per 24 hour  Intake 960 ml  Output -  Net 960 ml    PHYSICAL EXAMINATION:  GENERAL:  61 y.o.-year-old patient lying in the bed with no acute distress.  EYES: Pupils equal, round, reactive to light and accommodation. No scleral icterus. Extraocular muscles intact.  HEENT: Head atraumatic, normocephalic. Oropharynx and nasopharynx clear.  NECK:  Supple, no jugular venous distention. No thyroid enlargement, no tenderness.  LUNGS: Normal breath sounds bilaterally, no wheezing, rales,rhonchi or crepitation. No use of accessory muscles of respiration.  CARDIOVASCULAR: S1, S2 normal. No murmurs, rubs, or gallops.  ABDOMEN: Soft, non-tender, non-distended. Bowel sounds present. No organomegaly or mass.  EXTREMITIES: No pedal edema, cyanosis, or clubbing.  NEUROLOGIC: Cranial nerves II through XII are intact. Muscle strength 5/5 in all extremities. Sensation intact. Gait not checked.  PSYCHIATRIC: The patient is alert and oriented x 3.  SKIN: No obvious rash, lesion, or ulcer.   DATA REVIEW:   CBC Recent Labs  Lab 10/06/17 0754  WBC 7.0  HGB 10.5*  HCT 33.6*  PLT 248    Chemistries  Recent Labs  Lab 10/06/17 0754  NA 137  K 5.0  CL 96*  CO2 28  GLUCOSE 138*  BUN  29*  CREATININE 4.33*  CALCIUM 8.8*    Cardiac Enzymes Recent Labs  Lab 10/06/17 1323  TROPONINI 0.08*    Microbiology Results  Results for orders placed or performed during the hospital encounter of 10/05/17  MRSA PCR Screening     Status: Abnormal   Collection Time: 10/06/17 12:00 AM  Result Value Ref Range Status   MRSA by PCR POSITIVE (A) NEGATIVE Final    Comment:        The GeneXpert MRSA Assay (FDA approved for NASAL specimens only), is one component of a comprehensive MRSA colonization surveillance program. It is not intended to diagnose MRSA infection nor to guide or monitor treatment for MRSA infections. RESULT CALLED TO, READ BACK BY AND VERIFIED WITH: ERICA TAYLOR AT 0204 10/06/17.PMH     RADIOLOGY:  Dg Chest  Portable 1 View  Result Date: 10/05/2017 CLINICAL DATA:  Dyspnea EXAM: PORTABLE CHEST 1 VIEW COMPARISON:  08/11/2017 chest radiograph. FINDINGS: Right internal jugular central venous catheter terminates in the middle third of the superior vena cava. Stable cardiomediastinal silhouette with mild cardiomegaly and aortic atherosclerosis. No pneumothorax. Trace bilateral pleural effusions. Hazy and linear parahilar lung opacities in both lungs, compatible with mild-to-moderate pulmonary edema. IMPRESSION: Mild-to-moderate congestive heart failure with trace bilateral pleural effusions. Electronically Signed   By: Ilona Sorrel M.D.   On: 10/05/2017 19:53    EKG:   Orders placed or performed during the hospital encounter of 10/05/17  . EKG 12-Lead  . EKG 12-Lead      Management plans discussed with the patient, family and they are in agreement.  CODE STATUS:     Code Status Orders  (From admission, onward)        Start     Ordered   10/06/17 0000  Full code  Continuous     10/05/17 2359    Code Status History    Date Active Date Inactive Code Status Order ID Comments User Context   08/11/2017 17:56 08/13/2017 16:15 Full Code [id number]   Vaughan Basta, MD Inpatient   06/15/2017 19:42 06/20/2017 16:52 Full Code [id number]  Idelle Crouch, MD Inpatient   05/21/2017 17:58 05/22/2017 17:24 Full Code [id number]  Nicholes Mango, MD Inpatient   12/04/2016 09:55 12/11/2016 17:41 Full Code [id number]  Fritzi Mandes, MD Inpatient   10/29/2016 12:14 10/30/2016 19:43 Full Code [id number]  Laverle Hobby, MD ED   10/06/2016 20:33 10/11/2016 15:54 Full Code [id number]  Mikael Spray, NP ED   09/14/2016 09:19 09/16/2016 16:07 Full Code [id number]  Laverle Hobby, MD ED   07/23/2016 11:05 07/24/2016 22:49 Full Code [id number]  Wilhelmina Mcardle, MD ED   07/09/2016 18:43 07/13/2016 17:23 Full Code [id number]  Lytle Butte, MD ED   05/28/2016 14:54 06/02/2016 14:51 Full Code [id number]  Theodoro Grist, MD Inpatient   04/25/2016 18:04 04/26/2016 21:17 Full Code [id number]  Nicholes Mango, MD Inpatient   10/28/2015 18:50 10/30/2015 18:09 Full Code [id number]  Theodoro Grist, MD Inpatient   03/30/2015 00:51 03/31/2015 16:44 Full Code [id number]  Juluis Mire, MD Inpatient      TOTAL TIME TAKING CARE OF THIS PATIENT: **35* minutes.    Vaughan Basta M.D on 10/07/2017 at 12:33 PM  Between 7am to 6pm - Pager - [phone]  After 6pm go to [email or web address] - password [email or web address] [facility]  Office  [phone]  CC: Primary care physician; [name], MD   Note: This dictation was prepared with Dragon dictation along with smaller phrase technology. Any transcriptional errors that result from this process are unintentional.

## 2017-10-07 NOTE — Care Management (Signed)
Patient admitted for respiratory failures.  Lives at home with husband.  PCP Jadali.  Patient has chronic home O2.  Husband to bring portable tank for discharge.  RNCM met with patient to discuss home health nursing for frequent admission, and management of COPD and CHF.  Patient declines services this admission, as she also has on previous admission. RNCM signing off.

## 2017-10-07 NOTE — Care Management Important Message (Signed)
Important Message  Patient Details  Name: Michele Nash MRN: [id number] Date of Birth: 06-Jan-1956   Medicare Important Message Given:  N/A - LOS <3 / Initial given by admissions    Beverly Sessions, RN 10/07/2017, 2:09 PM

## 2017-10-07 NOTE — Progress Notes (Signed)
[facility]  ROUNDING NOTE   Subjective:   [name] admitted to [facility] on 10/05/2017 for Hyperkalemia [E87.5] Acute pulmonary edema (HCC) [J81.0] COPD exacerbation ([place]) [J44.1] Acute respiratory failure with hypoxia and hypercapnia ([place]) [J96.01, J96.02]  Patient had her last dialysis Friday. Achieved her estimated dry weight of 58.5kg.  She developed shortness of breath lon Saturday. She tries CPAP at home Couldn't breath at home;  Brought to ER for severe dyspnea  Found to have severe resp acidosis; hyperkalemia Emergent HD early sat midnight/Sunday early morning; 2.5 L removed    HEMODIALYSIS FLOWSHEET:  Blood Flow Rate (mL/min): 300 mL/min Arterial Pressure (mmHg): -110 mmHg Venous Pressure (mmHg): 280 mmHg Transmembrane Pressure (mmHg): 70 mmHg Ultrafiltration Rate (mL/min): 1020 mL/min Dialysate Flow Rate (mL/min): 800 ml/min Conductivity: Machine : 14 Conductivity: Machine : 14 Dialysis Fluid Bolus: Normal Saline Bolus Amount (mL): 100 mL Dialysate Change: 2K      Objective:  Vital signs in last 24 hours:  Temp:  [97.3 F (36.3 C)-98.8 F (37.1 C)] 97.3 F (36.3 C) (11/12 0406) Pulse Rate:  [66-92] 66 (11/12 0406) Resp:  [18-25] 20 (11/12 0406) BP: (153-187)/(72-106) 167/79 (11/12 0406) SpO2:  [85 %-100 %] 100 % (11/12 0406)  Weight change:  Filed Weights   10/05/17 1916 10/06/17 0225  Weight: 59 kg (130 lb) 61.1 kg (134 lb 11.2 oz)    Intake/Output: I/O last 3 completed shifts: In: 0932 [P.O.:960; I.V.:3; IV Piggyback:100] Out: 2500 [Other:2500]   Intake/Output this shift:  No intake/output data recorded.  Physical Exam: General: NAD, sitting up in bed  Head: Normocephalic, atraumatic. Moist oral mucosal membranes  Eyes: Anicteric   Neck: Supple, trachea midline  Lungs:   Bilateral basilar crackles  Heart:  No rub or gallop  Abdomen:  Soft, nontender  Extremities:  no peripheral edema.  Neurologic: Nonfocal, moving  all four extremities  Skin: No lesions  Access: RIJ permcath, left arm aneurysmal AVF    Basic Metabolic Panel: Recent Labs  Lab 10/05/17 1922 10/05/17 2040 10/06/17 0115 10/06/17 0754  NA 138 138  --  137  K 7.4* 7.0*  --  5.0  CL 103 100*  --  96*  CO2 23 26  --  28  GLUCOSE 228* 223*  --  138*  BUN 55* 59*  --  29*  CREATININE 6.72* 6.76*  --  4.33*  CALCIUM 8.4* 8.1*  --  8.8*  PHOS  --   --  3.6  --     Liver Function Tests: No results for input(s): AST, ALT, ALKPHOS, BILITOT, PROT, ALBUMIN in the last 168 hours. No results for input(s): LIPASE, AMYLASE in the last 168 hours. No results for input(s): AMMONIA in the last 168 hours.  CBC: Recent Labs  Lab 10/05/17 1922 10/06/17 0754  WBC 11.8* 7.0  NEUTROABS 7.4*  --   HGB 11.3* 10.5*  HCT 37.6 33.6*  MCV 106.0* 100.8*  PLT 294 248    Cardiac Enzymes: Recent Labs  Lab 10/05/17 1922 10/06/17 0115 10/06/17 0754 10/06/17 1323  TROPONINI 0.05* 0.08* 0.09* 0.08*    BNP: Invalid input(s): POCBNP  CBG: Recent Labs  Lab 10/06/17 0813 10/06/17 1209 10/06/17 1628 10/06/17 2134 10/07/17 0752  GLUCAP 127* 419* 272* 191* 173*    Microbiology: Results for orders placed or performed during the hospital encounter of 10/05/17  MRSA PCR Screening     Status: Abnormal   Collection Time: 10/06/17 12:00 AM  Result Value Ref Range Status  MRSA by PCR POSITIVE (A) NEGATIVE Final    Comment:        The GeneXpert MRSA Assay (FDA approved for NASAL specimens only), is one component of a comprehensive MRSA colonization surveillance program. It is not intended to diagnose MRSA infection nor to guide or monitor treatment for MRSA infections. RESULT CALLED TO, READ BACK BY AND VERIFIED WITH: ERICA TAYLOR AT 0204 10/06/17.PMH     Coagulation Studies: No results for input(s): LABPROT, INR in the last 72 hours.  Urinalysis: No results for input(s): COLORURINE, LABSPEC, PHURINE, GLUCOSEU, HGBUR,  BILIRUBINUR, KETONESUR, PROTEINUR, UROBILINOGEN, NITRITE, LEUKOCYTESUR in the last 72 hours.  Invalid input(s): APPERANCEUR    Imaging: Dg Chest Portable 1 View  Result Date: 10/05/2017 CLINICAL DATA:  Dyspnea EXAM: PORTABLE CHEST 1 VIEW COMPARISON:  08/11/2017 chest radiograph. FINDINGS: Right internal jugular central venous catheter terminates in the middle third of the superior vena cava. Stable cardiomediastinal silhouette with mild cardiomegaly and aortic atherosclerosis. No pneumothorax. Trace bilateral pleural effusions. Hazy and linear parahilar lung opacities in both lungs, compatible with mild-to-moderate pulmonary edema. IMPRESSION: Mild-to-moderate congestive heart failure with trace bilateral pleural effusions. Electronically Signed   By: Ilona Sorrel M.D.   On: 10/05/2017 19:53     Medications:   . sodium chloride     . amLODipine  10 mg Oral Daily  . budesonide (PULMICORT) nebulizer solution  0.5 mg Nebulization BID  . calcium acetate  2,001 mg Oral TID WC  . carvedilol  12.5 mg Oral BID WC  . Chlorhexidine Gluconate Cloth  6 each Topical Q0600  . cinacalcet  60 mg Oral Q supper  . furosemide  40 mg Oral Daily  . heparin  5,000 Units Subcutaneous Q8H  . insulin aspart  0-15 Units Subcutaneous TID WC  . insulin aspart  0-5 Units Subcutaneous QHS  . losartan  100 mg Oral Daily  . mupirocin ointment  1 application Nasal BID  . nicotine  14 mg Transdermal Daily  . pantoprazole (PROTONIX) IV  40 mg Intravenous Q24H  . sodium chloride flush  3 mL Intravenous Q12H   sodium chloride, acetaminophen **OR** acetaminophen, HYDROcodone-acetaminophen, ipratropium-albuterol, metoprolol tartrate, ondansetron **OR** ondansetron (ZOFRAN) IV, polyethylene glycol, sodium chloride flush  Assessment/ Plan:  [name] is a 61 y.o. black female with end stage renal disease on hemodialysis, hypertension, congestive heart failure, COPD/tobacco abuse, diabetes mellitus type II,  depression, obstructive sleep apnea, peripheral vascular disease, who was admitted 10/05/2017 on Hyperkalemia [E87.5] Acute pulmonary edema (HCC) [J81.0] COPD exacerbation (HCC) [J44.1] Acute respiratory failure with hypoxia and hypercapnia ([place]) [J96.01, J96.02]  MWF [facility].   1. End Stage Renal Disease with hyperkalemia, pulmonary edema/respiratory failure requiring noninvasive ventilation:  Emergent hemodialysis Through RIJ permcath.  Now breathing [place] Potassium corrected.  Discussed low potassium diet with patient. Discussed fluid restriction with patient.  Routine hemodialysis today Goal fluid removal 2- 2.5 L as tolerated  2. Hypertension: hypertensive emergency on admission. Volume driven.  - Resume home regimen , continue diuretics  3. Anemia of chronic kidney disease: hemoglobin 10.5. Macrocytic.  - EPO on MWF HD treatments.   4. Secondary Hyperparathyroidism: outpatient labs from 10/8 show PTH elevated at 607. Phosphorus and corrected calcium at goal.  - restart home dose of cinacalcet - calcium acetate with meals.     LOS: 2 Carlise Stofer 11/12/20188:54 AM

## 2017-10-08 LAB — HEPATITIS B SURFACE ANTIGEN: HEP B S AG: NEGATIVE

## 2017-10-08 LAB — HEPATITIS B CORE ANTIBODY, TOTAL: HEP B C TOTAL AB: NEGATIVE

## 2017-10-08 LAB — HEPATITIS B SURFACE ANTIBODY,QUALITATIVE: HEP B S AB: REACTIVE

## 2017-10-09 NOTE — Progress Notes (Deleted)
* [facility]     Assessment and Plan:  AECOPD and volume overload-- now doing much better.  --Recently DC from hospital with steroid taper. Now doing better.  --Continue current regimen of advair once daily.  --Acute on chronic respiratory failure due to multiple factors. Continue oxygen.   --OSA  --Sleep study 10/17/15; AHI 60. --Not using CPAP, she did not like the electrodes in her head so did not go back for the titration; and refuses to go back for another sleep study.  --Will attempt auto-PAP if covered. If not will see if we can get an Home sleep study.   Nicotine Abuse.  --She has stopped smoking since getting out of the hospital.  --discussed the importance of continued smoking cessation, 3 min spent in discussion.   Acute on chronic respiratory failure. -Multifactorial due to COPD, volume overload, end-stage renal disease. -Continue oxygen at 3 L.  Date: 10/09/2017  MRN# [id number] Michele Nash 1956-08-29   Michele Nash is a 60 y.o. old female seen in follow up for chief complaint of  No chief complaint on file.    HPI:   This is a 62 year old African-American female with a past medical history of end-stage renal disease on dialysis Monday, Wednesdays and Fridays, obstructive sleep apnea on home O2 at 3 L, COPD, depression, and hypertension.  She was last seen in the office here in December 2017, approximately 1 year ago.  Over the past year she has had 6 admissions to the hospital, most recently this month, with acute COPD exacerbation.  Since getting out of the hospital she has been feeling well, she feels that the breathing has been doing well. She notes dyspnea with walking up steps or housework. She is using advair HFA 2 puffs once daily, and feels that it helps with her breathing but not sure.  She is using 3 L of oxygen at home, and take it with her when travelling.     She is not on cpap. She had a sleep study but did not like having  the electrodes in her hair so did not go back for the cpap titration study.   Sleep study 10/17/15; AHI 60.   Medication:   Outpatient Encounter Medications as of 10/10/2017  Medication Sig  . albuterol (PROVENTIL HFA;VENTOLIN HFA) 108 (90 Base) MCG/ACT inhaler Inhale 2 puffs every 6 (six) hours as needed into the lungs for wheezing or shortness of breath.  . ALPRAZolam (XANAX) 0.5 MG tablet Take 0.5 mg by mouth 2 (two) times daily.  . Amino Acids-Protein Hydrolys (FEEDING SUPPLEMENT, PRO-STAT SUGAR FREE 64,) LIQD Take 30 mLs by mouth daily.  Marland Kitchen amLODipine (NORVASC) 10 MG tablet Take 10 mg by mouth daily.   . calcium acetate (PHOSLO) 667 MG capsule Take 3 capsules (2,001 mg total) by mouth 3 (three) times daily with meals.  . carvedilol (COREG) 12.5 MG tablet Take 12.5 mg by mouth 2 (two) times daily with a meal.  . cinacalcet (SENSIPAR) 30 MG tablet Take 60 mg by mouth daily.   Marland Kitchen ezetimibe (ZETIA) 10 MG tablet Take 10 mg by mouth daily.   . fluticasone (FLONASE) 50 MCG/ACT nasal spray Place 2 sprays into both nostrils daily as needed for rhinitis.  . furosemide (LASIX) 40 MG tablet Take 40 mg by mouth daily.   . hydrALAZINE (APRESOLINE) 50 MG tablet Take 1 tablet (50 mg total) by mouth 3 (three) times daily.  . hydrochlorothiazide (HYDRODIURIL) 25 MG tablet Take 1 tablet (  25 mg total) by mouth daily.  Marland Kitchen ipratropium-albuterol (DUONEB) 0.5-2.5 (3) MG/3ML SOLN Take 3 mLs by nebulization every 6 (six) hours as needed.  . lidocaine-prilocaine (EMLA) cream Apply 1 application topically as needed (prior to accessing port).  . losartan (COZAAR) 100 MG tablet Take 100 mg by mouth daily.   . multivitamin (RENA-VIT) TABS tablet Take 1 tablet by mouth at bedtime.   . ondansetron (ZOFRAN) 4 MG tablet Take 1 tablet (4 mg total) by mouth every 8 (eight) hours as needed for nausea or vomiting.  . pantoprazole (PROTONIX) 40 MG tablet Take 40 mg by mouth 2 (two) times daily.  . sennosides-docusate sodium  (SENOKOT-S) 8.6-50 MG tablet Take 1 tablet by mouth at bedtime as needed for constipation.  . sertraline (ZOLOFT) 50 MG tablet Take 50 mg by mouth daily.   . traMADol (ULTRAM) 50 MG tablet Take 1 tablet (50 mg total) by mouth every 12 (twelve) hours as needed for severe pain.   No facility-administered encounter medications on file as of 10/10/2017.      Allergies:  Sulfa antibiotics  Review of Systems: Gen:  Denies  fever, sweats. HEENT: Denies blurred vision. Cvc:  No dizziness, chest pain or heaviness Resp:   Denies cough or sputum porduction. Gi: Denies swallowing difficulty, stomach pain. constipation, bowel incontinence Gu:  Denies bladder incontinence, burning urine Ext:   No Joint pain, stiffness. Skin: No skin rash, easy bruising. Endoc:  No polyuria, polydipsia. Psych: No depression, insomnia. Other:  All other systems were reviewed and found to be negative other than what is mentioned in the HPI.   Physical Examination:   VS: There were no vitals taken for this visit.  General Appearance: No distress  Neuro:without focal findings,  speech normal,  HEENT: PERRLA, EOM intact. Pulmonary: normal breath sounds, No wheezing.   CardiovascularNormal S1,S2.  No m/r/g.   Abdomen: Benign, Soft, non-tender. Renal:  No costovertebral tenderness  GU:  Not performed at this time. Endoc: No evident thyromegaly, no signs of acromegaly. Skin:   warm, no rash. Extremities: normal, no cyanosis, clubbing.   LABORATORY PANEL:   CBC Recent Labs  Lab 10/06/17 0754  WBC 7.0  HGB 10.5*  HCT 33.6*  PLT 248   ------------------------------------------------------------------------------------------------------------------  Chemistries  Recent Labs  Lab 10/06/17 0754  NA 137  K 5.0  CL 96*  CO2 28  GLUCOSE 138*  BUN 29*  CREATININE 4.33*  CALCIUM 8.8*    ------------------------------------------------------------------------------------------------------------------  Cardiac Enzymes Recent Labs  Lab 10/06/17 1323  TROPONINI 0.08*   ------------------------------------------------------------  RADIOLOGY:   No results found for this or any previous visit. Results for orders placed during the hospital encounter of 05/02/16  DG Chest 2 View   Narrative CLINICAL DATA:  61 year old female with history of shortness of breath and chest pain starting earlier this morning after waking. Pressure and heaviness in the chest.  EXAM: CHEST  2 VIEW  COMPARISON:  Chest x-ray 04/25/2016.  FINDINGS: Mild diffuse peribronchial cuffing. Lung volumes are within normal limits. No acute consolidative airspace disease. No pleural effusions. No pneumothorax. No definite suspicious appearing pulmonary nodules or masses. No cephalization of the pulmonary vasculature. Mild cardiomegaly. The patient is rotated to the right on today's exam, resulting in distortion of the mediastinal contours and reduced diagnostic sensitivity and specificity for mediastinal pathology. Atherosclerosis in the thoracic aorta.  IMPRESSION: 1. Mild diffuse peribronchial cuffing may suggest an acute bronchitis. 2. Mild cardiomegaly. 3. Atherosclerosis.   Electronically Signed   By:  Vinnie Langton M.D.   On: 05/02/2016 11:45    ------------------------------------------------------------------------------------------------------------------  Thank  you for allowing [facility] to assist in the care of your patient. Our recommendations are noted above.  Please contact us if we can be of further service.   Marda Stalker, MD.  [facility] Office Number: [phone]  Patricia Pesa, M.D.  Vilinda Boehringer, M.D.  Merton Border, M.D  10/09/2017

## 2017-10-10 ENCOUNTER — Ambulatory Visit: Payer: Medicare Other | Admitting: Internal Medicine

## 2017-10-13 ENCOUNTER — Other Ambulatory Visit: Payer: Self-pay

## 2017-10-13 ENCOUNTER — Emergency Department: Payer: Medicare Other

## 2017-10-13 ENCOUNTER — Inpatient Hospital Stay
Admission: EM | Admit: 2017-10-13 | Discharge: 2017-10-15 | DRG: 189 | Disposition: A | Discharge status: Home / Self Care | Payer: Medicare Other | Attending: Internal Medicine | Admitting: Internal Medicine

## 2017-10-13 DIAGNOSIS — J9622 Acute and chronic respiratory failure with hypercapnia: Secondary | ICD-10-CM | POA: Diagnosis present

## 2017-10-13 DIAGNOSIS — I5033 Acute on chronic diastolic (congestive) heart failure: Secondary | ICD-10-CM | POA: Diagnosis present

## 2017-10-13 DIAGNOSIS — N186 End stage renal disease: Secondary | ICD-10-CM | POA: Diagnosis present

## 2017-10-13 DIAGNOSIS — Z992 Dependence on renal dialysis: Secondary | ICD-10-CM

## 2017-10-13 DIAGNOSIS — N2581 Secondary hyperparathyroidism of renal origin: Secondary | ICD-10-CM | POA: Diagnosis present

## 2017-10-13 DIAGNOSIS — K219 Gastro-esophageal reflux disease without esophagitis: Secondary | ICD-10-CM | POA: Diagnosis present

## 2017-10-13 DIAGNOSIS — E875 Hyperkalemia: Secondary | ICD-10-CM | POA: Diagnosis present

## 2017-10-13 DIAGNOSIS — Z87891 Personal history of nicotine dependence: Secondary | ICD-10-CM | POA: Diagnosis not present

## 2017-10-13 DIAGNOSIS — I160 Hypertensive urgency: Secondary | ICD-10-CM | POA: Diagnosis present

## 2017-10-13 DIAGNOSIS — Z9111 Patient's noncompliance with dietary regimen: Secondary | ICD-10-CM | POA: Diagnosis not present

## 2017-10-13 DIAGNOSIS — I132 Hypertensive heart and chronic kidney disease with heart failure and with stage 5 chronic kidney disease, or end stage renal disease: Secondary | ICD-10-CM | POA: Diagnosis present

## 2017-10-13 DIAGNOSIS — Z8249 Family history of ischemic heart disease and other diseases of the circulatory system: Secondary | ICD-10-CM

## 2017-10-13 DIAGNOSIS — J9602 Acute respiratory failure with hypercapnia: Secondary | ICD-10-CM | POA: Diagnosis not present

## 2017-10-13 DIAGNOSIS — Z803 Family history of malignant neoplasm of breast: Secondary | ICD-10-CM | POA: Diagnosis not present

## 2017-10-13 DIAGNOSIS — J449 Chronic obstructive pulmonary disease, unspecified: Secondary | ICD-10-CM | POA: Diagnosis present

## 2017-10-13 DIAGNOSIS — E1122 Type 2 diabetes mellitus with diabetic chronic kidney disease: Secondary | ICD-10-CM | POA: Diagnosis present

## 2017-10-13 DIAGNOSIS — E874 Mixed disorder of acid-base balance: Secondary | ICD-10-CM | POA: Diagnosis present

## 2017-10-13 DIAGNOSIS — E785 Hyperlipidemia, unspecified: Secondary | ICD-10-CM | POA: Diagnosis present

## 2017-10-13 DIAGNOSIS — J9601 Acute respiratory failure with hypoxia: Secondary | ICD-10-CM | POA: Diagnosis not present

## 2017-10-13 DIAGNOSIS — G4733 Obstructive sleep apnea (adult) (pediatric): Secondary | ICD-10-CM | POA: Diagnosis present

## 2017-10-13 DIAGNOSIS — J810 Acute pulmonary edema: Secondary | ICD-10-CM

## 2017-10-13 DIAGNOSIS — R0603 Acute respiratory distress: Secondary | ICD-10-CM | POA: Diagnosis present

## 2017-10-13 DIAGNOSIS — J9621 Acute and chronic respiratory failure with hypoxia: Principal | ICD-10-CM | POA: Diagnosis present

## 2017-10-13 DIAGNOSIS — F329 Major depressive disorder, single episode, unspecified: Secondary | ICD-10-CM | POA: Diagnosis present

## 2017-10-13 DIAGNOSIS — D631 Anemia in chronic kidney disease: Secondary | ICD-10-CM | POA: Diagnosis present

## 2017-10-13 DIAGNOSIS — J90 Pleural effusion, not elsewhere classified: Secondary | ICD-10-CM

## 2017-10-13 DIAGNOSIS — J9690 Respiratory failure, unspecified, unspecified whether with hypoxia or hypercapnia: Secondary | ICD-10-CM

## 2017-10-13 LAB — CBC WITH DIFFERENTIAL/PLATELET
BASOS PCT: 1 %
Basophils Absolute: 0.1 10*3/uL (ref 0–0.1)
EOS ABS: 0.4 10*3/uL (ref 0–0.7)
Eosinophils Relative: 4 %
HCT: 33.6 % — ABNORMAL LOW (ref 35.0–47.0)
HEMOGLOBIN: 10.7 g/dL — AB (ref 12.0–16.0)
LYMPHS ABS: 1.8 10*3/uL (ref 1.0–3.6)
Lymphocytes Relative: 18 %
MCH: 32.9 pg (ref 26.0–34.0)
MCHC: 31.7 g/dL — ABNORMAL LOW (ref 32.0–36.0)
MCV: 103.7 fL — ABNORMAL HIGH (ref 80.0–100.0)
Monocytes Absolute: 0.9 10*3/uL (ref 0.2–0.9)
Monocytes Relative: 9 %
NEUTROS ABS: 7 10*3/uL — AB (ref 1.4–6.5)
NEUTROS PCT: 68 %
Platelets: 295 10*3/uL (ref 150–440)
RBC: 3.24 MIL/uL — AB (ref 3.80–5.20)
RDW: 15 % — ABNORMAL HIGH (ref 11.5–14.5)
WBC: 10.2 10*3/uL (ref 3.6–11.0)

## 2017-10-13 LAB — COMPREHENSIVE METABOLIC PANEL
ALBUMIN: 3.9 g/dL (ref 3.5–5.0)
ALK PHOS: 136 U/L — AB (ref 38–126)
ALT: 28 U/L (ref 14–54)
AST: 38 U/L (ref 15–41)
Anion gap: 13 (ref 5–15)
BUN: 65 mg/dL — ABNORMAL HIGH (ref 6–20)
CALCIUM: 7.6 mg/dL — AB (ref 8.9–10.3)
CO2: 27 mmol/L (ref 22–32)
Chloride: 103 mmol/L (ref 101–111)
Creatinine, Ser: 7.09 mg/dL — ABNORMAL HIGH (ref 0.44–1.00)
GFR calc Af Amer: 6 mL/min — ABNORMAL LOW (ref >60)
GFR calc non Af Amer: 5 mL/min — ABNORMAL LOW (ref >60)
Glucose, Bld: 144 mg/dL — ABNORMAL HIGH (ref 65–99)
Potassium: 7.2 mmol/L (ref 3.5–5.1)
SODIUM: 143 mmol/L (ref 135–145)
TOTAL PROTEIN: 7.5 g/dL (ref 6.5–8.1)
Total Bilirubin: 0.7 mg/dL (ref 0.3–1.2)

## 2017-10-13 LAB — GLUCOSE, CAPILLARY
GLUCOSE-CAPILLARY: 53 mg/dL — AB (ref 65–99)
Glucose-Capillary: 101 mg/dL — ABNORMAL HIGH (ref 65–99)
Glucose-Capillary: 178 mg/dL — ABNORMAL HIGH (ref 65–99)

## 2017-10-13 LAB — BLOOD GAS, ARTERIAL
Acid-Base Excess: 0.2 mmol/L (ref 0.0–2.0)
Bicarbonate: 30.7 mmol/L — ABNORMAL HIGH (ref 20.0–28.0)
FIO2: 0.5
O2 SAT: 87.4 %
PATIENT TEMPERATURE: 37
PO2 ART: 69 mmHg — AB (ref 83.0–108.0)
pCO2 arterial: 88 mmHg (ref 32.0–48.0)
pH, Arterial: 7.15 — CL (ref 7.350–7.450)

## 2017-10-13 LAB — TROPONIN I
TROPONIN I: 0.04 ng/mL — AB (ref <0.03)
Troponin I: 0.04 ng/mL (ref <0.03)
Troponin I: 0.04 ng/mL (ref <0.03)
Troponin I: 0.05 ng/mL (ref <0.03)

## 2017-10-13 LAB — POTASSIUM: Potassium: 3.8 mmol/L (ref 3.5–5.1)

## 2017-10-13 MED ORDER — DEXTROSE 50 % IV SOLN
50.0000 mL | Freq: Once | INTRAVENOUS | Status: AC
  Administered 2017-10-13: 50 mL via INTRAVENOUS

## 2017-10-13 MED ORDER — HEPARIN SODIUM (PORCINE) 5000 UNIT/ML IJ SOLN
5000.0000 [IU] | Freq: Three times a day (TID) | INTRAMUSCULAR | Status: DC
  Administered 2017-10-13 (×2): 5000 [IU] via SUBCUTANEOUS
  Filled 2017-10-13 (×5): qty 1

## 2017-10-13 MED ORDER — EPOETIN ALFA 10000 UNIT/ML IJ SOLN
4000.0000 [IU] | INTRAMUSCULAR | Status: DC
  Administered 2017-10-14: 4000 [IU] via INTRAVENOUS

## 2017-10-13 MED ORDER — TORSEMIDE 100 MG PO TABS
100.0000 mg | ORAL_TABLET | Freq: Every day | ORAL | Status: DC
  Administered 2017-10-13 – 2017-10-15 (×3): 100 mg via ORAL
  Filled 2017-10-13: qty 1
  Filled 2017-10-13 (×2): qty 5

## 2017-10-13 MED ORDER — CHLORHEXIDINE GLUCONATE CLOTH 2 % EX PADS
6.0000 | MEDICATED_PAD | Freq: Every day | CUTANEOUS | Status: DC
  Administered 2017-10-14 – 2017-10-15 (×2): 6 via TOPICAL

## 2017-10-13 MED ORDER — MUPIROCIN 2 % EX OINT
1.0000 "application " | TOPICAL_OINTMENT | Freq: Two times a day (BID) | CUTANEOUS | Status: DC
  Administered 2017-10-13 – 2017-10-15 (×4): 1 via NASAL
  Filled 2017-10-13: qty 22

## 2017-10-13 MED ORDER — DEXTROSE 50 % IV SOLN
1.0000 | Freq: Once | INTRAVENOUS | Status: AC
  Administered 2017-10-13: 50 mL via INTRAVENOUS
  Filled 2017-10-13: qty 50

## 2017-10-13 MED ORDER — SODIUM CHLORIDE 0.9 % IV SOLN
1.0000 g | Freq: Once | INTRAVENOUS | Status: AC
  Administered 2017-10-13: 1 g via INTRAVENOUS
  Filled 2017-10-13: qty 10

## 2017-10-13 MED ORDER — FUROSEMIDE 10 MG/ML IJ SOLN
80.0000 mg | Freq: Once | INTRAMUSCULAR | Status: AC
  Administered 2017-10-13: 80 mg via INTRAVENOUS
  Filled 2017-10-13: qty 8

## 2017-10-13 MED ORDER — ONDANSETRON HCL 4 MG PO TABS: 4.0000 mg | ORAL_TABLET | Freq: Four times a day (QID) | ORAL | Status: DC | PRN

## 2017-10-13 MED ORDER — NITROGLYCERIN IN D5W 200-5 MCG/ML-% IV SOLN
0.0000 ug/min | Freq: Once | INTRAVENOUS | Status: AC
  Administered 2017-10-13: 5 ug/min via INTRAVENOUS
  Filled 2017-10-13: qty 250

## 2017-10-13 MED ORDER — DEXTROSE 50 % IV SOLN
INTRAVENOUS | Status: AC
  Administered 2017-10-13: 50 mL via INTRAVENOUS
  Filled 2017-10-13: qty 50

## 2017-10-13 MED ORDER — SODIUM POLYSTYRENE SULFONATE 15 GM/60ML PO SUSP
30.0000 g | Freq: Once | ORAL | Status: DC
  Filled 2017-10-13: qty 120

## 2017-10-13 MED ORDER — DOCUSATE SODIUM 100 MG PO CAPS
100.0000 mg | ORAL_CAPSULE | Freq: Two times a day (BID) | ORAL | Status: DC
  Administered 2017-10-14 – 2017-10-15 (×3): 100 mg via ORAL
  Filled 2017-10-13 (×5): qty 1

## 2017-10-13 MED ORDER — NITROGLYCERIN IN D5W 200-5 MCG/ML-% IV SOLN
0.0000 ug/min | Freq: Once | INTRAVENOUS | Status: AC
  Administered 2017-10-13: 110 ug/min via INTRAVENOUS
  Filled 2017-10-13: qty 250

## 2017-10-13 MED ORDER — ONDANSETRON HCL 4 MG/2ML IJ SOLN
4.0000 mg | Freq: Four times a day (QID) | INTRAMUSCULAR | Status: DC | PRN
  Administered 2017-10-14: 4 mg via INTRAVENOUS
  Filled 2017-10-13: qty 2

## 2017-10-13 MED ORDER — SODIUM POLYSTYRENE SULFONATE 15 GM/60ML PO SUSP
30.0000 g | Freq: Once | ORAL | Status: AC
  Administered 2017-10-13: 30 g via ORAL

## 2017-10-13 MED ORDER — ACETAMINOPHEN 325 MG PO TABS
650.0000 mg | ORAL_TABLET | Freq: Four times a day (QID) | ORAL | Status: DC | PRN
  Administered 2017-10-13 (×2): 650 mg via ORAL
  Filled 2017-10-13 (×2): qty 2

## 2017-10-13 MED ORDER — ORAL CARE MOUTH RINSE
15.0000 mL | Freq: Two times a day (BID) | OROMUCOSAL | Status: DC
  Administered 2017-10-13: 15 mL via OROMUCOSAL

## 2017-10-13 MED ORDER — INSULIN ASPART 100 UNIT/ML ~~LOC~~ SOLN
SUBCUTANEOUS | Status: AC
  Administered 2017-10-13: 8 [IU] via INTRAVENOUS
  Filled 2017-10-13: qty 1

## 2017-10-13 MED ORDER — SODIUM BICARBONATE 8.4 % IV SOLN
50.0000 meq | Freq: Once | INTRAVENOUS | Status: AC
  Administered 2017-10-13: 50 meq via INTRAVENOUS
  Filled 2017-10-13: qty 50

## 2017-10-13 MED ORDER — HYDRALAZINE HCL 20 MG/ML IJ SOLN
10.0000 mg | Freq: Once | INTRAMUSCULAR | Status: AC
  Administered 2017-10-13: 10 mg via INTRAVENOUS
  Filled 2017-10-13: qty 1

## 2017-10-13 MED ORDER — SODIUM POLYSTYRENE SULFONATE 15 GM/60ML PO SUSP: 60.0000 g | Freq: Once | ORAL | Status: DC

## 2017-10-13 MED ORDER — CALCIUM GLUCONATE 10 % IV SOLN
INTRAVENOUS | Status: AC
  Filled 2017-10-13: qty 10

## 2017-10-13 MED ORDER — INSULIN ASPART 100 UNIT/ML ~~LOC~~ SOLN
8.0000 [IU] | Freq: Once | SUBCUTANEOUS | Status: AC
  Administered 2017-10-13: 8 [IU] via INTRAVENOUS
  Filled 2017-10-13: qty 1

## 2017-10-13 MED ORDER — ACETAMINOPHEN 650 MG RE SUPP: 650.0000 mg | Freq: Four times a day (QID) | RECTAL | Status: DC | PRN

## 2017-10-13 NOTE — Progress Notes (Signed)
Post HD assessment  

## 2017-10-13 NOTE — ED Notes (Signed)
Pt c/o tightness across entire chest when about to start nitro; pt hypertensive;

## 2017-10-13 NOTE — Progress Notes (Signed)
[facility]                                                                                                                                                                                  Patient Demographics   Michele Nash, is a 61 y.o. female, DOB - [birth year], NAT:[id number]  Admit date - 10/13/2017   Admitting Physician Harrie Foreman, MD  Outpatient Primary MD for the patient is [name], MD   LOS - 0  Subjective: Patient breathing is improved continues to be on 5 L of oxygen Patient reports that she had her regular dialysis on Friday   Review of Systems:   CONSTITUTIONAL: No documented fever. No fatigue, weakness. No weight gain, no weight loss.  EYES: No blurry or double vision.  ENT: No tinnitus. No postnasal drip. No redness of the oropharynx.  RESPIRATORY: No cough, no wheeze, no hemoptysis.  Positive dyspnea.  CARDIOVASCULAR: No chest pain. No orthopnea. No palpitations. No syncope.  GASTROINTESTINAL: No nausea, no vomiting or diarrhea. No abdominal pain. No melena or hematochezia.  GENITOURINARY: No dysuria or hematuria.  ENDOCRINE: No polyuria or nocturia. No heat or cold intolerance.  HEMATOLOGY: No anemia. No bruising. No bleeding.  INTEGUMENTARY: No rashes. No lesions.  MUSCULOSKELETAL: No arthritis. No swelling. No gout.  NEUROLOGIC: No numbness, tingling, or ataxia. No seizure-type activity.  PSYCHIATRIC: No anxiety. No insomnia. No ADD.    Vitals:   Vitals:   10/13/17 1450 10/13/17 1500 10/13/17 1515 10/13/17 1530  BP: (!) 189/95  (!) 181/90 (!) 189/92  Pulse: 79 77 81 76  Resp: (!) 29 (!) 21 (!) 34 (!) 23  Temp:      TempSrc:      SpO2: 100% 98% 95% 96%  Weight:      Height:        Wt Readings from Last 3 Encounters:  10/13/17 140 lb 14 oz (63.9 kg)  10/07/17 135 lb 9.3 oz (61.5 kg)  09/24/17 129 lb 2 oz (58.6 kg)     Intake/Output Summary (Last 24 hours) at 10/13/2017 1542 Last data filed at  10/13/2017 1400 Gross per 24 hour  Intake 181 ml  Output 700 ml  Net -519 ml    Physical Exam:   GENERAL: Pleasant-appearing in no apparent distress.  HEAD, EYES, EARS, NOSE AND THROAT: Atraumatic, normocephalic. Extraocular muscles are intact. Pupils equal and reactive to light. Sclerae anicteric. No conjunctival injection. No oro-pharyngeal erythema.  NECK: Supple. There is no jugular venous distention. No bruits, no lymphadenopathy, no thyromegaly.  HEART: Regular rate and rhythm,. No murmurs, no rubs, no clicks.  LUNGS: Crackles at the bases. No  rales or rhonchi. No wheezes.  ABDOMEN: Soft, flat, nontender, nondistended. Has good bowel sounds. No hepatosplenomegaly appreciated.  EXTREMITIES: No evidence of any cyanosis, clubbing, or peripheral edema.  +2 pedal and radial pulses bilaterally.  NEUROLOGIC: The patient is alert, awake, and oriented x3 with no focal motor or sensory deficits appreciated bilaterally.  SKIN: Moist and warm with no rashes appreciated.  Psych: Not anxious, depressed LN: No inguinal LN enlargement    Antibiotics   Anti-infectives (From admission, onward)   None      Medications   Scheduled Meds: . calcium gluconate      . Chlorhexidine Gluconate Cloth  6 each Topical Q0600  . docusate sodium  100 mg Oral BID  . [START ON 10/14/2017] epoetin (EPOGEN/PROCRIT) injection  4,000 Units Intravenous Q M,W,F-HD  . heparin  5,000 Units Subcutaneous Q8H  . mupirocin ointment  1 application Nasal BID  . torsemide  100 mg Oral Daily   Continuous Infusions: PRN Meds:.acetaminophen **OR** acetaminophen, ondansetron **OR** ondansetron (ZOFRAN) IV   Data Review:   Micro Results Recent Results (from the past 240 hour(s))  MRSA PCR Screening     Status: Abnormal   Collection Time: 10/06/17 12:00 AM  Result Value Ref Range Status   MRSA by PCR POSITIVE (A) NEGATIVE Final    Comment:        The GeneXpert MRSA Assay (FDA approved for NASAL specimens only),  is one component of a comprehensive MRSA colonization surveillance program. It is not intended to diagnose MRSA infection nor to guide or monitor treatment for MRSA infections. RESULT CALLED TO, READ BACK BY AND VERIFIED WITH: ERICA TAYLOR AT 0204 10/06/17.[place]     Radiology Reports Dg Chest Port 1 View  Result Date: 10/13/2017 CLINICAL DATA:  Initial evaluation for acute respiratory distress. EXAM: PORTABLE CHEST 1 VIEW COMPARISON:  Prior radiograph from 12/05/2016. FINDINGS: Right-sided dual-lumen hemodialysis catheter in place, stable. Moderate cardiomegaly. Mediastinal silhouette normal. Aortic atherosclerosis. Lungs normally inflated. Diffuse vascular congestion with interstitial prominence, compatible with pulmonary edema. Small bilateral pleural effusions. No definite focal infiltrates. No pneumothorax. No acute osseous abnormality. IMPRESSION: 1. Cardiomegaly with moderate diffuse pulmonary edema and small bilateral pleural effusions, consistent with acute CHF exacerbation. 2. Aortic atherosclerosis. Electronically Signed   By: Jeannine Boga M.D.   On: 10/13/2017 04:03   Dg Chest Portable 1 View  Result Date: 10/05/2017 CLINICAL DATA:  Dyspnea EXAM: PORTABLE CHEST 1 VIEW COMPARISON:  08/11/2017 chest radiograph. FINDINGS: Right internal jugular central venous catheter terminates in the middle third of the superior vena cava. Stable cardiomediastinal silhouette with mild cardiomegaly and aortic atherosclerosis. No pneumothorax. Trace bilateral pleural effusions. Hazy and linear parahilar lung opacities in both lungs, compatible with mild-to-moderate pulmonary edema. IMPRESSION: Mild-to-moderate congestive heart failure with trace bilateral pleural effusions. Electronically Signed   By: Ilona Sorrel M.D.   On: 10/05/2017 19:53     CBC Recent Labs  Lab 10/13/17 0324  WBC 10.2  HGB 10.7*  HCT 33.6*  PLT 295  MCV 103.7*  MCH 32.9  MCHC 31.7*  RDW 15.0*  LYMPHSABS 1.8   MONOABS 0.9  EOSABS 0.4  BASOSABS 0.1    Chemistries  Recent Labs  Lab 10/13/17 0324  NA 143  K 7.2*  CL 103  CO2 27  GLUCOSE 144*  BUN 65*  CREATININE 7.09*  CALCIUM 7.6*  AST 38  ALT 28  ALKPHOS 136*  BILITOT 0.7   ------------------------------------------------------------------------------------------------------------------ estimated creatinine clearance is 7.9 mL/min (A) (by C-G formula  based on SCr of 7.09 mg/dL (H)). ------------------------------------------------------------------------------------------------------------------ No results for input(s): HGBA1C in the last 72 hours. ------------------------------------------------------------------------------------------------------------------ No results for input(s): CHOL, HDL, LDLCALC, TRIG, CHOLHDL, LDLDIRECT in the last 72 hours. ------------------------------------------------------------------------------------------------------------------ No results for input(s): TSH, T4TOTAL, T3FREE, THYROIDAB in the last 72 hours.  Invalid input(s): FREET3 ------------------------------------------------------------------------------------------------------------------ No results for input(s): VITAMINB12, FOLATE, FERRITIN, TIBC, IRON, RETICCTPCT in the last 72 hours.  Coagulation profile No results for input(s): INR, PROTIME in the last 168 hours.  No results for input(s): DDIMER in the last 72 hours.  Cardiac Enzymes Recent Labs  Lab 10/13/17 0324 10/13/17 0831 10/13/17 1408  TROPONINI 0.04* 0.04* 0.04*   ------------------------------------------------------------------------------------------------------------------ Invalid input(s): POCBNP    Assessment & Plan   This is a 61 year old female admitted for respiratory failure. 1.  Respiratory failure: Acute on chronic; with hypoxia and hypercapnia.    This is related to acute on chronic diastolic CHF, patient will be receiving hemodialysis for this 2.   Hyperkalemia: The patient has been given calcium gluconate, Lasix, insulin and Kayexalate.  Has received Kayexalate hemodialysis should help 3.  ESRD: Limit fluid intake.  Continue Sensipar and PhosLo.  Nephrology is aware of the patient's admission and will be dialyzing her some  4.  CHF: Acute on chronic diastolic failure.  Managed blood pressure.    Resume carvedilol 5.  Hypertension: Uncontrolled; continue amlodipine, hydralazine, hydrochlorothiazide and losartan.  As needed PRN hydralazine 6.  Depression: Continue sertraline 7.  DVT prophylaxis: Heparin 8.  GI prophylaxis: Pantoprazole per home regimen       Code Status Orders  (From admission, onward)        Start     Ordered   10/13/17 0801  Full code  Continuous     10/13/17 0800    Code Status History    Date Active Date Inactive Code Status Order ID Comments User Context   10/05/2017 23:59 10/07/2017 17:45 Full Code [id number]  Gorden Harms, MD Inpatient   08/11/2017 17:56 08/13/2017 16:15 Full Code [id number]  Vaughan Basta, MD Inpatient   06/15/2017 19:42 06/20/2017 16:52 Full Code [id number]  Idelle Crouch, MD Inpatient   05/21/2017 17:58 05/22/2017 17:24 Full Code [id number]  Nicholes Mango, MD Inpatient   12/04/2016 09:55 12/11/2016 17:41 Full Code [id number]  Fritzi Mandes, MD Inpatient   10/29/2016 12:14 10/30/2016 19:43 Full Code [id number]  Laverle Hobby, MD ED   10/06/2016 20:33 10/11/2016 15:54 Full Code [id number]  Mikael Spray, NP ED   09/14/2016 09:19 09/16/2016 16:07 Full Code [id number]  Laverle Hobby, MD ED   07/23/2016 11:05 07/24/2016 22:49 Full Code [id number]  Wilhelmina Mcardle, MD ED   07/09/2016 18:43 07/13/2016 17:23 Full Code [id number]  Lytle Butte, MD ED   05/28/2016 14:54 06/02/2016 14:51 Full Code [id number]  Theodoro Grist, MD Inpatient   04/25/2016 18:04 04/26/2016 21:17 Full Code [id number]  Nicholes Mango, MD Inpatient   10/28/2015 18:50 10/30/2015 18:09 Full Code [id number]  Theodoro Grist, MD Inpatient   03/30/2015 00:51 03/31/2015 16:44 Full Code [id number]  Juluis Mire, MD Inpatient           Consults nephrology, intensivist  DVT Prophylaxis heparin  Lab Results  Component Value Date   PLT 295 10/13/2017     Time Spent in minutes   45 minutes of additional critical care time spent greater than 50% of time spent in care coordination and counseling patient regarding the condition and plan of care.   Dustin Flock M.D on 10/13/2017 at  3:42 PM  Between 7am to 6pm - Pager - [phone]  After 6pm go to [email or web address] - password [email or web address] [facility]   Office  [phone]

## 2017-10-13 NOTE — ED Notes (Signed)
MD verbal order to give kayexalate oral if pt can tolerate
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
## 2017-10-13 NOTE — ED Notes (Signed)
Repositioned for comfort after done giving medications; husband remains at bedside and understands pt to be admitted to ICU

## 2017-10-13 NOTE — Progress Notes (Signed)
HD tx start 

## 2017-10-13 NOTE — ED Notes (Addendum)
Entered wrong time.

## 2017-10-13 NOTE — ED Notes (Addendum)
Husband at bedside; he say pt was just discharged from the hospital last weekend; says she was doing "so-so" at home; around 1am husband says pt began to say she wasn't feeling well; pt was in respiratory distress upon arrival; side rails up x 2 with call bell in reach; pt and husband encouraged to use for any changes or needs

## 2017-10-13 NOTE — Progress Notes (Signed)
1800 In much better mood this pm. Dialysis removed 3 liters of fluid and K+ is down to 3.8. Has eaten nonstop since 1 pm. Voiding small amounts of clear yellow urine.

## 2017-10-13 NOTE — Progress Notes (Signed)
eLink Physician-Brief Progress Note Patient Name: EMANUELLA NICKLE DOB: [birth year] MRN: [id number]   Date of Service  10/13/2017  HPI/Events of Note  Hypertension - BP = 175/89  eICU Interventions  Will reorder Nitroglycerin IV infusion. Titrate to SBP < 140.     Intervention Category Major Interventions: Hypertension - evaluation and management  Sommer,Steven Eugene 10/13/2017, 8:15 PM

## 2017-10-13 NOTE — Progress Notes (Signed)
Pre HD assessment  

## 2017-10-13 NOTE — ED Notes (Signed)
Laurie RN, aware of bed assigned  

## 2017-10-13 NOTE — H&P (Signed)
Michele Nash is an 61 y.o. female.   Chief Complaint: Respiratory distress HPI: The patient with past medical history of end-stage renal disease on dialysis, COPD, CHF and hypertension presents to the emergency department due to shortness of breath.  She denies chest pain.  Her dyspnea began acutely this evening.  She was started on BiPAP due to increased work of breathing.  She was found to metabolic and respiratory acidosis in addition to hypoxia.  She was given Lasix.  Potassium was also elevated.  The emergency department staff shifted intravascular potassium and stabilized the patient's breathing prior to calling the hospitalist service for admission.  Past Medical History:  Diagnosis Date  . Altered mental status   . Anemia   . Apnea, sleep    for sleep study 10/17/15-no cpap yet  . Arthritis   . Bronchitis   . CHF (congestive heart failure) ([place])   . Chronic kidney disease   . COPD (chronic obstructive pulmonary disease) ([place])   . Depression   . Dialysis patient ([place]) 2014  . GERD (gastroesophageal reflux disease)   . GIB (gastrointestinal bleeding) 07/09/2016  . Headache   . Hyperlipidemia   . Hypertension   . Obesity   . Pulmonary edema 09/14/2016  . Renal dialysis device, implant, or graft complication    RIGHT CHEST CATH  . Respiratory failure ([place]) 07/23/2016  . Traumatic hematoma of forehead   . Type II diabetes mellitus ([place]) 09/21/2009   diet controlled    Past Surgical History:  Procedure Laterality Date  . A/V Shunt Intervention N/A 08/23/2015   Performed by Katha Cabal, MD at [place] [facility]  . A/V Shunt Intervention Left 05/10/2015   Performed by Katha Cabal, MD at [place] [facility]  . A/V Shuntogram/Fistulagram Left 08/23/2015   Performed by Katha Cabal, MD at [place] [facility]  . A/V Shuntogram/Fistulagram N/A 05/10/2015   Performed by Katha Cabal, MD at [place] [facility]  . ABDOMINAL HYSTERECTOMY    . COLONOSCOPY   2011  . COLONOSCOPY WITH PROPOFOL N/A 09/20/2015   Performed by Lucilla Lame, MD at [place]  . DIALYSIS FISTULA CREATION    . Dialysis/Perma Catheter Insertion  08/23/2015   Performed by Katha Cabal, MD at [place] [facility]  . Dialysis/Perma Catheter Removal N/A 01/03/2016   Performed by Katha Cabal, MD at [place] [facility]  . ESOPHAGOGASTRODUODENOSCOPY (EGD) N/A 07/13/2016   Performed by Lollie Sails, MD at [place]  . ESOPHAGOGASTRODUODENOSCOPY (EGD) WITH PROPOFOL N/A 06/02/2016   Performed by Manya Silvas, MD at [place]  . LIGATION OF ARTERIOVENOUS  FISTULA ( PERMCATH INSERTION ) Left 05/24/2017   Performed by Katha Cabal, MD at [facility]S  . Resection of shoulder cyst ( left ) Left 10/28/2015   Performed by Katha Cabal, MD at [facility] ORS  . REVISON OF ARTERIOVENOUS FISTULA WITH ARTEGRAFT Left 10/28/2015   Performed by Katha Cabal, MD at [facility] ORS    Family History  Problem Relation Age of Onset  . Heart disease Mother   . Cancer Father   . Cancer Sister   . Breast cancer Sister    Social History:  reports that she quit smoking about 2 months ago. Her smoking use included cigarettes. She quit after 40.00 years of use. she has never used smokeless tobacco. She reports that she does not drink alcohol or use drugs.  Allergies:  Allergies  Allergen Reactions  . Sulfa Antibiotics Itching, Swelling, Rash and Other (See Comments)    Reaction:  Facial/body swelling      (Not in a hospital admission)  Results for orders placed or performed during the hospital encounter of 10/13/17 (from the past 48 hour(s))  CBC with Differential     Status: Abnormal   Collection Time: 10/13/17  3:24 AM  Result Value Ref Range   WBC 10.2 3.6 - 11.0 K/uL   RBC 3.24 (L) 3.80 - 5.20 MIL/uL   Hemoglobin 10.7 (L) 12.0 - 16.0 g/dL   HCT 33.6 (L) 35.0 - 47.0 %   MCV 103.7 (H) 80.0 - 100.0 fL   MCH 32.9 26.0 - 34.0 pg   MCHC 31.7 (L) 32.0 -  36.0 g/dL   RDW 15.0 (H) 11.5 - 14.5 %   Platelets 295 150 - 440 K/uL   Neutrophils Relative % 68 %   Neutro Abs 7.0 (H) 1.4 - 6.5 K/uL   Lymphocytes Relative 18 %   Lymphs Abs 1.8 1.0 - 3.6 K/uL   Monocytes Relative 9 %   Monocytes Absolute 0.9 0.2 - 0.9 K/uL   Eosinophils Relative 4 %   Eosinophils Absolute 0.4 0 - 0.7 K/uL   Basophils Relative 1 %   Basophils Absolute 0.1 0 - 0.1 K/uL  Comprehensive metabolic panel     Status: Abnormal   Collection Time: 10/13/17  3:24 AM  Result Value Ref Range   Sodium 143 135 - 145 mmol/L   Potassium 7.2 (HH) 3.5 - 5.1 mmol/L    Comment: CRITICAL RESULT CALLED TO, READ BACK BY AND VERIFIED WITH LAURIE ALLEN ON 10/13/17 AT 0413 [facility]    Chloride 103 101 - 111 mmol/L   CO2 27 22 - 32 mmol/L   Glucose, Bld 144 (H) 65 - 99 mg/dL   BUN 65 (H) 6 - 20 mg/dL   Creatinine, Ser 7.09 (H) 0.44 - 1.00 mg/dL   Calcium 7.6 (L) 8.9 - 10.3 mg/dL   Total Protein 7.5 6.5 - 8.1 g/dL   Albumin 3.9 3.5 - 5.0 g/dL   AST 38 15 - 41 U/L   ALT 28 14 - 54 U/L   Alkaline Phosphatase 136 (H) 38 - 126 U/L   Total Bilirubin 0.7 0.3 - 1.2 mg/dL   GFR calc non Af Amer 5 (L) >60 mL/min   GFR calc Af Amer 6 (L) >60 mL/min    Comment: (NOTE) The eGFR has been calculated using the CKD EPI equation. This calculation has not been validated in all clinical situations. eGFR's persistently <60 mL/min signify possible Chronic Kidney Disease.    Anion gap 13 5 - 15  Troponin I     Status: Abnormal   Collection Time: 10/13/17  3:24 AM  Result Value Ref Range   Troponin I 0.04 (HH) <0.03 ng/mL    Comment: CRITICAL RESULT CALLED TO, READ BACK BY AND VERIFIED WITH LAURIE ALLEN ON 10/13/17 AT 0413 [facility]   Blood gas, arterial     Status: Abnormal (Preliminary result)   Collection Time: 10/13/17  4:05 AM  Result Value Ref Range   FIO2 0.50    Delivery systems PENDING    Inspiratory PAP PENDING    Expiratory PAP PENDING    pH, Arterial 7.15 (LL) 7.350 - 7.450    Comment:  CRITICAL RESULT, NOTIFIED PHYSICIAN DR SUNG  10/13/17  0430  BC    pCO2 arterial 88 (HH) 32.0 - 48.0 mmHg  Comment: CRITICAL RESULT, NOTIFIED PHYSICIAN DR SUNG  10/13/17  0430  BC    pO2, Arterial 69 (L) 83.0 - 108.0 mmHg   Bicarbonate 30.7 (H) 20.0 - 28.0 mmol/L   Acid-Base Excess 0.2 0.0 - 2.0 mmol/L   O2 Saturation 87.4 %   Patient temperature 37.0    Oxygen index PENDING    Collection site RIGHT RADIAL    Sample type ARTERIAL DRAW    Allens test (pass/fail) PASS PASS   Dg Chest Port 1 View  Result Date: 10/13/2017 CLINICAL DATA:  Initial evaluation for acute respiratory distress. EXAM: PORTABLE CHEST 1 VIEW COMPARISON:  Prior radiograph from 12/05/2016. FINDINGS: Right-sided dual-lumen hemodialysis catheter in place, stable. Moderate cardiomegaly. Mediastinal silhouette normal. Aortic atherosclerosis. Lungs normally inflated. Diffuse vascular congestion with interstitial prominence, compatible with pulmonary edema. Small bilateral pleural effusions. No definite focal infiltrates. No pneumothorax. No acute osseous abnormality. IMPRESSION: 1. Cardiomegaly with moderate diffuse pulmonary edema and small bilateral pleural effusions, consistent with acute CHF exacerbation. 2. Aortic atherosclerosis. Electronically Signed   By: Jeannine Boga M.D.   On: 10/13/2017 04:03    Review of Systems  Constitutional: Negative for chills and fever.  HENT: Negative for sore throat and tinnitus.   Eyes: Negative for blurred vision and redness.  Respiratory: Positive for shortness of breath. Negative for cough.   Cardiovascular: Negative for chest pain, palpitations, orthopnea and PND.  Gastrointestinal: Negative for abdominal pain, diarrhea, nausea and vomiting.  Genitourinary: Negative for dysuria, frequency and urgency.  Musculoskeletal: Negative for joint pain and myalgias.  Skin: Negative for rash.       No lesions  Neurological: Negative for speech change, focal weakness and weakness.   Endo/Heme/Allergies: Does not bruise/bleed easily.       No temperature intolerance  Psychiatric/Behavioral: Negative for depression and suicidal ideas.    Blood pressure (!) 158/80, pulse 66, resp. rate (!) 26, height 5' 7"  (1.702 m), weight 61.2 kg (135 lb), SpO2 98 %. Physical Exam  Vitals reviewed. Constitutional: She is oriented to person, place, and time. She appears well-developed and well-nourished. She appears distressed.  HENT:  Head: Normocephalic and atraumatic.  Mouth/Throat: Oropharynx is clear and moist.  Eyes: Conjunctivae and EOM are normal. Pupils are equal, round, and reactive to light. No scleral icterus.  Neck: Normal range of motion. Neck supple. No JVD present. No tracheal deviation present. No thyromegaly present.  Cardiovascular: Normal rate, regular rhythm and normal heart sounds. Exam reveals no gallop and no friction rub.  No murmur heard. Respiratory: Breath sounds normal. She is in respiratory distress.  GI: Soft. Bowel sounds are normal. She exhibits no distension. There is no tenderness.  Genitourinary:  Genitourinary Comments: deferred  Musculoskeletal: Normal range of motion. She exhibits no edema.  Lymphadenopathy:    She has no cervical adenopathy.  Neurological: She is alert and oriented to person, place, and time. No cranial nerve deficit. She exhibits normal muscle tone.  Skin: Skin is warm and dry.  Psychiatric: She has a normal mood and affect. Her behavior is normal. Judgment and thought content normal.     Assessment/Plan This is a 61 year old female admitted for respiratory failure. 1.  Respiratory failure: Acute on chronic; with hypoxia and hypercapnia.  The patient is on BiPAP and feeling better.  She still has some increased work of breathing as well as wheezing. She has COPD in addition to ESRD and heart failure which contributes to her interstitial edema.  She produces some urine and was given  Lasix.  Continue noninvasive ventilation.   Consult nephrology for dialysis. 2.  Hyperkalemia: The patient has been given calcium gluconate, Lasix, insulin and Kayexalate.  Will need urgent dialysis. 3.  ESRD: Limit fluid intake.  Continue Sensipar and PhosLo.  Consult nephrology for continuation of dialysis. 4.  CHF: Acute on chronic diastolic failure.  Managed blood pressure.  Resume carvedilol once patient is no longer an acute exacerbation.  5.  Hypertension: Uncontrolled; continue amlodipine, hydralazine, hydrochlorothiazide and losartan.  As needed 6.  Depression: Continue sertraline 7.  DVT prophylaxis: Heparin 8.  GI prophylaxis: Pantoprazole per home regimen The patient is a full code.  Time spent on admission orders and critical care approximately 45 minutes  Harrie Foreman, MD 10/13/2017, 5:16 AM

## 2017-10-13 NOTE — Consult Note (Signed)
[facility] Consultation    ASSESSMENT/PLAN   Respiratory failure. Hypercapnic and hypoxemic. Presently on noninvasive ventilation. Most likely secondary to dietary indiscretions, pulmonary edema, volume overload, pending hemodialysis  Renal failure. Hemodialysis per nephrology service  Hyperkalemia. Has been shifted in the emergency department, pending hemodialysis  Positive troponin at 0.04. No chest pain, no acute ischemic changes on EKG, difficult to interpret in the setting of renal failure. Patient is in pulmonary edema may reflect cardiac strain  Mild anemia. No evidence of active bleeding  Critical care time 40 minutes  Name: Michele Nash MRN: [id number] DOB: [birth year]    ADMISSION DATE:  10/13/2017 CONSULTATION DATE: 11/'18/2018  REFERRING MD :  Hospitalist service  CHIEF COMPLAINT:  Shortness of breath   HISTORY OF PRESENT ILLNESS:  Michele Nash is a 61 year old Afro-American female well-known to our service with a past medical history remarkable for COPD, congestive heart failure, obstructive sleep apnea, hypertension, hyperlipidemia, type 2 diabetes, degenerative joint disease who frequently presents with shortness of breath with dietary indiscretions requiring emergent hemodialysis. She presented to the emergency department complaining of significant increasing shortness of breath, required noninvasive ventilation secondary to increased work of breathing. Was found to have both hypercapnic and hypoxemic respiratory failure with a pH is 7.15 and a PCO2 of 88. Other pertinent labs were troponin was elevated at 0.04, potassium elevated at 7.2, has been shifted, BUN 65, creatinine 7.09, hemoglobin 10.7. Chest x-ray revealed cardiomegaly with pulmonary edema, dialysis catheter in place .  PAST MEDICAL HISTORY :  Past Medical History:  Diagnosis Date  . Altered mental status   . Anemia   . Apnea, sleep    for sleep study 10/17/15-no cpap yet  .  Arthritis   . Bronchitis   . CHF (congestive heart failure) ([place])   . Chronic kidney disease   . COPD (chronic obstructive pulmonary disease) ([place])   . Depression   . Dialysis patient ([place]) 2014  . GERD (gastroesophageal reflux disease)   . GIB (gastrointestinal bleeding) 07/09/2016  . Headache   . Hyperlipidemia   . Hypertension   . Obesity   . Pulmonary edema 09/14/2016  . Renal dialysis device, implant, or graft complication    RIGHT CHEST CATH  . Respiratory failure ([place]) 07/23/2016  . Traumatic hematoma of forehead   . Type II diabetes mellitus ([place]) 09/21/2009   diet controlled   Past Surgical History:  Procedure Laterality Date  . A/V Shunt Intervention N/A 08/23/2015   Performed by Katha Cabal, MD at [place] [facility]  . A/V Shunt Intervention Left 05/10/2015   Performed by Katha Cabal, MD at [place] [facility]  . A/V Shuntogram/Fistulagram Left 08/23/2015   Performed by Katha Cabal, MD at [place] [facility]  . A/V Shuntogram/Fistulagram N/A 05/10/2015   Performed by Katha Cabal, MD at [place] [facility]  . ABDOMINAL HYSTERECTOMY    . COLONOSCOPY  2011  . COLONOSCOPY WITH PROPOFOL N/A 09/20/2015   Performed by Lucilla Lame, MD at [place]  . DIALYSIS FISTULA CREATION    . Dialysis/Perma Catheter Insertion  08/23/2015   Performed by Katha Cabal, MD at [place] [facility]  . Dialysis/Perma Catheter Removal N/A 01/03/2016   Performed by Katha Cabal, MD at [place] [facility]  . ESOPHAGOGASTRODUODENOSCOPY (EGD) N/A 07/13/2016   Performed by Lollie Sails, MD at [place]  . ESOPHAGOGASTRODUODENOSCOPY (EGD) WITH PROPOFOL N/A 06/02/2016   Performed  by Manya Silvas, MD at [place]  . LIGATION OF ARTERIOVENOUS  FISTULA ( PERMCATH INSERTION ) Left 05/24/2017   Performed by Katha Cabal, MD at [facility] ORS  . Resection of shoulder cyst ( left ) Left 10/28/2015   Performed by Katha Cabal, MD at [facility]  ORS  . REVISON OF ARTERIOVENOUS FISTULA WITH ARTEGRAFT Left 10/28/2015   Performed by Katha Cabal, MD at [facility] ORS   Prior to Admission medications   Medication Sig Start Date End Date Taking? Authorizing Provider  albuterol (PROVENTIL HFA;VENTOLIN HFA) 108 (90 Base) MCG/ACT inhaler Inhale 2 puffs every 6 (six) hours as needed into the lungs for wheezing or shortness of breath. 10/07/17  Yes Vaughan Basta, MD  ALPRAZolam Duanne Moron) 0.5 MG tablet Take 0.5 mg by mouth 2 (two) times daily.   Yes [provider]  Amino Acids-Protein Hydrolys (FEEDING SUPPLEMENT, PRO-STAT SUGAR FREE 64,) LIQD Take 30 mLs by mouth daily. 12/12/16  Yes Gouru, Illene Silver, MD  amLODipine (NORVASC) 10 MG tablet Take 10 mg by mouth daily.    Yes [provider]  calcium acetate (PHOSLO) 667 MG capsule Take 3 capsules (2,001 mg total) by mouth 3 (three) times daily with meals. 06/20/17  Yes Wieting, Richard, MD  carvedilol (COREG) 12.5 MG tablet Take 12.5 mg by mouth 2 (two) times daily with a meal.   Yes [provider]  cinacalcet (SENSIPAR) 30 MG tablet Take 60 mg by mouth daily.    Yes [provider]  ezetimibe (ZETIA) 10 MG tablet Take 10 mg by mouth daily.    Yes [provider]  fluticasone (FLONASE) 50 MCG/ACT nasal spray Place 2 sprays into both nostrils daily as needed for rhinitis.   Yes [provider]  furosemide (LASIX) 40 MG tablet Take 40 mg by mouth daily.    Yes [provider]  hydrALAZINE (APRESOLINE) 50 MG tablet Take 1 tablet (50 mg total) by mouth 3 (three) times daily. 08/13/17  Yes Mody, Ulice Bold, MD  hydrochlorothiazide (HYDRODIURIL) 25 MG tablet Take 1 tablet (25 mg total) by mouth daily. 09/11/17  Yes Menshew, Dannielle Karvonen, PA-C  ipratropium-albuterol (DUONEB) 0.5-2.5 (3) MG/3ML SOLN Take 3 mLs by nebulization every 6 (six) hours as needed. 12/11/16  Yes Gouru, Illene Silver, MD  lidocaine-prilocaine (EMLA) cream Apply 1 application topically  as needed (prior to accessing port).   Yes [provider]  losartan (COZAAR) 100 MG tablet Take 100 mg by mouth daily.    Yes [provider]  multivitamin (RENA-VIT) TABS tablet Take 1 tablet by mouth at bedtime.    Yes [provider]  ondansetron (ZOFRAN) 4 MG tablet Take 1 tablet (4 mg total) by mouth every 8 (eight) hours as needed for nausea or vomiting. 03/24/17  Yes Lisa Roca, MD  pantoprazole (PROTONIX) 40 MG tablet Take 40 mg by mouth 2 (two) times daily.   Yes [provider]  sennosides-docusate sodium (SENOKOT-S) 8.6-50 MG tablet Take 1 tablet by mouth at bedtime as needed for constipation.   Yes [provider]  sertraline (ZOLOFT) 50 MG tablet Take 50 mg by mouth daily.    Yes [provider]  traMADol (ULTRAM) 50 MG tablet Take 1 tablet (50 mg total) by mouth every 12 (twelve) hours as needed for severe pain. 08/04/17 08/04/18 Yes Arta Silence, MD   Allergies  Allergen Reactions  . Sulfa Antibiotics Itching, Swelling, Rash and Other (See Comments)    Reaction:  Facial/body swelling  FAMILY HISTORY:  Family History  Problem Relation Age of Onset  . Heart disease Mother   . Cancer Father   . Cancer Sister   . Breast cancer Sister    SOCIAL HISTORY:  reports that she quit smoking about 2 months ago. Her smoking use included cigarettes. She quit after 40.00 years of use. she has never used smokeless tobacco. She reports that she does not drink alcohol or use drugs.  REVIEW OF SYSTEMS:   Unable to obtain at this time secondary to shortness of breath on noninvasive ventilation  VITAL SIGNS: Pulse Rate:  [65-80] 70 (11/18 0630) Resp:  [16-33] 22 (11/18 0630) BP: (131-239)/(80-108) 132/82 (11/18 0630) SpO2:  [93 %-100 %] 98 % (11/18 0630) Weight:  [61.2 kg (135 lb)] 61.2 kg (135 lb) (11/18 0329)   Physical Examination:   VS: BP 132/82   Pulse 70   Resp (!) 22   Ht 5\' 7"  (1.702 m)   Wt 61.2 kg (135 lb)    SpO2 98%   BMI 21.14 kg/m   General Appearance: Chronically ill appearing female, in respiratory distress on noninvasive ventilation Neuro: Limited exam, no clear deficits noted HEENT: PERRLA, EOM intact, no ptosis, no other lesions noticed;  Pulmonary: Diffuse rales appreciated bilateral posteriorly Cardiovascular  regular rate and rhythm, tachycardia appreciated, sinus mechanism noted on monitor Abdomen: Benign, Soft, non-tender, No masses, hepatosplenomegaly, No lymphadenopathy Endoc: No evident thyromegaly, no signs of acromegaly. Skin:   warm, no rashes, no ecchymosis  Extremities: normal, no cyanosis, clubbing, no edema, warm with normal capillary refill.    LABS: Reviewed   LABORATORY PANEL:   CBC Recent Labs  Lab 10/13/17 0324  WBC 10.2  HGB 10.7*  HCT 33.6*  PLT 295    Chemistries  Recent Labs  Lab 10/13/17 0324  NA 143  K 7.2*  CL 103  CO2 27  GLUCOSE 144*  BUN 65*  CREATININE 7.09*  CALCIUM 7.6*  AST 38  ALT 28  ALKPHOS 136*  BILITOT 0.7    Recent Labs  Lab 10/06/17 1628 10/06/17 2134 10/07/17 0752 10/07/17 1312 10/13/17 0704 10/13/17 0759  GLUCAP 272* 191* 173* 181* 53* 101*   Recent Labs  Lab 10/13/17 0405  PHART 7.15*  PCO2ART 88*  PO2ART 69*   Recent Labs  Lab 10/13/17 0324  AST 38  ALT 28  ALKPHOS 136*  BILITOT 0.7  ALBUMIN 3.9    Cardiac Enzymes Recent Labs  Lab 10/13/17 0831  TROPONINI 0.04*    RADIOLOGY:  Dg Chest Port 1 View  Result Date: 10/13/2017 CLINICAL DATA:  Initial evaluation for acute respiratory distress. EXAM: PORTABLE CHEST 1 VIEW COMPARISON:  Prior radiograph from 12/05/2016. FINDINGS: Right-sided dual-lumen hemodialysis catheter in place, stable. Moderate cardiomegaly. Mediastinal silhouette normal. Aortic atherosclerosis. Lungs normally inflated. Diffuse vascular congestion with interstitial prominence, compatible with pulmonary edema. Small bilateral pleural effusions. No definite focal  infiltrates. No pneumothorax. No acute osseous abnormality. IMPRESSION: 1. Cardiomegaly with moderate diffuse pulmonary edema and small bilateral pleural effusions, consistent with acute CHF exacerbation. 2. Aortic atherosclerosis. Electronically Signed   By: Jeannine Boga M.D.   On: 10/13/2017 04:03    Hermelinda Dellen, DO  10/13/2017, 10:28 AM

## 2017-10-13 NOTE — Progress Notes (Signed)
HD tx end  

## 2017-10-13 NOTE — ED Triage Notes (Signed)
Patient to ED via EMS for respiratory difficulty. At the scene, EMS reports room air O2 sat of 78%. Patient arrives to ED in respiratory distress on BiPAP. Unable to answer questions due to degree of distress. EMS provided 2 inches of nitropaste. Patient with accessory muscle use, retractions, and nasal flaring. Dialysis on Monday, Wednesday and Friday and has not missed any appointments.

## 2017-10-13 NOTE — ED Provider Notes (Signed)
[facility] Emergency Department Provider Note   ____________________________________________   First MD Initiated Contact with Patient 10/13/17 0320     (approximate)  I have reviewed the triage vital signs and the nursing notes.   HISTORY  Chief Complaint Respiratory Distress  Level V caveat: limited by acute respiratory distress  HPI Michele Nash is a 61 y.o. female brought to the ED from home via EMS with a chief complaint of respiratory distress.  Patient has a history of ESRD on HD M/W/F who states she took her dialysis on Friday.  Complains of progressive shortness of breath overnight associated with chest pain.  Initial room air saturation 70% per EMS.  Arrives to the ED on CPAP with 2 inches of Nitropaste applied for hypertension.  Patient states she does make some urine daily.  Denies recent fever, chills, cough, abdominal pain, nausea or vomiting.   Past Medical History:  Diagnosis Date  . Altered mental status   . Anemia   . Apnea, sleep    for sleep study 10/17/15-no cpap yet  . Arthritis   . Bronchitis   . CHF (congestive heart failure) ([place])   . Chronic kidney disease   . COPD (chronic obstructive pulmonary disease) ([place])   . Depression   . Dialysis patient ([place]) 2014  . GERD (gastroesophageal reflux disease)   . GIB (gastrointestinal bleeding) 07/09/2016  . Headache   . Hyperlipidemia   . Hypertension   . Obesity   . Pulmonary edema 09/14/2016  . Renal dialysis device, implant, or graft complication    RIGHT CHEST CATH  . Respiratory failure ([place]) 07/23/2016  . Traumatic hematoma of forehead   . Type II diabetes mellitus ([place]) 09/21/2009   diet controlled    Patient Active Problem List   Diagnosis Date Noted  . Acute respiratory failure ([place]) 10/05/2017  . Hypertensive urgency 08/11/2017  . UTI (urinary tract infection) 06/17/2017  . Dizziness 06/15/2017  . Chronic diastolic CHF (congestive heart failure) ([place])  06/15/2017  . Hyperkalemia 05/21/2017  . Multiple closed fractures of ribs of right side   . Hyperlipidemia 11/28/2016  . Altered mental status   . Fall   . Traumatic hematoma of forehead   . Protein-calorie malnutrition, severe 05/29/2016  . Hyperglycemia 05/28/2016  . Pain in limb 10/28/2015  . ESRD on dialysis ([place]) 10/28/2015  . HTN (hypertension) 03/29/2015  . COPD (chronic obstructive pulmonary disease) ([place]) 03/29/2015  . GAD (generalized anxiety disorder) 03/29/2015  . Chronic kidney disease 10/18/2009  . Personal history of tobacco use, presenting hazards to health 10/18/2009  . Type II diabetes mellitus ([place]) 09/21/2009  . Disorder of uterus 02/14/1990    Past Surgical History:  Procedure Laterality Date  . A/V Shunt Intervention N/A 08/23/2015   Performed by Katha Cabal, MD at [place] [facility]  . A/V Shunt Intervention Left 05/10/2015   Performed by Katha Cabal, MD at [place] [facility]  . A/V Shuntogram/Fistulagram Left 08/23/2015   Performed by Katha Cabal, MD at [place] [facility]  . A/V Shuntogram/Fistulagram N/A 05/10/2015   Performed by Katha Cabal, MD at [facility]  . ABDOMINAL HYSTERECTOMY    . COLONOSCOPY  2011  . COLONOSCOPY WITH PROPOFOL N/A 09/20/2015   Performed by Lucilla Lame, MD at [place]  . DIALYSIS FISTULA CREATION    . Dialysis/Perma Catheter Insertion  08/23/2015   Performed by Katha Cabal, MD at [place] [facility]  . Dialysis/Perma Catheter Removal N/A 01/03/2016   Performed by Katha Cabal, MD at [place] [facility]  . ESOPHAGOGASTRODUODENOSCOPY (EGD) N/A 07/13/2016   Performed by Lollie Sails, MD at [place]  . ESOPHAGOGASTRODUODENOSCOPY (EGD) WITH PROPOFOL N/A 06/02/2016   Performed by Manya Silvas, MD at [place]  . LIGATION OF ARTERIOVENOUS  FISTULA ( PERMCATH INSERTION ) Left 05/24/2017   Performed by Katha Cabal, MD at [facility]  . Resection of  shoulder cyst ( left ) Left 10/28/2015   Performed by Katha Cabal, MD at [facility]  . REVISON OF ARTERIOVENOUS FISTULA WITH ARTEGRAFT Left 10/28/2015   Performed by Katha Cabal, MD at [facility]    Prior to Admission medications   Medication Sig Start Date End Date Taking? Authorizing Provider  albuterol (PROVENTIL HFA;VENTOLIN HFA) 108 (90 Base) MCG/ACT inhaler Inhale 2 puffs every 6 (six) hours as needed into the lungs for wheezing or shortness of breath. 10/07/17   Vaughan Basta, MD  ALPRAZolam Duanne Moron) 0.5 MG tablet Take 0.5 mg by mouth 2 (two) times daily.    [provider]  Amino Acids-Protein Hydrolys (FEEDING SUPPLEMENT, PRO-STAT SUGAR FREE 64,) LIQD Take 30 mLs by mouth daily. 12/12/16   Gouru, Illene Silver, MD  amLODipine (NORVASC) 10 MG tablet Take 10 mg by mouth daily.     [provider]  calcium acetate (PHOSLO) 667 MG capsule Take 3 capsules (2,001 mg total) by mouth 3 (three) times daily with meals. 06/20/17   Loletha Grayer, MD  carvedilol (COREG) 12.5 MG tablet Take 12.5 mg by mouth 2 (two) times daily with a meal.    [provider]  cinacalcet (SENSIPAR) 30 MG tablet Take 60 mg by mouth daily.     [provider]  ezetimibe (ZETIA) 10 MG tablet Take 10 mg by mouth daily.     [provider]  fluticasone (FLONASE) 50 MCG/ACT nasal spray Place 2 sprays into both nostrils daily as needed for rhinitis.    [provider]  furosemide (LASIX) 40 MG tablet Take 40 mg by mouth daily.     [provider]  hydrALAZINE (APRESOLINE) 50 MG tablet Take 1 tablet (50 mg total) by mouth 3 (three) times daily. 08/13/17   Bettey Costa, MD  hydrochlorothiazide (HYDRODIURIL) 25 MG tablet Take 1 tablet (25 mg total) by mouth daily. 09/11/17   Menshew, Dannielle Karvonen, PA-C  ipratropium-albuterol (DUONEB) 0.5-2.5 (3) MG/3ML SOLN Take 3 mLs by nebulization every 6 (six) hours as needed. 12/11/16   Nicholes Mango, MD    lidocaine-prilocaine (EMLA) cream Apply 1 application topically as needed (prior to accessing port).    [provider]  losartan (COZAAR) 100 MG tablet Take 100 mg by mouth daily.     [provider]  multivitamin (RENA-VIT) TABS tablet Take 1 tablet by mouth at bedtime.     [provider]  ondansetron (ZOFRAN) 4 MG tablet Take 1 tablet (4 mg total) by mouth every 8 (eight) hours as needed for nausea or vomiting. 03/24/17   Lisa Roca, MD  pantoprazole (PROTONIX) 40 MG tablet Take 40 mg by mouth 2 (two) times daily.    [provider]  sennosides-docusate sodium (SENOKOT-S) 8.6-50 MG tablet Take 1 tablet by mouth at bedtime as needed for constipation.    [provider]  sertraline (ZOLOFT) 50 MG tablet Take 50 mg by mouth daily.     [provider]  traMADol Veatrice Bourbon)  50 MG tablet Take 1 tablet (50 mg total) by mouth every 12 (twelve) hours as needed for severe pain. 08/04/17 08/04/18  Arta Silence, MD    Allergies Sulfa antibiotics  Family History  Problem Relation Age of Onset  . Heart disease Mother   . Cancer Father   . Cancer Sister   . Breast cancer Sister     Social History Social History   Tobacco Use  . Smoking status: Former Smoker    Years: 40.00    Types: Cigarettes    Last attempt to quit: 07/28/2017    Years since quitting: 0.2  . Smokeless tobacco: Never Used  Substance Use Topics  . Alcohol use: No  . Drug use: No    Review of Systems   Constitutional: No fever/chills. Eyes: No visual changes. ENT: No sore throat. Cardiovascular: Positive for chest pain. Respiratory: Positive for shortness of breath. Gastrointestinal: No abdominal pain.  No nausea, no vomiting.  No diarrhea.  No constipation. Genitourinary: Negative for dysuria. Musculoskeletal: Negative for back pain. Skin: Negative for rash. Neurological: Negative for headaches, focal weakness or  numbness.   ____________________________________________   PHYSICAL EXAM:  VITAL SIGNS: ED Triage Vitals  Enc Vitals Group     BP 10/13/17 0328 (!) 239/108     Pulse Rate 10/13/17 0328 80     Resp 10/13/17 0328 (!) 33     Temp --      Temp src --      SpO2 10/13/17 0328 96 %     Weight 10/13/17 0329 135 lb (61.2 kg)     Height 10/13/17 0329 5\' 7"  (1.702 m)     Head Circumference --      Peak Flow --      Pain Score --      Pain Loc --      Pain Edu? --      Excl. in [place]? --     Constitutional: Alert and oriented. Ill appearing and in moderate to severe acute distress. Eyes: Conjunctivae are normal. PERRL. EOMI. Head: Atraumatic. Nose: No congestion/rhinnorhea. Mouth/Throat: Mucous membranes are moist.  Oropharynx non-erythematous. Neck: No stridor.   Cardiovascular: Normal rate, regular rhythm. Grossly normal heart sounds.  Good peripheral circulation. Respiratory: Increased respiratory effort.  No retractions. Lungs with diffuse rales. Gastrointestinal: Soft and nontender. No distention. No abdominal bruits. No CVA tenderness. Musculoskeletal: LUE AV fistula with palpable thrill.  No lower extremity tenderness nor edema.  No joint effusions. Neurologic:  Normal speech and language. No gross focal neurologic deficits are appreciated.  Skin:  Skin is cool, clammy and intact. No rash noted. Psychiatric: Mood and affect are normal. Speech and behavior are normal.  ____________________________________________   LABS (all labs ordered are listed, but only abnormal results are displayed)  Labs Reviewed  CBC WITH DIFFERENTIAL/PLATELET - Abnormal; Notable for the following components:      Result Value   RBC 3.24 (*)    Hemoglobin 10.7 (*)    HCT 33.6 (*)    MCV 103.7 (*)    MCHC 31.7 (*)    RDW 15.0 (*)    Neutro Abs 7.0 (*)    All other components within normal limits  COMPREHENSIVE METABOLIC PANEL - Abnormal; Notable for the following components:   Potassium 7.2  (*)    Glucose, Bld 144 (*)    BUN 65 (*)    Creatinine, Ser 7.09 (*)    Calcium 7.6 (*)    Alkaline Phosphatase 136 (*)  GFR calc non Af Amer 5 (*)    GFR calc Af Amer 6 (*)    All other components within normal limits  TROPONIN I - Abnormal; Notable for the following components:   Troponin I 0.04 (*)    All other components within normal limits  BLOOD GAS, ARTERIAL   ____________________________________________  EKG  ED ECG REPORT I, Hildred Mollica J, the attending physician, personally viewed and interpreted this ECG.   Date: 10/13/2017  EKG Time: 0317  Rate: 86  Rhythm: normal EKG, normal sinus rhythm  Axis: Normal  Intervals:none  ST&T Change: Hyperacute T waves  ____________________________________________  RADIOLOGY  Dg Chest Port 1 View  Result Date: 10/13/2017 CLINICAL DATA:  Initial evaluation for acute respiratory distress. EXAM: PORTABLE CHEST 1 VIEW COMPARISON:  Prior radiograph from 12/05/2016. FINDINGS: Right-sided dual-lumen hemodialysis catheter in place, stable. Moderate cardiomegaly. Mediastinal silhouette normal. Aortic atherosclerosis. Lungs normally inflated. Diffuse vascular congestion with interstitial prominence, compatible with pulmonary edema. Small bilateral pleural effusions. No definite focal infiltrates. No pneumothorax. No acute osseous abnormality. IMPRESSION: 1. Cardiomegaly with moderate diffuse pulmonary edema and small bilateral pleural effusions, consistent with acute CHF exacerbation. 2. Aortic atherosclerosis. Electronically Signed   By: Jeannine Boga M.D.   On: 10/13/2017 04:03    ____________________________________________   PROCEDURES  Procedure(s) performed: None  Procedures  Critical Care performed: Yes, see critical care note(s)  CRITICAL CARE Performed by: Paulette Blanch   Total critical care time: 45 minutes  Critical care time was exclusive of separately billable procedures and treating other  patients.  Critical care was necessary to treat or prevent imminent or life-threatening deterioration.  Critical care was time spent personally by me on the following activities: development of treatment plan with patient and/or surrogate as well as nursing, discussions with consultants, evaluation of patient's response to treatment, examination of patient, obtaining history from patient or surrogate, ordering and performing treatments and interventions, ordering and review of laboratory studies, ordering and review of radiographic studies, pulse oximetry and re-evaluation of patient's condition. ____________________________________________   INITIAL IMPRESSION / ASSESSMENT AND PLAN / ED COURSE  As part of my medical decision making, I reviewed the following data within the [place] History obtained from family, Nursing notes reviewed and incorporated, Labs reviewed, EKG interpreted, Old chart reviewed, Radiograph reviewed, Discussed with admitting physician and Notes from prior ED visits.   61 year old female with ESRD on HD, COPD who presents in acute respiratory distress. Differential includes, but is not limited to, viral syndrome, bronchitis including COPD exacerbation, pneumonia, reactive airway disease including asthma, CHF including exacerbation with or without pulmonary/interstitial edema, pneumothorax, ACS, thoracic trauma, and pulmonary embolism.  Patient arrives in acute distress, on CPAP, hypertensive.  Will administer IV Lasix and initiate nitroglycerin drip.  Anticipate hospitalization.  Clinical Course as of Oct 13 438  Nancy Fetter Oct 13, 2017  0432 Patient looks more comfortable on BiPAP.  Updated patient and spouse of laboratory and imaging results.  Medicines ordered for hyperkalemia.  Discussed with hospitalist to evaluate patient in the emergency department for admission.  [JS]    Clinical Course User Index [JS] Paulette Blanch, MD      ____________________________________________   FINAL CLINICAL IMPRESSION(S) / ED DIAGNOSES  Final diagnoses:  Acute respiratory failure with hypoxia (HCC)  Acute pulmonary edema (HCC)  Chronic bilateral pleural effusions  Stage 5 chronic kidney disease on chronic dialysis [facility])  Hypertensive urgency  Hyperkalemia     ED Discharge Orders    None  Note:  This document was prepared using Dragon voice recognition software and may include unintentional dictation errors.    Paulette Blanch, MD 10/13/17 [id number]

## 2017-10-13 NOTE — ED Notes (Signed)
Pt easily awakened when called name; repositioned for comfort; warm blankets for comfort; husband has gone home; pt understands ICU in some emergent situations and unable to take report at this time; no complaints or requests; side rails up x 2 and call bell in reach

## 2017-10-13 NOTE — ED Notes (Signed)
Pt arrived from home in respiratory distress, already on BiPAP; crackles throughout and wheezing present; pt with dialysis access to left upper arm and right chest wall; pt says she gets dialysis M-W--F and has not missed any appointments; skin is clammy and diaphoretic; pt unable to tolerate laying down at this time

## 2017-10-13 NOTE — ED Notes (Signed)
Pt denies chest pain; says feeling some better at this time

## 2017-10-13 NOTE — Progress Notes (Signed)
[place], [place] 10/13/17  Subjective:   Patient presented to the emergency room via EMS for respiratory difficulty.  Her oxygen saturation was 78% upon admission.  She was placed on BiPAP.  She was unable to answer any questions due to degree of distress.  Nitropaste was placed.  Initially upon presentation, her pH was 7.15, PCO2 88 Potassium was critically high at 7.2 Urgent hemodialysis is requested this morning Patient is currently on nasal cannula oxygen, sitting up in the chair, requesting diet.  She has already ordered food from the kitchen.  Objective:  Vital signs in last 24 hours:  Pulse Rate:  [65-80] 70 (11/18 0630) Resp:  [16-33] 22 (11/18 0630) BP: (131-239)/(80-108) 132/82 (11/18 0630) SpO2:  [93 %-100 %] 98 % (11/18 0630) Weight:  [61.2 kg (135 lb)] 61.2 kg (135 lb) (11/18 0329)  Weight change:  Filed Weights   10/13/17 0329  Weight: 61.2 kg (135 lb)    Intake/Output:    Intake/Output Summary (Last 24 hours) at 10/13/2017 1002 Last data filed at 10/13/2017 0815 Gross per 24 hour  Intake 100 ml  Output 200 ml  Net -100 ml     Physical Exam: General:  Chronically ill-appearing  HEENT  face appears swollen  Neck  supple, distended neck veins  Pulm/lungs  diffuse bilateral crackles  CVS/Heart  tachycardic, irregular  Abdomen:   Soft, nontender  Extremities:  1-2+ pitting edema  Neurologic:  Alert, oriented  Skin:  No acute rashes  Access:  PermCath and aneurysmal left upper extremity AV fistula       Basic Metabolic Panel:  Recent Labs  Lab 10/13/17 0324  NA 143  K 7.2*  CL 103  CO2 27  GLUCOSE 144*  BUN 65*  CREATININE 7.09*  CALCIUM 7.6*     CBC: Recent Labs  Lab 10/13/17 0324  WBC 10.2  NEUTROABS 7.0*  HGB 10.7*  HCT 33.6*  MCV 103.7*  PLT 295      Lab Results  Component Value Date   HEPBSAG Negative 10/06/2017   HEPBSAB Reactive 10/06/2017      Microbiology:  Recent Results (from  the past 240 hour(s))  MRSA PCR Screening     Status: Abnormal   Collection Time: 10/06/17 12:00 AM  Result Value Ref Range Status   MRSA by PCR POSITIVE (A) NEGATIVE Final    Comment:        The GeneXpert MRSA Assay (FDA approved for NASAL specimens only), is one component of a comprehensive MRSA colonization surveillance program. It is not intended to diagnose MRSA infection nor to guide or monitor treatment for MRSA infections. RESULT CALLED TO, READ BACK BY AND VERIFIED WITH: ERICA TAYLOR AT 0204 10/06/17.PMH     Coagulation Studies: No results for input(s): LABPROT, INR in the last 72 hours.  Urinalysis: No results for input(s): COLORURINE, LABSPEC, PHURINE, GLUCOSEU, HGBUR, BILIRUBINUR, KETONESUR, PROTEINUR, UROBILINOGEN, NITRITE, LEUKOCYTESUR in the last 72 hours.  Invalid input(s): APPERANCEUR    Imaging: Dg Chest Port 1 View  Result Date: 10/13/2017 CLINICAL DATA:  Initial evaluation for acute respiratory distress. EXAM: PORTABLE CHEST 1 VIEW COMPARISON:  Prior radiograph from 12/05/2016. FINDINGS: Right-sided dual-lumen hemodialysis catheter in place, stable. Moderate cardiomegaly. Mediastinal silhouette normal. Aortic atherosclerosis. Lungs normally inflated. Diffuse vascular congestion with interstitial prominence, compatible with pulmonary edema. Small bilateral pleural effusions. No definite focal infiltrates. No pneumothorax. No acute osseous abnormality. IMPRESSION: 1. Cardiomegaly with moderate diffuse pulmonary edema and small bilateral pleural effusions, consistent with  acute CHF exacerbation. 2. Aortic atherosclerosis. Electronically Signed   By: Jeannine Boga M.D.   On: 10/13/2017 04:03     Medications:    . calcium gluconate      . docusate sodium  100 mg Oral BID  . heparin  5,000 Units Subcutaneous Q8H   acetaminophen **OR** acetaminophen, ondansetron **OR** ondansetron (ZOFRAN) IV  Assessment/ Plan:  61 y.o. African-American female with  end stage renal disease on hemodialysis, hypertension, congestive heart failure, COPD/tobacco abuse, diabetes mellitus type II, depression, obstructive sleep apnea, peripheral vascular disease,  Patient presents back to the hospital within a week for similar complaints of hyperkalemia, pulmonary edema, respiratory failure requiring BiPAP.  Patient denies missing any dialysis treatments.  MWF [address]  180 min/ 59.5 kg  1.  End-stage renal disease with hyperkalemia, acute pulmonary edema, hypercarbic respiratory failure requiring noninvasive positive pressure ventilation Urgent hemodialysis this morning through right IJ PermCath Volume removal of 2-3 kg as tolerated Low potassium bath for hyperkalemia.  Patient received Kayexalate earlier today. Low potassium diet and fluid restriction was discussed with patient.  She seems to understand.  She does admit to eating potato chips which would lead to hyperkalemia Change Lasix to torsemide 100 mg daily Dialysis orders are in place and nursing staff has been informed  2.  Anemia of chronic kidney disease Hemoglobin 10.7 Resume appropriate on normal hemodialysis days  3.  Secondary hyperparathyroidism Monitor phosphorus during hospital stay Resume home dose of binders and cinacalcet when she is able to eat normal diet     LOS: 0 [facility] 11/18/201810:02 [place], [place]

## 2017-10-13 NOTE — ED Notes (Signed)
Pt was able to tolerate removal of mask twice, one time each for each bottle of kayexalate;

## 2017-10-13 NOTE — ED Notes (Signed)
Attempted to call report to ICU; nurse receiving pt currently unavailable to take report

## 2017-10-13 NOTE — ED Notes (Signed)
Dr Beather Arbour notified of Troponin 0.04 and Potassium 7.2; acknowledged and entering orders

## 2017-10-14 MED ORDER — PANTOPRAZOLE SODIUM 40 MG PO TBEC
40.0000 mg | DELAYED_RELEASE_TABLET | Freq: Two times a day (BID) | ORAL | Status: DC
  Administered 2017-10-14: 40 mg via ORAL
  Filled 2017-10-14: qty 1

## 2017-10-14 MED ORDER — PANTOPRAZOLE SODIUM 40 MG PO TBEC
40.0000 mg | DELAYED_RELEASE_TABLET | Freq: Every day | ORAL | Status: DC
  Administered 2017-10-14 – 2017-10-15 (×2): 40 mg via ORAL
  Filled 2017-10-14 (×2): qty 1

## 2017-10-14 MED ORDER — HYDROCODONE-ACETAMINOPHEN 5-325 MG PO TABS
1.0000 | ORAL_TABLET | Freq: Once | ORAL | Status: AC
  Administered 2017-10-14: 1 via ORAL
  Filled 2017-10-14: qty 1

## 2017-10-14 MED ORDER — HYDRALAZINE HCL 20 MG/ML IJ SOLN
INTRAMUSCULAR | Status: AC
  Administered 2017-10-14: 10 mg via INTRAVENOUS
  Filled 2017-10-14: qty 1

## 2017-10-14 MED ORDER — SENNOSIDES-DOCUSATE SODIUM 8.6-50 MG PO TABS: 1.0000 | ORAL_TABLET | Freq: Every evening | ORAL | Status: DC | PRN

## 2017-10-14 MED ORDER — CARVEDILOL 12.5 MG PO TABS
12.5000 mg | ORAL_TABLET | Freq: Two times a day (BID) | ORAL | Status: DC
  Administered 2017-10-14 (×3): 12.5 mg via ORAL
  Filled 2017-10-14 (×3): qty 1

## 2017-10-14 MED ORDER — IPRATROPIUM-ALBUTEROL 0.5-2.5 (3) MG/3ML IN SOLN: 3.0000 mL | Freq: Four times a day (QID) | RESPIRATORY_TRACT | Status: DC | PRN

## 2017-10-14 MED ORDER — EZETIMIBE 10 MG PO TABS
10.0000 mg | ORAL_TABLET | Freq: Every day | ORAL | Status: DC
  Administered 2017-10-14 – 2017-10-15 (×2): 10 mg via ORAL
  Filled 2017-10-14 (×2): qty 1

## 2017-10-14 MED ORDER — ACETIC ACID-ALUMINUM ACETATE 2 % OT SOLN
4.0000 [drp] | OTIC | Status: DC
  Filled 2017-10-14: qty 60

## 2017-10-14 MED ORDER — HYDROCORTISONE-ACETIC ACID 1-2 % OT SOLN
4.0000 [drp] | Freq: Four times a day (QID) | OTIC | Status: DC
  Administered 2017-10-14 – 2017-10-15 (×4): 4 [drp] via OTIC
  Filled 2017-10-14: qty 10

## 2017-10-14 MED ORDER — PRO-STAT SUGAR FREE PO LIQD: 30.0000 mL | Freq: Every day | ORAL | Status: DC

## 2017-10-14 MED ORDER — LOSARTAN POTASSIUM 50 MG PO TABS
100.0000 mg | ORAL_TABLET | Freq: Every day | ORAL | Status: DC
  Administered 2017-10-14 – 2017-10-15 (×2): 100 mg via ORAL
  Filled 2017-10-14 (×2): qty 2

## 2017-10-14 MED ORDER — MONTELUKAST SODIUM 10 MG PO TABS
10.0000 mg | ORAL_TABLET | Freq: Every day | ORAL | Status: DC
  Administered 2017-10-14: 10 mg via ORAL
  Filled 2017-10-14: qty 1

## 2017-10-14 MED ORDER — SERTRALINE HCL 50 MG PO TABS
50.0000 mg | ORAL_TABLET | Freq: Every day | ORAL | Status: DC
  Administered 2017-10-14 – 2017-10-15 (×2): 50 mg via ORAL
  Filled 2017-10-14 (×2): qty 1

## 2017-10-14 MED ORDER — HYDRALAZINE HCL 20 MG/ML IJ SOLN
10.0000 mg | Freq: Once | INTRAMUSCULAR | Status: AC
  Administered 2017-10-14: 10 mg via INTRAVENOUS

## 2017-10-14 MED ORDER — AMLODIPINE BESYLATE 10 MG PO TABS
10.0000 mg | ORAL_TABLET | Freq: Every day | ORAL | Status: DC
  Administered 2017-10-14 – 2017-10-15 (×3): 10 mg via ORAL
  Filled 2017-10-14 (×4): qty 1

## 2017-10-14 MED ORDER — LIDOCAINE-PRILOCAINE 2.5-2.5 % EX CREA
1.0000 "application " | TOPICAL_CREAM | CUTANEOUS | Status: DC | PRN
  Filled 2017-10-14: qty 5

## 2017-10-14 MED ORDER — RENA-VITE PO TABS
1.0000 | ORAL_TABLET | Freq: Every day | ORAL | Status: DC
  Administered 2017-10-14 (×2): 1 via ORAL
  Filled 2017-10-14 (×2): qty 1

## 2017-10-14 MED ORDER — ALPRAZOLAM 0.5 MG PO TABS: 0.5000 mg | ORAL_TABLET | Freq: Two times a day (BID) | ORAL | Status: DC | PRN

## 2017-10-14 MED ORDER — SENNA-DOCUSATE SODIUM 8.6-50 MG PO TABS: 1.0000 | ORAL_TABLET | Freq: Every evening | ORAL | Status: DC | PRN

## 2017-10-14 MED ORDER — HYDRALAZINE HCL 50 MG PO TABS
100.0000 mg | ORAL_TABLET | Freq: Three times a day (TID) | ORAL | Status: DC
  Administered 2017-10-14 – 2017-10-15 (×4): 100 mg via ORAL
  Filled 2017-10-14 (×4): qty 2

## 2017-10-14 MED ORDER — HYDRALAZINE HCL 50 MG PO TABS: 50.0000 mg | ORAL_TABLET | Freq: Three times a day (TID) | ORAL | Status: DC

## 2017-10-14 MED ORDER — CINACALCET HCL 30 MG PO TABS
60.0000 mg | ORAL_TABLET | Freq: Every day | ORAL | Status: DC
  Administered 2017-10-14 – 2017-10-15 (×2): 60 mg via ORAL
  Filled 2017-10-14 (×2): qty 2

## 2017-10-14 MED ORDER — HYDRALAZINE HCL 20 MG/ML IJ SOLN
10.0000 mg | INTRAMUSCULAR | Status: DC | PRN
  Administered 2017-10-14: 20 mg via INTRAVENOUS
  Filled 2017-10-14: qty 1

## 2017-10-14 MED ORDER — FLUTICASONE PROPIONATE 50 MCG/ACT NA SUSP
2.0000 | Freq: Every day | NASAL | Status: DC | PRN
  Administered 2017-10-14: 2 via NASAL
  Filled 2017-10-14 (×2): qty 16

## 2017-10-14 MED ORDER — CALCIUM ACETATE (PHOS BINDER) 667 MG PO CAPS
2001.0000 mg | ORAL_CAPSULE | Freq: Three times a day (TID) | ORAL | Status: DC
  Administered 2017-10-14 – 2017-10-15 (×5): 2001 mg via ORAL
  Filled 2017-10-14 (×5): qty 3

## 2017-10-14 MED ORDER — TRAMADOL HCL 50 MG PO TABS
50.0000 mg | ORAL_TABLET | Freq: Two times a day (BID) | ORAL | Status: DC | PRN
  Administered 2017-10-14: 50 mg via ORAL
  Filled 2017-10-14: qty 1

## 2017-10-14 NOTE — Plan of Care (Signed)
Pt. Restarted on home BP medications and titrated off of nitro gtt (see MAR for details) Pt. OOB to [facility] with minimal assistance, on home O2 of 4 L [place].   HD scheduled for today.  No other care concerns at this time.

## 2017-10-14 NOTE — Progress Notes (Signed)
HD completed without issue. Unable to meet fluid goal due to patient cramping for last 30 minutes of treatment. Report called to primary RN

## 2017-10-14 NOTE — Progress Notes (Signed)
Post hd assessment unchanged  

## 2017-10-14 NOTE — Progress Notes (Signed)
Pre hd 

## 2017-10-14 NOTE — Progress Notes (Signed)
[place] at [facility]                                                                                                                                                                                  Patient Demographics   Michele Nash, is a 61 y.o. female, DOB - 23-Sep-1956, RSW:[id number]  Admit date - 10/13/2017   Admitting Physician Harrie Foreman, MD  Outpatient Primary MD for the patient is [name], MD   LOS - 1  Subjective: Patient states that she has nasal congestion pain in the years She is currently on 4 L of oxygen  Review of Systems:   CONSTITUTIONAL: No documented fever. No fatigue, weakness. No weight gain, no weight loss.  EYES: No blurry or double vision.  ENT: No tinnitus. No postnasal drip. No redness of the oropharynx.  RESPIRATORY: No cough, no wheeze, no hemoptysis.  Positive dyspnea.  CARDIOVASCULAR: No chest pain. No orthopnea. No palpitations. No syncope.  GASTROINTESTINAL: No nausea, no vomiting or diarrhea. No abdominal pain. No melena or hematochezia.  GENITOURINARY: No dysuria or hematuria.  ENDOCRINE: No polyuria or nocturia. No heat or cold intolerance.  HEMATOLOGY: No anemia. No bruising. No bleeding.  INTEGUMENTARY: No rashes. No lesions.  MUSCULOSKELETAL: No arthritis. No swelling. No gout.  NEUROLOGIC: No numbness, tingling, or ataxia. No seizure-type activity.  PSYCHIATRIC: No anxiety. No insomnia. No ADD.    Vitals:   Vitals:   10/14/17 1345 10/14/17 1346 10/14/17 1400 10/14/17 1415  BP: (!) 152/76 (!) 152/76 (!) 158/82 (!) 162/75  Pulse:  69 68 72  Resp:  20 19 20   Temp:      TempSrc:      SpO2:  100% 97% 94%  Weight:      Height:        Wt Readings from Last 3 Encounters:  10/14/17 135 lb 2.3 oz (61.3 kg)  10/07/17 135 lb 9.3 oz (61.5 kg)  09/24/17 129 lb 2 oz (58.6 kg)     Intake/Output Summary (Last 24 hours) at 10/14/2017 1431 Last data filed at 10/14/2017 0657 Gross per 24 hour   Intake 998.75 ml  Output 3315 ml  Net -2316.25 ml    Physical Exam:   GENERAL: Pleasant-appearing in no apparent distress.  HEAD, EYES, EARS, NOSE AND THROAT: Atraumatic, normocephalic. Extraocular muscles are intact. Pupils equal and reactive to light. Sclerae anicteric. No conjunctival injection. No oro-pharyngeal erythema.  NECK: Supple. There is no jugular venous distention. No bruits, no lymphadenopathy, no thyromegaly.  HEART: Regular rate and rhythm,. No murmurs, no rubs, no clicks.  LUNGS: Crackles at the bases. No rales or rhonchi. No wheezes.  ABDOMEN:  Soft, flat, nontender, nondistended. Has good bowel sounds. No hepatosplenomegaly appreciated.  EXTREMITIES: No evidence of any cyanosis, clubbing, or peripheral edema.  +2 pedal and radial pulses bilaterally.  NEUROLOGIC: The patient is alert, awake, and oriented x3 with no focal motor or sensory deficits appreciated bilaterally.  SKIN: Moist and warm with no rashes appreciated.  Psych: Not anxious, depressed LN: No inguinal LN enlargement    Antibiotics   Anti-infectives (From admission, onward)   None      Medications   Scheduled Meds: . acetic acid-hydrocortisone  4 drop Both EARS Q6H  . amLODipine  10 mg Oral Daily  . calcium acetate  2,001 mg Oral TID WC  . carvedilol  12.5 mg Oral BID WC  . Chlorhexidine Gluconate Cloth  6 each Topical Q0600  . cinacalcet  60 mg Oral Daily  . docusate sodium  100 mg Oral BID  . epoetin (EPOGEN/PROCRIT) injection  4,000 Units Intravenous Q M,W,F-HD  . ezetimibe  10 mg Oral Daily  . feeding supplement (PRO-STAT SUGAR FREE 64)  30 mL Oral Daily  . heparin  5,000 Units Subcutaneous Q8H  . hydrALAZINE  100 mg Oral TID  . losartan  100 mg Oral Daily  . mouth rinse  15 mL Mouth Rinse BID  . montelukast  10 mg Oral QHS  . multivitamin  1 tablet Oral QHS  . mupirocin ointment  1 application Nasal BID  . pantoprazole  40 mg Oral Daily  . sertraline  50 mg Oral Daily  .  torsemide  100 mg Oral Daily   Continuous Infusions: PRN Meds:.acetaminophen **OR** acetaminophen, ALPRAZolam, fluticasone, hydrALAZINE, ipratropium-albuterol, lidocaine-prilocaine, ondansetron **OR** ondansetron (ZOFRAN) IV, senna-docusate, traMADol   Data Review:   Micro Results Recent Results (from the past 240 hour(s))  MRSA PCR Screening     Status: Abnormal   Collection Time: 10/06/17 12:00 AM  Result Value Ref Range Status   MRSA by PCR POSITIVE (A) NEGATIVE Final    Comment:        The GeneXpert MRSA Assay (FDA approved for NASAL specimens only), is one component of a comprehensive MRSA colonization surveillance program. It is not intended to diagnose MRSA infection nor to guide or monitor treatment for MRSA infections. RESULT CALLED TO, READ BACK BY AND VERIFIED WITH: ERICA TAYLOR AT 0204 10/06/17.[place]     Radiology Reports Dg Chest Port 1 View  Result Date: 10/13/2017 CLINICAL DATA:  Initial evaluation for acute respiratory distress. EXAM: PORTABLE CHEST 1 VIEW COMPARISON:  Prior radiograph from 12/05/2016. FINDINGS: Right-sided dual-lumen hemodialysis catheter in place, stable. Moderate cardiomegaly. Mediastinal silhouette normal. Aortic atherosclerosis. Lungs normally inflated. Diffuse vascular congestion with interstitial prominence, compatible with pulmonary edema. Small bilateral pleural effusions. No definite focal infiltrates. No pneumothorax. No acute osseous abnormality. IMPRESSION: 1. Cardiomegaly with moderate diffuse pulmonary edema and small bilateral pleural effusions, consistent with acute CHF exacerbation. 2. Aortic atherosclerosis. Electronically Signed   By: Jeannine Boga M.D.   On: 10/13/2017 04:03   Dg Chest Portable 1 View  Result Date: 10/05/2017 CLINICAL DATA:  Dyspnea EXAM: PORTABLE CHEST 1 VIEW COMPARISON:  08/11/2017 chest radiograph. FINDINGS: Right internal jugular central venous catheter terminates in the middle third of the superior  vena cava. Stable cardiomediastinal silhouette with mild cardiomegaly and aortic atherosclerosis. No pneumothorax. Trace bilateral pleural effusions. Hazy and linear parahilar lung opacities in both lungs, compatible with mild-to-moderate pulmonary edema. IMPRESSION: Mild-to-moderate congestive heart failure with trace bilateral pleural effusions. Electronically Signed   By: Ilona Sorrel  M.D.   On: 10/05/2017 19:53     CBC Recent Labs  Lab 10/13/17 0324  WBC 10.2  HGB 10.7*  HCT 33.6*  PLT 295  MCV 103.7*  MCH 32.9  MCHC 31.7*  RDW 15.0*  LYMPHSABS 1.8  MONOABS 0.9  EOSABS 0.4  BASOSABS 0.1    Chemistries  Recent Labs  Lab 10/13/17 0324 10/13/17 1655  NA 143  --   K 7.2* 3.8  CL 103  --   CO2 27  --   GLUCOSE 144*  --   BUN 65*  --   CREATININE 7.09*  --   CALCIUM 7.6*  --   AST 38  --   ALT 28  --   ALKPHOS 136*  --   BILITOT 0.7  --    ------------------------------------------------------------------------------------------------------------------ estimated creatinine clearance is 7.9 mL/min (A) (by C-G formula based on SCr of 7.09 mg/dL (H)). ------------------------------------------------------------------------------------------------------------------ No results for input(s): HGBA1C in the last 72 hours. ------------------------------------------------------------------------------------------------------------------ No results for input(s): CHOL, HDL, LDLCALC, TRIG, CHOLHDL, LDLDIRECT in the last 72 hours. ------------------------------------------------------------------------------------------------------------------ No results for input(s): TSH, T4TOTAL, T3FREE, THYROIDAB in the last 72 hours.  Invalid input(s): FREET3 ------------------------------------------------------------------------------------------------------------------ No results for input(s): VITAMINB12, FOLATE, FERRITIN, TIBC, IRON, RETICCTPCT in the last 72 hours.  Coagulation  profile No results for input(s): INR, PROTIME in the last 168 hours.  No results for input(s): DDIMER in the last 72 hours.  Cardiac Enzymes Recent Labs  Lab 10/13/17 0831 10/13/17 1408 10/13/17 2000  TROPONINI 0.04* 0.04* 0.05*   ------------------------------------------------------------------------------------------------------------------ Invalid input(s): POCBNP    Assessment & Plan   This is a 61 year old female admitted for respiratory failure. 1.  Respiratory failure: Acute on chronic; with hypoxia and hypercapnia.    This is related to acute on chronic diastolic CHF, now improved status post hemodialysis, patient complains of nasal congestion she is to continue her Flonase I will add Singulair also complains of ear discomfort I have started on some eardrops for pain 2.  Hyperkalemia: Resolved with appropriate interventions 3.  ESRD: Limit fluid intake.  Continue Sensipar and PhosLo.  Nephrology is aware of the patient's admission and will be dialyzing her some  4.  CHF: Acute on chronic diastolic failure.  Managed blood pressure.    Resume carvedilol 5.  Hypertension: Uncontrolled; continue amlodipine, hydralazine, hydrochlorothiazide and losartan.  As needed PRN hydralazine 6.  Depression: Continue sertraline 7.  DVT prophylaxis: Heparin 8.  GI prophylaxis: Pantoprazole per home regimen       Code Status Orders  (From admission, onward)        Start     Ordered   10/13/17 0801  Full code  Continuous     10/13/17 0800    Code Status History    Date Active Date Inactive Code Status Order ID Comments User Context   10/05/2017 23:59 10/07/2017 17:45 Full Code [id number]  Gorden Harms, MD Inpatient   08/11/2017 17:56 08/13/2017 16:15 Full Code [id number]  Vaughan Basta, MD Inpatient   06/15/2017 19:42 06/20/2017 16:52 Full Code [id number]  Idelle Crouch, MD Inpatient   05/21/2017 17:58 05/22/2017 17:24 Full Code [id number]  Nicholes Mango, MD Inpatient    12/04/2016 09:55 12/11/2016 17:41 Full Code [id number]  Fritzi Mandes, MD Inpatient   10/29/2016 12:14 10/30/2016 19:43 Full Code [id number]  Laverle Hobby, MD ED   10/06/2016 20:33 10/11/2016 15:54 Full Code [id number]  Mikael Spray, NP ED   09/14/2016 09:19 09/16/2016 16:07 Full Code [id number]  Laverle Hobby, MD ED  07/23/2016 11:05 07/24/2016 22:49 Full Code [id number]  Wilhelmina Mcardle, MD ED   07/09/2016 18:43 07/13/2016 17:23 Full Code [id number]  Lytle Butte, MD ED   05/28/2016 14:54 06/02/2016 14:51 Full Code [id number]  Theodoro Grist, MD Inpatient   04/25/2016 18:04 04/26/2016 21:17 Full Code [id number]  Nicholes Mango, MD Inpatient   10/28/2015 18:50 10/30/2015 18:09 Full Code [id number]  Theodoro Grist, MD Inpatient   03/30/2015 00:51 03/31/2015 16:44 Full Code [id number]  Juluis Mire, MD Inpatient           Consults nephrology, intensivist  DVT Prophylaxis heparin  Lab Results  Component Value Date   PLT 295 10/13/2017     Time Spent in minutes   35 minutes of additional critical care time spent greater than 50% of time spent in care coordination and counseling patient regarding the condition and plan of care.   Dustin Flock M.D on 10/14/2017 at 2:31 PM  Between 7am to 6pm - Pager - [phone]  After 6pm go to [email or web address] - password [email or web address] [facility]   Office  [phone]

## 2017-10-14 NOTE — Progress Notes (Signed)
Michele Nash, [place] 10/14/17  Subjective:  Patient underwent hemodialysis yesterday. She tolerated this quite well. Overall doing much better.   Objective:  Vital signs in last 24 hours:  Temp:  [97.7 F (36.5 C)-99.6 F (37.6 C)] 98.8 F (37.1 C) (11/19 0200) Pulse Rate:  [72-96] 73 (11/19 0600) Resp:  [14-34] 17 (11/19 0600) BP: (123-200)/(63-135) 180/98 (11/19 0600) SpO2:  [87 %-100 %] 99 % (11/19 0600) Weight:  [60.9 kg (134 lb 4.2 oz)-63.9 kg (140 lb 14 oz)] 61.3 kg (135 lb 2.3 oz) (11/19 0500)  Weight change: 2.664 kg (5 lb 14 oz) Filed Weights   10/13/17 1430 10/13/17 1840 10/14/17 0500  Weight: 63.9 kg (140 lb 14 oz) 60.9 kg (134 lb 4.2 oz) 61.3 kg (135 lb 2.3 oz)    Intake/Output:    Intake/Output Summary (Last 24 hours) at 10/14/2017 0849 Last data filed at 10/14/2017 3716 Gross per 24 hour  Intake 1079.75 ml  Output 3815 ml  Net -2735.25 ml     Physical Exam: General:  Chronically ill-appearing  HEENT  facial swelling  Neck  supple, distended neck veins  Pulm/lungs  minimal rales  CVS/Heart  S1S2 no rubs  Abdomen:   Soft, nontender  Extremities:  trace LE edema  Neurologic:  Alert, oriented  Skin:  No acute rashes  Access:  PermCath and aneurysmal left upper extremity AV fistula       Basic Metabolic Panel:  Recent Labs  Lab 10/13/17 0324 10/13/17 1655  NA 143  --   K 7.2* 3.8  CL 103  --   CO2 27  --   GLUCOSE 144*  --   BUN 65*  --   CREATININE 7.09*  --   CALCIUM 7.6*  --      CBC: Recent Labs  Lab 10/13/17 0324  WBC 10.2  NEUTROABS 7.0*  HGB 10.7*  HCT 33.6*  MCV 103.7*  PLT 295      Lab Results  Component Value Date   HEPBSAG Negative 10/06/2017   HEPBSAB Reactive 10/06/2017      Microbiology:  Recent Results (from the past 240 hour(s))  MRSA PCR Screening     Status: Abnormal   Collection Time: 10/06/17 12:00 AM  Result Value Ref Range Status   MRSA by PCR POSITIVE (A)  NEGATIVE Final    Comment:        The GeneXpert MRSA Assay (FDA approved for NASAL specimens only), is one component of a comprehensive MRSA colonization surveillance program. It is not intended to diagnose MRSA infection nor to guide or monitor treatment for MRSA infections. RESULT CALLED TO, READ BACK BY AND VERIFIED WITH: ERICA TAYLOR AT 0204 10/06/17.[facility]     Coagulation Studies: No results for input(s): LABPROT, INR in the last 72 hours.  Urinalysis: No results for input(s): COLORURINE, LABSPEC, PHURINE, GLUCOSEU, HGBUR, BILIRUBINUR, KETONESUR, PROTEINUR, UROBILINOGEN, NITRITE, LEUKOCYTESUR in the last 72 hours.  Invalid input(s): APPERANCEUR    Imaging: Dg Chest Port 1 View  Result Date: 10/13/2017 CLINICAL DATA:  Initial evaluation for acute respiratory distress. EXAM: PORTABLE CHEST 1 VIEW COMPARISON:  Prior radiograph from 12/05/2016. FINDINGS: Right-sided dual-lumen hemodialysis catheter in place, stable. Moderate cardiomegaly. Mediastinal silhouette normal. Aortic atherosclerosis. Lungs normally inflated. Diffuse vascular congestion with interstitial prominence, compatible with pulmonary edema. Small bilateral pleural effusions. No definite focal infiltrates. No pneumothorax. No acute osseous abnormality. IMPRESSION: 1. Cardiomegaly with moderate diffuse pulmonary edema and small bilateral pleural effusions, consistent with acute CHF exacerbation. 2.  Aortic atherosclerosis. Electronically Signed   By: Jeannine Boga M.D.   On: 10/13/2017 04:03     Medications:    . acetic acid-aluminum acetate  4 drop Both EARS Q3H  . amLODipine  10 mg Oral Daily  . calcium acetate  2,001 mg Oral TID WC  . carvedilol  12.5 mg Oral BID WC  . Chlorhexidine Gluconate Cloth  6 each Topical Q0600  . cinacalcet  60 mg Oral Daily  . docusate sodium  100 mg Oral BID  . epoetin (EPOGEN/PROCRIT) injection  4,000 Units Intravenous Q M,W,F-HD  . ezetimibe  10 mg Oral Daily  . feeding  supplement (PRO-STAT SUGAR FREE 64)  30 mL Oral Daily  . heparin  5,000 Units Subcutaneous Q8H  . hydrALAZINE  100 mg Oral TID  . losartan  100 mg Oral Daily  . mouth rinse  15 mL Mouth Rinse BID  . montelukast  10 mg Oral QHS  . multivitamin  1 tablet Oral QHS  . mupirocin ointment  1 application Nasal BID  . pantoprazole  40 mg Oral Daily  . sertraline  50 mg Oral Daily  . torsemide  100 mg Oral Daily   acetaminophen **OR** acetaminophen, ALPRAZolam, fluticasone, hydrALAZINE, ipratropium-albuterol, lidocaine-prilocaine, ondansetron **OR** ondansetron (ZOFRAN) IV, senna-docusate, traMADol  Assessment/ Plan:  61 y.o. African-American female with end stage renal disease on hemodialysis, hypertension, congestive heart failure, COPD/tobacco abuse, diabetes mellitus type II, depression, obstructive sleep apnea, peripheral vascular disease,  Patient presents back to the hospital within a week for similar complaints of hyperkalemia, pulmonary edema, respiratory failure requiring BiPAP.  Patient denies missing any dialysis treatments.  MWF [address]  180 min/ 59.5 kg  1.  End-stage renal disease with hyperkalemia, acute pulmonary edema, hypercarbic respiratory failure requiring noninvasive positive pressure ventilation upon admission. Patient doing much better this a.m.  She is scheduled for another dialysis treatment today. Potassium down to 3.8 today.  2.  Anemia of chronic kidney disease Hemoglobin stable at the moment.  Continue to monitor.  3.  Secondary hyperparathyroidism Continue to periodically monitor serum phosphorus during the course of the hospitalization.     LOS: 1 Michele Nash 11/19/20188:49 AM  [place], [place]

## 2017-10-14 NOTE — Progress Notes (Signed)
Pre hd assessment  

## 2017-10-14 NOTE — Progress Notes (Signed)
1800 returned from Hemodialysis department.

## 2017-10-14 NOTE — Progress Notes (Signed)
1315 Patient transported down to Dialysis unit for hemodialysis treatment.

## 2017-10-14 NOTE — Progress Notes (Signed)
Initial Nutrition Assessment  DOCUMENTATION CODES:   Not applicable  INTERVENTION:  Continue Pro-Stat 30 mL po once daily, each supplement provides 100 kcal and 30 grams protein.  Continue Rena-vite QHS.  Encouraged adequate intake of calories and protein at meals.  NUTRITION DIAGNOSIS:   Increased nutrient needs related to catabolic illness(ESRD on HD) as evidenced by estimated needs.  GOAL:   Patient will meet greater than or equal to 90% of their needs  MONITOR:   PO intake, Supplement acceptance, Labs, Weight trends, I & O's  REASON FOR ASSESSMENT:   Malnutrition Screening Tool    ASSESSMENT:   61 year old female with PMHx of ESRD on HD, COPD, HTN, HLD, depression, GERD, DM type 2, CHF admitted with hyperkalemia, pulmonary edema, respiratory failure.   Met with patient at bedside. She reports she has been a dialysis patient for over 5 years now. She reports she understands diet for HD and does not need anymore education. Her fluid restriction is 32 fl ounces. She reports eating breakfast and dinner daily. She is unable to describe what she eats at these meals. She reports she eats well though and she feels like her appetite is at baseline. Takes Pro-Stat once daily and wants to continue to do so here.  Patient is unsure of her dry weight. She reports she is weight stable. Per review of weight history patient has been fairly weight stable this past year.  Medications reviewed and include: calcium acetate 2001 mg TID, Sensipar 60 mg daily, Colace, Epogen 4000 units with HD, Rena-vite QHS, pantoprazole.  Labs reviewed: CBG 53-178, Potassium 7.2, BUN 65, Creatinine 7.02. Phosphorus 3.6 on 11/11.  Patient does not meet criteria for malnutrition at this time.  Discussed with RN.  NUTRITION - FOCUSED PHYSICAL EXAM:    Most Recent Value  Orbital Region  No depletion  Upper Arm Region  No depletion  Thoracic and Lumbar Region  No depletion  Buccal Region  No depletion   Temple Region  No depletion  Clavicle Bone Region  No depletion  Clavicle and Acromion Bone Region  No depletion  Scapular Bone Region  No depletion  Dorsal Hand  No depletion  Patellar Region  No depletion  Anterior Thigh Region  No depletion  Posterior Calf Region  No depletion  Edema (RD Assessment)  Mild  Hair  Reviewed  Eyes  Reviewed  Mouth  Reviewed  Skin  Reviewed  Nails  Reviewed     Diet Order:  Diet renal with fluid restriction Fluid restriction: 1200 mL Fluid; Room service appropriate? Yes; Fluid consistency: Thin  EDUCATION NEEDS:   Not appropriate for education at this time(Patient refuses education.)  Skin:  Skin Assessment: Reviewed RN Assessment  Last BM:  10/13/2017 - small type 3  Height:   Ht Readings from Last 1 Encounters:  10/13/17 _0  (1.702 m)    Weight:   Wt Readings from Last 1 Encounters:  10/14/17 135 lb 2.3 oz (61.3 kg)    Ideal Body Weight:  61.4 kg  BMI:  Body mass index is 21.17 kg/m.  Estimated Nutritional Needs:   Kcal:  1565-1805 (MSJ x 1.3-1.5)  Protein:  75-85 grams (1.2-1.4 grams/kg)  Fluid:  UOP + 1 L  Willey Blade, MS, RD, LDN Office: [phone] Pager: [phone] After Hours/Weekend Pager: [phone]

## 2017-10-15 ENCOUNTER — Inpatient Hospital Stay: Payer: Medicare Other

## 2017-10-15 ENCOUNTER — Ambulatory Visit: Payer: Medicare Other | Admitting: Internal Medicine

## 2017-10-15 MED ORDER — HYDRALAZINE HCL 50 MG PO TABS: 100.0000 mg | ORAL_TABLET | Freq: Three times a day (TID) | ORAL | 0 refills | Status: AC

## 2017-10-15 MED ORDER — TORSEMIDE 100 MG PO TABS: 100.0000 mg | ORAL_TABLET | Freq: Every day | ORAL | 0 refills | Status: AC

## 2017-10-15 MED ORDER — HYDRALAZINE HCL 20 MG/ML IJ SOLN: 10.0000 mg | INTRAMUSCULAR | Status: DC | PRN

## 2017-10-15 MED ORDER — PREDNISONE 50 MG PO TABS
50.0000 mg | ORAL_TABLET | Freq: Once | ORAL | Status: AC
  Administered 2017-10-15: 50 mg via ORAL
  Filled 2017-10-15: qty 1

## 2017-10-15 MED ORDER — MONTELUKAST SODIUM 10 MG PO TABS: 10.0000 mg | ORAL_TABLET | Freq: Every day | ORAL | 0 refills | Status: AC

## 2017-10-15 MED ORDER — CLONIDINE HCL 0.1 MG PO TABS
0.2000 mg | ORAL_TABLET | Freq: Two times a day (BID) | ORAL | Status: DC
  Administered 2017-10-15: 0.2 mg via ORAL
  Filled 2017-10-15: qty 2

## 2017-10-15 NOTE — Progress Notes (Signed)
I called Dr. Posey Pronto to ask if all the meds that were held due to dialysis could be given since dialysis decided to have the pt come 10/16/17 and he stated that would be fine. Pt VS were WDL and she was sent home with her husband who had a oxygen tank in his car. Her IV was removed without incident.

## 2017-10-15 NOTE — Discharge Summary (Signed)
[place] at [facility], 61 y.o., DOB 12/18/1955, MRN [id number]. Admission date: 10/13/2017 Discharge Date 10/15/2017 Primary MD [name], MD Admitting Physician Harrie Foreman, MD  Admission Diagnosis  Hyperkalemia [E87.5] Acute pulmonary edema (HCC) [J81.0] Hypertensive urgency [I16.0] Acute respiratory failure with hypoxia (HCC) [J96.01] Stage 5 chronic kidney disease on chronic dialysis (HCC) [N18.6, Z99.2] Chronic bilateral pleural effusions [J90]  Discharge Diagnosis   Active Problems: Acute on chronic respiratory failure with hypoxia and hypercapnia due to acute diastolic CHF Acute on chronic diastolic CHF Hypokalemia End-stage renal disease Essential hypertension Diabetes type 2 Hyperlipidemia unspecified GERD COPD        Hospital Course  The patient with past medical history of end-stage renal disease on dialysis, COPD, CHF and hypertension presents to the emergency department due to shortness of breath.  Patient who has history of multiple admissions with noncompliance who states that she ate a bag of potato chips and started having this breathing trouble.  In the emergency room noted to have acute CHF.  She was admitted and had to be dialyzed.  Patient's breathing is significantly improved after dialysis.  She also had hypokalemia and had to receive treatment for that her potassium is now normal.  The patient chronically is on 4 L of oxygen her breathing is back to baseline.             Consults  nephrology  Significant Tests:  See full reports for all details     Dg Chest Port 1 View  Result Date: 10/15/2017 CLINICAL DATA:  Respiratory failure EXAM: PORTABLE CHEST 1 VIEW COMPARISON:  Two days ago FINDINGS: Dialysis catheter on the right with tip at the SVC. Chronic cardiomegaly. Improved interstitial opacity. There is still vascular congestion with fullness of the hila. No effusion or pneumothorax.  IMPRESSION: Improved pulmonary edema.  Stable cardiopericardial enlargement. Electronically Signed   By: Monte Fantasia M.D.   On: 10/15/2017 08:15   Dg Chest Port 1 View  Result Date: 10/13/2017 CLINICAL DATA:  Initial evaluation for acute respiratory distress. EXAM: PORTABLE CHEST 1 VIEW COMPARISON:  Prior radiograph from 12/05/2016. FINDINGS: Right-sided dual-lumen hemodialysis catheter in place, stable. Moderate cardiomegaly. Mediastinal silhouette normal. Aortic atherosclerosis. Lungs normally inflated. Diffuse vascular congestion with interstitial prominence, compatible with pulmonary edema. Small bilateral pleural effusions. No definite focal infiltrates. No pneumothorax. No acute osseous abnormality. IMPRESSION: 1. Cardiomegaly with moderate diffuse pulmonary edema and small bilateral pleural effusions, consistent with acute CHF exacerbation. 2. Aortic atherosclerosis. Electronically Signed   By: Jeannine Boga M.D.   On: 10/13/2017 04:03   Dg Chest Portable 1 View  Result Date: 10/05/2017 CLINICAL DATA:  Dyspnea EXAM: PORTABLE CHEST 1 VIEW COMPARISON:  08/11/2017 chest radiograph. FINDINGS: Right internal jugular central venous catheter terminates in the middle third of the superior vena cava. Stable cardiomediastinal silhouette with mild cardiomegaly and aortic atherosclerosis. No pneumothorax. Trace bilateral pleural effusions. Hazy and linear parahilar lung opacities in both lungs, compatible with mild-to-moderate pulmonary edema. IMPRESSION: Mild-to-moderate congestive heart failure with trace bilateral pleural effusions. Electronically Signed   By: Ilona Sorrel M.D.   On: 10/05/2017 19:53       Today   Subjective:   Annamary Rummage patient is breathing much improved currently back to baseline complains of some nasal congestion  Objective:   Blood pressure 130/60, pulse 74, temperature 98.6 F (37 C), temperature source Oral, resp. rate 19, height 5\' 7"  (1.702 m), weight 138  lb 12.8  oz (63 kg), SpO2 100 %.  .  Intake/Output Summary (Last 24 hours) at 10/15/2017 1408 Last data filed at 10/15/2017 0800 Gross per 24 hour  Intake 118 ml  Output 1287 ml  Net -1169 ml    Exam VITAL SIGNS: Blood pressure 130/60, pulse 74, temperature 98.6 F (37 C), temperature source Oral, resp. rate 19, height 5\' 7"  (1.702 m), weight 138 lb 12.8 oz (63 kg), SpO2 100 %.  GENERAL:  61 y.o.-year-old patient lying in the bed with no acute distress.  EYES: Pupils equal, round, reactive to light and accommodation. No scleral icterus. Extraocular muscles intact.  HEENT: Head atraumatic, normocephalic. Oropharynx and nasopharynx clear.  NECK:  Supple, no jugular venous distention. No thyroid enlargement, no tenderness.  LUNGS: Normal breath sounds bilaterally, no wheezing, rales,rhonchi or crepitation. No use of accessory muscles of respiration.  CARDIOVASCULAR: S1, S2 normal. No murmurs, rubs, or gallops.  ABDOMEN: Soft, nontender, nondistended. Bowel sounds present. No organomegaly or mass.  EXTREMITIES: No pedal edema, cyanosis, or clubbing.  Hand swelling NEUROLOGIC: Cranial nerves II through XII are intact. Muscle strength 5/5 in all extremities. Sensation intact. Gait not checked.  PSYCHIATRIC: The patient is alert and oriented x 3.  SKIN: No obvious rash, lesion, or ulcer.   Data Review     CBC w Diff:  Lab Results  Component Value Date   WBC 10.2 10/13/2017   HGB 10.7 (L) 10/13/2017   HGB 11.4 (L) 10/10/2014   HCT 33.6 (L) 10/13/2017   HCT 33.8 (L) 10/10/2014   PLT 295 10/13/2017   PLT 304 10/10/2014   LYMPHOPCT 18 10/13/2017   LYMPHOPCT 19.9 06/15/2014   MONOPCT 9 10/13/2017   MONOPCT 10.2 06/15/2014   EOSPCT 4 10/13/2017   EOSPCT 4.4 06/15/2014   BASOPCT 1 10/13/2017   BASOPCT 1.0 06/15/2014   CMP:  Lab Results  Component Value Date   NA 143 10/13/2017   NA 141 10/10/2014   K 3.8 10/13/2017   K 3.7 10/10/2014   CL 103 10/13/2017   CL 96 (L)  10/10/2014   CO2 27 10/13/2017   CO2 37 (H) 10/10/2014   BUN 65 (H) 10/13/2017   BUN 40 (H) 10/10/2014   CREATININE 7.09 (H) 10/13/2017   CREATININE 7.68 (H) 10/10/2014   PROT 7.5 10/13/2017   PROT 7.0 10/10/2014   ALBUMIN 3.9 10/13/2017   ALBUMIN 3.3 (L) 10/10/2014   BILITOT 0.7 10/13/2017   BILITOT 0.3 10/10/2014   ALKPHOS 136 (H) 10/13/2017   ALKPHOS 84 10/10/2014   AST 38 10/13/2017   AST 16 10/10/2014   ALT 28 10/13/2017   ALT 20 10/10/2014  .  Micro Results Recent Results (from the past 240 hour(s))  MRSA PCR Screening     Status: Abnormal   Collection Time: 10/06/17 12:00 AM  Result Value Ref Range Status   MRSA by PCR POSITIVE (A) NEGATIVE Final    Comment:        The GeneXpert MRSA Assay (FDA approved for NASAL specimens only), is one component of a comprehensive MRSA colonization surveillance program. It is not intended to diagnose MRSA infection nor to guide or monitor treatment for MRSA infections. RESULT CALLED TO, READ BACK BY AND VERIFIED WITH: ERICA TAYLOR AT 0204 10/06/17.PMH         Code Status Orders  (From admission, onward)        Start     Ordered   10/13/17 0801  Full code  Continuous     10/13/17  0800    Code Status History    Date Active Date Inactive Code Status Order ID Comments User Context   10/05/2017 23:59 10/07/2017 17:45 Full Code [id number]  Gorden Harms, MD Inpatient   08/11/2017 17:56 08/13/2017 16:15 Full Code [id number]  Vaughan Basta, MD Inpatient   06/15/2017 19:42 06/20/2017 16:52 Full Code [id number]  Idelle Crouch, MD Inpatient   05/21/2017 17:58 05/22/2017 17:24 Full Code [id number]  Nicholes Mango, MD Inpatient   12/04/2016 09:55 12/11/2016 17:41 Full Code [id number]  Fritzi Mandes, MD Inpatient   10/29/2016 12:14 10/30/2016 19:43 Full Code [id number]  Laverle Hobby, MD ED   10/06/2016 20:33 10/11/2016 15:54 Full Code [id number]  Mikael Spray, NP ED   09/14/2016 09:19 09/16/2016 16:07 Full Code  [id number]  Laverle Hobby, MD ED   07/23/2016 11:05 07/24/2016 22:49 Full Code [id number]  Wilhelmina Mcardle, MD ED   07/09/2016 18:43 07/13/2016 17:23 Full Code [id number]  Lytle Butte, MD ED   05/28/2016 14:54 06/02/2016 14:51 Full Code [id number]  Theodoro Grist, MD Inpatient   04/25/2016 18:04 04/26/2016 21:17 Full Code [id number]  Nicholes Mango, MD Inpatient   10/28/2015 18:50 10/30/2015 18:09 Full Code [id number]  Theodoro Grist, MD Inpatient   03/30/2015 00:51 03/31/2015 16:44 Full Code [id number]  Juluis Mire, MD Inpatient            Discharge Medications   Allergies as of 10/15/2017      Reactions   Sulfa Antibiotics Itching, Swelling, Rash, Other (See Comments)   Reaction:  Facial/body swelling       Medication List    TAKE these medications   albuterol 108 (90 Base) MCG/ACT inhaler Commonly known as:  PROVENTIL HFA;VENTOLIN HFA Inhale 2 puffs every 6 (six) hours as needed into the lungs for wheezing or shortness of breath.   ALPRAZolam 0.5 MG tablet Commonly known as:  XANAX Take 0.5 mg by mouth 2 (two) times daily.   amLODipine 10 MG tablet Commonly known as:  NORVASC Take 10 mg by mouth daily.   calcium acetate 667 MG capsule Commonly known as:  PHOSLO Take 3 capsules (2,001 mg total) by mouth 3 (three) times daily with meals.   carvedilol 12.5 MG tablet Commonly known as:  COREG Take 12.5 mg by mouth 2 (two) times daily with a meal.   cinacalcet 30 MG tablet Commonly known as:  SENSIPAR Take 60 mg by mouth daily.   ezetimibe 10 MG tablet Commonly known as:  ZETIA Take 10 mg by mouth daily.   feeding supplement (PRO-STAT SUGAR FREE 64) Liqd Take 30 mLs by mouth daily.   fluticasone 50 MCG/ACT nasal spray Commonly known as:  FLONASE Place 2 sprays into both nostrils daily as needed for rhinitis.   furosemide 40 MG tablet Commonly known as:  LASIX Take 40 mg by mouth daily.   hydrALAZINE 50 MG tablet Commonly known as:  APRESOLINE Take 2 tablets  (100 mg total) by mouth 3 (three) times daily. What changed:  how much to take   hydrochlorothiazide 25 MG tablet Commonly known as:  HYDRODIURIL Take 1 tablet (25 mg total) by mouth daily.   ipratropium-albuterol 0.5-2.5 (3) MG/3ML Soln Commonly known as:  DUONEB Take 3 mLs by nebulization every 6 (six) hours as needed.   lidocaine-prilocaine cream Commonly known as:  EMLA Apply 1 application topically as needed (prior to accessing port).   losartan 100 MG tablet Commonly known as:  COZAAR Take 100 mg by mouth daily.   montelukast  10 MG tablet Commonly known as:  SINGULAIR Take 1 tablet (10 mg total) by mouth at bedtime.   multivitamin Tabs tablet Take 1 tablet by mouth at bedtime.   ondansetron 4 MG tablet Commonly known as:  ZOFRAN Take 1 tablet (4 mg total) by mouth every 8 (eight) hours as needed for nausea or vomiting.   pantoprazole 40 MG tablet Commonly known as:  PROTONIX Take 40 mg by mouth 2 (two) times daily.   sennosides-docusate sodium 8.6-50 MG tablet Commonly known as:  SENOKOT-S Take 1 tablet by mouth at bedtime as needed for constipation.   sertraline 50 MG tablet Commonly known as:  ZOLOFT Take 50 mg by mouth daily.   torsemide 100 MG tablet Commonly known as:  DEMADEX Take 1 tablet (100 mg total) by mouth daily.   traMADol 50 MG tablet Commonly known as:  ULTRAM Take 1 tablet (50 mg total) by mouth every 12 (twelve) hours as needed for severe pain.            Durable Medical Equipment  (From admission, onward)        Start     Ordered   10/15/17 1048  For home use only DME Cane  Once     10/15/17 1047         Total Time in preparing paper work, data evaluation and todays exam - 45 minutes  Dustin Flock M.D on 10/15/2017 at 2:08 [facility]  [facility]   Office  [phone]

## 2017-10-15 NOTE — Progress Notes (Signed)
[facility], [place] 10/15/17  Subjective:  Patient due for another dialysis treatment today. Currently off schedule secondary to the Thanksgiving holiday.  Objective:  Vital signs in last 24 hours:  Temp:  [98.3 F (36.8 C)-99.1 F (37.3 C)] 98.6 F (37 C) (11/20 0736) Pulse Rate:  [62-95] 74 (11/20 0851) Resp:  [13-22] 19 (11/20 0736) BP: (117-213)/(56-113) 130/60 (11/20 0921) SpO2:  [91 %-100 %] 100 % (11/20 0851) Weight:  [61.3 kg (135 lb 2.3 oz)-67 kg (147 lb 11.3 oz)] 63 kg (138 lb 12.8 oz) (11/20 0346)  Weight change: -2.6 kg (-11.7 oz) Filed Weights   10/14/17 1342 10/14/17 1718 10/15/17 0346  Weight: 61.3 kg (135 lb 2.3 oz) 67 kg (147 lb 11.3 oz) 63 kg (138 lb 12.8 oz)    Intake/Output:    Intake/Output Summary (Last 24 hours) at 10/15/2017 1140 Last data filed at 10/15/2017 0800 Gross per 24 hour  Intake 118 ml  Output 1287 ml  Net -1169 ml     Physical Exam: General:  Chronically ill-appearing  HEENT  facial swelling  Neck  supple  Pulm/lungs  minimal rales, normal effort  CVS/Heart  S1S2 no rubs  Abdomen:   Soft, nontender  Extremities:  trace LE edema  Neurologic:  Alert, oriented, follows commands  Skin:  No acute rashes  Access:  PermCath and aneurysmal left upper extremity AV fistula       Basic Metabolic Panel:  Recent Labs  Lab 10/13/17 0324 10/13/17 1655  NA 143  --   K 7.2* 3.8  CL 103  --   CO2 27  --   GLUCOSE 144*  --   BUN 65*  --   CREATININE 7.09*  --   CALCIUM 7.6*  --      CBC: Recent Labs  Lab 10/13/17 0324  WBC 10.2  NEUTROABS 7.0*  HGB 10.7*  HCT 33.6*  MCV 103.7*  PLT 295      Lab Results  Component Value Date   HEPBSAG Negative 10/06/2017   HEPBSAB Reactive 10/06/2017      Microbiology:  Recent Results (from the past 240 hour(s))  MRSA PCR Screening     Status: Abnormal   Collection Time: 10/06/17 12:00 AM  Result Value Ref Range Status   MRSA by PCR POSITIVE (A)  NEGATIVE Final    Comment:        The GeneXpert MRSA Assay (FDA approved for NASAL specimens only), is one component of a comprehensive MRSA colonization surveillance program. It is not intended to diagnose MRSA infection nor to guide or monitor treatment for MRSA infections. RESULT CALLED TO, READ BACK BY AND VERIFIED WITH: ERICA TAYLOR AT 0204 10/06/17.PMH     Coagulation Studies: No results for input(s): LABPROT, INR in the last 72 hours.  Urinalysis: No results for input(s): COLORURINE, LABSPEC, PHURINE, GLUCOSEU, HGBUR, BILIRUBINUR, KETONESUR, PROTEINUR, UROBILINOGEN, NITRITE, LEUKOCYTESUR in the last 72 hours.  Invalid input(s): APPERANCEUR    Imaging: Dg Chest Port 1 View  Result Date: 10/15/2017 CLINICAL DATA:  Respiratory failure EXAM: PORTABLE CHEST 1 VIEW COMPARISON:  Two days ago FINDINGS: Dialysis catheter on the right with tip at the SVC. Chronic cardiomegaly. Improved interstitial opacity. There is still vascular congestion with fullness of the hila. No effusion or pneumothorax. IMPRESSION: Improved pulmonary edema.  Stable cardiopericardial enlargement. Electronically Signed   By: Monte Fantasia M.D.   On: 10/15/2017 08:15     Medications:    . acetic acid-hydrocortisone  4 drop  Both EARS Q6H  . amLODipine  10 mg Oral Daily  . calcium acetate  2,001 mg Oral TID WC  . carvedilol  12.5 mg Oral BID WC  . Chlorhexidine Gluconate Cloth  6 each Topical Q0600  . cinacalcet  60 mg Oral Daily  . cloNIDine  0.2 mg Oral BID  . docusate sodium  100 mg Oral BID  . epoetin (EPOGEN/PROCRIT) injection  4,000 Units Intravenous Q M,W,F-HD  . ezetimibe  10 mg Oral Daily  . feeding supplement (PRO-STAT SUGAR FREE 64)  30 mL Oral Daily  . heparin  5,000 Units Subcutaneous Q8H  . hydrALAZINE  100 mg Oral TID  . losartan  100 mg Oral Daily  . mouth rinse  15 mL Mouth Rinse BID  . montelukast  10 mg Oral QHS  . multivitamin  1 tablet Oral QHS  . mupirocin ointment  1  application Nasal BID  . pantoprazole  40 mg Oral Daily  . predniSONE  50 mg Oral Once  . sertraline  50 mg Oral Daily  . torsemide  100 mg Oral Daily   acetaminophen **OR** acetaminophen, ALPRAZolam, fluticasone, hydrALAZINE, ipratropium-albuterol, lidocaine-prilocaine, ondansetron **OR** ondansetron (ZOFRAN) IV, senna-docusate, traMADol  Assessment/ Plan:  61 y.o. African-American female with end stage renal disease on hemodialysis, hypertension, congestive heart failure, COPD/tobacco abuse, diabetes mellitus type II, depression, obstructive sleep apnea, peripheral vascular disease,  Patient presents back to the hospital within a week for similar complaints of hyperkalemia, pulmonary edema, respiratory failure requiring BiPAP.  Patient denies missing any dialysis treatments.  MWF [address]  180 min/ 59.5 kg  1.  End-stage renal disease with hyperkalemia, acute pulmonary edema, hypercarbic respiratory failure requiring noninvasive positive pressure ventilation upon admission. -The patient's outpatient dialysis schedule has been changed given the Thanksgiving holiday that is upcoming this week.  Therefore we will plan for dialysis again today.  She will resume her normal schedule on Friday.  2.  Anemia of chronic kidney disease Hemoglobin 10.7 at 10.7 at target.  3.  Secondary hyperparathyroidism Recheck serum phosphorus today.  Continue calcium acetate as well as Sensipar.     LOS: 2 Michele Nash 11/20/201811:40 AM  [facility] [place], [place]

## 2017-10-15 NOTE — Evaluation (Signed)
Physical Therapy Evaluation Patient Details Name: Michele Nash MRN: [id number] DOB: 05/14/1956 Today's Date: 10/15/2017   History of Present Illness  61 y.o. female here with severe SOB.  Admitted with acute respiratory failure, acute on chronic diastolic CHF.  PMH includes ESRD (MWF), COPD, htn, CHF, DM.  Clinical Impression  Pt able to easily get to EOB, standing and ultimately was able to walk and negotiate steps w/o issue.  She reports that on the 3 days she has dialysis she is generally quite tired and has a harder time negotiating steps (needs to rest on landing half way up her 14 total steps).  She showed good overall safety and confidence w/o AD and on 3 liters her sats stayed in the mid 90s during session. Pt at/near baseline and does not require further PT intervention. Safe to return home from a PT perspective.    Follow Up Recommendations No PT follow up    Equipment Recommendations  (pt discussed getting cane secondary to post-HD fatigue)    Recommendations for Other Services       Precautions / Restrictions Precautions Precautions: None Restrictions Weight Bearing Restrictions: No      Mobility  Bed Mobility Overal bed mobility: Independent                Transfers Overall transfer level: Independent Equipment used: None             General transfer comment: Pt easily and confidently gets to standing at EOB w/o AD and w/o assist  Ambulation/Gait Ambulation/Gait assistance: Independent Ambulation Distance (Feet): 60 Feet Assistive device: None       General Gait Details: 3 liters O2 via [place], sats stay in the 90s.  Pt able to confidently ambulate w/o AD, w/o LOBs, w/o assist.  She showed good effort/confidence/safety.  She did have some minimal fatigue with the effort.   Stairs Stairs: Yes Stairs assistance: Modified independent (Device/Increase time) Stair Management: One rail Right Number of Stairs: 20 General stair comments: Pt able to  negotiate up/down steps w/o assist, 3 liters O2 donned.  Pt with some fatigue after the effort, but sats remain in the 90s.   Wheelchair Mobility    Modified Rankin (Stroke Patients Only)       Balance Overall balance assessment: Independent                                           Pertinent Vitals/Pain Pain Assessment: No/denies pain    Home Living Family/patient expects to be discharged to:: Private residence Living Arrangements: Spouse/significant other;Other relatives Available Help at Discharge: Family;Available PRN/intermittently Type of Home: Apartment Home Access: Stairs to enter Entrance Stairs-Rails: Chemical engineer of Steps: flight          Prior Function Level of Independence: Independent         Comments: Uses 3 L home O2 at rest but increases to 4 L with activity.  Pt denies any falls in past 6 months.      Hand Dominance        Extremity/Trunk Assessment   Upper Extremity Assessment Upper Extremity Assessment: Overall WFL for tasks assessed    Lower Extremity Assessment Lower Extremity Assessment: Overall WFL for tasks assessed       Communication   Communication: No difficulties  Cognition Arousal/Alertness: Awake/alert Behavior During Therapy: WFL for tasks assessed/performed Overall Cognitive Status: Within  Functional Limits for tasks assessed                                        General Comments      Exercises     Assessment/Plan    PT Assessment Patent does not need any further PT services  PT Problem List         PT Treatment Interventions      PT Goals (Current goals can be found in the Care Plan section)  Acute Rehab PT Goals Patient Stated Goal: go home PT Goal Formulation: All assessment and education complete, DC therapy    Frequency     Barriers to discharge        Co-evaluation               AM-PAC PT "6 Clicks" Daily Activity  Outcome  Measure Difficulty turning over in bed (including adjusting bedclothes, sheets and blankets)?: None Difficulty moving from lying on back to sitting on the side of the bed? : None Difficulty sitting down on and standing up from a chair with arms (e.g., wheelchair, bedside commode, etc,.)?: None Help needed moving to and from a bed to chair (including a wheelchair)?: None Help needed walking in hospital room?: None Help needed climbing 3-5 steps with a railing? : None 6 Click Score: 24    End of Session Equipment Utilized During Treatment: Gait belt;Oxygen(3 liters) Activity Tolerance: Patient tolerated treatment well Patient left: in bed;with call bell/phone within reach Nurse Communication: Mobility status PT Visit Diagnosis: Muscle weakness (generalized) (M62.81)    Time: 2800-3491 PT Time Calculation (min) (ACUTE ONLY): 24 min   Charges:   PT Evaluation $PT Eval Low Complexity: 1 Low     PT G Codes:   PT G-Codes **NOT FOR INPATIENT CLASS** Functional Assessment Tool Used: AM-PAC 6 Clicks Basic Mobility Functional Limitation: Mobility: Walking and moving around Mobility: Walking and Moving Around Current Status (P9150): 0 percent impaired, limited or restricted Mobility: Walking and Moving Around Goal Status (V6979): 0 percent impaired, limited or restricted Mobility: Walking and Moving Around Discharge Status (Y8016): 0 percent impaired, limited or restricted    Kreg Shropshire, DPT 10/15/2017, 1:57 PM

## 2017-10-15 NOTE — Care Management (Addendum)
Michele Nash with [facility] notified of patient discharge to home today and needing HD. Per Michele Nash- outpatient HD chair arranged for tomorrow. Patient declined home health arrangements.

## 2017-10-15 NOTE — Discharge Instructions (Signed)
[facility] at [place]:  Renal diet  DISCHARGE CONDITION:  Stable  ACTIVITY:  Activity as tolerated  OXYGEN:  Home Oxygen: Yes.     Oxygen Delivery: 4 liters/min via Patient connected to nasal cannula oxygen  DISCHARGE LOCATION:  home    ADDITIONAL DISCHARGE INSTRUCTION:   If you experience worsening of your admission symptoms, develop shortness of breath, life threatening emergency, suicidal or homicidal thoughts you must seek medical attention immediately by calling 911 or calling your MD immediately  if symptoms less severe.  You Must read complete instructions/literature along with all the possible adverse reactions/side effects for all the Medicines you take and that have been prescribed to you. Take any new Medicines after you have completely understood and accpet all the possible adverse reactions/side effects.   Please note  You were cared for by a hospitalist during your hospital stay. If you have any questions about your discharge medications or the care you received while you were in the hospital after you are discharged, you can call the unit and asked to speak with the hospitalist on call if the hospitalist that took care of you is not available. Once you are discharged, your primary care physician will handle any further medical issues. Please note that NO REFILLS for any discharge medications will be authorized once you are discharged, as it is imperative that you return to your primary care physician (or establish a relationship with a primary care physician if you do not have one) for your aftercare needs so that they can reassess your need for medications and monitor your lab values.

## 2017-10-15 NOTE — Progress Notes (Signed)
Called Dr Posey Pronto with a BP 182/88 P 71 he stated to give pt clonidine and hydralazine. Called pharmacy to ensure it was okay to give and they okay

## 2017-10-22 ENCOUNTER — Encounter: Payer: Self-pay | Admitting: Family

## 2017-10-22 ENCOUNTER — Other Ambulatory Visit: Payer: Self-pay

## 2017-10-22 ENCOUNTER — Ambulatory Visit: Payer: Medicare Other | Attending: Family | Admitting: Family

## 2017-10-22 VITALS — BP 157/87 | HR 69 | Resp 18 | Ht 65.0 in | Wt 134.1 lb

## 2017-10-22 DIAGNOSIS — Z8249 Family history of ischemic heart disease and other diseases of the circulatory system: Secondary | ICD-10-CM | POA: Insufficient documentation

## 2017-10-22 DIAGNOSIS — Z90710 Acquired absence of both cervix and uterus: Secondary | ICD-10-CM | POA: Diagnosis not present

## 2017-10-22 DIAGNOSIS — Z87891 Personal history of nicotine dependence: Secondary | ICD-10-CM | POA: Insufficient documentation

## 2017-10-22 DIAGNOSIS — Z803 Family history of malignant neoplasm of breast: Secondary | ICD-10-CM | POA: Diagnosis not present

## 2017-10-22 DIAGNOSIS — I10 Essential (primary) hypertension: Secondary | ICD-10-CM

## 2017-10-22 DIAGNOSIS — I509 Heart failure, unspecified: Secondary | ICD-10-CM | POA: Diagnosis not present

## 2017-10-22 DIAGNOSIS — I5032 Chronic diastolic (congestive) heart failure: Secondary | ICD-10-CM

## 2017-10-22 DIAGNOSIS — Z992 Dependence on renal dialysis: Secondary | ICD-10-CM | POA: Insufficient documentation

## 2017-10-22 DIAGNOSIS — Z882 Allergy status to sulfonamides status: Secondary | ICD-10-CM | POA: Insufficient documentation

## 2017-10-22 DIAGNOSIS — E1122 Type 2 diabetes mellitus with diabetic chronic kidney disease: Secondary | ICD-10-CM | POA: Insufficient documentation

## 2017-10-22 DIAGNOSIS — Z79899 Other long term (current) drug therapy: Secondary | ICD-10-CM | POA: Diagnosis not present

## 2017-10-22 DIAGNOSIS — I132 Hypertensive heart and chronic kidney disease with heart failure and with stage 5 chronic kidney disease, or end stage renal disease: Secondary | ICD-10-CM | POA: Diagnosis not present

## 2017-10-22 DIAGNOSIS — N186 End stage renal disease: Secondary | ICD-10-CM | POA: Diagnosis not present

## 2017-10-22 DIAGNOSIS — Z79891 Long term (current) use of opiate analgesic: Secondary | ICD-10-CM | POA: Diagnosis not present

## 2017-10-22 NOTE — Patient Instructions (Signed)
Continue weighing daily and call for an overnight weight gain of > 2 pounds or a weekly weight gain of >5 pounds. 

## 2017-10-22 NOTE — Progress Notes (Signed)
Patient ID: Michele Nash, female    DOB: 1956-03-16, 62 y.o.   MRN: [id number]  HPI  Michele Nash is a 61 y/o female with a history of DM, hyperlipidemia, HTN, CKD on dialysis, COPD, depression, GERD, anemia, obstructive sleep apnea, GIB, recent tobacco use and chronic heart failure.   Reviewed echo report from 04/25/16 which showed an EF of 70% along with trivial MR/TR.   Admitted 10/13/17 due to shortness of breath due to acute HF. Ate a bag of potato chips prior to admission. Was dialyzed and discharged home after 2 days. Admitted 10/05/17 due to acute on chronic respiratory failure related to HF/ COPD/ ESRD on dialysis. Urgent hemodialysis was done with discharge after 2 days. Was in the ED 09/11/17 due to pain/swelling in the right leg/foot. She was treated and released. Admitted 08/11/17 due to pulmonary edema from underlying renal disease. Initially needed bipap and then weaned to her baseline 4L of oxygen. Dialysis for ESRD. Discharged home after 2 days. Was in the ED 08/04/17 due to left leg pain. Evaluated and released home. Admitted 06/15/17 due to uncontrolled HTN and HF. Medications were adjusted. Discharged after 5 days with antibiotics. Was in the ED 06/05/17 due to chest pain. Was treated and released. Admitted 05/21/17 due to hyperkalemia. Treated with kayexalate and hemodialysis and was discharged the next day.   She presents today for her follow-up visit with a chief complaint of minimal shortness of breath upon moderate exertion. She describes this as chronic in nature having been present for several years with varying levels of severity. She has associated fatigue, difficulty sleeping and fluctuating weight. She denies any chest pain, edema, palpitations or dizziness.   Past Medical History:  Diagnosis Date  . Altered mental status   . Anemia   . Apnea, sleep    for sleep study 10/17/15-no cpap yet  . Arthritis   . Bronchitis   . CHF (congestive heart failure) ([place])   . Chronic  kidney disease   . COPD (chronic obstructive pulmonary disease) ([place])   . Depression   . Dialysis patient ([place]) 2014  . GERD (gastroesophageal reflux disease)   . GIB (gastrointestinal bleeding) 07/09/2016  . Headache   . Hyperlipidemia   . Hypertension   . Obesity   . Pulmonary edema 09/14/2016  . Renal dialysis device, implant, or graft complication    RIGHT CHEST CATH  . Respiratory failure ([place]) 07/23/2016  . Traumatic hematoma of forehead   . Type II diabetes mellitus ([place]) 09/21/2009   diet controlled   Past Surgical History:  Procedure Laterality Date  . ABDOMINAL HYSTERECTOMY    . COLONOSCOPY  2011  . COLONOSCOPY WITH PROPOFOL N/A 09/20/2015   Procedure: COLONOSCOPY WITH PROPOFOL;  Surgeon: Lucilla Lame, MD;  Location: [facility] ENDOSCOPY;  Service: Endoscopy;  Laterality: N/A;  . DIALYSIS FISTULA CREATION    . ESOPHAGOGASTRODUODENOSCOPY N/A 07/13/2016   Procedure: ESOPHAGOGASTRODUODENOSCOPY (EGD);  Surgeon: Lollie Sails, MD;  Location: [facility] ENDOSCOPY;  Service: Endoscopy;  Laterality: N/A;  . ESOPHAGOGASTRODUODENOSCOPY (EGD) WITH PROPOFOL N/A 06/02/2016   Procedure: ESOPHAGOGASTRODUODENOSCOPY (EGD) WITH PROPOFOL;  Surgeon: Manya Silvas, MD;  Location: [facility] ENDOSCOPY;  Service: Endoscopy;  Laterality: N/A;  . LIGATION OF ARTERIOVENOUS  FISTULA Left 05/24/2017   Procedure: LIGATION OF ARTERIOVENOUS  FISTULA ( PERMCATH INSERTION );  Surgeon: Katha Cabal, MD;  Location: [facility] ORS;  Service: Vascular;  Laterality: Left;  . PERIPHERAL VASCULAR CATHETERIZATION N/A 05/10/2015   Procedure: A/V Shuntogram/Fistulagram;  Surgeon: Belenda Cruise  Eloise Levels, MD;  Location: [place] [facility];  Service: Cardiovascular;  Laterality: N/A;  . PERIPHERAL VASCULAR CATHETERIZATION Left 05/10/2015   Procedure: A/V Shunt Intervention;  Surgeon: Katha Cabal, MD;  Location: [place] [facility];  Service: Cardiovascular;  Laterality: Left;  . PERIPHERAL VASCULAR CATHETERIZATION Left 08/23/2015    Procedure: A/V Shuntogram/Fistulagram;  Surgeon: Katha Cabal, MD;  Location: [place] [facility];  Service: Cardiovascular;  Laterality: Left;  . PERIPHERAL VASCULAR CATHETERIZATION N/A 08/23/2015   Procedure: A/V Shunt Intervention;  Surgeon: Katha Cabal, MD;  Location: [place] [facility];  Service: Cardiovascular;  Laterality: N/A;  . PERIPHERAL VASCULAR CATHETERIZATION  08/23/2015   Procedure: Dialysis/Perma Catheter Insertion;  Surgeon: Katha Cabal, MD;  Location: [place] [facility];  Service: Cardiovascular;;  . PERIPHERAL VASCULAR CATHETERIZATION N/A 01/03/2016   Procedure: Dialysis/Perma Catheter Removal;  Surgeon: Katha Cabal, MD;  Location: [place] [facility];  Service: Cardiovascular;  Laterality: N/A;  . REVISON OF ARTERIOVENOUS FISTULA Left 10/28/2015   Procedure: REVISON OF ARTERIOVENOUS FISTULA WITH ARTEGRAFT;  Surgeon: Katha Cabal, MD;  Location: [facility] ORS;  Service: Vascular;  Laterality: Left;  . WOUND DEBRIDEMENT Left 10/28/2015   Procedure: Resection of shoulder cyst ( left );  Surgeon: Katha Cabal, MD;  Location: [facility] ORS;  Service: Vascular;  Laterality: Left;   Family History  Problem Relation Age of Onset  . Heart disease Mother   . Cancer Father   . Cancer Sister   . Breast cancer Sister    Social History   Tobacco Use  . Smoking status: Former Smoker    Years: 40.00    Types: Cigarettes    Last attempt to quit: 07/28/2017    Years since quitting: 0.2  . Smokeless tobacco: Never Used  Substance Use Topics  . Alcohol use: No   Allergies  Allergen Reactions  . Sulfa Antibiotics Itching, Swelling, Rash and Other (See Comments)    Reaction:  Facial/body swelling    Prior to Admission medications   Medication Sig Start Date End Date Taking? Authorizing Provider  albuterol (PROVENTIL HFA;VENTOLIN HFA) 108 (90 Base) MCG/ACT inhaler Inhale 2 puffs every 6 (six) hours as needed into the lungs for wheezing or shortness of breath.  10/07/17  Yes Vaughan Basta, MD  ALPRAZolam Duanne Moron) 0.5 MG tablet Take 0.5 mg by mouth 2 (two) times daily.   Yes [provider]  Amino Acids-Protein Hydrolys (FEEDING SUPPLEMENT, PRO-STAT SUGAR FREE 64,) LIQD Take 30 mLs by mouth daily. 12/12/16  Yes Gouru, Illene Silver, MD  amLODipine (NORVASC) 10 MG tablet Take 10 mg by mouth daily.    Yes [provider]  calcium acetate (PHOSLO) 667 MG capsule Take 3 capsules (2,001 mg total) by mouth 3 (three) times daily with meals. 06/20/17  Yes Wieting, Richard, MD  carvedilol (COREG) 12.5 MG tablet Take 12.5 mg by mouth 2 (two) times daily with a meal.   Yes [provider]  cinacalcet (SENSIPAR) 30 MG tablet Take 60 mg by mouth daily.    Yes [provider]  ezetimibe (ZETIA) 10 MG tablet Take 10 mg by mouth daily.    Yes [provider]  fluticasone (FLONASE) 50 MCG/ACT nasal spray Place 2 sprays into both nostrils daily as needed for rhinitis.   Yes [provider]  furosemide (LASIX) 40 MG tablet Take 40 mg by mouth daily.    Yes [provider]  hydrALAZINE (APRESOLINE) 50 MG tablet Take 2 tablets (100  mg total) by mouth 3 (three) times daily. 10/15/17  Yes Dustin Flock, MD  hydrochlorothiazide (HYDRODIURIL) 25 MG tablet Take 1 tablet (25 mg total) by mouth daily. 09/11/17  Yes Menshew, Dannielle Karvonen, PA-C  ipratropium-albuterol (DUONEB) 0.5-2.5 (3) MG/3ML SOLN Take 3 mLs by nebulization every 6 (six) hours as needed. 12/11/16  Yes Gouru, Illene Silver, MD  lidocaine-prilocaine (EMLA) cream Apply 1 application topically as needed (prior to accessing port).   Yes [provider]  losartan (COZAAR) 100 MG tablet Take 100 mg by mouth daily.    Yes [provider]  montelukast (SINGULAIR) 10 MG tablet Take 1 tablet (10 mg total) by mouth at bedtime. 10/15/17  Yes Dustin Flock, MD  multivitamin (RENA-VIT) TABS tablet Take 1 tablet by mouth at bedtime.    Yes [provider]  ondansetron (ZOFRAN) 4 MG tablet Take 1 tablet (4 mg total) by mouth every 8 (eight) hours as needed for nausea or vomiting. 03/24/17  Yes Lisa Roca, MD  pantoprazole (PROTONIX) 40 MG tablet Take 40 mg by mouth 2 (two) times daily.   Yes [provider]  sennosides-docusate sodium (SENOKOT-S) 8.6-50 MG tablet Take 1 tablet by mouth at bedtime as needed for constipation.   Yes [provider]  sertraline (ZOLOFT) 50 MG tablet Take 50 mg by mouth daily.    Yes [provider]  torsemide (DEMADEX) 100 MG tablet Take 1 tablet (100 mg total) by mouth daily. 10/15/17  Yes Dustin Flock, MD  traMADol (ULTRAM) 50 MG tablet Take 1 tablet (50 mg total) by mouth every 12 (twelve) hours as needed for severe pain. 08/04/17 08/04/18 Yes Arta Silence, MD   Review of Systems  Constitutional: Positive for fatigue. Negative for appetite change.  HENT: Positive for congestion and postnasal drip. Negative for ear pain and sore throat.   Eyes: Negative.   Respiratory: Positive for shortness of breath (at times). Negative for cough, chest tightness and wheezing.   Cardiovascular: Negative for chest pain, palpitations and leg swelling.  Gastrointestinal: Negative for abdominal distention and abdominal pain.  Endocrine: Negative.   Genitourinary: Negative.   Musculoskeletal: Positive for back pain. Negative for neck pain.  Skin: Negative.   Allergic/Immunologic: Negative.   Neurological: Negative for dizziness and light-headedness.  Hematological: Negative for adenopathy. Does not bruise/bleed easily.  Psychiatric/Behavioral: Positive for sleep disturbance (waking up frequently; wearing CPAP and oxygen). Negative for dysphoric mood. The patient is not nervous/anxious.    Vitals:   10/22/17 1326  BP: (!) 157/87  Pulse: 69  Resp: 18  SpO2: 100%  Weight: 134 lb 2 oz (60.8 kg)  Height: 5\' 5"  (1.651 m)   Wt Readings from Last 3 Encounters:  10/22/17 134 lb 2 oz  (60.8 kg)  10/15/17 138 lb 12.8 oz (63 kg)  10/07/17 135 lb 9.3 oz (61.5 kg)    Lab Results  Component Value Date   CREATININE 7.09 (H) 10/13/2017   CREATININE 4.33 (H) 10/06/2017   CREATININE 6.76 (H) 10/05/2017   Physical Exam  Constitutional: She is oriented to person, place, and time. She appears well-developed and well-nourished.  HENT:  Head: Normocephalic and atraumatic.  Neck: Normal range of motion. Neck supple. No JVD present.  Cardiovascular: Normal rate and regular rhythm.  Pulmonary/Chest: Effort normal. She has no wheezes. She has no rales.  Abdominal: Soft. She exhibits no distension. There is no tenderness.  Musculoskeletal: She exhibits no edema or tenderness.  Neurological: She is alert and oriented to person, place,  and time.  Skin: Skin is warm and dry.  Psychiatric: She has a normal mood and affect. Her behavior is normal. Thought content normal.  Nursing note and vitals reviewed.  Assessment & Plan:  1:Chronic heart failure with preserved ejection fraction- - NYHA class II - euvolemic - weighing daily. Reminded to call for an overnight weight gain of >2 pounds or a weekly weight gain of >5 pounds - weight up 5 pounds since she was last here - does add salt to her foods at times; discussed the importance of closely following a 2000mg  sodium diet  - continues to eat potato chips at times knowing it has a lot of sodium in them. Encouraged her to read serving size closely and to limit the amount of chips that she's eating - trying to remain active and does allow for frequent rest periods - BMP from 10/13/17 reviewed and showed sodium 143, potassium 7.2 and GFR 6.  - patient reports receiving her flu vaccine for this season  - wearing oxygen at 4L around the clock  2: HTN- - BP mildly elevated today & she has to take some of her medications when she returns home - follows with PCP Rosario Jacks) about her BP  3: ESRD on dialysis- - receives dialysis on M, W,  F - has left upper arm fistula that is enlarged and patient says is tender when it's accessed; + thrill present - she is using a port-a-cath for her dialysis until they can figure out what is wrong with the fistula  Patient did not bring her medications nor a list. Each medication was verbally reviewed with the patient and she was encouraged to bring the bottles to every visit to confirm accuracy of list.  Return in 6 months or sooner for any questions/problems before then.

## 2017-11-07 ENCOUNTER — Encounter: Payer: Self-pay | Admitting: Emergency Medicine

## 2017-11-07 ENCOUNTER — Emergency Department
Admission: EM | Admit: 2017-11-07 | Discharge: 2017-11-07 | Disposition: A | Discharge status: Home / Self Care | Payer: Medicare Other | Attending: Emergency Medicine | Admitting: Emergency Medicine

## 2017-11-07 DIAGNOSIS — R042 Hemoptysis: Secondary | ICD-10-CM | POA: Diagnosis not present

## 2017-11-07 DIAGNOSIS — Z5321 Procedure and treatment not carried out due to patient leaving prior to being seen by health care provider: Secondary | ICD-10-CM | POA: Diagnosis not present

## 2017-11-07 LAB — CBC
HCT: 29.9 % — ABNORMAL LOW (ref 35.0–47.0)
HEMOGLOBIN: 9.7 g/dL — AB (ref 12.0–16.0)
MCH: 32.9 pg (ref 26.0–34.0)
MCHC: 32.3 g/dL (ref 32.0–36.0)
MCV: 101.8 fL — ABNORMAL HIGH (ref 80.0–100.0)
PLATELETS: 299 10*3/uL (ref 150–440)
RBC: 2.94 MIL/uL — ABNORMAL LOW (ref 3.80–5.20)
RDW: 15.5 % — AB (ref 11.5–14.5)
WBC: 7.7 10*3/uL (ref 3.6–11.0)

## 2017-11-07 LAB — COMPREHENSIVE METABOLIC PANEL
ALBUMIN: 3.7 g/dL (ref 3.5–5.0)
ALK PHOS: 87 U/L (ref 38–126)
ALT: 16 U/L (ref 14–54)
ANION GAP: 11 (ref 5–15)
AST: 32 U/L (ref 15–41)
BILIRUBIN TOTAL: 1 mg/dL (ref 0.3–1.2)
BUN: 48 mg/dL — AB (ref 6–20)
CALCIUM: 8.2 mg/dL — AB (ref 8.9–10.3)
CO2: 27 mmol/L (ref 22–32)
Chloride: 100 mmol/L — ABNORMAL LOW (ref 101–111)
Creatinine, Ser: 8.1 mg/dL — ABNORMAL HIGH (ref 0.44–1.00)
GFR calc Af Amer: 5 mL/min — ABNORMAL LOW (ref >60)
GFR calc non Af Amer: 5 mL/min — ABNORMAL LOW (ref >60)
GLUCOSE: 109 mg/dL — AB (ref 65–99)
Potassium: 5.7 mmol/L — ABNORMAL HIGH (ref 3.5–5.1)
Sodium: 138 mmol/L (ref 135–145)
TOTAL PROTEIN: 7.6 g/dL (ref 6.5–8.1)

## 2017-11-07 NOTE — ED Notes (Signed)
IV removed by this RN. PT is adamant that she does not want to stay and will not wait to be seen. Brayton Layman, Agricultural consultant made aware at this time. Pt ambulatory down the hall with no difficulty.

## 2017-11-07 NOTE — ED Triage Notes (Addendum)
Pt to ED via POV with c/o coughing blood x2 days, pt dialysis pt, last dialyzed yesterday. Pt c/o fatigue with symptoms. PT states hx of same and has had to receive transfusion. Pt A&Ox4. Pt wears 2L [place] chronically

## 2017-11-11 ENCOUNTER — Telehealth: Payer: Self-pay | Admitting: Emergency Medicine

## 2017-11-11 NOTE — Telephone Encounter (Signed)
Called patient due to lwot to inquire about condition and follow up plans.  She says her dialysis doctor is aware. Says the sx are getting better.   She is going for dialysis today. I asked her to ask the doctor to review the labs we did here.

## 2017-11-20 ENCOUNTER — Other Ambulatory Visit: Payer: Self-pay

## 2017-11-20 ENCOUNTER — Emergency Department: Payer: Medicare Other

## 2017-11-20 ENCOUNTER — Inpatient Hospital Stay
Admission: EM | Admit: 2017-11-20 | Discharge: 2017-11-24 | DRG: 204 | Disposition: A | Discharge status: Home Health | Payer: Medicare Other | Attending: Internal Medicine | Admitting: Internal Medicine

## 2017-11-20 DIAGNOSIS — J849 Interstitial pulmonary disease, unspecified: Secondary | ICD-10-CM | POA: Diagnosis present

## 2017-11-20 DIAGNOSIS — J9611 Chronic respiratory failure with hypoxia: Secondary | ICD-10-CM | POA: Diagnosis not present

## 2017-11-20 DIAGNOSIS — N186 End stage renal disease: Secondary | ICD-10-CM | POA: Diagnosis present

## 2017-11-20 DIAGNOSIS — D631 Anemia in chronic kidney disease: Secondary | ICD-10-CM | POA: Diagnosis present

## 2017-11-20 DIAGNOSIS — J441 Chronic obstructive pulmonary disease with (acute) exacerbation: Secondary | ICD-10-CM | POA: Diagnosis not present

## 2017-11-20 DIAGNOSIS — N2581 Secondary hyperparathyroidism of renal origin: Secondary | ICD-10-CM | POA: Diagnosis present

## 2017-11-20 DIAGNOSIS — K219 Gastro-esophageal reflux disease without esophagitis: Secondary | ICD-10-CM | POA: Diagnosis present

## 2017-11-20 DIAGNOSIS — R0489 Hemorrhage from other sites in respiratory passages: Secondary | ICD-10-CM | POA: Diagnosis present

## 2017-11-20 DIAGNOSIS — Z992 Dependence on renal dialysis: Secondary | ICD-10-CM | POA: Diagnosis not present

## 2017-11-20 DIAGNOSIS — Z87891 Personal history of nicotine dependence: Secondary | ICD-10-CM

## 2017-11-20 DIAGNOSIS — I132 Hypertensive heart and chronic kidney disease with heart failure and with stage 5 chronic kidney disease, or end stage renal disease: Secondary | ICD-10-CM | POA: Diagnosis present

## 2017-11-20 DIAGNOSIS — Z8249 Family history of ischemic heart disease and other diseases of the circulatory system: Secondary | ICD-10-CM | POA: Diagnosis not present

## 2017-11-20 DIAGNOSIS — R509 Fever, unspecified: Secondary | ICD-10-CM

## 2017-11-20 DIAGNOSIS — G9349 Other encephalopathy: Secondary | ICD-10-CM | POA: Diagnosis present

## 2017-11-20 DIAGNOSIS — M7989 Other specified soft tissue disorders: Secondary | ICD-10-CM | POA: Diagnosis present

## 2017-11-20 DIAGNOSIS — E785 Hyperlipidemia, unspecified: Secondary | ICD-10-CM | POA: Diagnosis present

## 2017-11-20 DIAGNOSIS — G4733 Obstructive sleep apnea (adult) (pediatric): Secondary | ICD-10-CM | POA: Diagnosis present

## 2017-11-20 DIAGNOSIS — F329 Major depressive disorder, single episode, unspecified: Secondary | ICD-10-CM | POA: Diagnosis present

## 2017-11-20 DIAGNOSIS — R042 Hemoptysis: Secondary | ICD-10-CM | POA: Diagnosis present

## 2017-11-20 DIAGNOSIS — J449 Chronic obstructive pulmonary disease, unspecified: Secondary | ICD-10-CM | POA: Diagnosis not present

## 2017-11-20 DIAGNOSIS — I5032 Chronic diastolic (congestive) heart failure: Secondary | ICD-10-CM | POA: Diagnosis present

## 2017-11-20 DIAGNOSIS — Z803 Family history of malignant neoplasm of breast: Secondary | ICD-10-CM

## 2017-11-20 HISTORY — DX: Disorder of kidney and ureter, unspecified: N28.9

## 2017-11-20 LAB — BASIC METABOLIC PANEL
Anion gap: 11 (ref 5–15)
BUN: 74 mg/dL — AB (ref 6–20)
CALCIUM: 7.5 mg/dL — AB (ref 8.9–10.3)
CHLORIDE: 99 mmol/L — AB (ref 101–111)
CO2: 29 mmol/L (ref 22–32)
CREATININE: 9.71 mg/dL — AB (ref 0.44–1.00)
GFR calc Af Amer: 4 mL/min — ABNORMAL LOW (ref >60)
GFR calc non Af Amer: 4 mL/min — ABNORMAL LOW (ref >60)
GLUCOSE: 166 mg/dL — AB (ref 65–99)
Potassium: 4.6 mmol/L (ref 3.5–5.1)
Sodium: 139 mmol/L (ref 135–145)

## 2017-11-20 LAB — CBC
HCT: 26.9 % — ABNORMAL LOW (ref 35.0–47.0)
Hemoglobin: 8.5 g/dL — ABNORMAL LOW (ref 12.0–16.0)
MCH: 32.8 pg (ref 26.0–34.0)
MCHC: 31.5 g/dL — AB (ref 32.0–36.0)
MCV: 104.3 fL — AB (ref 80.0–100.0)
PLATELETS: 260 10*3/uL (ref 150–440)
RBC: 2.58 MIL/uL — AB (ref 3.80–5.20)
RDW: 17.1 % — AB (ref 11.5–14.5)
WBC: 6.7 10*3/uL (ref 3.6–11.0)

## 2017-11-20 LAB — MRSA PCR SCREENING: MRSA BY PCR: NEGATIVE

## 2017-11-20 LAB — APTT: APTT: 30 s (ref 24–36)

## 2017-11-20 LAB — TYPE AND SCREEN
ABO/RH(D): O POS
Antibody Screen: NEGATIVE

## 2017-11-20 LAB — PROTIME-INR
INR: 1.01
Prothrombin Time: 13.2 seconds (ref 11.4–15.2)

## 2017-11-20 LAB — TROPONIN I: TROPONIN I: 0.14 ng/mL — AB (ref <0.03)

## 2017-11-20 MED ORDER — LEVOFLOXACIN IN D5W 250 MG/50ML IV SOLN
250.0000 mg | INTRAVENOUS | Status: DC
  Administered 2017-11-22: 250 mg via INTRAVENOUS
  Filled 2017-11-20 (×3): qty 50

## 2017-11-20 MED ORDER — FLUTICASONE PROPIONATE 50 MCG/ACT NA SUSP
2.0000 | Freq: Every day | NASAL | Status: DC | PRN
  Administered 2017-11-22 – 2017-11-23 (×2): 2 via NASAL
  Filled 2017-11-20 (×2): qty 16

## 2017-11-20 MED ORDER — VANCOMYCIN HCL 500 MG IV SOLR: 500.0000 mg | INTRAVENOUS | Status: DC

## 2017-11-20 MED ORDER — LIDOCAINE-PRILOCAINE 2.5-2.5 % EX CREA
1.0000 "application " | TOPICAL_CREAM | CUTANEOUS | Status: DC | PRN
  Filled 2017-11-20: qty 5

## 2017-11-20 MED ORDER — FUROSEMIDE 40 MG PO TABS: 40.0000 mg | ORAL_TABLET | Freq: Every day | ORAL | Status: DC

## 2017-11-20 MED ORDER — ALPRAZOLAM 0.5 MG PO TABS
0.5000 mg | ORAL_TABLET | Freq: Two times a day (BID) | ORAL | Status: DC
  Administered 2017-11-20 – 2017-11-24 (×6): 0.5 mg via ORAL
  Filled 2017-11-20 (×6): qty 1

## 2017-11-20 MED ORDER — DEXTROSE 5 % IV SOLN
1.0000 g | Freq: Once | INTRAVENOUS | Status: AC
  Administered 2017-11-20: 1 g via INTRAVENOUS
  Filled 2017-11-20: qty 1

## 2017-11-20 MED ORDER — ACETAMINOPHEN 650 MG RE SUPP: 650.0000 mg | Freq: Four times a day (QID) | RECTAL | Status: DC | PRN

## 2017-11-20 MED ORDER — EZETIMIBE 10 MG PO TABS
10.0000 mg | ORAL_TABLET | Freq: Every day | ORAL | Status: DC
  Administered 2017-11-22 – 2017-11-23 (×2): 10 mg via ORAL
  Filled 2017-11-20 (×4): qty 1

## 2017-11-20 MED ORDER — AMLODIPINE BESYLATE 10 MG PO TABS
10.0000 mg | ORAL_TABLET | Freq: Every day | ORAL | Status: DC
  Administered 2017-11-20 – 2017-11-24 (×3): 10 mg via ORAL
  Filled 2017-11-20: qty 2
  Filled 2017-11-20 (×2): qty 1

## 2017-11-20 MED ORDER — PANTOPRAZOLE SODIUM 40 MG PO TBEC
40.0000 mg | DELAYED_RELEASE_TABLET | Freq: Two times a day (BID) | ORAL | Status: DC
  Administered 2017-11-20 – 2017-11-24 (×7): 40 mg via ORAL
  Filled 2017-11-20 (×7): qty 1

## 2017-11-20 MED ORDER — HYDRALAZINE HCL 50 MG PO TABS
100.0000 mg | ORAL_TABLET | Freq: Three times a day (TID) | ORAL | Status: DC
  Administered 2017-11-20 – 2017-11-24 (×7): 100 mg via ORAL
  Filled 2017-11-20 (×7): qty 2

## 2017-11-20 MED ORDER — CARVEDILOL 12.5 MG PO TABS
12.5000 mg | ORAL_TABLET | Freq: Two times a day (BID) | ORAL | Status: DC
  Administered 2017-11-21 – 2017-11-24 (×4): 12.5 mg via ORAL
  Filled 2017-11-20 (×5): qty 1

## 2017-11-20 MED ORDER — ACETAMINOPHEN 325 MG PO TABS
650.0000 mg | ORAL_TABLET | Freq: Four times a day (QID) | ORAL | Status: DC | PRN
  Administered 2017-11-22 – 2017-11-23 (×2): 650 mg via ORAL
  Filled 2017-11-20 (×2): qty 2

## 2017-11-20 MED ORDER — SENNOSIDES-DOCUSATE SODIUM 8.6-50 MG PO TABS: 1.0000 | ORAL_TABLET | Freq: Every evening | ORAL | Status: DC | PRN

## 2017-11-20 MED ORDER — TORSEMIDE 20 MG PO TABS
100.0000 mg | ORAL_TABLET | Freq: Every day | ORAL | Status: DC
  Administered 2017-11-21 – 2017-11-24 (×3): 100 mg via ORAL
  Filled 2017-11-20 (×3): qty 5

## 2017-11-20 MED ORDER — ONDANSETRON HCL 4 MG PO TABS
4.0000 mg | ORAL_TABLET | Freq: Three times a day (TID) | ORAL | Status: DC | PRN
  Administered 2017-11-22 – 2017-11-23 (×2): 4 mg via ORAL
  Filled 2017-11-20 (×2): qty 1

## 2017-11-20 MED ORDER — VANCOMYCIN HCL 10 G IV SOLR
1250.0000 mg | Freq: Once | INTRAVENOUS | Status: AC
  Administered 2017-11-20: 1250 mg via INTRAVENOUS
  Filled 2017-11-20: qty 1250

## 2017-11-20 MED ORDER — TRAMADOL HCL 50 MG PO TABS: 50.0000 mg | ORAL_TABLET | Freq: Two times a day (BID) | ORAL | Status: DC | PRN

## 2017-11-20 MED ORDER — PRO-STAT SUGAR FREE PO LIQD
30.0000 mL | Freq: Every day | ORAL | Status: DC
  Administered 2017-11-23: 30 mL via ORAL

## 2017-11-20 MED ORDER — ALBUTEROL SULFATE (2.5 MG/3ML) 0.083% IN NEBU: 2.5000 mg | INHALATION_SOLUTION | Freq: Four times a day (QID) | RESPIRATORY_TRACT | Status: DC | PRN

## 2017-11-20 MED ORDER — HYDROCHLOROTHIAZIDE 25 MG PO TABS
25.0000 mg | ORAL_TABLET | Freq: Every day | ORAL | Status: DC
  Administered 2017-11-20: 25 mg via ORAL
  Filled 2017-11-20: qty 1

## 2017-11-20 MED ORDER — SERTRALINE HCL 50 MG PO TABS
50.0000 mg | ORAL_TABLET | Freq: Every day | ORAL | Status: DC
  Administered 2017-11-21 – 2017-11-24 (×4): 50 mg via ORAL
  Filled 2017-11-20 (×4): qty 1

## 2017-11-20 MED ORDER — SENNA-DOCUSATE SODIUM 8.6-50 MG PO TABS: 1.0000 | ORAL_TABLET | Freq: Every evening | ORAL | Status: DC | PRN

## 2017-11-20 MED ORDER — MONTELUKAST SODIUM 10 MG PO TABS
10.0000 mg | ORAL_TABLET | Freq: Every day | ORAL | Status: DC
  Administered 2017-11-20 – 2017-11-21 (×2): 10 mg via ORAL
  Filled 2017-11-20 (×2): qty 1

## 2017-11-20 MED ORDER — RENA-VITE PO TABS
1.0000 | ORAL_TABLET | Freq: Every day | ORAL | Status: DC
  Administered 2017-11-21 – 2017-11-23 (×3): 1 via ORAL
  Filled 2017-11-20 (×4): qty 1

## 2017-11-20 MED ORDER — CALCIUM ACETATE (PHOS BINDER) 667 MG PO CAPS
2001.0000 mg | ORAL_CAPSULE | Freq: Three times a day (TID) | ORAL | Status: DC
  Administered 2017-11-21 – 2017-11-24 (×6): 2001 mg via ORAL
  Filled 2017-11-20 (×11): qty 3

## 2017-11-20 MED ORDER — IPRATROPIUM-ALBUTEROL 0.5-2.5 (3) MG/3ML IN SOLN
3.0000 mL | Freq: Four times a day (QID) | RESPIRATORY_TRACT | Status: DC | PRN
  Filled 2017-11-20: qty 3

## 2017-11-20 MED ORDER — IOPAMIDOL (ISOVUE-370) INJECTION 76%
75.0000 mL | Freq: Once | INTRAVENOUS | Status: AC | PRN
  Administered 2017-11-20: 75 mL via INTRAVENOUS

## 2017-11-20 MED ORDER — LEVOFLOXACIN IN D5W 500 MG/100ML IV SOLN
500.0000 mg | Freq: Once | INTRAVENOUS | Status: AC
  Administered 2017-11-21: 500 mg via INTRAVENOUS
  Filled 2017-11-20: qty 100

## 2017-11-20 MED ORDER — IBUPROFEN 400 MG PO TABS: 400.0000 mg | ORAL_TABLET | Freq: Four times a day (QID) | ORAL | Status: DC | PRN

## 2017-11-20 MED ORDER — LOSARTAN POTASSIUM 50 MG PO TABS
100.0000 mg | ORAL_TABLET | Freq: Every day | ORAL | Status: DC
  Administered 2017-11-20 – 2017-11-24 (×4): 100 mg via ORAL
  Filled 2017-11-20 (×4): qty 2

## 2017-11-20 MED ORDER — DIPHENHYDRAMINE HCL 50 MG/ML IJ SOLN
25.0000 mg | Freq: Four times a day (QID) | INTRAMUSCULAR | Status: DC | PRN
  Administered 2017-11-20 – 2017-11-23 (×3): 25 mg via INTRAVENOUS
  Filled 2017-11-20 (×3): qty 1

## 2017-11-20 MED ORDER — CINACALCET HCL 30 MG PO TABS
60.0000 mg | ORAL_TABLET | Freq: Every day | ORAL | Status: DC
Start: 2017-11-21 — End: 2017-11-24
  Administered 2017-11-22 – 2017-11-24 (×3): 60 mg via ORAL
  Filled 2017-11-20 (×4): qty 2

## 2017-11-20 NOTE — ED Notes (Signed)
Patient up to urinate with staff assistance

## 2017-11-20 NOTE — Progress Notes (Signed)
ANTIBIOTIC CONSULT NOTE - INITIAL  Pharmacy Consult for vancomycin Indication: pneumonia  Allergies  Allergen Reactions  . Sulfa Antibiotics Itching, Swelling, Rash and Other (See Comments)    Reaction:  Facial/body swelling     Patient Measurements: Height: 5\' 5"  (165.1 cm) Weight: 130 lb (59 kg) IBW/kg (Calculated) : 57 Adjusted Body Weight:   Vital Signs: Temp: 98.9 F (37.2 C) (12/26 1510) Temp Source: Oral (12/26 1510) BP: 175/107 (12/26 1648) Pulse Rate: 95 (12/26 1510) Intake/Output from previous day: No intake/output data recorded. Intake/Output from this shift: No intake/output data recorded.  Labs: Recent Labs    11/20/17 1515  WBC 6.7  HGB 8.5*  PLT 260  CREATININE 9.71*   Estimated Creatinine Clearance: 5.3 mL/min (A) (by C-G formula based on SCr of 9.71 mg/dL (H)). No results for input(s): VANCOTROUGH, VANCOPEAK, VANCORANDOM, GENTTROUGH, GENTPEAK, GENTRANDOM, TOBRATROUGH, TOBRAPEAK, TOBRARND, AMIKACINPEAK, AMIKACINTROU, AMIKACIN in the last 72 hours.   Microbiology: No results found for this or any previous visit (from the past 720 hour(s)).  Medical History: Past Medical History:  Diagnosis Date  . Altered mental status   . Anemia   . Apnea, sleep    for sleep study 10/17/15-no cpap yet  . Arthritis   . Bronchitis   . CHF (congestive heart failure) ([place])   . Chronic kidney disease   . COPD (chronic obstructive pulmonary disease) ([place])   . Depression   . Dialysis patient ([place]) 2014  . GERD (gastroesophageal reflux disease)   . GIB (gastrointestinal bleeding) 07/09/2016  . Headache   . Hyperlipidemia   . Hypertension   . Obesity   . Pulmonary edema 09/14/2016  . Renal dialysis device, implant, or graft complication    RIGHT CHEST CATH  . Renal insufficiency   . Respiratory failure ([place]) 07/23/2016  . Traumatic hematoma of forehead   . Type II diabetes mellitus ([place]) 09/21/2009   diet controlled    Medications:  Infusions:  .  ceFEPime (MAXIPIME) IV    . vancomycin    . [START ON 11/22/2017] vancomycin     Assessment: 61 yof cc hemoptysis and leg swelling. PMH significant for ESRD on HD MWF however schedule has been changed recently due to the holidays. Pharmacy consulted to dose vancomycin for PNA.   Goal of Therapy:  Vancomycin trough level 15-20 mcg/ml  Plan:  Vancomycin 1.25 gm IV x 1 followed by vancomycin 500 mg IV Q-dialysis MWF. Pharmacy will continue to follow and adjust as needed to maintain vanc random 15 to 20 mcg/mL.   Laural Benes, Pharm.D., BCPS Clinical Pharmacist 11/20/2017,5:37 PM

## 2017-11-20 NOTE — ED Triage Notes (Signed)
Pt c/o coughing up blood for the past 2 weeks and swelling to the right leg and arm for the past month, states she was suppose to have dialysis today but they referred her to the ED. States she was here the other day for same sx but left due to her husband having to leave to go to work.Marland Kitchen

## 2017-11-20 NOTE — H&P (Signed)
[facility] NAME: Michele Nash    MR#:  [id number]  DATE OF BIRTH:  1956/08/16  DATE OF ADMISSION:  11/20/2017  PRIMARY CARE PHYSICIAN: [name], MD   REQUESTING/REFERRING PHYSICIAN: dr Burlene Arnt  CHIEF COMPLAINT:   Hemoptysis HISTORY OF PRESENT ILLNESS:  Michele Nash  is a 61 y.o. female with a known history of end-stage renal disease on hemodialysis, chronic diastolic heart failure, COPD with chronic respiratory failure on 4 L of oxygen who presents today due to hemoptysis. Patient has had several months of hemoptysis however one point it had improved. She presents today due to ongoing hemoptysis. She has had several episodes of hemoptysis today while in the emergency room. She denies fever or chills or cough. She denies lower extremity edema, PND or orthopnea. She goes to dialysis on Monday Wednesday and Friday. She did not go to dialysis today due to concern of hemoptysis. She denies shortness of breath.   PAST MEDICAL HISTORY:   Past Medical History:  Diagnosis Date  . Altered mental status   . Anemia   . Apnea, sleep    for sleep study 10/17/15-no cpap yet  . Arthritis   . Bronchitis   . CHF (congestive heart failure) ([place])   . Chronic kidney disease   . COPD (chronic obstructive pulmonary disease) ([place])   . Depression   . Dialysis patient ([place]) 2014  . GERD (gastroesophageal reflux disease)   . GIB (gastrointestinal bleeding) 07/09/2016  . Headache   . Hyperlipidemia   . Hypertension   . Obesity   . Pulmonary edema 09/14/2016  . Renal dialysis device, implant, or graft complication    RIGHT CHEST CATH  . Renal insufficiency   . Respiratory failure ([place]) 07/23/2016  . Traumatic hematoma of forehead   . Type II diabetes mellitus ([place]) 09/21/2009   diet controlled    PAST SURGICAL HISTORY:   Past Surgical History:  Procedure Laterality Date  . ABDOMINAL HYSTERECTOMY    . COLONOSCOPY  2011  . COLONOSCOPY  WITH PROPOFOL N/A 09/20/2015   Procedure: COLONOSCOPY WITH PROPOFOL;  Surgeon: Lucilla Lame, MD;  Location: [facility] ENDOSCOPY;  Service: Endoscopy;  Laterality: N/A;  . DIALYSIS FISTULA CREATION    . ESOPHAGOGASTRODUODENOSCOPY N/A 07/13/2016   Procedure: ESOPHAGOGASTRODUODENOSCOPY (EGD);  Surgeon: Lollie Sails, MD;  Location: [facility] ENDOSCOPY;  Service: Endoscopy;  Laterality: N/A;  . ESOPHAGOGASTRODUODENOSCOPY (EGD) WITH PROPOFOL N/A 06/02/2016   Procedure: ESOPHAGOGASTRODUODENOSCOPY (EGD) WITH PROPOFOL;  Surgeon: Manya Silvas, MD;  Location: [facility] ENDOSCOPY;  Service: Endoscopy;  Laterality: N/A;  . LIGATION OF ARTERIOVENOUS  FISTULA Left 05/24/2017   Procedure: LIGATION OF ARTERIOVENOUS  FISTULA ( PERMCATH INSERTION );  Surgeon: Katha Cabal, MD;  Location: [facility] ORS;  Service: Vascular;  Laterality: Left;  . PERIPHERAL VASCULAR CATHETERIZATION N/A 05/10/2015   Procedure: A/V Shuntogram/Fistulagram;  Surgeon: Katha Cabal, MD;  Location: [place] CV LAB;  Service: Cardiovascular;  Laterality: N/A;  . PERIPHERAL VASCULAR CATHETERIZATION Left 05/10/2015   Procedure: A/V Shunt Intervention;  Surgeon: Katha Cabal, MD;  Location: [place] CV LAB;  Service: Cardiovascular;  Laterality: Left;  . PERIPHERAL VASCULAR CATHETERIZATION Left 08/23/2015   Procedure: A/V Shuntogram/Fistulagram;  Surgeon: Katha Cabal, MD;  Location: [place] CV LAB;  Service: Cardiovascular;  Laterality: Left;  . PERIPHERAL VASCULAR CATHETERIZATION N/A 08/23/2015   Procedure: A/V Shunt Intervention;  Surgeon: Katha Cabal, MD;  Location: [place] CV LAB;  Service: Cardiovascular;  Laterality: N/A;  . PERIPHERAL VASCULAR CATHETERIZATION  08/23/2015   Procedure: Dialysis/Perma Catheter Insertion;  Surgeon: Katha Cabal, MD;  Location: [place] CV LAB;  Service: Cardiovascular;;  . PERIPHERAL VASCULAR CATHETERIZATION N/A 01/03/2016   Procedure: Dialysis/Perma Catheter Removal;   Surgeon: Katha Cabal, MD;  Location: [place] CV LAB;  Service: Cardiovascular;  Laterality: N/A;  . REVISON OF ARTERIOVENOUS FISTULA Left 10/28/2015   Procedure: REVISON OF ARTERIOVENOUS FISTULA WITH ARTEGRAFT;  Surgeon: Katha Cabal, MD;  Location: [facility] ORS;  Service: Vascular;  Laterality: Left;  . WOUND DEBRIDEMENT Left 10/28/2015   Procedure: Resection of shoulder cyst ( left );  Surgeon: Katha Cabal, MD;  Location: [facility] ORS;  Service: Vascular;  Laterality: Left;    SOCIAL HISTORY:   Social History   Tobacco Use  . Smoking status: Former Smoker    Years: 40.00    Types: Cigarettes    Last attempt to quit: 07/28/2017    Years since quitting: 0.3  . Smokeless tobacco: Never Used  Substance Use Topics  . Alcohol use: No    FAMILY HISTORY:   Family History  Problem Relation Age of Onset  . Heart disease Mother   . Cancer Father   . Cancer Sister   . Breast cancer Sister     DRUG ALLERGIES:   Allergies  Allergen Reactions  . Sulfa Antibiotics Itching, Swelling, Rash and Other (See Comments)    Reaction:  Facial/body swelling     REVIEW OF SYSTEMS:   Review of Systems  Constitutional: Negative.  Negative for chills, fever and malaise/fatigue.  HENT: Negative.  Negative for ear discharge, ear pain, hearing loss, nosebleeds and sore throat.   Eyes: Negative.  Negative for blurred vision and pain.  Respiratory: Positive for hemoptysis. Negative for cough, shortness of breath and wheezing.   Cardiovascular: Negative.  Negative for chest pain, palpitations and leg swelling.  Gastrointestinal: Negative.  Negative for abdominal pain, blood in stool, diarrhea, nausea and vomiting.  Genitourinary: Negative.  Negative for dysuria.  Musculoskeletal: Negative.  Negative for back pain.  Skin: Negative.   Neurological: Negative for dizziness, tremors, speech change, focal weakness, seizures and headaches.  Endo/Heme/Allergies: Negative.  Does not bruise/bleed  easily.  Psychiatric/Behavioral: Negative.  Negative for depression, hallucinations and suicidal ideas.    MEDICATIONS AT HOME:   Prior to Admission medications   Medication Sig Start Date End Date Taking? Authorizing Provider  albuterol (PROVENTIL HFA;VENTOLIN HFA) 108 (90 Base) MCG/ACT inhaler Inhale 2 puffs every 6 (six) hours as needed into the lungs for wheezing or shortness of breath. 10/07/17   Vaughan Basta, MD  ALPRAZolam Duanne Moron) 0.5 MG tablet Take 0.5 mg by mouth 2 (two) times daily.    [provider]  Amino Acids-Protein Hydrolys (FEEDING SUPPLEMENT, PRO-STAT SUGAR FREE 64,) LIQD Take 30 mLs by mouth daily. 12/12/16   Gouru, Illene Silver, MD  amLODipine (NORVASC) 10 MG tablet Take 10 mg by mouth daily.     [provider]  calcium acetate (PHOSLO) 667 MG capsule Take 3 capsules (2,001 mg total) by mouth 3 (three) times daily with meals. 06/20/17   Loletha Grayer, MD  carvedilol (COREG) 12.5 MG tablet Take 12.5 mg by mouth 2 (two) times daily with a meal.    [provider]  cinacalcet (SENSIPAR) 30 MG tablet Take 60 mg by mouth daily.     [provider]  ezetimibe (ZETIA) 10 MG tablet Take 10 mg by mouth daily.  [provider]  fluticasone (FLONASE) 50 MCG/ACT nasal spray Place 2 sprays into both nostrils daily as needed for rhinitis.    [provider]  furosemide (LASIX) 40 MG tablet Take 40 mg by mouth daily.     [provider]  hydrALAZINE (APRESOLINE) 50 MG tablet Take 2 tablets (100 mg total) by mouth 3 (three) times daily. 10/15/17   Dustin Flock, MD  hydrochlorothiazide (HYDRODIURIL) 25 MG tablet Take 1 tablet (25 mg total) by mouth daily. 09/11/17   Menshew, Dannielle Karvonen, PA-C  ipratropium-albuterol (DUONEB) 0.5-2.5 (3) MG/3ML SOLN Take 3 mLs by nebulization every 6 (six) hours as needed. 12/11/16   Nicholes Mango, MD  lidocaine-prilocaine (EMLA) cream Apply 1 application topically as needed (prior to  accessing port).    [provider]  losartan (COZAAR) 100 MG tablet Take 100 mg by mouth daily.     [provider]  montelukast (SINGULAIR) 10 MG tablet Take 1 tablet (10 mg total) by mouth at bedtime. 10/15/17   Dustin Flock, MD  multivitamin (RENA-VIT) TABS tablet Take 1 tablet by mouth at bedtime.     [provider]  ondansetron (ZOFRAN) 4 MG tablet Take 1 tablet (4 mg total) by mouth every 8 (eight) hours as needed for nausea or vomiting. 03/24/17   Lisa Roca, MD  pantoprazole (PROTONIX) 40 MG tablet Take 40 mg by mouth 2 (two) times daily.    [provider]  sennosides-docusate sodium (SENOKOT-S) 8.6-50 MG tablet Take 1 tablet by mouth at bedtime as needed for constipation.    [provider]  sertraline (ZOLOFT) 50 MG tablet Take 50 mg by mouth daily.     [provider]  torsemide (DEMADEX) 100 MG tablet Take 1 tablet (100 mg total) by mouth daily. 10/15/17   Dustin Flock, MD  traMADol (ULTRAM) 50 MG tablet Take 1 tablet (50 mg total) by mouth every 12 (twelve) hours as needed for severe pain. 08/04/17 08/04/18  Arta Silence, MD      VITAL SIGNS:  Blood pressure (!) 175/107, pulse 95, temperature 98.9 F (37.2 C), temperature source Oral, resp. rate (!) 27, height 5\' 5"  (1.651 m), weight 59 kg (130 lb), SpO2 100 %.  PHYSICAL EXAMINATION:   Physical Exam  Constitutional: She is oriented to person, place, and time and well-developed, well-nourished, and in no distress. No distress.  HENT:  Head: Normocephalic.  Eyes: No scleral icterus.  Neck: Normal range of motion. Neck supple. No JVD present. No tracheal deviation present.  Cardiovascular: Normal rate, regular rhythm and normal heart sounds. Exam reveals no gallop and no friction rub.  No murmur heard. Pulmonary/Chest: Effort normal and breath sounds normal. No respiratory distress. She has no wheezes. She has no rales. She exhibits no tenderness.  Abdominal:  Soft. Bowel sounds are normal. She exhibits no distension and no mass. There is no tenderness. There is no rebound and no guarding.  Musculoskeletal: Normal range of motion. She exhibits no edema.  Neurological: She is alert and oriented to person, place, and time.  Skin: Skin is warm. No rash noted. No erythema.  Psychiatric: Affect and judgment normal.      LABORATORY PANEL:   CBC Recent Labs  Lab 11/20/17 1515  WBC 6.7  HGB 8.5*  HCT 26.9*  PLT 260   ------------------------------------------------------------------------------------------------------------------  Chemistries  Recent Labs  Lab 11/20/17 1515  NA 139  K 4.6  CL 99*  CO2 29  GLUCOSE 166*  BUN 74*  CREATININE  9.71*  CALCIUM 7.5*   ------------------------------------------------------------------------------------------------------------------  Cardiac Enzymes Recent Labs  Lab 11/20/17 1515  TROPONINI 0.14*   ------------------------------------------------------------------------------------------------------------------  RADIOLOGY:  Dg Chest 2 View  Result Date: 11/20/2017 CLINICAL DATA:  61 year old female with hemoptysis, bloody sputum for 2 weeks. Sent to the emergency department from the dialysis center today. EXAM: CHEST  2 VIEW COMPARISON:  10/15/2017 and earlier. FINDINGS: Seated AP and lateral views of the chest. Stable lung volumes. Stable cardiomegaly and mediastinal contours, including tortuous aorta. Chronic right chest dual lumen dialysis type catheter. Continued bilateral increased pulmonary interstitial markings, mildly confluent today in both the right suprahilar region and right lung base. Questionable small pleural effusions. No acute osseous abnormality identified. Negative visible bowel gas pattern. Visualized tracheal air column is within normal limits. IMPRESSION: 1. Continued diffuse bilateral increased pulmonary interstitial markings thought related to pulmonary interstitial  edema. But there is increasing confluence of opacity in the right lung since November which could relate to infection or alveolar hemorrhage in the setting of hemoptysis. 2. Chronic cardiomegaly is stable. Stable right chest dialysis catheter. Electronically Signed   By: Genevie Ann M.D.   On: 11/20/2017 15:38   Ct Angio Chest Pe W And/or Wo Contrast  Result Date: 11/20/2017 CLINICAL DATA:  Hemoptysis for several weeks. Chronic shortness of breath. Dialysis patient. Chronic right lower extremity swelling. EXAM: CT ANGIOGRAPHY CHEST WITH CONTRAST TECHNIQUE: Multidetector CT imaging of the chest was performed using the standard protocol during bolus administration of intravenous contrast. Multiplanar CT image reconstructions and MIPs were obtained to evaluate the vascular anatomy. CONTRAST:  19mL ISOVUE-370 IOPAMIDOL (ISOVUE-370) INJECTION 76% COMPARISON:  Chest CT dated 06/18/2017.  Chest CT dated 12/04/2016. FINDINGS: Cardiovascular: Some of the most peripheral segmental and subsegmental pulmonary artery branches cannot be definitively characterized due to patient breathing motion artifact, however, there is no pulmonary embolism identified within the main, lobar or central segmental pulmonary arteries bilaterally. Main pulmonary artery measures 4 cm diameter suggesting the possibility of pulmonary artery hypertension. No thoracic aortic aneurysm or dissection. Aortic atherosclerosis. Cardiomegaly appears stable. No pericardial effusion. Mediastinum/Nodes: Scattered small lymph nodes within the mediastinum and perihilar regions. No enlarged or morphologically abnormal lymph nodes identified. Esophagus is somewhat patulous but otherwise unremarkable. Trachea and central bronchi are unremarkable. Lungs/Pleura: Increasingly dense reticular and airspace opacities within the bilateral upper lobes, now also involving the superior segments of the lower lobes bilaterally, predominantly peripheral. Additional mild bibasilar  atelectasis. No pneumothorax or pleural effusion seen. Upper Abdomen: Limited images of the upper abdomen are unremarkable. Bilateral renal cysts, incompletely imaged. Musculoskeletal: Osseous structures diffusely sclerotic, compatible with previously described renal osteodystrophy. Review of the MIP images confirms the above findings. IMPRESSION: 1. Increasingly dense reticular and alveolar opacities within the upper lobes bilaterally, peripheral distribution, also involving the superior segments of the lower lobes bilaterally. These portions of the lungs were clear on earlier study of 12/04/2016 indicating a significant progression of disease. Differential includes bacteria pneumonia, pulmonary edema or pulmonary hemorrhage, eosinophilic pneumonia and interstitial pneumonia. 2. No pulmonary embolism seen. 3. Prominent size of the main pulmonary artery raising the possibility of pulmonary artery hypertension. 4. Aortic atherosclerosis. 5. Osseous structures are diffusely sclerotic, compatible with renal osteodystrophy. Electronically Signed   By: Franki Cabot M.D.   On: 11/20/2017 17:25    EKG:  Normal sinus rhythm no ST elevation or depression  IMPRESSION AND PLAN:   61 year old female with history of end-stage renal disease on hemodialysis, chronic diastolic heart failure and chronic respiratory  failure due to COPD on 4 L of oxygen who presents with hemoptysis.  1. Hemoptysis: CT scan does not show evidence of pulmonary emboli however it is concerning for increasing dense reticular and alveolar opacities within the upper lobes concerning for possible pulmonary hemorrhage versus interstitial lung disease Pulmonary consultation requested. She does follow with Dr. Ashby Dawes Start Levaquin empirically for treatment of pneumonia Follow CBC  2. Acute on chronic anemia from chronic disease Follow CBC  3. End-stage renal disease on hemodialysis: Patient will have dialysis schedule for tomorrow Case  discussed with Dr. Candiss Norse  4. Chronic hypoxic respiratory failure due to COPD without signs of exacerbation Continue nebs and inhalers 5. Essential hypertension: Continue Norvasc, Coreg, hydralazine, HCTZ, losartan  6. Chronic diastolic heart failure without signs of exacerbation: Continue Lasix     All the records are reviewed and case discussed with ED provider. Management plans discussed with the patient and she is  in agreement  CODE STATUS: full  TOTAL TIME TAKING CARE OF THIS PATIENT: 41 minutes.    Itai Barbian M.D on 11/20/2017 at 5:38 PM  Between 7am to 6pm - Pager - [phone]  After 6pm go to [email or web address] - password [email or web address] [facility]  Office  [phone]  CC: Primary care physician; [name], MD

## 2017-11-20 NOTE — ED Notes (Signed)
Pt to bathroom and back in bed at this time.

## 2017-11-20 NOTE — ED Notes (Addendum)
Released Protonix order for heartburn and told Dr. Jerelyn Charles that patient was having some itching at IV site with vancomycin and told to give Iv benadryl 25 q6 hours.

## 2017-11-20 NOTE — ED Notes (Signed)
First Nurse Note:  Complaining of swelling in right leg.  Right leg obviously larger than left.  History of coughing up "blood clots" per patient.  Sitting in [place] on 4 L O2.  Patient states she is on home O2 because of COPD.  Alert and oriented.  Speech is clear.

## 2017-11-20 NOTE — ED Notes (Signed)
Reports ongoing right upper and lower extremity swelling and pain. Pt reports coughing up blood X 2 weeks. Pt was supposed to have dialysis today, sent to ER instead. Pt appears comfortable, in NAD.

## 2017-11-20 NOTE — ED Notes (Signed)
Report to Rebecca, RN.

## 2017-11-20 NOTE — ED Provider Notes (Addendum)
[facility] Emergency Department Provider Note  ____________________________________________   I have reviewed the triage vital signs and the nursing notes. Where available I have reviewed prior notes and, if possible and indicated, outside hospital notes.    HISTORY  Chief Complaint Hemoptysis and Leg Swelling    HPI Michele Nash is a 61 y.o. female who presents today complaining of hemoptysis for several weeks, most of the month in fact.  She did come in here earlier this month to talk to [place] about it but unfortunately had to leave because of her husband's work schedule.  She denies any fever or chills.  She is not more short of breath than normal she is normally on oxygen at home she does not feel that she has had increased the oxygen patient also has a history of end-stage renal disease, she had dialysis most recently on Sunday normally she is Monday Wednesday and Friday however she had a change in her schedule because of the holiday.  Patient states that they took off the normal amount.  She denies any chest pain.  She has had hemoptysis before it was thought secondary to a infectious pathology to the extent that she can recall she states she has had to have blood transmissions for it.  Patient does have chronic right lower extremity swelling which she states has been "ruled out" for DVT in the past.  The reason she is here principally is because of her hemoptysis.  No known history of PE or DVT.       Past Medical History:  Diagnosis Date  . Altered mental status   . Anemia   . Apnea, sleep    for sleep study 10/17/15-no cpap yet  . Arthritis   . Bronchitis   . CHF (congestive heart failure) ([place])   . Chronic kidney disease   . COPD (chronic obstructive pulmonary disease) ([place])   . Depression   . Dialysis patient ([place]) 2014  . GERD (gastroesophageal reflux disease)   . GIB (gastrointestinal bleeding) 07/09/2016  . Headache   . Hyperlipidemia   .  Hypertension   . Obesity   . Pulmonary edema 09/14/2016  . Renal dialysis device, implant, or graft complication    RIGHT CHEST CATH  . Respiratory failure ([place]) 07/23/2016  . Traumatic hematoma of forehead   . Type II diabetes mellitus ([place]) 09/21/2009   diet controlled    Patient Active Problem List   Diagnosis Date Noted  . Dizziness 06/15/2017  . Chronic diastolic CHF (congestive heart failure) ([place]) 06/15/2017  . Hyperkalemia 05/21/2017  . Multiple closed fractures of ribs of right side   . Hyperlipidemia 11/28/2016  . Altered mental status   . Fall   . Protein-calorie malnutrition, severe 05/29/2016  . Hyperglycemia 05/28/2016  . Pain in limb 10/28/2015  . ESRD on dialysis ([place]) 10/28/2015  . HTN (hypertension) 03/29/2015  . COPD (chronic obstructive pulmonary disease) ([place]) 03/29/2015  . GAD (generalized anxiety disorder) 03/29/2015  . Chronic kidney disease 10/18/2009  . Personal history of tobacco use, presenting hazards to health 10/18/2009  . Type II diabetes mellitus ([place]) 09/21/2009  . Disorder of uterus 02/14/1990    Past Surgical History:  Procedure Laterality Date  . ABDOMINAL HYSTERECTOMY    . COLONOSCOPY  2011  . COLONOSCOPY WITH PROPOFOL N/A 09/20/2015   Procedure: COLONOSCOPY WITH PROPOFOL;  Surgeon: Lucilla Lame, MD;  Location: [facility] ENDOSCOPY;  Service: Endoscopy;  Laterality: N/A;  . DIALYSIS FISTULA CREATION    .  ESOPHAGOGASTRODUODENOSCOPY N/A 07/13/2016   Procedure: ESOPHAGOGASTRODUODENOSCOPY (EGD);  Surgeon: Lollie Sails, MD;  Location: [facility] ENDOSCOPY;  Service: Endoscopy;  Laterality: N/A;  . ESOPHAGOGASTRODUODENOSCOPY (EGD) WITH PROPOFOL N/A 06/02/2016   Procedure: ESOPHAGOGASTRODUODENOSCOPY (EGD) WITH PROPOFOL;  Surgeon: Manya Silvas, MD;  Location: [facility] ENDOSCOPY;  Service: Endoscopy;  Laterality: N/A;  . LIGATION OF ARTERIOVENOUS  FISTULA Left 05/24/2017   Procedure: LIGATION OF ARTERIOVENOUS  FISTULA ( PERMCATH INSERTION );  Surgeon:  Katha Cabal, MD;  Location: [facility] ORS;  Service: Vascular;  Laterality: Left;  . PERIPHERAL VASCULAR CATHETERIZATION N/A 05/10/2015   Procedure: A/V Shuntogram/Fistulagram;  Surgeon: Katha Cabal, MD;  Location: [place] CV LAB;  Service: Cardiovascular;  Laterality: N/A;  . PERIPHERAL VASCULAR CATHETERIZATION Left 05/10/2015   Procedure: A/V Shunt Intervention;  Surgeon: Katha Cabal, MD;  Location: [place] CV LAB;  Service: Cardiovascular;  Laterality: Left;  . PERIPHERAL VASCULAR CATHETERIZATION Left 08/23/2015   Procedure: A/V Shuntogram/Fistulagram;  Surgeon: Katha Cabal, MD;  Location: [place] CV LAB;  Service: Cardiovascular;  Laterality: Left;  . PERIPHERAL VASCULAR CATHETERIZATION N/A 08/23/2015   Procedure: A/V Shunt Intervention;  Surgeon: Katha Cabal, MD;  Location: [place] CV LAB;  Service: Cardiovascular;  Laterality: N/A;  . PERIPHERAL VASCULAR CATHETERIZATION  08/23/2015   Procedure: Dialysis/Perma Catheter Insertion;  Surgeon: Katha Cabal, MD;  Location: [place] CV LAB;  Service: Cardiovascular;;  . PERIPHERAL VASCULAR CATHETERIZATION N/A 01/03/2016   Procedure: Dialysis/Perma Catheter Removal;  Surgeon: Katha Cabal, MD;  Location: [place] CV LAB;  Service: Cardiovascular;  Laterality: N/A;  . REVISON OF ARTERIOVENOUS FISTULA Left 10/28/2015   Procedure: REVISON OF ARTERIOVENOUS FISTULA WITH ARTEGRAFT;  Surgeon: Katha Cabal, MD;  Location: [facility] ORS;  Service: Vascular;  Laterality: Left;  . WOUND DEBRIDEMENT Left 10/28/2015   Procedure: Resection of shoulder cyst ( left );  Surgeon: Katha Cabal, MD;  Location: [facility] ORS;  Service: Vascular;  Laterality: Left;    Prior to Admission medications   Medication Sig Start Date End Date Taking? Authorizing Provider  albuterol (PROVENTIL HFA;VENTOLIN HFA) 108 (90 Base) MCG/ACT inhaler Inhale 2 puffs every 6 (six) hours as needed into the lungs for wheezing or  shortness of breath. 10/07/17   Vaughan Basta, MD  ALPRAZolam Duanne Moron) 0.5 MG tablet Take 0.5 mg by mouth 2 (two) times daily.    [provider]  Amino Acids-Protein Hydrolys (FEEDING SUPPLEMENT, PRO-STAT SUGAR FREE 64,) LIQD Take 30 mLs by mouth daily. 12/12/16   Gouru, Illene Silver, MD  amLODipine (NORVASC) 10 MG tablet Take 10 mg by mouth daily.     [provider]  calcium acetate (PHOSLO) 667 MG capsule Take 3 capsules (2,001 mg total) by mouth 3 (three) times daily with meals. 06/20/17   Loletha Grayer, MD  carvedilol (COREG) 12.5 MG tablet Take 12.5 mg by mouth 2 (two) times daily with a meal.    [provider]  cinacalcet (SENSIPAR) 30 MG tablet Take 60 mg by mouth daily.     [provider]  ezetimibe (ZETIA) 10 MG tablet Take 10 mg by mouth daily.     [provider]  fluticasone (FLONASE) 50 MCG/ACT nasal spray Place 2 sprays into both nostrils daily as needed for rhinitis.    [provider]  furosemide (LASIX) 40 MG tablet Take 40 mg by mouth daily.     [provider]  hydrALAZINE (APRESOLINE) 50 MG tablet Take 2 tablets (100 mg total) by  mouth 3 (three) times daily. 10/15/17   Dustin Flock, MD  hydrochlorothiazide (HYDRODIURIL) 25 MG tablet Take 1 tablet (25 mg total) by mouth daily. 09/11/17   Menshew, Dannielle Karvonen, PA-C  ipratropium-albuterol (DUONEB) 0.5-2.5 (3) MG/3ML SOLN Take 3 mLs by nebulization every 6 (six) hours as needed. 12/11/16   Nicholes Mango, MD  lidocaine-prilocaine (EMLA) cream Apply 1 application topically as needed (prior to accessing port).    [provider]  losartan (COZAAR) 100 MG tablet Take 100 mg by mouth daily.     [provider]  montelukast (SINGULAIR) 10 MG tablet Take 1 tablet (10 mg total) by mouth at bedtime. 10/15/17   Dustin Flock, MD  multivitamin (RENA-VIT) TABS tablet Take 1 tablet by mouth at bedtime.     [provider]  ondansetron (ZOFRAN) 4  MG tablet Take 1 tablet (4 mg total) by mouth every 8 (eight) hours as needed for nausea or vomiting. 03/24/17   Lisa Roca, MD  pantoprazole (PROTONIX) 40 MG tablet Take 40 mg by mouth 2 (two) times daily.    [provider]  sennosides-docusate sodium (SENOKOT-S) 8.6-50 MG tablet Take 1 tablet by mouth at bedtime as needed for constipation.    [provider]  sertraline (ZOLOFT) 50 MG tablet Take 50 mg by mouth daily.     [provider]  torsemide (DEMADEX) 100 MG tablet Take 1 tablet (100 mg total) by mouth daily. 10/15/17   Dustin Flock, MD  traMADol (ULTRAM) 50 MG tablet Take 1 tablet (50 mg total) by mouth every 12 (twelve) hours as needed for severe pain. 08/04/17 08/04/18  Arta Silence, MD    Allergies Sulfa antibiotics  Family History  Problem Relation Age of Onset  . Heart disease Mother   . Cancer Father   . Cancer Sister   . Breast cancer Sister     Social History Social History   Tobacco Use  . Smoking status: Former Smoker    Years: 40.00    Types: Cigarettes    Last attempt to quit: 07/28/2017    Years since quitting: 0.3  . Smokeless tobacco: Never Used  Substance Use Topics  . Alcohol use: No  . Drug use: No    Review of Systems Constitutional: No fever/chills Eyes: No visual changes. ENT: No sore throat. No stiff neck no neck pain Cardiovascular: Denies chest pain. Respiratory: see hpi Gastrointestinal:   no vomiting.  No diarrhea.  No constipation. Genitourinary: Negative for dysuria. Musculoskeletal: Negative lower extremity swelling Skin: Negative for rash. Neurological: Negative for severe headaches, focal weakness or numbness.   ____________________________________________   PHYSICAL EXAM:  VITAL SIGNS: ED Triage Vitals  Enc Vitals Group     BP 11/20/17 1510 (!) 150/104     Pulse Rate 11/20/17 1510 95     Resp 11/20/17 1510 18     Temp 11/20/17 1510 98.9 F (37.2 C)     Temp Source 11/20/17 1510 Oral      SpO2 11/20/17 1510 100 %     Weight 11/20/17 1511 130 lb (59 kg)     Height 11/20/17 1511 5\' 5"  (1.651 m)     Head Circumference --      Peak Flow --      Pain Score 11/20/17 1510 8     Pain Loc --      Pain Edu? --      Excl. in [place]? --     Constitutional: Alert and oriented. Well appearing and  in no acute distress. Eyes: Conjunctivae are normal Head: Atraumatic HEENT: No congestion/rhinnorhea. Mucous membranes are moist.  Oropharynx non-erythematous Neck:   Nontender with no meningismus, no masses, no stridor Cardiovascular: Normal rate, regular rhythm. Grossly normal heart sounds.  Good peripheral circulation. Respiratory: Normal respiratory effort.  No retractions.  Diffuse rales noted, more on the right than the left.  Good air movement no wheezing. Abdominal: Soft and nontender. No distention. No guarding no rebound Back:  There is no focal tenderness or step off.  there is no midline tenderness there are no lesions noted. there is no CVA tenderness Musculoskeletal: No lower extremity tenderness, no upper extremity tenderness. No joint effusions, no DVT signs strong distal pulses there is right greater than left edema edema fistula in place with a good thrill and bruit on the left, Port-A-Cath present in right chest Neurologic:  Normal speech and language. No gross focal neurologic deficits are appreciated.  Skin:  Skin is warm, dry and intact. No rash noted. Psychiatric: Mood and affect are normal. Speech and behavior are normal.  ____________________________________________   LABS (all labs ordered are listed, but only abnormal results are displayed)  Labs Reviewed  BASIC METABOLIC PANEL - Abnormal; Notable for the following components:      Result Value   Chloride 99 (*)    Glucose, Bld 166 (*)    BUN 74 (*)    Creatinine, Ser 9.71 (*)    Calcium 7.5 (*)    GFR calc non Af Amer 4 (*)    GFR calc Af Amer 4 (*)    All other components within normal limits  CBC -  Abnormal; Notable for the following components:   RBC 2.58 (*)    Hemoglobin 8.5 (*)    HCT 26.9 (*)    MCV 104.3 (*)    MCHC 31.5 (*)    RDW 17.1 (*)    All other components within normal limits  TROPONIN I - Abnormal; Notable for the following components:   Troponin I 0.14 (*)    All other components within normal limits  PROTIME-INR  APTT  TYPE AND SCREEN    Pertinent labs  results that were available during my care of the patient were reviewed by me and considered in my medical decision making (see chart for details). ____________________________________________  EKG  I personally interpreted any EKGs ordered by me or triage Rate 88 bpm, no acute ST elevation or depression, normal axis, poor R wave progression, the old anterior infarct.  No acute ischemia ____________________________________________  RADIOLOGY  Pertinent labs & imaging results that were available during my care of the patient were reviewed by me and considered in my medical decision making (see chart for details). If possible, patient and/or family made aware of any abnormal findings.  Dg Chest 2 View  Result Date: 11/20/2017 CLINICAL DATA:  61 year old female with hemoptysis, bloody sputum for 2 weeks. Sent to the emergency department from the dialysis center today. EXAM: CHEST  2 VIEW COMPARISON:  10/15/2017 and earlier. FINDINGS: Seated AP and lateral views of the chest. Stable lung volumes. Stable cardiomegaly and mediastinal contours, including tortuous aorta. Chronic right chest dual lumen dialysis type catheter. Continued bilateral increased pulmonary interstitial markings, mildly confluent today in both the right suprahilar region and right lung base. Questionable small pleural effusions. No acute osseous abnormality identified. Negative visible bowel gas pattern. Visualized tracheal air column is within normal limits. IMPRESSION: 1. Continued diffuse bilateral increased pulmonary interstitial markings  thought related  to pulmonary interstitial edema. But there is increasing confluence of opacity in the right lung since November which could relate to infection or alveolar hemorrhage in the setting of hemoptysis. 2. Chronic cardiomegaly is stable. Stable right chest dialysis catheter. Electronically Signed   By: Genevie Ann M.D.   On: 11/20/2017 15:38   ____________________________________________    PROCEDURES  Procedure(s) performed: None  Procedures  Critical Care performed: None  ____________________________________________   INITIAL IMPRESSION / ASSESSMENT AND PLAN / ED COURSE  Pertinent labs & imaging results that were available during my care of the patient were reviewed by me and considered in my medical decision making (see chart for details).  Here with hemoptysis, chest x-ray shows diffuse intraparenchymal pathology which is insufficiently determined, I will obtain a CT scan to verify the cause of this hemoptysis.  Differential includes infectious, pulmonary edema from CHF and/or renal issues, as well as cancer etc.  Troponin is borderline but she has no chest pain, and she is a dialysis patient this is very difficult to interpret, no acute ischemic changes or chest pain at this time.  She has chronic right lower extremity swelling, if CT is negative we will consider also ultrasound.  Patient in no acute distress at this time hemoglobin is traveling down however with hemoptysis for a month likely will require admission  ----------------------------------------- 5:32 PM on 11/20/2017 -----------------------------------------  She remained stable here in the emergency room however, CT shows progressive infiltrate of uncertain etiology, type and screen is pending, we will treat her empirically after getting blood cultures and she will be admitted to the hospital    ____________________________________________   FINAL CLINICAL IMPRESSION(S) / ED DIAGNOSES  Final diagnoses:   None      This chart was dictated using voice recognition software.  Despite best efforts to proofread,  errors can occur which can change meaning.      Schuyler Amor, MD 11/20/17 1657    Schuyler Amor, MD 11/20/17 [phone]

## 2017-11-20 NOTE — Progress Notes (Signed)
ANTIBIOTIC CONSULT NOTE - INITIAL  Pharmacy Consult for Levaquin  Indication: pneumonia  Allergies  Allergen Reactions  . Sulfa Antibiotics Itching, Swelling, Rash and Other (See Comments)    Reaction:  Facial/body swelling     Patient Measurements: Height: 5\' 5"  (165.1 cm) Weight: 134 lb 9.6 oz (61.1 kg) IBW/kg (Calculated) : 57 Adjusted Body Weight:   Vital Signs: Temp: 98.5 F (36.9 C) (12/26 2131) Temp Source: Oral (12/26 2131) BP: 187/87 (12/26 2131) Pulse Rate: 83 (12/26 2131) Intake/Output from previous day: No intake/output data recorded. Intake/Output from this shift: Total I/O In: 250 [IV Piggyback:250] Out: -   Labs: Recent Labs    11/20/17 1515  WBC 6.7  HGB 8.5*  PLT 260  CREATININE 9.71*   Estimated Creatinine Clearance: 5.3 mL/min (A) (by C-G formula based on SCr of 9.71 mg/dL (H)). No results for input(s): VANCOTROUGH, VANCOPEAK, VANCORANDOM, GENTTROUGH, GENTPEAK, GENTRANDOM, TOBRATROUGH, TOBRAPEAK, TOBRARND, AMIKACINPEAK, AMIKACINTROU, AMIKACIN in the last 72 hours.   Microbiology: No results found for this or any previous visit (from the past 720 hour(s)).  Medical History: Past Medical History:  Diagnosis Date  . Altered mental status   . Anemia   . Apnea, sleep    for sleep study 10/17/15-no cpap yet  . Arthritis   . Bronchitis   . CHF (congestive heart failure) ([place])   . Chronic kidney disease   . COPD (chronic obstructive pulmonary disease) ([place])   . Depression   . Dialysis patient ([place]) 2014  . GERD (gastroesophageal reflux disease)   . GIB (gastrointestinal bleeding) 07/09/2016  . Headache   . Hyperlipidemia   . Hypertension   . Obesity   . Pulmonary edema 09/14/2016  . Renal dialysis device, implant, or graft complication    RIGHT CHEST CATH  . Renal insufficiency   . Respiratory failure ([place]) 07/23/2016  . Traumatic hematoma of forehead   . Type II diabetes mellitus ([place]) 09/21/2009   diet controlled    Medications:   Scheduled:  . ALPRAZolam  0.5 mg Oral BID  . amLODipine  10 mg Oral Daily  . [START ON 11/21/2017] calcium acetate  2,001 mg Oral TID WC  . [START ON 11/21/2017] carvedilol  12.5 mg Oral BID WC  . [START ON 11/21/2017] cinacalcet  60 mg Oral Q breakfast  . [START ON 11/21/2017] ezetimibe  10 mg Oral Daily  . feeding supplement (PRO-STAT SUGAR FREE 64)  30 mL Oral Daily  . [START ON 11/21/2017] furosemide  40 mg Oral Daily  . hydrALAZINE  100 mg Oral TID  . hydrochlorothiazide  25 mg Oral Daily  . losartan  100 mg Oral Daily  . montelukast  10 mg Oral QHS  . multivitamin  1 tablet Oral QHS  . pantoprazole  40 mg Oral BID  . [START ON 11/21/2017] sertraline  50 mg Oral Daily  . [START ON 11/21/2017] torsemide  100 mg Oral Daily   Assessment: CrCl = 5.3 ml/min,  Pt on HD every MWF   Goal of Therapy:  resolution of infection   Plan:  Expected duration 7 days with resolution of temperature and/or normalization of WBC   Levaquin 500 mg IV X 1 ordered to start on 12/26 @ 22:30 followed by levaquin 250 mg IV Q48H to start on 12/28.   Rockne Dearinger D 11/20/2017,10:01 PM

## 2017-11-21 ENCOUNTER — Other Ambulatory Visit: Payer: Self-pay

## 2017-11-21 LAB — BASIC METABOLIC PANEL
ANION GAP: 13 (ref 5–15)
BUN: 79 mg/dL — ABNORMAL HIGH (ref 6–20)
CALCIUM: 7.4 mg/dL — AB (ref 8.9–10.3)
CO2: 27 mmol/L (ref 22–32)
CREATININE: 9.84 mg/dL — AB (ref 0.44–1.00)
Chloride: 97 mmol/L — ABNORMAL LOW (ref 101–111)
GFR, EST AFRICAN AMERICAN: 4 mL/min — AB (ref >60)
GFR, EST NON AFRICAN AMERICAN: 4 mL/min — AB (ref >60)
Glucose, Bld: 99 mg/dL (ref 65–99)
Potassium: 5.1 mmol/L (ref 3.5–5.1)
SODIUM: 137 mmol/L (ref 135–145)

## 2017-11-21 LAB — CBC
HCT: 28.2 % — ABNORMAL LOW (ref 35.0–47.0)
Hemoglobin: 9 g/dL — ABNORMAL LOW (ref 12.0–16.0)
MCH: 33.1 pg (ref 26.0–34.0)
MCHC: 31.8 g/dL — AB (ref 32.0–36.0)
MCV: 103.9 fL — ABNORMAL HIGH (ref 80.0–100.0)
Platelets: 265 10*3/uL (ref 150–440)
RBC: 2.71 MIL/uL — ABNORMAL LOW (ref 3.80–5.20)
RDW: 17 % — AB (ref 11.5–14.5)
WBC: 7.6 10*3/uL (ref 3.6–11.0)

## 2017-11-21 NOTE — Progress Notes (Signed)
PRE HD   

## 2017-11-21 NOTE — Progress Notes (Signed)
HD start 

## 2017-11-21 NOTE — Progress Notes (Signed)
[facility], [place] 11/21/17  Subjective:   Patient presents to the hospital for acute shortness of breath and blood-tinged sputum Oxygen requirements at the time of admission was 4 L by [place].  Blood pressure elevated this morning and during dialysis today UF challenge increased to 3-3.5 L   HEMODIALYSIS FLOWSHEET:  Blood Flow Rate (mL/min): 400 mL/min Arterial Pressure (mmHg): -170 mmHg Venous Pressure (mmHg): 140 mmHg Transmembrane Pressure (mmHg): 70 mmHg Ultrafiltration Rate (mL/min): 1160 mL/min Dialysate Flow Rate (mL/min): 800 ml/min Conductivity: Machine : 13.8 Conductivity: Machine : 13.8 Dialysis Fluid Bolus: Normal Saline Bolus Amount (mL): 250 mL  Patient seen during dialysis Tolerating well    Objective:  Vital signs in last 24 hours:  Temp:  [97.6 F (36.4 C)-98.8 F (37.1 C)] 98.2 F (36.8 C) (12/27 1616) Pulse Rate:  [71-102] 100 (12/27 1616) Resp:  [14-26] 22 (12/27 1616) BP: (117-194)/(57-118) 117/57 (12/27 1616) SpO2:  [87 %-100 %] 100 % (12/27 1616) Weight:  [61.1 kg (134 lb 9.6 oz)-63.2 kg (139 lb 5.3 oz)] 63.2 kg (139 lb 5.3 oz) (12/27 1020)  Weight change:  Filed Weights   11/20/17 1511 11/20/17 2139 11/21/17 1020  Weight: 59 kg (130 lb) 61.1 kg (134 lb 9.6 oz) 63.2 kg (139 lb 5.3 oz)    Intake/Output:    Intake/Output Summary (Last 24 hours) at 11/21/2017 1651 Last data filed at 11/21/2017 1345 [place] per 24 hour  Intake 730 ml  Output 2000 ml  Net -1270 ml     Physical Exam: General: No acute distress, laying in the bed  HEENT Anicteric, moist oral mucous membranes  Neck supple  Pulm/lungs Diffuse crackles at bases, scattered rhonchi  CVS/Heart tachycardic  Abdomen:  Soft, nontender  Extremities: Trace edema  Neurologic: Alert, oriented  Skin: No acute rashes  Access: IJ PermCath AV fistula       Basic Metabolic Panel:  Recent Labs  Lab 11/20/17 1515 11/21/17 0509  NA 139 137  K 4.6 5.1   CL 99* 97*  CO2 29 27  GLUCOSE 166* 99  BUN 74* 79*  CREATININE 9.71* 9.84*  CALCIUM 7.5* 7.4*     CBC: Recent Labs  Lab 11/20/17 1515 11/21/17 0509  WBC 6.7 7.6  HGB 8.5* 9.0*  HCT 26.9* 28.2*  MCV 104.3* 103.9*  PLT 260 265      Lab Results  Component Value Date   HEPBSAG Negative 10/06/2017   HEPBSAB Reactive 10/06/2017      Microbiology:  Recent Results (from the past 240 hour(s))  Culture, blood (routine x 2)     Status: None (Preliminary result)   Collection Time: 11/20/17  5:34 PM  Result Value Ref Range Status   Specimen Description BLOOD RIGHT ANTECUBITAL  Final   Special Requests   Final    BOTTLES DRAWN AEROBIC AND ANAEROBIC Blood Culture adequate volume   Culture   Final    NO GROWTH < 24 HOURS Performed at [facility], [address] [place], [place] [address]    Report Status PENDING  Incomplete  Culture, blood (routine x 2)     Status: None (Preliminary result)   Collection Time: 11/20/17  5:34 PM  Result Value Ref Range Status   Specimen Description BLOOD BLOOD RIGHT ARM  Final   Special Requests   Final    BOTTLES DRAWN AEROBIC AND ANAEROBIC Blood Culture adequate volume   Culture   Final    NO GROWTH < 24 HOURS Performed at [facility], [address], [place], [place] [address]    Report Status PENDING  Incomplete  MRSA PCR Screening     Status: None   Collection Time: 11/20/17  9:52 PM  Result Value Ref Range Status   MRSA by PCR NEGATIVE NEGATIVE Final    Comment:        The GeneXpert MRSA Assay (FDA approved for NASAL specimens only), is one component of a comprehensive MRSA colonization surveillance program. It is not intended to diagnose MRSA infection nor to guide or monitor treatment for MRSA infections. Performed at [facility], [address] [place], [place] [address]     Coagulation Studies: Recent Labs    11/20/17 1637  LABPROT 13.2  INR 1.01    Urinalysis: No results  for input(s): COLORURINE, LABSPEC, PHURINE, GLUCOSEU, HGBUR, BILIRUBINUR, KETONESUR, PROTEINUR, UROBILINOGEN, NITRITE, LEUKOCYTESUR in the last 72 hours.  Invalid input(s): APPERANCEUR    Imaging: Dg Chest 2 View  Result Date: 11/20/2017 CLINICAL DATA:  61 year old female with hemoptysis, bloody sputum for 2 weeks. Sent to the emergency department from the dialysis center today. EXAM: CHEST  2 VIEW COMPARISON:  10/15/2017 and earlier. FINDINGS: Seated AP and lateral views of the chest. Stable lung volumes. Stable cardiomegaly and mediastinal contours, including tortuous aorta. Chronic right chest dual lumen dialysis type catheter. Continued bilateral increased pulmonary interstitial markings, mildly confluent today in both the right suprahilar region and right lung base. Questionable small pleural effusions. No acute osseous abnormality identified. Negative visible bowel gas pattern. Visualized tracheal air column is within normal limits. IMPRESSION: 1. Continued diffuse bilateral increased pulmonary interstitial markings thought related to pulmonary interstitial edema. But there is increasing confluence of opacity in the right lung since November which could relate to infection or alveolar hemorrhage in the setting of hemoptysis. 2. Chronic cardiomegaly is stable. Stable right chest dialysis catheter. Electronically Signed   By: Genevie Ann M.D.   On: 11/20/2017 15:38   Ct Angio Chest Pe W And/or Wo Contrast  Result Date: 11/20/2017 CLINICAL DATA:  Hemoptysis for several weeks. Chronic shortness of breath. Dialysis patient. Chronic right lower extremity swelling. EXAM: CT ANGIOGRAPHY CHEST WITH CONTRAST TECHNIQUE: Multidetector CT imaging of the chest was performed using the standard protocol during bolus administration of intravenous contrast. Multiplanar CT image reconstructions and MIPs were obtained to evaluate the vascular anatomy. CONTRAST:  44mL ISOVUE-370 IOPAMIDOL (ISOVUE-370) INJECTION 76%  COMPARISON:  Chest CT dated 06/18/2017.  Chest CT dated 12/04/2016. FINDINGS: Cardiovascular: Some of the most peripheral segmental and subsegmental pulmonary artery branches cannot be definitively characterized due to patient breathing motion artifact, however, there is no pulmonary embolism identified within the main, lobar or central segmental pulmonary arteries bilaterally. Main pulmonary artery measures 4 cm diameter suggesting the possibility of pulmonary artery hypertension. No thoracic aortic aneurysm or dissection. Aortic atherosclerosis. Cardiomegaly appears stable. No pericardial effusion. Mediastinum/Nodes: Scattered small lymph nodes within the mediastinum and perihilar regions. No enlarged or morphologically abnormal lymph nodes identified. Esophagus is somewhat patulous but otherwise unremarkable. Trachea and central bronchi are unremarkable. Lungs/Pleura: Increasingly dense reticular and airspace opacities within the bilateral upper lobes, now also involving the superior segments of the lower lobes bilaterally, predominantly peripheral. Additional mild bibasilar atelectasis. No pneumothorax or pleural effusion seen. Upper Abdomen: Limited images of the upper abdomen are unremarkable. Bilateral renal cysts, incompletely imaged. Musculoskeletal: Osseous structures diffusely sclerotic, compatible with previously described renal osteodystrophy. Review of the MIP images confirms the above findings. IMPRESSION: 1. Increasingly dense reticular  and alveolar opacities within the upper lobes bilaterally, peripheral distribution, also involving the superior segments of the lower lobes bilaterally. These portions of the lungs were clear on earlier study of 12/04/2016 indicating a significant progression of disease. Differential includes bacteria pneumonia, pulmonary edema or pulmonary hemorrhage, eosinophilic pneumonia and interstitial pneumonia. 2. No pulmonary embolism seen. 3. Prominent size of the main  pulmonary artery raising the possibility of pulmonary artery hypertension. 4. Aortic atherosclerosis. 5. Osseous structures are diffusely sclerotic, compatible with renal osteodystrophy. Electronically Signed   By: Franki Cabot M.D.   On: 11/20/2017 17:25     Medications:   . [START ON 11/22/2017] levofloxacin (LEVAQUIN) IV     . ALPRAZolam  0.5 mg Oral BID  . amLODipine  10 mg Oral Daily  . calcium acetate  2,001 mg Oral TID WC  . carvedilol  12.5 mg Oral BID WC  . cinacalcet  60 mg Oral Q breakfast  . ezetimibe  10 mg Oral Daily  . feeding supplement (PRO-STAT SUGAR FREE 64)  30 mL Oral Daily  . furosemide  40 mg Oral Daily  . hydrALAZINE  100 mg Oral TID  . hydrochlorothiazide  25 mg Oral Daily  . losartan  100 mg Oral Daily  . montelukast  10 mg Oral QHS  . multivitamin  1 tablet Oral QHS  . pantoprazole  40 mg Oral BID  . sertraline  50 mg Oral Daily  . torsemide  100 mg Oral Daily   acetaminophen **OR** acetaminophen, diphenhydrAMINE, fluticasone, ibuprofen, ipratropium-albuterol, lidocaine-prilocaine, ondansetron, senna-docusate, traMADol  Assessment/ Plan:  61 y.o. female with end stage renal disease on hemodialysis, hypertension, congestive heart failure, COPD/tobacco abuse, diabetes mellitus type II, depression, obstructive sleep apnea, peripheral vascular disease,  MWF [place].   1.  End-stage renal disease 2.  Anemia of chronic kidney disease 3.  Secondary hyperparathyroidism 4.  Hemopytsis   Hemodialysis today Fluid removal as tolerated Procrit with hemodialysis once blood pressure control improves Continue home dose of calcium acetate    LOS: [place] 12/27/20184:51 PM  [place], [place]

## 2017-11-21 NOTE — Progress Notes (Signed)
[facility] NAME: Michele Nash    MR#:  [id number]  DATE OF BIRTH:  July 22, 1956  SUBJECTIVE:  CHIEF COMPLAINT:   Chief Complaint  Patient presents with  . Hemoptysis  . Leg Swelling   Some blood continues while she coughs.  No worsening.  No shortness of breath.  Had hemodialysis earlier today.  REVIEW OF SYSTEMS:    Review of Systems  Constitutional: Negative for chills and fever.  HENT: Negative for sore throat.   Eyes: Negative for blurred vision, double vision and pain.  Respiratory: Positive for hemoptysis. Negative for cough, shortness of breath and wheezing.   Cardiovascular: Negative for chest pain, palpitations, orthopnea and leg swelling.  Gastrointestinal: Negative for abdominal pain, constipation, diarrhea, heartburn, nausea and vomiting.  Genitourinary: Negative for dysuria and hematuria.  Musculoskeletal: Negative for back pain and joint pain.  Skin: Negative for rash.  Neurological: Negative for sensory change, speech change, focal weakness and headaches.  Endo/Heme/Allergies: Does not bruise/bleed easily.  Psychiatric/Behavioral: Negative for depression. The patient is not nervous/anxious.     DRUG ALLERGIES:   Allergies  Allergen Reactions  . Sulfa Antibiotics Itching, Swelling, Rash and Other (See Comments)    Reaction:  Facial/body swelling     VITALS:  Blood pressure (!) 156/117, pulse 97, temperature 98.8 F (37.1 C), temperature source Oral, resp. rate 20, height 5\' 5"  (1.651 m), weight 63.2 kg (139 lb 5.3 oz), SpO2 100 %.  PHYSICAL EXAMINATION:   Physical Exam  GENERAL:  61 y.o.-year-old patient lying in the bed with no acute distress.  EYES: Pupils equal, round, reactive to light and accommodation. No scleral icterus. Extraocular muscles intact.  HEENT: Head atraumatic, normocephalic. Oropharynx and nasopharynx clear.  NECK:  Supple, no jugular venous distention. No thyroid enlargement, no  tenderness.  LUNGS: Normal breath sounds bilaterally, no wheezing, rales, rhonchi. No use of accessory muscles of respiration.  CARDIOVASCULAR: S1, S2 normal. No murmurs, rubs, or gallops.  ABDOMEN: Soft, nontender, nondistended. Bowel sounds present. No organomegaly or mass.  EXTREMITIES: No cyanosis, clubbing or edema b/l.    NEUROLOGIC: Cranial nerves II through XII are intact. No focal Motor or sensory deficits b/l.   PSYCHIATRIC: The patient is alert and oriented x 3.  SKIN: No obvious rash, lesion, or ulcer.   LABORATORY PANEL:   CBC Recent Labs  Lab 11/21/17 0509  WBC 7.6  HGB 9.0*  HCT 28.2*  PLT 265   ------------------------------------------------------------------------------------------------------------------ Chemistries  Recent Labs  Lab 11/21/17 0509  NA 137  K 5.1  CL 97*  CO2 27  GLUCOSE 99  BUN 79*  CREATININE 9.84*  CALCIUM 7.4*   ------------------------------------------------------------------------------------------------------------------  Cardiac Enzymes Recent Labs  Lab 11/20/17 1515  TROPONINI 0.14*   ------------------------------------------------------------------------------------------------------------------  RADIOLOGY:  Dg Chest 2 View  Result Date: 11/20/2017 CLINICAL DATA:  61 year old female with hemoptysis, bloody sputum for 2 weeks. Sent to the emergency department from the dialysis center today. EXAM: CHEST  2 VIEW COMPARISON:  10/15/2017 and earlier. FINDINGS: Seated AP and lateral views of the chest. Stable lung volumes. Stable cardiomegaly and mediastinal contours, including tortuous aorta. Chronic right chest dual lumen dialysis type catheter. Continued bilateral increased pulmonary interstitial markings, mildly confluent today in both the right suprahilar region and right lung base. Questionable small pleural effusions. No acute osseous abnormality identified. Negative visible bowel gas pattern. Visualized tracheal air  column is within normal limits. IMPRESSION: 1. Continued diffuse bilateral increased pulmonary interstitial markings thought  related to pulmonary interstitial edema. But there is increasing confluence of opacity in the right lung since November which could relate to infection or alveolar hemorrhage in the setting of hemoptysis. 2. Chronic cardiomegaly is stable. Stable right chest dialysis catheter. Electronically Signed   By: Genevie Ann M.D.   On: 11/20/2017 15:38   Ct Angio Chest Pe W And/or Wo Contrast  Result Date: 11/20/2017 CLINICAL DATA:  Hemoptysis for several weeks. Chronic shortness of breath. Dialysis patient. Chronic right lower extremity swelling. EXAM: CT ANGIOGRAPHY CHEST WITH CONTRAST TECHNIQUE: Multidetector CT imaging of the chest was performed using the standard protocol during bolus administration of intravenous contrast. Multiplanar CT image reconstructions and MIPs were obtained to evaluate the vascular anatomy. CONTRAST:  49mL ISOVUE-370 IOPAMIDOL (ISOVUE-370) INJECTION 76% COMPARISON:  Chest CT dated 06/18/2017.  Chest CT dated 12/04/2016. FINDINGS: Cardiovascular: Some of the most peripheral segmental and subsegmental pulmonary artery branches cannot be definitively characterized due to patient breathing motion artifact, however, there is no pulmonary embolism identified within the main, lobar or central segmental pulmonary arteries bilaterally. Main pulmonary artery measures 4 cm diameter suggesting the possibility of pulmonary artery hypertension. No thoracic aortic aneurysm or dissection. Aortic atherosclerosis. Cardiomegaly appears stable. No pericardial effusion. Mediastinum/Nodes: Scattered small lymph nodes within the mediastinum and perihilar regions. No enlarged or morphologically abnormal lymph nodes identified. Esophagus is somewhat patulous but otherwise unremarkable. Trachea and central bronchi are unremarkable. Lungs/Pleura: Increasingly dense reticular and airspace opacities  within the bilateral upper lobes, now also involving the superior segments of the lower lobes bilaterally, predominantly peripheral. Additional mild bibasilar atelectasis. No pneumothorax or pleural effusion seen. Upper Abdomen: Limited images of the upper abdomen are unremarkable. Bilateral renal cysts, incompletely imaged. Musculoskeletal: Osseous structures diffusely sclerotic, compatible with previously described renal osteodystrophy. Review of the MIP images confirms the above findings. IMPRESSION: 1. Increasingly dense reticular and alveolar opacities within the upper lobes bilaterally, peripheral distribution, also involving the superior segments of the lower lobes bilaterally. These portions of the lungs were clear on earlier study of 12/04/2016 indicating a significant progression of disease. Differential includes bacteria pneumonia, pulmonary edema or pulmonary hemorrhage, eosinophilic pneumonia and interstitial pneumonia. 2. No pulmonary embolism seen. 3. Prominent size of the main pulmonary artery raising the possibility of pulmonary artery hypertension. 4. Aortic atherosclerosis. 5. Osseous structures are diffusely sclerotic, compatible with renal osteodystrophy. Electronically Signed   By: Franki Cabot M.D.   On: 11/20/2017 17:25     ASSESSMENT AND PLAN:   61 year old female with history of end-stage renal disease on hemodialysis, chronic diastolic heart failure and chronic respiratory failure due to COPD on 4 L of oxygen who presents with hemoptysis.  *Mild hemoptysis likely due to bronchitis over patient's interstitial lung disease.  No wheezing today.  Continue Levaquin.  Discussed with Dr. Jamal Collin of pulmonary. Likely discharge tomorrow if hemoptysis improves for outpatient follow-up.  * Acute on chronic anemia from chronic disease Follow CBC  * End-stage renal disease on hemodialysis Nephrology consulted Continue HD  * Chronic hypoxic respiratory failure due to COPD without  signs of exacerbation Continue nebs and inhalers  * Essential hypertension Continue Norvasc, Coreg, hydralazine, HCTZ, losartan  * Chronic diastolic heart failure without signs of exacerbation Continue Lasix  All the records are reviewed and case discussed with Care Management/Social Worker Management plans discussed with the patient, family and they are in agreement.  CODE STATUS: FULL CODE  DVT Prophylaxis: SCDs  TOTAL TIME TAKING CARE OF THIS PATIENT: 35 minutes.  POSSIBLE D/C IN 1-2 DAYS, DEPENDING ON CLINICAL CONDITION.  Leia Alf Shavaun Osterloh M.D on 11/21/2017 at 3:54 PM  Between 7am to 6pm - Pager - [phone]  After 6pm go to [email or web address] - password [email or web address] [facility]  Office  [phone]  CC: Primary care physician; [name], MD  Note: This dictation was prepared with Dragon dictation along with smaller phrase technology. Any transcriptional errors that result from this process are unintentional.

## 2017-11-21 NOTE — Progress Notes (Signed)
Pre HD  

## 2017-11-21 NOTE — Progress Notes (Signed)
Post HD  

## 2017-11-22 DIAGNOSIS — J449 Chronic obstructive pulmonary disease, unspecified: Secondary | ICD-10-CM

## 2017-11-22 DIAGNOSIS — Z87891 Personal history of nicotine dependence: Secondary | ICD-10-CM

## 2017-11-22 DIAGNOSIS — J9611 Chronic respiratory failure with hypoxia: Secondary | ICD-10-CM

## 2017-11-22 DIAGNOSIS — R042 Hemoptysis: Secondary | ICD-10-CM

## 2017-11-22 LAB — BLOOD GAS, ARTERIAL
ACID-BASE EXCESS: 5 mmol/L — AB (ref 0.0–2.0)
Bicarbonate: 32.5 mmol/L — ABNORMAL HIGH (ref 20.0–28.0)
FIO2: 0.36
O2 Saturation: 92.2 %
PCO2 ART: 66 mmHg — AB (ref 32.0–48.0)
PH ART: 7.3 — AB (ref 7.350–7.450)
Patient temperature: 37
pO2, Arterial: 71 mmHg — ABNORMAL LOW (ref 83.0–108.0)

## 2017-11-22 LAB — CBC
HCT: 28.1 % — ABNORMAL LOW (ref 35.0–47.0)
Hemoglobin: 8.8 g/dL — ABNORMAL LOW (ref 12.0–16.0)
MCH: 32.8 pg (ref 26.0–34.0)
MCHC: 31.3 g/dL — ABNORMAL LOW (ref 32.0–36.0)
MCV: 104.7 fL — ABNORMAL HIGH (ref 80.0–100.0)
PLATELETS: 262 10*3/uL (ref 150–440)
RBC: 2.68 MIL/uL — ABNORMAL LOW (ref 3.80–5.20)
RDW: 16.7 % — AB (ref 11.5–14.5)
WBC: 6.7 10*3/uL (ref 3.6–11.0)

## 2017-11-22 LAB — AMMONIA: Ammonia: 21 umol/L (ref 9–35)

## 2017-11-22 LAB — GLUCOSE, CAPILLARY: GLUCOSE-CAPILLARY: 95 mg/dL (ref 65–99)

## 2017-11-22 MED ORDER — PREDNISONE 20 MG PO TABS
40.0000 mg | ORAL_TABLET | Freq: Every day | ORAL | Status: DC
  Administered 2017-11-23: 40 mg via ORAL
  Filled 2017-11-22: qty 2

## 2017-11-22 NOTE — Progress Notes (Addendum)
[place] [facility] [place] NAME: Michele Nash    MR#:  [id number]  DATE OF BIRTH:  1956/11/10  SUBJECTIVE:   Patient seen in dialysis. Patient is still spitting up blood  REVIEW OF SYSTEMS:    Review of Systems  Constitutional: Negative for fever, chills weight loss HENT: Negative for ear pain, nosebleeds, congestion, facial swelling, rhinorrhea, neck pain, neck stiffness and ear discharge.   Respiratory: Negative for cough, shortness of breath, wheezing  Positive hemoptysis Cardiovascular: Negative for chest pain, palpitations and leg swelling.  Gastrointestinal: Negative for heartburn, abdominal pain, vomiting, diarrhea or consitpation Genitourinary: Negative for dysuria, urgency, frequency, hematuria Musculoskeletal: Negative for back pain or joint pain Neurological: Negative for dizziness, seizures, syncope, focal weakness,  numbness and headaches.  Hematological: Does not bruise/bleed easily.  Psychiatric/Behavioral: Negative for hallucinations, confusion, dysphoric mood    Tolerating Diet: yes      DRUG ALLERGIES:   Allergies  Allergen Reactions  . Sulfa Antibiotics Itching, Swelling, Rash and Other (See Comments)    Reaction:  Facial/body swelling     VITALS:  Blood pressure 134/78, pulse 79, temperature 99.5 F (37.5 C), temperature source Oral, resp. rate (!) 21, height 5\' 5"  (1.651 m), weight 61.6 kg (135 lb 12.9 oz), SpO2 99 %.  PHYSICAL EXAMINATION:  Constitutional: Appears well-developed and well-nourished. No distress. HENT: Normocephalic. Marland Kitchen Oropharynx is clear and moist.  Eyes: Conjunctivae and EOM are normal. PERRLA, no scleral icterus.  Neck: Normal ROM. Neck supple. No JVD. No tracheal deviation. CVS: RRR, S1/S2 +, no murmurs, no gallops, no carotid bruit.  Pulmonary: Effort and breath sounds normal, no stridor, rhonchi, wheezes, rales.  Abdominal: Soft. BS +,  no distension, tenderness, rebound or guarding.   Musculoskeletal: Normal range of motion. No edema and no tenderness.  Neuro: Alert. CN 2-12 grossly intact. No focal deficits. Skin: Skin is warm and dry. No rash noted. Psychiatric: Normal mood and affect.      LABORATORY PANEL:   CBC Recent Labs  Lab 11/21/17 0509  WBC 7.6  HGB 9.0*  HCT 28.2*  PLT 265   ------------------------------------------------------------------------------------------------------------------  Chemistries  Recent Labs  Lab 11/21/17 0509  NA 137  K 5.1  CL 97*  CO2 27  GLUCOSE 99  BUN 79*  CREATININE 9.84*  CALCIUM 7.4*   ------------------------------------------------------------------------------------------------------------------  Cardiac Enzymes Recent Labs  Lab 11/20/17 1515  TROPONINI 0.14*   ------------------------------------------------------------------------------------------------------------------  RADIOLOGY:  Dg Chest 2 View  Result Date: 11/20/2017 CLINICAL DATA:  61 year old female with hemoptysis, bloody sputum for 2 weeks. Sent to the emergency department from the dialysis center today. EXAM: CHEST  2 VIEW COMPARISON:  10/15/2017 and earlier. FINDINGS: Seated AP and lateral views of the chest. Stable lung volumes. Stable cardiomegaly and mediastinal contours, including tortuous aorta. Chronic right chest dual lumen dialysis type catheter. Continued bilateral increased pulmonary interstitial markings, mildly confluent today in both the right suprahilar region and right lung base. Questionable small pleural effusions. No acute osseous abnormality identified. Negative visible bowel gas pattern. Visualized tracheal air column is within normal limits. IMPRESSION: 1. Continued diffuse bilateral increased pulmonary interstitial markings thought related to pulmonary interstitial edema. But there is increasing confluence of opacity in the right lung since November which could relate to infection or alveolar hemorrhage in the  setting of hemoptysis. 2. Chronic cardiomegaly is stable. Stable right chest dialysis catheter. Electronically Signed   By: Genevie Ann M.D.   On: 11/20/2017 15:38   Ct Angio  Chest Pe W And/or Wo Contrast  Result Date: 11/20/2017 CLINICAL DATA:  Hemoptysis for several weeks. Chronic shortness of breath. Dialysis patient. Chronic right lower extremity swelling. EXAM: CT ANGIOGRAPHY CHEST WITH CONTRAST TECHNIQUE: Multidetector CT imaging of the chest was performed using the standard protocol during bolus administration of intravenous contrast. Multiplanar CT image reconstructions and MIPs were obtained to evaluate the vascular anatomy. CONTRAST:  5mL ISOVUE-370 IOPAMIDOL (ISOVUE-370) INJECTION 76% COMPARISON:  Chest CT dated 06/18/2017.  Chest CT dated 12/04/2016. FINDINGS: Cardiovascular: Some of the most peripheral segmental and subsegmental pulmonary artery branches cannot be definitively characterized due to patient breathing motion artifact, however, there is no pulmonary embolism identified within the main, lobar or central segmental pulmonary arteries bilaterally. Main pulmonary artery measures 4 cm diameter suggesting the possibility of pulmonary artery hypertension. No thoracic aortic aneurysm or dissection. Aortic atherosclerosis. Cardiomegaly appears stable. No pericardial effusion. Mediastinum/Nodes: Scattered small lymph nodes within the mediastinum and perihilar regions. No enlarged or morphologically abnormal lymph nodes identified. Esophagus is somewhat patulous but otherwise unremarkable. Trachea and central bronchi are unremarkable. Lungs/Pleura: Increasingly dense reticular and airspace opacities within the bilateral upper lobes, now also involving the superior segments of the lower lobes bilaterally, predominantly peripheral. Additional mild bibasilar atelectasis. No pneumothorax or pleural effusion seen. Upper Abdomen: Limited images of the upper abdomen are unremarkable. Bilateral renal cysts,  incompletely imaged. Musculoskeletal: Osseous structures diffusely sclerotic, compatible with previously described renal osteodystrophy. Review of the MIP images confirms the above findings. IMPRESSION: 1. Increasingly dense reticular and alveolar opacities within the upper lobes bilaterally, peripheral distribution, also involving the superior segments of the lower lobes bilaterally. These portions of the lungs were clear on earlier study of 12/04/2016 indicating a significant progression of disease. Differential includes bacteria pneumonia, pulmonary edema or pulmonary hemorrhage, eosinophilic pneumonia and interstitial pneumonia. 2. No pulmonary embolism seen. 3. Prominent size of the main pulmonary artery raising the possibility of pulmonary artery hypertension. 4. Aortic atherosclerosis. 5. Osseous structures are diffusely sclerotic, compatible with renal osteodystrophy. Electronically Signed   By: Franki Cabot M.D.   On: 11/20/2017 17:25     ASSESSMENT AND PLAN:   61 year old female with history of end-stage renal disease on hemodialysis, chronic diastolic heart failure and chronic respiratory failure due to COPD on 4 L of oxygen who presents with hemoptysis.  1. Hemoptysis: CT scan does not show evidence of pulmonary emboli however it is concerning for increasing dense reticular and alveolar opacities within the upper lobes concerning for possible pulmonary hemorrhage versus interstitial lung disease Discussed with Dr Alva Garnet who will see patient today. Continue Levaquin empirically for treatment of pneumonia hgb stable  2. Acute on chronic anemia from chronic disease Hgb stable  3. End-stage renal disease on hemodialysis:Continue HD   4. Chronic hypoxic respiratory failure due to COPD without signs of exacerbation Continue nebs and inhalers  5. Essential hypertension: Continue Norvasc, Coreg, hydralazine, HCTZ, losartan  6. Chronic diastolic heart failure without signs of  exacerbation: Continue Torsemide           Management plans discussed with the patient and she is in agreement.  CODE STATUS: full  TOTAL TIME TAKING CARE OF THIS PATIENT: 23 minutes.     POSSIBLE D/C tomorrow, DEPENDING ON CLINICAL CONDITION.   Bland Rudzinski M.D on 11/22/2017 at 12:37 PM  Between 7am to 6pm - Pager - [phone] After 6pm go to [email or web address] - password [email or web address]  Office  [phone]  CC: Primary care physician;  [name], MD  Note: This dictation was prepared with Dragon dictation along with smaller phrase technology. Any transcriptional errors that result from this process are unintentional.

## 2017-11-22 NOTE — Progress Notes (Signed)
[facility], [place] 11/22/17  Subjective:   Patient presents to the hospital for acute shortness of breath and blood-tinged sputum Oxygen requirements at the time of admission was 4 L by [place].     HEMODIALYSIS FLOWSHEET:  Blood Flow Rate (mL/min): 380 mL/min Arterial Pressure (mmHg): -170 mmHg Venous Pressure (mmHg): 120 mmHg Transmembrane Pressure (mmHg): 50 mmHg Ultrafiltration Rate (mL/min): 170 mL/min Dialysate Flow Rate (mL/min): 800 ml/min Conductivity: Machine : 14 Conductivity: Machine : 14 Dialysis Fluid Bolus: Normal Saline Bolus Amount (mL): 250 mL  Patient seen during dialysis Tolerating well    Objective:  Vital signs in last 24 hours:  Temp:  [98.2 F (36.8 C)-99.5 F (37.5 C)] 99.5 F (37.5 C) (12/28 0953) Pulse Rate:  [71-102] 79 (12/28 1100) Resp:  [14-24] 21 (12/28 1100) BP: (117-188)/(57-117) 134/78 (12/28 1045) SpO2:  [96 %-100 %] 99 % (12/28 1145) Weight:  [61.6 kg (135 lb 12.9 oz)] 61.6 kg (135 lb 12.9 oz) (12/28 0945)  Weight change: 4.232 kg (9 lb 5.3 oz) Filed Weights   11/20/17 2139 11/21/17 1020 11/22/17 0945  Weight: 61.1 kg (134 lb 9.6 oz) 63.2 kg (139 lb 5.3 oz) 61.6 kg (135 lb 12.9 oz)    Intake/Output:    Intake/Output Summary (Last 24 hours) at 11/22/2017 1217 Last data filed at 11/21/2017 1900 Gross per 24 hour  Intake 0 ml  Output 2000 ml  Net -2000 ml     Physical Exam: General: No acute distress, laying in the bed  HEENT Anicteric, moist oral mucous membranes, periorbital edema  Neck supple  Pulm/lungs   scattered rhonchi, no crackles  CVS/Heart tachycardic  Abdomen:  Soft, nontender  Extremities: Trace edema  Neurologic: Alert, oriented  Skin: No acute rashes  Access: IJ PermCath AV fistula       Basic Metabolic Panel:  Recent Labs  Lab 11/20/17 1515 11/21/17 0509  NA 139 137  K 4.6 5.1  CL 99* 97*  CO2 29 27  GLUCOSE 166* 99  BUN 74* 79*  CREATININE 9.71* 9.84*  CALCIUM  7.5* 7.4*     CBC: Recent Labs  Lab 11/20/17 1515 11/21/17 0509  WBC 6.7 7.6  HGB 8.5* 9.0*  HCT 26.9* 28.2*  MCV 104.3* 103.9*  PLT 260 265      Lab Results  Component Value Date   HEPBSAG Negative 10/06/2017   HEPBSAB Reactive 10/06/2017      Microbiology:  Recent Results (from the past 240 hour(s))  Culture, blood (routine x 2)     Status: None (Preliminary result)   Collection Time: 11/20/17  5:34 PM  Result Value Ref Range Status   Specimen Description BLOOD RIGHT ANTECUBITAL  Final   Special Requests   Final    BOTTLES DRAWN AEROBIC AND ANAEROBIC Blood Culture adequate volume   Culture   Final    NO GROWTH 2 DAYS Performed at [facility], [address] [place], [place] [address]    Report Status PENDING  Incomplete  Culture, blood (routine x 2)     Status: None (Preliminary result)   Collection Time: 11/20/17  5:34 PM  Result Value Ref Range Status   Specimen Description BLOOD BLOOD RIGHT ARM  Final   Special Requests   Final    BOTTLES DRAWN AEROBIC AND ANAEROBIC Blood Culture adequate volume   Culture   Final    NO GROWTH 2 DAYS Performed at [facility], [address] [place], [place] [address]  Report Status PENDING  Incomplete  MRSA PCR Screening     Status: None   Collection Time: 11/20/17  9:52 PM  Result Value Ref Range Status   MRSA by PCR NEGATIVE NEGATIVE Final    Comment:        The GeneXpert MRSA Assay (FDA approved for NASAL specimens only), is one component of a comprehensive MRSA colonization surveillance program. It is not intended to diagnose MRSA infection nor to guide or monitor treatment for MRSA infections. Performed at [facility], [address] [place], [place] [address]     Coagulation Studies: Recent Labs    11/20/17 1637  LABPROT 13.2  INR 1.01    Urinalysis: No results for input(s): COLORURINE, LABSPEC, PHURINE, GLUCOSEU, HGBUR, BILIRUBINUR, KETONESUR, PROTEINUR,  UROBILINOGEN, NITRITE, LEUKOCYTESUR in the last 72 hours.  Invalid input(s): APPERANCEUR    Imaging: Dg Chest 2 View  Result Date: 11/20/2017 CLINICAL DATA:  61 year old female with hemoptysis, bloody sputum for 2 weeks. Sent to the emergency department from the dialysis center today. EXAM: CHEST  2 VIEW COMPARISON:  10/15/2017 and earlier. FINDINGS: Seated AP and lateral views of the chest. Stable lung volumes. Stable cardiomegaly and mediastinal contours, including tortuous aorta. Chronic right chest dual lumen dialysis type catheter. Continued bilateral increased pulmonary interstitial markings, mildly confluent today in both the right suprahilar region and right lung base. Questionable small pleural effusions. No acute osseous abnormality identified. Negative visible bowel gas pattern. Visualized tracheal air column is within normal limits. IMPRESSION: 1. Continued diffuse bilateral increased pulmonary interstitial markings thought related to pulmonary interstitial edema. But there is increasing confluence of opacity in the right lung since November which could relate to infection or alveolar hemorrhage in the setting of hemoptysis. 2. Chronic cardiomegaly is stable. Stable right chest dialysis catheter. Electronically Signed   By: Genevie Ann M.D.   On: 11/20/2017 15:38   Ct Angio Chest Pe W And/or Wo Contrast  Result Date: 11/20/2017 CLINICAL DATA:  Hemoptysis for several weeks. Chronic shortness of breath. Dialysis patient. Chronic right lower extremity swelling. EXAM: CT ANGIOGRAPHY CHEST WITH CONTRAST TECHNIQUE: Multidetector CT imaging of the chest was performed using the standard protocol during bolus administration of intravenous contrast. Multiplanar CT image reconstructions and MIPs were obtained to evaluate the vascular anatomy. CONTRAST:  35mL ISOVUE-370 IOPAMIDOL (ISOVUE-370) INJECTION 76% COMPARISON:  Chest CT dated 06/18/2017.  Chest CT dated 12/04/2016. FINDINGS: Cardiovascular: Some of  the most peripheral segmental and subsegmental pulmonary artery branches cannot be definitively characterized due to patient breathing motion artifact, however, there is no pulmonary embolism identified within the main, lobar or central segmental pulmonary arteries bilaterally. Main pulmonary artery measures 4 cm diameter suggesting the possibility of pulmonary artery hypertension. No thoracic aortic aneurysm or dissection. Aortic atherosclerosis. Cardiomegaly appears stable. No pericardial effusion. Mediastinum/Nodes: Scattered small lymph nodes within the mediastinum and perihilar regions. No enlarged or morphologically abnormal lymph nodes identified. Esophagus is somewhat patulous but otherwise unremarkable. Trachea and central bronchi are unremarkable. Lungs/Pleura: Increasingly dense reticular and airspace opacities within the bilateral upper lobes, now also involving the superior segments of the lower lobes bilaterally, predominantly peripheral. Additional mild bibasilar atelectasis. No pneumothorax or pleural effusion seen. Upper Abdomen: Limited images of the upper abdomen are unremarkable. Bilateral renal cysts, incompletely imaged. Musculoskeletal: Osseous structures diffusely sclerotic, compatible with previously described renal osteodystrophy. Review of the MIP images confirms the above findings. IMPRESSION: 1. Increasingly dense reticular and alveolar opacities within the upper lobes bilaterally, peripheral distribution, also involving  the superior segments of the lower lobes bilaterally. These portions of the lungs were clear on earlier study of 12/04/2016 indicating a significant progression of disease. Differential includes bacteria pneumonia, pulmonary edema or pulmonary hemorrhage, eosinophilic pneumonia and interstitial pneumonia. 2. No pulmonary embolism seen. 3. Prominent size of the main pulmonary artery raising the possibility of pulmonary artery hypertension. 4. Aortic atherosclerosis. 5.  Osseous structures are diffusely sclerotic, compatible with renal osteodystrophy. Electronically Signed   By: Franki Cabot M.D.   On: 11/20/2017 17:25     Medications:   . levofloxacin (LEVAQUIN) IV     . ALPRAZolam  0.5 mg Oral BID  . amLODipine  10 mg Oral Daily  . calcium acetate  2,001 mg Oral TID WC  . carvedilol  12.5 mg Oral BID WC  . cinacalcet  60 mg Oral Q breakfast  . ezetimibe  10 mg Oral Daily  . feeding supplement (PRO-STAT SUGAR FREE 64)  30 mL Oral Daily  . hydrALAZINE  100 mg Oral TID  . losartan  100 mg Oral Daily  . montelukast  10 mg Oral QHS  . multivitamin  1 tablet Oral QHS  . pantoprazole  40 mg Oral BID  . sertraline  50 mg Oral Daily  . torsemide  100 mg Oral Daily   acetaminophen **OR** acetaminophen, diphenhydrAMINE, fluticasone, ibuprofen, ipratropium-albuterol, lidocaine-prilocaine, ondansetron, senna-docusate, traMADol  Assessment/ Plan:  61 y.o. female with end stage renal disease on hemodialysis, hypertension, congestive heart failure, COPD/tobacco abuse, diabetes mellitus type II, depression, obstructive sleep apnea, peripheral vascular disease,  MWF [place].   1.  End-stage renal disease 2.  Anemia of chronic kidney disease 3.  Secondary hyperparathyroidism 4.  Hemopytsis   Hemodialysis today to get her back on her schedule Fluid removal as tolerated Procrit with next hemodialysis as BP control has improved Continue home dose of calcium acetate    LOS: 2 Etienne Mowers 12/28/201812:17 PM  [place], [place]

## 2017-11-22 NOTE — Progress Notes (Signed)
Hemodialysis pre-assessment 

## 2017-11-22 NOTE — Progress Notes (Signed)
Dialysis treatment restarted with new lines due to venous line clotting

## 2017-11-22 NOTE — Progress Notes (Signed)
Hemodialysis treatment completed.

## 2017-11-22 NOTE — Consult Note (Addendum)
PULMONARY CONSULT NOTE  Requesting MD/Service: Mody Date of initial consultation: 12/28 Reason for consultation: Hemoptysis  PT PROFILE: 61 y.o. female former smoker (quit 07/2017) with hx of ESRD, diastolic HF, COPD, chronic hypoxemic respiratory failure (4 LPM [place] A baseline) admitted via ED 12/26 with hemoptysis and bilateral pulmonary infiltrates on CXR, CT chest.    HPI:  As above. She has just finished HD and is extremely lethargic, unable to provide any history. RN reports that she has hemoptysis as recently as today. Described as dark, old blood.   Past Medical History:  Diagnosis Date  . Altered mental status   . Anemia   . Apnea, sleep    for sleep study 10/17/15-no cpap yet  . Arthritis   . Bronchitis   . CHF (congestive heart failure) ([place])   . Chronic kidney disease   . COPD (chronic obstructive pulmonary disease) ([place])   . Depression   . Dialysis patient ([place]) 2014  . GERD (gastroesophageal reflux disease)   . GIB (gastrointestinal bleeding) 07/09/2016  . Headache   . Hyperlipidemia   . Hypertension   . Obesity   . Pulmonary edema 09/14/2016  . Renal dialysis device, implant, or graft complication    RIGHT CHEST CATH  . Renal insufficiency   . Respiratory failure ([place]) 07/23/2016  . Traumatic hematoma of forehead   . Type II diabetes mellitus ([place]) 09/21/2009   diet controlled    Past Surgical History:  Procedure Laterality Date  . ABDOMINAL HYSTERECTOMY    . COLONOSCOPY  2011  . COLONOSCOPY WITH PROPOFOL N/A 09/20/2015   Procedure: COLONOSCOPY WITH PROPOFOL;  Surgeon: Lucilla Lame, MD;  Location: [facility] ENDOSCOPY;  Service: Endoscopy;  Laterality: N/A;  . DIALYSIS FISTULA CREATION    . ESOPHAGOGASTRODUODENOSCOPY N/A 07/13/2016   Procedure: ESOPHAGOGASTRODUODENOSCOPY (EGD);  Surgeon: Lollie Sails, MD;  Location[facility] ENDOSCOPY;  Service: Endoscopy;  Laterality: N/A;  . ESOPHAGOGASTRODUODENOSCOPY (EGD) WITH PROPOFOL N/A 06/02/2016   Procedure:  ESOPHAGOGASTRODUODENOSCOPY (EGD) WITH PROPOFOL;  Surgeon: Manya Silvas, MD;  Location: [facility] ENDOSCOPY;  Service: Endoscopy;  Laterality: N/A;  . LIGATION OF ARTERIOVENOUS  FISTULA Left 05/24/2017   Procedure: LIGATION OF ARTERIOVENOUS  FISTULA ( PERMCATH INSERTION );  Surgeon: Katha Cabal, MD;  Location: [facility] ORS;  Service: Vascular;  Laterality: Left;  . PERIPHERAL VASCULAR CATHETERIZATION N/A 05/10/2015   Procedure: A/V Shuntogram/Fistulagram;  Surgeon: Katha Cabal, MD;  Location: [place] CV LAB;  Service: Cardiovascular;  Laterality: N/A;  . PERIPHERAL VASCULAR CATHETERIZATION Left 05/10/2015   Procedure: A/V Shunt Intervention;  Surgeon: Katha Cabal, MD;  Location: [place] CV LAB;  Service: Cardiovascular;  Laterality: Left;  . PERIPHERAL VASCULAR CATHETERIZATION Left 08/23/2015   Procedure: A/V Shuntogram/Fistulagram;  Surgeon: Katha Cabal, MD;  Location: [place] CV LAB;  Service: Cardiovascular;  Laterality: Left;  . PERIPHERAL VASCULAR CATHETERIZATION N/A 08/23/2015   Procedure: A/V Shunt Intervention;  Surgeon: Katha Cabal, MD;  Location: [place] CV LAB;  Service: Cardiovascular;  Laterality: N/A;  . PERIPHERAL VASCULAR CATHETERIZATION  08/23/2015   Procedure: Dialysis/Perma Catheter Insertion;  Surgeon: Katha Cabal, MD;  Location: [place] CV LAB;  Service: Cardiovascular;;  . PERIPHERAL VASCULAR CATHETERIZATION N/A 01/03/2016   Procedure: Dialysis/Perma Catheter Removal;  Surgeon: Katha Cabal, MD;  Location: [place] CV LAB;  Service: Cardiovascular;  Laterality: N/A;  . REVISON OF ARTERIOVENOUS FISTULA Left 10/28/2015   Procedure: REVISON OF ARTERIOVENOUS FISTULA WITH ARTEGRAFT;  Surgeon: Katha Cabal, MD;  Location:  [facility] ORS;  Service: Vascular;  Laterality: Left;  . WOUND DEBRIDEMENT Left 10/28/2015   Procedure: Resection of shoulder cyst ( left );  Surgeon: Katha Cabal, MD;  Location: [facility] ORS;  Service:  Vascular;  Laterality: Left;    MEDICATIONS: I have reviewed all medications and confirmed regimen as documented  Social History   Socioeconomic History  . Marital status: Married    Spouse name: Not on file  . Number of children: Not on file  . Years of education: Not on file  . Highest education level: Not on file  Social Needs  . Financial resource strain: Not hard at all  . Food insecurity - worry: Never true  . Food insecurity - inability: Never true  . Transportation needs - medical: No  . Transportation needs - non-medical: No  Occupational History  . Not on file  Tobacco Use  . Smoking status: Former Smoker    Years: 40.00    Types: Cigarettes    Last attempt to quit: 07/28/2017    Years since quitting: 0.3  . Smokeless tobacco: Never Used  Substance and Sexual Activity  . Alcohol use: No  . Drug use: No  . Sexual activity: Not on file  Other Topics Concern  . Not on file  Social History Narrative  . Not on file    Family History  Problem Relation Age of Onset  . Heart disease Mother   . Cancer Father   . Cancer Sister   . Breast cancer Sister     ROS: Level 5 Caveat   Vitals:   11/22/17 1315 11/22/17 1330 11/22/17 1345 11/22/17 1400  BP: 135/68 132/64 (!) 142/115 126/60  Pulse: 74 79 77 (!) 57  Resp: 16 17 19 15   Temp:      TempSrc:      SpO2:      Weight:      Height:         EXAM:  Gen: Very lethargic, No overt respiratory distress HEENT: NCAT, sclera white, oropharynx normal Neck: Supple without LAN, thyromegaly, + JVD Lungs: breath sounds diminished throughout with few scattered rhonchi, no wheezes Cardiovascular: Reg, no murmurs noted Abdomen: Soft, nontender, normal BS Ext: without clubbing, cyanosis, edema Neuro: CNs grossly intact, motor and sensory intact Skin: Limited exam, no lesions noted  DATA:   BMP Latest Ref Rng & Units 11/21/2017 11/20/2017 11/07/2017  Glucose 65 - 99 mg/dL 99 166(H) 109(H)  BUN 6 - 20 mg/dL 79(H)  74(H) 48(H)  Creatinine 0.44 - 1.00 mg/dL 9.84(H) 9.71(H) 8.10(H)  Sodium 135 - 145 mmol/L 137 139 138  Potassium 3.5 - 5.1 mmol/L 5.1 4.6 5.7(H)  Chloride 101 - 111 mmol/L 97(L) 99(L) 100(L)  CO2 22 - 32 mmol/L 27 29 27   Calcium 8.9 - 10.3 mg/dL 7.4(L) 7.5(L) 8.2(L)    CBC Latest Ref Rng & Units 11/22/2017 11/21/2017 11/20/2017  WBC 3.6 - 11.0 K/uL 6.7 7.6 6.7  Hemoglobin 12.0 - 16.0 g/dL 8.8(L) 9.0(L) 8.5(L)  Hematocrit 35.0 - 47.0 % 28.1(L) 28.2(L) 26.9(L)  Platelets 150 - 440 K/uL 262 265 260    CXR 11/20/17:  Ptchy B infiltrates  CT chest 11/20/17: diffuse patchy bilateral infiltrates with upper lobe predominance  IMPRESSION:   1. Former smoker 2. COPD 3. Chronic hypoxemic respiratory failure 4. Hemoptysis - this has been intermittently ongoing for some time but acutely worsened prompting ED eval. Most likely, it is due to underlying chronic bronchitis with superimposed airway hyperemia due to volume  overload in setting of ESRD and CHF. However, an endobronchial tumor could be present. Chest CT does not image airways sufficiently to rule this out.   PLAN:  -Complete 5-7 days Levofloxacin -Prednisone 40 mg daily for 5 days -Discontinue montelukast - there is really no role for this med in COPD -It appears that she has nebulized bronchodilators @ home. Continue these as needed -Needs to remain abstinent from all tobacco smoke exposure -I have requested that she follow up in our office in 3-4 weeks with CXR prior -If hemoptysis and/or pulmonary infiltrates persist, we will consider bronchoscopy -From a pulmonary perspective, she may be discharged to home at any time  Merton Border, MD PCCM service Mobile [phone] Pager [phone] 11/22/2017 2:36 PM

## 2017-11-22 NOTE — Progress Notes (Signed)
Pt is alert and oriented. No complaints of pain this shift. Still with some coughing. Pt remaining on her 4L of chronic oxygen.

## 2017-11-22 NOTE — Progress Notes (Signed)
Pt returned from dialysis and is very drowsy again. She will arouse some and go back to sleep. VSS-see flowsheet. Per Kendrick Fries report from HD, pt was awake and talking to other pts. Dr. Benjie Karvonen notified of status on return to unit. Dr. Benjie Karvonen stated that pt just has these episodes and was discussed with Dr. Candiss Norse. No orders received. Will continue to monitor pt.   [place], Jerry Caras

## 2017-11-22 NOTE — Progress Notes (Signed)
RN entered pt's room and pt only aroused for sternal rub stating "that hurt" and then going back to sleep. Dr. Jerelyn Charles notified of condition, vital signs are still stable. Received order for CPAP at night, ABG and ammonia level. Will also check CBG.   [place], Jerry Caras

## 2017-11-22 NOTE — Progress Notes (Signed)
On assessment this am, pt is very drowsy. She sleeps sitting up with her head hung over. Pt will arouse to answer questions or converse, however she will fall back to sleep. Pt is on her chronic 4L of O2 via [place]. Pt also stated she saw a baby at the foot of the bed and thought she was talking on the phone when it was actually her pulse ox. When pt was told that there was no baby in the room and her pulse ox was not a phone, she stated she had hallucinated before. Dr. Benjie Karvonen notified and received orders for CBC and ABG.   [place], Jerry Caras

## 2017-11-22 NOTE — Progress Notes (Signed)
Hemodialysis Treatment Started

## 2017-11-22 NOTE — Progress Notes (Signed)
Hemodialysis Treatment  preassessment

## 2017-11-23 ENCOUNTER — Inpatient Hospital Stay: Payer: Medicare Other

## 2017-11-23 DIAGNOSIS — J441 Chronic obstructive pulmonary disease with (acute) exacerbation: Secondary | ICD-10-CM

## 2017-11-23 LAB — GLUCOSE, CAPILLARY: Glucose-Capillary: 156 mg/dL — ABNORMAL HIGH (ref 65–99)

## 2017-11-23 MED ORDER — IPRATROPIUM-ALBUTEROL 0.5-2.5 (3) MG/3ML IN SOLN
3.0000 mL | RESPIRATORY_TRACT | Status: DC
  Administered 2017-11-23 – 2017-11-24 (×6): 3 mL via RESPIRATORY_TRACT
  Filled 2017-11-23 (×4): qty 3

## 2017-11-23 MED ORDER — METHYLPREDNISOLONE SODIUM SUCC 125 MG IJ SOLR
60.0000 mg | INTRAMUSCULAR | Status: DC
  Administered 2017-11-23 – 2017-11-24 (×2): 60 mg via INTRAVENOUS
  Filled 2017-11-23 (×2): qty 2

## 2017-11-23 MED ORDER — BUDESONIDE 0.5 MG/2ML IN SUSP
0.5000 mg | Freq: Two times a day (BID) | RESPIRATORY_TRACT | Status: DC
  Administered 2017-11-23 – 2017-11-24 (×2): 0.5 mg via RESPIRATORY_TRACT
  Filled 2017-11-23 (×2): qty 2

## 2017-11-23 NOTE — Progress Notes (Signed)
[facility], [place] 11/23/17  Subjective:   Patient is very lethargic today therefore she was transferred to the ICU ABG shows CO2 retention.  PH is 7.32, PCO2 67 Patient is intermittently more awake and is able to answer questions and carry on a conversation but then becomes lethargic  Objective:  Vital signs in last 24 hours:  Temp:  [98 F (36.7 C)-101.9 F (38.8 C)] 99 F (37.2 C) (12/29 1017) Pulse Rate:  [57-95] 95 (12/29 0908) Resp:  [14-28] 20 (12/29 0908) BP: (87-159)/(48-115) 158/81 (12/29 0908) SpO2:  [87 %-100 %] 99 % (12/29 0908) Weight:  [57.3 kg (126 lb 5.2 oz)] 57.3 kg (126 lb 5.2 oz) (12/28 1430)  Weight change: -1.6 kg (-8.4 oz) Filed Weights   11/21/17 1020 11/22/17 0945 11/22/17 1430  Weight: 63.2 kg (139 lb 5.3 oz) 61.6 kg (135 lb 12.9 oz) 57.3 kg (126 lb 5.2 oz)    Intake/Output:    Intake/Output Summary (Last 24 hours) at 11/23/2017 1202 Last data filed at 11/23/2017 1025 Gross per 24 hour  Intake 120 ml  Output 2000 ml  Net -1880 ml     Physical Exam: General: No acute distress, laying in the bed  HEENT Anicteric, moist oral mucous membranes  Neck supple  Pulm/lungs  clear to auscultation bilaterally  CVS/Heart tachycardic  Abdomen:  Soft, nontender  Extremities: Trace edema  Neurologic:  Arousable, able to answer a few questions  Skin: No acute rashes  Access: IJ PermCath AV fistula       Basic Metabolic Panel:  Recent Labs  Lab 11/20/17 1515 11/21/17 0509  NA 139 137  K 4.6 5.1  CL 99* 97*  CO2 29 27  GLUCOSE 166* 99  BUN 74* 79*  CREATININE 9.71* 9.84*  CALCIUM 7.5* 7.4*     CBC: Recent Labs  Lab 11/20/17 1515 11/21/17 0509 11/22/17 0900  WBC 6.7 7.6 6.7  HGB 8.5* 9.0* 8.8*  HCT 26.9* 28.2* 28.1*  MCV 104.3* 103.9* 104.7*  PLT 260 265 262      Lab Results  Component Value Date   HEPBSAG Negative 10/06/2017   HEPBSAB Reactive 10/06/2017      Microbiology:  Recent  Results (from the past 240 hour(s))  Culture, blood (routine x 2)     Status: None (Preliminary result)   Collection Time: 11/20/17  5:34 PM  Result Value Ref Range Status   Specimen Description BLOOD RIGHT ANTECUBITAL  Final   Special Requests   Final    BOTTLES DRAWN AEROBIC AND ANAEROBIC Blood Culture adequate volume   Culture   Final    NO GROWTH 3 DAYS Performed at [facility], [address] [place], [place] [address]    Report Status PENDING  Incomplete  Culture, blood (routine x 2)     Status: None (Preliminary result)   Collection Time: 11/20/17  5:34 PM  Result Value Ref Range Status   Specimen Description BLOOD BLOOD RIGHT ARM  Final   Special Requests   Final    BOTTLES DRAWN AEROBIC AND ANAEROBIC Blood Culture adequate volume   Culture   Final    NO GROWTH 3 DAYS Performed at [facility], [address], [place],  [address]    Report Status PENDING  Incomplete  MRSA PCR Screening     Status: None   Collection Time: 11/20/17  9:52 PM  Result Value Ref Range Status   MRSA by PCR NEGATIVE NEGATIVE Final    Comment:  The GeneXpert MRSA Assay (FDA approved for NASAL specimens only), is one component of a comprehensive MRSA colonization surveillance program. It is not intended to diagnose MRSA infection nor to guide or monitor treatment for MRSA infections. Performed at [facility], [address] [place], [place] [address]     Coagulation Studies: Recent Labs    11/20/17 1637  LABPROT 13.2  INR 1.01    Urinalysis: No results for input(s): COLORURINE, LABSPEC, PHURINE, GLUCOSEU, HGBUR, BILIRUBINUR, KETONESUR, PROTEINUR, UROBILINOGEN, NITRITE, LEUKOCYTESUR in the last 72 hours.  Invalid input(s): APPERANCEUR    Imaging: Dg Chest 1 View  Result Date: 11/23/2017 CLINICAL DATA:  Fever and lethargy. End-stage renal disease. Heart failure. COPD. EXAM: CHEST 1 VIEW COMPARISON:  11/20/2017 plain film and CT.  FINDINGS: Dialysis catheter tip at low SVC. Midline trachea. Moderate cardiomegaly. Tortuous thoracic aorta. No pleural effusion or pneumothorax. Interstitial and airspace disease is significantly improved, mild. Slightly greater on the left than right. IMPRESSION: Cardiomegaly with improved interstitial and airspace opacities bilaterally. Favor resolving pulmonary edema versus infection. Electronically Signed   By: Abigail Miyamoto M.D.   On: 11/23/2017 10:37     Medications:   . levofloxacin (LEVAQUIN) IV Stopped (11/23/17 4132)   . ALPRAZolam  0.5 mg Oral BID  . amLODipine  10 mg Oral Daily  . calcium acetate  2,001 mg Oral TID WC  . carvedilol  12.5 mg Oral BID WC  . cinacalcet  60 mg Oral Q breakfast  . ezetimibe  10 mg Oral Daily  . feeding supplement (PRO-STAT SUGAR FREE 64)  30 mL Oral Daily  . hydrALAZINE  100 mg Oral TID  . losartan  100 mg Oral Daily  . methylPREDNISolone (SOLU-MEDROL) injection  60 mg Intravenous Q24H  . multivitamin  1 tablet Oral QHS  . pantoprazole  40 mg Oral BID  . sertraline  50 mg Oral Daily  . torsemide  100 mg Oral Daily   acetaminophen **OR** acetaminophen, diphenhydrAMINE, fluticasone, ibuprofen, ipratropium-albuterol, lidocaine-prilocaine, ondansetron, senna-docusate, traMADol  Assessment/ Plan:  61 y.o. female with end stage renal disease on hemodialysis, hypertension, congestive heart failure, COPD/tobacco abuse, diabetes mellitus type II, depression, obstructive sleep apnea, peripheral vascular disease,  MWF [place] EDW 58.5 Kg  1.  End-stage renal disease 2.  Anemia of chronic kidney disease 3.  Secondary hyperparathyroidism 4.  Hemopytsis   Next hemodialysis planned for Monday [place] with hemodialysis  Continue home dose of calcium acetate    LOS: 3 [place] Nalepa 12/29/201812:02 PM  [place], [place]

## 2017-11-23 NOTE — Progress Notes (Signed)
rec'd from 1A to room ICU7 in NAD.  Remains on 4L [place]. Respiratory in to evaluate.  Bipap not added as of yet.  Drowsy but easily arousable. Bubba Camp, RN

## 2017-11-23 NOTE — Progress Notes (Signed)
Pt wanted to sleep, placed on Hospital CPAP unit with 4lpm bleed in. Tolerating well at this time

## 2017-11-23 NOTE — Progress Notes (Addendum)
Patient with CO2retnetion and sleepy needs ICU and BIPAP Left VM Dr Mortimer Fries Start IV steroid CC TIME 30 minutes

## 2017-11-23 NOTE — Consult Note (Signed)
PULMONARY CONSULT NOTE  Requesting MD/Service: Mody Date of initial consultation: 12/28 Reason for consultation: resp failure  PT PROFILE: 61 y.o. female former smoker (quit 07/2017) with hx of ESRD, diastolic HF, COPD, chronic hypoxemic respiratory failure (4 LPM [place] A baseline) admitted via ED 12/26 with hemoptysis and bilateral pulmonary infiltrates on CXR, CT chest.    HPI:   extremely lethargic, unable to provide any history.  Elevated PCO2 Placed on biPAP  ROS: unobtainable   Vitals:   11/23/17 0531 11/23/17 0908 11/23/17 1017 11/23/17 1219  BP: 139/77 (!) 158/81    Pulse: 89 95  70  Resp: 14 20  16   Temp: 98 F (36.7 C) (!) 101.9 F (38.8 C) 99 F (37.2 C) 98.6 F (37 C)  TempSrc: Oral Oral  Oral  SpO2: 100% 99%  97%  Weight:    130 lb 1.1 oz (59 kg)  Height:    5\' 5"  (1.651 m)     EXAM:  Gen: Very lethargic, No overt respiratory distress, on biPAP HEENT: NCAT, sclera white, oropharynx normal Neck: Supple without LAN, thyromegaly, + JVD Lungs: breath sounds diminished throughout with few scattered rhonchi, no wheezes Cardiovascular: Reg, no murmurs noted Abdomen: Soft, nontender, normal BS Ext: without clubbing, cyanosis, edema Neuro: CNs grossly intact, motor and sensory intact Skin: Limited exam, no lesions noted  DATA:   BMP Latest Ref Rng & Units 11/21/2017 11/20/2017 11/07/2017  Glucose 65 - 99 mg/dL 99 166(H) 109(H)  BUN 6 - 20 mg/dL 79(H) 74(H) 48(H)  Creatinine 0.44 - 1.00 mg/dL 9.84(H) 9.71(H) 8.10(H)  Sodium 135 - 145 mmol/L 137 139 138  Potassium 3.5 - 5.1 mmol/L 5.1 4.6 5.7(H)  Chloride 101 - 111 mmol/L 97(L) 99(L) 100(L)  CO2 22 - 32 mmol/L 27 29 27   Calcium 8.9 - 10.3 mg/dL 7.4(L) 7.5(L) 8.2(L)    CBC Latest Ref Rng & Units 11/22/2017 11/21/2017 11/20/2017  WBC 3.6 - 11.0 K/uL 6.7 7.6 6.7  Hemoglobin 12.0 - 16.0 g/dL 8.8(L) 9.0(L) 8.5(L)  Hematocrit 35.0 - 47.0 % 28.1(L) 28.2(L) 26.9(L)  Platelets 150 - 440 K/uL 262 265 260    CXR  11/20/17:  Ptchy B infiltrates  CT chest 11/20/17: diffuse patchy bilateral infiltrates with upper lobe predominance  IMPRESSION:   1. Former smoker 2. COPD 3. Chronic hypoxemic respiratory failure 4. Hemoptysis - this has been intermittently ongoing for some time but acutely worsened prompting ED eval. Most likely, it is due to underlying chronic bronchitis with superimposed airway hyperemia due to volume overload in setting of ESRD and CHF. However, an endobronchial tumor could be present. Chest CT does not image airways sufficiently to rule this out.  5.hypercapnia-with COPD   PLAN:  -BiPAP as needed -BD therapy -continue steroids and Iv abx Avoid sedatives  Patient with multiple admission for acute on chronic hypercapnic resp failure, patient may benefit from long term trach  Corrin Parker, M.D.  [facility]  Medical Director [place] Director [place] Department

## 2017-11-23 NOTE — NC FL2 (Signed)
[place] LEVEL OF CARE SCREENING TOOL     IDENTIFICATION  Patient Name: Michele Nash Birthdate: Apr 20, 1956 Sex: female Admission Date (Current Location): 11/20/2017  [place] and [place] Number:  Selena Lesser [id number] [place] and Address:  [facility], [address], [place], [place] [address]      Provider Number: [id number]  Attending Physician Name and Address:  Bettey Costa, MD  Relative Name and Phone Number:  Tesa Meadors (Spouse) [phone]    Current Level of Care: Hospital Recommended Level of Care: [place] Prior Approval Number:    Date Approved/Denied: 11/23/17 PASRR Number: [id number]  Discharge Plan: SNF    Current Diagnoses: Patient Active Problem List   Diagnosis Date Noted  . Hemoptysis 11/20/2017  . Dizziness 06/15/2017  . Chronic diastolic CHF (congestive heart failure) ([place]) 06/15/2017  . Hyperkalemia 05/21/2017  . Multiple closed fractures of ribs of right side   . Hyperlipidemia 11/28/2016  . Altered mental status   . Fall   . Protein-calorie malnutrition, severe 05/29/2016  . Hyperglycemia 05/28/2016  . Pain in limb 10/28/2015  . ESRD on dialysis ([place]) 10/28/2015  . HTN (hypertension) 03/29/2015  . COPD (chronic obstructive pulmonary disease) ([place]) 03/29/2015  . GAD (generalized anxiety disorder) 03/29/2015  . Chronic kidney disease 10/18/2009  . Personal history of tobacco use, presenting hazards to health 10/18/2009  . Type II diabetes mellitus ([place]) 09/21/2009  . Disorder of uterus 02/14/1990    Orientation RESPIRATION BLADDER Height & Weight     Self, Time, Situation, Place  O2(Bi-pap (acute); 4L o2 (chronic)) Continent Weight: 130 lb 1.1 oz (59 kg) Height:  5\' 5"  (165.1 cm)  BEHAVIORAL SYMPTOMS/MOOD NEUROLOGICAL BOWEL NUTRITION STATUS      Continent Diet(Renal with fluid restriction (1200 mL))  AMBULATORY STATUS COMMUNICATION OF NEEDS Skin   Extensive Assist  Verbally Normal                       Personal Care Assistance Level of Assistance  Bathing, Feeding, Dressing Bathing Assistance: Limited assistance Feeding assistance: Independent Dressing Assistance: Limited assistance     Functional Limitations Info             SPECIAL CARE FACTORS FREQUENCY  PT (By licensed PT)     PT Frequency: Up to 5X per day, 5 days per week              Contractures Contractures Info: Not present    Additional Factors Info  Code Status, Allergies, Psychotropic Code Status Info: Full Allergies Info: Sulfa Antibiotics Psychotropic Info: Xanax         Current Medications (11/23/2017):  This is the current hospital active medication list Current Facility-Administered Medications  Medication Dose Route Frequency Provider Last Rate Last Dose  . acetaminophen (TYLENOL) tablet 650 mg  650 mg Oral Q6H PRN Bettey Costa, MD   650 mg at 11/23/17 1194   Or  . acetaminophen (TYLENOL) suppository 650 mg  650 mg Rectal Q6H PRN Bettey Costa, MD      . ALPRAZolam Duanne Moron) tablet 0.5 mg  0.5 mg Oral BID Bettey Costa, MD   0.5 mg at 11/23/17 0916  . amLODipine (NORVASC) tablet 10 mg  10 mg Oral Daily Bettey Costa, MD   10 mg at 11/23/17 0916  . budesonide (PULMICORT) nebulizer solution 0.5 mg  0.5 mg Nebulization BID Flora Lipps, MD      . calcium acetate (PHOSLO) capsule 2,001 mg  2,001 mg Oral  TID WC Bettey Costa, MD   2,001 mg at 11/23/17 1135  . carvedilol (COREG) tablet 12.5 mg  12.5 mg Oral BID WC Bettey Costa, MD   12.5 mg at 11/23/17 0917  . cinacalcet (SENSIPAR) tablet 60 mg  60 mg Oral Q breakfast Bettey Costa, MD   60 mg at 11/23/17 0916  . diphenhydrAMINE (BENADRYL) injection 25 mg  25 mg Intravenous Q6H PRN Salary, Montell D, MD   25 mg at 11/23/17 0103  . ezetimibe (ZETIA) tablet 10 mg  10 mg Oral Daily Bettey Costa, MD   10 mg at 11/23/17 0916  . feeding supplement (PRO-STAT SUGAR FREE 64) liquid 30 mL  30 mL Oral Daily Bettey Costa, MD   30 mL at  11/23/17 0921  . fluticasone (FLONASE) 50 MCG/ACT nasal spray 2 spray  2 spray Each Nare Daily PRN Bettey Costa, MD   2 spray at 11/22/17 0906  . hydrALAZINE (APRESOLINE) tablet 100 mg  100 mg Oral TID Bettey Costa, MD   100 mg at 11/23/17 0917  . ibuprofen (ADVIL,MOTRIN) tablet 400 mg  400 mg Oral Q6H PRN Mody, Sital, MD      . ipratropium-albuterol (DUONEB) 0.5-2.5 (3) MG/3ML nebulizer solution 3 mL  3 mL Nebulization Q6H PRN Mody, Sital, MD      . ipratropium-albuterol (DUONEB) 0.5-2.5 (3) MG/3ML nebulizer solution 3 mL  3 mL Nebulization Q4H Flora Lipps, MD   3 mL at 11/23/17 1443  . Levofloxacin (LEVAQUIN) IVPB 250 mg  250 mg Intravenous Q48H Bettey Costa, MD   Stopped at 11/23/17 0212  . lidocaine-prilocaine (EMLA) cream 1 application  1 application Topical PRN Mody, Sital, MD      . losartan (COZAAR) tablet 100 mg  100 mg Oral Daily Bettey Costa, MD   100 mg at 11/23/17 0916  . methylPREDNISolone sodium succinate (SOLU-MEDROL) 125 mg/2 mL injection 60 mg  60 mg Intravenous Q24H Bettey Costa, MD   60 mg at 11/23/17 1133  . multivitamin (RENA-VIT) tablet 1 tablet  1 tablet Oral QHS Bettey Costa, MD   1 tablet at 11/22/17 2111  . ondansetron (ZOFRAN) tablet 4 mg  4 mg Oral Q8H PRN Bettey Costa, MD   4 mg at 11/23/17 0927  . pantoprazole (PROTONIX) EC tablet 40 mg  40 mg Oral BID Bettey Costa, MD   40 mg at 11/23/17 0916  . senna-docusate (Senokot-S) tablet 1 tablet  1 tablet Oral QHS PRN Bettey Costa, MD      . sertraline (ZOLOFT) tablet 50 mg  50 mg Oral Daily Bettey Costa, MD   50 mg at 11/23/17 0916  . torsemide (DEMADEX) tablet 100 mg  100 mg Oral Daily Bettey Costa, MD   100 mg at 11/23/17 0916  . traMADol (ULTRAM) tablet 50 mg  50 mg Oral Q12H PRN Bettey Costa, MD         Discharge Medications: Please see discharge summary for a list of discharge medications.  Relevant Imaging Results:  Relevant Lab Results:   Additional Information SS# [id number]  Zettie Pho, LCSW

## 2017-11-23 NOTE — Progress Notes (Signed)
Pt. Refused bipap at this time,states she wants to wait transfer to ICU to place it on.

## 2017-11-23 NOTE — Progress Notes (Signed)
Pt cont to be lethargic.  Arousable but drowsy.  At times sats trending down to 83%.  R.T notified and masked changed out for larger one. Husband at [facility]. Updated on progress. Bubba Camp, RN

## 2017-11-23 NOTE — Progress Notes (Signed)
Pt more alert and following commands.  Switch to 4L [place] from cpap to eat supper. Cont to not feel the urge to void.  Bladder scan = 171ml.   Bubba Camp, RN

## 2017-11-23 NOTE — Progress Notes (Signed)
ANTIBIOTIC CONSULT NOTE - INITIAL  Pharmacy Consult for Levaquin  Indication: pneumonia  Allergies  Allergen Reactions  . Sulfa Antibiotics Itching, Swelling, Rash and Other (See Comments)    Reaction:  Facial/body swelling     Patient Measurements: Height: 5\' 5"  (165.1 cm) Weight: 126 lb 5.2 oz (57.3 kg) IBW/kg (Calculated) : 57 Adjusted Body Weight:   Vital Signs: Temp: 99 F (37.2 C) (12/29 1017) Temp Source: Oral (12/29 0908) BP: 158/81 (12/29 0908) Pulse Rate: 95 (12/29 0908) Intake/Output from previous day: 12/28 0701 - 12/29 0700 In: 240 [P.O.:240] Out: 2000  Intake/Output from this shift: Total I/O In: 120 [P.O.:120] Out: -   Labs: Recent Labs    11/20/17 1515 11/21/17 0509 11/22/17 0900  WBC 6.7 7.6 6.7  HGB 8.5* 9.0* 8.8*  PLT 260 265 262  CREATININE 9.71* 9.84*  --    Estimated Creatinine Clearance: 5.3 mL/min (A) (by C-G formula based on SCr of 9.84 mg/dL (H)). No results for input(s): VANCOTROUGH, VANCOPEAK, VANCORANDOM, GENTTROUGH, GENTPEAK, GENTRANDOM, TOBRATROUGH, TOBRAPEAK, TOBRARND, AMIKACINPEAK, AMIKACINTROU, AMIKACIN in the last 72 hours.   Microbiology: Recent Results (from the past 720 hour(s))  Culture, blood (routine x 2)     Status: None (Preliminary result)   Collection Time: 11/20/17  5:34 PM  Result Value Ref Range Status   Specimen Description BLOOD RIGHT ANTECUBITAL  Final   Special Requests   Final    BOTTLES DRAWN AEROBIC AND ANAEROBIC Blood Culture adequate volume   Culture   Final    NO GROWTH 3 DAYS Performed at [facility], [address] [place], [place] [address]    Report Status PENDING  Incomplete  Culture, blood (routine x 2)     Status: None (Preliminary result)   Collection Time: 11/20/17  5:34 PM  Result Value Ref Range Status   Specimen Description BLOOD BLOOD RIGHT ARM  Final   Special Requests   Final    BOTTLES DRAWN AEROBIC AND ANAEROBIC Blood Culture adequate volume   Culture   Final    NO  GROWTH 3 DAYS Performed at [facility], [address], [place], [place] [address]    Report Status PENDING  Incomplete  MRSA PCR Screening     Status: None   Collection Time: 11/20/17  9:52 PM  Result Value Ref Range Status   MRSA by PCR NEGATIVE NEGATIVE Final    Comment:        The GeneXpert MRSA Assay (FDA approved for NASAL specimens only), is one component of a comprehensive MRSA colonization surveillance program. It is not intended to diagnose MRSA infection nor to guide or monitor treatment for MRSA infections. Performed at [facility], [address], [place],  [address]     Medical History: Past Medical History:  Diagnosis Date  . Altered mental status   . Anemia   . Apnea, sleep    for sleep study 10/17/15-no cpap yet  . Arthritis   . Bronchitis   . CHF (congestive heart failure) ([place])   . Chronic kidney disease   . COPD (chronic obstructive pulmonary disease) ([place])   . Depression   . Dialysis patient ([place]) 2014  . GERD (gastroesophageal reflux disease)   . GIB (gastrointestinal bleeding) 07/09/2016  . Headache   . Hyperlipidemia   . Hypertension   . Obesity   . Pulmonary edema 09/14/2016  . Renal dialysis device, implant, or graft complication    RIGHT CHEST CATH  . Renal insufficiency   .  Respiratory failure ([place]) 07/23/2016  . Traumatic hematoma of forehead   . Type II diabetes mellitus ([place]) 09/21/2009   diet controlled    Medications:  Scheduled:  . ALPRAZolam  0.5 mg Oral BID  . amLODipine  10 mg Oral Daily  . calcium acetate  2,001 mg Oral TID WC  . carvedilol  12.5 mg Oral BID WC  . cinacalcet  60 mg Oral Q breakfast  . ezetimibe  10 mg Oral Daily  . feeding supplement (PRO-STAT SUGAR FREE 64)  30 mL Oral Daily  . hydrALAZINE  100 mg Oral TID  . losartan  100 mg Oral Daily  . methylPREDNISolone (SOLU-MEDROL) injection  60 mg Intravenous Q24H  . multivitamin  1 tablet Oral QHS  . pantoprazole  40 mg Oral BID   . sertraline  50 mg Oral Daily  . torsemide  100 mg Oral Daily   Assessment: CrCl = 5.3 ml/min,  Pt on HD every MWF   Goal of Therapy:  resolution of infection   Plan:  Continue levofloxacin 250mg  q 48 hours. Recommend 2 more doses for a total of 7 days  Ramond Dial, Pharm.D, BCPS Clinical Pharmacist  11/23/2017,12:06 PM

## 2017-11-23 NOTE — Progress Notes (Signed)
[facility] NAME: Michele Nash    MR#:  [id number]  DATE OF BIRTH:  18-Feb-1956  SUBJECTIVE:   Patient had fever this am does not seem fully awake this am   REVIEW OF SYSTEMS:    Review of Systems  Constitutional: ++fevers no chills + sleepy lethargic  HENT: Negative for ear pain, nosebleeds, congestion, facial swelling, rhinorrhea, neck pain, neck stiffness and ear discharge.   Respiratory: Negative for cough, shortness of breath, wheezing  Positive hemoptysis Cardiovascular: Negative for chest pain, palpitations and leg swelling.  Gastrointestinal: Negative for heartburn, abdominal pain, vomiting, diarrhea or consitpation Genitourinary: Negative for dysuria, urgency, frequency, hematuria Musculoskeletal: Negative for back pain or joint pain Neurological: Negative for dizziness, seizures, syncope, focal weakness,  numbness and headaches.  Hematological: Does not bruise/bleed easily.  Psychiatric/Behavioral: Negative for hallucinations, confusion, dysphoric mood    Tolerating Diet: yes      DRUG ALLERGIES:   Allergies  Allergen Reactions  . Sulfa Antibiotics Itching, Swelling, Rash and Other (See Comments)    Reaction:  Facial/body swelling     VITALS:  Blood pressure (!) 158/81, pulse 95, temperature (!) 101.9 F (38.8 C), temperature source Oral, resp. rate 20, height 5\' 5"  (1.651 m), weight 57.3 kg (126 lb 5.2 oz), SpO2 99 %.  PHYSICAL EXAMINATION:  Constitutional: Appears well-developed and well-nourished. No distress. HENT: Normocephalic. Marland Kitchen Oropharynx is clear and moist.  Eyes: Conjunctivae and EOM are normal. PERRLA, no scleral icterus.  Neck: Normal ROM. Neck supple. No JVD. No tracheal deviation. CVS: RRR, S1/S2 +, no murmurs, no gallops, no carotid bruit.  Pulmonary: Effort and breath sounds normal, no stridor, rhonchi, wheezes, rales.  Abdominal: Soft. BS +,  no distension, tenderness, rebound or  guarding.  Musculoskeletal: Normal range of motion. No edema and no tenderness.  Neuro:sleepy answers questions though N 2-12 grossly intact. No focal deficits. Skin: Skin is warm and dry. No rash noted. Psychiatric: sleepy this am.      LABORATORY PANEL:   CBC Recent Labs  Lab 11/22/17 0900  WBC 6.7  HGB 8.8*  HCT 28.1*  PLT 262   ------------------------------------------------------------------------------------------------------------------  Chemistries  Recent Labs  Lab 11/21/17 0509  NA 137  K 5.1  CL 97*  CO2 27  GLUCOSE 99  BUN 79*  CREATININE 9.84*  CALCIUM 7.4*   ------------------------------------------------------------------------------------------------------------------  Cardiac Enzymes Recent Labs  Lab 11/20/17 1515  TROPONINI 0.14*   ------------------------------------------------------------------------------------------------------------------  RADIOLOGY:  No results found.   ASSESSMENT AND PLAN:   61 year old female with history of end-stage renal disease on hemodialysis, chronic diastolic heart failure and chronic respiratory failure due to COPD on 4 L of oxygen who presents with hemoptysis.  1. Hemoptysis: CT scan does not show evidence of pulmonary emboli however it is concerning for increasing dense reticular and alveolar opacities within the upper lobes concerning for possible pulmonary hemorrhage versus interstitial lung disease Continue steroids 40 mg daily for 4 more days Continue Levaquin empirically for treatment of pneumonia for 4 more days hgb stable Needs repeat CXR/CT in 3-4 weeks Will follow up with Dr Shaune Pascal in 3 weeks  2. Acute encephalopathy/Fever: ORDER ABG stat CXR and UA ordered Blood cx ordered  3. Acute on chronic anemia from chronic disease Hgb stable  4 End-stage renal disease on hemodialysis:Continue HD as per nephrology  5 Chronic hypoxic respiratory failure due to COPD without signs of  exacerbation Continue nebs and inhalers  6 Essential hypertension: Continue Norvasc,  Coreg, hydralazine, HCTZ, losartan  7 Chronic diastolic heart failure without signs of exacerbation: Continue Torsemide           Management plans discussed with the patient and she is in agreement.  CODE STATUS: full  TOTAL TIME TAKING CARE OF THIS PATIENT: 30 minutes.     POSSIBLE D/C tomorrow, DEPENDING ON CLINICAL CONDITION.   Candice Lunney M.D on 11/23/2017 at 10:35 AM  Between 7am to 6pm - Pager - [phone] After 6pm go to [email or web address] - password [email or web address] [facility]  Office  [phone]  CC: Primary care physician; [name], MD  Note: This dictation was prepared with Dragon dictation along with smaller phrase technology. Any transcriptional errors that result from this process are unintentional.

## 2017-11-23 NOTE — Progress Notes (Signed)
1800: pt's cell phone and charger sent home with husband.  Encouraged pt to send jewelry as well, but she wanted to cont wearing them.  Bubba Camp, RN

## 2017-11-23 NOTE — Evaluation (Signed)
Physical Therapy Evaluation Patient Details Name: Michele Nash MRN: [id number] DOB: [birth year] Today's Date: 11/23/2017   History of Present Illness  61 y.o. female who is admitted with PNA but cleared PE.  Has hemoptysis but chronic bronchitis caused.  Resp failure with volume overload.   PMHx:  respiratory failure, diastolic CHF, ESRD (MWF), COPD, htn, CHF, DM.  Clinical Impression  Pt very lethargic and was just taken to ICU in the time from when PT saw her to noting the visit.  Her lethargy was not her PLOF but pt was not able to give the information to PT for when she was at home.  Pt will need a new order to remain on therapy in ICU and will await further instructions for continuation of therapy.  Pt is agreeable with working on treatment but did not have much tolerance.  Will progress as ordered by MD.    Follow Up Recommendations SNF    Equipment Recommendations  None recommended by PT    Recommendations for Other Services       Precautions / Restrictions Precautions Precautions: Fall(telemetry) Restrictions Weight Bearing Restrictions: No      Mobility  Bed Mobility Overal bed mobility: Needs Assistance Bed Mobility: Supine to Sit;Sit to Supine     Supine to sit: Mod assist Sit to supine: Mod assist   General bed mobility comments: help to lift trunk and scoot to EOB and then to lift legs back to bed  Transfers                 General transfer comment: unable to stand due to lethargy and nausea  Ambulation/Gait                Stairs            Wheelchair Mobility    Modified Rankin (Stroke Patients Only)       Balance Overall balance assessment: Needs assistance Sitting-balance support: Feet supported;Bilateral upper extremity supported Sitting balance-Leahy Scale: Poor                                       Pertinent Vitals/Pain Pain Assessment: Faces Faces Pain Scale: Hurts a little bit Pain Location:  stiffness in legs Pain Intervention(s): Monitored during session;Repositioned    Home Living Family/patient expects to be discharged to:: Private residence Living Arrangements: Spouse/significant other Available Help at Discharge: Family;Available PRN/intermittently Type of Home: Apartment Home Access: Stairs to enter Entrance Stairs-Rails: Chemical engineer of Steps: flight Home Layout: One level Home Equipment: (pt unable to tell PT and lethargic) Additional Comments: home information from chart as pt is lethargic    Prior Function                 Hand Dominance   Dominant Hand: Right    Extremity/Trunk Assessment   Upper Extremity Assessment Upper Extremity Assessment: Generalized weakness    Lower Extremity Assessment Lower Extremity Assessment: Generalized weakness    Cervical / Trunk Assessment Cervical / Trunk Assessment: Kyphotic  Communication   Communication: No difficulties  Cognition Arousal/Alertness: Lethargic Behavior During Therapy: Flat affect Overall Cognitive Status: No family/caregiver present to determine baseline cognitive functioning                                 General Comments: pt could not answer questions about PLOF  and was nauseated with mobility      General Comments      Exercises     Assessment/Plan    PT Assessment Patient needs continued PT services  PT Problem List Decreased strength;Decreased activity tolerance;Decreased balance;Decreased mobility;Decreased coordination;Decreased cognition;Decreased safety awareness;Cardiopulmonary status limiting activity;Decreased skin integrity       PT Treatment Interventions DME instruction;Gait training;Stair training;Functional mobility training;Therapeutic activities;Therapeutic exercise;Balance training;Neuromuscular re-education;Patient/family education    PT Goals (Current goals can be found in the Care Plan section)  Acute Rehab PT  Goals Patient Stated Goal: none stated PT Goal Formulation: Patient unable to participate in goal setting Time For Goal Achievement: 12/07/17 Potential to Achieve Goals: Good    Frequency Min 2X/week   Barriers to discharge Other (comment)(no clear information about help at home)      Co-evaluation               AM-PAC PT "6 Clicks" Daily Activity  Outcome Measure Difficulty turning over in bed (including adjusting bedclothes, sheets and blankets)?: Unable Difficulty moving from lying on back to sitting on the side of the bed? : Unable Difficulty sitting down on and standing up from a chair with arms (e.g., wheelchair, bedside commode, etc,.)?: Unable Help needed moving to and from a bed to chair (including a wheelchair)?: A Lot Help needed walking in hospital room?: Total Help needed climbing 3-5 steps with a railing? : Total 6 Click Score: 7    End of Session Equipment Utilized During Treatment: Oxygen Activity Tolerance: Patient limited by fatigue;Patient limited by lethargy;Other (comment)(nauseated with mobility) Patient left: in bed;with call bell/phone within reach;with bed alarm set Nurse Communication: Mobility status PT Visit Diagnosis: Muscle weakness (generalized) (M62.81);Other abnormalities of gait and mobility (R26.89);Difficulty in walking, not elsewhere classified (R26.2);Adult, failure to thrive (R62.7)    Time: 0935-1008 PT Time Calculation (min) (ACUTE ONLY): 33 min   Charges:   PT Evaluation $PT Eval Moderate Complexity: 1 Mod PT Treatments $Therapeutic Activity: 8-22 mins   PT G Codes:   PT G-Codes **NOT FOR INPATIENT CLASS** Functional Assessment Tool Used: AM-PAC 6 Clicks Basic Mobility    Ramond Dial 11/23/2017, 1:07 PM   Mee Hives, PT MS Acute Rehab Dept. Number: [place] and [place]

## 2017-11-24 LAB — CBC
HCT: 26.2 % — ABNORMAL LOW (ref 35.0–47.0)
HEMOGLOBIN: 8.2 g/dL — AB (ref 12.0–16.0)
MCH: 32.9 pg (ref 26.0–34.0)
MCHC: 31.5 g/dL — AB (ref 32.0–36.0)
MCV: 104.5 fL — ABNORMAL HIGH (ref 80.0–100.0)
Platelets: 252 10*3/uL (ref 150–440)
RBC: 2.51 MIL/uL — ABNORMAL LOW (ref 3.80–5.20)
RDW: 15.6 % — AB (ref 11.5–14.5)
WBC: 5.7 10*3/uL (ref 3.6–11.0)

## 2017-11-24 LAB — BASIC METABOLIC PANEL
Anion gap: 10 (ref 5–15)
BUN: 46 mg/dL — AB (ref 6–20)
CALCIUM: 7.9 mg/dL — AB (ref 8.9–10.3)
CHLORIDE: 97 mmol/L — AB (ref 101–111)
CO2: 30 mmol/L (ref 22–32)
CREATININE: 6.63 mg/dL — AB (ref 0.44–1.00)
GFR calc non Af Amer: 6 mL/min — ABNORMAL LOW (ref >60)
GFR, EST AFRICAN AMERICAN: 7 mL/min — AB (ref >60)
Glucose, Bld: 108 mg/dL — ABNORMAL HIGH (ref 65–99)
Potassium: 5 mmol/L (ref 3.5–5.1)
SODIUM: 137 mmol/L (ref 135–145)

## 2017-11-24 LAB — MAGNESIUM: Magnesium: 1.6 mg/dL — ABNORMAL LOW (ref 1.7–2.4)

## 2017-11-24 LAB — URIC ACID: URIC ACID, SERUM: 4.6 mg/dL (ref 2.3–6.6)

## 2017-11-24 LAB — PHOSPHORUS: Phosphorus: 5.8 mg/dL — ABNORMAL HIGH (ref 2.5–4.6)

## 2017-11-24 MED ORDER — LEVOFLOXACIN 250 MG PO TABS: 250.0000 mg | ORAL_TABLET | ORAL | 0 refills | Status: AC

## 2017-11-24 MED ORDER — LEVOFLOXACIN 250 MG PO TABS: 750.0000 mg | ORAL_TABLET | ORAL | 0 refills | Status: DC

## 2017-11-24 MED ORDER — PREDNISONE 20 MG PO TABS
40.0000 mg | ORAL_TABLET | Freq: Every day | ORAL | 0 refills | Status: AC
Start: 2017-11-24 — End: 2017-11-29

## 2017-11-24 NOTE — Progress Notes (Signed)
PULMONARY CONSULT NOTE  Requesting MD/Service: Mody Date of initial consultation: 12/28 Reason for consultation: resp failure  PT PROFILE: 61 y.o. female former smoker (quit 07/2017) with hx of ESRD, diastolic HF, COPD, chronic hypoxemic respiratory failure (4 LPM [place] A baseline) admitted via ED 12/26 with hemoptysis and bilateral pulmonary infiltrates on CXR, CT chest.    HPI:   extremely lethargic, unable to provide any history.  Elevated PCO2 Placed on biPAP  Subjective: No acute issues overnight.  Off BiPAP.  Offers no complaints her blood pressure and heart rate stable   Vitals:   11/24/17 0200 11/24/17 0300 11/24/17 0400 11/24/17 0500  BP: (!) 110/56 99/61 125/73 121/63  Pulse: 70 68 72 70  Resp: 18 17 (!) 24 19  Temp: 97.6 F (36.4 C)     TempSrc: Axillary     SpO2: 96% 97% 91% 96%  Weight:      Height:        EXAM:  Gen: Awake, pleasant no acute distress HEENT: NCAT, sclera white, oropharynx normal Neck: Supple without LAN, thyromegaly, + JVD Lungs: breath sounds diminished throughout with few scattered rhonchi, no wheezes Cardiovascular: Reg, no murmurs noted Abdomen: Soft, nontender, normal BS Ext: without clubbing, cyanosis, edema Neuro: CNs grossly intact, motor and sensory intact Skin: Limited exam, no lesions noted  DATA:   BMP Latest Ref Rng & Units 11/24/2017 11/21/2017 11/20/2017  Glucose 65 - 99 mg/dL 108(H) 99 166(H)  BUN 6 - 20 mg/dL 46(H) 79(H) 74(H)  Creatinine 0.44 - 1.00 mg/dL 6.63(H) 9.84(H) 9.71(H)  Sodium 135 - 145 mmol/L 137 137 139  Potassium 3.5 - 5.1 mmol/L 5.0 5.1 4.6  Chloride 101 - 111 mmol/L 97(L) 97(L) 99(L)  CO2 22 - 32 mmol/L 30 27 29   Calcium 8.9 - 10.3 mg/dL 7.9(L) 7.4(L) 7.5(L)    CBC Latest Ref Rng & Units 11/24/2017 11/22/2017 11/21/2017  WBC 3.6 - 11.0 K/uL 5.7 6.7 7.6  Hemoglobin 12.0 - 16.0 g/dL 8.2(L) 8.8(L) 9.0(L)  Hematocrit 35.0 - 47.0 % 26.2(L) 28.1(L) 28.2(L)  Platelets 150 - 440 K/uL 252 262 265    CXR  11/20/17:  Ptchy B infiltrates  CT chest 11/20/17: diffuse patchy bilateral infiltrates with upper lobe predominance  Assessment 1. Former smoker 2. COPD 3. Chronic hypoxemic respiratory failure 4. Hemoptysis - this has been intermittently ongoing for some time but acutely worsened prompting ED eval. Most likely, it is due to underlying chronic bronchitis with superimposed airway hyperemia due to volume overload in setting of ESRD and CHF. However, an endobronchial tumor could be present. Chest CT does not image airways sufficiently to rule this out.  5.hypercapnia-with COPD   PLAN:  -Nocturnal BiPAP -BD therapy -continue steroids and Iv abx -Avoid sedatives  -Resume all home medications  Patient with multiple admission for acute on chronic hypercapnic resp failure secondary to volume overload.  Patient is not adherent with fluid restriction recommendations.  Usually she improves post dialysis and BiPAP.  If her respiratory failure persists she may benefit from long term trach.  This will be addressed with patient prior to discharge Patient is stable for transfer out of the ICU  Kloe Oates S. [facility] ANP-BC Pulmonary and Critical Care Medicine [facility] Pager [phone] or [phone]

## 2017-11-24 NOTE — Discharge Summary (Signed)
[name] [place] NAME: Michele Nash    MR#:  [id number]  DATE OF BIRTH:  1956-05-07  DATE OF ADMISSION:  11/20/2017 ADMITTING PHYSICIAN: Bettey Costa, MD  DATE OF DISCHARGE: 11/24/2017  PRIMARY CARE PHYSICIAN: [name], MD    ADMISSION DIAGNOSIS:  Hemoptysis [R04.2]  DISCHARGE DIAGNOSIS:  Active Problems:   Hemoptysis   SECONDARY DIAGNOSIS:   Past Medical History:  Diagnosis Date  . Altered mental status   . Anemia   . Apnea, sleep    for sleep study 10/17/15-no cpap yet  . Arthritis   . Bronchitis   . CHF (congestive heart failure) ([place])   . Chronic kidney disease   . COPD (chronic obstructive pulmonary disease) ([place])   . Depression   . Dialysis patient ([place]) 2014  . GERD (gastroesophageal reflux disease)   . GIB (gastrointestinal bleeding) 07/09/2016  . Headache   . Hyperlipidemia   . Hypertension   . Obesity   . Pulmonary edema 09/14/2016  . Renal dialysis device, implant, or graft complication    RIGHT CHEST CATH  . Renal insufficiency   . Respiratory failure ([place]) 07/23/2016  . Traumatic hematoma of forehead   . Type II diabetes mellitus ([place]) 09/21/2009   diet controlled    HOSPITAL COURSE:   61 year old female with history of end-stage renal disease on hemodialysis, chronic diastolic heart failure and chronic respiratory failure due to COPD on 4 L of oxygen who presents with hemoptysis.  1. Hemoptysis: CT scan does not show evidence of pulmonary emboli however it is concerning for increasing dense reticular and alveolar opacities within the upper lobes concerning for possible pulmonary hemorrhage versus interstitial lung disease. Hemoptysis has resolved. She is about by pulmonary who is recommending prednisone 40 mg for 4 more days And to continue Levaquin for treatment of pneumonia for 4 more days Her hemoglobin has remained stable Needs repeat CXR/CT in 3-4 weeks Will follow up with Dr Shaune Pascal in 3  weeks  2. Acute encephalopathy due to CO2 narcosis with hypoxic hypercarbic respiratory failure: Patient's mental status is back to baseline. She was placed on BiPAP which seemed to resolve her symptoms.    3. Acute on chronic anemia from chronic disease Hemagglutinin and has remained stable. She did not require blood transfusion during this hospital stay  4 End-stage renal disease on hemodialysis:Continue HD as per her normal schedule.  5 Chronic hypoxic respiratory failure due to COPD: She will continue prednisone 40 mg for 4 days. Continue inhalers.   6 Essential hypertension: Continue Norvasc, Coreg, hydralazine, HCTZ, losartan  7 Chronic diastolic heart failure without signs of exacerbation: Continue Torsemide  8. Obstructive sleep apnea: Patient is encouraged to use CPAP at night    DISCHARGE CONDITIONS AND DIET:   Stable Renal diet  CONSULTS OBTAINED:    DRUG ALLERGIES:   Allergies  Allergen Reactions  . Sulfa Antibiotics Itching, Swelling, Rash and Other (See Comments)    Reaction:  Facial/body swelling     DISCHARGE MEDICATIONS:   Allergies as of 11/24/2017      Reactions   Sulfa Antibiotics Itching, Swelling, Rash, Other (See Comments)   Reaction:  Facial/body swelling       Medication List    STOP taking these medications   hydrochlorothiazide 25 MG tablet Commonly known as:  HYDRODIURIL     TAKE these medications   albuterol 108 (90 Base) MCG/ACT inhaler Commonly known as:  PROVENTIL HFA;VENTOLIN HFA Inhale 2 puffs  every 6 (six) hours as needed into the lungs for wheezing or shortness of breath.   ALPRAZolam 0.5 MG tablet Commonly known as:  XANAX Take 0.5 mg by mouth 2 (two) times daily.   amLODipine 10 MG tablet Commonly known as:  NORVASC Take 10 mg by mouth daily.   calcium acetate 667 MG capsule Commonly known as:  PHOSLO Take 3 capsules (2,001 mg total) by mouth 3 (three) times daily with meals.   carvedilol 12.5 MG  tablet Commonly known as:  COREG Take 12.5 mg by mouth 2 (two) times daily with a meal.   cinacalcet 30 MG tablet Commonly known as:  SENSIPAR Take 60 mg by mouth daily.   ezetimibe 10 MG tablet Commonly known as:  ZETIA Take 10 mg by mouth daily.   feeding supplement (PRO-STAT SUGAR FREE 64) Liqd Take 30 mLs by mouth daily.   fluticasone 50 MCG/ACT nasal spray Commonly known as:  FLONASE Place 2 sprays into both nostrils daily as needed for rhinitis.   furosemide 40 MG tablet Commonly known as:  LASIX Take 40 mg by mouth daily.   hydrALAZINE 50 MG tablet Commonly known as:  APRESOLINE Take 2 tablets (100 mg total) by mouth 3 (three) times daily.   ipratropium-albuterol 0.5-2.5 (3) MG/3ML Soln Commonly known as:  DUONEB Take 3 mLs by nebulization every 6 (six) hours as needed.   levofloxacin 250 MG tablet Commonly known as:  LEVAQUIN Take 1 tablet (250 mg total) by mouth every other day for 4 days. Start taking on:  11/26/2017   lidocaine-prilocaine cream Commonly known as:  EMLA Apply 1 application topically as needed (prior to accessing port).   losartan 100 MG tablet Commonly known as:  COZAAR Take 100 mg by mouth daily.   montelukast 10 MG tablet Commonly known as:  SINGULAIR Take 1 tablet (10 mg total) by mouth at bedtime.   multivitamin Tabs tablet Take 1 tablet by mouth at bedtime.   ondansetron 4 MG tablet Commonly known as:  ZOFRAN Take 1 tablet (4 mg total) by mouth every 8 (eight) hours as needed for nausea or vomiting.   pantoprazole 40 MG tablet Commonly known as:  PROTONIX Take 40 mg by mouth 2 (two) times daily.   predniSONE 20 MG tablet Commonly known as:  DELTASONE Take 2 tablets (40 mg total) by mouth daily with breakfast for 5 days.   sennosides-docusate sodium 8.6-50 MG tablet Commonly known as:  SENOKOT-S Take 1 tablet by mouth at bedtime as needed for constipation.   sertraline 50 MG tablet Commonly known as:  ZOLOFT Take 50 mg  by mouth daily.   torsemide 100 MG tablet Commonly known as:  DEMADEX Take 1 tablet (100 mg total) by mouth daily.   traMADol 50 MG tablet Commonly known as:  ULTRAM Take 1 tablet (50 mg total) by mouth every 12 (twelve) hours as needed for severe pain.         Today   CHIEF COMPLAINT:  Patient did well on BiPAP. She was taken off of BiPAP and placed back on her nasal cannula yesterday. She is on CPAP overnight for her obstructive sleep apnea. Patient would like to be discharged today.   VITAL SIGNS:  Blood pressure 121/63, pulse 70, temperature 97.6 F (36.4 C), temperature source Axillary, resp. rate 19, height 5\' 5"  (1.651 m), weight 59 kg (130 lb 1.1 oz), SpO2 96 %.   REVIEW OF SYSTEMS:  Review of Systems  Constitutional: Negative.  Negative for  chills, fever and malaise/fatigue.  HENT: Negative.  Negative for ear discharge, ear pain, hearing loss, nosebleeds and sore throat.   Eyes: Negative.  Negative for blurred vision and pain.  Respiratory: Negative.  Negative for cough, hemoptysis, shortness of breath and wheezing.   Cardiovascular: Negative.  Negative for chest pain, palpitations and leg swelling.  Gastrointestinal: Negative.  Negative for abdominal pain, blood in stool, diarrhea, nausea and vomiting.  Genitourinary: Negative.  Negative for dysuria.  Musculoskeletal: Negative.  Negative for back pain.  Skin: Negative.   Neurological: Negative for dizziness, tremors, speech change, focal weakness, seizures and headaches.  Endo/Heme/Allergies: Negative.  Does not bruise/bleed easily.  Psychiatric/Behavioral: Negative.  Negative for depression, hallucinations and suicidal ideas.     PHYSICAL EXAMINATION:  GENERAL:  62 y.o.-year-old patient lying in the bed with no acute distress.  NECK:  Supple, no jugular venous distention. No thyroid enlargement, no tenderness.  LUNGS: Normal breath sounds bilaterally, no wheezing, rales,rhonchi  No use of accessory muscles of  respiration.  CARDIOVASCULAR: S1, S2 normal. No murmurs, rubs, or gallops.  ABDOMEN: Soft, non-tender, non-distended. Bowel sounds present. No organomegaly or mass.  EXTREMITIES: No pedal edema, cyanosis, or clubbing.  PSYCHIATRIC: The patient is alert and oriented x 3.  SKIN: No obvious rash, lesion, or ulcer.   DATA REVIEW:   CBC Recent Labs  Lab 11/24/17 0318  WBC 5.7  HGB 8.2*  HCT 26.2*  PLT 252    Chemistries  Recent Labs  Lab 11/24/17 0318  NA 137  K 5.0  CL 97*  CO2 30  GLUCOSE 108*  BUN 46*  CREATININE 6.63*  CALCIUM 7.9*  MG 1.6*    Cardiac Enzymes Recent Labs  Lab 11/20/17 1515  TROPONINI 0.14*    Microbiology Results  @MICRORSLT48 @  RADIOLOGY:  Dg Chest 1 View  Result Date: 11/23/2017 CLINICAL DATA:  Fever and lethargy. End-stage renal disease. Heart failure. COPD. EXAM: CHEST 1 VIEW COMPARISON:  11/20/2017 plain film and CT. FINDINGS: Dialysis catheter tip at low SVC. Midline trachea. Moderate cardiomegaly. Tortuous thoracic aorta. No pleural effusion or pneumothorax. Interstitial and airspace disease is significantly improved, mild. Slightly greater on the left than right. IMPRESSION: Cardiomegaly with improved interstitial and airspace opacities bilaterally. Favor resolving pulmonary edema versus infection. Electronically Signed   By: Abigail Miyamoto M.D.   On: 11/23/2017 10:37      Allergies as of 11/24/2017      Reactions   Sulfa Antibiotics Itching, Swelling, Rash, Other (See Comments)   Reaction:  Facial/body swelling       Medication List    STOP taking these medications   hydrochlorothiazide 25 MG tablet Commonly known as:  HYDRODIURIL     TAKE these medications   albuterol 108 (90 Base) MCG/ACT inhaler Commonly known as:  PROVENTIL HFA;VENTOLIN HFA Inhale 2 puffs every 6 (six) hours as needed into the lungs for wheezing or shortness of breath.   ALPRAZolam 0.5 MG tablet Commonly known as:  XANAX Take 0.5 mg by mouth 2 (two)  times daily.   amLODipine 10 MG tablet Commonly known as:  NORVASC Take 10 mg by mouth daily.   calcium acetate 667 MG capsule Commonly known as:  PHOSLO Take 3 capsules (2,001 mg total) by mouth 3 (three) times daily with meals.   carvedilol 12.5 MG tablet Commonly known as:  COREG Take 12.5 mg by mouth 2 (two) times daily with a meal.   cinacalcet 30 MG tablet Commonly known as:  SENSIPAR Take 60  mg by mouth daily.   ezetimibe 10 MG tablet Commonly known as:  ZETIA Take 10 mg by mouth daily.   feeding supplement (PRO-STAT SUGAR FREE 64) Liqd Take 30 mLs by mouth daily.   fluticasone 50 MCG/ACT nasal spray Commonly known as:  FLONASE Place 2 sprays into both nostrils daily as needed for rhinitis.   furosemide 40 MG tablet Commonly known as:  LASIX Take 40 mg by mouth daily.   hydrALAZINE 50 MG tablet Commonly known as:  APRESOLINE Take 2 tablets (100 mg total) by mouth 3 (three) times daily.   ipratropium-albuterol 0.5-2.5 (3) MG/3ML Soln Commonly known as:  DUONEB Take 3 mLs by nebulization every 6 (six) hours as needed.   levofloxacin 250 MG tablet Commonly known as:  LEVAQUIN Take 1 tablet (250 mg total) by mouth every other day for 4 days. Start taking on:  11/26/2017   lidocaine-prilocaine cream Commonly known as:  EMLA Apply 1 application topically as needed (prior to accessing port).   losartan 100 MG tablet Commonly known as:  COZAAR Take 100 mg by mouth daily.   montelukast 10 MG tablet Commonly known as:  SINGULAIR Take 1 tablet (10 mg total) by mouth at bedtime.   multivitamin Tabs tablet Take 1 tablet by mouth at bedtime.   ondansetron 4 MG tablet Commonly known as:  ZOFRAN Take 1 tablet (4 mg total) by mouth every 8 (eight) hours as needed for nausea or vomiting.   pantoprazole 40 MG tablet Commonly known as:  PROTONIX Take 40 mg by mouth 2 (two) times daily.   predniSONE 20 MG tablet Commonly known as:  DELTASONE Take 2 tablets (40  mg total) by mouth daily with breakfast for 5 days.   sennosides-docusate sodium 8.6-50 MG tablet Commonly known as:  SENOKOT-S Take 1 tablet by mouth at bedtime as needed for constipation.   sertraline 50 MG tablet Commonly known as:  ZOLOFT Take 50 mg by mouth daily.   torsemide 100 MG tablet Commonly known as:  DEMADEX Take 1 tablet (100 mg total) by mouth daily.   traMADol 50 MG tablet Commonly known as:  ULTRAM Take 1 tablet (50 mg total) by mouth every 12 (twelve) hours as needed for severe pain.          Management plans discussed with the patient and she is in agreement. Stable for discharge   Patient should follow up with pcp  CODE STATUS:     Code Status Orders  (From admission, onward)        Start     Ordered   11/20/17 2138  Full code  Continuous     11/20/17 2138    Code Status History    Date Active Date Inactive Code Status Order ID Comments User Context   10/13/2017 08:00 10/15/2017 18:44 Full Code [id number]  Harrie Foreman, MD Inpatient   10/05/2017 23:59 10/07/2017 17:45 Full Code [id number]  Gorden Harms, MD Inpatient   08/11/2017 17:56 08/13/2017 16:15 Full Code [id number]  Vaughan Basta, MD Inpatient   06/15/2017 19:42 06/20/2017 16:52 Full Code [id number]  Idelle Crouch, MD Inpatient   05/21/2017 17:58 05/22/2017 17:24 Full Code [id number]  Nicholes Mango, MD Inpatient   12/04/2016 09:55 12/11/2016 17:41 Full Code [id number]  Fritzi Mandes, MD Inpatient   10/29/2016 12:14 10/30/2016 19:43 Full Code [id number]  Laverle Hobby, MD ED   10/06/2016 20:33 10/11/2016 15:54 Full Code [id number]  Mikael Spray, NP ED   09/14/2016 09:19 09/16/2016 16:07 Full  Code [id number]  Laverle Hobby, MD ED   07/23/2016 11:05 07/24/2016 22:49 Full Code [id number]  Wilhelmina Mcardle, MD ED   07/09/2016 18:43 07/13/2016 17:23 Full Code [id number]  Lytle Butte, MD ED   05/28/2016 14:54 06/02/2016 14:51 Full Code [id number]  Theodoro Grist, MD Inpatient    04/25/2016 18:04 04/26/2016 21:17 Full Code [id number]  Nicholes Mango, MD Inpatient   10/28/2015 18:50 10/30/2015 18:09 Full Code [id number]  Theodoro Grist, MD Inpatient   03/30/2015 00:51 03/31/2015 16:44 Full Code [id number]  Juluis Mire, MD Inpatient    Advance Directive Documentation     Most Recent Value  Type of Advance Directive  Living will  Pre-existing out of facility DNR order (yellow form or pink MOST form)  No data  "MOST" Form in Place?  No data      TOTAL TIME TAKING CARE OF THIS PATIENT: 38 minutes.    Note: This dictation was prepared with Dragon dictation along with smaller phrase technology. Any transcriptional errors that result from this process are unintentional.  Doc Mandala M.D on 11/24/2017 at 8:41 AM  Between 7am to 6pm - Pager - [phone] After 6pm go to [email or web address] - password [email or web address] [facility]  Office  [phone]  CC: Primary care physician; [name], MD

## 2017-11-24 NOTE — Progress Notes (Signed)
[facility], [place] 11/24/17  Subjective:   Patient is back to her baseline No shortness of breath She was able to eat this morning without nausea or vomiting   Objective:  Vital signs in last 24 hours:  Temp:  [97.6 F (36.4 C)-98.2 F (36.8 C)] 98.2 F (36.8 C) (12/30 0800) Pulse Rate:  [67-82] 72 (12/30 0844) Resp:  [16-24] 19 (12/30 0500) BP: (95-157)/(53-74) 117/72 (12/30 1001) SpO2:  [90 %-100 %] 96 % (12/30 0500) FiO2 (%):  [36 %] 36 % (12/29 2003)  Weight change: -2.6 kg (-11.7 oz) Filed Weights   11/22/17 0945 11/22/17 1430 11/23/17 1219  Weight: 61.6 kg (135 lb 12.9 oz) 57.3 kg (126 lb 5.2 oz) 59 kg (130 lb 1.1 oz)    Intake/Output:    Intake/Output Summary (Last 24 hours) at 11/24/2017 1228 Last data filed at 11/23/2017 2100 Gross per 24 hour  Intake 580 ml  Output -  Net 580 ml     Physical Exam: General: No acute distress, laying in the bed  HEENT Anicteric, moist oral mucous membranes  Neck supple  Pulm/lungs  clear to auscultation bilaterally  CVS/Heart  regular, no rub  Abdomen:  Soft, nontender  Extremities: Trace edema  Neurologic:  Alert and oriented,   Skin: No acute rashes  Access: IJ PermCath AV fistula       Basic Metabolic Panel:  Recent Labs  Lab 11/20/17 1515 11/21/17 0509 11/24/17 0318  NA 139 137 137  K 4.6 5.1 5.0  CL 99* 97* 97*  CO2 29 27 30   GLUCOSE 166* 99 108*  BUN 74* 79* 46*  CREATININE 9.71* 9.84* 6.63*  CALCIUM 7.5* 7.4* 7.9*  MG  --   --  1.6*  PHOS  --   --  5.8*     CBC: Recent Labs  Lab 11/20/17 1515 11/21/17 0509 11/22/17 0900 11/24/17 0318  WBC 6.7 7.6 6.7 5.7  HGB 8.5* 9.0* 8.8* 8.2*  HCT 26.9* 28.2* 28.1* 26.2*  MCV 104.3* 103.9* 104.7* 104.5*  PLT 260 265 262 252      Lab Results  Component Value Date   HEPBSAG Negative 10/06/2017   HEPBSAB Reactive 10/06/2017      Microbiology:  Recent Results (from the past 240 hour(s))  Culture, blood  (routine x 2)     Status: None (Preliminary result)   Collection Time: 11/20/17  5:34 PM  Result Value Ref Range Status   Specimen Description BLOOD RIGHT ANTECUBITAL  Final   Special Requests   Final    BOTTLES DRAWN AEROBIC AND ANAEROBIC Blood Culture adequate volume   Culture   Final    NO GROWTH 4 DAYS Performed at [facility], [address] [place], [place] [address]    Report Status PENDING  Incomplete  Culture, blood (routine x 2)     Status: None (Preliminary result)   Collection Time: 11/20/17  5:34 PM  Result Value Ref Range Status   Specimen Description BLOOD BLOOD RIGHT ARM  Final   Special Requests   Final    BOTTLES DRAWN AEROBIC AND ANAEROBIC Blood Culture adequate volume   Culture   Final    NO GROWTH 4 DAYS Performed at [facility], [address], [place], [place] [address]    Report Status PENDING  Incomplete  MRSA PCR Screening     Status: None   Collection Time: 11/20/17  9:52 PM  Result Value Ref Range Status   MRSA by PCR  NEGATIVE NEGATIVE Final    Comment:        The GeneXpert MRSA Assay (FDA approved for NASAL specimens only), is one component of a comprehensive MRSA colonization surveillance program. It is not intended to diagnose MRSA infection nor to guide or monitor treatment for MRSA infections. Performed at [facility], [address] [place], [place] [address]   Culture, blood (Routine X 2) w Reflex to ID Panel     Status: None (Preliminary result)   Collection Time: 11/23/17 10:22 AM  Result Value Ref Range Status   Specimen Description BLOOD RT HAND  Final   Special Requests   Final    BOTTLES DRAWN AEROBIC AND ANAEROBIC Blood Culture adequate volume   Culture   Final    NO GROWTH < 24 HOURS Performed at [facility], [address] [place], [place] [address]    Report Status PENDING  Incomplete  Culture, blood (Routine X 2) w Reflex to ID Panel     Status: None (Preliminary result)    Collection Time: 11/23/17 10:22 AM  Result Value Ref Range Status   Specimen Description BLOOD RT [facility]  Final   Special Requests   Final    BOTTLES DRAWN AEROBIC AND ANAEROBIC Blood Culture adequate volume   Culture   Final    NO GROWTH < 24 HOURS Performed at [facility], [address] [place], [place] [address]    Report Status PENDING  Incomplete    Coagulation Studies: No results for input(s): LABPROT, INR in the last 72 hours.  Urinalysis: No results for input(s): COLORURINE, LABSPEC, PHURINE, GLUCOSEU, HGBUR, BILIRUBINUR, KETONESUR, PROTEINUR, UROBILINOGEN, NITRITE, LEUKOCYTESUR in the last 72 hours.  Invalid input(s): APPERANCEUR    Imaging: Dg Chest 1 View  Result Date: 11/23/2017 CLINICAL DATA:  Fever and lethargy. End-stage renal disease. Heart failure. COPD. EXAM: CHEST 1 VIEW COMPARISON:  11/20/2017 plain film and CT. FINDINGS: Dialysis catheter tip at low SVC. Midline trachea. Moderate cardiomegaly. Tortuous thoracic aorta. No pleural effusion or pneumothorax. Interstitial and airspace disease is significantly improved, mild. Slightly greater on the left than right. IMPRESSION: Cardiomegaly with improved interstitial and airspace opacities bilaterally. Favor resolving pulmonary edema versus infection. Electronically Signed   By: Abigail Miyamoto M.D.   On: 11/23/2017 10:37     Medications:   . levofloxacin (LEVAQUIN) IV Stopped (11/23/17 7591)   . ALPRAZolam  0.5 mg Oral BID  . amLODipine  10 mg Oral Daily  . budesonide (PULMICORT) nebulizer solution  0.5 mg Nebulization BID  . calcium acetate  2,001 mg Oral TID WC  . carvedilol  12.5 mg Oral BID WC  . cinacalcet  60 mg Oral Q breakfast  . ezetimibe  10 mg Oral Daily  . feeding supplement (PRO-STAT SUGAR FREE 64)  30 mL Oral Daily  . hydrALAZINE  100 mg Oral TID  . ipratropium-albuterol  3 mL Nebulization Q4H  . losartan  100 mg Oral Daily  . methylPREDNISolone (SOLU-MEDROL) injection  60 mg Intravenous  Q24H  . multivitamin  1 tablet Oral QHS  . pantoprazole  40 mg Oral BID  . sertraline  50 mg Oral Daily  . torsemide  100 mg Oral Daily   acetaminophen **OR** acetaminophen, diphenhydrAMINE, fluticasone, ibuprofen, ipratropium-albuterol, lidocaine-prilocaine, ondansetron, senna-docusate, traMADol  Assessment/ Plan:  61 y.o. female with end stage renal disease on hemodialysis, hypertension, congestive heart failure, COPD/tobacco abuse, diabetes mellitus type II, depression, obstructive sleep apnea, peripheral vascular disease,  [facility] 58.5 Kg  1.  End-stage renal disease 2.  Anemia of chronic kidney disease 3.  Secondary hyperparathyroidism 4.  Hemopytsis   Next hemodialysis planned for Monday, confirmed patient chair time with outpatient dialysis [place] with hemodialysis  Continue home dose of calcium acetate Respiratory status has improved significantly; Prednisone x 5 days    LOS: [place] 12/30/201812:28 PM  [place], [place]

## 2017-11-24 NOTE — Care Management Note (Addendum)
Case Management Note  Patient Details  Name: Michele Nash MRN: [id number] Date of Birth: Nov 20, 1956  Subjective/Objective:    Discussed discharge planning with Michele Nash who refused offer of home health services. Michele Nash agreed to having Nash RW delivered to her in ICU today before she leaves for home.  Call to [facility] requesting delivery of Nash RW to Michele Nash today at [facility].                Action/Plan:   Expected Discharge Date:  11/24/17               Expected Discharge Plan:     In-House Referral:     Discharge planning Services     Post Acute Care Choice:    Choice offered to:     DME Arranged:    DME Agency:     HH Arranged:    HH Agency:     Status of Service:     If discussed at [employer], dates discussed:    Additional Comments:  Michele Casselman A, RN 11/24/2017, 9:50 AM

## 2017-11-24 NOTE — Progress Notes (Signed)
Husband with walker at [facility].  DC instructions rev'd with pt and husband.  Prescriptions given.  Home 02 tank with pt, cont on 4L.  Left via w/c to home with husband and paperwork listing meds and f/u appts.  Bubba Camp, RN

## 2017-11-24 NOTE — Clinical Social Work Note (Signed)
CSW met with the patient at bedside to discuss discharge planning. The patient has declined STR and is willing to accept a walker. The CSW has updated the RNCM and the attending RN. CSW is signing off. Please consult should further needs arise.  Karen Martha White, MSW, LCSWA [phone] 

## 2017-11-25 LAB — CULTURE, BLOOD (ROUTINE X 2)
CULTURE: NO GROWTH
Culture: NO GROWTH
SPECIAL REQUESTS: ADEQUATE
Special Requests: ADEQUATE

## 2017-11-28 ENCOUNTER — Encounter (INDEPENDENT_AMBULATORY_CARE_PROVIDER_SITE_OTHER): Payer: Self-pay

## 2017-11-28 ENCOUNTER — Other Ambulatory Visit: Payer: Self-pay | Admitting: *Deleted

## 2017-11-28 DIAGNOSIS — J449 Chronic obstructive pulmonary disease, unspecified: Secondary | ICD-10-CM

## 2017-11-28 LAB — CULTURE, BLOOD (ROUTINE X 2)
CULTURE: NO GROWTH
CULTURE: NO GROWTH
Special Requests: ADEQUATE
Special Requests: ADEQUATE

## 2017-11-28 NOTE — Progress Notes (Signed)
CXR order placed per provider for hosp f/u

## 2017-12-02 ENCOUNTER — Other Ambulatory Visit (INDEPENDENT_AMBULATORY_CARE_PROVIDER_SITE_OTHER): Payer: Self-pay | Admitting: Vascular Surgery

## 2017-12-05 ENCOUNTER — Ambulatory Visit: Admission: RE | Admit: 2017-12-05 | Payer: Medicare Other | Source: Ambulatory Visit | Admitting: Vascular Surgery

## 2017-12-05 SURGERY — DIALYSIS/PERMA CATHETER REMOVAL
Anesthesia: LOCAL

## 2017-12-10 LAB — BLOOD GAS, ARTERIAL
Acid-Base Excess: 6.1 mmol/L — ABNORMAL HIGH (ref 0.0–2.0)
BICARBONATE: 34.5 mmol/L — AB (ref 20.0–28.0)
FIO2: 0.36
O2 SAT: 53.4 %
PATIENT TEMPERATURE: 37
pCO2 arterial: 67 mmHg (ref 32.0–48.0)
pH, Arterial: 7.32 — ABNORMAL LOW (ref 7.350–7.450)

## 2017-12-27 DEATH — deceased

## 2018-01-11 IMAGING — DX DG ABDOMEN 1V
1 series · 2 of 2 positions shown · non-contrast
Comparison: CT scan 05/24/2016.

CLINICAL DATA: New onset abdominal pain

EXAM:
ABDOMEN - 1 VIEW

[Series 1: abdomen kub · 0.14mm/px · 2 of 2 slices shown]
[im 1/2]
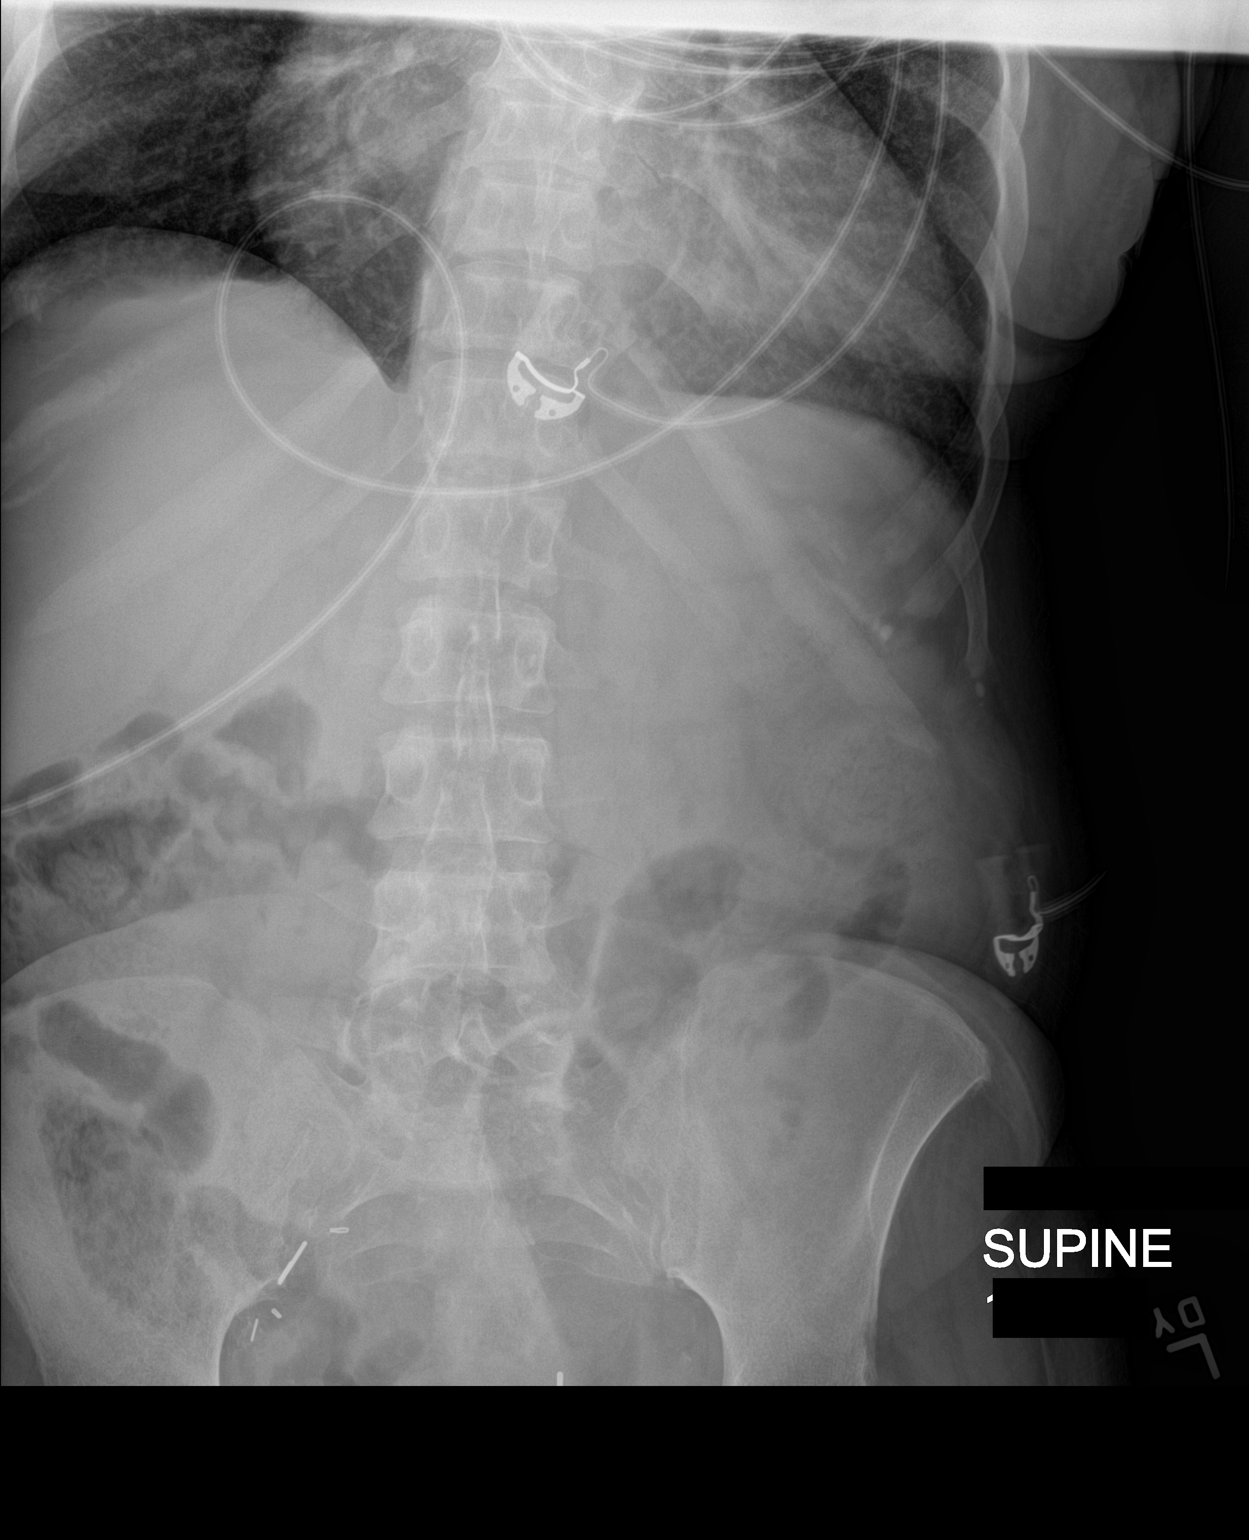
[im 2/2]
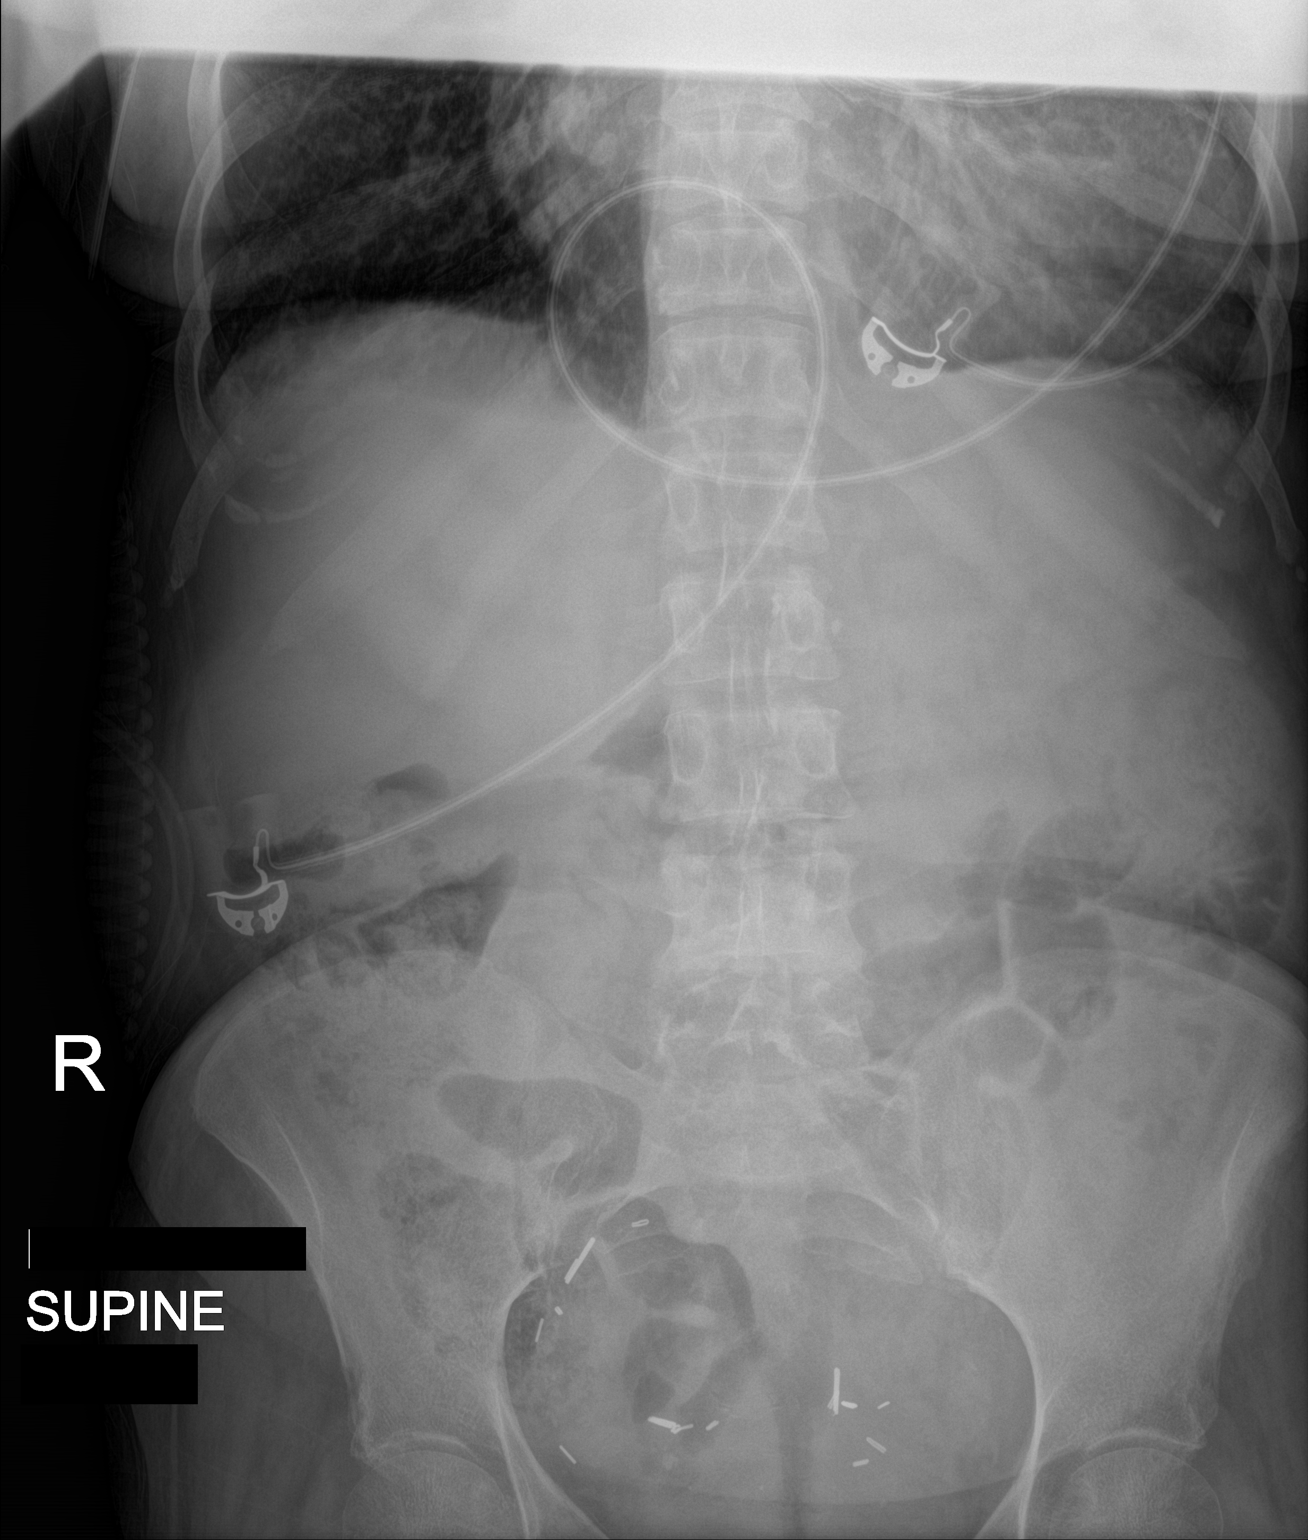

[2 of 2 positions shown; findings below may reference images not displayed]

FINDINGS: 5222 hours. Supine portable view the abdomen shows no gaseous bowel
dilatation to suggest obstruction. Multiple surgical clips are noted
over the anatomic pelvis. No unexpected abdominal pelvic
calcification. Visualized bony anatomy is unremarkable.
IMPRESSION: Negative.

## 2018-01-11 IMAGING — DX DG CHEST 1V PORT
1 series · 1 of 1 positions shown · non-contrast
Comparison: Portable exam 7014 hours compared to 07/24/2016

CLINICAL DATA: Difficulty breathing, dyspnea, COPD, hypertension,
chronic kidney disease analysis, GERD

EXAM:
PORTABLE CHEST 1 VIEW

[chest ap]
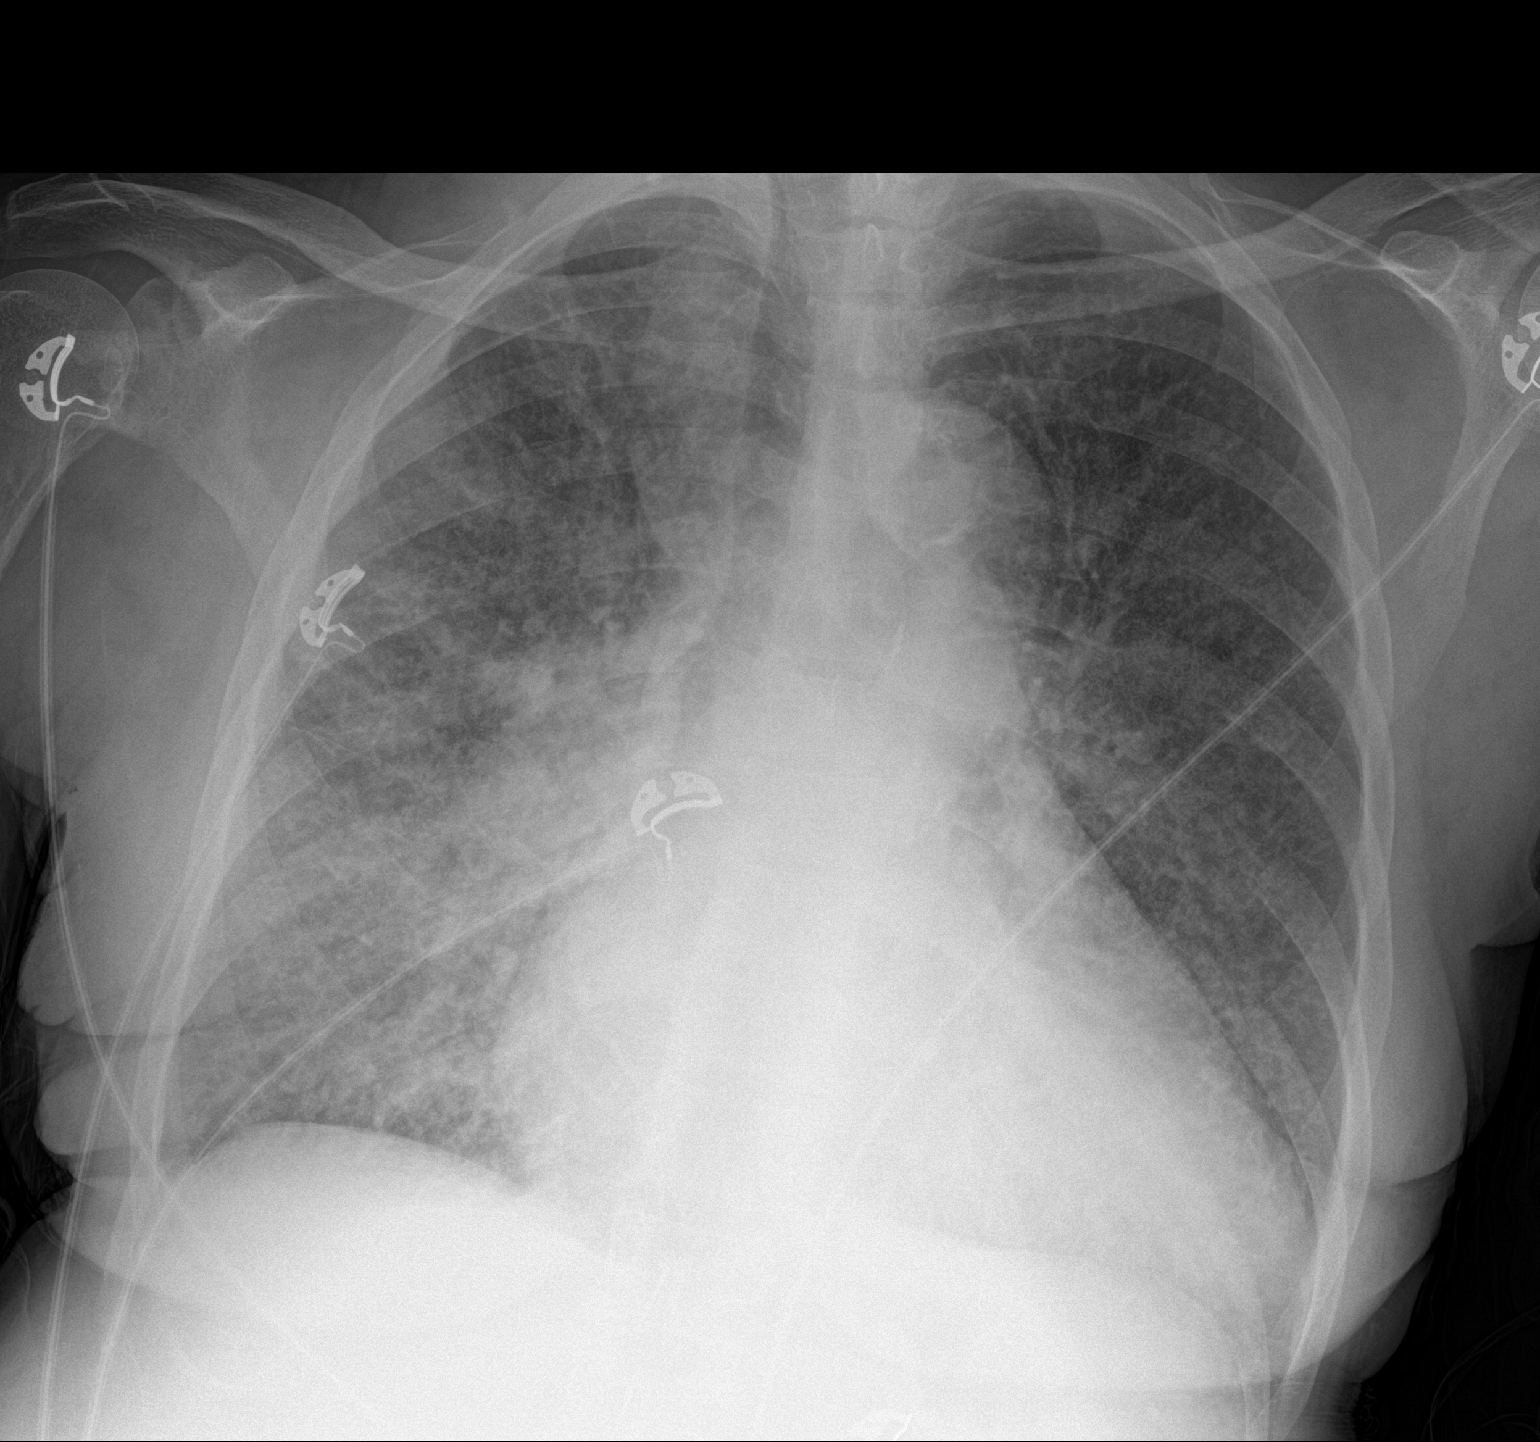

[1 of 1 positions shown; findings below may reference images not displayed]

FINDINGS: Enlargement of cardiac silhouette with pulmonary vascular
congestion.

Calcified tortuous thoracic aorta.

Diffuse interstitial infiltrates likely pulmonary edema RIGHT
greater than LEFT, increased since previous exam.

No gross pleural effusion or pneumothorax.
IMPRESSION: Increased pulmonary edema.

## 2018-03-25 ENCOUNTER — Ambulatory Visit: Payer: Medicare Other | Admitting: Family

## 2018-03-25 IMAGING — DX DG FOOT COMPLETE 3+V*L*
3 series · 3 of 3 positions shown · non-contrast
Comparison: Left ankle series 06/29/2009.

CLINICAL DATA: 60-year-old female status post blunt trauma, kicked
coffee table this morning. Pain swelling bruising and redness
especially at the dorsal foot near the first metatarsal. Initial
encounter.

EXAM:
LEFT FOOT - COMPLETE 3+ VIEW

[foot ap]
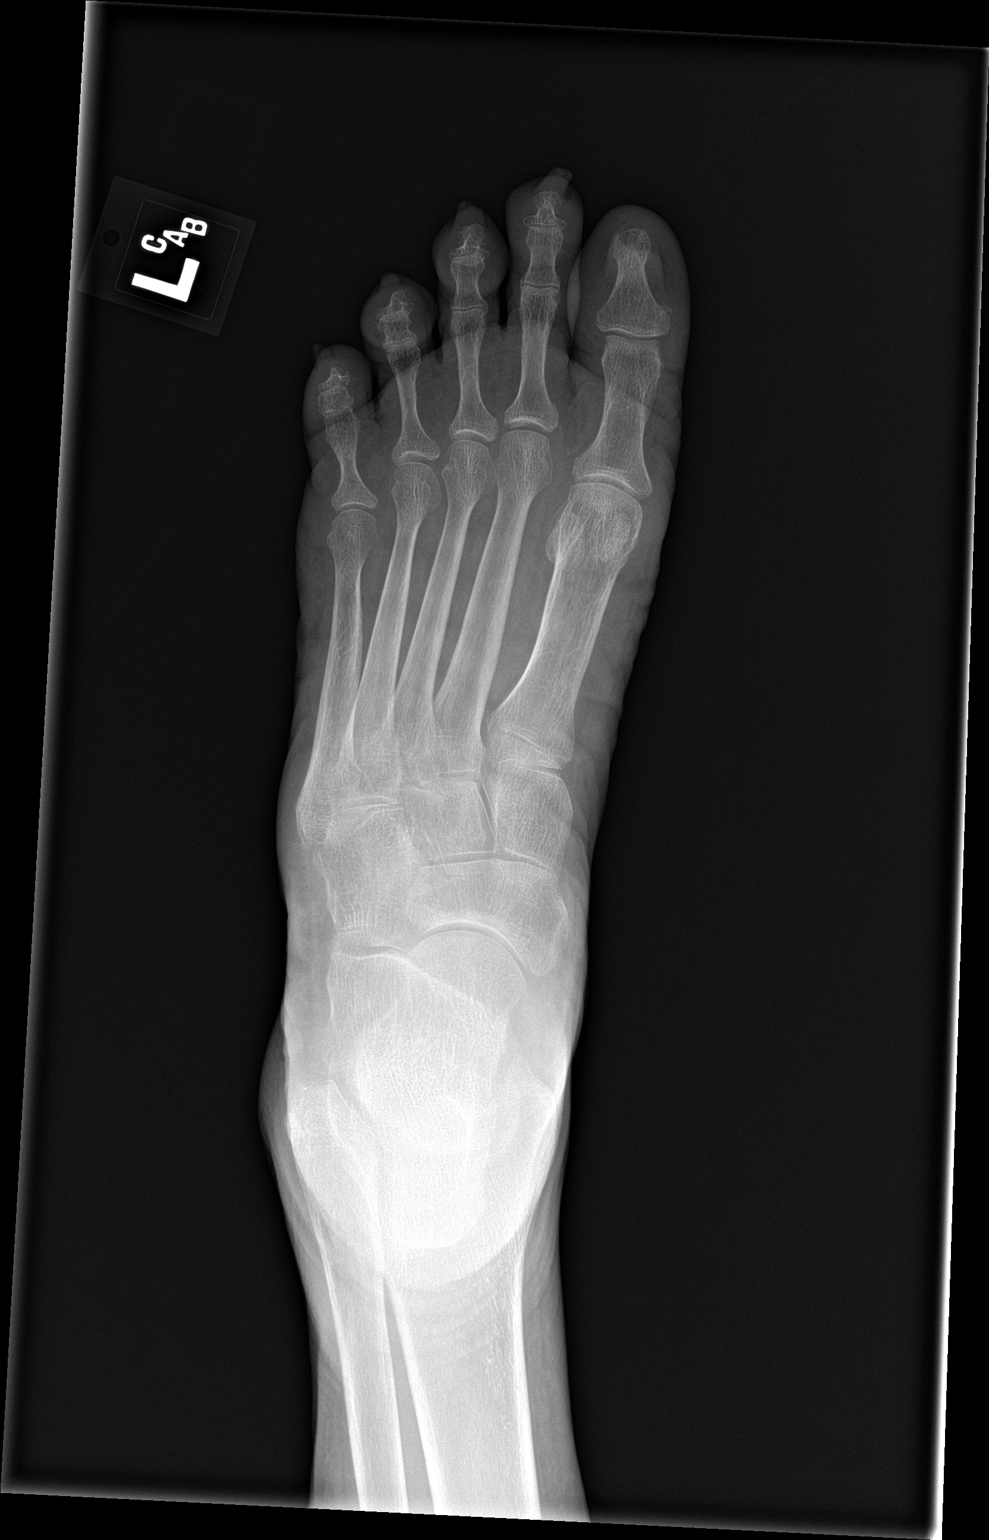

[foot obl]
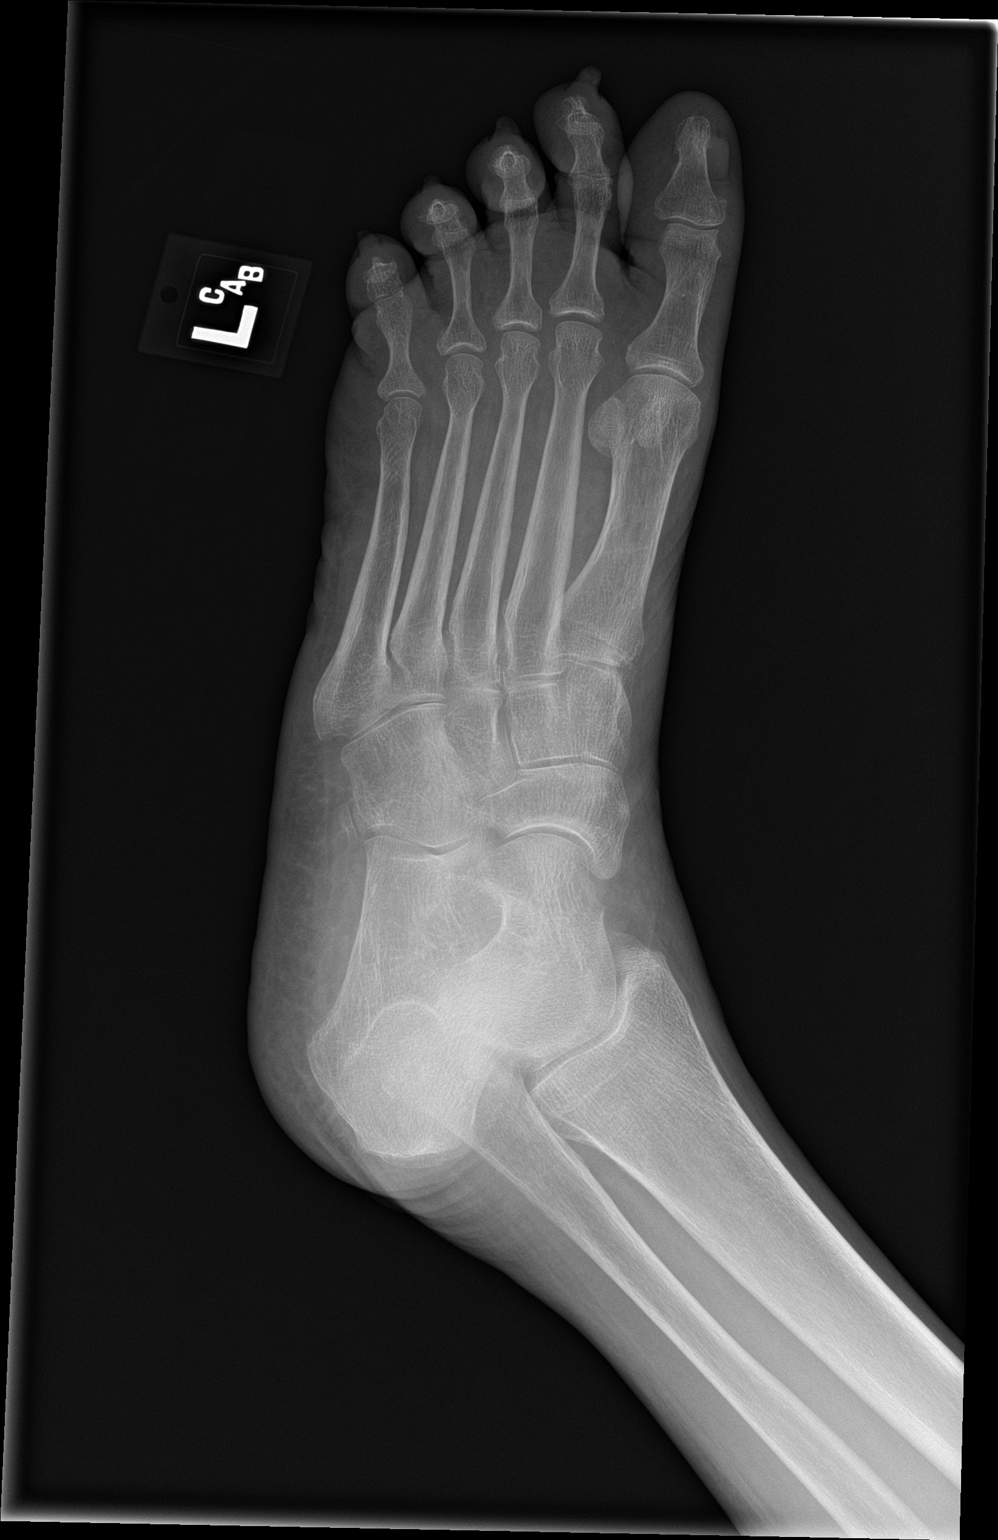

[foot lat]
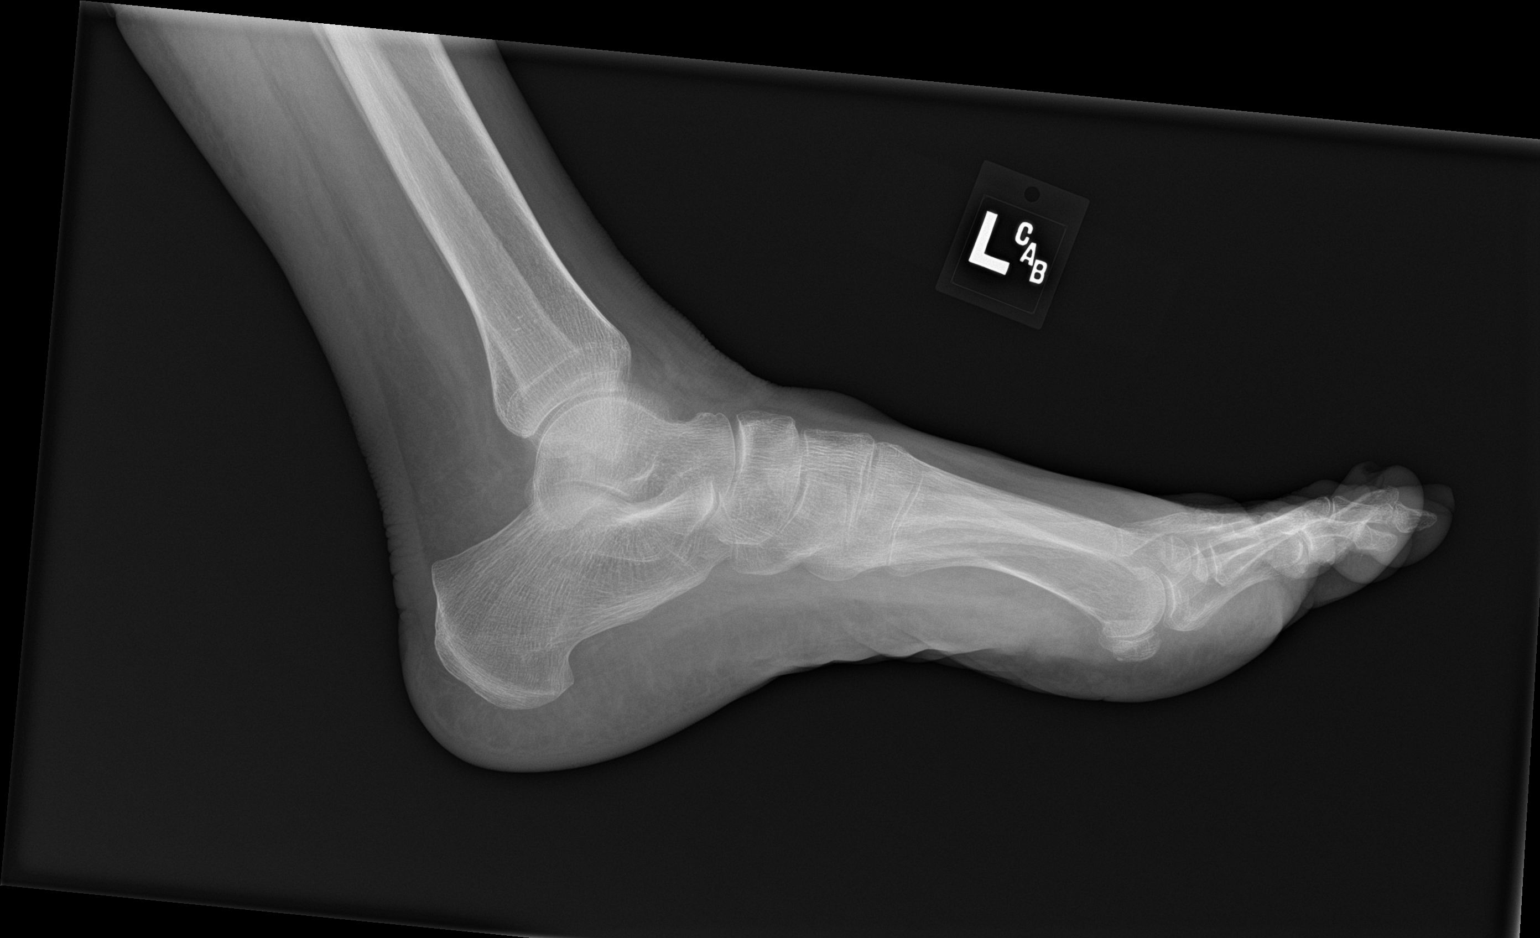

[3 of 3 positions shown; findings below may reference images not displayed]

FINDINGS: Bone mineralization is within normal limits for age. The first
phalanges and first metatarsal appear intact. There is mild first
MTP joint space loss which appears chronic.

Questionable cortical irregularity at the lateral aspect of the
fourth proximal phalanx at the metadiaphysis (image 2 arrow). This
might be artifact. The other phalanges and metatarsals appear
intact.

No tarsal bone fracture identified. Calcaneus appears stable and
intact. No subcutaneous gas.
IMPRESSION: 1. No fracture or dislocation identified in the left first ray.
2. Questionable nondisplaced fracture at the base of the fourth
proximal phalanx; if there is not point tenderness at that site then
consider this finding artifact.
3. No other acute fracture or dislocation identified in the left
foot.
